# Patient Record
Sex: Female | Born: 1935 | Race: Black or African American | Hispanic: No | Marital: Single | Smoking: Former smoker
Health system: Southern US, Community
[De-identification: ages and names within clinical notes are randomized; demographics above are authoritative.]

## PROBLEM LIST (undated history)

## (undated) DIAGNOSIS — N183 Chronic kidney disease, stage 3 unspecified: Secondary | ICD-10-CM

## (undated) DIAGNOSIS — D649 Anemia, unspecified: Secondary | ICD-10-CM

## (undated) DIAGNOSIS — I10 Essential (primary) hypertension: Secondary | ICD-10-CM

## (undated) DIAGNOSIS — I639 Cerebral infarction, unspecified: Secondary | ICD-10-CM

## (undated) DIAGNOSIS — I358 Other nonrheumatic aortic valve disorders: Secondary | ICD-10-CM

## (undated) DIAGNOSIS — E7800 Pure hypercholesterolemia, unspecified: Secondary | ICD-10-CM

## (undated) DIAGNOSIS — R0600 Dyspnea, unspecified: Secondary | ICD-10-CM

## (undated) DIAGNOSIS — I5189 Other ill-defined heart diseases: Secondary | ICD-10-CM

## (undated) DIAGNOSIS — K219 Gastro-esophageal reflux disease without esophagitis: Secondary | ICD-10-CM

## (undated) DIAGNOSIS — R0609 Other forms of dyspnea: Secondary | ICD-10-CM

## (undated) DIAGNOSIS — I209 Angina pectoris, unspecified: Secondary | ICD-10-CM

## (undated) DIAGNOSIS — E119 Type 2 diabetes mellitus without complications: Secondary | ICD-10-CM

## (undated) DIAGNOSIS — H5440 Blindness, one eye, unspecified eye: Secondary | ICD-10-CM

## (undated) DIAGNOSIS — F329 Major depressive disorder, single episode, unspecified: Secondary | ICD-10-CM

## (undated) DIAGNOSIS — C029 Malignant neoplasm of tongue, unspecified: Secondary | ICD-10-CM

## (undated) DIAGNOSIS — M1990 Unspecified osteoarthritis, unspecified site: Secondary | ICD-10-CM

## (undated) DIAGNOSIS — S72009A Fracture of unspecified part of neck of unspecified femur, initial encounter for closed fracture: Secondary | ICD-10-CM

## (undated) DIAGNOSIS — F32A Depression, unspecified: Secondary | ICD-10-CM

## (undated) DIAGNOSIS — Z970 Presence of artificial eye: Secondary | ICD-10-CM

## (undated) DIAGNOSIS — F419 Anxiety disorder, unspecified: Secondary | ICD-10-CM

## (undated) HISTORY — PX: FRACTURE SURGERY: SHX138

## (undated) HISTORY — PX: TUBAL LIGATION: SHX77

## (undated) HISTORY — PX: ENUCLEATION: SHX628

## (undated) HISTORY — DX: Anemia, unspecified: D64.9

---

## 1996-01-14 HISTORY — PX: TONGUE BIOPSY: SHX1075

## 1997-01-13 DIAGNOSIS — Z970 Presence of artificial eye: Secondary | ICD-10-CM

## 1997-01-13 DIAGNOSIS — H5440 Blindness, one eye, unspecified eye: Secondary | ICD-10-CM

## 1997-01-13 HISTORY — DX: Blindness, one eye, unspecified eye: H54.40

## 1997-01-13 HISTORY — PX: INTRAOCULAR PROSTHESES INSERTION: SHX360

## 1997-01-13 HISTORY — DX: Presence of artificial eye: Z97.0

## 2005-12-05 ENCOUNTER — Ambulatory Visit: Payer: Self-pay | Admitting: Oncology

## 2005-12-19 ENCOUNTER — Ambulatory Visit: Payer: Self-pay | Admitting: Oncology

## 2006-01-19 LAB — CBC WITH DIFFERENTIAL (CANCER CENTER ONLY)
Eosinophils Absolute: 0.2 10*3/uL (ref 0.0–0.5)
HCT: 31.4 % — ABNORMAL LOW (ref 34.8–46.6)
LYMPH%: 27.6 % (ref 14.0–48.0)
MCH: 28.4 pg (ref 26.0–34.0)
MCV: 84 fL (ref 81–101)
MONO%: 5.8 % (ref 0.0–13.0)
NEUT%: 63.8 % (ref 39.6–80.0)
Platelets: 228 10*3/uL (ref 145–400)
RBC: 3.72 10*6/uL (ref 3.70–5.32)
RDW: 16 % — ABNORMAL HIGH (ref 10.5–14.6)

## 2006-01-21 LAB — LACTATE DEHYDROGENASE: LDH: 132 U/L (ref 94–250)

## 2006-01-21 LAB — COMPREHENSIVE METABOLIC PANEL
Albumin: 3.4 g/dL — ABNORMAL LOW (ref 3.5–5.2)
CO2: 25 mEq/L (ref 19–32)
Glucose, Bld: 87 mg/dL (ref 70–99)
Potassium: 3.7 mEq/L (ref 3.5–5.3)
Sodium: 137 mEq/L (ref 135–145)
Total Bilirubin: 0.5 mg/dL (ref 0.3–1.2)
Total Protein: 6.8 g/dL (ref 6.0–8.3)

## 2006-01-21 LAB — FERRITIN: Ferritin: 40 ng/mL (ref 10–291)

## 2006-01-21 LAB — PROTEIN ELECTROPHORESIS, SERUM
Albumin ELP: 49.3 % — ABNORMAL LOW (ref 55.8–66.1)
Alpha-1-Globulin: 5.2 % — ABNORMAL HIGH (ref 2.9–4.9)
Total Protein, Serum Electrophoresis: 7.7 g/dL (ref 6.0–8.3)

## 2006-01-21 LAB — RETICULOCYTES (CHCC): Retic Ct Pct: 1.4 % (ref 0.4–3.1)

## 2006-01-21 LAB — ERYTHROPOIETIN: Erythropoietin: 27.7 m[IU]/mL (ref 2.6–34.0)

## 2006-01-23 ENCOUNTER — Ambulatory Visit (HOSPITAL_COMMUNITY): Admission: RE | Admit: 2006-01-23 | Discharge: 2006-01-23 | Payer: Self-pay | Admitting: Oncology

## 2006-02-16 ENCOUNTER — Ambulatory Visit: Payer: Self-pay | Admitting: Oncology

## 2006-02-18 ENCOUNTER — Ambulatory Visit: Payer: Self-pay | Admitting: Oncology

## 2006-02-18 LAB — CBC WITH DIFFERENTIAL (CANCER CENTER ONLY)
BASO#: 0 10*3/uL (ref 0.0–0.2)
Eosinophils Absolute: 0.1 10*3/uL (ref 0.0–0.5)
HGB: 10.6 g/dL — ABNORMAL LOW (ref 11.6–15.9)
LYMPH%: 31.4 % (ref 14.0–48.0)
MCH: 28.6 pg (ref 26.0–34.0)
MCV: 84 fL (ref 81–101)
MONO#: 0.4 10*3/uL (ref 0.1–0.9)
MONO%: 6.2 % (ref 0.0–13.0)
RBC: 3.71 10*6/uL (ref 3.70–5.32)

## 2006-02-23 ENCOUNTER — Ambulatory Visit (HOSPITAL_COMMUNITY): Admission: RE | Admit: 2006-02-23 | Discharge: 2006-02-23 | Payer: Self-pay | Admitting: Oncology

## 2006-02-25 ENCOUNTER — Encounter (HOSPITAL_COMMUNITY): Admission: RE | Admit: 2006-02-25 | Discharge: 2006-04-30 | Payer: Self-pay | Admitting: Cardiology

## 2006-03-11 LAB — CBC WITH DIFFERENTIAL (CANCER CENTER ONLY)
BASO%: 0.4 % (ref 0.0–2.0)
EOS%: 1.9 % (ref 0.0–7.0)
HCT: 32.3 % — ABNORMAL LOW (ref 34.8–46.6)
LYMPH%: 26.5 % (ref 14.0–48.0)
MCHC: 33.2 g/dL (ref 32.0–36.0)
MCV: 82 fL (ref 81–101)
MONO#: 0.4 10*3/uL (ref 0.1–0.9)
MONO%: 6.5 % (ref 0.0–13.0)
NEUT%: 64.7 % (ref 39.6–80.0)
Platelets: 209 10*3/uL (ref 145–400)
RDW: 16.1 % — ABNORMAL HIGH (ref 10.5–14.6)
WBC: 6.2 10*3/uL (ref 3.9–10.0)

## 2006-04-01 ENCOUNTER — Ambulatory Visit: Payer: Self-pay | Admitting: Oncology

## 2006-04-01 LAB — CBC WITH DIFFERENTIAL (CANCER CENTER ONLY)
BASO%: 0.4 % (ref 0.0–2.0)
EOS%: 2.2 % (ref 0.0–7.0)
HCT: 29.5 % — ABNORMAL LOW (ref 34.8–46.6)
LYMPH#: 1.5 10*3/uL (ref 0.9–3.3)
MCHC: 33.3 g/dL (ref 32.0–36.0)
MONO#: 0.4 10*3/uL (ref 0.1–0.9)
NEUT#: 5 10*3/uL (ref 1.5–6.5)
NEUT%: 70.2 % (ref 39.6–80.0)
Platelets: 231 10*3/uL (ref 145–400)
RDW: 15.7 % — ABNORMAL HIGH (ref 10.5–14.6)
WBC: 7.1 10*3/uL (ref 3.9–10.0)

## 2006-04-21 ENCOUNTER — Ambulatory Visit: Payer: Self-pay | Admitting: Oncology

## 2006-04-22 ENCOUNTER — Ambulatory Visit: Payer: Self-pay | Admitting: Oncology

## 2006-04-22 LAB — CBC WITH DIFFERENTIAL (CANCER CENTER ONLY)
BASO#: 0 10*3/uL (ref 0.0–0.2)
BASO%: 0.5 % (ref 0.0–2.0)
EOS%: 2.9 % (ref 0.0–7.0)
HCT: 33.1 % — ABNORMAL LOW (ref 34.8–46.6)
HGB: 10.8 g/dL — ABNORMAL LOW (ref 11.6–15.9)
LYMPH#: 1.4 10*3/uL (ref 0.9–3.3)
MCHC: 32.5 g/dL (ref 32.0–36.0)
MONO#: 0.3 10*3/uL (ref 0.1–0.9)
NEUT#: 3.3 10*3/uL (ref 1.5–6.5)
NEUT%: 64.3 % (ref 39.6–80.0)
RBC: 4.11 10*6/uL (ref 3.70–5.32)
WBC: 5.2 10*3/uL (ref 3.9–10.0)

## 2006-05-13 LAB — CBC WITH DIFFERENTIAL (CANCER CENTER ONLY)
BASO#: 0 10*3/uL (ref 0.0–0.2)
Eosinophils Absolute: 0.2 10*3/uL (ref 0.0–0.5)
HCT: 36.9 % (ref 34.8–46.6)
HGB: 12.2 g/dL (ref 11.6–15.9)
LYMPH#: 1.5 10*3/uL (ref 0.9–3.3)
MCH: 26.9 pg (ref 26.0–34.0)
NEUT#: 4.8 10*3/uL (ref 1.5–6.5)
NEUT%: 69.3 % (ref 39.6–80.0)
RBC: 4.54 10*6/uL (ref 3.70–5.32)

## 2006-06-03 LAB — CMP (CANCER CENTER ONLY)
ALT(SGPT): 51 U/L — ABNORMAL HIGH (ref 10–47)
CO2: 28 mEq/L (ref 18–33)
Calcium: 9.1 mg/dL (ref 8.0–10.3)
Chloride: 106 mEq/L (ref 98–108)
Sodium: 143 mEq/L (ref 128–145)
Total Protein: 7.7 g/dL (ref 6.4–8.1)

## 2006-06-03 LAB — CBC WITH DIFFERENTIAL (CANCER CENTER ONLY)
BASO#: 0 10*3/uL (ref 0.0–0.2)
HCT: 38 % (ref 34.8–46.6)
HGB: 12.8 g/dL (ref 11.6–15.9)
LYMPH#: 1.5 10*3/uL (ref 0.9–3.3)
LYMPH%: 22.5 % (ref 14.0–48.0)
MCHC: 33.7 g/dL (ref 32.0–36.0)
MCV: 79 fL — ABNORMAL LOW (ref 81–101)
MONO#: 0.3 10*3/uL (ref 0.1–0.9)
NEUT%: 69.7 % (ref 39.6–80.0)
WBC: 6.5 10*3/uL (ref 3.9–10.0)

## 2006-06-03 LAB — LACTATE DEHYDROGENASE: LDH: 164 U/L (ref 94–250)

## 2006-07-01 ENCOUNTER — Ambulatory Visit: Payer: Self-pay | Admitting: Oncology

## 2006-07-08 ENCOUNTER — Emergency Department (HOSPITAL_COMMUNITY): Admission: EM | Admit: 2006-07-08 | Discharge: 2006-07-08 | Payer: Self-pay | Admitting: Emergency Medicine

## 2006-07-10 ENCOUNTER — Ambulatory Visit: Payer: Self-pay | Admitting: Internal Medicine

## 2006-07-10 ENCOUNTER — Inpatient Hospital Stay (HOSPITAL_COMMUNITY): Admission: EM | Admit: 2006-07-10 | Discharge: 2006-07-13 | Payer: Self-pay | Admitting: Emergency Medicine

## 2006-07-31 ENCOUNTER — Ambulatory Visit: Payer: Self-pay | Admitting: Oncology

## 2006-07-31 LAB — CBC WITH DIFFERENTIAL (CANCER CENTER ONLY)
BASO#: 0 10*3/uL (ref 0.0–0.2)
BASO%: 0.4 % (ref 0.0–2.0)
EOS%: 3.7 % (ref 0.0–7.0)
HCT: 34.9 % (ref 34.8–46.6)
HGB: 11.9 g/dL (ref 11.6–15.9)
LYMPH%: 28.6 % (ref 14.0–48.0)
MCH: 27.6 pg (ref 26.0–34.0)
MCHC: 34 g/dL (ref 32.0–36.0)
MONO%: 5.2 % (ref 0.0–13.0)
NEUT%: 62.1 % (ref 39.6–80.0)
RDW: 17.4 % — ABNORMAL HIGH (ref 10.5–14.6)

## 2006-07-31 LAB — IRON AND TIBC: %SAT: 18 % — ABNORMAL LOW (ref 20–55)

## 2006-08-26 ENCOUNTER — Ambulatory Visit: Payer: Self-pay | Admitting: Oncology

## 2006-08-27 LAB — CBC WITH DIFFERENTIAL (CANCER CENTER ONLY)
HGB: 11.3 g/dL — ABNORMAL LOW (ref 11.6–15.9)
MCH: 28.1 pg (ref 26.0–34.0)
Platelets: 210 10*3/uL (ref 145–400)
RBC: 4.01 10*6/uL (ref 3.70–5.32)
WBC: 7.5 10*3/uL (ref 3.9–10.0)

## 2006-08-27 LAB — MANUAL DIFFERENTIAL (CHCC SATELLITE)
ALC: 1.7 10*3/uL (ref 0.6–2.2)
ANC (CHCC HP manual diff): 5.1 10*3/uL (ref 1.5–6.7)
MONO: 7 % (ref 0–13)
Platelet Morphology: NORMAL
SEG: 67 % (ref 40–75)

## 2006-09-29 LAB — CBC WITH DIFFERENTIAL (CANCER CENTER ONLY)
BASO%: 0.6 % (ref 0.0–2.0)
Eosinophils Absolute: 0.1 10*3/uL (ref 0.0–0.5)
HCT: 38.8 % (ref 34.8–46.6)
LYMPH#: 1.8 10*3/uL (ref 0.9–3.3)
LYMPH%: 26.9 % (ref 14.0–48.0)
MCV: 85 fL (ref 81–101)
MONO#: 0.4 10*3/uL (ref 0.1–0.9)
NEUT%: 64.1 % (ref 39.6–80.0)
RBC: 4.55 10*6/uL (ref 3.70–5.32)
RDW: 15.3 % — ABNORMAL HIGH (ref 10.5–14.6)
WBC: 6.6 10*3/uL (ref 3.9–10.0)

## 2006-10-21 ENCOUNTER — Ambulatory Visit: Payer: Self-pay | Admitting: Oncology

## 2006-11-19 LAB — COMPREHENSIVE METABOLIC PANEL
ALT: 45 U/L — ABNORMAL HIGH (ref 0–35)
Albumin: 3.9 g/dL (ref 3.5–5.2)
CO2: 23 mEq/L (ref 19–32)
Calcium: 9.5 mg/dL (ref 8.4–10.5)
Chloride: 108 mEq/L (ref 96–112)
Creatinine, Ser: 1.28 mg/dL — ABNORMAL HIGH (ref 0.40–1.20)
Potassium: 3.8 mEq/L (ref 3.5–5.3)
Total Protein: 7.6 g/dL (ref 6.0–8.3)

## 2006-11-19 LAB — CBC WITH DIFFERENTIAL (CANCER CENTER ONLY)
EOS%: 2.2 % (ref 0.0–7.0)
Eosinophils Absolute: 0.1 10*3/uL (ref 0.0–0.5)
LYMPH#: 1.8 10*3/uL (ref 0.9–3.3)
MCH: 28.4 pg (ref 26.0–34.0)
MONO%: 6.1 % (ref 0.0–13.0)
NEUT#: 4.1 10*3/uL (ref 1.5–6.5)
Platelets: 205 10*3/uL (ref 145–400)
RBC: 4.37 10*6/uL (ref 3.70–5.32)

## 2006-11-19 LAB — LACTATE DEHYDROGENASE: LDH: 152 U/L (ref 94–250)

## 2006-11-19 LAB — IRON AND TIBC: UIBC: 272 ug/dL

## 2006-12-16 ENCOUNTER — Ambulatory Visit: Payer: Self-pay | Admitting: Oncology

## 2006-12-17 LAB — CBC WITH DIFFERENTIAL (CANCER CENTER ONLY)
Eosinophils Absolute: 0.2 10*3/uL (ref 0.0–0.5)
MCV: 83 fL (ref 81–101)
MONO#: 0.3 10*3/uL (ref 0.1–0.9)
NEUT#: 4.3 10*3/uL (ref 1.5–6.5)
Platelets: 201 10*3/uL (ref 145–400)
RBC: 4.36 10*6/uL (ref 3.70–5.32)
WBC: 6.8 10*3/uL (ref 3.9–10.0)

## 2007-02-09 ENCOUNTER — Ambulatory Visit: Payer: Self-pay | Admitting: Oncology

## 2007-02-11 LAB — CBC WITH DIFFERENTIAL (CANCER CENTER ONLY)
BASO#: 0.1 10*3/uL (ref 0.0–0.2)
BASO%: 0.6 % (ref 0.0–2.0)
EOS%: 2.5 % (ref 0.0–7.0)
HGB: 11.4 g/dL — ABNORMAL LOW (ref 11.6–15.9)
MCH: 29.6 pg (ref 26.0–34.0)
MCHC: 33.8 g/dL (ref 32.0–36.0)
MONO%: 6.3 % (ref 0.0–13.0)
NEUT#: 5.3 10*3/uL (ref 1.5–6.5)
Platelets: 231 10*3/uL (ref 145–400)
RDW: 15.4 % — ABNORMAL HIGH (ref 10.5–14.6)

## 2007-03-12 LAB — CBC WITH DIFFERENTIAL (CANCER CENTER ONLY)
BASO#: 0 10*3/uL (ref 0.0–0.2)
Eosinophils Absolute: 0.2 10*3/uL (ref 0.0–0.5)
HGB: 11.6 g/dL (ref 11.6–15.9)
LYMPH#: 2 10*3/uL (ref 0.9–3.3)
MCH: 28.4 pg (ref 26.0–34.0)
MONO#: 0.4 10*3/uL (ref 0.1–0.9)
NEUT#: 4 10*3/uL (ref 1.5–6.5)
RBC: 4.11 10*6/uL (ref 3.70–5.32)
WBC: 6.6 10*3/uL (ref 3.9–10.0)

## 2007-03-24 ENCOUNTER — Encounter: Admission: RE | Admit: 2007-03-24 | Discharge: 2007-03-24 | Payer: Self-pay | Admitting: Family Medicine

## 2007-04-06 ENCOUNTER — Ambulatory Visit: Payer: Self-pay | Admitting: Oncology

## 2007-05-07 LAB — CBC WITH DIFFERENTIAL (CANCER CENTER ONLY)
BASO#: 0 10e3/uL (ref 0.0–0.2)
BASO%: 0.5 % (ref 0.0–2.0)
EOS%: 1.6 % (ref 0.0–7.0)
Eosinophils Absolute: 0.1 10e3/uL (ref 0.0–0.5)
HCT: 39.6 % (ref 34.8–46.6)
HGB: 13.1 g/dL (ref 11.6–15.9)
LYMPH#: 1.3 10e3/uL (ref 0.9–3.3)
LYMPH%: 32 % (ref 14.0–48.0)
MCH: 27.6 pg (ref 26.0–34.0)
MCHC: 33.1 g/dL (ref 32.0–36.0)
MCV: 83 fL (ref 81–101)
MONO#: 0.7 10e3/uL (ref 0.1–0.9)
MONO%: 16.6 % — ABNORMAL HIGH (ref 0.0–13.0)
NEUT#: 2 10e3/uL (ref 1.5–6.5)
NEUT%: 49.3 % (ref 39.6–80.0)
Platelets: 218 10e3/uL (ref 145–400)
RBC: 4.76 10e6/uL (ref 3.70–5.32)
RDW: 16 % — ABNORMAL HIGH (ref 10.5–14.6)
WBC: 4.1 10e3/uL (ref 3.9–10.0)

## 2007-05-10 ENCOUNTER — Encounter: Admission: RE | Admit: 2007-05-10 | Discharge: 2007-05-10 | Payer: Self-pay | Admitting: Family Medicine

## 2007-06-02 ENCOUNTER — Ambulatory Visit: Payer: Self-pay | Admitting: Oncology

## 2007-06-11 ENCOUNTER — Encounter: Admission: RE | Admit: 2007-06-11 | Discharge: 2007-06-11 | Payer: Self-pay | Admitting: Family Medicine

## 2007-07-01 LAB — COMPREHENSIVE METABOLIC PANEL
Albumin: 4.4 g/dL (ref 3.5–5.2)
Alkaline Phosphatase: 78 U/L (ref 39–117)
Glucose, Bld: 138 mg/dL — ABNORMAL HIGH (ref 70–99)
Potassium: 3.8 mEq/L (ref 3.5–5.3)
Sodium: 139 mEq/L (ref 135–145)
Total Protein: 8.1 g/dL (ref 6.0–8.3)

## 2007-07-01 LAB — CBC WITH DIFFERENTIAL (CANCER CENTER ONLY)
Eosinophils Absolute: 0.1 10*3/uL (ref 0.0–0.5)
HCT: 35.8 % (ref 34.8–46.6)
LYMPH%: 28.6 % (ref 14.0–48.0)
MCV: 81 fL (ref 81–101)
MONO#: 0.4 10*3/uL (ref 0.1–0.9)
NEUT%: 62.6 % (ref 39.6–80.0)
RBC: 4.4 10*6/uL (ref 3.70–5.32)
WBC: 6.6 10*3/uL (ref 3.9–10.0)

## 2007-07-01 LAB — IRON AND TIBC: UIBC: 254 ug/dL

## 2007-07-29 ENCOUNTER — Ambulatory Visit: Payer: Self-pay | Admitting: Oncology

## 2007-08-03 LAB — CBC WITH DIFFERENTIAL (CANCER CENTER ONLY)
BASO%: 0.7 % (ref 0.0–2.0)
EOS%: 2.6 % (ref 0.0–7.0)
HCT: 34 % — ABNORMAL LOW (ref 34.8–46.6)
LYMPH%: 31.9 % (ref 14.0–48.0)
MCH: 29.2 pg (ref 26.0–34.0)
MCHC: 34.9 g/dL (ref 32.0–36.0)
MCV: 84 fL (ref 81–101)
NEUT%: 59.2 % (ref 39.6–80.0)
RDW: 17.4 % — ABNORMAL HIGH (ref 10.5–14.6)

## 2007-08-27 LAB — CBC WITH DIFFERENTIAL (CANCER CENTER ONLY)
BASO%: 0.6 % (ref 0.0–2.0)
EOS%: 2 % (ref 0.0–7.0)
HCT: 39.5 % (ref 34.8–46.6)
LYMPH#: 1.8 10*3/uL (ref 0.9–3.3)
MCHC: 33.6 g/dL (ref 32.0–36.0)
NEUT#: 4.6 10*3/uL (ref 1.5–6.5)
NEUT%: 65.7 % (ref 39.6–80.0)
RDW: 15.5 % — ABNORMAL HIGH (ref 10.5–14.6)

## 2007-09-22 ENCOUNTER — Ambulatory Visit: Payer: Self-pay | Admitting: Oncology

## 2007-10-01 LAB — CBC WITH DIFFERENTIAL (CANCER CENTER ONLY)
BASO%: 0.7 % (ref 0.0–2.0)
Eosinophils Absolute: 0.1 10*3/uL (ref 0.0–0.5)
LYMPH%: 31.2 % (ref 14.0–48.0)
MCV: 87 fL (ref 81–101)
MONO#: 0.5 10*3/uL (ref 0.1–0.9)
MONO%: 7.1 % (ref 0.0–13.0)
NEUT#: 3.8 10*3/uL (ref 1.5–6.5)
Platelets: 209 10*3/uL (ref 145–400)
RBC: 4.2 10*6/uL (ref 3.70–5.32)
RDW: 14.6 % (ref 10.5–14.6)
WBC: 6.4 10*3/uL (ref 3.9–10.0)

## 2007-10-22 LAB — CBC WITH DIFFERENTIAL (CANCER CENTER ONLY)
BASO%: 0.9 % (ref 0.0–2.0)
EOS%: 2.5 % (ref 0.0–7.0)
Eosinophils Absolute: 0.2 10*3/uL (ref 0.0–0.5)
LYMPH%: 29.5 % (ref 14.0–48.0)
MCH: 29.8 pg (ref 26.0–34.0)
MCHC: 34.1 g/dL (ref 32.0–36.0)
MCV: 87 fL (ref 81–101)
MONO%: 6.4 % (ref 0.0–13.0)
NEUT#: 4.1 10*3/uL (ref 1.5–6.5)
Platelets: 212 10*3/uL (ref 145–400)
RBC: 4.2 10*6/uL (ref 3.70–5.32)
RDW: 14.7 % — ABNORMAL HIGH (ref 10.5–14.6)

## 2007-11-17 ENCOUNTER — Ambulatory Visit: Payer: Self-pay | Admitting: Oncology

## 2007-11-23 LAB — CBC WITH DIFFERENTIAL (CANCER CENTER ONLY)
EOS%: 2.5 % (ref 0.0–7.0)
HCT: 36.6 % (ref 34.8–46.6)
LYMPH#: 2.2 10*3/uL (ref 0.9–3.3)
MONO#: 0.6 10*3/uL (ref 0.1–0.9)
MONO%: 5.8 % (ref 0.0–13.0)
Platelets: 226 10*3/uL (ref 145–400)
RDW: 14.9 % — ABNORMAL HIGH (ref 10.5–14.6)
WBC: 9.6 10*3/uL (ref 3.9–10.0)

## 2007-12-17 LAB — CBC WITH DIFFERENTIAL (CANCER CENTER ONLY)
EOS%: 2.7 % (ref 0.0–7.0)
Eosinophils Absolute: 0.2 10*3/uL (ref 0.0–0.5)
LYMPH%: 26.4 % (ref 14.0–48.0)
MCH: 29.2 pg (ref 26.0–34.0)
MCHC: 34 g/dL (ref 32.0–36.0)
MCV: 86 fL (ref 81–101)
MONO%: 5.7 % (ref 0.0–13.0)
Platelets: 213 10*3/uL (ref 145–400)
RBC: 3.97 10*6/uL (ref 3.70–5.32)
RDW: 15 % — ABNORMAL HIGH (ref 10.5–14.6)

## 2008-01-13 ENCOUNTER — Ambulatory Visit: Payer: Self-pay | Admitting: Oncology

## 2008-01-17 LAB — CBC WITH DIFFERENTIAL (CANCER CENTER ONLY)
BASO#: 0.1 10*3/uL (ref 0.0–0.2)
EOS%: 2.3 % (ref 0.0–7.0)
Eosinophils Absolute: 0.2 10*3/uL (ref 0.0–0.5)
HGB: 12.8 g/dL (ref 11.6–15.9)
LYMPH%: 35.4 % (ref 14.0–48.0)
MCH: 29.8 pg (ref 26.0–34.0)
MCHC: 33.5 g/dL (ref 32.0–36.0)
MCV: 89 fL (ref 81–101)
MONO%: 6.1 % (ref 0.0–13.0)
NEUT%: 55.4 % (ref 39.6–80.0)
RBC: 4.3 10*6/uL (ref 3.70–5.32)

## 2008-02-11 LAB — CBC WITH DIFFERENTIAL (CANCER CENTER ONLY)
BASO%: 0.9 % (ref 0.0–2.0)
LYMPH%: 24.4 % (ref 14.0–48.0)
MCV: 87 fL (ref 81–101)
MONO#: 0.6 10*3/uL (ref 0.1–0.9)
MONO%: 6 % (ref 0.0–13.0)
NEUT#: 6.4 10*3/uL (ref 1.5–6.5)
Platelets: 189 10*3/uL (ref 145–400)
RDW: 14.5 % (ref 10.5–14.6)
WBC: 9.6 10*3/uL (ref 3.9–10.0)

## 2008-03-03 ENCOUNTER — Ambulatory Visit (HOSPITAL_BASED_OUTPATIENT_CLINIC_OR_DEPARTMENT_OTHER): Admission: RE | Admit: 2008-03-03 | Discharge: 2008-03-03 | Payer: Self-pay | Admitting: Otolaryngology

## 2008-03-03 ENCOUNTER — Encounter (INDEPENDENT_AMBULATORY_CARE_PROVIDER_SITE_OTHER): Payer: Self-pay | Admitting: Otolaryngology

## 2008-03-10 ENCOUNTER — Ambulatory Visit: Payer: Self-pay | Admitting: Oncology

## 2008-03-21 LAB — CBC WITH DIFFERENTIAL (CANCER CENTER ONLY)
BASO#: 0 10*3/uL (ref 0.0–0.2)
Eosinophils Absolute: 0.2 10*3/uL (ref 0.0–0.5)
HCT: 36.2 % (ref 34.8–46.6)
HGB: 12.3 g/dL (ref 11.6–15.9)
LYMPH#: 1.9 10*3/uL (ref 0.9–3.3)
MCH: 29.2 pg (ref 26.0–34.0)
MCHC: 34.1 g/dL (ref 32.0–36.0)
NEUT#: 4 10*3/uL (ref 1.5–6.5)
NEUT%: 61.7 % (ref 39.6–80.0)
RBC: 4.22 10*6/uL (ref 3.70–5.32)

## 2008-03-21 LAB — BASIC METABOLIC PANEL - CANCER CENTER ONLY
BUN, Bld: 19 mg/dL (ref 7–22)
CO2: 26 mEq/L (ref 18–33)
Chloride: 101 mEq/L (ref 98–108)
Creat: 1.4 mg/dl — ABNORMAL HIGH (ref 0.6–1.2)
Glucose, Bld: 141 mg/dL — ABNORMAL HIGH (ref 73–118)

## 2008-03-21 LAB — IRON AND TIBC: TIBC: 308 ug/dL (ref 250–470)

## 2008-05-11 ENCOUNTER — Ambulatory Visit: Payer: Self-pay | Admitting: Oncology

## 2008-05-23 LAB — CBC WITH DIFFERENTIAL (CANCER CENTER ONLY)
BASO%: 1 % (ref 0.0–2.0)
EOS%: 1.2 % (ref 0.0–7.0)
LYMPH#: 1.6 10*3/uL (ref 0.9–3.3)
MCHC: 34.5 g/dL (ref 32.0–36.0)
MONO#: 0.6 10*3/uL (ref 0.1–0.9)
NEUT#: 14.2 10*3/uL — ABNORMAL HIGH (ref 1.5–6.5)
Platelets: 284 10*3/uL (ref 145–400)
RDW: 15 % — ABNORMAL HIGH (ref 10.5–14.6)
WBC: 16.7 10*3/uL — ABNORMAL HIGH (ref 3.9–10.0)

## 2008-05-28 ENCOUNTER — Emergency Department (HOSPITAL_COMMUNITY): Admission: EM | Admit: 2008-05-28 | Discharge: 2008-05-28 | Payer: Self-pay | Admitting: Emergency Medicine

## 2008-05-29 ENCOUNTER — Inpatient Hospital Stay (HOSPITAL_COMMUNITY): Admission: EM | Admit: 2008-05-29 | Discharge: 2008-06-01 | Payer: Self-pay | Admitting: Emergency Medicine

## 2008-06-14 LAB — CBC WITH DIFFERENTIAL (CANCER CENTER ONLY)
BASO%: 0.5 % (ref 0.0–2.0)
EOS%: 2.8 % (ref 0.0–7.0)
LYMPH%: 28.1 % (ref 14.0–48.0)
MCH: 30.4 pg (ref 26.0–34.0)
MCHC: 35.2 g/dL (ref 32.0–36.0)
MCV: 86 fL (ref 81–101)
MONO#: 0.3 10*3/uL (ref 0.1–0.9)
MONO%: 6.1 % (ref 0.0–13.0)
NEUT%: 62.5 % (ref 39.6–80.0)
Platelets: 190 10*3/uL (ref 145–400)
RDW: 14.8 % — ABNORMAL HIGH (ref 10.5–14.6)
WBC: 5.1 10*3/uL (ref 3.9–10.0)

## 2008-07-07 ENCOUNTER — Ambulatory Visit: Payer: Self-pay | Admitting: Oncology

## 2008-07-12 LAB — CBC WITH DIFFERENTIAL (CANCER CENTER ONLY)
BASO#: 0.1 10*3/uL (ref 0.0–0.2)
BASO%: 1 % (ref 0.0–2.0)
EOS%: 3 % (ref 0.0–7.0)
HCT: 39.9 % (ref 34.8–46.6)
HGB: 13.6 g/dL (ref 11.6–15.9)
LYMPH#: 2.1 10*3/uL (ref 0.9–3.3)
MCHC: 34.1 g/dL (ref 32.0–36.0)
MONO#: 0.4 10*3/uL (ref 0.1–0.9)
NEUT#: 3.4 10*3/uL (ref 1.5–6.5)
NEUT%: 55.1 % (ref 39.6–80.0)
WBC: 6.2 10*3/uL (ref 3.9–10.0)

## 2008-08-07 ENCOUNTER — Ambulatory Visit: Payer: Self-pay | Admitting: Oncology

## 2008-08-08 LAB — CBC WITH DIFFERENTIAL (CANCER CENTER ONLY)
Eosinophils Absolute: 0.2 10*3/uL (ref 0.0–0.5)
HCT: 37.8 % (ref 34.8–46.6)
LYMPH%: 27.3 % (ref 14.0–48.0)
MCH: 30.1 pg (ref 26.0–34.0)
MCV: 85 fL (ref 81–101)
MONO%: 7 % (ref 0.0–13.0)
NEUT%: 61.8 % (ref 39.6–80.0)
Platelets: 190 10*3/uL (ref 145–400)
RBC: 4.44 10*6/uL (ref 3.70–5.32)
RDW: 14.9 % — ABNORMAL HIGH (ref 10.5–14.6)

## 2008-09-01 ENCOUNTER — Ambulatory Visit: Payer: Self-pay | Admitting: Oncology

## 2008-09-06 LAB — CBC WITH DIFFERENTIAL (CANCER CENTER ONLY)
BASO#: 0 10*3/uL (ref 0.0–0.2)
Eosinophils Absolute: 0.2 10*3/uL (ref 0.0–0.5)
HCT: 38.1 % (ref 34.8–46.6)
HGB: 13.3 g/dL (ref 11.6–15.9)
LYMPH%: 26 % (ref 14.0–48.0)
MCH: 29.6 pg (ref 26.0–34.0)
MCV: 85 fL (ref 81–101)
MONO#: 0.4 10*3/uL (ref 0.1–0.9)
MONO%: 5.8 % (ref 0.0–13.0)
NEUT%: 64.8 % (ref 39.6–80.0)
RBC: 4.49 10*6/uL (ref 3.70–5.32)
WBC: 6.2 10*3/uL (ref 3.9–10.0)

## 2008-09-06 LAB — BASIC METABOLIC PANEL - CANCER CENTER ONLY
BUN, Bld: 15 mg/dL (ref 7–22)
Calcium: 9.5 mg/dL (ref 8.0–10.3)
Creat: 0.9 mg/dl (ref 0.6–1.2)
Glucose, Bld: 82 mg/dL (ref 73–118)

## 2008-09-06 LAB — IRON AND TIBC: Iron: 84 ug/dL (ref 42–145)

## 2008-09-06 LAB — FERRITIN: Ferritin: 362 ng/mL — ABNORMAL HIGH (ref 10–291)

## 2008-11-13 ENCOUNTER — Encounter: Admission: RE | Admit: 2008-11-13 | Discharge: 2008-11-13 | Payer: Self-pay | Admitting: Family Medicine

## 2008-12-12 ENCOUNTER — Ambulatory Visit: Payer: Self-pay | Admitting: Oncology

## 2008-12-22 ENCOUNTER — Ambulatory Visit (HOSPITAL_COMMUNITY): Admission: RE | Admit: 2008-12-22 | Discharge: 2008-12-22 | Payer: Self-pay | Admitting: Family Medicine

## 2009-01-11 ENCOUNTER — Ambulatory Visit: Payer: Self-pay | Admitting: Oncology

## 2009-01-16 LAB — CBC WITH DIFFERENTIAL (CANCER CENTER ONLY)
BASO%: 0.6 % (ref 0.0–2.0)
EOS%: 3.1 % (ref 0.0–7.0)
LYMPH#: 2.6 10*3/uL (ref 0.9–3.3)
MCH: 30.4 pg (ref 26.0–34.0)
MCV: 91 fL (ref 81–101)
NEUT#: 4.4 10*3/uL (ref 1.5–6.5)
Platelets: 233 10*3/uL (ref 145–400)
RBC: 4.22 10*6/uL (ref 3.70–5.32)
RDW: 13.2 % (ref 10.5–14.6)
WBC: 7.8 10*3/uL (ref 3.9–10.0)

## 2009-02-19 ENCOUNTER — Encounter: Admission: RE | Admit: 2009-02-19 | Discharge: 2009-02-19 | Payer: Self-pay | Admitting: Otolaryngology

## 2009-02-20 ENCOUNTER — Ambulatory Visit (HOSPITAL_BASED_OUTPATIENT_CLINIC_OR_DEPARTMENT_OTHER): Admission: RE | Admit: 2009-02-20 | Discharge: 2009-02-20 | Payer: Self-pay | Admitting: Otolaryngology

## 2009-02-20 ENCOUNTER — Ambulatory Visit: Payer: Self-pay | Admitting: Oncology

## 2009-02-28 LAB — CBC WITH DIFFERENTIAL (CANCER CENTER ONLY)
EOS%: 3 % (ref 0.0–7.0)
Eosinophils Absolute: 0.3 10*3/uL (ref 0.0–0.5)
HCT: 37.5 % (ref 34.8–46.6)
HGB: 12.5 g/dL (ref 11.6–15.9)
LYMPH#: 3 10*3/uL (ref 0.9–3.3)
LYMPH%: 35.5 % (ref 14.0–48.0)
MCH: 29.9 pg (ref 26.0–34.0)
MCHC: 33.4 g/dL (ref 32.0–36.0)
MCV: 90 fL (ref 81–101)
MONO%: 5 % (ref 0.0–13.0)
NEUT%: 55.9 % (ref 39.6–80.0)
Platelets: 229 10*3/uL (ref 145–400)
WBC: 8.5 10*3/uL (ref 3.9–10.0)

## 2009-06-05 ENCOUNTER — Inpatient Hospital Stay (HOSPITAL_COMMUNITY): Admission: EM | Admit: 2009-06-05 | Discharge: 2009-06-07 | Payer: Self-pay | Admitting: Emergency Medicine

## 2009-06-07 ENCOUNTER — Ambulatory Visit: Payer: Self-pay | Admitting: Vascular Surgery

## 2009-06-07 ENCOUNTER — Encounter (INDEPENDENT_AMBULATORY_CARE_PROVIDER_SITE_OTHER): Payer: Self-pay | Admitting: Internal Medicine

## 2009-06-26 ENCOUNTER — Ambulatory Visit: Payer: Self-pay | Admitting: Oncology

## 2009-06-28 LAB — CBC WITH DIFFERENTIAL (CANCER CENTER ONLY)
BASO#: 0 10*3/uL (ref 0.0–0.2)
HCT: 35.4 % (ref 34.8–46.6)
HGB: 12 g/dL (ref 11.6–15.9)
LYMPH%: 32.9 % (ref 14.0–48.0)
MCH: 30.8 pg (ref 26.0–34.0)
MCV: 91 fL (ref 81–101)
NEUT#: 3.7 10*3/uL (ref 1.5–6.5)
NEUT%: 55.6 % (ref 39.6–80.0)
Platelets: 233 10*3/uL (ref 145–400)
RBC: 3.89 10*6/uL (ref 3.70–5.32)
WBC: 6.7 10*3/uL (ref 3.9–10.0)

## 2009-10-12 ENCOUNTER — Observation Stay (HOSPITAL_COMMUNITY)
Admission: EM | Admit: 2009-10-12 | Discharge: 2009-10-15 | Payer: Self-pay | Source: Home / Self Care | Admitting: Emergency Medicine

## 2009-10-12 ENCOUNTER — Ambulatory Visit: Payer: Self-pay | Admitting: Internal Medicine

## 2009-12-25 ENCOUNTER — Ambulatory Visit: Payer: Self-pay | Admitting: Oncology

## 2010-03-11 ENCOUNTER — Encounter: Payer: PRIVATE HEALTH INSURANCE | Admitting: Oncology

## 2010-03-25 ENCOUNTER — Encounter (HOSPITAL_BASED_OUTPATIENT_CLINIC_OR_DEPARTMENT_OTHER): Payer: PRIVATE HEALTH INSURANCE | Admitting: Oncology

## 2010-03-25 ENCOUNTER — Other Ambulatory Visit: Payer: Self-pay | Admitting: Oncology

## 2010-03-25 DIAGNOSIS — Z85810 Personal history of malignant neoplasm of tongue: Secondary | ICD-10-CM

## 2010-03-25 DIAGNOSIS — N289 Disorder of kidney and ureter, unspecified: Secondary | ICD-10-CM

## 2010-03-25 DIAGNOSIS — D638 Anemia in other chronic diseases classified elsewhere: Secondary | ICD-10-CM

## 2010-03-25 LAB — CBC WITH DIFFERENTIAL/PLATELET
BASO%: 0.5 % (ref 0.0–2.0)
Basophils Absolute: 0 10*3/uL (ref 0.0–0.1)
EOS%: 2.6 % (ref 0.0–7.0)
Eosinophils Absolute: 0.2 10*3/uL (ref 0.0–0.5)
HCT: 37.9 % (ref 34.8–46.6)
HGB: 12.8 g/dL (ref 11.6–15.9)
LYMPH%: 28.8 % (ref 14.0–49.7)
MONO%: 6.5 % (ref 0.0–14.0)
NEUT#: 4.1 10*3/uL (ref 1.5–6.5)
NEUT%: 61.6 % (ref 38.4–76.8)
Platelets: 205 10*3/uL (ref 145–400)
nRBC: 0 % (ref 0–0)

## 2010-03-28 LAB — CBC
HCT: 37.6 % (ref 36.0–46.0)
HCT: 38.2 % (ref 36.0–46.0)
Hemoglobin: 12.7 g/dL (ref 12.0–15.0)
MCH: 29.6 pg (ref 26.0–34.0)
MCHC: 32.7 g/dL (ref 30.0–36.0)
MCHC: 33.4 g/dL (ref 30.0–36.0)
MCHC: 33.8 g/dL (ref 30.0–36.0)
MCV: 86.8 fL (ref 78.0–100.0)
MCV: 89.3 fL (ref 78.0–100.0)
Platelets: 203 10*3/uL (ref 150–400)
Platelets: 209 10*3/uL (ref 150–400)
RDW: 14.8 % (ref 11.5–15.5)
RDW: 15 % (ref 11.5–15.5)

## 2010-03-28 LAB — PROTIME-INR: Prothrombin Time: 13.1 seconds (ref 11.6–15.2)

## 2010-03-28 LAB — BASIC METABOLIC PANEL
BUN: 6 mg/dL (ref 6–23)
BUN: 8 mg/dL (ref 6–23)
Chloride: 104 mEq/L (ref 96–112)
Chloride: 104 mEq/L (ref 96–112)
GFR calc non Af Amer: >60 mL/min (ref >60)
GFR calc non Af Amer: >60 mL/min (ref >60)
GFR calc non Af Amer: >60 mL/min (ref >60)
Glucose, Bld: 118 mg/dL — ABNORMAL HIGH (ref 70–99)
Glucose, Bld: 121 mg/dL — ABNORMAL HIGH (ref 70–99)
Glucose, Bld: 98 mg/dL (ref 70–99)
Potassium: 3.4 mEq/L — ABNORMAL LOW (ref 3.5–5.1)
Potassium: 3.8 mEq/L (ref 3.5–5.1)
Potassium: 3.9 mEq/L (ref 3.5–5.1)
Sodium: 137 mEq/L (ref 135–145)
Sodium: 138 mEq/L (ref 135–145)
Sodium: 141 mEq/L (ref 135–145)

## 2010-03-28 LAB — CK TOTAL AND CKMB (NOT AT ARMC)
CK, MB: 0.9 ng/mL (ref 0.3–4.0)
Total CK: 48 U/L (ref 7–177)

## 2010-03-28 LAB — TSH: TSH: 0.87 u[IU]/mL (ref 0.350–4.500)

## 2010-03-28 LAB — URINALYSIS, ROUTINE W REFLEX MICROSCOPIC
Bilirubin Urine: NEGATIVE
Hgb urine dipstick: NEGATIVE
Specific Gravity, Urine: 1.01 (ref 1.005–1.030)
Urobilinogen, UA: 0.2 mg/dL (ref 0.0–1.0)
pH: 5 (ref 5.0–8.0)

## 2010-03-28 LAB — COMPREHENSIVE METABOLIC PANEL
AST: 42 U/L — ABNORMAL HIGH (ref 0–37)
Albumin: 3.4 g/dL — ABNORMAL LOW (ref 3.5–5.2)
CO2: 27 mEq/L (ref 19–32)
Calcium: 9.4 mg/dL (ref 8.4–10.5)
Creatinine, Ser: 0.95 mg/dL (ref 0.4–1.2)
GFR calc Af Amer: >60 mL/min (ref >60)
GFR calc non Af Amer: 57 mL/min — ABNORMAL LOW (ref >60)
Total Protein: 6.9 g/dL (ref 6.0–8.3)

## 2010-03-28 LAB — GLUCOSE, CAPILLARY
Glucose-Capillary: 102 mg/dL — ABNORMAL HIGH (ref 70–99)
Glucose-Capillary: 118 mg/dL — ABNORMAL HIGH (ref 70–99)
Glucose-Capillary: 132 mg/dL — ABNORMAL HIGH (ref 70–99)
Glucose-Capillary: 132 mg/dL — ABNORMAL HIGH (ref 70–99)
Glucose-Capillary: 139 mg/dL — ABNORMAL HIGH (ref 70–99)
Glucose-Capillary: 154 mg/dL — ABNORMAL HIGH (ref 70–99)
Glucose-Capillary: 181 mg/dL — ABNORMAL HIGH (ref 70–99)
Glucose-Capillary: 92 mg/dL (ref 70–99)

## 2010-03-28 LAB — HEMOGLOBIN A1C: Mean Plasma Glucose: 128 mg/dL — ABNORMAL HIGH (ref <117)

## 2010-03-28 LAB — CARDIAC PANEL(CRET KIN+CKTOT+MB+TROPI): Troponin I: 0.01 ng/mL (ref 0.00–0.06)

## 2010-03-28 LAB — POCT I-STAT, CHEM 8
Creatinine, Ser: 1.1 mg/dL (ref 0.4–1.2)
Glucose, Bld: 73 mg/dL (ref 70–99)
Hemoglobin: 14.3 g/dL (ref 12.0–15.0)
TCO2: 26 mmol/L (ref 0–100)

## 2010-03-28 LAB — DIFFERENTIAL
Eosinophils Relative: 1 % (ref 0–5)
Lymphocytes Relative: 20 % (ref 12–46)
Lymphs Abs: 1.7 10*3/uL (ref 0.7–4.0)

## 2010-04-01 LAB — DIFFERENTIAL
Basophils Relative: 0 % (ref 0–1)
Eosinophils Absolute: 0.1 10*3/uL (ref 0.0–0.7)
Monocytes Absolute: 0.4 10*3/uL (ref 0.1–1.0)
Neutrophils Relative %: 72 % (ref 43–77)

## 2010-04-01 LAB — LIPID PANEL
Cholesterol: 181 mg/dL (ref 0–200)
HDL: 55 mg/dL (ref >39)
LDL Cholesterol: 76 mg/dL (ref 0–99)
Total CHOL/HDL Ratio: 3.3 RATIO

## 2010-04-01 LAB — COMPREHENSIVE METABOLIC PANEL
ALT: 36 U/L — ABNORMAL HIGH (ref 0–35)
Alkaline Phosphatase: 75 U/L (ref 39–117)
BUN: 13 mg/dL (ref 6–23)
CO2: 22 mEq/L (ref 19–32)
CO2: 25 mEq/L (ref 19–32)
Calcium: 9 mg/dL (ref 8.4–10.5)
GFR calc non Af Amer: 52 mL/min — ABNORMAL LOW (ref >60)
GFR calc non Af Amer: 53 mL/min — ABNORMAL LOW (ref >60)
Glucose, Bld: 54 mg/dL — ABNORMAL LOW (ref 70–99)
Glucose, Bld: 94 mg/dL (ref 70–99)
Potassium: 3.9 mEq/L (ref 3.5–5.1)
Sodium: 141 mEq/L (ref 135–145)
Total Protein: 6.4 g/dL (ref 6.0–8.3)

## 2010-04-01 LAB — D-DIMER, QUANTITATIVE
D-Dimer, Quant: 0.68 ug/mL-FEU — ABNORMAL HIGH (ref 0.00–0.48)
D-Dimer, Quant: 0.75 ug/mL-FEU — ABNORMAL HIGH (ref 0.00–0.48)

## 2010-04-01 LAB — GLUCOSE, CAPILLARY
Glucose-Capillary: 112 mg/dL — ABNORMAL HIGH (ref 70–99)
Glucose-Capillary: 174 mg/dL — ABNORMAL HIGH (ref 70–99)
Glucose-Capillary: 53 mg/dL — ABNORMAL LOW (ref 70–99)
Glucose-Capillary: 96 mg/dL (ref 70–99)

## 2010-04-01 LAB — URINALYSIS, ROUTINE W REFLEX MICROSCOPIC
Hgb urine dipstick: NEGATIVE
Nitrite: NEGATIVE
Specific Gravity, Urine: 1.011 (ref 1.005–1.030)
Urobilinogen, UA: 0.2 mg/dL (ref 0.0–1.0)

## 2010-04-01 LAB — CBC
HCT: 34.1 % — ABNORMAL LOW (ref 36.0–46.0)
HCT: 36.4 % (ref 36.0–46.0)
Hemoglobin: 11.4 g/dL — ABNORMAL LOW (ref 12.0–15.0)
Hemoglobin: 13.9 g/dL (ref 12.0–15.0)
MCHC: 33.5 g/dL (ref 30.0–36.0)
MCHC: 34.4 g/dL (ref 30.0–36.0)
MCV: 92 fL (ref 78.0–100.0)
MCV: 92.2 fL (ref 78.0–100.0)
MCV: 92.7 fL (ref 78.0–100.0)
Platelets: 207 10*3/uL (ref 150–400)
RBC: 3.68 MIL/uL — ABNORMAL LOW (ref 3.87–5.11)
RBC: 4.38 MIL/uL (ref 3.87–5.11)
RDW: 14.8 % (ref 11.5–15.5)
RDW: 14.9 % (ref 11.5–15.5)
WBC: 6.8 10*3/uL (ref 4.0–10.5)

## 2010-04-01 LAB — BASIC METABOLIC PANEL
BUN: 12 mg/dL (ref 6–23)
Chloride: 105 mEq/L (ref 96–112)
Creatinine, Ser: 1.04 mg/dL (ref 0.4–1.2)
Glucose, Bld: 99 mg/dL (ref 70–99)
Potassium: 4.7 mEq/L (ref 3.5–5.1)

## 2010-04-01 LAB — CARDIAC PANEL(CRET KIN+CKTOT+MB+TROPI)
Relative Index: INVALID (ref 0.0–2.5)
Relative Index: INVALID (ref 0.0–2.5)
Total CK: 80 U/L (ref 7–177)

## 2010-04-01 LAB — PROTIME-INR: INR: 0.94 (ref 0.00–1.49)

## 2010-04-01 LAB — URINE CULTURE

## 2010-04-01 LAB — HEMOGLOBIN A1C: Mean Plasma Glucose: 117 mg/dL — ABNORMAL HIGH (ref <117)

## 2010-04-01 LAB — SEDIMENTATION RATE: Sed Rate: 30 mm/hr — ABNORMAL HIGH (ref 0–22)

## 2010-04-01 LAB — APTT: aPTT: 26 seconds (ref 24–37)

## 2010-04-03 LAB — BASIC METABOLIC PANEL
BUN: 18 mg/dL (ref 6–23)
CO2: 27 mEq/L (ref 19–32)
Calcium: 9.1 mg/dL (ref 8.4–10.5)
Chloride: 103 mEq/L (ref 96–112)
Creatinine, Ser: 1.09 mg/dL (ref 0.4–1.2)
GFR calc Af Amer: 59 mL/min — ABNORMAL LOW (ref >60)
Glucose, Bld: 99 mg/dL (ref 70–99)

## 2010-04-03 LAB — GLUCOSE, CAPILLARY
Glucose-Capillary: 69 mg/dL — ABNORMAL LOW (ref 70–99)
Glucose-Capillary: 97 mg/dL (ref 70–99)

## 2010-04-16 LAB — LIPID PANEL
Cholesterol: 155 mg/dL (ref 0–200)
LDL Cholesterol: 69 mg/dL (ref 0–99)
Triglycerides: 92 mg/dL (ref <150)
VLDL: 18 mg/dL (ref 0–40)

## 2010-04-16 LAB — COMPREHENSIVE METABOLIC PANEL
Albumin: 3.7 g/dL (ref 3.5–5.2)
Alkaline Phosphatase: 69 U/L (ref 39–117)
BUN: 12 mg/dL (ref 6–23)
CO2: 26 mEq/L (ref 19–32)
Chloride: 101 mEq/L (ref 96–112)
Creatinine, Ser: 1.06 mg/dL (ref 0.4–1.2)
GFR calc non Af Amer: 51 mL/min — ABNORMAL LOW (ref >60)
Potassium: 3.7 mEq/L (ref 3.5–5.1)
Total Bilirubin: 0.4 mg/dL (ref 0.3–1.2)

## 2010-04-16 LAB — HEMOGLOBIN A1C: Mean Plasma Glucose: 111 mg/dL

## 2010-04-16 LAB — MICROALBUMIN, URINE: Microalb, Ur: 0.89 mg/dL (ref 0.00–1.89)

## 2010-04-23 LAB — DIFFERENTIAL
Basophils Absolute: 0 10*3/uL (ref 0.0–0.1)
Basophils Relative: 0 % (ref 0–1)
Eosinophils Relative: 1 % (ref 0–5)
Lymphocytes Relative: 13 % (ref 12–46)
Lymphs Abs: 1.7 10*3/uL (ref 0.7–4.0)
Monocytes Absolute: 0.2 10*3/uL (ref 0.1–1.0)
Monocytes Relative: 5 % (ref 3–12)
Neutro Abs: 7.6 10*3/uL (ref 1.7–7.7)
Neutro Abs: 8.6 10*3/uL — ABNORMAL HIGH (ref 1.7–7.7)
Neutrophils Relative %: 76 % (ref 43–77)

## 2010-04-23 LAB — COMPREHENSIVE METABOLIC PANEL
Albumin: 3.4 g/dL — ABNORMAL LOW (ref 3.5–5.2)
Albumin: 3 g/dL — ABNORMAL LOW (ref 3.5–5.2)
BUN: 19 mg/dL (ref 6–23)
BUN: 11 mg/dL (ref 6–23)
Calcium: 9.2 mg/dL (ref 8.4–10.5)
Calcium: 8.8 mg/dL (ref 8.4–10.5)
Chloride: 104 mEq/L (ref 96–112)
Creatinine, Ser: 1.17 mg/dL (ref 0.4–1.2)
GFR calc Af Amer: 55 mL/min — ABNORMAL LOW (ref >60)
Glucose, Bld: 55 mg/dL — ABNORMAL LOW (ref 70–99)
Sodium: 137 mEq/L (ref 135–145)
Total Bilirubin: 0.6 mg/dL (ref 0.3–1.2)
Total Protein: 6.9 g/dL (ref 6.0–8.3)
Total Protein: 5.8 g/dL — ABNORMAL LOW (ref 6.0–8.3)

## 2010-04-23 LAB — CBC
HCT: 36.9 % (ref 36.0–46.0)
HCT: 33.5 % — ABNORMAL LOW (ref 36.0–46.0)
HCT: 34.9 % — ABNORMAL LOW (ref 36.0–46.0)
HCT: 37 % (ref 36.0–46.0)
Hemoglobin: 12.6 g/dL (ref 12.0–15.0)
Hemoglobin: 11.9 g/dL — ABNORMAL LOW (ref 12.0–15.0)
Hemoglobin: 12.7 g/dL (ref 12.0–15.0)
MCHC: 34.3 g/dL (ref 30.0–36.0)
MCHC: 33.5 g/dL (ref 30.0–36.0)
MCHC: 34 g/dL (ref 30.0–36.0)
MCHC: 34.4 g/dL (ref 30.0–36.0)
MCV: 91.5 fL (ref 78.0–100.0)
Platelets: 237 10*3/uL (ref 150–400)
Platelets: 216 10*3/uL (ref 150–400)
Platelets: 222 10*3/uL (ref 150–400)
Platelets: 247 10*3/uL (ref 150–400)
RDW: 15 % (ref 11.5–15.5)
RDW: 15.1 % (ref 11.5–15.5)
RDW: 15.1 % (ref 11.5–15.5)
RDW: 15.4 % (ref 11.5–15.5)

## 2010-04-23 LAB — BASIC METABOLIC PANEL
BUN: 18 mg/dL (ref 6–23)
BUN: 7 mg/dL (ref 6–23)
CO2: 24 mEq/L (ref 19–32)
CO2: 26 mEq/L (ref 19–32)
Calcium: 9.2 mg/dL (ref 8.4–10.5)
Calcium: 9.5 mg/dL (ref 8.4–10.5)
GFR calc non Af Amer: 52 mL/min — ABNORMAL LOW (ref >60)
Glucose, Bld: 109 mg/dL — ABNORMAL HIGH (ref 70–99)
Glucose, Bld: 123 mg/dL — ABNORMAL HIGH (ref 70–99)
Sodium: 138 mEq/L (ref 135–145)
Sodium: 138 mEq/L (ref 135–145)

## 2010-04-23 LAB — GLUCOSE, CAPILLARY
Glucose-Capillary: 107 mg/dL — ABNORMAL HIGH (ref 70–99)
Glucose-Capillary: 120 mg/dL — ABNORMAL HIGH (ref 70–99)
Glucose-Capillary: 122 mg/dL — ABNORMAL HIGH (ref 70–99)
Glucose-Capillary: 126 mg/dL — ABNORMAL HIGH (ref 70–99)
Glucose-Capillary: 93 mg/dL (ref 70–99)

## 2010-04-23 LAB — URINE MICROSCOPIC-ADD ON

## 2010-04-23 LAB — URINALYSIS, ROUTINE W REFLEX MICROSCOPIC
Bilirubin Urine: NEGATIVE
Hgb urine dipstick: NEGATIVE
Ketones, ur: NEGATIVE mg/dL
Nitrite: NEGATIVE
Nitrite: NEGATIVE
Protein, ur: NEGATIVE mg/dL
Specific Gravity, Urine: 1.008 (ref 1.005–1.030)
Specific Gravity, Urine: 1.011 (ref 1.005–1.030)
Urobilinogen, UA: 0.2 mg/dL (ref 0.0–1.0)
Urobilinogen, UA: 1 mg/dL (ref 0.0–1.0)
pH: 6 (ref 5.0–8.0)
pH: 6.5 (ref 5.0–8.0)

## 2010-04-23 LAB — POCT CARDIAC MARKERS
CKMB, poc: <1 ng/mL — ABNORMAL LOW (ref 1.0–8.0)
Myoglobin, poc: 79.9 ng/mL (ref 12–200)
Troponin i, poc: <0.05 ng/mL (ref 0.00–0.09)

## 2010-04-23 LAB — URINE CULTURE
Colony Count: 15000
Special Requests: NEGATIVE

## 2010-04-23 LAB — PHOSPHORUS: Phosphorus: 3.1 mg/dL (ref 2.3–4.6)

## 2010-04-23 LAB — MAGNESIUM: Magnesium: 2 mg/dL (ref 1.5–2.5)

## 2010-04-23 LAB — HEMOGLOBIN A1C: Mean Plasma Glucose: 111 mg/dL

## 2010-04-30 LAB — COMPREHENSIVE METABOLIC PANEL
ALT: 31 U/L (ref 0–35)
Alkaline Phosphatase: 77 U/L (ref 39–117)
BUN: 10 mg/dL (ref 6–23)
CO2: 26 mEq/L (ref 19–32)
GFR calc non Af Amer: 43 mL/min — ABNORMAL LOW (ref >60)
Glucose, Bld: 73 mg/dL (ref 70–99)
Potassium: 3.7 mEq/L (ref 3.5–5.1)
Sodium: 134 mEq/L — ABNORMAL LOW (ref 135–145)

## 2010-04-30 LAB — GLUCOSE, CAPILLARY: Glucose-Capillary: 109 mg/dL — ABNORMAL HIGH (ref 70–99)

## 2010-05-28 NOTE — H&P (Signed)
NAMEFAYOLA, [name]            ACCOUNT NO.:  [id number]   MEDICAL RECORD NO.:  [id number]          PATIENT TYPE:  INP   LOCATION:  0115                         FACILITY:  [facility]   PHYSICIAN:  Estelle June, MD       DATE OF BIRTH:  Feb 26, 1932   DATE OF ADMISSION:  05/29/2008  DATE OF DISCHARGE:                              HISTORY & PHYSICAL   REASON FOR ADMISSION:  Recurrent hypoglycemia.   HISTORY OF PRESENT ILLNESS:  This is a 77 year old African American  female who has been diabetic for 15 years coming in with recurrent  hypoglycemia per the patient and the family.  She was here yesterday at  [facility] ER since her sugars were found to be in the 38s.  The patient  was discharged without any change in the medications.  The patient tells  me that her sugars have been running over the last few days.  Today in  the morning, her sugar was 39.  She did not take her medications.  According to the family member, the patient was found on the floor by a  sales person who was visiting her, and that is how EMS was called.  Per  EMS report, fingerstick was in the 20s.  The patient tells me that there  have been no recent changes in her medication dose, that she does live  by herself and takes her pill on her own, admits to doing it correctly,  has been eating well.  Denies any nausea, vomiting, fevers, chills,  shortness of breath, dizziness, diarrhea or dysuria.  The patient did  see her PCP recently.  However, she did not mention the problem to the  PCP since sugars were not running that low.  According to the computer,  the patient had low sugars on a chemistry in February 2010 as well.  However, there is no note accompanying the chemistry besides surgical  note which was for checking hoarseness of her voice and doing  microlaryngoscopy.   PAST MEDICAL HISTORY:  Diabetes, hypertension and bradycardia, mets.  The list needs to be reconciled in the morning.   FAMILY HISTORY:  Diabetes  and hypertension in the family.   SOCIAL HISTORY:  She does live by herself.  No alcohol or drugs. No  smoking for the last 8 years.  The patient did smoke prior to that.   ALLERGIES:  No known allergies.   PHYSICAL EXAMINATION:  VITAL SIGNS:  Blood pressure ranging from 102/70  to 132/69, pulse 76-64, respiration 14-20.  The patient is saturating  100% on room air.  GENERAL:  She is in no acute distress.  HEENT:  She does have bilateral cataracts with corneal opacities, no  icterus or pallor appreciated.  NECK:  No JVD appreciated.  LUNGS:  Fair to auscultation.  CARDIOVASCULAR:  Regular rate and rhythm.  ABDOMEN:  Soft.  LOWER EXTREMITIES:  No edema appreciated.   ASSESSMENT AND PLAN:  Admit the patient to the regular floor.  Monitor  sugars closely.  Allow her to eat.  Continue the D10.  We will check  sugars frequently until they  stabilize.  We will also check a hemoglobin  A1c.  When the patient is stable and ready for discharge, her p.o.  diabetic medications need to be decreased, and she needs to have primary  care physician followup.  Will hold glipizide, alprazolam, Cardizem and  Evoxac at this time.      Estelle June, MD  Electronically Signed     RP/MEDQ  D:  05/29/2008  T:  05/29/2008  Job:  [id number]

## 2010-05-28 NOTE — Discharge Summary (Signed)
[name], [name]            ACCOUNT NO.:  [id number]   MEDICAL RECORD NO.:  [id number]          PATIENT TYPE:  INP   LOCATION:  3729                         FACILITY:  [facility]   PHYSICIAN:  C. Milta Deiters, M.D.DATE OF BIRTH:  1932-07-04   DATE OF ADMISSION:  07/10/2006  DATE OF DISCHARGE:  07/13/2006                               DISCHARGE SUMMARY   CONTINUITY DOCTOR:  Dr. Blunt   CONSULTS:  None   DISCHARGE DIAGNOSES:  Would be:  1. Pyelonephritis, partially treated.  2. Anemia of chronic disease with a baseline hemoglobin of 9.9 to      10.8.  3. Diarrhea.  4. Hyperlipidemia.  5. Diabetes type 2.  6. Hypertension.  7. Bradycardia.   DISCHARGE MEDICATIONS:  1. Zocor 40 mg daily.  2. Citrulline 40 mg b.i.d.  3. Xanax 0.5 mg b.i.d.  4. Coreg 6.25 b.i.d.  5. Maxidex ophthalmic solution b.i.d.  6. Cipro 500 mg b.i.d. for seven more days.  7. Hydrochlorothiazide/lisinopril 10/12.5 mg daily.  8. Glipizide 5 mg daily.  9. Tandem as previously taken.  10.Multivitamins daily.  11.Aranesp once every three weeks.   PROCEDURES PERFORMED:  None.   BRIEF HISTORY OF PRESENT ILLNESS:  Ms. Shubin is a 77 year old  African-American female with a past medical history of tongue cancer,  hypertension, diabetes type 2, recently treated on Wednesday with Cipro,  comes to the ED because of fever, abdominal pain, nausea, vomiting and  diarrhea for the past four days.  The pain is mostly in the epigastric  area and radiates to the back and to the lower neck.  Patient went to an  urgent care two days prior to admission and was given Cipro, but the  pain continued.  It is made better by sitting up and is made worse by  lying down.  She describes it as diffuse and constant.  She has had four  episodes in the last 24 hours prior to admission.  She also refers she  has nausea and vomiting, and some palpitations.  She denies any  shortness of breath, chest pain, dizziness, or change in  vision.   LABS ON THE DAYS OF DISCHARGE:  Her temperature was 96.5, blood pressure  138/73, a pulse of 81, respiration 18, she was sating 99% on room air.   LABS ON ADMISSION:  Sodium was 130, potassium 3.6, chloride 97, bicarb  22, BUN of 30, creatinine 1.3 and glucose of 65.  Her white blood cells  were 9.2, hemoglobin of 12.8 and platelets 205.  Her UA had 11-20 white  blood cells and moderate bacteria, positive leukocyte esterase and  cloudy urine.   When she came to the ED on Wednesday, a urine culture was taken and grew  E. Coli.   HOSPITAL COURSE:  1. Pyelo.  Patient was admitted to the hospital due to recurrent      nausea and vomiting and her diarrhea.  She was given Phenergan.      She was hydrated then switched to IV Cipro twice a day.  By the      next day, her white count decreased  a little bit.  She was not      having any more nausea, but was still having diarrhea.  She was      continued on Phenergan and IV antibiotics.  By the second day of      admission, her diarrhea had resolved.  She was not having any      nausea and was tolerating diet well.  So, she was started on oral      medication and on the day of admission, she was afebrile and white      blood cells were within normal limits.  2. Anemia of chronic disease.  This was monitored daily making sure it      did not drop.  On the day of discharge, her hemoglobin was 10.8.  3. Diarrhea.  Patient was having continuous diarrhea.  This was      thought to be secondary to the pyelo.  She was treated with IV      fluids and was not put NPO; the diarrhea resolved the second day      and patient was tolerating orals.  We offered the patient some      Imodium for the diarrhea and she refused it, and day of discharge      she did not have any bowel movement on that day.  She related her      diarrhea had stopped.  4. Hyperlipidemia.  We continued her p.r.n. home meds.  5. Diabetes type 2.  Her glucose was well  controlled with glipizide 5      mg b.i.d., this is half her home dose, this was probably due      because the patient was not taking her medication at home.  We will      continue this current medication and she will follow up with her      primary care physician which arrangements will be made.  6. Acute renal failure.  This was thought to be secondarily to #1.      She was treated with IV fluids and her acute renal failure      resolved.  7. Bradycardia.  This was thought to be because of Coreg.  Patient was      not taking most likely Coreg and her Diltiazem at home so her      Diltiazem was stopped and she was kept on half the dose of Coreg.      By the day of discharge, her pulse was 78.  I would not consider      restarting her medication, especially her Coreg because she had      episode of bradycardia.  Her Diltiazem will not be continued      because she most likely was not taking it at home.  After      restarting all of her medications here she went into bradycardia,      so these arrangements were made and she was controlled with a pulse      of 78 just on half of her dose of Coreg.  8. Hypertension.  Diltiazem and clonidine were held because patient's      blood pressure was a little resolved.  She was only controlled on      hydrochlorothiazide and lisinopril and Coreg.  This was probably      due because patient was not taking her medication at home.   On the day of discharge, her white count was 7.4, hemoglobin was  10.8,  platelets were 256, sodium 141, potassium 3.6, chloride 107, bicarb 26,  BUN of 4, creatinine 0.6, glucose 125, calcium 8.0.   VITALS ON THE DAY OF DISCHARGE:  Temperature 97.4, blood pressure  189/95, pulse of 78, respiration 20, she was sating 98% on room air.      Renato Shin, M.D.  Electronically Signed    CSR/MEDQ  D:  07/13/2006  T:  07/13/2006  Job:  [id number]

## 2010-05-28 NOTE — Op Note (Signed)
[name], [name]            ACCOUNT NO.:  [id number]   MEDICAL RECORD NO.:  [id number]          PATIENT TYPE:  AMB   LOCATION:  [place]                          FACILITY:  [facility]   PHYSICIAN:  Christopher E. Lucia Gaskins, M.D.DATE OF BIRTH:  [birth year]   DATE OF PROCEDURE:  03/03/2008  DATE OF DISCHARGE:                               OPERATIVE REPORT   PREOPERATIVE DIAGNOSIS:  Chronic hoarseness with vocal cord changes.   POSTOPERATIVE DIAGNOSIS:  Chronic hoarseness with vocal cord changes.   OPERATION PERFORMED:  Microlaryngoscopy with biopsy of left and right  vocal cord irregularities.   SURGEON:  Leonides Sake. Lucia Gaskins, MD   ANESTHESIA:  General endotracheal.   COMPLICATIONS:  None.   CLINICAL NOTE:  [name] is a 77 year old female who has had  previous history of tongue cancer, status post radiation therapy in [place] in 1999.  She has had chronic hoarseness over the last 3-4 months  and on evaluation in the office she has some chronic inflammatory  changes of the vocal cords, left side worse than right, with diffuse  supraglottic edema from her previous radiation therapy.  She is taken to  the operating room at this time for microlaryngoscopy and biopsy of  vocal cord irregularity inflammatory changes.   DESCRIPTION OF PROCEDURE:  After adequate endotracheal anesthesia with a  5.5 endotracheal tube, direct laryngoscopy was performed.  The base of  tongue, vallecular area were clear.  On palpation on the base of tongue,  she has slight fullness but everything was soft with no discrete ulcers  or palpable nodules.  The AV folds were clear.  Piriform sinuses were  clear bilaterally.  The laryngoscope was then used to visualize the  false and true cords and was suspended.  Dynastee had some irregularity  predominately at the left true vocal cord anteriorly and midportion.  The right true vocal cord had some slight irregularity, but was not as  pronounced as the left.   The left cord was also a little bit more firm  to palpation with the cup forceps.  A biopsy of the irregularity was  performed on the left side x3.  A single biopsy of the right true vocal  cord in midportion was performed.  This completed procedure.  Chetana was  awoke from anesthesia and transferred to the recovery room, postop doing  well.   DISPOSITION:  [name] is discharged home later this morning on her  regular medications along with Tylenol and Vicodin p.r.n. pain.  We will  have her follow up in the office in 1 week to review pathology of  biopsies.           ______________________________  Leonides Sake Lucia Gaskins, M.D.    CEN/MEDQ  D:  03/03/2008  T:  03/03/2008  Job:  [id number]   cc:   Alan Mulder, MD

## 2010-05-28 NOTE — Discharge Summary (Signed)
[name], [name]            ACCOUNT NO.:  [id number]   MEDICAL RECORD NO.:  [id number]          PATIENT TYPE:  INP   LOCATION:  1510                         FACILITY:  [facility]   PHYSICIAN:  Vernell Leep, MD     DATE OF BIRTH:  [birth year]   DATE OF ADMISSION:  05/29/2008  DATE OF DISCHARGE:  06/01/2008                               DISCHARGE SUMMARY   PRIMARY MEDICAL DOCTOR:  Dr. Lindwood Qua, [place] # [phone].   DISCHARGE DIAGNOSES:  1. Recurrent hypoglycemia, secondary to oral hypoglycemic medications.      Resolved.  Medications currently on hold.  2. Type 2 diabetes mellitus.  3. Hypertension.  4. Sinus bradycardia secondary to medications.  5. Anxiety and depression.  6. Gastroesophageal reflux disease.  7. Hyperlipidemia.  8. History of anemia of chronic disease.  9. History of hepatitis B.  10.History of blindness in the left eye.   DISCHARGE MEDICATIONS:  1. Simvastatin 40 mg p.o. daily.  2. Clonidine 0.1 mg p.o. b.i.d.  3. Lisinopril/hydrochlorothiazide 10/12.5 mg, one tablet p.o. daily.  4. Carvedilol 25 mg p.o. b.i.d.  5. Amlodipine 10 mg p.o. daily.  6. Omeprazole 20 mg p.o. b.i.d.  7. Sertraline 50 mg p.o. b.i.d.  8. Exovac 30 mg p.o. t.i.d.  9. Alprazolam 0.5 mg p.o. b.i.d.  10.Aspirin  81 mg p.o. daily.   MEDICATIONS THAT ARE DISCONTINUED:  1. Fluconazole  2. Prednisone  3. Diazide.   MEDICATIONS CURRENTLY ON HOLD:  Glipizide.   PROCEDURES:  None.   PERTINENT LABS:  Basic metabolic panel today within normal limits.  BUN  7, creatinine 0.77.  CBC within normal limits, with hemoglobin 12.7,  hematocrit 37.  Urine culture 15,000 colonies per mL.  Sample seems  contaminated.  Hemoglobin A1c of 5.5.  Hepatic panel unremarkable,  except for albumin of 3.  Urinalysis with few bacteria and 11-20 white  blood cells per high-power field.  Point-of-care cardiac markers on  admission were negative.   CONSULTATIONS:  None.   HOSPITAL COURSE AND  PATIENT DISPOSITION:  [name] is a 77 year old  African American female patient with history of type 2 diabetes,  hypertension, bradycardia, hyperlipidemia, gastroesophageal reflux  disease; who presented with recurrent hypoglycemia, according to the  patient and the family.  She had been seen at [facility] emergency room  with blood sugars in the 50s.  She was then discharged without any  change in her medications.  However, she again had hypoglycemic episodes  and actually was found on the floor by a salesperson; and EMS brought  her in with CBG of 20 mg/dL.  She denied any changes to her medications  and indicated that her diet had been unchanged also.  She was thereby  admitted for further evaluation and management.   1. Recurrent hypoglycemia:  Secondary to oral hypoglycemic      medications, that is glipizide.  The patient was admitted to the      hospital.  Her CBGs were frequently checked.  Her oral hypoglycemic      agents were obviously held.Marland Kitchen  She was placed on D10 water.  With  these measures, the glucoses stabilized and actually started      increasing, following which her dextrose water was discontinued.      The patient currently is on only sliding scale insulin. requiring a      unit of insulin infrequently.  Her CBGs range between 95 mg/dL to      126 mg/dL.  She will be discharged without any hypoglycemic agents.      She has been educated to check her CBGs t.i.d., a.c. and h.s..  She      has been advised to follow up with her primary medical doctor in      the next 3-5 days.  However, she has also been advised to call her      primary MD for CBGs that go beyond 180 mg/dL.  Will have a home      health RN follow her regarding compliance with medications and CBG      checks.  The patient verbalizes understanding to all of this.  2. Type 2 diabetes mellitus:  Management as per problem #1.      Eventually if her blood sugars start to rise, she may require       either very low doses of hypoglycemic agent or none; and she may be      able to diet control.  This has to be evaluated as an outpatient.      Obviously her glycemic control as an outpatient has been very tight      with A1c of 5.5.  3. Hypertension:  The patient's blood pressures are well controlled.      Her Cardizem was held and her heart rate range is in the 70s      predominantly.  Will continue her amlodipine, clonidine and      carvedilol.   The patient at this time is stable for discharge.  She is ambulating in  the room, tolerating her feeds and has no complaints, and has stable  vital signs.   TIME TAKEN COORDINATING DISCHARGE:  45 minutes.      Vernell Leep, MD  Electronically Signed     AH/MEDQ  D:  06/01/2008  T:  06/01/2008  Job:  [id number]   cc:   FAX # [phone] Dr. Lindwood Qua

## 2010-10-30 LAB — URINE CULTURE: Colony Count: 2000

## 2010-10-30 LAB — I-STAT 8, (EC8 V) (CONVERTED LAB)
BUN: 19
Chloride: 103
Glucose, Bld: 63 — ABNORMAL LOW
Hemoglobin: 13.9
Potassium: 3.6
Sodium: 135
pH, Ven: 7.456 — ABNORMAL HIGH

## 2010-10-30 LAB — BASIC METABOLIC PANEL
CO2: 24
CO2: 26
Calcium: 8.3 — ABNORMAL LOW
Calcium: 8.5
Calcium: 8.8
Chloride: 107
Creatinine, Ser: 0.8
Creatinine, Ser: 0.97
GFR calc Af Amer: >60
GFR calc Af Amer: >60
GFR calc Af Amer: >60
Glucose, Bld: 88
Sodium: 141

## 2010-10-30 LAB — POCT URINALYSIS DIP (DEVICE)
Glucose, UA: NEGATIVE
Nitrite: POSITIVE — AB
Operator id: 239701
Protein, ur: 100 — AB
Specific Gravity, Urine: 1.015
Urobilinogen, UA: >=8

## 2010-10-30 LAB — URINALYSIS, ROUTINE W REFLEX MICROSCOPIC
Glucose, UA: NEGATIVE
Ketones, ur: NEGATIVE
Nitrite: NEGATIVE
Protein, ur: NEGATIVE
Urobilinogen, UA: 1

## 2010-10-30 LAB — CBC
HCT: 36.7
HCT: 38.9
Hemoglobin: 10.8 — ABNORMAL LOW
Hemoglobin: 12.8
MCHC: 33
MCHC: 33
MCHC: 33
MCV: 82.4
MCV: 83.1
RBC: 3.6 — ABNORMAL LOW
RBC: 3.63 — ABNORMAL LOW
RBC: 3.95
RBC: 4.45
RBC: 4.67
RDW: 21.5 — ABNORMAL HIGH
RDW: 22.1 — ABNORMAL HIGH
WBC: 12.2 — ABNORMAL HIGH
WBC: 6.1

## 2010-10-30 LAB — CULTURE, BLOOD (ROUTINE X 2): Culture: NO GROWTH

## 2010-10-30 LAB — COMPREHENSIVE METABOLIC PANEL
ALT: 27
Alkaline Phosphatase: 56
BUN: 30 — ABNORMAL HIGH
CO2: 22
GFR calc non Af Amer: 38 — ABNORMAL LOW
Glucose, Bld: 65 — ABNORMAL LOW
Potassium: 3.6
Sodium: 130 — ABNORMAL LOW
Total Bilirubin: 0.9

## 2010-10-30 LAB — DIFFERENTIAL
Basophils Absolute: 0
Eosinophils Absolute: 0
Eosinophils Absolute: 0
Eosinophils Relative: 0
Lymphocytes Relative: 8 — ABNORMAL LOW
Lymphs Abs: 1.2
Monocytes Absolute: 0.7
Monocytes Relative: 8
Neutrophils Relative %: 84 — ABNORMAL HIGH

## 2010-10-30 LAB — MAGNESIUM: Magnesium: 1.9

## 2010-10-30 LAB — CK TOTAL AND CKMB (NOT AT ARMC): Total CK: 52

## 2010-10-30 LAB — POCT I-STAT CREATININE: Operator id: 247071

## 2010-10-30 LAB — URINE MICROSCOPIC-ADD ON

## 2011-02-28 ENCOUNTER — Ambulatory Visit (HOSPITAL_COMMUNITY)
Admission: RE | Admit: 2011-02-28 | Discharge: 2011-02-28 | Disposition: A | Discharge status: Home / Self Care | Payer: PRIVATE HEALTH INSURANCE | Source: Ambulatory Visit | Attending: Family Medicine | Admitting: Family Medicine

## 2011-02-28 ENCOUNTER — Other Ambulatory Visit (HOSPITAL_COMMUNITY): Payer: Self-pay | Admitting: Family Medicine

## 2011-02-28 DIAGNOSIS — R079 Chest pain, unspecified: Secondary | ICD-10-CM | POA: Insufficient documentation

## 2011-02-28 DIAGNOSIS — R05 Cough: Secondary | ICD-10-CM | POA: Insufficient documentation

## 2011-02-28 DIAGNOSIS — R059 Cough, unspecified: Secondary | ICD-10-CM | POA: Insufficient documentation

## 2011-02-28 DIAGNOSIS — R6883 Chills (without fever): Secondary | ICD-10-CM | POA: Insufficient documentation

## 2011-02-28 LAB — COMPREHENSIVE METABOLIC PANEL
AST: 31 U/L (ref 0–37)
CO2: 26 mEq/L (ref 19–32)
Calcium: 9.8 mg/dL (ref 8.4–10.5)
Creatinine, Ser: 0.92 mg/dL (ref 0.50–1.10)
GFR calc Af Amer: 68 mL/min — ABNORMAL LOW (ref >90)
GFR calc non Af Amer: 59 mL/min — ABNORMAL LOW (ref >90)

## 2011-02-28 LAB — TSH: TSH: 1.215 u[IU]/mL (ref 0.350–4.500)

## 2011-02-28 LAB — CBC
MCH: 29.9 pg (ref 26.0–34.0)
MCV: 89.8 fL (ref 78.0–100.0)
Platelets: 212 10*3/uL (ref 150–400)
RBC: 4.31 MIL/uL (ref 3.87–5.11)

## 2011-02-28 LAB — LIPID PANEL
Cholesterol: 198 mg/dL (ref 0–200)
HDL: 71 mg/dL (ref >39)
LDL Cholesterol: 94 mg/dL (ref 0–99)
Triglycerides: 167 mg/dL — ABNORMAL HIGH (ref <150)

## 2011-02-28 LAB — PRO B NATRIURETIC PEPTIDE: Pro B Natriuretic peptide (BNP): 286.5 pg/mL (ref 0–450)

## 2011-03-17 ENCOUNTER — Telehealth: Payer: Self-pay | Admitting: Oncology

## 2011-03-17 NOTE — Telephone Encounter (Signed)
S/w pt re appts for 3/14 and 3/21.

## 2011-03-27 ENCOUNTER — Other Ambulatory Visit (HOSPITAL_BASED_OUTPATIENT_CLINIC_OR_DEPARTMENT_OTHER): Payer: PRIVATE HEALTH INSURANCE | Admitting: Lab

## 2011-03-27 DIAGNOSIS — D638 Anemia in other chronic diseases classified elsewhere: Secondary | ICD-10-CM

## 2011-03-27 DIAGNOSIS — D649 Anemia, unspecified: Secondary | ICD-10-CM

## 2011-03-27 DIAGNOSIS — N289 Disorder of kidney and ureter, unspecified: Secondary | ICD-10-CM

## 2011-03-27 LAB — CBC WITH DIFFERENTIAL/PLATELET
Basophils Absolute: 0 10*3/uL (ref 0.0–0.1)
Eosinophils Absolute: 0.2 10*3/uL (ref 0.0–0.5)
HCT: 37.5 % (ref 34.8–46.6)
HGB: 12.5 g/dL (ref 11.6–15.9)
MCH: 30.7 pg (ref 25.1–34.0)
MONO#: 0.5 10*3/uL (ref 0.1–0.9)
NEUT#: 4.2 10*3/uL (ref 1.5–6.5)
NEUT%: 55.6 % (ref 38.4–76.8)
RDW: 14.7 % — ABNORMAL HIGH (ref 11.2–14.5)
WBC: 7.6 10*3/uL (ref 3.9–10.3)
lymph#: 2.6 10*3/uL (ref 0.9–3.3)

## 2011-03-27 LAB — IRON AND TIBC
%SAT: 18 % — ABNORMAL LOW (ref 20–55)
TIBC: 342 ug/dL (ref 250–470)
UIBC: 280 ug/dL (ref 125–400)

## 2011-03-27 LAB — FERRITIN: Ferritin: 323 ng/mL — ABNORMAL HIGH (ref 10–291)

## 2011-04-03 ENCOUNTER — Ambulatory Visit (HOSPITAL_BASED_OUTPATIENT_CLINIC_OR_DEPARTMENT_OTHER): Payer: PRIVATE HEALTH INSURANCE | Admitting: Oncology

## 2011-04-03 ENCOUNTER — Telehealth: Payer: Self-pay | Admitting: *Deleted

## 2011-04-03 ENCOUNTER — Encounter: Payer: Self-pay | Admitting: Oncology

## 2011-04-03 VITALS — BP 173/76 | HR 69 | Temp 97.8°F | Ht 58.5 in | Wt 141.9 lb

## 2011-04-03 DIAGNOSIS — N189 Chronic kidney disease, unspecified: Secondary | ICD-10-CM

## 2011-04-03 DIAGNOSIS — D649 Anemia, unspecified: Secondary | ICD-10-CM | POA: Insufficient documentation

## 2011-04-03 DIAGNOSIS — N039 Chronic nephritic syndrome with unspecified morphologic changes: Secondary | ICD-10-CM

## 2011-04-03 DIAGNOSIS — Z85810 Personal history of malignant neoplasm of tongue: Secondary | ICD-10-CM

## 2011-04-03 DIAGNOSIS — D631 Anemia in chronic kidney disease: Secondary | ICD-10-CM

## 2011-04-03 HISTORY — DX: Anemia, unspecified: D64.9

## 2011-04-03 NOTE — Telephone Encounter (Signed)
gave patient appointment for 03-2012 printed out calendar and gave to the patient 

## 2011-04-03 NOTE — Progress Notes (Signed)
OFFICE PROGRESS NOTE  CC  Kayla Palau, MD, MD Individual [name] [id number]  DIAGNOSIS: 78 year old female with anemia secondary to chronic disease and renal insufficiency. Patient also has a history of CVA of the tongue diagnosed in 1999 no evidence of recurrent disease.  PRIOR THERAPY:  #1 patient underwent treatment for CO2 time in 1999 in [place].  #2 patient has been receiving Aranesp injections to keep her hematocrit above 36%.  CURRENT THERAPY:Erin asked to keep her hemoglobin at or above 11  INTERVAL HISTORY: [name] 78 y.o. female returns for followup visit today her last visit with me was back in March 2012. Clinically she seems to be doing well she is without any significant complaints. She denies any fevers chills night sweats headaches shortness of breath chest pains palpitations no difficulty in swallowing. She has no bleeding problems. Remainder of the 10 point review of systems is negative.  MEDICAL HISTORY: Past Medical History  Diagnosis Date  . Anemia 04/03/2011    ALLERGIES:   has no known allergies.  MEDICATIONS:  Current Outpatient Prescriptions  Medication Sig Dispense Refill  . aspirin 81 MG tablet Take 81 mg by mouth daily.      . carvedilol (COREG) 3.125 MG tablet Take 3.125 mg by mouth 2 (two) times daily with a meal.      . felodipine (PLENDIL) 2.5 MG 24 hr tablet Take 2.5 mg by mouth 2 (two) times daily.      . furosemide (LASIX) 20 MG tablet Take 20 mg by mouth 2 (two) times daily.      Marland Kitchen lisinopril (PRINIVIL,ZESTRIL) 20 MG tablet Take 20 mg by mouth daily.      . Multiple Vitamin (MULTIVITAMIN) tablet Take 1 tablet by mouth daily.      Marland Kitchen omeprazole (PRILOSEC) 20 MG capsule Take 20 mg by mouth daily.      . simvastatin (ZOCOR) 40 MG tablet Take 40 mg by mouth every evening.        SURGICAL HISTORY: History reviewed. No pertinent past surgical history.  REVIEW OF SYSTEMS:  Pertinent items are noted in  HPI.   PHYSICAL EXAMINATION: General appearance: alert, cooperative and appears stated age Neck: no adenopathy, no carotid bruit, no JVD, supple, symmetrical, trachea midline and thyroid not enlarged, symmetric, no tenderness/mass/nodules Lymph nodes: Cervical, supraclavicular, and axillary nodes normal. Resp: clear to auscultation bilaterally Back: symmetric, no curvature. ROM normal. No CVA tenderness. Cardio: regular rate and rhythm, S1, S2 normal, no murmur, click, rub or gallop GI: soft, non-tender; bowel sounds normal; no masses,  no organomegaly Extremities: extremities normal, atraumatic, no cyanosis or edema Neurologic: Grossly normal  ECOG PERFORMANCE STATUS: 1 - Symptomatic but completely ambulatory  Blood pressure 173/76, pulse 69, temperature 97.8 F (36.6 C), temperature source Oral, height 4' 10.5" (1.486 m), weight 141 lb 14.4 oz (64.365 kg).  LABORATORY DATA: Lab Results  Component Value Date   WBC 7.6 03/27/2011   HGB 12.5 03/27/2011   HCT 37.5 03/27/2011   MCV 92.1 03/27/2011   PLT 203 03/27/2011      Chemistry      Component Value Date/Time   NA 142 02/28/2011 1030   NA 146* 09/06/2008 0942   K 3.7 02/28/2011 1030   K 3.8 09/06/2008 0942   CL 105 02/28/2011 1030   CL 108 09/06/2008 0942   CO2 26 02/28/2011 1030   CO2 28 09/06/2008 0942   BUN 12 02/28/2011 1030   BUN 15 09/06/2008 0942  CREATININE 0.92 02/28/2011 1030   CREATININE 0.9 09/06/2008 0942      Component Value Date/Time   CALCIUM 9.8 02/28/2011 1030   CALCIUM 9.5 09/06/2008 0942   ALKPHOS 68 02/28/2011 1030   ALKPHOS 77 06/03/2006 1041   AST 31 02/28/2011 1030   AST 69* 06/03/2006 1041   ALT 24 02/28/2011 1030   BILITOT 0.4 02/28/2011 1030   BILITOT 0.60 06/03/2006 1041       RADIOGRAPHIC STUDIES:  No results found.  ASSESSMENT: 78 year old female with  #1 anemia of chronic disease and renal insufficiency.  #2 patient with history of CA of tongue without evidence of recurrent disease   PLAN:    #1 patient will be continued to be followed for her anemia. She will receive Aranesp injections every 2-3 months time.  #2 she'll return in 6- 12 months in followup   All questions were answered. The patient knows to call the clinic with any problems, questions or concerns. We can certainly see the patient much sooner if necessary.  I spent 20 minutes counseling the patient face to face. The total time spent in the appointment was 30 minutes.    Marcy Panning, MD Medical/Oncology [facility] [phone] (beeper) [phone] (Office)  04/03/2011, 6:09 PM

## 2011-04-03 NOTE — Patient Instructions (Signed)
Your labs look good, we will see you back in 1 year

## 2011-06-24 ENCOUNTER — Other Ambulatory Visit: Payer: Self-pay | Admitting: Family Medicine

## 2011-06-24 ENCOUNTER — Ambulatory Visit
Admission: RE | Admit: 2011-06-24 | Discharge: 2011-06-24 | Disposition: A | Discharge status: Home / Self Care | Payer: PRIVATE HEALTH INSURANCE | Source: Ambulatory Visit | Attending: Family Medicine | Admitting: Family Medicine

## 2011-06-24 DIAGNOSIS — IMO0002 Reserved for concepts with insufficient information to code with codable children: Secondary | ICD-10-CM

## 2011-06-24 DIAGNOSIS — M24849 Other specific joint derangements of unspecified hand, not elsewhere classified: Secondary | ICD-10-CM

## 2011-07-17 ENCOUNTER — Emergency Department (HOSPITAL_COMMUNITY): Payer: PRIVATE HEALTH INSURANCE

## 2011-07-17 ENCOUNTER — Encounter (HOSPITAL_COMMUNITY): Payer: Self-pay | Admitting: *Deleted

## 2011-07-17 ENCOUNTER — Inpatient Hospital Stay (HOSPITAL_COMMUNITY)
Admission: EM | Admit: 2011-07-17 | Discharge: 2011-07-22 | DRG: 481 | Disposition: A | Discharge status: Home / Self Care | Payer: PRIVATE HEALTH INSURANCE | Attending: Internal Medicine | Admitting: Internal Medicine

## 2011-07-17 DIAGNOSIS — R7881 Bacteremia: Secondary | ICD-10-CM | POA: Diagnosis present

## 2011-07-17 DIAGNOSIS — N390 Urinary tract infection, site not specified: Secondary | ICD-10-CM | POA: Diagnosis present

## 2011-07-17 DIAGNOSIS — Z833 Family history of diabetes mellitus: Secondary | ICD-10-CM

## 2011-07-17 DIAGNOSIS — E119 Type 2 diabetes mellitus without complications: Secondary | ICD-10-CM | POA: Diagnosis present

## 2011-07-17 DIAGNOSIS — Y998 Other external cause status: Secondary | ICD-10-CM

## 2011-07-17 DIAGNOSIS — Z87891 Personal history of nicotine dependence: Secondary | ICD-10-CM

## 2011-07-17 DIAGNOSIS — D500 Iron deficiency anemia secondary to blood loss (chronic): Secondary | ICD-10-CM | POA: Diagnosis present

## 2011-07-17 DIAGNOSIS — S72109A Unspecified trochanteric fracture of unspecified femur, initial encounter for closed fracture: Principal | ICD-10-CM | POA: Diagnosis present

## 2011-07-17 DIAGNOSIS — D62 Acute posthemorrhagic anemia: Secondary | ICD-10-CM | POA: Diagnosis not present

## 2011-07-17 DIAGNOSIS — W010XXA Fall on same level from slipping, tripping and stumbling without subsequent striking against object, initial encounter: Secondary | ICD-10-CM | POA: Diagnosis present

## 2011-07-17 DIAGNOSIS — I10 Essential (primary) hypertension: Secondary | ICD-10-CM | POA: Diagnosis present

## 2011-07-17 DIAGNOSIS — Z85810 Personal history of malignant neoplasm of tongue: Secondary | ICD-10-CM

## 2011-07-17 DIAGNOSIS — Z23 Encounter for immunization: Secondary | ICD-10-CM

## 2011-07-17 DIAGNOSIS — S72009A Fracture of unspecified part of neck of unspecified femur, initial encounter for closed fracture: Secondary | ICD-10-CM

## 2011-07-17 DIAGNOSIS — E785 Hyperlipidemia, unspecified: Secondary | ICD-10-CM | POA: Diagnosis present

## 2011-07-17 DIAGNOSIS — S72002A Fracture of unspecified part of neck of left femur, initial encounter for closed fracture: Secondary | ICD-10-CM

## 2011-07-17 HISTORY — DX: Dyspnea, unspecified: R06.00

## 2011-07-17 HISTORY — DX: Gastro-esophageal reflux disease without esophagitis: K21.9

## 2011-07-17 HISTORY — DX: Other forms of dyspnea: R06.09

## 2011-07-17 HISTORY — DX: Fracture of unspecified part of neck of unspecified femur, initial encounter for closed fracture: S72.009A

## 2011-07-17 HISTORY — DX: Presence of artificial eye: Z97.0

## 2011-07-17 HISTORY — DX: Pure hypercholesterolemia, unspecified: E78.00

## 2011-07-17 HISTORY — DX: Major depressive disorder, single episode, unspecified: F32.9

## 2011-07-17 HISTORY — DX: Type 2 diabetes mellitus without complications: E11.9

## 2011-07-17 HISTORY — DX: Depression, unspecified: F32.A

## 2011-07-17 HISTORY — DX: Blindness, one eye, unspecified eye: H54.40

## 2011-07-17 HISTORY — DX: Malignant neoplasm of tongue, unspecified: C02.9

## 2011-07-17 HISTORY — DX: Essential (primary) hypertension: I10

## 2011-07-17 HISTORY — DX: Angina pectoris, unspecified: I20.9

## 2011-07-17 LAB — CBC WITH DIFFERENTIAL/PLATELET
Basophils Absolute: 0 10*3/uL (ref 0.0–0.1)
Eosinophils Relative: 2 % (ref 0–5)
Lymphocytes Relative: 34 % (ref 12–46)
Lymphs Abs: 2.3 10*3/uL (ref 0.7–4.0)
MCV: 89.9 fL (ref 78.0–100.0)
Neutrophils Relative %: 58 % (ref 43–77)
Platelets: 193 10*3/uL (ref 150–400)
RBC: 3.97 MIL/uL (ref 3.87–5.11)
RDW: 14.3 % (ref 11.5–15.5)
WBC: 6.9 10*3/uL (ref 4.0–10.5)

## 2011-07-17 LAB — BASIC METABOLIC PANEL
CO2: 22 mEq/L (ref 19–32)
Calcium: 9.2 mg/dL (ref 8.4–10.5)
GFR calc non Af Amer: 52 mL/min — ABNORMAL LOW (ref >90)
Glucose, Bld: 107 mg/dL — ABNORMAL HIGH (ref 70–99)
Potassium: 3.7 mEq/L (ref 3.5–5.1)
Sodium: 136 mEq/L (ref 135–145)

## 2011-07-17 MED ORDER — HYDROMORPHONE HCL PF 1 MG/ML IJ SOLN
1.0000 mg | Freq: Once | INTRAMUSCULAR | Status: AC
  Administered 2011-07-17: 1 mg via INTRAVENOUS
  Filled 2011-07-17: qty 1

## 2011-07-17 MED ORDER — ONDANSETRON HCL 4 MG/2ML IJ SOLN
4.0000 mg | Freq: Once | INTRAMUSCULAR | Status: AC
  Administered 2011-07-17: 4 mg via INTRAVENOUS
  Filled 2011-07-17: qty 2

## 2011-07-17 MED ORDER — ONDANSETRON HCL 4 MG/2ML IJ SOLN: 4.0000 mg | Freq: Once | INTRAMUSCULAR | Status: DC

## 2011-07-17 NOTE — ED Provider Notes (Cosign Needed)
History     CSN: [id number]  Arrival date & time 07/17/11  2012   First MD Initiated Contact with Patient 07/17/11 2018      Chief Complaint  Patient presents with  . Fall    (Consider location/radiation/quality/duration/timing/severity/associated sxs/prior treatment) Patient is a 78 y.o. female presenting with fall. The history is provided by the patient (the pt slipped outside and hurt her left hip). A language interpreter was used.  Fall The accident occurred less than 1 hour ago. The fall occurred while walking. She fell from a height of 1 to 2 ft. She landed on grass. Point of impact: left hip. The pain is present in the left hip. The pain is at a severity of 8/10. The pain is moderate. She was not ambulatory at the scene. There was entrapment after the fall. There was no drug use involved in the accident. Associated symptoms include visual change. Pertinent negatives include no abdominal pain, no hematuria and no headaches. The symptoms are aggravated by activity.    Past Medical History  Diagnosis Date  . Anemia 04/03/2011  . Tongue cancer   . Hypertension   . Diabetes mellitus     History reviewed. No pertinent past surgical history.  History reviewed. No pertinent family history.  History  Substance Use Topics  . Smoking status: Former Research scientist (life sciences)  . Smokeless tobacco: Not on file  . Alcohol Use: Yes     occasional    OB History    Grav Para Term Preterm Abortions TAB SAB Ect Mult Living                  Review of Systems  Constitutional: Negative for fatigue.  HENT: Negative for congestion, sinus pressure and ear discharge.   Eyes: Negative for discharge.  Respiratory: Negative for cough.   Cardiovascular: Negative for chest pain.  Gastrointestinal: Negative for abdominal pain and diarrhea.  Genitourinary: Negative for frequency and hematuria.  Musculoskeletal: Negative for back pain.       Hip pain and deformity  Skin: Negative for rash.  Neurological:  Negative for seizures and headaches.  Hematological: Negative.   Psychiatric/Behavioral: Negative for hallucinations.    Allergies  Review of patient's allergies indicates no known allergies.  Home Medications   Current Outpatient Rx  Name Route Sig Dispense Refill  . ASPIRIN 81 MG PO TABS Oral Take 81 mg by mouth daily.    Marland Kitchen CARVEDILOL 3.125 MG PO TABS Oral Take 3.125 mg by mouth 2 (two) times daily with a meal.    . FELODIPINE ER 2.5 MG PO TB24 Oral Take 2.5 mg by mouth 2 (two) times daily.    . FUROSEMIDE 20 MG PO TABS Oral Take 20 mg by mouth 2 (two) times daily.    Marland Kitchen LISINOPRIL 20 MG PO TABS Oral Take 20 mg by mouth daily.    Marland Kitchen ONE-DAILY MULTI VITAMINS PO TABS Oral Take 1 tablet by mouth daily.    Marland Kitchen OMEPRAZOLE 20 MG PO CPDR Oral Take 20 mg by mouth 2 (two) times daily.     Marland Kitchen SIMVASTATIN 40 MG PO TABS Oral Take 40 mg by mouth every evening.      BP 152/77  Pulse 89  Temp 99.1 F (37.3 C) (Oral)  Resp 11  SpO2 90%  Physical Exam  Constitutional: She is oriented to person, place, and time. She appears well-developed.  HENT:  Head: Normocephalic and atraumatic.  Eyes: Conjunctivae and EOM are normal. No scleral icterus.  Neck:  Neck supple. No thyromegaly present.  Cardiovascular: Normal rate and regular rhythm.  Exam reveals no gallop and no friction rub.   No murmur heard. Pulmonary/Chest: No stridor. She has no wheezes. She has no rales. She exhibits no tenderness.  Abdominal: She exhibits no distension. There is no tenderness. There is no rebound.  Musculoskeletal: She exhibits no edema.       Tender left hip.  Neuro vasc normal in left leg  Lymphadenopathy:    She has no cervical adenopathy.  Neurological: She is oriented to person, place, and time. Coordination normal.  Skin: No rash noted. No erythema.  Psychiatric: She has a normal mood and affect. Her behavior is normal.    ED Course  Procedures (including critical care time)  Labs Reviewed  CBC WITH  DIFFERENTIAL - Abnormal; Notable for the following:    HCT 35.7 (*)     All other components within normal limits  BASIC METABOLIC PANEL - Abnormal; Notable for the following:    Glucose, Bld 107 (*)     GFR calc non Af Amer 52 (*)     GFR calc Af Amer 60 (*)     All other components within normal limits   Dg Chest 1 View  07/17/2011  *RADIOLOGY REPORT*  Clinical Data: Fall  CHEST - 1 VIEW  Comparison: 02/28/2011  Findings: Mildly prominent cardiomediastinal contours are without interval change allowing for differences in technique/respiratory effort.  Mild right hemidiaphragm elevation.  Lungs are clear.  No pneumothorax.  No acute osseous finding.  IMPRESSION: Prominent cardiomediastinal contours are similar to prior.  No focal consolidation.  Original Report Authenticated By: Suanne Marker, M.D.   Dg Hip Complete Left  07/17/2011  *RADIOLOGY REPORT*  Clinical Data: Left hip deformity status post fall.  LEFT HIP - COMPLETE 2+ VIEW  Comparison: None.  Findings: There is a comminuted femur fracture including a displaced fracture of the left femoral neck.  The femoral shaft is displaced proximally. Transversely oriented fracture line extends through the greater trochanter.  The femoral head remains seated within the acetabulum however is forced into an abducted position. Diffuse osteopenia.  No additional fracture identified.  IMPRESSION: Displaced left femoral neck fracture.  Transverse fracture through the left greater trochanter.  Original Report Authenticated By: Suanne Marker, M.D.     1. Hip fracture       MDM          Maudry Diego, MD 07/17/11 [id number]  Maudry Diego, MD 07/24/11 [place], MD 07/31/11 [phone]

## 2011-07-17 NOTE — H&P (Signed)
[name] is an 78 y.o. female.   PCP - Donivan Scull Blount. Chief Complaint: Fall with left hip pain. HPI: 78 year-old female with history of hypertension and diabetes mellitus type 2 and previous history of tongue cancer has had radiation and chemotherapy was brought to the ER after patient had a fall at the backyard of her house. Patient states she was walking to his neighbors house when she fell after slipping. She denies having hit her head or losing consciousness. She started having pain in the left hip and was brought to the ER and x-rays reveal fracture of the left hip. She is being admitted for further management. Patient denies any chest pain shortness of breath abdominal pain nausea vomiting or diarrhea. She does admit to having drank alcohol yesterday being a holiday. Patient states that she only drinks alcohol on holidays.  Past Medical History  Diagnosis Date  . Anemia 04/03/2011  . Tongue cancer   . Hypertension   . Diabetes mellitus     Past Surgical History  Procedure Date  . Tubal ligation     Family History  Problem Relation Age of Onset  . Diabetes type II Mother   . Diabetes type II Father   . Diabetes type II Sister    Social History:  reports that she has quit smoking. She does not have any smokeless tobacco history on file. She reports that she drinks alcohol. She reports that she does not use illicit drugs.  Allergies: No Known Allergies   (Not in a hospital admission)  Results for orders placed during the hospital encounter of 07/17/11 (from the past 48 hour(s))  CBC WITH DIFFERENTIAL     Status: Abnormal   Collection Time   07/17/11  8:37 PM      Component Value Range Comment   WBC 6.9  4.0 - 10.5 K/uL    RBC 3.97  3.87 - 5.11 MIL/uL    Hemoglobin 12.2  12.0 - 15.0 g/dL    HCT 35.7 (*) 36.0 - 46.0 %    MCV 89.9  78.0 - 100.0 fL    MCH 30.7  26.0 - 34.0 pg    MCHC 34.2  30.0 - 36.0 g/dL    RDW 14.3  11.5 - 15.5 %    Platelets 193  150 - 400 K/uL      Neutrophils Relative 58  43 - 77 %    Neutro Abs 4.0  1.7 - 7.7 K/uL    Lymphocytes Relative 34  12 - 46 %    Lymphs Abs 2.3  0.7 - 4.0 K/uL    Monocytes Relative 7  3 - 12 %    Monocytes Absolute 0.5  0.1 - 1.0 K/uL    Eosinophils Relative 2  0 - 5 %    Eosinophils Absolute 0.1  0.0 - 0.7 K/uL    Basophils Relative 0  0 - 1 %    Basophils Absolute 0.0  0.0 - 0.1 K/uL   BASIC METABOLIC PANEL     Status: Abnormal   Collection Time   07/17/11  8:37 PM      Component Value Range Comment   Sodium 136  135 - 145 mEq/L    Potassium 3.7  3.5 - 5.1 mEq/L    Chloride 98  96 - 112 mEq/L    CO2 22  19 - 32 mEq/L    Glucose, Bld 107 (*) 70 - 99 mg/dL    BUN 14  6 - 23  mg/dL    Creatinine, Ser 1.02  0.50 - 1.10 mg/dL    Calcium 9.2  8.4 - 10.5 mg/dL    GFR calc non Af Amer 52 (*) >90 mL/min    GFR calc Af Amer 60 (*) >90 mL/min    Dg Chest 1 View  07/17/2011  *RADIOLOGY REPORT*  Clinical Data: Fall  CHEST - 1 VIEW  Comparison: 02/28/2011  Findings: Mildly prominent cardiomediastinal contours are without interval change allowing for differences in technique/respiratory effort.  Mild right hemidiaphragm elevation.  Lungs are clear.  No pneumothorax.  No acute osseous finding.  IMPRESSION: Prominent cardiomediastinal contours are similar to prior.  No focal consolidation.  Original Report Authenticated By: Suanne Marker, M.D.   Dg Hip Complete Left  07/17/2011  *RADIOLOGY REPORT*  Clinical Data: Left hip deformity status post fall.  LEFT HIP - COMPLETE 2+ VIEW  Comparison: None.  Findings: There is a comminuted femur fracture including a displaced fracture of the left femoral neck.  The femoral shaft is displaced proximally. Transversely oriented fracture line extends through the greater trochanter.  The femoral head remains seated within the acetabulum however is forced into an abducted position. Diffuse osteopenia.  No additional fracture identified.  IMPRESSION: Displaced left femoral neck  fracture.  Transverse fracture through the left greater trochanter.  Original Report Authenticated By: Suanne Marker, M.D.    Review of Systems  Constitutional: Negative.   HENT: Negative.   Eyes: Negative.   Respiratory: Negative.   Cardiovascular: Negative.   Gastrointestinal: Negative.   Genitourinary: Negative.   Musculoskeletal: Positive for joint pain (Left hip pain.) and falls.  Skin: Negative.   Neurological: Negative.   Psychiatric/Behavioral: Negative.     Blood pressure 152/77, pulse 89, temperature 99.1 F (37.3 C), temperature source Oral, resp. rate 11, SpO2 90.00%. Physical Exam  Constitutional: She is oriented to person, place, and time. She appears well-developed and well-nourished.  HENT:  Head: Normocephalic and atraumatic.  Right Ear: External ear normal.  Left Ear: External ear normal.  Nose: Nose normal.  Mouth/Throat: Oropharynx is clear and moist. No oropharyngeal exudate.  Eyes: Left eye exhibits no discharge.       Artificial right eye.  Neck: Normal range of motion. Neck supple.  Cardiovascular: Normal rate and regular rhythm.   Respiratory: Effort normal and breath sounds normal. No respiratory distress. She has no wheezes. She has no rales.  GI: Soft. Bowel sounds are normal. She exhibits no distension. There is no tenderness. There is no rebound.  Musculoskeletal:       Externally rotated left hip.  Neurological: She is alert and oriented to person, place, and time.       Moves all extremities.  Skin: Skin is warm and dry.  Psychiatric: Her behavior is normal.     Assessment/Plan #1. Left-sided hip fracture - at this time patient denies any chest pain or shortness of breath and had a mechanical fall causing the hip fracture. Patient is usually ambulatory and independent for her ADLs. From medical standpoint patient will benefit from surgery if orthopedic surgeon is planning surgery for her left hip. Dr. Tamera Punt from orthopedic is to see  the patient in consult. We will keep patient n.p.o. in anticipation of possible surgery. #2. Diabetes mellitus type 2 - patient's antidiabetic medications are not listed in the medication reconciliation list. I have placed patient on sliding-scale coverage and at this time we'll keep check CBG every 4 hours until patient starts eating. #3. Hypertension -  continue present medications except for Lasix. I have also place patient on when necessary IV hydralazine for systolic blood pressure more 160. #4. History of hyperlipidemia. #5. History of tongue cancer being treated with chemotherapy and radiation. #6. History of tobacco abuse quit many years ago.  CODE STATUS - full code.  Rise Patience 07/17/2011, 11:33 PM

## 2011-07-17 NOTE — ED Notes (Signed)
Pt reports she was walking through the yard when she slipped in mud, heard a popping sound and is now experiencing left leg pain - pt w/ left leg shortening and external rotation.

## 2011-07-18 ENCOUNTER — Encounter (HOSPITAL_COMMUNITY): Payer: Self-pay | Admitting: Anesthesiology

## 2011-07-18 ENCOUNTER — Inpatient Hospital Stay (HOSPITAL_COMMUNITY): Payer: PRIVATE HEALTH INSURANCE

## 2011-07-18 ENCOUNTER — Inpatient Hospital Stay (HOSPITAL_COMMUNITY): Payer: PRIVATE HEALTH INSURANCE | Admitting: Anesthesiology

## 2011-07-18 ENCOUNTER — Encounter (HOSPITAL_COMMUNITY)
Admission: EM | Disposition: A | Discharge status: Home / Self Care | Payer: Self-pay | Source: Home / Self Care | Attending: Internal Medicine

## 2011-07-18 ENCOUNTER — Encounter (HOSPITAL_COMMUNITY): Payer: Self-pay | Admitting: General Practice

## 2011-07-18 HISTORY — PX: HIP ARTHROPLASTY: SHX981

## 2011-07-18 LAB — CBC
HCT: 31.7 % — ABNORMAL LOW (ref 36.0–46.0)
MCH: 30.9 pg (ref 26.0–34.0)
MCHC: 34.7 g/dL (ref 30.0–36.0)
MCV: 89 fL (ref 78.0–100.0)
Platelets: 202 10*3/uL (ref 150–400)
RDW: 14.1 % (ref 11.5–15.5)

## 2011-07-18 LAB — COMPREHENSIVE METABOLIC PANEL
ALT: 18 U/L (ref 0–35)
AST: 32 U/L (ref 0–37)
Albumin: 3.6 g/dL (ref 3.5–5.2)
Alkaline Phosphatase: 72 U/L (ref 39–117)
CO2: 22 mEq/L (ref 19–32)
Calcium: 9.1 mg/dL (ref 8.4–10.5)
Creatinine, Ser: 0.8 mg/dL (ref 0.50–1.10)
Glucose, Bld: 137 mg/dL — ABNORMAL HIGH (ref 70–99)
Total Bilirubin: 0.4 mg/dL (ref 0.3–1.2)

## 2011-07-18 LAB — GLUCOSE, CAPILLARY
Glucose-Capillary: 132 mg/dL — ABNORMAL HIGH (ref 70–99)
Glucose-Capillary: 118 mg/dL — ABNORMAL HIGH (ref 70–99)

## 2011-07-18 LAB — ABO/RH: ABO/RH(D): B POS

## 2011-07-18 SURGERY — HEMIARTHROPLASTY, HIP, DIRECT ANTERIOR APPROACH, FOR FRACTURE
Anesthesia: General | Site: Hip | Laterality: Left | Wound class: Clean

## 2011-07-18 MED ORDER — ONDANSETRON HCL 4 MG PO TABS: 4.0000 mg | ORAL_TABLET | Freq: Four times a day (QID) | ORAL | Status: DC | PRN

## 2011-07-18 MED ORDER — LACTATED RINGERS IV SOLN
INTRAVENOUS | Status: DC | PRN
  Administered 2011-07-18 (×3): via INTRAVENOUS

## 2011-07-18 MED ORDER — HYDROMORPHONE HCL PF 1 MG/ML IJ SOLN: 0.2500 mg | INTRAMUSCULAR | Status: DC | PRN

## 2011-07-18 MED ORDER — PHENOL 1.4 % MT LIQD: 1.0000 | OROMUCOSAL | Status: DC | PRN

## 2011-07-18 MED ORDER — FENTANYL CITRATE 0.05 MG/ML IJ SOLN
INTRAMUSCULAR | Status: DC | PRN
  Administered 2011-07-18: 50 ug via INTRAVENOUS
  Administered 2011-07-18: 25 ug via INTRAVENOUS
  Administered 2011-07-18: 75 ug via INTRAVENOUS
  Administered 2011-07-18: 50 ug via INTRAVENOUS
  Administered 2011-07-18: 25 ug via INTRAVENOUS

## 2011-07-18 MED ORDER — SIMVASTATIN 40 MG PO TABS
40.0000 mg | ORAL_TABLET | Freq: Every evening | ORAL | Status: DC
  Administered 2011-07-19 – 2011-07-21 (×3): 40 mg via ORAL
  Filled 2011-07-18 (×5): qty 1

## 2011-07-18 MED ORDER — SODIUM CHLORIDE 0.9 % IV SOLN: INTRAVENOUS | Status: AC

## 2011-07-18 MED ORDER — ONDANSETRON HCL 4 MG/2ML IJ SOLN: 4.0000 mg | Freq: Once | INTRAMUSCULAR | Status: DC | PRN

## 2011-07-18 MED ORDER — PHENYLEPHRINE HCL 10 MG/ML IJ SOLN
INTRAMUSCULAR | Status: DC | PRN
  Administered 2011-07-18: 80 ug via INTRAVENOUS
  Administered 2011-07-18: 40 ug via INTRAVENOUS

## 2011-07-18 MED ORDER — FERROUS SULFATE 325 (65 FE) MG PO TABS
325.0000 mg | ORAL_TABLET | Freq: Three times a day (TID) | ORAL | Status: DC
  Administered 2011-07-19 – 2011-07-22 (×11): 325 mg via ORAL
  Filled 2011-07-18 (×13): qty 1

## 2011-07-18 MED ORDER — SODIUM CHLORIDE 0.9 % IR SOLN
Status: DC | PRN
  Administered 2011-07-18: 3000 mL
  Administered 2011-07-18: 1000 mL

## 2011-07-18 MED ORDER — ONDANSETRON HCL 4 MG/2ML IJ SOLN
INTRAMUSCULAR | Status: DC | PRN
  Administered 2011-07-18: 4 mg via INTRAVENOUS

## 2011-07-18 MED ORDER — CEFAZOLIN SODIUM-DEXTROSE 2-3 GM-% IV SOLR
2.0000 g | Freq: Four times a day (QID) | INTRAVENOUS | Status: AC
  Administered 2011-07-19 (×2): 2 g via INTRAVENOUS
  Filled 2011-07-18 (×2): qty 50

## 2011-07-18 MED ORDER — HYDRALAZINE HCL 20 MG/ML IJ SOLN
10.0000 mg | INTRAMUSCULAR | Status: DC | PRN
  Filled 2011-07-18: qty 0.5

## 2011-07-18 MED ORDER — FELODIPINE ER 2.5 MG PO TB24
2.5000 mg | ORAL_TABLET | Freq: Two times a day (BID) | ORAL | Status: DC
  Administered 2011-07-18 – 2011-07-22 (×8): 2.5 mg via ORAL
  Filled 2011-07-18 (×10): qty 1

## 2011-07-18 MED ORDER — CARVEDILOL 3.125 MG PO TABS
3.1250 mg | ORAL_TABLET | Freq: Two times a day (BID) | ORAL | Status: DC
  Administered 2011-07-18 – 2011-07-22 (×8): 3.125 mg via ORAL
  Filled 2011-07-18 (×12): qty 1

## 2011-07-18 MED ORDER — HYDROMORPHONE HCL PF 1 MG/ML IJ SOLN
0.5000 mg | INTRAMUSCULAR | Status: DC | PRN
  Administered 2011-07-18 – 2011-07-19 (×5): 0.5 mg via INTRAVENOUS
  Filled 2011-07-18 (×3): qty 1

## 2011-07-18 MED ORDER — PNEUMOCOCCAL VAC POLYVALENT 25 MCG/0.5ML IJ INJ
0.5000 mL | INJECTION | INTRAMUSCULAR | Status: AC
  Administered 2011-07-19: 0.5 mL via INTRAMUSCULAR
  Filled 2011-07-18: qty 0.5

## 2011-07-18 MED ORDER — EPHEDRINE SULFATE 50 MG/ML IJ SOLN
INTRAMUSCULAR | Status: DC | PRN
  Administered 2011-07-18: 10 mg via INTRAVENOUS
  Administered 2011-07-18 (×5): 5 mg via INTRAVENOUS

## 2011-07-18 MED ORDER — ONDANSETRON HCL 4 MG/2ML IJ SOLN: 4.0000 mg | Freq: Four times a day (QID) | INTRAMUSCULAR | Status: DC | PRN

## 2011-07-18 MED ORDER — NEOSTIGMINE METHYLSULFATE 1 MG/ML IJ SOLN
INTRAMUSCULAR | Status: DC | PRN
  Administered 2011-07-18: 3 mg via INTRAVENOUS

## 2011-07-18 MED ORDER — DEXTROSE 5 % IV SOLN
2.0000 g | INTRAVENOUS | Status: DC | PRN
  Administered 2011-07-18: 2 g via INTRAVENOUS

## 2011-07-18 MED ORDER — ONDANSETRON HCL 4 MG/2ML IJ SOLN
4.0000 mg | Freq: Four times a day (QID) | INTRAMUSCULAR | Status: DC | PRN
  Administered 2011-07-19: 4 mg via INTRAVENOUS
  Filled 2011-07-18: qty 2

## 2011-07-18 MED ORDER — MENTHOL 3 MG MT LOZG: 1.0000 | LOZENGE | OROMUCOSAL | Status: DC | PRN

## 2011-07-18 MED ORDER — ACETAMINOPHEN 10 MG/ML IV SOLN
1000.0000 mg | Freq: Once | INTRAVENOUS | Status: AC | PRN
  Filled 2011-07-18: qty 100

## 2011-07-18 MED ORDER — PANTOPRAZOLE SODIUM 40 MG PO TBEC
40.0000 mg | DELAYED_RELEASE_TABLET | Freq: Every day | ORAL | Status: DC
  Administered 2011-07-18 – 2011-07-22 (×5): 40 mg via ORAL
  Filled 2011-07-18 (×6): qty 1

## 2011-07-18 MED ORDER — HYDROCODONE-ACETAMINOPHEN 5-325 MG PO TABS
1.0000 | ORAL_TABLET | Freq: Four times a day (QID) | ORAL | Status: DC | PRN
  Administered 2011-07-19 (×3): 2 via ORAL
  Administered 2011-07-20 (×2): 1 via ORAL
  Administered 2011-07-21: 2 via ORAL
  Administered 2011-07-21: 1 via ORAL
  Administered 2011-07-22: 2 via ORAL
  Filled 2011-07-18: qty 2
  Filled 2011-07-18: qty 1
  Filled 2011-07-18 (×4): qty 2
  Filled 2011-07-18 (×2): qty 1

## 2011-07-18 MED ORDER — PROPOFOL 10 MG/ML IV EMUL
INTRAVENOUS | Status: DC | PRN
  Administered 2011-07-18: 90 mg via INTRAVENOUS

## 2011-07-18 MED ORDER — METOCLOPRAMIDE HCL 5 MG/ML IJ SOLN
5.0000 mg | Freq: Three times a day (TID) | INTRAMUSCULAR | Status: DC | PRN
Start: 2011-07-18 — End: 2011-07-22

## 2011-07-18 MED ORDER — CEFAZOLIN SODIUM-DEXTROSE 2-3 GM-% IV SOLR
2.0000 g | INTRAVENOUS | Status: DC
  Filled 2011-07-18: qty 50

## 2011-07-18 MED ORDER — ROCURONIUM BROMIDE 100 MG/10ML IV SOLN
INTRAVENOUS | Status: DC | PRN
  Administered 2011-07-18: 30 mg via INTRAVENOUS

## 2011-07-18 MED ORDER — SODIUM CHLORIDE 0.9 % IV SOLN: INTRAVENOUS | Status: DC

## 2011-07-18 MED ORDER — MORPHINE SULFATE 2 MG/ML IJ SOLN: 0.5000 mg | INTRAMUSCULAR | Status: DC | PRN

## 2011-07-18 MED ORDER — ONDANSETRON HCL 4 MG/2ML IJ SOLN: 4.0000 mg | Freq: Three times a day (TID) | INTRAMUSCULAR | Status: DC | PRN

## 2011-07-18 MED ORDER — INSULIN ASPART 100 UNIT/ML ~~LOC~~ SOLN
0.0000 [IU] | Freq: Three times a day (TID) | SUBCUTANEOUS | Status: DC
  Administered 2011-07-18 – 2011-07-20 (×4): 1 [IU] via SUBCUTANEOUS

## 2011-07-18 MED ORDER — ACETAMINOPHEN 325 MG PO TABS: 650.0000 mg | ORAL_TABLET | Freq: Four times a day (QID) | ORAL | Status: DC | PRN

## 2011-07-18 MED ORDER — HYDROMORPHONE HCL PF 1 MG/ML IJ SOLN: 1.0000 mg | INTRAMUSCULAR | Status: DC | PRN

## 2011-07-18 MED ORDER — LACTATED RINGERS IV SOLN
INTRAVENOUS | Status: DC
  Administered 2011-07-18: 17:00:00 via INTRAVENOUS

## 2011-07-18 MED ORDER — CHLORHEXIDINE GLUCONATE 4 % EX LIQD
60.0000 mL | Freq: Once | CUTANEOUS | Status: DC
  Filled 2011-07-18: qty 60

## 2011-07-18 MED ORDER — LISINOPRIL 20 MG PO TABS
20.0000 mg | ORAL_TABLET | Freq: Every day | ORAL | Status: DC
  Administered 2011-07-18 – 2011-07-22 (×5): 20 mg via ORAL
  Filled 2011-07-18 (×5): qty 1

## 2011-07-18 MED ORDER — LIDOCAINE HCL (CARDIAC) 20 MG/ML IV SOLN
INTRAVENOUS | Status: DC | PRN
  Administered 2011-07-18: 40 mg via INTRAVENOUS

## 2011-07-18 MED ORDER — ACETAMINOPHEN 650 MG RE SUPP: 650.0000 mg | Freq: Four times a day (QID) | RECTAL | Status: DC | PRN

## 2011-07-18 MED ORDER — GLYCOPYRROLATE 0.2 MG/ML IJ SOLN
INTRAMUSCULAR | Status: DC | PRN
  Administered 2011-07-18: .4 mg via INTRAVENOUS

## 2011-07-18 MED ORDER — METOCLOPRAMIDE HCL 10 MG PO TABS
5.0000 mg | ORAL_TABLET | Freq: Three times a day (TID) | ORAL | Status: DC | PRN
Start: 2011-07-18 — End: 2011-07-22

## 2011-07-18 MED ORDER — ENOXAPARIN SODIUM 30 MG/0.3ML ~~LOC~~ SOLN
30.0000 mg | Freq: Two times a day (BID) | SUBCUTANEOUS | Status: DC
  Administered 2011-07-19 – 2011-07-22 (×7): 30 mg via SUBCUTANEOUS
  Filled 2011-07-18 (×10): qty 0.3

## 2011-07-18 SURGICAL SUPPLY — 69 items
BLADE SAW SAG 73X25 THK (BLADE) ×1
BLADE SAW SGTL 73X25 THK (BLADE) ×1 IMPLANT
BLADE SURG ROTATE 9660 (MISCELLANEOUS) IMPLANT
BRUSH FEMORAL CANAL (MISCELLANEOUS) ×2 IMPLANT
CEMENT BONE DEPUY (Cement) ×2 IMPLANT
CEMENT RESTRICTOR DEPUY SZ 3 (Cement) ×1 IMPLANT
CHLORAPREP W/TINT 26ML (MISCELLANEOUS) ×2 IMPLANT
CLOTH BEACON ORANGE TIMEOUT ST (SAFETY) ×2 IMPLANT
COVER PROBE W GEL 5X96 (DRAPES) ×1 IMPLANT
COVER SURGICAL LIGHT HANDLE (MISCELLANEOUS) ×2 IMPLANT
DRAPE INCISE IOBAN 66X45 STRL (DRAPES) ×1 IMPLANT
DRAPE ORTHO SPLIT 77X108 STRL (DRAPES) ×4
DRAPE PROXIMA HALF (DRAPES) ×2 IMPLANT
DRAPE SURG ORHT 6 SPLT 77X108 (DRAPES) ×2 IMPLANT
DRAPE U-SHAPE 47X51 STRL (DRAPES) ×2 IMPLANT
DRILL BIT 7/64X5 (BIT) ×2 IMPLANT
DRSG ADAPTIC 3X8 NADH LF (GAUZE/BANDAGES/DRESSINGS) ×1 IMPLANT
DRSG MEPILEX BORDER 4X12 (GAUZE/BANDAGES/DRESSINGS) ×1 IMPLANT
DRSG PAD ABDOMINAL 8X10 ST (GAUZE/BANDAGES/DRESSINGS) ×2 IMPLANT
ELECT BLADE 6.5 EXT (BLADE) IMPLANT
ELECT REM PT RETURN 9FT ADLT (ELECTROSURGICAL) ×2
ELECTRODE REM PT RTRN 9FT ADLT (ELECTROSURGICAL) ×1 IMPLANT
EVACUATOR 1/8 PVC DRAIN (DRAIN) IMPLANT
Femoral Pressurizer ×1 IMPLANT
GLOVE BIO SURGEON STRL SZ7 (GLOVE) ×3 IMPLANT
GLOVE BIO SURGEON STRL SZ7.5 (GLOVE) ×3 IMPLANT
GLOVE BIOGEL PI IND STRL 6.5 (GLOVE) IMPLANT
GLOVE BIOGEL PI IND STRL 8 (GLOVE) ×1 IMPLANT
GLOVE BIOGEL PI INDICATOR 6.5 (GLOVE) ×2
GLOVE BIOGEL PI INDICATOR 8 (GLOVE) ×1
GLOVE SS BIOGEL STRL SZ 8 (GLOVE) ×1 IMPLANT
GLOVE SUPERSENSE BIOGEL SZ 8 (GLOVE) ×1
GOWN PREVENTION PLUS LG XLONG (DISPOSABLE) ×2 IMPLANT
GOWN STRL NON-REIN LRG LVL3 (GOWN DISPOSABLE) ×5 IMPLANT
HANDPIECE INTERPULSE COAX TIP (DISPOSABLE) ×2
HOOD PEEL AWAY FACE SHEILD DIS (HOOD) ×4 IMPLANT
IMMOBILIZER KNEE 20 (SOFTGOODS)
IMMOBILIZER KNEE 20 THIGH 36 (SOFTGOODS) IMPLANT
IMMOBILIZER KNEE 22 UNIV (SOFTGOODS) ×2 IMPLANT
IMMOBILIZER KNEE 24 THIGH 36 (MISCELLANEOUS) IMPLANT
IMMOBILIZER KNEE 24 UNIV (MISCELLANEOUS)
KIT BASIN OR (CUSTOM PROCEDURE TRAY) ×2 IMPLANT
KIT ROOM TURNOVER OR (KITS) ×2 IMPLANT
MANIFOLD NEPTUNE II (INSTRUMENTS) ×2 IMPLANT
NDL MAYO TROCAR (NEEDLE) ×1 IMPLANT
NEEDLE MAYO TROCAR (NEEDLE) ×2 IMPLANT
NS IRRIG 1000ML POUR BTL (IV SOLUTION) ×2 IMPLANT
PACK TOTAL JOINT (CUSTOM PROCEDURE TRAY) ×2 IMPLANT
PAD ARMBOARD 7.5X6 YLW CONV (MISCELLANEOUS) ×4 IMPLANT
PASSER SUT SWANSON 36MM LOOP (INSTRUMENTS) IMPLANT
PRESSURIZER FEMORAL UNIV (MISCELLANEOUS) ×1 IMPLANT
SET HNDPC FAN SPRY TIP SCT (DISPOSABLE) IMPLANT
SLEEVE CABLE 2MM VT (Orthopedic Implant) ×2 IMPLANT
SPONGE GAUZE 4X4 12PLY (GAUZE/BANDAGES/DRESSINGS) ×2 IMPLANT
STAPLER VISISTAT 35W (STAPLE) ×2 IMPLANT
SUCTION FRAZIER TIP 10 FR DISP (SUCTIONS) ×2 IMPLANT
SUT ETHIBOND NAB CT1 #1 30IN (SUTURE) ×8 IMPLANT
SUT FIBERWIRE #5 38 CONV NDL (SUTURE) ×6
SUT VIC AB 0 CT1 27 (SUTURE) ×2
SUT VIC AB 0 CT1 27XBRD ANBCTR (SUTURE) ×1 IMPLANT
SUT VIC AB 1 CTB1 27 (SUTURE) ×6 IMPLANT
SUT VIC AB 2-0 CT1 27 (SUTURE) ×2
SUT VIC AB 2-0 CT1 TAPERPNT 27 (SUTURE) ×1 IMPLANT
SUTURE FIBERWR #5 38 CONV NDL (SUTURE) IMPLANT
TOWEL OR 17X24 6PK STRL BLUE (TOWEL DISPOSABLE) ×2 IMPLANT
TOWEL OR 17X26 10 PK STRL BLUE (TOWEL DISPOSABLE) ×2 IMPLANT
TOWER CARTRIDGE SMART MIX (DISPOSABLE) ×2 IMPLANT
TRAY FOLEY CATH 14FR (SET/KITS/TRAYS/PACK) ×1 IMPLANT
WATER STERILE IRR 1000ML POUR (IV SOLUTION) ×5 IMPLANT

## 2011-07-18 NOTE — Transfer of Care (Signed)
Immediate Anesthesia Transfer of Care Note  Patient: [name]  Procedure(s) Performed: Procedure(s) (LRB): ARTHROPLASTY BIPOLAR HIP (Left)  Patient Location: PACU  Anesthesia Type: General  Level of Consciousness: awake, alert  and oriented  Airway & Oxygen Therapy: Patient connected to nasal cannula oxygen  Post-op Assessment: Report given to PACU RN, Post -op Vital signs reviewed and stable and Patient moving all extremities X 4  Post vital signs: Reviewed and stable  Complications: No apparent anesthesia complications

## 2011-07-18 NOTE — Anesthesia Procedure Notes (Signed)
Procedure Name: Intubation Date/Time: 07/18/2011 5:49 PM Performed by: Valetta Fuller Pre-anesthesia Checklist: Patient identified, Emergency Drugs available, Suction available, Patient being monitored and Timeout performed Patient Re-evaluated:Patient Re-evaluated prior to inductionOxygen Delivery Method: Circle system utilized Preoxygenation: Pre-oxygenation with 100% oxygen Intubation Type: IV induction Ventilation: Mask ventilation without difficulty and Oral airway inserted - appropriate to patient size Laryngoscope Size: Sabra Heck and 2 Grade View: Grade I Tube type: Oral Number of attempts: 2 Airway Equipment and Method: Stylet Placement Confirmation: ETT inserted through vocal cords under direct vision,  positive ETCO2,  CO2 detector and breath sounds checked- equal and bilateral Secured at: 21 cm Tube secured with: Tape Dental Injury: Teeth and Oropharynx as per pre-operative assessment  Comments: Easy mask vent with OPA, Grade 1 view with Miller 2, ETT placed under direct vision, Air exchange heard over epigastrum.  ETT out, Mask vent, Grade 1 view with Mac 3, ETT placed under direct vision.  EtCO2 Positive, BBS=.

## 2011-07-18 NOTE — Progress Notes (Signed)
Patient consented to surgery and blood products.  Patient vital signs are stable.  Blood pressure medications were given as schedule this AM.  CHG bath was completed at 1530.  Patient family notified about surgery time.  Consents and Ancef are on the front of the charge.  Pre-op to come get patient for surgery.  Isha Seefeld N

## 2011-07-18 NOTE — Anesthesia Preprocedure Evaluation (Addendum)
Anesthesia Evaluation  Patient identified by MRN, date of birth, ID band Patient awake    Reviewed: Allergy & Precautions, H&P , NPO status , Patient's Chart, lab work & pertinent test results  Airway Mallampati: II TM Distance: >3 FB Neck ROM: Full    Dental  (+) Edentulous Lower and Edentulous Upper Thrush noted on tongue. Pt states she is being treated for it by Dr. Gwenlyn Saran.:   Pulmonary  breath sounds clear to auscultation        Cardiovascular hypertension, Pt. on medications + angina Rhythm:Regular Rate:Normal  Pt states chest pain only when "upset", not during exertion.   Neuro/Psych Depression    GI/Hepatic GERD-  Medicated and Controlled,  Endo/Other  Diabetes mellitus-, Type 2, Oral Hypoglycemic Agents  Renal/GU      Musculoskeletal   Abdominal   Peds  Hematology   Anesthesia Other Findings   Reproductive/Obstetrics                          Anesthesia Physical Anesthesia Plan  ASA: III  Anesthesia Plan: General   Post-op Pain Management:    Induction: Intravenous  Airway Management Planned: Oral ETT  Additional Equipment:   Intra-op Plan:   Post-operative Plan: Extubation in OR  Informed Consent: I have reviewed the patients History and Physical, chart, labs and discussed the procedure including the risks, benefits and alternatives for the proposed anesthesia with the patient or authorized representative who has indicated his/her understanding and acceptance.     Plan Discussed with:   Anesthesia Plan Comments: (Fx. L. Hip Type 2 DM glucose 118 Htn H/O Ca of tongue S/O chemo and XRT 15 years ago  Placedo, MD)        Anesthesia Quick Evaluation

## 2011-07-18 NOTE — Consult Note (Signed)
Reason for Consult: Left hip fracture Referring Physician: Dr. Tally Joe Mccalman is an 78 y.o. female.  HPI: 78 year old female who's a patient of Dr. Lajean Silvius who fell last night walking to her neighbors house. She had immediate pain in her left hip and was unable to ambulate. She was seen in the emergency department where she was diagnosed with a left hip fracture. I was consult to for evaluation and management. Pain was severe on the left side, worse with any movement, better with rest. She did not have any preceding pain in his hip. 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  Past Medical History  Diagnosis Date  . Anemia 04/03/2011  . Hypertension   . High cholesterol   . Type II diabetes mellitus   . Anginal pain   . Exertional dyspnea   . GERD (gastroesophageal reflux disease)   . 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 Depression   . 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 Blind left eye 1999  . Hip fracture 07/17/11    fall from 1-2 feet; left    Past Surgical History  Procedure Date  . Tubal ligation 1970's  . Tongue biopsy 1998    "cancer"  . Intraocular prostheses insertion 1999    left    Family History  Problem Relation Age of Onset  . Diabetes type II Mother   . Diabetes type II Father   . Diabetes type II Sister     Social History:  reports that she quit smoking about 13 years ago. Her smoking use included Cigarettes. She has a 70.5 pack-year smoking history. She has never used smokeless tobacco. She reports that she drinks about 10.2 ounces of alcohol per week. She reports that she uses illicit drugs (Marijuana and Heroin).  Allergies: No Known Allergies  Medications: I have reviewed the patient's current medications.  Results for orders placed during the hospital encounter of 07/17/11 (from the past 48 hour(s))  CBC WITH DIFFERENTIAL     Status: Abnormal   Collection Time   07/17/11  8:37 PM   Component Value Range Comment   WBC 6.9  4.0 - 10.5 K/uL    RBC 3.97  3.87 - 5.11 MIL/uL    Hemoglobin 12.2  12.0 - 15.0 g/dL    HCT 35.7 (*) 36.0 - 46.0 %    MCV 89.9  78.0 - 100.0 fL    MCH 30.7  26.0 - 34.0 pg    MCHC 34.2  30.0 - 36.0 g/dL    RDW 14.3  11.5 - 15.5 %    Platelets 193  150 - 400 K/uL    Neutrophils Relative 58  43 - 77 %    Neutro Abs 4.0  1.7 - 7.7 K/uL    Lymphocytes Relative 34  12 - 46 %    Lymphs Abs 2.3  0.7 - 4.0 K/uL    Monocytes Relative 7  3 - 12 %    Monocytes Absolute 0.5  0.1 - 1.0 K/uL    Eosinophils Relative 2  0 - 5 %    Eosinophils Absolute 0.1  0.0 - 0.7 K/uL    Basophils Relative 0  0 - 1 %    Basophils Absolute 0.0  0.0 - 0.1 K/uL   BASIC METABOLIC PANEL     Status: Abnormal   Collection Time   07/17/11  8:37 PM  Component Value Range Comment   Sodium 136  135 - 145 mEq/L    Potassium 3.7  3.5 - 5.1 mEq/L    Chloride 98  96 - 112 mEq/L    CO2 22  19 - 32 mEq/L    Glucose, Bld 107 (*) 70 - 99 mg/dL    BUN 14  6 - 23 mg/dL    Creatinine, Ser 1.02  0.50 - 1.10 mg/dL    Calcium 9.2  8.4 - 10.5 mg/dL    GFR calc non Af Amer 52 (*) >90 mL/min    GFR calc Af Amer 60 (*) >90 mL/min   GLUCOSE, CAPILLARY     Status: Normal   Collection Time   07/18/11 12:26 AM      Component Value Range Comment   Glucose-Capillary 99  70 - 99 mg/dL    Comment 1 Notify RN     COMPREHENSIVE METABOLIC PANEL     Status: Abnormal   Collection Time   07/18/11  5:25 AM      Component Value Range Comment   Sodium 137  135 - 145 mEq/L    Potassium 3.6  3.5 - 5.1 mEq/L    Chloride 99  96 - 112 mEq/L    CO2 22  19 - 32 mEq/L    Glucose, Bld 137 (*) 70 - 99 mg/dL    BUN 11  6 - 23 mg/dL    Creatinine, Ser 0.80  0.50 - 1.10 mg/dL    Calcium 9.1  8.4 - 10.5 mg/dL    Total Protein 7.0  6.0 - 8.3 g/dL    Albumin 3.6  3.5 - 5.2 g/dL    AST 32  0 - 37 U/L    ALT 18  0 - 35 U/L    Alkaline Phosphatase 72  39 - 117 U/L    Total Bilirubin 0.4  0.3 - 1.2 mg/dL    GFR  calc non Af Amer 69 (*) >90 mL/min    GFR calc Af Amer 80 (*) >90 mL/min   CBC     Status: Abnormal   Collection Time   07/18/11  5:25 AM      Component Value Range Comment   WBC 11.3 (*) 4.0 - 10.5 K/uL    RBC 3.56 (*) 3.87 - 5.11 MIL/uL    Hemoglobin 11.0 (*) 12.0 - 15.0 g/dL    HCT 31.7 (*) 36.0 - 46.0 %    MCV 89.0  78.0 - 100.0 fL    MCH 30.9  26.0 - 34.0 pg    MCHC 34.7  30.0 - 36.0 g/dL    RDW 14.1  11.5 - 15.5 %    Platelets 202  150 - 400 K/uL   GLUCOSE, CAPILLARY     Status: Abnormal   Collection Time   07/18/11  7:15 AM      Component Value Range Comment   Glucose-Capillary 132 (*) 70 - 99 mg/dL    Comment 1 Notify RN      Comment 2 Documented in Chart       Dg Chest 1 View  07/17/2011  *RADIOLOGY REPORT*  Clinical Data: Fall  CHEST - 1 VIEW  Comparison: 02/28/2011  Findings: Mildly prominent cardiomediastinal contours are without interval change allowing for differences in technique/respiratory effort.  Mild right hemidiaphragm elevation.  Lungs are clear.  No pneumothorax.  No acute osseous finding.  IMPRESSION: Prominent cardiomediastinal contours are similar to prior.  No focal consolidation.  Original Report  Authenticated By: Suanne Marker, M.D.   Dg Hip Complete Left  07/17/2011  *RADIOLOGY REPORT*  Clinical Data: Left hip deformity status post fall.  LEFT HIP - COMPLETE 2+ VIEW  Comparison: None.  Findings: There is a comminuted femur fracture including a displaced fracture of the left femoral neck.  The femoral shaft is displaced proximally. Transversely oriented fracture line extends through the greater trochanter.  The femoral head remains seated within the acetabulum however is forced into an abducted position. Diffuse osteopenia.  No additional fracture identified.  IMPRESSION: Displaced left femoral neck fracture.  Transverse fracture through the left greater trochanter.  Original Report Authenticated By: Suanne Marker, M.D.    Review of Systems  All other  systems reviewed and are negative.   Blood pressure 152/78, pulse 100, temperature 98.5 F (36.9 C), temperature source Oral, resp. rate 18, SpO2 91.00%. Physical Exam  Constitutional: She is oriented to person, place, and time. She appears well-developed and well-nourished.  HENT:  Head: Atraumatic.  Eyes: EOM are normal.  Cardiovascular: Intact distal pulses.   Respiratory: Effort normal.  Musculoskeletal:       Left lower extremity is shortened and externally rotated. She has tenderness about the left hip and pain with any attempted movement of the left lower extremity. Distally she is neurovascularly intact.  Neurological: She is alert and oriented to person, place, and time.  Skin: Skin is warm and dry.  Psychiatric: She has a normal mood and affect.    Assessment/Plan: Left hip comminuted fracture involving the femoral neck and greater trochanter. I would like to get a CT scan to further evaluate the fracture in terms of planning for possible hemiarthroplasty versus ORIF. She will remain n.p.o. for surgery this afternoon. Appreciate medical management. Hold anticoagulation for now. Postoperatively she will have Lovenox as an inpatient and then transitioned to aspirin as an outpatient.  Nita Sells 07/18/2011, 7:45 AM

## 2011-07-18 NOTE — Op Note (Signed)
Procedure(s): ARTHROPLASTY BIPOLAR HIP Procedure Note  [name] female 78 y.o. 07/18/2011  Procedure(s) and Anesthesia Type:    * HEMIARTHROPLASTY L HIP - General Open reduction internal fixation left greater trochanter fracture  Surgeon(s) and Role:    * Nita Sells, MD - Primary    * Johnny Bridge, MD - Assisting   Indications:  78 y.o. female s/p fall with comminuted left hip fracture. Indicated for surgery to promote early ambulation, pain control and prevent complications of bed rest.     Surgeon: Nita Sells   Assistants: Marchia Bond M.D.  Anesthesia: General endotracheal anesthesia   Procedure Detail  ARTHROPLASTY BIPOLAR HIP  Findings: A size 4 cemented [place] hemiarthroplasty was used with a +0 neck length with 2x 2 mm cables to fix the greater tuberosity fracture. Excellent stability was achieved.  Estimated Blood Loss:  250 cc         Drains: none  Blood Given: none          Specimens: none        Complications:  * No complications entered in OR log *         Disposition: PACU - hemodynamically stable.         Condition: stable    Procedure:  The patient was identified in the preoperative  holding area where I personally marked the operative site after  verifying site side and procedure with the patient. She was taken back  to the operating room where general anesthesia was induced without  Complication. The patient was placed in lateral decubitus position with the  left side up. The left lower extremity was then prepped and draped in standard sterile fashion. The patient did receive IV antibiotics prior to the  incision.   After the appropriate time-out, an approximately 12-cm  incision was made over the posterior third of the greater trochanter.  Dissection was carried down to the fascia which was split longitudinally  in line with the incision. The piriformis was tagged and the external  rotators were then  taken down off the posterior greater trochanter in 1  sheath with the posterior capsule. The fracture was exposed. Posterior  capsule and external rotators were split just below the piriformis down  to the level of the acetabulum. Great care was taken to protect the  sciatic nerve. She was noted to have greater tuberosity fracture with a coronal split. The medial calcar was noted to be intact for approximately one thumb length above the lesser trochanter. The lateral femoral neck was comminuted. Femoral neck cut did not need to be made. The proximal femur was then prepared by first using an intramedullary  canal finder,  and then sequentially broaching from 1 to 4.   The head with 0 offset neck was then placed and a trial reduction was performed. It was felt  to be excellent in terms of the soft tissue tension. I was able to  bring up to 90 degrees of flexion and 70 degrees internal rotation  without any instability. Leg lengths were felt to be appropriate. It was noted that when the hip was reduced the greater trochanter fractures were held in anatomic alignment. The trochanter fragments were in continuity with the vastus lateralis and the abductors tell them against their anatomic location. The  hip was dislocated. The broach was then removed. The cement restrictor  was then placed and the canal was prepared with pulse lavage with a  brush. The size 4 stem was  then cemented into place in approximately 15-20  degrees anteversion. It was held until the cement was hardened and  cool. Using the cable system 2 2 mm cables were passed  Around the greater trochanter fragments with one passing above the lesser trochanter and one below crossing. The head was then placed and impacted. It  was then reduced after ensuring that there was nothing in the  acetabulum. The hip reduced nicely and again it was taken through the  trial. I was able to flex to 90 degrees, internal rotation to 70  without any  instability. The 2 cables were then sequentially tightened and crimped. The greater trochanter fragments reduced anatomically. Fixation was excellent. Soft tissue tension was excellent and lengths  were felt to be equal. The joint was then copiously irrigated with  normal saline with pulse lavage and then the external rotators were  repaired using Ethibond sutures through the greater trochanter and tied  over a bone bridge. The deep fascia was then closed using #1 Vicryl in  running fashion proximally and distally. The skin was then closed using  2-0 Vicryl in deep dermal layer and staples for skin closure. Sterile  dressings were then applied including Mepilex dressing. The patient was  then placed in an abduction pillow, rolled into supine position and  extubated. He was then transferred to the PACU in stable condition.    POSTOPERATIVE PLAN: The patient will be touchdown weightbearing on the operative Extremity with posterior hip precautions and will have DVT prophylaxis of Lovenox while in the hospital and transition to enteric-coated aspirin for 2 weeks postoperatively once discharged.Marland Kitchen

## 2011-07-18 NOTE — Progress Notes (Signed)
Pt arrived to unit 5NT around 0035 via stretcher. Pt transferred to bed with moderate pain score of 6 out of 10. LLE outward rotated, +CMS, limited movement due to pain. Currently NPO per orders. Dr. Tamera Punt, on-call, notified of patients arrival in which he was aware of her admit. Pt currently resting comfortably. Will continue to monitor.

## 2011-07-18 NOTE — Preoperative (Signed)
Beta Blockers   Reason not to administer Beta Blockers:Not Applicable 

## 2011-07-18 NOTE — Anesthesia Postprocedure Evaluation (Signed)
  Anesthesia Post-op Note  Patient: [name]  Procedure(s) Performed: Procedure(s) (LRB): ARTHROPLASTY BIPOLAR HIP (Left)  Patient Location: PACU  Anesthesia Type: General  Level of Consciousness: awake, alert  and oriented  Airway and Oxygen Therapy: Patient Spontanous Breathing and Patient connected to nasal cannula oxygen  Post-op Pain: mild  Post-op Assessment: Post-op Vital signs reviewed  Post-op Vital Signs: Reviewed  Complications: No apparent anesthesia complications

## 2011-07-19 DIAGNOSIS — D62 Acute posthemorrhagic anemia: Secondary | ICD-10-CM | POA: Diagnosis not present

## 2011-07-19 LAB — BASIC METABOLIC PANEL
GFR calc Af Amer: 66 mL/min — ABNORMAL LOW (ref >90)
GFR calc non Af Amer: 57 mL/min — ABNORMAL LOW (ref >90)
Potassium: 4.3 mEq/L (ref 3.5–5.1)
Sodium: 138 mEq/L (ref 135–145)

## 2011-07-19 LAB — CBC
HCT: 23.2 % — ABNORMAL LOW (ref 36.0–46.0)
Hemoglobin: 7.6 g/dL — ABNORMAL LOW (ref 12.0–15.0)
MCHC: 34.7 g/dL (ref 30.0–36.0)
MCHC: 34.9 g/dL (ref 30.0–36.0)
MCV: 91.3 fL (ref 78.0–100.0)
Platelets: 158 10*3/uL (ref 150–400)
RDW: 14.1 % (ref 11.5–15.5)
RDW: 14.4 % (ref 11.5–15.5)

## 2011-07-19 LAB — GLUCOSE, CAPILLARY: Glucose-Capillary: 146 mg/dL — ABNORMAL HIGH (ref 70–99)

## 2011-07-19 MED ORDER — FUROSEMIDE 10 MG/ML IJ SOLN
20.0000 mg | Freq: Once | INTRAMUSCULAR | Status: AC
  Administered 2011-07-19: 20 mg via INTRAVENOUS
  Filled 2011-07-19: qty 2

## 2011-07-19 NOTE — Progress Notes (Signed)
PATIENT ID: [name]   1 Day Post-Op Procedure(s) (LRB): ARTHROPLASTY BIPOLAR HIP (Left)  Subjective: Pain well controlled. No new concerns.  Objective:  Filed Vitals:   07/19/11 0526  BP: 98/57  Pulse: 105  Temp: 99.6 F (37.6 C)  Resp: 16     Dressing clean dry and intact. Distally neurovascularly intact. No calf tenderness or swelling.  Labs:   [facility] 07/18/11 0525 07/17/11 2037  HGB 11.0* 12.2   Basename 07/18/11 0525 07/17/11 2037  WBC 11.3* 6.9  RBC 3.56* 3.97  HCT 31.7* 35.7*  PLT 202 193   Basename 07/18/11 0525 07/17/11 2037  NA 137 136  K 3.6 3.7  CL 99 98  CO2 22 22  BUN 11 14  CREATININE 0.80 1.02  GLUCOSE 137* 107*  CALCIUM 9.1 9.2    Assessment and Plan: Doing well postoperative day #1 status post left hip hemiarthroplasty with repair of the greater trochanter fracture Touchdown weightbearing left lower extremity with physical therapy and posterior hip precautions. Labs pending.  VTE proph: Lovenox while in the hospital, transition to enteric-coated aspirin twice daily once discharged for 2 weeks.

## 2011-07-19 NOTE — Progress Notes (Signed)
Orthopedic Tech Progress Note Patient Details:  [name] 06-13-1932 [id number]  Patient ID: [name], female   DOB: 07-Jun-1932, 78 y.o.   MRN: [id number] OHF applied to pt bed.  Tigerlily Christine T 07/19/2011, 1:38 PM

## 2011-07-19 NOTE — Progress Notes (Signed)
OT Cancellation Note  Treatment cancelled today due to medical issues with patient which prohibited therapy.  RN reports planning to tranfuse 3 units prbs due to hemoglobin of 8.1.  Will hold evaluation until tomorrow. Thanks!  07/19/2011 Darrol Jump OTR/L Pager [phone] Office [phone]

## 2011-07-19 NOTE — Progress Notes (Signed)
[name] is a 78 y.o. female admitted after a fall resulting from tripping on wet ground. She had ORIF of left greater trochanter by DR Tamera Punt yesterday. I have reviewed her chart, seen and examined her at bed side. Appreciate ortho. She denies any complaints. Her hemoglobin has declined to 7.6, from 12.2 on admission, if true.  1. Hip fracture   2. Closed left hip fracture   3. DM2 (diabetes mellitus, type 2)   4. HTN (hypertension)   5. Hyperlipidemia     Past Medical History  Diagnosis Date  . Anemia 04/03/2011  . Hypertension   . High cholesterol   . Type II diabetes mellitus   . Anginal pain   . Exertional dyspnea   . GERD (gastroesophageal reflux disease)   . 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 Depression   . 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 Blind left eye 1999  . Hip fracture 07/17/11    fall from 1-2 feet; left   Current Facility-Administered Medications  Medication Dose Route Frequency Provider Last Rate Last Dose  . 0.9 %  sodium chloride infusion   Intravenous STAT Maudry Diego, MD      . acetaminophen (OFIRMEV) IV 1,000 mg  1,000 mg Intravenous Once PRN Roberts Gaudy, MD      . acetaminophen (TYLENOL) tablet 650 mg  650 mg Oral Q6H PRN Rise Patience, MD       Or  . acetaminophen (TYLENOL) suppository 650 mg  650 mg Rectal Q6H PRN Rise Patience, MD      . carvedilol (COREG) tablet 3.125 mg  3.125 mg Oral BID WC Rise Patience, MD   3.125 mg at 07/19/11 0956  . ceFAZolin (ANCEF) IVPB 2 g/50 mL premix  2 g Intravenous Q6H Nita Sells, MD   2 g at 07/19/11 0514  . enoxaparin (LOVENOX) injection 30 mg  30 mg Subcutaneous Q12H Nita Sells, MD   30 mg at 07/19/11 0752  . felodipine (PLENDIL) 24 hr tablet 2.5 mg  2.5 mg Oral BID Rise Patience, MD   2.5 mg at 07/19/11 0956  . ferrous sulfate tablet 325 mg  325 mg Oral TID PC Nita Sells, MD   325 mg at 07/19/11 0752  . furosemide  (LASIX) injection 20 mg  20 mg Intravenous Once Eugena Rhue, MD      . hydrALAZINE (APRESOLINE) injection 10 mg  10 mg Intravenous Q4H PRN Rise Patience, MD      . HYDROcodone-acetaminophen [facility]) 5-325 MG per tablet 1-2 tablet  1-2 tablet Oral Q6H PRN Nita Sells, MD   2 tablet at 07/19/11 [phone]  . HYDROmorphone (DILAUDID) injection 0.5 mg  0.5 mg Intravenous Q4H PRN Rise Patience, MD   0.5 mg at 07/19/11 0511  . insulin aspart (novoLOG) injection 0-9 Units  0-9 Units Subcutaneous TID WC Rise Patience, MD   1 Units at 07/19/11 [phone]  . lisinopril (PRINIVIL,ZESTRIL) tablet 20 mg  20 mg Oral Daily Rise Patience, MD   20 mg at 07/19/11 0956  . menthol-cetylpyridinium (CEPACOL) lozenge 3 mg  1 lozenge Oral PRN Nita Sells, MD       Or  . phenol [facility]) mouth spray 1 spray  1 spray Mouth/Throat PRN Nita Sells, MD      . metoCLOPramide [facility]) tablet 5-10 mg  5-10 mg Oral Q8H PRN Nita Sells,  MD       Or  . metoCLOPramide (REGLAN) injection 5-10 mg  5-10 mg Intravenous Q8H PRN Nita Sells, MD      . morphine 2 MG/ML injection 0.5 mg  0.5 mg Intravenous Q2H PRN Nita Sells, MD      . ondansetron [facility]) tablet 4 mg  4 mg Oral Q6H PRN Rise Patience, MD       Or  . ondansetron [facility]) injection 4 mg  4 mg Intravenous Q6H PRN Rise Patience, MD   4 mg at 07/19/11 0848  . pantoprazole (PROTONIX) EC tablet 40 mg  40 mg Oral Q1200 Rise Patience, MD   40 mg at 07/18/11 1247  . pneumococcal 23 valent vaccine (PNU-IMMUNE) injection 0.5 mL  0.5 mL Intramuscular Tomorrow-1000 Maudry Diego, MD   0.5 mL at 07/19/11 0957  . simvastatin (ZOCOR) tablet 40 mg  40 mg Oral QPM Rise Patience, MD      . DISCONTD: 0.9 %  sodium chloride infusion   Intravenous Continuous Rise Patience, MD      . DISCONTD: acetaminophen (TYLENOL) suppository 650 mg  650 mg Rectal Q6H PRN Nita Sells, MD      . DISCONTD: acetaminophen (TYLENOL) tablet 650 mg  650 mg Oral Q6H PRN Nita Sells, MD      . DISCONTD: ceFAZolin (ANCEF) IVPB 2 g/50 mL premix  2 g Intravenous 60 min Pre-Op Nita Sells, MD      . DISCONTD: chlorhexidine (HIBICLENS) 4 % liquid 4 application  60 mL Topical Once Nita Sells, MD      . DISCONTD: HYDROmorphone (DILAUDID) injection 0.25-0.5 mg  0.25-0.5 mg Intravenous Q5 min PRN Roberts Gaudy, MD      . DISCONTD: lactated ringers infusion   Intravenous Continuous Roberts Gaudy, MD 20 mL/hr at 07/18/11 1706    . DISCONTD: ondansetron (ZOFRAN) injection 4 mg  4 mg Intravenous Once PRN Roberts Gaudy, MD      . DISCONTD: ondansetron [facility]) injection 4 mg  4 mg Intravenous Q6H PRN Nita Sells, MD      . DISCONTD: ondansetron [facility]) tablet 4 mg  4 mg Oral Q6H PRN Nita Sells, MD      . DISCONTD: sodium chloride irrigation 0.9 %    PRN Nita Sells, MD   3,000 mL at 07/18/11 1954   Facility-Administered Medications Ordered in Other Encounters  Medication Dose Route Frequency Provider Last Rate Last Dose  . DISCONTD: cefoTEtan (CEFOTAN) 2 g in dextrose 5 % 50 mL IVPB  2 g  Continuous PRN Valetta Fuller, CRNA   2 g at 07/18/11 1725  . DISCONTD: ePHEDrine injection    PRN Valetta Fuller, CRNA   5 mg at 07/18/11 2007  . DISCONTD: fentaNYL (SUBLIMAZE) injection    PRN Valetta Fuller, CRNA   25 mcg at 07/18/11 1940  . DISCONTD: glycopyrrolate (ROBINUL) injection    PRN Valetta Fuller, CRNA   0.4 mg at 07/18/11 2014  . DISCONTD: lactated ringers infusion    Continuous PRN Valetta Fuller, CRNA      . DISCONTD: lidocaine (cardiac) 100 mg/80ml (XYLOCAINE) 20 MG/ML injection 2%    PRN Valetta Fuller, CRNA   40 mg at 07/18/11 1745  . DISCONTD: neostigmine (PROSTIGMINE) injection   Intravenous PRN Valetta Fuller, CRNA   3 mg at 07/18/11 2014  . DISCONTD: ondansetron (ZOFRAN) injection  PRN Valetta Fuller, CRNA   4 mg at 07/18/11 2008  . DISCONTD: phenylephrine (NEO-SYNEPHRINE) injection    PRN Valetta Fuller, CRNA   40 mcg at 07/18/11 1907  . DISCONTD: propofol (DIPRIVAN) 10 mg/ml infusion    PRN Valetta Fuller, CRNA   90 mg at 07/18/11 1745  . DISCONTD: rocuronium [facility]) injection    PRN Valetta Fuller, CRNA   30 mg at 07/18/11 1745   No Known Allergies Principal Problem:  *Closed left hip fracture Active Problems:  HTN (hypertension)  DM2 (diabetes mellitus, type 2)  Hyperlipidemia  Anemia associated with acute blood loss   Vital signs in last 24 hours: Temp:  [97.3 F (36.3 C)-99.6 F (37.6 C)] 99.6 F (37.6 C) (07/06 0526) Pulse Rate:  [77-105] 105  (07/06 0526) Resp:  [14-20] 16  (07/06 0526) BP: (98-149)/(57-83) 128/61 mmHg (07/06 0945) SpO2:  [92 %-100 %] 95 % (07/06 0526) Weight change:  Last BM Date: 07/17/11  Intake/Output from previous day: 07/05 0701 - 07/06 0700 In: 3020 [P.O.:120; I.V.:2900] Out: 1075 [Urine:825; Blood:250] Intake/Output this shift:    Lab Results:  Basename 07/19/11 0728 07/18/11 0525  WBC 10.5 11.3*  HGB 7.6* 11.0*  HCT 21.9* 31.7*  PLT 158 202   BMET  Basename 07/19/11 0728 07/18/11 0525  NA 138 137  K 4.3 3.6  CL 103 99  CO2 23 22  GLUCOSE 155* 137*  BUN 13 11  CREATININE 0.94 0.80  CALCIUM 8.2* 9.1    Studies/Results: Dg Chest 1 View  07/17/2011  *RADIOLOGY REPORT*  Clinical Data: Fall  CHEST - 1 VIEW  Comparison: 02/28/2011  Findings: Mildly prominent cardiomediastinal contours are without interval change allowing for differences in technique/respiratory effort.  Mild right hemidiaphragm elevation.  Lungs are clear.  No pneumothorax.  No acute osseous finding.  IMPRESSION: Prominent cardiomediastinal contours are similar to prior.  No focal consolidation.  Original Report Authenticated By: Suanne Marker, M.D.   Dg Hip Complete Left  07/17/2011  *RADIOLOGY REPORT*  Clinical Data: Left hip deformity  status post fall.  LEFT HIP - COMPLETE 2+ VIEW  Comparison: None.  Findings: There is a comminuted femur fracture including a displaced fracture of the left femoral neck.  The femoral shaft is displaced proximally. Transversely oriented fracture line extends through the greater trochanter.  The femoral head remains seated within the acetabulum however is forced into an abducted position. Diffuse osteopenia.  No additional fracture identified.  IMPRESSION: Displaced left femoral neck fracture.  Transverse fracture through the left greater trochanter.  Original Report Authenticated By: Suanne Marker, M.D.   Dg Pelvis Portable  07/18/2011  *RADIOLOGY REPORT*  Clinical Data: Postop left hip fracture  PORTABLE PELVIS  Comparison: Plain films of 04/2011  Findings: Interval unipolar left hip arthroplasty for femoral neck fracture.  No fracture dislocation of the prosthetic.  Skin staples.  IMPRESSION:  Left hemiarthroplasty for left hip fracture.  No complication.  Original Report Authenticated By: Suzy Bouchard, M.D.   Ct Hip Left Wo Contrast  07/18/2011  *RADIOLOGY REPORT*  Clinical Data: Left hip fracture after fall.  CT OF THE LEFT HIP WITHOUT CONTRAST  Technique:  Multidetector CT imaging was performed according to the standard protocol. Multiplanar CT image reconstructions were also generated.  Comparison: Plain films 07/17/2011. PET CT scan 02/23/2006.  Findings: As seen on the patient's plain films, the patient has a comminuted left femoral neck fracture.  The fracture extends obliquely from the proximal  most portion of the superior neck superiorly in an inferior direction through the base of the femoral neck. The head neck junction of the femur is oriented at approximately 90 degrees.  The fracture also extends through the greater trochanter of the femur without displacement.  Lesser trochanter and intertrochanteric ridge are intact.  No other fracture is identified.  No bony destructive lesion is seen.  Imaged intrapelvic contents show partial visualization of an unchanged  fat containing lesion in the right adnexa consistent with an ovarian dermoid.  IMPRESSION: Mildly comminuted right hip fracture involves the greater trochanter as described.  Original Report Authenticated By: Arvid Right. Luther Parody, M.D.   Dg Chest Port 1 View  07/18/2011  *RADIOLOGY REPORT*  Clinical Data: Postop.  Central venous catheter placement.  PORTABLE CHEST - 1 VIEW 07/18/2011 2059 hours:  Comparison: Portable chest x-ray yesterday.  Two-view chest x-ray 02/28/2011.  Findings: Left subclavian central venous catheter tip at the junction of the innominate vein and SVC.  No evidence of pneumothorax or mediastinal hematoma.  Cardiac silhouette mildly enlarged but stable.  Lungs clear.  Pulmonary vascularity normal. Mild gaseous distention of the visualized stomach.  IMPRESSION:  1.  Left subclavian central venous catheter tip at the junction of the innominate vein and SVC.  No acute complicating features. 2.  No acute cardiopulmonary disease.  Original Report Authenticated By: Deniece Portela, M.D.    Medications: I have reviewed the patient's current medications.   Physical exam GENERAL- alert HEAD- normal atraumatic, no neck masses, normal thyroid, no jvd RESPIRATORY- appears well, vitals normal, no respiratory distress, acyanotic, normal RR, ear and throat exam is normal, neck free of mass or lymphadenopathy, chest clear, no wheezing, crepitations, rhonchi, normal symmetric air entry CVS- regular rate and rhythm, S1, S2 normal, no murmur, click, rub or gallop ABDOMEN- abdomen is soft without significant tenderness, masses, organomegaly or guarding NEURO- Grossly normal EXTREMITIES- No bleeding from left thigh.  Plan   * Closed left hip fracture- s/p orif. Doing ok. Appreciate ortho. For snf.  * Acute on chronic blood loss anemia- plan to transfuse 3 units prbc, if true.  * HTN (hypertension)- controlled on home  meds. * DM2 (diabetes mellitus, type 2)- controlled. *  Hyperlipidemia * dvt prophylaxis.   Nzinga Ferran 07/19/2011 11:05 AM Pager: [phone].

## 2011-07-19 NOTE — Evaluation (Signed)
Physical Therapy Evaluation Patient Details Name: [name] MRN: [id number] DOB: 1932-01-15 Today's Date: 07/19/2011 Time: [id number] PT Time Calculation (min): 22 min  PT Assessment / Plan / Recommendation Clinical Impression  Pt is a 78 y/o female admitted s/p fall with left hip hemiarthroplasty along with the below PT problem list.  Pt would benefit from acute PT to maximize independence and facilitate d/c to SNF.    PT Assessment  Patient needs continued PT services    Follow Up Recommendations  Skilled nursing facility;Supervision/Assistance - 24 hour    Barriers to Discharge Decreased caregiver support      Equipment Recommendations  Defer to next venue    Recommendations for Other Services     Frequency Min 6X/week    Precautions / Restrictions Precautions Precautions: Posterior Hip Precaution Booklet Issued: Yes (comment) Precaution Comments: Educated pt on 3/3 posterior hip precautions. Required Braces or Orthoses: Knee Immobilizer - Left Knee Immobilizer - Left: Other (comment) (In bed.) Restrictions Weight Bearing Restrictions: Yes LLE Weight Bearing: Touchdown weight bearing   Pertinent Vitals/Pain 0/10 with treatment.      Mobility  Bed Mobility Bed Mobility: Supine to Sit Supine to Sit: 1: +2 Total assist;HOB elevated (HOB 30 degrees.) Supine to Sit: Patient Percentage: 50% Details for Bed Mobility Assistance: Assist for left LE due to weakness as well as trunk to translate anterior.  Cues for sequence. Transfers Transfers: Sit to Stand;Stand to Sit Sit to Stand: 1: +2 Total assist;With upper extremity assist;From bed Sit to Stand: Patient Percentage: 50% Stand to Sit: 1: +2 Total assist;With upper extremity assist;To chair/3-in-1 Stand to Sit: Patient Percentage: 50% Details for Transfer Assistance: Assist for balance with cues for sequence including hand/left LE placement. Ambulation/Gait Ambulation/Gait Assistance: 1: +2 Total  assist Ambulation/Gait: Patient Percentage: 60% Ambulation Distance (Feet): 2 Feet Assistive device: Rolling walker Ambulation/Gait Assistance Details: Assist for balance and to off weight left LE due to [place] status.  Distance limited by dizziness.  Cues for tall posture and sequence to maintain TDWBing left LE. Gait Pattern: Step-to pattern;Decreased step length - left;Decreased stance time - left;Trunk flexed Stairs: No Wheelchair Mobility Wheelchair Mobility: No    Exercises Total Joint Exercises Ankle Circles/Pumps: AROM;Left;10 reps;Supine Quad Sets: AROM;Left;10 reps;Supine Heel Slides: AAROM;Left;10 reps;Supine   PT Diagnosis: Difficulty walking  PT Problem List: Decreased strength;Decreased activity tolerance;Decreased balance;Decreased mobility;Decreased knowledge of use of DME;Decreased knowledge of precautions PT Treatment Interventions: DME instruction;Gait training;Functional mobility training;Therapeutic activities;Therapeutic exercise;Balance training;Patient/family education   PT Goals Acute Rehab PT Goals PT Goal Formulation: With patient Time For Goal Achievement: 08/02/11 Potential to Achieve Goals: Good Pt will go Supine/Side to Sit: with supervision PT Goal: Supine/Side to Sit - Progress: Goal set today Pt will go Sit to Supine/Side: with supervision PT Goal: Sit to Supine/Side - Progress: Goal set today Pt will go Sit to Stand: with supervision PT Goal: Sit to Stand - Progress: Goal set today Pt will go Stand to Sit: with supervision PT Goal: Stand to Sit - Progress: Goal set today Pt will Ambulate: 51 - 150 feet;with supervision;with least restrictive assistive device PT Goal: Ambulate - Progress: Goal set today Pt will Perform Home Exercise Program: with supervision, verbal cues required/provided PT Goal: Perform Home Exercise Program - Progress: Goal set today  Visit Information  Last PT Received On: 07/19/11 Assistance Needed: +2 (Safety)     Subjective Data  Subjective: "Not hurting right now." Patient Stated Goal: Get well.   Prior Functioning  Home Living Lives With:  Alone Available Help at Discharge: Available PRN/intermittently Type of Home: House Home Access: Stairs to enter [employer] of Steps: 2 Entrance Stairs-Rails: None Home Layout: One level Bathroom Shower/Tub: Product/process development scientist: Standard Home Adaptive Equipment: None Prior Function Level of Independence: Independent Able to Take Stairs?: Yes Driving: No Vocation: Retired Corporate investment banker: No difficulties    Cognition  Overall Cognitive Status: Appears within functional limits for tasks assessed/performed Arousal/Alertness: Awake/alert Orientation Level: Appears intact for tasks assessed Behavior During Session: [facility] for tasks performed    Extremity/Trunk Assessment Right Upper Extremity Assessment RUE ROM/Strength/Tone: Within functional levels RUE Sensation: WFL - Light Touch RUE Coordination: WFL - gross/fine motor Left Upper Extremity Assessment LUE ROM/Strength/Tone: Within functional levels LUE Sensation: WFL - Light Touch LUE Coordination: WFL - gross/fine motor Right Lower Extremity Assessment RLE ROM/Strength/Tone: Within functional levels RLE Sensation: WFL - Light Touch RLE Coordination: WFL - gross/fine motor Left Lower Extremity Assessment LLE ROM/Strength/Tone: Deficits LLE ROM/Strength/Tone Deficits: 2/5 throughout. LLE Sensation: WFL - Light Touch LLE Coordination: WFL - gross/fine motor Trunk Assessment Trunk Assessment: Normal   Balance Balance Balance Assessed: No  End of Session PT - End of Session Equipment Utilized During Treatment: Gait belt Activity Tolerance: Patient tolerated treatment well;Other (comment) (Limited by dizziness.) Patient left: in chair;with call bell/phone within reach;with family/visitor present Nurse Communication: Mobility status  GP      Cyndia Bent 07/19/2011, 8:48 AM  07/19/2011 Cyndia Bent, PT, DPT [phone]

## 2011-07-20 LAB — CBC
HCT: 30.2 % — ABNORMAL LOW (ref 36.0–46.0)
MCV: 85.1 fL (ref 78.0–100.0)
RBC: 3.55 MIL/uL — ABNORMAL LOW (ref 3.87–5.11)
WBC: 11 10*3/uL — ABNORMAL HIGH (ref 4.0–10.5)

## 2011-07-20 LAB — TYPE AND SCREEN
ABO/RH(D): B POS
Unit division: 0

## 2011-07-20 LAB — GLUCOSE, CAPILLARY
Glucose-Capillary: 76 mg/dL (ref 70–99)
Glucose-Capillary: 100 mg/dL — ABNORMAL HIGH (ref 70–99)
Glucose-Capillary: 72 mg/dL (ref 70–99)

## 2011-07-20 LAB — URINALYSIS, ROUTINE W REFLEX MICROSCOPIC
Bilirubin Urine: NEGATIVE
Nitrite: NEGATIVE
Specific Gravity, Urine: 1.015 (ref 1.005–1.030)
Urobilinogen, UA: 1 mg/dL (ref 0.0–1.0)

## 2011-07-20 LAB — BASIC METABOLIC PANEL
BUN: 12 mg/dL (ref 6–23)
CO2: 24 mEq/L (ref 19–32)
Chloride: 104 mEq/L (ref 96–112)
Creatinine, Ser: 0.87 mg/dL (ref 0.50–1.10)

## 2011-07-20 LAB — PROTIME-INR: Prothrombin Time: 14.8 seconds (ref 11.6–15.2)

## 2011-07-20 LAB — URINE MICROSCOPIC-ADD ON

## 2011-07-20 MED ORDER — HYDROCODONE-ACETAMINOPHEN 5-325 MG PO TABS: 1.0000 | ORAL_TABLET | ORAL | Status: AC | PRN

## 2011-07-20 MED ORDER — METHOCARBAMOL 500 MG PO TABS
500.0000 mg | ORAL_TABLET | Freq: Three times a day (TID) | ORAL | Status: DC | PRN
  Administered 2011-07-20: 500 mg via ORAL
  Filled 2011-07-20: qty 1

## 2011-07-20 MED ORDER — METHOCARBAMOL 500 MG PO TABS: 500.0000 mg | ORAL_TABLET | Freq: Three times a day (TID) | ORAL | Status: AC | PRN

## 2011-07-20 NOTE — Progress Notes (Signed)
Clinical Social Work Department BRIEF PSYCHOSOCIAL ASSESSMENT 07/20/2011  Patient:  Kayla, Kirk     Account Number:  [id number]     Admit date:  07/17/2011  Clinical Social Worker:  Robbi Garter  Date/Time:  07/20/2011 08:58 AM  Referred by:  Physician  Date Referred:  07/19/2011 Referred for  SNF Placement   Other Referral:   Interview type:  Patient Other interview type:    PSYCHOSOCIAL DATA Living Status:  ALONE Admitted from facility:   Level of care:   Primary support name:  Tynisa/grandaughter Primary support relationship to patient:  FAMILY Degree of support available:   Adequate    CURRENT CONCERNS Current Concerns  Post-Acute Placement   Other Concerns:    SOCIAL WORK ASSESSMENT / PLAN CSW met with pt re: PT recommendation for SNF. Pt reports she lives alone with a granddaughter in [place] who assists as needed. CSW explained placement process and answered questions.  Pt requesting SNF search in [facility].  CSW to complete FL2 and  initiate SNF search.  Weekday CSW to f/u with offers  FL2 on pt's shadow chart for MD signature.   Assessment/plan status:  Information/Referral to [employer] Other assessment/ plan:   Information/referral to community resources:   SNF list    PATIENT'S/FAMILY'S RESPONSE TO PLAN OF CARE: Pt reports agreeable to ST SNF in order to increase strength and independence with mobility prior to return home. Pt verbalized understanding of SNF placement purpose/process and appreciation for CSW assist.        Wandra Feinstein, MSW, LCSWA [phone] (weekend)

## 2011-07-20 NOTE — Progress Notes (Signed)
Occupational Therapy Evaluation Patient Details Name: [name] MRN: [id number] DOB: [birth year] Today's Date: 07/20/2011 Time: [id number] OT Time Calculation (min): 21 min  OT Assessment / Plan / Recommendation Clinical Impression  Pt admitted s/p fall with left hip hemiarthroplasty thus affecting PLOF. Will benefit from acute OT services to address below problem list in prep for d/c to ST SNF.    OT Assessment  Patient needs continued OT Services    Follow Up Recommendations  Skilled nursing facility    Barriers to Discharge Decreased caregiver support    Equipment Recommendations  Defer to next venue    Recommendations for Other Services    Frequency  Min 2X/week    Precautions / Restrictions Precautions Precautions: Posterior Hip Precaution Booklet Issued: Yes (comment) Precaution Comments: Pt able to recall 1/3 hip precautions. Edcuated pt on 3/3 hip precautions.  At end of session,  pt only able to recall 1/3. Required Braces or Orthoses: Knee Immobilizer - Left Knee Immobilizer - Left: Other (comment) (in bed) Restrictions Weight Bearing Restrictions: Yes LLE Weight Bearing: Touchdown weight bearing   Pertinent Vitals/Pain See vitals    ADL  Lower Body Bathing: Simulated;+1 Total assistance Where Assessed - Lower Body Bathing: Supported sit to stand Upper Body Dressing: Performed;Set up Where Assessed - Upper Body Dressing: Unsupported sitting Lower Body Dressing: Performed;+1 Total assistance Where Assessed - Lower Body Dressing: Supported sit to stand Toilet Transfer: Simulated;Minimal assistance Toilet Transfer Method: Sit to Loss adjuster, chartered: Other (comment) (EOB to chair) Equipment Used: Gait belt;Rolling walker Transfers/Ambulation Related to ADLs: Min assist with RW.  Max verbal cueing for sequencing and safety with RW due to impusliveness. ADL Comments: Decreased independnece due to [place] status and post. hip precautions. Will benefit  from AE.    OT Diagnosis: Generalized weakness;Acute pain;Cognitive deficits  OT Problem List: Decreased activity tolerance;Impaired balance (sitting and/or standing);Decreased safety awareness;Decreased knowledge of use of DME or AE;Decreased knowledge of precautions;Pain OT Treatment Interventions: Self-care/ADL training;DME and/or AE instruction;Therapeutic activities;Patient/family education;Balance training;Cognitive remediation/compensation   OT Goals Acute Rehab OT Goals OT Goal Formulation: With patient Time For Goal Achievement: 08/03/11 Potential to Achieve Goals: Good ADL Goals Pt Will Perform Grooming: with supervision;Standing at sink ADL Goal: Grooming - Progress: Goal set today Pt Will Perform Lower Body Bathing: with supervision;Sit to stand from chair;Sit to stand from bed;with adaptive equipment ADL Goal: Lower Body Bathing - Progress: Goal set today Pt Will Perform Lower Body Dressing: with supervision;with adaptive equipment;Sit to stand from chair;Sit to stand from bed ADL Goal: Lower Body Dressing - Progress: Goal set today Pt Will Transfer to Toilet: with supervision;with DME;Ambulation;Comfort height toilet;Maintaining hip precautions;Maintaining weight bearing status ADL Goal: Toilet Transfer - Progress: Goal set today Pt Will Perform Toileting - Clothing Manipulation: with supervision;Standing ADL Goal: Toileting - Clothing Manipulation - Progress: Goal set today Pt Will Perform Toileting - Hygiene: with supervision;Sit to stand from 3-in-1/toilet ADL Goal: Toileting - Hygiene - Progress: Goal set today Miscellaneous OT Goals Miscellaneous OT Goal #1: Pt will perform bed mobility with supervision while maintaining posterior hip precautions as precursor for EOB ADLs. OT Goal: Miscellaneous Goal #1 - Progress: Goal set today Miscellaneous OT Goal #2: Pt will independently verbalize and generalize 3/3 posterior hip precautions during all ADL activity. OT Goal:  Miscellaneous Goal #2 - Progress: Goal set today  Visit Information  Last OT Received On: 07/20/11    Subjective Data      Prior Functioning  Home Living Lives With: Alone Available  Help at Discharge: Available PRN/intermittently Type of Home: House Home Access: Stairs to enter [employer] of Steps: 2 Entrance Stairs-Rails: None Home Layout: One level Bathroom Shower/Tub: Product/process development scientist: Standard Home Adaptive Equipment: None Prior Function Level of Independence: Independent Able to Take Stairs?: Yes Driving: No Vocation: Retired Corporate investment banker: No difficulties    Cognition  Overall Cognitive Status: Impaired Area of Impairment: Safety/judgement Arousal/Alertness: Awake/alert Orientation Level: Appears intact for tasks assessed Behavior During Session: [facility] for tasks performed Safety/Judgement: Impulsive Safety/Judgement - Other Comments: Requires constant max verbal cueing for sequencing and to slow down.    Extremity/Trunk Assessment Right Upper Extremity Assessment RUE ROM/Strength/Tone: Within functional levels Left Upper Extremity Assessment LUE ROM/Strength/Tone: Within functional levels   Mobility Bed Mobility Bed Mobility: Supine to Sit;Sitting - Scoot to Edge of Bed Supine to Sit: 4: Min assist;HOB elevated;With rails Sitting - Scoot to Edge of Bed: 4: Min guard Details for Bed Mobility Assistance: Assist to L LE due pt attempting to rotate LLE inward. Transfers Transfers: Sit to Stand;Stand to Sit Sit to Stand: 4: Min assist;From bed;With upper extremity assist Stand to Sit: 4: Min assist;To chair/3-in-1;With armrests;With upper extremity assist Details for Transfer Assistance: Assist for balance and VC for sequencing and safe hand placement.   Exercise    Balance    End of Session OT - End of Session Equipment Utilized During Treatment: Gait belt Activity Tolerance: Patient limited by fatigue Patient  left: in chair;with call bell/phone within reach Nurse Communication: Mobility status  GO    07/20/2011 Darrol Jump OTR/L Pager [phone] Office [phone]  Darrol Jump 07/20/2011, 12:37 PM

## 2011-07-20 NOTE — Progress Notes (Signed)
SUBJECTIVE Feels ok. No complaints. She says her oxygen was dropping yesterday and wonders why that is.   1. Hip fracture   2. Closed left hip fracture   3. DM2 (diabetes mellitus, type 2)   4. HTN (hypertension)   5. Hyperlipidemia     Past Medical History  Diagnosis Date  . Anemia 04/03/2011  . Hypertension   . High cholesterol   . Type II diabetes mellitus   . Anginal pain   . Exertional dyspnea   . GERD (gastroesophageal reflux disease)   . 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 Depression   . 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 Blind left eye 1999  . Hip fracture 07/17/11    fall from 1-2 feet; left   Current Facility-Administered Medications  Medication Dose Route Frequency Provider Last Rate Last Dose  . acetaminophen (TYLENOL) tablet 650 mg  650 mg Oral Q6H PRN Rise Patience, MD       Or  . acetaminophen (TYLENOL) suppository 650 mg  650 mg Rectal Q6H PRN Rise Patience, MD      . carvedilol (COREG) tablet 3.125 mg  3.125 mg Oral BID WC Rise Patience, MD   3.125 mg at 07/20/11 0929  . enoxaparin (LOVENOX) injection 30 mg  30 mg Subcutaneous Q12H Nita Sells, MD   30 mg at 07/20/11 [id number]  . felodipine (PLENDIL) 24 hr tablet 2.5 mg  2.5 mg Oral BID Rise Patience, MD   2.5 mg at 07/20/11 0930  . ferrous sulfate tablet 325 mg  325 mg Oral TID PC Nita Sells, MD   325 mg at 07/20/11 0930  . hydrALAZINE (APRESOLINE) injection 10 mg  10 mg Intravenous Q4H PRN Rise Patience, MD      . HYDROcodone-acetaminophen [facility]) 5-325 MG per tablet 1-2 tablet  1-2 tablet Oral Q6H PRN Nita Sells, MD   2 tablet at 07/20/11 [phone]  . HYDROmorphone (DILAUDID) injection 0.5 mg  0.5 mg Intravenous Q4H PRN Rise Patience, MD   0.5 mg at 07/19/11 0511  . insulin aspart (novoLOG) injection 0-9 Units  0-9 Units Subcutaneous TID WC Rise Patience, MD   1 Units at 07/19/11 1251  . lisinopril  (PRINIVIL,ZESTRIL) tablet 20 mg  20 mg Oral Daily Rise Patience, MD   20 mg at 07/20/11 0929  . menthol-cetylpyridinium (CEPACOL) lozenge 3 mg  1 lozenge Oral PRN Nita Sells, MD       Or  . phenol [facility]) mouth spray 1 spray  1 spray Mouth/Throat PRN Nita Sells, MD      . methocarbamol (ROBAXIN) tablet 500 mg  500 mg Oral Q8H PRN Nita Sells, MD   500 mg at 07/20/11 1056  . metoCLOPramide (REGLAN) tablet 5-10 mg  5-10 mg Oral Q8H PRN Nita Sells, MD       Or  . metoCLOPramide [facility]) injection 5-10 mg  5-10 mg Intravenous Q8H PRN Nita Sells, MD      . morphine 2 MG/ML injection 0.5 mg  0.5 mg Intravenous Q2H PRN Nita Sells, MD      . ondansetron [facility]) tablet 4 mg  4 mg Oral Q6H PRN Rise Patience, MD       Or  . ondansetron [facility]) injection 4 mg  4 mg Intravenous Q6H PRN Rise Patience, MD   4 mg at 07/19/11 0848  .  pantoprazole (PROTONIX) EC tablet 40 mg  40 mg Oral Q1200 Rise Patience, MD   40 mg at 07/20/11 1056  . simvastatin (ZOCOR) tablet 40 mg  40 mg Oral QPM Rise Patience, MD   40 mg at 07/19/11 1717   No Known Allergies Principal Problem:  *Closed left hip fracture Active Problems:  HTN (hypertension)  DM2 (diabetes mellitus, type 2)  Hyperlipidemia  Anemia associated with acute blood loss   Vital signs in last 24 hours: Temp:  [98.5 F (36.9 C)-99.7 F (37.6 C)] 98.5 F (36.9 C) (07/07 0620) Pulse Rate:  [65-79] 76  (07/07 0620) Resp:  [16-20] 20  (07/07 1140) BP: (89-166)/(43-74) 110/46 mmHg (07/07 0620) SpO2:  [97 %-100 %] 97 % (07/07 1140) Weight change:  Last BM Date: 07/17/11  Intake/Output from previous day: 07/06 0701 - 07/07 0700 In: 1985 [P.O.:360; I.V.:250; Blood:1375] Out: 1850 [Urine:1850] Intake/Output this shift:    Lab Results:  Basename 07/20/11 0452 07/19/11 1150  WBC 11.0* 9.6  HGB 10.8* 8.1*  HCT 30.2* 23.2*  PLT 111* 162     BMET  Basename 07/20/11 0452 07/19/11 0728  NA 137 138  K 3.7 4.3  CL 104 103  CO2 24 23  GLUCOSE 83 155*  BUN 12 13  CREATININE 0.87 0.94  CALCIUM 8.4 8.2*    Studies/Results: Dg Pelvis Portable  07/18/2011  *RADIOLOGY REPORT*  Clinical Data: Postop left hip fracture  PORTABLE PELVIS  Comparison: Plain films of 04/2011  Findings: Interval unipolar left hip arthroplasty for femoral neck fracture.  No fracture dislocation of the prosthetic.  Skin staples.  IMPRESSION:  Left hemiarthroplasty for left hip fracture.  No complication.  Original Report Authenticated By: Suzy Bouchard, M.D.   Dg Chest Port 1 View  07/18/2011  *RADIOLOGY REPORT*  Clinical Data: Postop.  Central venous catheter placement.  PORTABLE CHEST - 1 VIEW 07/18/2011 2059 hours:  Comparison: Portable chest x-ray yesterday.  Two-view chest x-ray 02/28/2011.  Findings: Left subclavian central venous catheter tip at the junction of the innominate vein and SVC.  No evidence of pneumothorax or mediastinal hematoma.  Cardiac silhouette mildly enlarged but stable.  Lungs clear.  Pulmonary vascularity normal. Mild gaseous distention of the visualized stomach.  IMPRESSION:  1.  Left subclavian central venous catheter tip at the junction of the innominate vein and SVC.  No acute complicating features. 2.  No acute cardiopulmonary disease.  Original Report Authenticated By: Deniece Portela, M.D.    Medications: I have reviewed the patient's current medications.   Physical exam GENERAL- alert HEAD- normal atraumatic, no neck masses, normal thyroid, no jvd RESPIRATORY- appears well, vitals normal, no respiratory distress, acyanotic, normal RR, ear and throat exam is normal, neck free of mass or lymphadenopathy, chest clear, no wheezing, crepitations, rhonchi, normal symmetric air entry CVS- regular rate and rhythm, S1, S2 normal, no murmur, click, rub or gallop ABDOMEN- abdomen is soft without significant tenderness, masses,  organomegaly or guarding NEURO- Grossly normal EXTREMITIES- extremities normal, atraumatic, no cyanosis or edema  Plan  * Closed left hip fracture- s/p orif. Doing ok. Appreciate ortho. For snf.  * Acute on chronic blood loss anemia- responded to prbc transfusion appropriately. * HTN (hypertension)- controlled on home meds.  * DM2 (diabetes mellitus, type 2)- controlled.  * Hyperlipidemia. * leukocytosis- seems isolated. Check ua, monitor. * dvt prophylaxis.     [name] 07/20/2011 1:32 PM Pager: [phone].

## 2011-07-20 NOTE — Progress Notes (Signed)
PATIENT ID: [name]   2 Days Post-Op Procedure(s) (LRB): ARTHROPLASTY BIPOLAR HIP (Left)  Subjective: The patient is doing well. She had 3 units transfused yesterday and feels better. She is having some muscle spasm around her hip. She walked a few feet with a walker this morning. She is now sitting up in a chair.  Objective:  Filed Vitals:   07/20/11 0800  BP:   Pulse:   Temp:   Resp: 18     Her dressing is clean dry and intact. There is mild swelling around the hip. Distally she is neurovascularly intact. There is no calf tenderness or swelling.  Labs:   [facility] 07/20/11 0452 07/19/11 1150 07/19/11 0728 07/18/11 0525 07/17/11 2037  HGB 10.8* 8.1* 7.6* 11.0* 12.2   Basename 07/20/11 0452 07/19/11 1150  WBC 11.0* 9.6  RBC 3.55* 2.54*  HCT 30.2* 23.2*  PLT 111* 162   Basename 07/20/11 0452 07/19/11 0728  NA 137 138  K 3.7 4.3  CL 104 103  CO2 24 23  BUN 12 13  CREATININE 0.87 0.94  GLUCOSE 83 155*  CALCIUM 8.4 8.2*    Assessment and Plan: Doing well postoperative day #2 status post left hip hemiarthroplasty with repair of greater trochanter fracture Continue touchdown weightbearing on the left side with a walker. Will add Robaxin for muscle spasm  VTE proph: Lovenox while in the hospital with SCDs, transitioned to enteric-coated aspirin twice daily upon discharge

## 2011-07-20 NOTE — Progress Notes (Signed)
Physical Therapy Treatment Patient Details Name: [name] MRN: [id number] DOB: [birth year] Today's Date: 07/20/2011 Time: 0820-0839 PT Time Calculation (min): 19 min  PT Assessment / Plan / Recommendation Comments on Treatment Session  overall pt physically moved fairly well but did require max directional cues for safe technique & reinforcement of hip precautions    Follow Up Recommendations  Skilled nursing facility;Supervision/Assistance - 24 hour    Barriers to Discharge        Equipment Recommendations  Defer to next venue    Recommendations for Other Services    Frequency Min 6X/week   Plan Discharge plan remains appropriate    Precautions / Restrictions Precautions Precautions: Posterior Hip Precaution Booklet Issued: Yes (comment) Precaution Comments: Pt able to recall 1/3 hip precautions. Edcuated pt on 3/3 hip precautions.  At end of session,  pt only able to recall 1/3. Required Braces or Orthoses: Knee Immobilizer - Left Knee Immobilizer - Left: Other (comment) (in bed) Restrictions Weight Bearing Restrictions: Yes LLE Weight Bearing: Touchdown weight bearing    Pertinent Vitals/Pain No pain reported    Mobility  Bed Mobility Bed Mobility: Supine to Sit;Sitting - Scoot to Edge of Bed Supine to Sit: 4: Min assist;HOB elevated;With rails Sitting - Scoot to Edge of Bed: 4: Min guard Details for Bed Mobility Assistance: Max cueing to reinforce "no internal rotation".  Assist to L LE due pt attempting to rotate LLE inward. Transfers Transfers: Sit to Stand;Stand to Sit Sit to Stand: 4: Min assist;From bed;With upper extremity assist Stand to Sit: 4: Min assist;To chair/3-in-1;With armrests;With upper extremity assist Details for Transfer Assistance: Assist for balance and VC for sequencing and safe hand placement. Ambulation/Gait Ambulation/Gait Assistance: 4: Min assist Ambulation Distance (Feet): 10 Feet Assistive device: Rolling walker Ambulation/Gait  Assistance Details: Cues for sequencing, TWBing, use of UE's to off weight LLE.  Distance limited due to fatigue.   Gait Pattern: Step-to pattern;Decreased stance time - left;Decreased step length - left;Decreased step length - right Stairs: No Wheelchair Mobility Wheelchair Mobility: No    Exercises Total Joint Exercises Ankle Circles/Pumps: AROM;Left;10 reps;Supine Quad Sets: AROM;Left;10 reps;Supine Heel Slides: AAROM;Left;10 reps;Supine     PT Goals Acute Rehab PT Goals Time For Goal Achievement: 08/02/11 Potential to Achieve Goals: Good PT Goal: Supine/Side to Sit - Progress: Progressing toward goal PT Goal: Sit to Stand - Progress: Progressing toward goal PT Goal: Stand to Sit - Progress: Progressing toward goal PT Goal: Ambulate - Progress: Progressing toward goal  Visit Information  Last PT Received On: 07/20/11 Assistance Needed: +2    Subjective Data      Cognition  Overall Cognitive Status: Impaired Area of Impairment: Safety/judgement Arousal/Alertness: Awake/alert Orientation Level: Appears intact for tasks assessed Behavior During Session: [facility] for tasks performed Safety/Judgement: Impulsive Safety/Judgement - Other Comments: Requires constant max verbal cueing for sequencing and to slow down.    Balance  Balance Balance Assessed: No  End of Session PT - End of Session Equipment Utilized During Treatment: Gait belt Activity Tolerance: Patient tolerated treatment well;Patient limited by fatigue Patient left: in chair;with call bell/phone within reach Nurse Communication: Mobility status    Sarajane Marek, [place] [phone] 07/20/2011

## 2011-07-20 NOTE — Progress Notes (Addendum)
Clinical Social Work Department CLINICAL SOCIAL WORK PLACEMENT NOTE 07/20/2011  Patient:  [name], [name]  Account Number:  [id number] Admit date:  07/17/2011  Clinical Social Worker:  Robbi Garter  Date/time:  07/20/2011 08:58 AM  Clinical Social Work is seeking post-discharge placement for this patient at the following level of care:   SKILLED NURSING   (*CSW will update this form in Epic as items are completed)   07/20/2011  Patient/family provided with [place] Department of Clinical Social Work's list of facilities offering this level of care within the geographic area requested by the patient (or if unable, by the patient's family).  07/20/2011  Patient/family informed of their freedom to choose among providers that offer the needed level of care, that participate in Medicare, Medicaid or managed care program needed by the patient, have an available bed and are willing to accept the patient.  07/20/2011  Patient/family informed of [facility]' ownership interest in [facility], as well as of the fact that they are under no obligation to receive care at this facility.  PASARR submitted to EDS on 07/20/2011 PASARR number received from EDS on 07/21/2011   (DTC)  FL2 transmitted to all facilities in geographic area requested by pt/family on  07/20/2011 FL2 transmitted to all facilities within larger geographic area on   Patient informed that his/her managed care company has contracts with or will negotiate with  certain facilities, including the following:  [facility]  (DTC)   Patient/family informed of bed offers received: 07/21/2011  (DTC) Patient chooses bed at [place]  ([place]) Physician recommends and patient chooses bed at    Patient to be transferred to [facility]  on  07/22/2011  (DTC) Patient to be transferred to facility by ambulance  ([place])  (DTC)  The following physician request were entered in Epic:   Additional  Comments: Wandra Feinstein, MSW, Latanya Presser [phone] (weekend)  Patient is pleased with d/c plan.  She will notify her granddaughter.  Contacted SNF and notified patient's nurse of d/c plan.  Medicaid prior auth  Will need to be requested; will forward to [facility] and to SNF.   Lorie Phenix. [place], [place]

## 2011-07-21 ENCOUNTER — Inpatient Hospital Stay (HOSPITAL_COMMUNITY): Payer: PRIVATE HEALTH INSURANCE

## 2011-07-21 DIAGNOSIS — N390 Urinary tract infection, site not specified: Secondary | ICD-10-CM | POA: Diagnosis present

## 2011-07-21 LAB — COMPREHENSIVE METABOLIC PANEL
AST: 32 U/L (ref 0–37)
BUN: 8 mg/dL (ref 6–23)
CO2: 26 mEq/L (ref 19–32)
Calcium: 8.5 mg/dL (ref 8.4–10.5)
Creatinine, Ser: 0.7 mg/dL (ref 0.50–1.10)
GFR calc Af Amer: >90 mL/min (ref >90)
GFR calc non Af Amer: 81 mL/min — ABNORMAL LOW (ref >90)
Glucose, Bld: 98 mg/dL (ref 70–99)

## 2011-07-21 LAB — CBC
HCT: 30.3 % — ABNORMAL LOW (ref 36.0–46.0)
MCH: 29.8 pg (ref 26.0–34.0)
MCV: 85.1 fL (ref 78.0–100.0)
Platelets: 122 10*3/uL — ABNORMAL LOW (ref 150–400)
RBC: 3.56 MIL/uL — ABNORMAL LOW (ref 3.87–5.11)

## 2011-07-21 LAB — GLUCOSE, CAPILLARY
Glucose-Capillary: 92 mg/dL (ref 70–99)
Glucose-Capillary: 122 mg/dL — ABNORMAL HIGH (ref 70–99)

## 2011-07-21 MED ORDER — POTASSIUM CHLORIDE 20 MEQ/15ML (10%) PO LIQD
40.0000 meq | Freq: Once | ORAL | Status: AC
  Administered 2011-07-21: 40 meq via ORAL
  Filled 2011-07-21: qty 30

## 2011-07-21 MED ORDER — LEVOFLOXACIN IN D5W 750 MG/150ML IV SOLN
750.0000 mg | INTRAVENOUS | Status: DC
  Administered 2011-07-21 – 2011-07-22 (×2): 750 mg via INTRAVENOUS
  Filled 2011-07-21 (×3): qty 150

## 2011-07-21 NOTE — Progress Notes (Signed)
Patient had fever- 100.78F this morning. UA suggests UTI. CXR ok.   SUBJECTIVE Says she feels ok.   1. Hip fracture   2. Closed left hip fracture   3. DM2 (diabetes mellitus, type 2)   4. HTN (hypertension)   5. Hyperlipidemia     Past Medical History  Diagnosis Date  . Anemia 04/03/2011  . Hypertension   . High cholesterol   . Type II diabetes mellitus   . Anginal pain   . Exertional dyspnea   . GERD (gastroesophageal reflux disease)   . 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 Depression   . 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 Blind left eye 1999  . Hip fracture 07/17/11    fall from 1-2 feet; left   Current Facility-Administered Medications  Medication Dose Route Frequency Provider Last Rate Last Dose  . acetaminophen (TYLENOL) tablet 650 mg  650 mg Oral Q6H PRN Rise Patience, MD       Or  . acetaminophen (TYLENOL) suppository 650 mg  650 mg Rectal Q6H PRN Rise Patience, MD      . carvedilol (COREG) tablet 3.125 mg  3.125 mg Oral BID WC Rise Patience, MD   3.125 mg at 07/21/11 0900  . enoxaparin (LOVENOX) injection 30 mg  30 mg Subcutaneous Q12H Nita Sells, MD   30 mg at 07/21/11 0901  . felodipine (PLENDIL) 24 hr tablet 2.5 mg  2.5 mg Oral BID Rise Patience, MD   2.5 mg at 07/21/11 1042  . ferrous sulfate tablet 325 mg  325 mg Oral TID PC Nita Sells, MD   325 mg at 07/21/11 1043  . hydrALAZINE (APRESOLINE) injection 10 mg  10 mg Intravenous Q4H PRN Rise Patience, MD      . HYDROcodone-acetaminophen [facility]) 5-325 MG per tablet 1-2 tablet  1-2 tablet Oral Q6H PRN Nita Sells, MD   1 tablet at 07/21/11 0901  . HYDROmorphone (DILAUDID) injection 0.5 mg  0.5 mg Intravenous Q4H PRN Rise Patience, MD   0.5 mg at 07/19/11 0511  . insulin aspart (novoLOG) injection 0-9 Units  0-9 Units Subcutaneous TID WC Rise Patience, MD   1 Units at 07/20/11 1734  . levofloxacin (LEVAQUIN) IVPB  750 mg  750 mg Intravenous Q24H Julious Langlois, MD   750 mg at 07/21/11 1406  . lisinopril (PRINIVIL,ZESTRIL) tablet 20 mg  20 mg Oral Daily Rise Patience, MD   20 mg at 07/21/11 1043  . menthol-cetylpyridinium (CEPACOL) lozenge 3 mg  1 lozenge Oral PRN Nita Sells, MD       Or  . phenol [facility]) mouth spray 1 spray  1 spray Mouth/Throat PRN Nita Sells, MD      . methocarbamol (ROBAXIN) tablet 500 mg  500 mg Oral Q8H PRN Nita Sells, MD   500 mg at 07/20/11 1056  . metoCLOPramide (REGLAN) tablet 5-10 mg  5-10 mg Oral Q8H PRN Nita Sells, MD       Or  . metoCLOPramide (REGLAN) injection 5-10 mg  5-10 mg Intravenous Q8H PRN Nita Sells, MD      . morphine 2 MG/ML injection 0.5 mg  0.5 mg Intravenous Q2H PRN Nita Sells, MD      . ondansetron [facility]) tablet 4 mg  4 mg Oral Q6H PRN Rise Patience, MD       Or  .  ondansetron (ZOFRAN) injection 4 mg  4 mg Intravenous Q6H PRN Rise Patience, MD   4 mg at 07/19/11 0848  . pantoprazole (PROTONIX) EC tablet 40 mg  40 mg Oral Q1200 Rise Patience, MD   40 mg at 07/21/11 1158  . potassium chloride 20 MEQ/15ML (10%) liquid 40 mEq  40 mEq Oral Once Sheika Coutts, MD   40 mEq at 07/21/11 1407  . simvastatin (ZOCOR) tablet 40 mg  40 mg Oral QPM Rise Patience, MD   40 mg at 07/20/11 1732   No Known Allergies Principal Problem:  *Closed left hip fracture Active Problems:  HTN (hypertension)  DM2 (diabetes mellitus, type 2)  Hyperlipidemia  Anemia associated with acute blood loss   Vital signs in last 24 hours: Temp:  [98 F (36.7 C)-100.7 F (38.2 C)] 98 F (36.7 C) (07/08 1300) Pulse Rate:  [78-102] 94  (07/08 1300) Resp:  [18-20] 18  (07/08 1300) BP: (125-170)/(65-82) 125/65 mmHg (07/08 1300) SpO2:  [93 %-98 %] 96 % (07/08 1300) Weight change:  Last BM Date: 07/17/11  Intake/Output from previous day: 07/07 0701 - 07/08 0700 In: 900  [P.O.:900] Out: 450 [Urine:450] Intake/Output this shift: Total I/O In: 180 [P.O.:180] Out: -   Lab Results:  Basename 07/21/11 0400 07/20/11 0452  WBC 11.3* 11.0*  HGB 10.6* 10.8*  HCT 30.3* 30.2*  PLT 122* 111*   BMET  Basename 07/21/11 0400 07/20/11 0452  NA 137 137  K 3.4* 3.7  CL 103 104  CO2 26 24  GLUCOSE 98 83  BUN 8 12  CREATININE 0.70 0.87  CALCIUM 8.5 8.4    Studies/Results: Dg Chest Port 1 View  07/21/2011  *RADIOLOGY REPORT*  Clinical Data: 78 year old female with fever and cough. History of head and neck cancer.  PORTABLE CHEST - 1 VIEW  Comparison: 07/18/2011 and earlier.  Findings: AP portable semi upright view at 1006 hours.  Stable left chest Port-A-Cath.  At stable contour  no pneumothorax, pulmonary edema, pleural effusion or confluent pulmonary opacity.  IMPRESSION: No acute cardiopulmonary abnormality.  Original Report Authenticated By: Randall An, M.D.    Medications: I have reviewed the patient's current medications.   Physical exam GENERAL- alert, warm to touch, sitting up in chair. HEAD- normal atraumatic, no neck masses, normal thyroid, no jvd RESPIRATORY- appears well, vitals normal, no respiratory distress, acyanotic, normal RR, ear and throat exam is normal, neck free of mass or lymphadenopathy, chest clear, no wheezing, crepitations, rhonchi, normal symmetric air entry CVS- regular rate and rhythm, S1, S2 normal, no murmur, click, rub or gallop ABDOMEN- abdomen is soft without significant tenderness, masses, organomegaly or guarding NEURO- Grossly normal EXTREMITIES- extremities normal, atraumatic, no cyanosis or edema  Plan   * Closed left hip fracture- s/p orif. Doing ok. Appreciate ortho. For snf, hopefully tomorrow, once defervesced.  * Acute on chronic blood loss anemia- responded to prbc transfusion appropriately.  * HTN (hypertension)- controlled on home meds.  * DM2 (diabetes mellitus, type 2)- controlled.  * Hyperlipidemia.   *uti/ leukocytosis- start levaquin. Follow urine culture.  * dvt prophylaxis.     Deboraha Goar 07/21/2011 2:31 PM Pager: [phone].

## 2011-07-21 NOTE — Progress Notes (Signed)
Physical Therapy Treatment Patient Details Name: [name] MRN: [id number] DOB: 07-Mar-1932 Today's Date: 07/21/2011 Time: [id number] PT Time Calculation (min): 18 min  PT Assessment / Plan / Recommendation Comments on Treatment Session  Pt doing better remembering hip precautions and moving safely but still fatigues very quickly and needs more assistance when she becomes fatigued.  PT will continue to follow.    Follow Up Recommendations  Skilled nursing facility;Supervision/Assistance - 24 hour    Barriers to Discharge        Equipment Recommendations  Defer to next venue    Recommendations for Other Services    Frequency Min 6X/week   Plan Discharge plan remains appropriate;Frequency remains appropriate    Precautions / Restrictions Precautions Precautions: Posterior Hip Precaution Booklet Issued: No Precaution Comments: pt able to recall 3/3 post hip precautions today and WB status Required Braces or Orthoses: Knee Immobilizer - Left Knee Immobilizer - Left: Other (comment) (in bed) Restrictions Weight Bearing Restrictions: Yes LLE Weight Bearing: Touchdown weight bearing   Pertinent Vitals/Pain 8/10 pain left hip, premedicated    Mobility  Bed Mobility Bed Mobility: Not assessed Transfers Transfers: Sit to Stand;Stand to Sit Sit to Stand: 4: Min guard;4: Min assist;From toilet;From chair/3-in-1;With upper extremity assist Stand to Sit: 4: Min guard;4: Min assist;To chair/3-in-1;To toilet;With upper extremity assist Details for Transfer Assistance: min-guard A given to and from chair, min A given to and from toilet with cues to attend to hip flexion precaution with toileting. Ambulation/Gait Ambulation/Gait Assistance: 4: Min guard;4: Min Wellsite geologist (Feet): 25 Feet Assistive device: Rolling walker Ambulation/Gait Assistance Details: min-guard A given first half of walk, pt moving well with good sequencing.  Min A second half of ambulation, after  toileting, as pt began to fatigue considerably and reported that she felt tired and slightly dizzy. Gait Pattern: Step-to pattern;Decreased stance time - left;Decreased step length - left;Decreased step length - right Gait velocity: decreased Stairs: No Wheelchair Mobility Wheelchair Mobility: No    Exercises Total Joint Exercises Ankle Circles/Pumps: AROM;Both;10 reps;Seated Quad Sets: AROM;Left;10 reps;Seated Hip ABduction/ADduction: AAROM;Left;10 reps;Seated Long Arc Quad: AROM;Strengthening;Left;10 reps;Seated   PT Diagnosis:    PT Problem List:   PT Treatment Interventions:     PT Goals Acute Rehab PT Goals PT Goal Formulation: With patient Time For Goal Achievement: 08/02/11 Potential to Achieve Goals: Good Pt will go Supine/Side to Sit: with supervision PT Goal: Supine/Side to Sit - Progress: Progressing toward goal Pt will go Sit to Supine/Side: with supervision PT Goal: Sit to Supine/Side - Progress: Progressing toward goal Pt will go Sit to Stand: with supervision PT Goal: Sit to Stand - Progress: Progressing toward goal Pt will go Stand to Sit: with supervision PT Goal: Stand to Sit - Progress: Progressing toward goal Pt will Ambulate: 51 - 150 feet;with supervision;with least restrictive assistive device PT Goal: Ambulate - Progress: Progressing toward goal Pt will Perform Home Exercise Program: with supervision, verbal cues required/provided PT Goal: Perform Home Exercise Program - Progress: Progressing toward goal  Visit Information  Last PT Received On: 07/21/11 Assistance Needed: +1    Subjective Data  Subjective: I need to go to the bathroom Patient Stated Goal: Get well.   Cognition  Overall Cognitive Status: Appears within functional limits for tasks assessed/performed Arousal/Alertness: Awake/alert Orientation Level: Appears intact for tasks assessed Behavior During Session: [facility] for tasks performed Safety/Judgement - Other Comments: Pt did much  better today with safe mvmt and judgement    Balance  Balance Balance Assessed:  Yes Dynamic Standing Balance Dynamic Standing - Balance Support: During functional activity;No upper extremity supported Dynamic Standing - Level of Assistance: 4: Min assist  End of Session PT - End of Session Equipment Utilized During Treatment: Gait belt Activity Tolerance: Patient limited by fatigue Patient left: in chair;with call bell/phone within reach Nurse Communication: Mobility status   GP   Leighton Roach, PT  Acute Rehab Services  [place], [place] 07/21/2011, 3:04 PM

## 2011-07-21 NOTE — Progress Notes (Signed)
Utilization review completed. Barrie Sigmund, RN, BSN. 

## 2011-07-21 NOTE — Progress Notes (Signed)
Awaiting stability per MD.  Bed offers in place and patient accepted bed at [facility]. Will assist with DC once MD has scheduled d/c.   Lorie Phenix. [place], [place]

## 2011-07-21 NOTE — Progress Notes (Signed)
OT Cancellation Note  Pt declining OT session at this time due to arrival of meal tray as well as visitor.  Will re-attempt as time allows.  07/21/2011 Darrol Jump OTR/L Pager [phone] Office [phone]

## 2011-07-21 NOTE — Progress Notes (Signed)
PATIENT ID: [name]   3 Days Post-Op Procedure(s) (LRB): ARTHROPLASTY BIPOLAR HIP (Left)  Subjective: No new c/o.  Up with PT.  Objective:  Filed Vitals:   07/21/11 0657  BP:   Pulse:   Temp: 99.8 F (37.7 C)  Resp:      L hip dressing changed.  Inc c/d/i No calf TTP or swelling NVID  Labs:   [facility] 07/21/11 0400 07/20/11 0452 07/19/11 1150 07/19/11 0728  HGB 10.6* 10.8* 8.1* 7.6*   Basename 07/21/11 0400 07/20/11 0452  WBC 11.3* 11.0*  RBC 3.56* 3.55*  HCT 30.3* 30.2*  PLT 122* 111*   Basename 07/21/11 0400 07/20/11 0452  NA 137 137  K 3.4* 3.7  CL 103 104  CO2 26 24  BUN 8 12  CREATININE 0.70 0.87  GLUCOSE 98 83  CALCIUM 8.5 8.4    Assessment and Plan:Doing well SNF when medically ready TDWB LLE  VTE proph: Lovenox then ECASA BID after d/c for 2 wks F/u with me in 2 wks

## 2011-07-22 ENCOUNTER — Encounter (HOSPITAL_COMMUNITY): Payer: Self-pay | Admitting: Orthopedic Surgery

## 2011-07-22 DIAGNOSIS — R7881 Bacteremia: Secondary | ICD-10-CM | POA: Diagnosis present

## 2011-07-22 LAB — COMPREHENSIVE METABOLIC PANEL
Albumin: 2.1 g/dL — ABNORMAL LOW (ref 3.5–5.2)
Alkaline Phosphatase: 40 U/L (ref 39–117)
BUN: 8 mg/dL (ref 6–23)
Calcium: 8.5 mg/dL (ref 8.4–10.5)
Potassium: 3.5 mEq/L (ref 3.5–5.1)
Sodium: 139 mEq/L (ref 135–145)
Total Protein: 5.4 g/dL — ABNORMAL LOW (ref 6.0–8.3)

## 2011-07-22 LAB — CBC
HCT: 27.7 % — ABNORMAL LOW (ref 36.0–46.0)
MCHC: 34.3 g/dL (ref 30.0–36.0)
RDW: 15.8 % — ABNORMAL HIGH (ref 11.5–15.5)

## 2011-07-22 LAB — GLUCOSE, CAPILLARY
Glucose-Capillary: 96 mg/dL (ref 70–99)
Glucose-Capillary: 73 mg/dL (ref 70–99)
Glucose-Capillary: 109 mg/dL — ABNORMAL HIGH (ref 70–99)

## 2011-07-22 MED ORDER — LEVOFLOXACIN 500 MG PO TABS: 500.0000 mg | ORAL_TABLET | Freq: Every day | ORAL | Status: AC

## 2011-07-22 MED ORDER — SODIUM CHLORIDE 0.9 % IJ SOLN
10.0000 mL | INTRAMUSCULAR | Status: DC | PRN
  Administered 2011-07-22: 10 mL

## 2011-07-22 MED ORDER — FERROUS SULFATE 325 (65 FE) MG PO TABS: 325.0000 mg | ORAL_TABLET | Freq: Every day | ORAL | Status: DC

## 2011-07-22 NOTE — Progress Notes (Signed)
OT Cancellation Note  Treatment cancelled today due to medical issues with patient which prohibited therapy. Pt just had portacath removed. Will return later if time allows.  [place], OTR/L  [phone] 07/22/2011 07/22/2011, 3:25 PM

## 2011-07-22 NOTE — Progress Notes (Signed)
CARE MANAGEMENT NOTE 07/22/2011  Patient:  [name], [name]   Account Number:  [id number]  Date Initiated:  07/22/2011  Documentation initiated by:  Ricki Miller  Subjective/Objective Assessment:   78 yr old female s/p ORIF of left hip     Action/Plan:   Patient eill be gong to [facility] for rehab. Social Worker is aware.   Anticipated DC Date:  07/22/2011   Anticipated DC Plan:  SKILLED NURSING FACILITY  In-house referral  Clinical Social Worker      DC Planning Services  CM consult      Choice offered to / List presented to:             Status of service:  Completed, signed off Medicare Important Message given?   (If response is "NO", the following Medicare IM given date fields will be blank) Date Medicare IM given:   Date Additional Medicare IM given:    Discharge Disposition:  [place]

## 2011-07-22 NOTE — Discharge Summary (Signed)
DISCHARGE SUMMARY  [name]  MR#: [id number]  DOB:1932/09/01  Date of Admission: 07/17/2011 Date of Discharge: 07/22/2011  Attending Physician:Larina Lieurance  Patient's [id number] VINCENT, MD  Consults:Treatment Team:  Nita Sells, MD  Discharge Diagnoses: Present on Admission:  .Closed left hip fracture .HTN (hypertension) .DM2 (diabetes mellitus, type 2) .Hyperlipidemia .UTI (lower urinary tract infection) .Bacteremia  Hospital Course: [name] was admitted after a fall which resulted in a left trochanteric fracture. She was also found to have uti. One blood culture bottle growing gram positive cocci, which i suspect is a skin contaminant, likely coagulase negative staph. She is doing well on levaquin, and should complete 5 more days. She will need follow up of the blood cultures for Id and sensitivity, to adjust abx at snf. [name] had hemiarthroplasty by Dr Tamera Punt, and is doing well fracture wise. She will d/c to rehab in stable condition today.   Medication List  As of 07/22/2011  1:34 PM   TAKE these medications         aspirin 81 MG tablet   Take 81 mg by mouth daily.      carvedilol 3.125 MG tablet   Commonly known as: COREG   Take 3.125 mg by mouth 2 (two) times daily with a meal.      felodipine 2.5 MG 24 hr tablet   Commonly known as: PLENDIL   Take 2.5 mg by mouth 2 (two) times daily.      ferrous sulfate 325 (65 FE) MG tablet   Take 1 tablet (325 mg total) by mouth daily with breakfast.      furosemide 20 MG tablet   Commonly known as: LASIX   Take 20 mg by mouth 2 (two) times daily.      HYDROcodone-acetaminophen 5-325 MG per tablet   Commonly known as: NORCO   Take 1-2 tablets by mouth every 4 (four) hours as needed for pain.      levofloxacin 500 MG tablet   Commonly known as: LEVAQUIN   Take 1 tablet (500 mg total) by mouth daily.      lisinopril 20 MG tablet   Commonly known as: PRINIVIL,ZESTRIL   Take 20 mg by  mouth daily.      methocarbamol 500 MG tablet   Commonly known as: ROBAXIN   Take 1 tablet (500 mg total) by mouth every 8 (eight) hours as needed.      multivitamin tablet   Take 1 tablet by mouth daily.      omeprazole 20 MG capsule   Commonly known as: PRILOSEC   Take 20 mg by mouth 2 (two) times daily.      simvastatin 40 MG tablet   Commonly known as: ZOCOR   Take 40 mg by mouth every evening.             Day of Discharge BP 151/70  Pulse 77  Temp 98.7 F (37.1 C) (Oral)  Resp 20  SpO2 96%  Physical Exam: Comfortable in recliner chair.  Results for orders placed during the hospital encounter of 07/17/11 (from the past 24 hour(s))  GLUCOSE, CAPILLARY     Status: Abnormal   Collection Time   07/21/11  4:44 PM      Component Value Range   Glucose-Capillary 122 (*) 70 - 99 mg/dL  GLUCOSE, CAPILLARY     Status: Normal   Collection Time   07/21/11  9:43 PM      Component Value Range   Glucose-Capillary 96  70 - 99 mg/dL  CBC     Status: Abnormal   Collection Time   07/22/11  5:35 AM      Component Value Range   WBC 8.9  4.0 - 10.5 K/uL   RBC 3.21 (*) 3.87 - 5.11 MIL/uL   Hemoglobin 9.5 (*) 12.0 - 15.0 g/dL   HCT 27.7 (*) 36.0 - 46.0 %   MCV 86.3  78.0 - 100.0 fL   MCH 29.6  26.0 - 34.0 pg   MCHC 34.3  30.0 - 36.0 g/dL   RDW 15.8 (*) 11.5 - 15.5 %   Platelets 136 (*) 150 - 400 K/uL  COMPREHENSIVE METABOLIC PANEL     Status: Abnormal   Collection Time   07/22/11  5:35 AM      Component Value Range   Sodium 139  135 - 145 mEq/L   Potassium 3.5  3.5 - 5.1 mEq/L   Chloride 105  96 - 112 mEq/L   CO2 27  19 - 32 mEq/L   Glucose, Bld 99  70 - 99 mg/dL   BUN 8  6 - 23 mg/dL   Creatinine, Ser 0.72  0.50 - 1.10 mg/dL   Calcium 8.5  8.4 - 10.5 mg/dL   Total Protein 5.4 (*) 6.0 - 8.3 g/dL   Albumin 2.1 (*) 3.5 - 5.2 g/dL   AST 33  0 - 37 U/L   ALT 11  0 - 35 U/L   Alkaline Phosphatase 40  39 - 117 U/L   Total Bilirubin 0.7  0.3 - 1.2 mg/dL   GFR calc non Af Amer  81 (*) >90 mL/min   GFR calc Af Amer >90  >90 mL/min  GLUCOSE, CAPILLARY     Status: Normal   Collection Time   07/22/11  7:00 AM      Component Value Range   Glucose-Capillary 73  70 - 99 mg/dL  GLUCOSE, CAPILLARY     Status: Abnormal   Collection Time   07/22/11 11:20 AM      Component Value Range   Glucose-Capillary 109 (*) 70 - 99 mg/dL   Comment 1 Notify RN     Comment 2 Documented in Chart      Disposition: to snf today.   Follow-up Appts: Discharge Orders    Future Appointments: Provider: Department: Dept Phone: Center:   04/02/2012 10:00 AM [facility] [phone] None   04/02/2012 10:30 AM Deatra Robinson, MD [facility] [phone] None     Future Orders Please Complete By Expires   Diet - low sodium heart healthy      Increase activity slowly         Follow-up Information    Follow up with Nita Sells, MD in 2 weeks.   Contact information:   [place], [address] [place] [phone]          Tests Needing Follow-up: Blood culture/cbc  Time spent in discharge (includes decision making & examination of pt): 20 minutes  Signed: Kiylah Loyer 07/22/2011, 1:34 PM

## 2011-07-22 NOTE — Progress Notes (Signed)
Called report to [facility].

## 2011-07-22 NOTE — Progress Notes (Signed)
Physical Therapy Treatment Patient Details Name: [name] MRN: [id number] DOB: Apr 08, 1932 Today's Date: 07/22/2011 Time: [id number] PT Time Calculation (min): 26 min  PT Assessment / Plan / Recommendation Comments on Treatment Session  Cont's to progress with mobility & activity tolerance.      Follow Up Recommendations  Skilled nursing facility;Supervision/Assistance - 24 hour    Barriers to Discharge        Equipment Recommendations  Defer to next venue    Recommendations for Other Services    Frequency Min 6X/week   Plan Discharge plan remains appropriate;Frequency remains appropriate    Precautions / Restrictions Precautions Precautions: Posterior Hip Precaution Comments: Required min cueing/prompting to recall 3/3 hip precautions.   Required Braces or Orthoses: Knee Immobilizer - Left Knee Immobilizer - Left: Other (comment) (in bed) Restrictions Weight Bearing Restrictions: Yes LLE Weight Bearing: Touchdown weight bearing     Pertinent Vitals/Pain 7/10  Hip.  Repositioned.  RN notified.      Mobility  Bed Mobility Bed Mobility: Supine to Sit Supine to Sit: 4: Min assist;With rails;HOB flat Details for Bed Mobility Assistance: Cues to reinforce hip precautions & technique.  (A) to prevent LLE from rotating inwards.     Transfers Transfers: Sit to Stand;Stand to Sit Sit to Stand: 4: Min guard;With upper extremity assist;With armrests;From bed;From chair/3-in-1 Stand to Sit: 4: Min guard;With upper extremity assist;With armrests;To chair/3-in-1 Details for Transfer Assistance: Cues for hand placement & LLE positioning to ensure adherence to hip precautions & weight bearing restriction.   Ambulation/Gait Ambulation/Gait Assistance: 4: Min guard Ambulation Distance (Feet): 40 Feet Assistive device: Rolling walker Ambulation/Gait Assistance Details: Cues for use of UE's to off-load from LLE to maintain TWBing.   Gait Pattern: Step-to pattern;Decreased stance  time - left;Decreased hip/knee flexion - left Stairs: No Wheelchair Mobility Wheelchair Mobility: No    Exercises Total Joint Exercises Ankle Circles/Pumps: AROM;Both;15 reps Gluteal Sets: AROM;Both;15 reps Heel Slides: AAROM;Left;Other reps (comment) (12 reps) Hip ABduction/ADduction: AAROM;Left;Other reps (comment) (12 reps) Long Arc Quad: AROM;Left;Other reps (comment) (12 reps)    PT Goals Acute Rehab PT Goals Time For Goal Achievement: 08/02/11 PT Goal: Supine/Side to Sit - Progress: Progressing toward goal PT Goal: Sit to Stand - Progress: Progressing toward goal PT Goal: Stand to Sit - Progress: Progressing toward goal PT Goal: Ambulate - Progress: Progressing toward goal PT Goal: Perform Home Exercise Program - Progress: Progressing toward goal  Visit Information  Last PT Received On: 07/22/11 Assistance Needed: +1    Subjective Data  Subjective: "Can I use the bed pan before I get up?"   Cognition  Overall Cognitive Status: Appears within functional limits for tasks assessed/performed Arousal/Alertness: Awake/alert Orientation Level: Appears intact for tasks assessed Behavior During Session: [facility] for tasks performed    Balance  Balance Balance Assessed: No  End of Session PT - End of Session Equipment Utilized During Treatment: Gait belt Activity Tolerance: Patient tolerated treatment well Patient left: in chair;with call bell/phone within reach Nurse Communication: Mobility status;Other (comment) (request for pain medication)    Sarajane Marek, [place] [phone] 07/22/2011

## 2011-07-22 NOTE — Progress Notes (Signed)
PATIENT ID: [name]   4 Days Post-Op Procedure(s) (LRB): ARTHROPLASTY BIPOLAR HIP (Left)  Subjective: Doing well. No new complaints. Progressing with physical therapy.  Objective:  Filed Vitals:   07/22/11 0607  BP: 122/62  Pulse: 80  Temp: 98.7 F (37.1 C)  Resp: 20     Dressing clean dry and intact left hip. Mild swelling. Distally neurovascularly intact.  Labs:   [facility] 07/22/11 0535 07/21/11 0400 07/20/11 0452 07/19/11 1150 07/19/11 0728  HGB 9.5* 10.6* 10.8* 8.1* 7.6*   Basename 07/22/11 0535 07/21/11 0400  WBC 8.9 11.3*  RBC 3.21* 3.56*  HCT 27.7* 30.3*  PLT 136* 122*   Basename 07/22/11 0535 07/21/11 0400  NA 139 137  K 3.5 3.4*  CL 105 103  CO2 27 26  BUN 8 8  CREATININE 0.72 0.70  GLUCOSE 99 98  CALCIUM 8.5 8.5    Assessment and Plan: Doing well status post left hip hemiarthroplasty and repair of greater trochanter fracture Continued PT, transitioned to SNF when ready  VTE proph: Lovenox, transitioned to enteric-coated aspirin twice daily after discharge.

## 2011-07-22 NOTE — Progress Notes (Signed)
CRITICAL VALUE ALERT  Critical value received: + Blood culture, gram + cocci in pairs  Date of notification:  07/22/11   Time of notification:  0501  Critical value read back:yes  Nurse who received alert:  Sharyn Creamer, RN  MD notified (1st page):  Tylene Fantasia, NP  Time of first page:  [phone]  MD notified (2nd page): Tylene Fantasia, NP  Time of second page: [phone]  Responding MD: Paged A. Lissa Merlin, NP at [phone]  Time MD responded: A. Lissa Merlin, NP responded at [phone]

## 2011-07-24 LAB — CULTURE, BLOOD (ROUTINE X 2)

## 2011-07-27 LAB — CULTURE, BLOOD (ROUTINE X 2): Culture: NO GROWTH

## 2011-09-10 ENCOUNTER — Other Ambulatory Visit (HOSPITAL_COMMUNITY): Payer: Self-pay | Admitting: Family Medicine

## 2011-09-10 ENCOUNTER — Ambulatory Visit (HOSPITAL_COMMUNITY)
Admission: RE | Admit: 2011-09-10 | Discharge: 2011-09-10 | Disposition: A | Discharge status: Home / Self Care | Payer: PRIVATE HEALTH INSURANCE | Source: Ambulatory Visit | Attending: Family Medicine | Admitting: Family Medicine

## 2011-09-10 DIAGNOSIS — M8448XA Pathological fracture, other site, initial encounter for fracture: Secondary | ICD-10-CM | POA: Insufficient documentation

## 2011-09-10 DIAGNOSIS — R52 Pain, unspecified: Secondary | ICD-10-CM

## 2011-10-20 ENCOUNTER — Other Ambulatory Visit: Payer: Self-pay | Admitting: Orthopedic Surgery

## 2012-04-02 ENCOUNTER — Other Ambulatory Visit: Payer: PRIVATE HEALTH INSURANCE | Admitting: Lab

## 2012-04-02 ENCOUNTER — Telehealth: Payer: Self-pay | Admitting: *Deleted

## 2012-04-02 ENCOUNTER — Ambulatory Visit: Payer: PRIVATE HEALTH INSURANCE | Admitting: Oncology

## 2012-04-02 NOTE — Telephone Encounter (Signed)
Called pt to cancel appt d/t Dr. Humphrey Rolls with illness.  Informed pt that we will call back with new appt date and time.

## 2012-04-19 ENCOUNTER — Telehealth: Payer: Self-pay | Admitting: Oncology

## 2012-04-19 NOTE — Telephone Encounter (Signed)
Pt called today to r/s 3/21 appt that was cx'd when KK was sick. Message to [place] for new d/t. Pt aware.

## 2012-04-20 ENCOUNTER — Telehealth: Payer: Self-pay | Admitting: Oncology

## 2012-04-20 NOTE — Telephone Encounter (Signed)
Per staff message from [place] schedule pt w/one of the APP's this month. S/w pt re appt for 4/21.

## 2012-05-03 ENCOUNTER — Other Ambulatory Visit: Payer: Medicaid Other | Admitting: Lab

## 2012-05-03 ENCOUNTER — Ambulatory Visit: Payer: Medicaid Other | Admitting: Family

## 2012-11-29 ENCOUNTER — Encounter: Payer: Self-pay | Admitting: Gastroenterology

## 2012-12-15 ENCOUNTER — Ambulatory Visit: Payer: Medicaid Other | Admitting: Interventional Cardiology

## 2013-01-07 ENCOUNTER — Encounter: Payer: Self-pay | Admitting: Interventional Cardiology

## 2013-01-25 ENCOUNTER — Ambulatory Visit (AMBULATORY_SURGERY_CENTER): Payer: Self-pay | Admitting: *Deleted

## 2013-01-25 ENCOUNTER — Telehealth: Payer: Self-pay | Admitting: *Deleted

## 2013-01-25 VITALS — Ht 60.0 in | Wt 139.2 lb

## 2013-01-25 DIAGNOSIS — Z86010 Personal history of colonic polyps: Secondary | ICD-10-CM

## 2013-01-25 MED ORDER — NA SULFATE-K SULFATE-MG SULF 17.5-3.13-1.6 GM/177ML PO SOLN: 1.0000 | Freq: Once | ORAL | Status: DC

## 2013-01-25 NOTE — Progress Notes (Signed)
No allergies to eggs or soy. No problems with anesthesia.  

## 2013-01-25 NOTE — Telephone Encounter (Signed)
Pt scheduled for direct colonoscopy with Dr. Deatra Ina on Tuesday 1/27.  Last colonoscopy 9 years ago in [place], [place].  Pt said she had polyps.  Release of information form signed and given to Genella Mech.

## 2013-01-25 NOTE — Telephone Encounter (Signed)
Faxed release today 

## 2013-02-02 NOTE — Telephone Encounter (Signed)
I have faxed twice for records  Dr Deatra Ina, Patients procedure was 9 years ago in [place] and we have not received any info from them   Can will still continue without her notes

## 2013-02-08 ENCOUNTER — Ambulatory Visit (AMBULATORY_SURGERY_CENTER): Payer: PRIVATE HEALTH INSURANCE | Admitting: Gastroenterology

## 2013-02-08 ENCOUNTER — Encounter: Payer: Self-pay | Admitting: Gastroenterology

## 2013-02-08 VITALS — BP 187/87 | HR 71 | Temp 96.4°F | Resp 12 | Ht 60.0 in | Wt 139.0 lb

## 2013-02-08 DIAGNOSIS — Z1211 Encounter for screening for malignant neoplasm of colon: Secondary | ICD-10-CM

## 2013-02-08 DIAGNOSIS — K5730 Diverticulosis of large intestine without perforation or abscess without bleeding: Secondary | ICD-10-CM

## 2013-02-08 DIAGNOSIS — Z86010 Personal history of colonic polyps: Secondary | ICD-10-CM

## 2013-02-08 DIAGNOSIS — D126 Benign neoplasm of colon, unspecified: Secondary | ICD-10-CM

## 2013-02-08 LAB — GLUCOSE, CAPILLARY
GLUCOSE-CAPILLARY: 106 mg/dL — AB (ref 70–99)
GLUCOSE-CAPILLARY: 64 mg/dL — AB (ref 70–99)
GLUCOSE-CAPILLARY: 71 mg/dL (ref 70–99)
Glucose-Capillary: 87 mg/dL (ref 70–99)
Glucose-Capillary: 109 mg/dL — ABNORMAL HIGH (ref 70–99)

## 2013-02-08 MED ORDER — SODIUM CHLORIDE 0.9 % IV SOLN: 500.0000 mL | INTRAVENOUS | Status: DC

## 2013-02-08 NOTE — Progress Notes (Signed)
Patient's b/p elevated,left 234/120, right 214/121. Blood sugar 87, asymptomatic. Mont Dutton CRNA informed by Randye Lobo CRNA. Patient stating she gets very anxious when she comes to the MD.

## 2013-02-08 NOTE — Progress Notes (Signed)
Blood sugar 106, Dr. Deatra Ina in , will Cancel procedure for today, unable to obtain IV access.

## 2013-02-08 NOTE — Progress Notes (Signed)
BP checked manually after patient undressed, and reclined, 220/100 right arm. Patient seen by Riley Nearing CRNA and Dr. Deatra Ina. EKG rhythm sinus with PVC and occasionally trigemy.Denies symptoms of hypoglycemia or any chest pain or dyspnea. Denies dizziness.

## 2013-02-08 NOTE — Progress Notes (Signed)
Per Elberta Spaniel (director of [facility]), she would like one additional RN to attempt the IV. Malissa Hippo, RN to [facility] and placed IV in right wrist with 1 attempt. Dr. Deatra Ina made aware and will proceed with case. BP 177/88.

## 2013-02-08 NOTE — Op Note (Signed)
[facility]  [address] [place], [address]   COLONOSCOPY PROCEDURE REPORT  PATIENT: [name], [name]  MR#: [id number] BIRTHDATE: [birth year] , 80  yrs. old GENDER: Female ENDOSCOPIST: Inda Castle, MD REFERRED [id number] Blount, M.D. PROCEDURE DATE:  02/08/2013 PROCEDURE:   Colonoscopy with snare polypectomy and Colonoscopy with cold biopsy polypectomy First Screening Colonoscopy - Avg.  risk and is 50 yrs.  old or older - No.  Prior Negative Screening - Now for repeat screening. N/A  History of Adenoma - Now for follow-up colonoscopy & has been > or = to 3 yrs.  Yes hx of adenoma.  Has been 3 or more years since last colonoscopy.  Polyps Removed Today? Yes. ASA CLASS:   Class II INDICATIONS:Patient's personal history of colon polyps greater than 10 years ago MEDICATIONS: MAC sedation, administered by CRNA and Propofol (Diprivan) 120 mg IV  DESCRIPTION OF PROCEDURE:   After the risks benefits and alternatives of the procedure were thoroughly explained, informed consent was obtained.  A digital rectal exam revealed no abnormalities of the rectum.   The LB [id number] and LB [id number] Loaner [id number]  endoscope was introduced through the anus and advanced to the cecum, which was identified by both the appendix and ileocecal valve. No adverse events experienced.   The quality of the prep was Suprep good  The instrument was then slowly withdrawn as the colon was fully examined.      COLON FINDINGS: A sessile polyp measuring 2 mm in size was found in the ascending colon.  A polypectomy was performed with cold forceps.   A sessile polyp measuring 3 mm in size was found in the descending colon.  A polypectomy was performed.  The resection was complete and the polyp tissue was completely retrieved.   Mild diverticulosis was noted in the ascending colon and at the cecum. The colon was otherwise normal.  There was no diverticulosis, inflammation, polyps or  cancers unless previously stated. Retroflexed views revealed no abnormalities. The time to cecum=3 minutes 06 seconds.  Withdrawal time=8 minutes 57 seconds.  The scope was withdrawn and the procedure completed. COMPLICATIONS: There were no complications.  ENDOSCOPIC IMPRESSION: 1.   Sessile polyp measuring 2 mm in size was found in the ascending colon; polypectomy was performed with cold forceps 2.   Sessile polyp measuring 3 mm in size was found in the descending colon; polypectomy was performed 3.   Mild diverticulosis was noted in the ascending colon and at the cecum 4.   The colon was otherwise normal  RECOMMENDATIONS: Given your age, you will not need another colonoscopy for colon cancer screening or polyp surveillance.  These types of tests usually stop around the age 14.   eSigned:  Inda Castle, MD 02/08/2013 11:57 AM   cc:   PATIENT NAME:  [name], [name] MR#: [id number]

## 2013-02-08 NOTE — Progress Notes (Signed)
1243patient's blood sugar 71, patient asymptomatic. Juice and peanut butter given to patient.(patient without her dentures unable to chew  graham crackers)

## 2013-02-08 NOTE — Progress Notes (Signed)
Alerted Dr Deatra Ina at 1310 that patients BP was 221/101. Verbal order per Dr. Deatra Ina to give [facility] and administered by CRNA Mont Dutton. At 1330 BP was 187/87, Dr. Deatra Ina said okay to discharge patient to home.

## 2013-02-08 NOTE — Progress Notes (Signed)
PACU RN concerned with BP, Spoke with Dr. Deatra Ina that ordered, 10mg  labetalol iv. He will monitor and follow up with her discharge. [place] CRNA

## 2013-02-08 NOTE — Patient Instructions (Signed)
YOU HAD AN ENDOSCOPIC PROCEDURE TODAY AT THE [facility]: Refer to the procedure report that was given to you for any specific questions about what was found during the examination.  If the procedure report does not answer your questions, please call your gastroenterologist to clarify.  If you requested that your care partner not be given the details of your procedure findings, then the procedure report has been included in a sealed envelope for you to review at your convenience later.  YOU SHOULD EXPECT: Some feelings of bloating in the abdomen. Passage of more gas than usual.  Walking can help get rid of the air that was put into your GI tract during the procedure and reduce the bloating. If you had a lower endoscopy (such as a colonoscopy or flexible sigmoidoscopy) you may notice spotting of blood in your stool or on the toilet paper. If you underwent a bowel prep for your procedure, then you may not have a normal bowel movement for a few days.  DIET: Your first meal following the procedure should be a light meal and then it is ok to progress to your normal diet.  A half-sandwich or bowl of soup is an example of a good first meal.  Heavy or fried foods are harder to digest and may make you feel nauseous or bloated.  Likewise meals heavy in dairy and vegetables can cause extra gas to form and this can also increase the bloating.  Drink plenty of fluids but you should avoid alcoholic beverages for 24 hours.  ACTIVITY: Your care partner should take you home directly after the procedure.  You should plan to take it easy, moving slowly for the rest of the day.  You can resume normal activity the day after the procedure however you should NOT DRIVE or use heavy machinery for 24 hours (because of the sedation medicines used during the test).    SYMPTOMS TO REPORT IMMEDIATELY: A gastroenterologist can be reached at any hour.  During normal business hours, 8:30 AM to 5:00 PM Monday through Friday,  call [phone].  After hours and on weekends, please call the GI answering service at [phone] who will take a message and have the physician on call contact you.   Following lower endoscopy (colonoscopy or flexible sigmoidoscopy):  Excessive amounts of blood in the stool  Significant tenderness or worsening of abdominal pains  Swelling of the abdomen that is new, acute  Fever of 100F or higher    FOLLOW UP: If any biopsies were taken you will be contacted by phone or by letter within the next 1-3 weeks.  Call your gastroenterologist if you have not heard about the biopsies in 3 weeks.  Our staff will call the home number listed on your records the next business day following your procedure to check on you and address any questions or concerns that you may have at that time regarding the information given to you following your procedure. This is a courtesy call and so if there is no answer at the home number and we have not heard from you through the emergency physician on call, we will assume that you have returned to your regular daily activities without incident.  SIGNATURES/CONFIDENTIALITY: You and/or your care partner have signed paperwork which will be entered into your electronic medical record.  These signatures attest to the fact that that the information above on your After Visit Summary has been reviewed and is understood.  Full responsibility of the confidentiality   of this discharge information lies with you and/or your care-partner.     

## 2013-02-08 NOTE — Progress Notes (Signed)
Called to room to assist during endoscopic procedure.  Patient ID and intended procedure confirmed with present staff. Received instructions for my participation in the procedure from the performing physician.  

## 2013-02-08 NOTE — Progress Notes (Signed)
Report to pacu rn, vss, bbs=clear 

## 2013-02-09 ENCOUNTER — Telehealth: Payer: Self-pay | Admitting: *Deleted

## 2013-02-09 NOTE — Telephone Encounter (Signed)
  Follow up Call-  Call back number 02/08/2013  Post procedure Call Back phone  # [phone]  Permission to leave phone message Yes     Patient questions:  Do you have a fever, pain , or abdominal swelling? no Pain Score  0 *  Have you tolerated food without any problems? yes  Have you been able to return to your normal activities? yes  Do you have any questions about your discharge instructions: Diet   no Medications  no Follow up visit  no  Do you have questions or concerns about your Care? no  Actions: * If pain score is 4 or above: No action needed, pain <4.

## 2013-02-15 ENCOUNTER — Encounter: Payer: Self-pay | Admitting: Gastroenterology

## 2013-03-15 ENCOUNTER — Encounter: Payer: Self-pay | Admitting: Interventional Cardiology

## 2013-03-15 ENCOUNTER — Ambulatory Visit (INDEPENDENT_AMBULATORY_CARE_PROVIDER_SITE_OTHER): Payer: PRIVATE HEALTH INSURANCE | Admitting: Interventional Cardiology

## 2013-03-15 VITALS — BP 152/82 | HR 92 | Ht 60.0 in | Wt 135.8 lb

## 2013-03-15 DIAGNOSIS — I10 Essential (primary) hypertension: Secondary | ICD-10-CM

## 2013-03-15 DIAGNOSIS — E785 Hyperlipidemia, unspecified: Secondary | ICD-10-CM

## 2013-03-15 DIAGNOSIS — I5032 Chronic diastolic (congestive) heart failure: Secondary | ICD-10-CM

## 2013-03-15 MED ORDER — CARVEDILOL 3.125 MG PO TABS: 3.1250 mg | ORAL_TABLET | Freq: Two times a day (BID) | ORAL | Status: DC

## 2013-03-15 NOTE — Patient Instructions (Signed)
Your physician has recommended you make the following change in your medication:  1) Take Carvedilol 3.125mg  twice daily. An Rx has been sent to your pharmacy  Take all other medication as prescribed  Your physician recommends that you schedule a follow-up appointment in: 6 weeks with our PA, or NP  Your physician wants you to follow-up in: 1 year with Dr.Smith You will receive a reminder letter in the mail two months in advance. If you don't receive a letter, please call our office to schedule the follow-up appointment.

## 2013-03-15 NOTE — Progress Notes (Signed)
Patient ID: [name], female   DOB: [birth year], 80 y.o.   MRN: [id number]    [address], [place], [place]  [address] Phone: [phone] Fax:  [phone]  Date:  03/15/2013   ID:  [name], DOB 1932-11-13, MRN [id number]  PCP:  Elizabeth Palau, MD   ASSESSMENT:  1. Hypertension, borderline control. This may stem from the patient taking her medications inappropriately. She is only taking carvedilol once a day 2. Asymptomatic diastolic heart failure, chronic  PLAN:  1. Increase carvedilol to 3.125 mg twice daily 2. Clinical followup with me in one year to 3. If increasing shortness of breath or other clinical problems she should call 4. Followup with practitioner in 6 weeks to ensure that blood pressures controlled. If it is not the carvedilol should be increased to 6.25 mg twice a day.   SUBJECTIVE: [name] is a 80 y.o. female who has history of diastolic heart failure. She denies orthopnea, PND, lower extremity swelling, palpitations, and syncope. She states that her drugs were decreased all of her medications to once a day.   Wt Readings from Last 3 Encounters:  03/15/13 135 lb 12.8 oz (61.598 kg)  02/08/13 139 lb (63.05 kg)  01/25/13 139 lb 3.2 oz (63.141 kg)     Past Medical History  Diagnosis Date  . Anemia 04/03/2011  . Hypertension   . High cholesterol   . Type II diabetes mellitus     not taking any medication  . Anginal pain   . Exertional dyspnea   . GERD (gastroesophageal reflux disease)   . 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 Depression   . 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 Blind left eye 1999  . Hip fracture 07/17/11    fall from 1-2 feet; left    Current Outpatient Prescriptions  Medication Sig Dispense Refill  . ALPRAZolam (XANAX) 0.5 MG tablet Take 0.5 mg by mouth daily.      Marland Kitchen aspirin 81 MG tablet Take 81 mg by mouth daily.      . carvedilol (COREG) 3.125 MG tablet Take 3.125  mg by mouth daily.       . felodipine (PLENDIL) 2.5 MG 24 hr tablet Take 2.5 mg by mouth 2 (two) times daily.      . furosemide (LASIX) 20 MG tablet Take 20 mg by mouth daily.       Marland Kitchen lisinopril (PRINIVIL,ZESTRIL) 20 MG tablet Take 20 mg by mouth daily.      . Multiple Vitamin (MULTIVITAMIN) tablet Take 1 tablet by mouth daily.      Marland Kitchen omeprazole (PRILOSEC) 20 MG capsule Take 20 mg by mouth daily.       . simvastatin (ZOCOR) 40 MG tablet Take 40 mg by mouth every evening.      . TRAVATAN Z 0.004 % SOLN ophthalmic solution Place 1 drop into the right eye at bedtime.       . ferrous sulfate 325 (65 FE) MG tablet Take 1 tablet (325 mg total) by mouth daily with breakfast.  30 tablet  0   No current facility-administered medications for this visit.    Allergies:   No Known Allergies  Social History:  The patient  reports that she quit smoking about 15 years ago. Her smoking use included Cigarettes. She has a 70.5 pack-year smoking history. She has never used smokeless tobacco. She reports that she drinks about 10.2 ounces  of alcohol per week. She reports that she uses illicit drugs (Marijuana and Heroin).   ROS:  Please see the history of present illness.      All other systems reviewed and negative.   OBJECTIVE: VS:  BP 152/82  Pulse 92  Ht 5' (1.524 m)  Wt 135 lb 12.8 oz (61.598 kg)  BMI 26.52 kg/m2 Well nourished, well developed, in no acute distress, appears her stated age 48: normal Neck: JVD flat. Carotid bruit absent  Cardiac:  normal S1, S2; RRR; no murmur. An S4 gallop is audible. Lungs:  clear to auscultation bilaterally, no wheezing, rhonchi or rales Abd: soft, nontender, no hepatomegaly Ext: Edema absent. Pulses 2+ and symmetric Skin: warm and dry Neuro:  CNs 2-12 intact, no focal abnormalities noted  EKG:  Normal sinus rhythm, prominent voltage, nonspecific ST-T wave abnormality   including inferior T-wave inversion    Signed, Illene Labrador III, MD 03/15/2013  2:15 PM  Medical History: Glottic cancer, Hypertension, Diabetes, Recurrent syncope and orthostasis, 05/2008--Dr Gamble echocardiogram with EF [id number], mild diastolic LV dysfunction, [id number] echocardiogram EF 55-60%, 2008 Dr Karsten Fells stress test without ischemia, diaphragmatic attenuation.

## 2013-04-21 ENCOUNTER — Other Ambulatory Visit: Payer: Self-pay | Admitting: Interventional Cardiology

## 2013-04-26 ENCOUNTER — Encounter: Payer: Self-pay | Admitting: Nurse Practitioner

## 2013-04-26 ENCOUNTER — Ambulatory Visit (INDEPENDENT_AMBULATORY_CARE_PROVIDER_SITE_OTHER): Payer: PRIVATE HEALTH INSURANCE | Admitting: Nurse Practitioner

## 2013-04-26 ENCOUNTER — Encounter (INDEPENDENT_AMBULATORY_CARE_PROVIDER_SITE_OTHER): Payer: Self-pay

## 2013-04-26 VITALS — BP 160/80 | HR 64 | Ht 60.0 in | Wt 135.8 lb

## 2013-04-26 DIAGNOSIS — I10 Essential (primary) hypertension: Secondary | ICD-10-CM

## 2013-04-26 LAB — BASIC METABOLIC PANEL
BUN: 16 mg/dL (ref 6–23)
CO2: 21 mEq/L (ref 19–32)
Calcium: 9.2 mg/dL (ref 8.4–10.5)
Chloride: 104 mEq/L (ref 96–112)
Creatinine, Ser: 1 mg/dL (ref 0.4–1.2)
GFR: 71.24 mL/min (ref >60.00)
Glucose, Bld: 86 mg/dL (ref 70–99)
Potassium: 4.2 mEq/L (ref 3.5–5.1)
Sodium: 139 mEq/L (ref 135–145)

## 2013-04-26 MED ORDER — FUROSEMIDE 20 MG PO TABS: ORAL_TABLET | ORAL | Status: DC

## 2013-04-26 NOTE — Progress Notes (Signed)
[name] Date of Birth: 1932-04-19 Medical Record [id number]  History of Present Illness: [name] is seen back today for a 6 week check. Seen for Dr. Tamala Julian. Originally from the [place]. She is a 80 year old female with HTN and chronic asymptomatic diastolic HF. Other issues include CKD - stage 2 and diabetes.   Seen here early March. Taking medicines wrong. BP was up. Has been seen by Dr. Florene Glen since her visit with Dr. Tamala Julian - he increased her to Lisinopril 20 mg BID.   Comes back today. Here alone. Doing ok. Some fatigue. Short of breath only if she over exerts but still tries to stay active. No chest pain. Not swelling. Tries to minimize her salt. Does not check her BP at home.   Current Outpatient Prescriptions  Medication Sig Dispense Refill  . ALPRAZolam (XANAX) 0.5 MG tablet Take 0.5 mg by mouth daily.      Marland Kitchen aspirin 81 MG tablet Take 81 mg by mouth daily.      . calcium-vitamin D 250-100 MG-UNIT per tablet Take 1 tablet by mouth daily.      . carvedilol (COREG) 3.125 MG tablet Take 1 tablet (3.125 mg total) by mouth 2 (two) times daily with a meal.  60 tablet  11  . felodipine (PLENDIL) 2.5 MG 24 hr tablet Take 2.5 mg by mouth 2 (two) times daily.      . furosemide (LASIX) 20 MG tablet take 1 tablet by mouth once daily  30 tablet  10  . lisinopril (PRINIVIL,ZESTRIL) 20 MG tablet Take 20 mg by mouth 2 (two) times daily.       Marland Kitchen omeprazole (PRILOSEC) 20 MG capsule Take 20 mg by mouth daily.       Marland Kitchen pyridOXINE (VITAMIN B-6) 100 MG tablet Take 100 mg by mouth daily.      . simvastatin (ZOCOR) 40 MG tablet Take 40 mg by mouth every evening.      . TRAVATAN Z 0.004 % SOLN ophthalmic solution Place 1 drop into the right eye at bedtime.       . ferrous sulfate 325 (65 FE) MG tablet Take 1 tablet (325 mg total) by mouth daily with breakfast.  30 tablet  0   No current facility-administered medications for this visit.    No Known Allergies  Past Medical History    Diagnosis Date  . Anemia 04/03/2011  . Hypertension   . High cholesterol   . Type II diabetes mellitus     not taking any medication  . Anginal pain   . Exertional dyspnea   . GERD (gastroesophageal reflux disease)   . 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 Depression   . 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 Blind left eye 1999  . Hip fracture 07/17/11    fall from 1-2 feet; left    Past Surgical History  Procedure Laterality Date  . Tubal ligation  1970's  . Tongue biopsy  1998    "cancer"  . Intraocular prostheses insertion  1999    left  . Hip arthroplasty  07/18/2011    Procedure: ARTHROPLASTY BIPOLAR HIP;  Surgeon: Nita Sells, MD;  Location: [facility];  Service: Orthopedics;  Laterality: Left;    History  Smoking status  . Former Smoker -- 1.50 packs/day for 47 years  . Types: Cigarettes  . Quit date: 09/13/1997  Smokeless tobacco  . Never Used  History  Alcohol Use  . 10.2 oz/week  . 81 Shots of liquor per week    Comment: 07/17/11 "1/5 rum q weekend"    Family History  Problem Relation Age of Onset  . Diabetes type II Mother   . Diabetes type II Father   . Diabetes type II Sister   . Colon cancer Neg Hx     Review of Systems: The review of systems is per the HPI.  All other systems were reviewed and are negative.  Physical Exam: BP 160/80  Pulse 64  Ht 5' (1.524 m)  Wt 135 lb 12.8 oz (61.598 kg)  BMI 26.52 kg/m2 BP by me is 132/70.  Patient is very pleasant and in no acute distress. Skin is warm and dry. Color is normal.  HEENT is unremarkable. Normocephalic/atraumatic. PERRL. Sclera are nonicteric. Neck is supple. No masses. No JVD. Lungs are clear. Cardiac exam shows a regular rate and rhythm. Abdomen is soft. Extremities are without edema. Gait and ROM are intact. No gross neurologic deficits noted.  LABORATORY DATA: BMET is pending  Lab Results  Component Value Date   WBC 8.9 07/22/2011   HGB 9.5* 07/22/2011    HCT 27.7* 07/22/2011   PLT 136* 07/22/2011   GLUCOSE 99 07/22/2011   CHOL 198 02/28/2011   TRIG 167* 02/28/2011   HDL 71 02/28/2011   LDLCALC 94 02/28/2011   ALT 11 07/22/2011   AST 33 07/22/2011   NA 139 07/22/2011   K 3.5 07/22/2011   CL 105 07/22/2011   CREATININE 0.72 07/22/2011   BUN 8 07/22/2011   CO2 27 07/22/2011   TSH 1.215 02/28/2011   INR 1.14 07/20/2011   HGBA1C  Value: 6.1 (NOTE)                                                                       According to the ADA Clinical Practice Recommendations for 2011, when HbA1c is used as a screening test:   >=6.5%   Diagnostic of Diabetes Mellitus           (if abnormal result  is confirmed)  5.7-6.4%   Increased risk of developing Diabetes Mellitus  References:Diagnosis and Classification of Diabetes Mellitus,Diabetes [id number] 1):S62-S69 and Standards of Medical Care in         Diabetes - 2011,Diabetes Care,2011,34  (Suppl 1):S11-S61.* 10/12/2009   MICROALBUR 2.63* 02/28/2011     Assessment / Plan: 1. HTN - BP has improved. Recheck BMET today. See back as planned  2. CKD - followed by Dr. Florene Glen - now on higher dose of her ACE - recheck BMET today.  3. Chronic diastolic HF - looks compensated.  See back as planned.   Patient is agreeable to this plan and will call if any problems develop in the interim.   Burtis Junes, RN, [facility] [address] [place], [place]  [id number] [phone]

## 2013-04-26 NOTE — Patient Instructions (Signed)
We will recheck lab today  See Dr. Tamala Julian back as planned  I will send a note to Dr. Florene Glen  Your blood pressure looks much better!!  Stay on your current medicines  Call the [place] office at [phone] if you have any questions, problems or concerns.

## 2014-01-25 ENCOUNTER — Other Ambulatory Visit: Payer: Self-pay | Admitting: Family Medicine

## 2014-01-25 ENCOUNTER — Ambulatory Visit
Admission: RE | Admit: 2014-01-25 | Discharge: 2014-01-25 | Disposition: A | Discharge status: Home / Self Care | Payer: PRIVATE HEALTH INSURANCE | Source: Ambulatory Visit | Attending: Family Medicine | Admitting: Family Medicine

## 2014-01-25 DIAGNOSIS — R0600 Dyspnea, unspecified: Secondary | ICD-10-CM

## 2014-02-07 ENCOUNTER — Encounter (HOSPITAL_COMMUNITY): Payer: Self-pay | Admitting: *Deleted

## 2014-02-07 ENCOUNTER — Emergency Department (HOSPITAL_COMMUNITY)
Admission: EM | Admit: 2014-02-07 | Discharge: 2014-02-08 | Disposition: A | Discharge status: Home / Self Care | Payer: Medicare Other | Attending: Emergency Medicine | Admitting: Emergency Medicine

## 2014-02-07 ENCOUNTER — Emergency Department (HOSPITAL_COMMUNITY): Payer: Medicare Other

## 2014-02-07 DIAGNOSIS — R079 Chest pain, unspecified: Secondary | ICD-10-CM | POA: Insufficient documentation

## 2014-02-07 DIAGNOSIS — K219 Gastro-esophageal reflux disease without esophagitis: Secondary | ICD-10-CM | POA: Diagnosis not present

## 2014-02-07 DIAGNOSIS — E119 Type 2 diabetes mellitus without complications: Secondary | ICD-10-CM | POA: Insufficient documentation

## 2014-02-07 DIAGNOSIS — Z85810 Personal history of malignant neoplasm of tongue: Secondary | ICD-10-CM | POA: Insufficient documentation

## 2014-02-07 DIAGNOSIS — Z87891 Personal history of nicotine dependence: Secondary | ICD-10-CM | POA: Diagnosis not present

## 2014-02-07 DIAGNOSIS — I10 Essential (primary) hypertension: Secondary | ICD-10-CM | POA: Diagnosis present

## 2014-02-07 DIAGNOSIS — D649 Anemia, unspecified: Secondary | ICD-10-CM | POA: Insufficient documentation

## 2014-02-07 DIAGNOSIS — Z8781 Personal history of (healed) traumatic fracture: Secondary | ICD-10-CM | POA: Diagnosis not present

## 2014-02-07 DIAGNOSIS — Z7982 Long term (current) use of aspirin: Secondary | ICD-10-CM | POA: Diagnosis not present

## 2014-02-07 DIAGNOSIS — Z79899 Other long term (current) drug therapy: Secondary | ICD-10-CM | POA: Diagnosis not present

## 2014-02-07 DIAGNOSIS — F329 Major depressive disorder, single episode, unspecified: Secondary | ICD-10-CM | POA: Insufficient documentation

## 2014-02-07 DIAGNOSIS — Z8669 Personal history of other diseases of the nervous system and sense organs: Secondary | ICD-10-CM | POA: Insufficient documentation

## 2014-02-07 LAB — URINALYSIS, ROUTINE W REFLEX MICROSCOPIC
BILIRUBIN URINE: NEGATIVE
Glucose, UA: NEGATIVE mg/dL
Hgb urine dipstick: NEGATIVE
KETONES UR: NEGATIVE mg/dL
Leukocytes, UA: NEGATIVE
Nitrite: NEGATIVE
Protein, ur: NEGATIVE mg/dL
Specific Gravity, Urine: 1.011 (ref 1.005–1.030)
Urobilinogen, UA: 0.2 mg/dL (ref 0.0–1.0)
pH: 6.5 (ref 5.0–8.0)

## 2014-02-07 LAB — CBC
HCT: 37.4 % (ref 36.0–46.0)
Hemoglobin: 12.7 g/dL (ref 12.0–15.0)
MCH: 30.9 pg (ref 26.0–34.0)
MCHC: 34 g/dL (ref 30.0–36.0)
MCV: 91 fL (ref 78.0–100.0)
Platelets: 166 10*3/uL (ref 150–400)
RBC: 4.11 MIL/uL (ref 3.87–5.11)
RDW: 14.8 % (ref 11.5–15.5)
WBC: 6 10*3/uL (ref 4.0–10.5)

## 2014-02-07 LAB — BASIC METABOLIC PANEL
ANION GAP: 9 (ref 5–15)
BUN: 12 mg/dL (ref 6–23)
CO2: 25 mmol/L (ref 19–32)
CREATININE: 1.02 mg/dL (ref 0.50–1.10)
Calcium: 9.6 mg/dL (ref 8.4–10.5)
Chloride: 106 mmol/L (ref 96–112)
GFR calc Af Amer: 59 mL/min — ABNORMAL LOW (ref >90)
GFR calc non Af Amer: 51 mL/min — ABNORMAL LOW (ref >90)
GLUCOSE: 155 mg/dL — AB (ref 70–99)
Potassium: 4.3 mmol/L (ref 3.5–5.1)
Sodium: 140 mmol/L (ref 135–145)

## 2014-02-07 LAB — I-STAT TROPONIN, ED
Troponin i, poc: 0 ng/mL (ref 0.00–0.08)
Troponin i, poc: 0 ng/mL (ref 0.00–0.08)

## 2014-02-07 MED ORDER — HYDROCODONE-ACETAMINOPHEN 5-325 MG PO TABS
1.0000 | ORAL_TABLET | Freq: Once | ORAL | Status: AC
  Administered 2014-02-07: 1 via ORAL
  Filled 2014-02-07: qty 1

## 2014-02-07 NOTE — ED Notes (Signed)
Pt c/o chest pain x 3 months. Pain is intermittent, lasting 10-15 minutes and usually relieved with rest. Pt denies chest pain at this time. Pt also reports headache and dizziness when blood pressure is elevated.

## 2014-02-07 NOTE — ED Notes (Signed)
Pt to ED from home c/o hypertension and intermittent chest pain x 3 months. Reports dizziness when bending over and headaches when pressure is elevated. Pt reports being compliant with medications. Pt took 324 ASA prior to EMS arrival. EMS VS 250/90

## 2014-02-07 NOTE — ED Provider Notes (Signed)
CSN: [id number]     Arrival date & time 02/07/14  1915 History   First MD Initiated Contact with Patient 02/07/14 1926     Chief Complaint  Patient presents with  . Hypertension   (Consider location/radiation/quality/duration/timing/severity/associated sxs/prior Treatment) HPI  [name] is a 81 yo female presenting with report of recurrence of chest pain tonight.  This is an ongoing problem for that has been evaluated by Dr. Kennon Holter.  She was prescribed a medicine that seemed to make it better but then the prescription ran out and she has not requested a refill.  Tonight the pain began while she was watching TV.  She describes the pain a sharp pain that moves up both sides of her chest and goes back down.  She denies any shortness of breath with the pain.  This pain is now resolved.  She endorses generalized weakness but denies fevers, chills, cough, dizziness, headache, blurred vision or focal weakness.  Past Medical History  Diagnosis Date  . Anemia 04/03/2011  . Hypertension   . High cholesterol   . Type II diabetes mellitus     not taking any medication  . Anginal pain   . Exertional dyspnea   . GERD (gastroesophageal reflux disease)   . 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 Depression   . 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 Blind left eye 1999  . Hip fracture 07/17/11    fall from 1-2 feet; left   Past Surgical History  Procedure Laterality Date  . Tubal ligation  1970's  . Tongue biopsy  1998    "cancer"  . Intraocular prostheses insertion  1999    left  . Hip arthroplasty  07/18/2011    Procedure: ARTHROPLASTY BIPOLAR HIP;  Surgeon: Nita Sells, MD;  Location: [facility];  Service: Orthopedics;  Laterality: Left;   Family History  Problem Relation Age of Onset  . Diabetes type II Mother   . Diabetes type II Father   . Diabetes type II Sister   . Colon cancer Neg Hx    History  Substance Use Topics  . Smoking status: Former Smoker  -- 1.50 packs/day for 47 years    Types: Cigarettes    Quit date: 09/13/1997  . Smokeless tobacco: Never Used  . Alcohol Use: 10.2 oz/week    17 Shots of liquor per week     Comment: 07/17/11 "1/5 rum q weekend"   OB History    No data available     Review of Systems  Constitutional: Negative for fever and chills.  HENT: Negative for sore throat.   Eyes: Negative for visual disturbance.  Respiratory: Negative for cough and shortness of breath.   Cardiovascular: Positive for chest pain. Negative for leg swelling.  Gastrointestinal: Negative for nausea, vomiting and diarrhea.  Genitourinary: Negative for dysuria.  Musculoskeletal: Negative for myalgias.  Skin: Negative for rash.  Neurological: Negative for weakness, numbness and headaches.    Allergies  Review of patient's allergies indicates no known allergies.  Home Medications   Prior to Admission medications   Medication Sig Start Date End Date Taking? Authorizing Provider  ALPRAZolam Duanne Moron) 0.5 MG tablet Take 0.5 mg by mouth daily.    Historical Provider, MD  aspirin 81 MG tablet Take 81 mg by mouth daily.    Historical Provider, MD  calcium-vitamin D 250-100 MG-UNIT per tablet Take 1 tablet by mouth daily.    Historical Provider, MD  carvedilol (COREG) 3.125 MG tablet Take 1 tablet (3.125 mg total) by mouth 2 (two) times daily with a meal. 03/15/13   Belva Crome III, MD  felodipine (PLENDIL) 2.5 MG 24 hr tablet Take 2.5 mg by mouth 2 (two) times daily.    Historical Provider, MD  ferrous sulfate 325 (65 FE) MG tablet Take 1 tablet (325 mg total) by mouth daily with breakfast. 07/22/11 07/21/12  Simbiso Ranga, MD  furosemide (LASIX) 20 MG tablet take 1 tablet by mouth once daily 04/26/13   Burtis Junes, NP  lisinopril (PRINIVIL,ZESTRIL) 20 MG tablet Take 20 mg by mouth 2 (two) times daily.     Historical Provider, MD  omeprazole (PRILOSEC) 20 MG capsule Take 20 mg by mouth daily.     Historical Provider, MD  pyridOXINE  (VITAMIN B-6) 100 MG tablet Take 100 mg by mouth daily.    Historical Provider, MD  simvastatin (ZOCOR) 40 MG tablet Take 40 mg by mouth every evening.    Historical Provider, MD  TRAVATAN Z 0.004 % SOLN ophthalmic solution Place 1 drop into the right eye at bedtime.  11/24/12   Historical Provider, MD   BP 179/63 mmHg  Pulse 73  Temp(Src) 98.3 F (36.8 C)  Resp 18  SpO2 99% Physical Exam  Constitutional: She is oriented to person, place, and time. She appears well-developed and well-nourished. No distress.  HENT:  Head: Normocephalic and atraumatic.  Eyes: Conjunctivae are normal. Pupils are equal, round, and reactive to light.  Neck: Neck supple. No thyromegaly present.  Cardiovascular: Normal rate, regular rhythm, S1 normal, S2 normal and intact distal pulses.  Exam reveals no gallop and no friction rub.   No murmur heard. Currently pain free  Pulmonary/Chest: Effort normal and breath sounds normal. No respiratory distress. She has no decreased breath sounds. She has no wheezes. She has no rhonchi. She has no rales.   She exhibits no tenderness.  Abdominal: Soft. There is no tenderness.  Musculoskeletal: She exhibits no tenderness.  Lymphadenopathy:    She has no cervical adenopathy.  Neurological: She is alert and oriented to person, place, and time. No cranial nerve deficit. Coordination normal.  Skin: Skin is warm and dry. No rash noted. She is not diaphoretic.  Psychiatric: She has a normal mood and affect.  Nursing note and vitals reviewed.   ED Course  Procedures (including critical care time) Labs Review Labs Reviewed  BASIC METABOLIC PANEL - Abnormal; Notable for the following:    Glucose, Bld 155 (*)    GFR calc non Af Amer 51 (*)    GFR calc Af Amer 59 (*)    All other components within normal limits  CBC  URINALYSIS, ROUTINE W REFLEX MICROSCOPIC  I-STAT TROPOININ, ED  I-STAT TROPOININ, ED    Imaging Review Dg Chest 2 View  02/07/2014   CLINICAL DATA:   Chest tightness with difficulty breathing  EXAM: CHEST  2 VIEW  COMPARISON:  January 25, 2014  FINDINGS: There is scarring in the left upper lobe near the apex. There is no edema or consolidation. Areas of peribronchial thickening are felt to be indicative of a degree of chronic bronchitis, stable. Heart size and pulmonary vascularity are normal. No adenopathy. Aorta is mildly tortuous but stable. There is degenerative change in the thoracic spine.  IMPRESSION: No edema or consolidation. Evidence suggesting chronic bronchitis, stable.   Electronically Signed   By: Lowella Grip M.D.   On: 02/07/2014 21:11  EKG Interpretation   Date/Time:  Tuesday February 07 2014 19:23:05 EST Ventricular Rate:  95 PR Interval:  161 QRS Duration: 84 QT Interval:  353 QTC Calculation: 444 R Axis:   19 Text Interpretation:  Sinus rhythm Supraventricular bigeminy Probable left  atrial enlargement LVH with secondary repolarization abnormality Anterior  Q waves, possibly due to LVH Otherwise no significant change Confirmed by  HARRISON  MD, FORREST ([id number]) on 02/07/2014 7:30:51 PM      MDM   Final diagnoses:  Chest pain, unspecified chest pain type   80 yo with report of calling EMS due to recurrence of chest pain that has been ongoing for several months tonight.  This pain has been evaluated by her PCP and was unchanged tonight.  Throughout her ED visit and during my exam she has been pain free and without any complaints. She is hypertensive during her visit but she remains chest pain free and without any shortness of breath. She reports her blood pressure medicine that has been prescribed bid, she has been taken one time a day to make it last longer.  Discussed the case with Dr. Aline Brochure. CBC, BMP, EKG, CXR, troponin and delta troponin without significant abnormalities.  She is well-appearing and in no distress. Patient is to be discharged with recommendation to follow up with PCP in regards to today's  hospital visit. Near end of visit, pt reports she feels like she is getting a headache and requests a tylenol and something to help her sleep. Pt given one vicodin tablet.  Pt has been advised to return to the ED if CP becomes exertional, associated with diaphoresis or nausea, radiates to left jaw/arm, worsens or becomes concerning in any way. Pt appears reliable for follow up and is agreeable to discharge.     Filed Vitals:   02/07/14 2230 02/07/14 2245 02/07/14 2300 02/07/14 2315  BP: 176/75 173/69 180/64 191/76  Pulse: 70 70 71 67  Temp:      Resp:  19 18 20   SpO2: 95% 97% 99% 97%    Meds given in ED:  Medications  HYDROcodone-acetaminophen (NORCO/VICODIN) 5-325 MG per tablet 1 tablet (1 tablet Oral Given 02/07/14 2340)    Discharge Medication List as of 02/08/2014 12:06 AM         Britt Bottom, NP 02/09/14 [id number]  Pamella Pert, MD 02/09/14 1147

## 2014-02-08 NOTE — Discharge Instructions (Signed)
Please follow the directions provided.  Be sure to follow-up with Dr. Kennon Holter regarding your continuing pain and your visit to the emergency department.  Continue to take your medicines as prescribed.  Don't hesitate to return for any new, worsening or concerning symptoms.     SEEK IMMEDIATE MEDICAL CARE IF:  You have increased chest pain or pain that spreads to your arm, neck, jaw, back, or abdomen.  You have shortness of breath.  You have an increasing cough, or you cough up blood.  You have severe back or abdominal pain.  You feel nauseous or vomit.  You have severe weakness.  You faint.  You have chills. This is an emergency. Do not wait to see if the pain will go away. Get medical help at once. Call your local emergency services (911 in [place] Do not drive yourself to the hospital.

## 2014-02-17 ENCOUNTER — Emergency Department (HOSPITAL_COMMUNITY): Payer: Medicare Other

## 2014-02-17 ENCOUNTER — Encounter (HOSPITAL_COMMUNITY): Payer: Self-pay

## 2014-02-17 ENCOUNTER — Observation Stay (HOSPITAL_COMMUNITY)
Admission: EM | Admit: 2014-02-17 | Discharge: 2014-02-18 | Disposition: A | Discharge status: Home / Self Care | Payer: Medicare Other | Attending: Internal Medicine | Admitting: Internal Medicine

## 2014-02-17 DIAGNOSIS — Z7982 Long term (current) use of aspirin: Secondary | ICD-10-CM | POA: Insufficient documentation

## 2014-02-17 DIAGNOSIS — Z8781 Personal history of (healed) traumatic fracture: Secondary | ICD-10-CM | POA: Diagnosis not present

## 2014-02-17 DIAGNOSIS — E119 Type 2 diabetes mellitus without complications: Secondary | ICD-10-CM

## 2014-02-17 DIAGNOSIS — E780 Pure hypercholesterolemia: Secondary | ICD-10-CM | POA: Insufficient documentation

## 2014-02-17 DIAGNOSIS — H5442 Blindness, left eye, normal vision right eye: Secondary | ICD-10-CM | POA: Insufficient documentation

## 2014-02-17 DIAGNOSIS — Z79899 Other long term (current) drug therapy: Secondary | ICD-10-CM | POA: Diagnosis not present

## 2014-02-17 DIAGNOSIS — F329 Major depressive disorder, single episode, unspecified: Secondary | ICD-10-CM | POA: Diagnosis not present

## 2014-02-17 DIAGNOSIS — D649 Anemia, unspecified: Secondary | ICD-10-CM | POA: Diagnosis not present

## 2014-02-17 DIAGNOSIS — R0789 Other chest pain: Principal | ICD-10-CM | POA: Insufficient documentation

## 2014-02-17 DIAGNOSIS — R079 Chest pain, unspecified: Secondary | ICD-10-CM | POA: Diagnosis present

## 2014-02-17 DIAGNOSIS — R51 Headache: Secondary | ICD-10-CM | POA: Insufficient documentation

## 2014-02-17 DIAGNOSIS — I209 Angina pectoris, unspecified: Secondary | ICD-10-CM | POA: Insufficient documentation

## 2014-02-17 DIAGNOSIS — I160 Hypertensive urgency: Secondary | ICD-10-CM

## 2014-02-17 DIAGNOSIS — Z87891 Personal history of nicotine dependence: Secondary | ICD-10-CM | POA: Diagnosis not present

## 2014-02-17 DIAGNOSIS — K219 Gastro-esophageal reflux disease without esophagitis: Secondary | ICD-10-CM | POA: Diagnosis not present

## 2014-02-17 DIAGNOSIS — I10 Essential (primary) hypertension: Secondary | ICD-10-CM | POA: Diagnosis present

## 2014-02-17 DIAGNOSIS — Z85810 Personal history of malignant neoplasm of tongue: Secondary | ICD-10-CM | POA: Diagnosis not present

## 2014-02-17 LAB — BASIC METABOLIC PANEL
Anion gap: 5 (ref 5–15)
BUN: 12 mg/dL (ref 6–23)
CALCIUM: 9.9 mg/dL (ref 8.4–10.5)
CO2: 33 mmol/L — AB (ref 19–32)
Chloride: 104 mmol/L (ref 96–112)
Creatinine, Ser: 1.08 mg/dL (ref 0.50–1.10)
GFR calc non Af Amer: 48 mL/min — ABNORMAL LOW (ref >90)
GFR, EST AFRICAN AMERICAN: 55 mL/min — AB (ref >90)
GLUCOSE: 120 mg/dL — AB (ref 70–99)
POTASSIUM: 4.8 mmol/L (ref 3.5–5.1)
Sodium: 142 mmol/L (ref 135–145)

## 2014-02-17 LAB — TROPONIN I

## 2014-02-17 LAB — GLUCOSE, CAPILLARY: Glucose-Capillary: 119 mg/dL — ABNORMAL HIGH (ref 70–99)

## 2014-02-17 LAB — CBC
HCT: 41.7 % (ref 36.0–46.0)
Hemoglobin: 14.2 g/dL (ref 12.0–15.0)
MCH: 30.9 pg (ref 26.0–34.0)
MCHC: 34.1 g/dL (ref 30.0–36.0)
MCV: 90.7 fL (ref 78.0–100.0)
Platelets: 236 10*3/uL (ref 150–400)
RBC: 4.6 MIL/uL (ref 3.87–5.11)
RDW: 14.7 % (ref 11.5–15.5)
WBC: 6.4 10*3/uL (ref 4.0–10.5)

## 2014-02-17 LAB — I-STAT TROPONIN, ED
Troponin i, poc: 0.01 ng/mL (ref 0.00–0.08)
Troponin i, poc: 0.01 ng/mL (ref 0.00–0.08)

## 2014-02-17 LAB — ETHANOL: Alcohol, Ethyl (B): <5 mg/dL (ref 0–9)

## 2014-02-17 LAB — BRAIN NATRIURETIC PEPTIDE: B NATRIURETIC PEPTIDE 5: 322.7 pg/mL — AB (ref 0.0–100.0)

## 2014-02-17 MED ORDER — SIMVASTATIN 40 MG PO TABS
40.0000 mg | ORAL_TABLET | Freq: Every morning | ORAL | Status: DC
  Administered 2014-02-18: 40 mg via ORAL
  Filled 2014-02-17: qty 1

## 2014-02-17 MED ORDER — LABETALOL HCL 5 MG/ML IV SOLN
10.0000 mg | Freq: Once | INTRAVENOUS | Status: AC
  Administered 2014-02-17: 10 mg via INTRAVENOUS
  Filled 2014-02-17: qty 4

## 2014-02-17 MED ORDER — CARVEDILOL 3.125 MG PO TABS
3.1250 mg | ORAL_TABLET | Freq: Two times a day (BID) | ORAL | Status: DC
  Administered 2014-02-18: 3.125 mg via ORAL
  Filled 2014-02-17: qty 1

## 2014-02-17 MED ORDER — FUROSEMIDE 20 MG PO TABS
20.0000 mg | ORAL_TABLET | Freq: Every day | ORAL | Status: DC
  Administered 2014-02-18: 20 mg via ORAL
  Filled 2014-02-17: qty 1

## 2014-02-17 MED ORDER — ASPIRIN EC 81 MG PO TBEC
81.0000 mg | DELAYED_RELEASE_TABLET | Freq: Every day | ORAL | Status: DC
  Administered 2014-02-18: 81 mg via ORAL
  Filled 2014-02-17: qty 1

## 2014-02-17 MED ORDER — INFLUENZA VAC SPLIT QUAD 0.5 ML IM SUSY
0.5000 mL | PREFILLED_SYRINGE | INTRAMUSCULAR | Status: AC
  Administered 2014-02-18: 0.5 mL via INTRAMUSCULAR
  Filled 2014-02-17: qty 0.5

## 2014-02-17 MED ORDER — LABETALOL HCL 5 MG/ML IV SOLN: 5.0000 mg | INTRAVENOUS | Status: DC | PRN

## 2014-02-17 MED ORDER — ASPIRIN 81 MG PO CHEW
324.0000 mg | CHEWABLE_TABLET | Freq: Once | ORAL | Status: AC
  Administered 2014-02-17: 324 mg via ORAL
  Filled 2014-02-17: qty 4

## 2014-02-17 MED ORDER — GI COCKTAIL ~~LOC~~: 30.0000 mL | Freq: Once | ORAL | Status: DC

## 2014-02-17 MED ORDER — ASPIRIN 81 MG PO TABS: 81.0000 mg | ORAL_TABLET | Freq: Every day | ORAL | Status: DC

## 2014-02-17 MED ORDER — LISINOPRIL 40 MG PO TABS
40.0000 mg | ORAL_TABLET | Freq: Every day | ORAL | Status: DC
  Administered 2014-02-18: 40 mg via ORAL
  Filled 2014-02-17: qty 1

## 2014-02-17 MED ORDER — PANTOPRAZOLE SODIUM 40 MG PO TBEC
40.0000 mg | DELAYED_RELEASE_TABLET | Freq: Every day | ORAL | Status: DC
  Administered 2014-02-18: 40 mg via ORAL
  Filled 2014-02-17: qty 1

## 2014-02-17 MED ORDER — LATANOPROST 0.005 % OP SOLN
1.0000 [drp] | Freq: Every day | OPHTHALMIC | Status: DC
  Administered 2014-02-17: 1 [drp] via OPHTHALMIC
  Filled 2014-02-17: qty 2.5

## 2014-02-17 MED ORDER — HEPARIN SODIUM (PORCINE) 5000 UNIT/ML IJ SOLN
5000.0000 [IU] | Freq: Three times a day (TID) | INTRAMUSCULAR | Status: DC
  Administered 2014-02-17 – 2014-02-18 (×2): 5000 [IU] via SUBCUTANEOUS
  Filled 2014-02-17 (×2): qty 1

## 2014-02-17 MED ORDER — ALPRAZOLAM 0.5 MG PO TABS
0.5000 mg | ORAL_TABLET | Freq: Every day | ORAL | Status: DC
  Administered 2014-02-18: 0.5 mg via ORAL
  Filled 2014-02-17: qty 1

## 2014-02-17 MED ORDER — SODIUM CHLORIDE 0.9 % IJ SOLN
3.0000 mL | Freq: Two times a day (BID) | INTRAMUSCULAR | Status: DC
  Administered 2014-02-17: 3 mL via INTRAVENOUS

## 2014-02-17 MED ORDER — ACETAMINOPHEN 325 MG PO TABS: 650.0000 mg | ORAL_TABLET | Freq: Four times a day (QID) | ORAL | Status: DC | PRN

## 2014-02-17 MED ORDER — ACETAMINOPHEN 650 MG RE SUPP: 650.0000 mg | Freq: Four times a day (QID) | RECTAL | Status: DC | PRN

## 2014-02-17 NOTE — ED Notes (Signed)
Attempted report x 2 

## 2014-02-17 NOTE — ED Notes (Signed)
Pt presents with chest tightness "for months".  Pt reports her home health nurse came today, took her blood pressure (systolic [id number]).  Pt denies any headache.  Pt reports shortness of breath, especially with exertion.  Pt also reports L ear pain.

## 2014-02-17 NOTE — ED Provider Notes (Signed)
CSN: [id number]     Arrival date & time 02/17/14  1232 History   First MD Initiated Contact with Patient 02/17/14 1459     No chief complaint on file.    (Consider location/radiation/quality/duration/timing/severity/associated sxs/prior Treatment) HPI Comments: 81 year old female with history of anemia, high blood pressure, diabetes, lipids, diastolic heart failure presents with high blood pressure and chest tightness. Patient has had recurrent chest tightness lasting different time periods typically a few hours. This is been more frequent with no specific association. She does not feel it's worse with exertion or eating. Patient's had this in the past few months however more frequent recently. Patient last episode was prior to arrival no symptoms currently. Patient has Dr. Tamala Julian cardiologist follow-up with. No changes in medications, no worsening orthopnea waking her leg swelling. No active cancer or shortness of breath. No unilateral leg symptoms. No radiation of chest tightness and no diaphoresis with it. This is similar to previous patient takes lisinopril, Lasix and carvedilol as directed without missed doses. Patient unsure what her normal blood pressure is. Patient had her blood pressure meds this morning.  The history is provided by the patient and a relative.    Past Medical History  Diagnosis Date  . Anemia 04/03/2011  . Hypertension   . High cholesterol   . Type II diabetes mellitus     not taking any medication  . Anginal pain   . Exertional dyspnea   . GERD (gastroesophageal reflux disease)   . 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 Depression   . 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 Blind left eye 1999  . Hip fracture 07/17/11    fall from 1-2 feet; left   Past Surgical History  Procedure Laterality Date  . Tubal ligation  1970's  . Tongue biopsy  1998    "cancer"  . Intraocular prostheses insertion  1999    left  . Hip arthroplasty  07/18/2011   Procedure: ARTHROPLASTY BIPOLAR HIP;  Surgeon: Nita Sells, MD;  Location: [place];  Service: Orthopedics;  Laterality: Left;   Family History  Problem Relation Age of Onset  . Diabetes type II Mother   . Diabetes type II Father   . Diabetes type II Sister   . Colon cancer Neg Hx    History  Substance Use Topics  . Smoking status: Former Smoker -- 1.50 packs/day for 47 years    Types: Cigarettes    Quit date: 09/13/1997  . Smokeless tobacco: Never Used  . Alcohol Use: 10.2 oz/week    17 Shots of liquor per week     Comment: 07/17/11 "1/5 rum q weekend"   OB History    No data available     Review of Systems  Constitutional: Positive for fatigue. Negative for fever and chills.  HENT: Negative for congestion.   Eyes: Negative for visual disturbance.  Respiratory: Negative for shortness of breath.   Cardiovascular: Positive for chest pain.  Gastrointestinal: Negative for vomiting and abdominal pain.  Genitourinary: Negative for dysuria and flank pain.  Musculoskeletal: Negative for back pain, neck pain and neck stiffness.  Skin: Negative for rash.  Neurological: Positive for headaches (resolved). Negative for light-headedness.      Allergies  Review of patient's allergies indicates no known allergies.  Home Medications   Prior to Admission medications   Medication Sig Start Date End Date Taking? Authorizing Provider  ALPRAZolam Duanne Moron) 0.5 MG tablet Take 0.5  mg by mouth daily.   Yes Historical Provider, MD  aspirin 81 MG tablet Take 81 mg by mouth daily.   Yes Historical Provider, MD  calcium-vitamin D 250-100 MG-UNIT per tablet Take 1 tablet by mouth daily.   Yes Historical Provider, MD  carvedilol (COREG) 3.125 MG tablet Take 1 tablet (3.125 mg total) by mouth 2 (two) times daily with a meal. 03/15/13  Yes Belva Crome III, MD  furosemide (LASIX) 20 MG tablet take 1 tablet by mouth once daily 04/26/13  Yes Burtis Junes, NP  lisinopril (PRINIVIL,ZESTRIL) 20 MG  tablet Take 40 mg by mouth daily.    Yes Historical Provider, MD  Multiple Vitamin (MULTIVITAMIN WITH MINERALS) TABS tablet Take 1 tablet by mouth daily.   Yes Historical Provider, MD  omeprazole (PRILOSEC) 20 MG capsule Take 20 mg by mouth daily.    Yes Historical Provider, MD  pyridOXINE (VITAMIN B-6) 100 MG tablet Take 100 mg by mouth daily.   Yes Historical Provider, MD  simvastatin (ZOCOR) 40 MG tablet Take 40 mg by mouth every morning.    Yes Historical Provider, MD  TRAVATAN Z 0.004 % SOLN ophthalmic solution Place 1 drop into the right eye at bedtime.  11/24/12  Yes Historical Provider, MD  ferrous sulfate 325 (65 FE) MG tablet Take 1 tablet (325 mg total) by mouth daily with breakfast. 07/22/11 07/21/12  Simbiso Ranga, MD   BP 100/57 mmHg  Pulse 84  Temp(Src) 97.9 F (36.6 C) (Oral)  Resp 20  Ht 5' (1.524 m)  Wt 131 lb 1.6 oz (59.467 kg)  BMI 25.60 kg/m2  SpO2 99% Physical Exam  Constitutional: She is oriented to person, place, and time. She appears well-developed and well-nourished.  HENT:  Head: Normocephalic and atraumatic.  Eyes: Right eye exhibits no discharge. Left eye exhibits no discharge.  Neck: Normal range of motion. Neck supple. No tracheal deviation present.  Cardiovascular: Normal rate, regular rhythm and intact distal pulses.   Pulmonary/Chest: Effort normal and breath sounds normal.  Abdominal: Soft. She exhibits no distension. There is no tenderness. There is no guarding.  Musculoskeletal: She exhibits no edema.  Neurological: She is alert and oriented to person, place, and time.  Skin: Skin is warm. No rash noted.  Psychiatric: She has a normal mood and affect.  Nursing note and vitals reviewed.   ED Course  Procedures (including critical care time) Emergency Ultrasound Study:   Angiocath insertion Performed by: Mariea Clonts  Consent: Verbal consent obtained. Risks and benefits: risks, benefits and alternatives were discussed Immediately prior to  procedure the correct patient, procedure, equipment, support staff and site/side marked as needed.  Indication: difficult IV access Preparation: Patient was prepped and draped in the usual sterile fashion. Vein Location: right ac vein was visualized during assessment for potential access sites and was found to be patent/ easily compressed with linear ultrasound.  The needle was visualized with real-time ultrasound and guided into the vein. Gauge 20 g  Image saved and stored.  Normal blood return.  Patient tolerance: Patient tolerated the procedure well with no immediate complications.     Labs Review Labs Reviewed  BASIC METABOLIC PANEL - Abnormal; Notable for the following:    CO2 33 (*)    Glucose, Bld 120 (*)    GFR calc non Af Amer 48 (*)    GFR calc Af Amer 55 (*)    All other components within normal limits  BRAIN NATRIURETIC PEPTIDE - Abnormal; Notable for  the following:    B Natriuretic Peptide 322.7 (*)    All other components within normal limits  GLUCOSE, CAPILLARY - Abnormal; Notable for the following:    Glucose-Capillary 119 (*)    All other components within normal limits  CBC  TROPONIN I  ETHANOL  TROPONIN I  URINE RAPID DRUG SCREEN (HOSP PERFORMED)  HEMOGLOBIN 123XX123  BASIC METABOLIC PANEL  CBC  I-STAT TROPOININ, ED  I-STAT TROPOININ, ED    Imaging Review Dg Chest 2 View  02/17/2014   CLINICAL DATA:  Shortness of breath, tachycardia, bilateral chest tightness  EXAM: CHEST  2 VIEW  COMPARISON:  02/07/2014  FINDINGS: There is no focal parenchymal opacity, pleural effusion, or pneumothorax. The heart and mediastinal contours are unremarkable. There is thoracic aortic atherosclerosis.  The osseous structures are unremarkable.  IMPRESSION: No active cardiopulmonary disease.   Electronically Signed   By: Kathreen Devoid   On: 02/17/2014 14:07     EKG Interpretation   Date/Time:  Friday February 17 2014 12:53:14 EST Ventricular Rate:  88 PR Interval:  164 QRS  Duration: 88 QT Interval:  378 QTC Calculation: 457 R Axis:   -16 Text Interpretation:  Sinus rhythm with marked sinus arrhythmia Possible  Left atrial enlargement Left ventricular hypertrophy T wave inversions  inferior, lateral Abnormal ECG Confirmed by Staceyann Knouff  MD, Denim Kalmbach ([id number]) on  02/17/2014 3:03:02 PM      MDM   Final diagnoses:  Hypertensive urgency  Chest tightness   Patient presents with intermittent chest tightness and uncontrolled high blood pressure despite being compliant with medications. Patient was recently seen for similar in ER and discharged with outpatient follow-up. With recurrent chest tightness, worsening blood pressure and multiple risk factors for cardiac and I recommended admission to the hospital for further virus and treatment. Unassigned paged. Family in the room during discussion.  Paged unassigned, re-paged unassigned.  Patient remains a symptomatically blood pressure remained elevated, labetalol IV.  Plan for admission from full high blood pressure and chest pain and moderate risk patient.  The patients results and plan were reviewed and discussed.   Any x-rays performed were personally reviewed by myself.   Differential diagnosis were considered with the presenting HPI.  Medications  simvastatin (ZOCOR) tablet 40 mg (not administered)  lisinopril (PRINIVIL,ZESTRIL) tablet 40 mg (not administered)  carvedilol (COREG) tablet 3.125 mg (not administered)  furosemide (LASIX) tablet 20 mg (not administered)  ALPRAZolam (XANAX) tablet 0.5 mg (not administered)  latanoprost (XALATAN) 0.005 % ophthalmic solution 1 drop (1 drop Right Eye Given 02/17/14 2125)  pantoprazole (PROTONIX) EC tablet 40 mg (not administered)  heparin injection 5,000 Units (5,000 Units Subcutaneous Given 02/17/14 2124)  sodium chloride 0.9 % injection 3 mL (3 mLs Intravenous Given 02/17/14 2125)  acetaminophen (TYLENOL) tablet 650 mg (not administered)    Or  acetaminophen (TYLENOL)  suppository 650 mg (not administered)  gi cocktail (Maalox,Lidocaine,Donnatal) (not administered)  labetalol (NORMODYNE,TRANDATE) injection 5 mg (not administered)  aspirin EC tablet 81 mg (not administered)  Influenza vac split quadrivalent PF (FLUARIX) injection 0.5 mL (not administered)  labetalol (NORMODYNE,TRANDATE) injection 10 mg (10 mg Intravenous Given 02/17/14 1640)  aspirin chewable tablet 324 mg (324 mg Oral Given 02/17/14 1729)    Filed Vitals:   02/17/14 2100 02/17/14 2156 02/17/14 2256 02/17/14 2356  BP:  153/91 150/75 100/57  Pulse:      Temp:      TempSrc:      Resp:      Height: 5' (1.524  m)     Weight: 131 lb 1.6 oz (59.467 kg)     SpO2:        Final diagnoses:  Hypertensive urgency  Chest tightness    Admission/ observation were discussed with the admitting physician, patient and/or family and they are comfortable with the plan.  ]   Mariea Clonts, MD 02/18/14 [phone]

## 2014-02-17 NOTE — ED Notes (Signed)
Attempted report x1. 

## 2014-02-17 NOTE — Progress Notes (Signed)
Attempted to get report for incoming pt. RN not available at this time.

## 2014-02-17 NOTE — H&P (Signed)
Date: 02/17/2014               Patient Name:  [name] MRN: [id number]  DOB: [birth year] Age / Sex: 81 y.o., female   PCP: Elizabeth Palau, MD              Medical Service: Internal Medicine Teaching Service      Attending Physician: Dr. Thayer Headings, MD    First Contact: Lawana Chambers, MS3 Pager: [phone]  Second Contact: Dr. Ethelene Hal Pager: [phone]  Third Contact Dr. Redmond Pulling Pager: [phone]       After Hours (After 5p/  First Contact Pager: [phone]  weekends / holidays): Second Contact Pager: [phone]   Chief Complaint: Chest tightness  History of Present Illness: [name] is a 81 y.o. female with PMH of HTN, T2DM, HLD, anxiety, and GERD who presented with chest tightness. She states that her sx started about 6 months but she kept ignoring it until it has now worsened over the past two weeks. She had a systolic BP in [id number] today and was told by her home health nurse to go to ED. She feels the chest discomfort suddenly comes and goes but there is no specific trigger. Walking long distances makes it worse and rest makes it better. She points to the location as epigastric and substernal but does not radiate anywhere. Describes the pain as "feeling of tightness." She has a hx of reflux but states this pain is different. She rates it as a 8 out of 10 on the scale. She endorses diaphoresis only at night while sleeping. Denies SOB, HA, n/v/d, fevers, chills. No recent illness, sick contact or travel hx. She states that she does not take her meds on Fridays because that is her day to take a break and to have few drinks however she took her meds today because she was feeling worse. She did not endorse any pain or discomfort at the time of interview.  Patient was recently seen in ED on 02/07/14 for chest pain and hypertension. Cardiac workup was negative at the time. She was not admitted to inpatient service and discharged from ED with instructions to f/u with her PCP.  In the  emergency department, labs showed BNP 322.7; negative troponin x 2, normal CXR, EKG showed sinus rhythm with arrhythmia and old T wave inversions. She received ASA 324 mg and labetalol 10 mg. She was admitted to medicine service for further workup.  CV risk factors: Age, HTN, HLD, DM, and former smoker.  Meds: Current Facility-Administered Medications  Medication Dose Route Frequency Provider Last Rate Last Dose  . acetaminophen (TYLENOL) tablet 650 mg  650 mg Oral Q6H PRN Francesca Oman, DO       Or  . acetaminophen (TYLENOL) suppository 650 mg  650 mg Rectal Q6H PRN Francesca Oman, DO      . Derrill Memo ON 02/18/2014] ALPRAZolam Duanne Moron) tablet 0.5 mg  0.5 mg Oral Daily Francesca Oman, DO      . [START ON 02/18/2014] aspirin EC tablet 81 mg  81 mg Oral Daily Francesca Oman, DO      . [START ON 02/18/2014] carvedilol (COREG) tablet 3.125 mg  3.125 mg Oral BID WC Francesca Oman, DO      . [START ON 02/18/2014] furosemide (LASIX) tablet 20 mg  20 mg Oral Daily Francesca Oman, DO      . gi cocktail (Maalox,Lidocaine,Donnatal)  30 mL Oral Once Francesca Oman, DO      .  heparin injection 5,000 Units  5,000 Units Subcutaneous 3 times per day Francesca Oman, DO   5,000 Units at 02/17/14 2124  . [START ON 02/18/2014] Influenza vac split quadrivalent PF (FLUARIX) injection 0.5 mL  0.5 mL Intramuscular Tomorrow-1000 Thayer Headings, MD      . labetalol (NORMODYNE,TRANDATE) injection 5 mg  5 mg Intravenous Q10 min PRN Kelby Aline, MD      . latanoprost (XALATAN) 0.005 % ophthalmic solution 1 drop  1 drop Right Eye QHS Francesca Oman, DO   1 drop at 02/17/14 2125  . [START ON 02/18/2014] lisinopril (PRINIVIL,ZESTRIL) tablet 40 mg  40 mg Oral Daily Francesca Oman, DO      . [START ON 02/18/2014] pantoprazole (PROTONIX) EC tablet 40 mg  40 mg Oral Daily Francesca Oman, DO      . Derrill Memo ON 02/18/2014] simvastatin (ZOCOR) tablet 40 mg  40 mg Oral q morning - 10a Francesca Oman, DO      . sodium chloride 0.9 % injection 3 mL  3 mL  Intravenous Q12H Francesca Oman, DO   3 mL at 02/17/14 2125    Allergies: Allergies as of 02/17/2014  . (No Known Allergies)   Past Medical History  Diagnosis Date  . Anemia 04/03/2011  . Hypertension   . High cholesterol   . Type II diabetes mellitus     not taking any medication  . Anginal pain   . Exertional dyspnea   . GERD (gastroesophageal reflux disease)   . 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 Depression   . 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 Blind left eye 1999  . Hip fracture 07/17/11    fall from 1-2 feet; left   Past Surgical History  Procedure Laterality Date  . Tubal ligation  1970's  . Tongue biopsy  1998    "cancer"  . Intraocular prostheses insertion  1999    left  . Hip arthroplasty  07/18/2011    Procedure: ARTHROPLASTY BIPOLAR HIP;  Surgeon: Nita Sells, MD;  Location: [place];  Service: Orthopedics;  Laterality: Left;    Family History: Family History  Problem Relation Age of Onset  . Diabetes type II Mother   . Diabetes type II Father   . Diabetes type II Sister   . Colon cancer Neg Hx     Social History; History   Social History  . Marital Status: Widowed    Spouse Name: N/A    Number of Children: N/A  . Years of Education: N/A   Occupational History  . Not on file.   Social History Main Topics  . Smoking status: Former Smoker -- 1.50 packs/day for 47 years    Types: Cigarettes    Quit date: 09/13/1997  . Smokeless tobacco: Never Used  . Alcohol Use: 10.2 oz/week    17 Shots of liquor per week     Comment: 07/17/11 "1/5 rum q weekend"  . Drug Use: No     Comment: 07/17/11 "last drug use ~ 20 yr ago"  . Sexual Activity: Yes   Other Topics Concern  . Not on file   Social History Narrative    Review of Systems: Pertinent items are noted in HPI. All other pertinent ROS as stated in HPI.   Physical Exam: Blood pressure 156/84, pulse 84, temperature 97.9 F (36.6 C), temperature source Oral,  resp. rate 21, height 5' (1.524  m), weight 59.467 kg (131 lb 1.6 oz), SpO2 99 %.   Constitutional: Vital signs reviewed.  Patient lying in bed comfortabley in no acute distress and cooperative with exam.   Head: Normocephalic and atraumatic  Eyes: PERRL, prosthetic L eye Cardiovascular: regular rate, irregular rhythm, S1 normal, S2 normal, no MRG, pulses symmetric and intact bilaterally Pulmonary/Chest: normal respiratory effort, CTAB, no wheezes, rales, or rhonchi Abdominal: Soft. Non-tender, non-distended, bowel sounds are normal, no masses, organomegaly, or guarding Neurological: A&O x3,  Skin: Warm, dry and intact. No rash, cyanosis, or clubbing.    Lab results: Basic Metabolic Panel:  Recent Labs Lab 02/17/14 1255  NA 142  K 4.8  CL 104  CO2 33*  GLUCOSE 120*  BUN 12  CREATININE 1.08  CALCIUM 9.9   CBC:  Recent Labs Lab 02/17/14 1255  WBC 6.4  HGB 14.2  HCT 41.7  MCV 90.7  PLT 236    Imaging results:  Dg Chest 2 View  02/17/2014   CLINICAL DATA:  Shortness of breath, tachycardia, bilateral chest tightness  EXAM: CHEST  2 VIEW  COMPARISON:  02/07/2014  FINDINGS: There is no focal parenchymal opacity, pleural effusion, or pneumothorax. The heart and mediastinal contours are unremarkable. There is thoracic aortic atherosclerosis.  The osseous structures are unremarkable.  IMPRESSION: No active cardiopulmonary disease.   Electronically Signed   By: Kathreen Devoid   On: 02/17/2014 14:07    Other results: EKG: 88 bpm; Sinus rhythm with arrhythmia; (T wave inversions inferior and lateral - Old)  Assessment & Plan by Problem: Active Problems:   HTN (hypertension)   DM2 (diabetes mellitus, type 2)   Chest tightness   Hypertensive urgency   [name] is a 81 y.o. female with PMH significant for HTN, T2DM, HLD, and GERD who presented to ED for chest pain.  **Chest pain - Pt has had sx of CP that are concerning for atypical chest pain given negative troponins and  no ischemic changes on the EKG. Pt also states the pain is sometimes epigastric or substernal. Given the high risk factors (age, T2DM, HTN, HLD, and smoking hx) of pt, a cardiovascular workup is necessary to rule out CAD/ACS. This could also be GERD or anxiety given that pt has a hx of reflux and panic attacks - Trend troponin q6h - Telemtry and repeat EKG in AM - Cardiology consult - Continue home ASA 81mg  - UDS  **HTN - Pt has elevated pressures into 190s-210s/110s-120s during this admission. Pt admits to skipping her meds on Fridays to give herself a break. She also admits to skipping doses on meds to make the pills last longer. Current home regimen is: lisinopril 40mg  qd, lasix 20mg  qd, carvedilol 3.125mg  BID - Continue home meds: lisinopril 40mg  qd, lasix 20mg  qd, carvedilol 3.125mg  BID - Monitor BP closely and will add or titrate meds during this admission  **T2DM - Pt has not taken any diabetes meds for 5 years now. Last Hgb A1c was 6.1 in 09/2009. She has never been on insulin before. Currently s/p L eye removal due to diabetic retinopathy vs glaucoma? - Check Hgb A1c - Start diabetes meds based on A1c results  **HLD - Last lipid panel on 02/18/11 showed: Cholesterol 198, Triglycerides 167, HDL 71, LDL 94. Currently on home regimen on simvastatin 40 mg qd - Lipid panel labs - Continue home simvastatin 40mg  qd  Diet: Heart healthy diet Prophylaxis: Heparin Code: Full  Dispo: Disposition is deferred at this time, awaiting improvement of  current medical problems. Anticipated discharge in approximately 2-3 days.   The patient does have a current PCP Ollen Gross Colette Ribas, MD) and does need an [facility] hospital follow-up appointment after discharge.  The patient does not have transportation limitations that hinder transportation to clinic appointments.  This is a Careers information officer Note.  The care of the patient was discussed with Dr. Ethelene Hal and the assessment and plan was formulated with their  assistance.  Please see their note for official documentation of the patient encounter.   Signed: Lawana Chambers, Med Student Internal Medicine Teaching Service Team B2  [phone] 02/17/2014, 4:54 PM

## 2014-02-17 NOTE — H&P (Signed)
Date: 02/17/2014               Patient Name:  [name] MRN: [id number]  DOB: 23-Jan-1932 Age / Sex: 81 y.o., female   PCP: Elizabeth Palau, MD         Medical Service: Internal Medicine Teaching Service         Attending Physician: Dr. Thayer Headings, MD    First Contact: Dr. Lottie Mussel Pager: [phone]  Second Contact: Dr. Duwaine Maxin Pager: [phone]       After Hours (After 5p/  First Contact Pager: [phone]  weekends / holidays): Second Contact Pager: [phone]   Chief Complaint: chest pain  History of Present Illness: Mr Weekes is a 81 year old woman with CHF, HTN, DM2, HL, GERD, anxiety who presents with chest pain. She has complained of chest "tightness" for 6 months. She said her home health nurse came today and her SBP was 200 and sent her to the ED. The chest pain she describes as a tightness that usually lasts a few hours. She points to her epigastric/bottom of sternum for the location and says it is non-radiating. She has reflux but says this is different. It has increased in frequency of occurrence and she thinks it is related to her anxiety. She also thinks that it might come on when she walks a lot and is relieved when she rests. She had never had this sensation until six months ago. She denies any associated SOB, nausea, diaphoresis. She says she usually takes her medications every day except Fridays because that is the day she gives herself as a break but took them today because of this chest pain. She notes a 2 ppd x ~30 yr smoking history but quit 17 years ago. Reports remote history of THC use.  Of note, she was recently seen in the ED on 02/07/14 for sharp bilateral chest pain as well as BP 191/76 in the setting of taking her bid medications once a day to make it last longer. She was discharged and told to follow-up with PCP after CBC, BMP, EKG, CXR, trop without abnormalities.  In the ED, she received ASA 324 mg po once, labetalol 10 mg iv once for BP 217/104  which then went down to 160/114.  Meds: Current Facility-Administered Medications  Medication Dose Route Frequency Provider Last Rate Last Dose  . gi cocktail (Maalox,Lidocaine,Donnatal)  30 mL Oral Once Francesca Oman, DO      . labetalol (NORMODYNE,TRANDATE) injection 5 mg  5 mg Intravenous Q10 min PRN Kelby Aline, MD        Allergies: Allergies as of 02/17/2014  . (No Known Allergies)   Past Medical History  Diagnosis Date  . Anemia 04/03/2011  . Hypertension   . High cholesterol   . Type II diabetes mellitus     not taking any medication  . Anginal pain   . Exertional dyspnea   . GERD (gastroesophageal reflux disease)   . 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 Depression   . 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 Blind left eye 1999  . Hip fracture 07/17/11    fall from 1-2 feet; left   Past Surgical History  Procedure Laterality Date  . Tubal ligation  1970's  . Tongue biopsy  1998    "cancer"  . Intraocular prostheses insertion  1999    left  . Hip arthroplasty  07/18/2011  Procedure: ARTHROPLASTY BIPOLAR HIP;  Surgeon: Nita Sells, MD;  Location: [facility];  Service: Orthopedics;  Laterality: Left;   Family History  Problem Relation Age of Onset  . Diabetes type II Mother   . Diabetes type II Father   . Diabetes type II Sister   . Colon cancer Neg Hx    History   Social History  . Marital Status: Widowed    Spouse Name: N/A    Number of Children: N/A  . Years of Education: N/A   Occupational History  . Not on file.   Social History Main Topics  . Smoking status: Former Smoker -- 1.50 packs/day for 47 years    Types: Cigarettes    Quit date: 09/13/1997  . Smokeless tobacco: Never Used  . Alcohol Use: 10.2 oz/week    17 Shots of liquor per week     Comment: 07/17/11 "1/5 rum q weekend"  . Drug Use: No     Comment: 07/17/11 "last drug use ~ 20 yr ago"  . Sexual Activity: Yes   Other Topics Concern  . Not on file    Social History Narrative    Review of Systems: A comprehensive review of systems was negative except for: as noted above per HPI  Physical Exam: Blood pressure 156/124, pulse 86, temperature 98.3 F (36.8 C), temperature source Oral, resp. rate 19, SpO2 94 %.  Gen: A&O x 4, no acute distress, well developed, well nourished HEENT: Atraumatic, prosthetic L eye, EOMI, sclerae anicteric, moist mucous membranes Heart: Irregular rate and rhythm, normal S1 S2, no murmurs, rubs, or gallops Lungs: Clear to auscultation bilaterally, respirations unlabored Abd: Soft, non-tender, non-distended, + bowel sounds, no hepatosplenomegaly Ext: No edema or cyanosis  Lab results: Basic Metabolic Panel:  Recent Labs  02/17/14 1255  NA 142  K 4.8  CL 104  CO2 33*  GLUCOSE 120*  BUN 12  CREATININE 1.08  CALCIUM 9.9   CBC:  Recent Labs  02/17/14 1255  WBC 6.4  HGB 14.2  HCT 41.7  MCV 90.7  PLT 236   Imaging results:  Dg Chest 2 View  02/17/2014   CLINICAL DATA:  Shortness of breath, tachycardia, bilateral chest tightness  EXAM: CHEST  2 VIEW  COMPARISON:  02/07/2014  FINDINGS: There is no focal parenchymal opacity, pleural effusion, or pneumothorax. The heart and mediastinal contours are unremarkable. There is thoracic aortic atherosclerosis.  The osseous structures are unremarkable.  IMPRESSION: No active cardiopulmonary disease.   Electronically Signed   By: Kathreen Devoid   On: 02/17/2014 14:07    Other results: EKG: NSR, t-wave inversions noted on leads III, avF, V5, V6 also on prior from 03/16/13.  Assessment & Plan by Problem: Active Problems:   HTN (hypertension)   DM2 (diabetes mellitus, type 2)   Chest tightness   Hypertensive urgency  #Atypical Chest Pain: Ms Hanigan has atypical chest pain for six months. There seems to be an exertional component and she has risk factors of DM2, HTN, HL, smoking but the location is epigastric/bottom of sternum. EKG unchanged and troponin  negative. TIMI score of 4 is 20 percent risk at 14 days of all cause mortality, new or recurrent MI, or severe recurrent ischemia requiring urgent revascularization. This could also be GI given history of reflux and epigastric location but she says this feels different than GERD. It can also be musculoskeletal. Patient also reports there is an anxiety component.  -consider cards consult in am -trend troponin q6hprn -repeat  EKG in am -UDS -cont home ASA 81 mg daily, xanax 0.5 mg daily -protonix 40 mg daily -gi cocktail  -tylenol 650 mg po or pr q6hprn -cardiac monitoring  #Hypertensive Urgency: Unclear why Ms Northworthy's blood pressure is elevated. She reports no issues taking her medications for the past week although she admits to giving herself weekly breaks from medicines and previous clinic notes mention that she is taking her medications incorrectly. At home she is on ASA 81 mg daily, carvedilol 3.125 bid, lisinopril 40 mg daily, lasix 20 mg daily, simvastatin 40 mg daily -cont ASA 81 mg daily, carvedilol 3.125 bid, lisinopril 40 mg daily, lasix 20 mg daily, simvastatin 40 mg daily -labetalol 5 mg iv q15min for SBP>190, DBP>100  #CHF: No echo on record. Cardiology note from Dr Daneen Schick notes 05/2008 Dr Melvern Banker echo w EF [id number], mild diastolic LV dysfunction, [id number] echo EF 55-60%. At home she is on ASA 81 mg daily, carvedilol 3.125 mg bid, lisinopril 40 mg daily, and lasix 20 mg daily -cont ASA 81 mg daily, carvedilol 3.125 mg bid, lisinopril 40 mg daily, and lasix 20 mg daily  #DM2: Last A1c 6.1 but from 09/2009. She is s/p L eye removal which in EMR is attributed to her diabetes but unclear as she said she woke up in pool of blood. She is not on any medications for DM2 and says she never had to use insulin -recheck hemoglobin A1c -cbg q4h and hold on SSI for now  #HL: Last lipid profile 02/2011 with total cholesterol 198, triglyceride 167, HDL 71, and LDL 94. She is on simvastatin 40 mg  daily at home. 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 Pt reports she has been in remission since. She says as a result of radiation she has some difficulty hearing with her L ear.  #Diet: HH  #DVT PPx: heparin 5000 u [place]al[place] tid  #Code: full  Dispo: Disposition is deferred at this time, awaiting improvement of current medical problems. Anticipated discharge in approximately 1-2 day(s).   The patient does have a current PCP Ollen Gross Colette Ribas, MD) and does need an [facility] hospital follow-up appointment after discharge.  The patient does not know have transportation limitations that hinder transportation to clinic appointments.  Signed: Kelby Aline, MD 02/17/2014, 6:43 PM

## 2014-02-18 ENCOUNTER — Other Ambulatory Visit: Payer: Self-pay | Admitting: Nurse Practitioner

## 2014-02-18 DIAGNOSIS — I509 Heart failure, unspecified: Secondary | ICD-10-CM

## 2014-02-18 DIAGNOSIS — R0789 Other chest pain: Secondary | ICD-10-CM | POA: Diagnosis not present

## 2014-02-18 DIAGNOSIS — Z85810 Personal history of malignant neoplasm of tongue: Secondary | ICD-10-CM

## 2014-02-18 DIAGNOSIS — R51 Headache: Secondary | ICD-10-CM | POA: Diagnosis not present

## 2014-02-18 DIAGNOSIS — D649 Anemia, unspecified: Secondary | ICD-10-CM | POA: Diagnosis not present

## 2014-02-18 DIAGNOSIS — E785 Hyperlipidemia, unspecified: Secondary | ICD-10-CM

## 2014-02-18 DIAGNOSIS — I10 Essential (primary) hypertension: Secondary | ICD-10-CM

## 2014-02-18 DIAGNOSIS — Z87891 Personal history of nicotine dependence: Secondary | ICD-10-CM

## 2014-02-18 DIAGNOSIS — E119 Type 2 diabetes mellitus without complications: Secondary | ICD-10-CM

## 2014-02-18 DIAGNOSIS — Z7982 Long term (current) use of aspirin: Secondary | ICD-10-CM

## 2014-02-18 LAB — RAPID URINE DRUG SCREEN, HOSP PERFORMED
Amphetamines: NOT DETECTED
BARBITURATES: NOT DETECTED
BENZODIAZEPINES: POSITIVE — AB
Cocaine: NOT DETECTED
OPIATES: NOT DETECTED
TETRAHYDROCANNABINOL: NOT DETECTED

## 2014-02-18 LAB — CBC
HCT: 37.2 % (ref 36.0–46.0)
Hemoglobin: 12.3 g/dL (ref 12.0–15.0)
MCH: 30 pg (ref 26.0–34.0)
MCHC: 33.1 g/dL (ref 30.0–36.0)
MCV: 90.7 fL (ref 78.0–100.0)
Platelets: 198 10*3/uL (ref 150–400)
RBC: 4.1 MIL/uL (ref 3.87–5.11)
RDW: 14.8 % (ref 11.5–15.5)
WBC: 6.8 10*3/uL (ref 4.0–10.5)

## 2014-02-18 LAB — BASIC METABOLIC PANEL
ANION GAP: 11 (ref 5–15)
BUN: 15 mg/dL (ref 6–23)
CO2: 25 mmol/L (ref 19–32)
Calcium: 9.1 mg/dL (ref 8.4–10.5)
Chloride: 102 mmol/L (ref 96–112)
Creatinine, Ser: 1.35 mg/dL — ABNORMAL HIGH (ref 0.50–1.10)
GFR calc Af Amer: 42 mL/min — ABNORMAL LOW (ref >90)
GFR, EST NON AFRICAN AMERICAN: 36 mL/min — AB (ref >90)
Glucose, Bld: 108 mg/dL — ABNORMAL HIGH (ref 70–99)
POTASSIUM: 3.2 mmol/L — AB (ref 3.5–5.1)
Sodium: 138 mmol/L (ref 135–145)

## 2014-02-18 LAB — GLUCOSE, CAPILLARY
GLUCOSE-CAPILLARY: 78 mg/dL (ref 70–99)
GLUCOSE-CAPILLARY: 105 mg/dL — AB (ref 70–99)

## 2014-02-18 LAB — TROPONIN I

## 2014-02-18 NOTE — Discharge Summary (Signed)
Name: [name] MRN: [id number] DOB: 1932/11/21 81 y.o. PCP: Elizabeth Palau, MD  Date of Admission: 02/17/2014  2:34 PM Date of Discharge: 02/18/2014 Attending Physician: Thayer Headings, MD  Discharge Diagnosis:  Active Problems:   HTN (hypertension)   DM2 (diabetes mellitus, type 2)   Chest tightness   Hypertensive urgency  Discharge Medications:   Medication List    TAKE these medications        ALPRAZolam 0.5 MG tablet  Commonly known as:  XANAX  Take 0.5 mg by mouth daily.     aspirin 81 MG tablet  Take 81 mg by mouth daily.     calcium-vitamin D 250-100 MG-UNIT per tablet  Take 1 tablet by mouth daily.     carvedilol 3.125 MG tablet  Commonly known as:  COREG  Take 1 tablet (3.125 mg total) by mouth 2 (two) times daily with a meal.     ferrous sulfate 325 (65 FE) MG tablet  Take 1 tablet (325 mg total) by mouth daily with breakfast.     furosemide 20 MG tablet  Commonly known as:  LASIX  take 1 tablet by mouth once daily     lisinopril 20 MG tablet  Commonly known as:  PRINIVIL,ZESTRIL  Take 40 mg by mouth daily.     multivitamin with minerals Tabs tablet  Take 1 tablet by mouth daily.     omeprazole 20 MG capsule  Commonly known as:  PRILOSEC  Take 20 mg by mouth daily.     pyridOXINE 100 MG tablet  Commonly known as:  VITAMIN B-6  Take 100 mg by mouth daily.     simvastatin 40 MG tablet  Commonly known as:  ZOCOR  Take 40 mg by mouth every morning.     TRAVATAN Z 0.004 % Soln ophthalmic solution  Generic drug:  Travoprost (BAK Free)  Place 1 drop into the right eye at bedtime.        Disposition and follow-up:   [name] was discharged from [facility] in [place] condition.  At the hospital follow up visit please address:  1.  Medication compliance, BP control, chest discomfort  2.  Labs / imaging needed at time of follow-up: out-patient stress test  3.  Pending labs/ test needing follow-up:  hemoglobin a1c  Follow-up Appointments: Follow-up Information    Follow up with [facility].   Why:  Physical Therapy   Contact information:   [address] [place] [address] [phone]       Follow up with Truitt Merle, NP. Schedule an appointment as soon as possible for a visit in 1 week.   Specialty:  Nurse Practitioner   Why:  Call to make appointment within one week for stress test   Contact information:   [address] [place] [address] [phone]       Follow up with Elizabeth Palau, MD. Schedule an appointment as soon as possible for a visit in 1 week.   Specialty:  Family Medicine   Why:  Call to make appointment within one week   Contact information:   [place] [address] [phone]       Discharge Instructions: Discharge Instructions    Diet - low sodium heart healthy    Complete by:  As directed      Increase activity slowly    Complete by:  As directed  Procedures Performed:  Dg Chest 2 View  02/17/2014   CLINICAL DATA:  Shortness of breath, tachycardia, bilateral chest tightness  EXAM: CHEST  2 VIEW  COMPARISON:  02/07/2014  FINDINGS: There is no focal parenchymal opacity, pleural effusion, or pneumothorax. The heart and mediastinal contours are unremarkable. There is thoracic aortic atherosclerosis.  The osseous structures are unremarkable.  IMPRESSION: No active cardiopulmonary disease.   Electronically Signed   By: Kathreen Devoid   On: 02/17/2014 14:07   Dg Chest 2 View  02/07/2014   CLINICAL DATA:  Chest tightness with difficulty breathing  EXAM: CHEST  2 VIEW  COMPARISON:  January 25, 2014  FINDINGS: There is scarring in the left upper lobe near the apex. There is no edema or consolidation. Areas of peribronchial thickening are felt to be indicative of a degree of chronic bronchitis, stable. Heart size and pulmonary vascularity are normal. No adenopathy.  Aorta is mildly tortuous but stable. There is degenerative change in the thoracic spine.  IMPRESSION: No edema or consolidation. Evidence suggesting chronic bronchitis, stable.   Electronically Signed   By: Lowella Grip M.D.   On: 02/07/2014 21:11   Dg Chest 2 View  01/25/2014   CLINICAL DATA:  Cough and shortness of breath for 1 week. History of smoking.  EXAM: CHEST  2 VIEW  COMPARISON:  09/10/2011  FINDINGS: The cardiac silhouette, mediastinal and hilar contours are within normal limits and stable. There is tortuosity and calcification of the thoracic aorta. There are chronic bronchitic type lung changes, likely related to smoking. No infiltrates, edema or effusions. The bony thorax is intact.  IMPRESSION: Chronic bronchitic changes likely related to smoking. No acute overlying pulmonary findings.   Electronically Signed   By: Kalman Jewels M.D.   On: 01/25/2014 12:11   Admission HPI: Mr Lettman is a 81 year old woman with CHF, HTN, DM2, HL, GERD, anxiety who presents with chest pain. She has complained of chest "tightness" for 6 months. She said her home health nurse came today and her SBP was 200 and sent her to the ED. The chest pain she describes as a tightness that usually lasts a few hours. She points to her epigastric/bottom of sternum for the location and says it is non-radiating. She has reflux but says this is different. It has increased in frequency of occurrence and she thinks it is related to her anxiety. She also thinks that it might come on when she walks a lot and is relieved when she rests. She had never had this sensation until six months ago. She denies any associated SOB, nausea, diaphoresis. She says she usually takes her medications every day except Fridays because that is the day she gives herself as a break but took them today because of this chest pain. She notes a 2 ppd x ~30 yr smoking history but quit 17 years ago. Reports remote history of THC use.  Of note, she was  recently seen in the ED on 02/07/14 for sharp bilateral chest pain as well as BP 191/76 in the setting of taking her bid medications once a day to make it last longer. She was discharged and told to follow-up with PCP after CBC, BMP, EKG, CXR, trop without abnormalities.  In the ED, she received ASA 324 mg po once, labetalol 10 mg iv once for BP 217/104 which then went down to 160/114.  Hospital Course by problem list: Active Problems:   HTN (hypertension)   DM2 (diabetes mellitus, type 2)  Chest tightness   Hypertensive urgency   #Atypical Chest Pain: Ms Perretti has atypical chest pain for six months. There seems to be an exertional component and she has risk factors of DM2, HTN, HL, smoking but the location is epigastric/bottom of sternum. EKG unchanged and trended troponin negative. TIMI score of 4 is 20 percent risk at 14 days of all cause mortality, new or recurrent MI, or severe recurrent ischemia requiring urgent revascularization. This could also be GI given history of reflux and epigastric location and she says there was improvement with GI cocktail. Patient also reports there is an anxiety component. Spoke to cardiology who recommends out-patient stress test. [facility] will contact patient to schedule but she has been instructed to call if she does not hear from them early next week. PT recommends home health PT.  #Hypertensive Urgency: Unclear why Ms Northworthy's blood pressure is elevated. There have been issues with medication compliance in the past as she admits to giving herself weekly breaks from medicines and previous clinic notes mention that she is taking her medications incorrectly. For example, taking bid medications once a day. She did not require any prn labetalol other than in ED. BP was 170s/70s this morning but elevated to 200/92 following ambulation and returned to 170/80 with some rest and shortly after receiving morning doses. At home she is on ASA 81 mg daily, carvedilol  3.125 bid, lisinopril 40 mg daily, lasix 20 mg daily, simvastatin 40 mg daily. PCP should reassess and could consider adding on amlodipine if needed.  #CHF: No echo on record. Cardiology note from Dr Daneen Schick notes 05/2008 Dr Melvern Banker echo w EF [id number], mild diastolic LV dysfunction, [id number] echo EF 55-60%. At home she is on ASA 81 mg daily, carvedilol 3.125 mg bid, lisinopril 40 mg daily, and lasix 20 mg daily which were continued  #DM2: Last A1c 6.1 but from 09/2009. She is s/p L eye removal which in EMR is attributed to her diabetes but unclear as she said she woke up in pool of blood. She is not on any medications for DM2 and says she never had to use insulin. Hemoglobin A1c pending and PCP should follow-up.  #HL: Last lipid profile 02/2011 with total cholesterol 198, triglyceride 167, HDL 71, and LDL 94. She is on simvastatin 40 mg daily at home which was continued.  #s/p XRT 2/2 tongue cancer: XRT in 1999. Pt reports she has been in remission since. She says as a result of radiation she has some difficulty hearing with her L ear.  Discharge Vitals:   BP 170/80 mmHg  Pulse 70  Temp(Src) 98 F (36.7 C) (Oral)  Resp 20  Ht 5' (1.524 m)  Wt 131 lb 1.6 oz (59.467 kg)  BMI 25.60 kg/m2  SpO2 99%   Gen: A&O x 4, no acute distress, well developed, well nourished HEENT: Atraumatic, prosthetic L eye, EOMI, sclerae anicteric, moist mucous membranes Heart: sinus arrhythmia, normal S1 S2, no murmurs, rubs, or gallops Lungs: Clear to auscultation bilaterally, respirations unlabored Abd: Soft, non-tender, non-distended, + bowel sounds, no hepatosplenomegaly Ext: No edema or cyanosis  Discharge Labs:  Basic Metabolic Panel:  Recent Labs  02/17/14 1255 02/18/14 0138  NA 142 138  K 4.8 3.2*  CL 104 102  CO2 33* 25  GLUCOSE 120* 108*  BUN 12 15  CREATININE 1.08 1.35*  CALCIUM 9.9 9.1   CBC:  Recent Labs  02/17/14 1255 02/18/14 0138  WBC 6.4 6.8  HGB 14.2 12.3  HCT  41.7 37.2  MCV 90.7  90.7  PLT 236 198   Cardiac Enzymes:  Recent Labs  02/17/14 1938 02/18/14 0138  TROPONINI <0.03 <0.03  i-stat trop 0.00  CBG:  Recent Labs  02/17/14 2101 02/18/14 0743 02/18/14 1151  GLUCAP 119* 78 105*   Urine Drug Screen: Drugs of Abuse     Component Value Date/Time   LABOPIA NONE DETECTED 02/18/2014 0506   COCAINSCRNUR NONE DETECTED 02/18/2014 0506   LABBENZ POSITIVE* 02/18/2014 0506   AMPHETMU NONE DETECTED 02/18/2014 0506   THCU NONE DETECTED 02/18/2014 0506   LABBARB NONE DETECTED 02/18/2014 0506    Alcohol Level:  Recent Labs  02/17/14 2000  ETH <5   BNP 323   Signed: Kelby Aline, MD 02/18/2014, 2:18 PM    Services Ordered on Discharge: [place] Ordered on Discharge: none

## 2014-02-18 NOTE — Progress Notes (Signed)
Subjective: NAEON. Kayla [name] says she felt well overnight. She walked the halls with the RN and said she had some chest discomfort. She says at rest she has no complaints. I spoke to cardiology who said they would see her as an out-patient for out-patient stress test.  Objective: Vital signs in last 24 hours: Filed Vitals:   02/17/14 2256 02/17/14 2356 02/18/14 0400 02/18/14 1027  BP: 150/75 100/57 176/76 200/92  Pulse:   69 74  Temp:   98 F (36.7 C)   TempSrc:   Oral   Resp:      Height:      Weight:      SpO2:   99%    Weight change:   Intake/Output Summary (Last 24 hours) at 02/18/14 1150 Last data filed at 02/18/14 0447  Gross per 24 hour  Intake    240 ml  Output    500 ml  Net   -260 ml   Gen: A&O x 4, no acute distress, well developed, well nourished HEENT: Atraumatic, prosthetic L eye, EOMI, sclerae anicteric, moist mucous membranes Heart: sinus arrhythmia, normal S1 S2, no murmurs, rubs, or gallops Lungs: Clear to auscultation bilaterally, respirations unlabored Abd: Soft, non-tender, non-distended, + bowel sounds, no hepatosplenomegaly Ext: No edema or cyanosis  Lab Results: Basic Metabolic Panel:  Recent Labs Lab 02/17/14 1255 02/18/14 0138  NA 142 138  K 4.8 3.2*  CL 104 102  CO2 33* 25  GLUCOSE 120* 108*  BUN 12 15  CREATININE 1.08 1.35*  CALCIUM 9.9 9.1   CBC:  Recent Labs Lab 02/17/14 1255 02/18/14 0138  WBC 6.4 6.8  HGB 14.2 12.3  HCT 41.7 37.2  MCV 90.7 90.7  PLT 236 198   Cardiac Enzymes:  Recent Labs Lab 02/17/14 1938 02/18/14 0138  TROPONINI <0.03 <0.03   CBG:  Recent Labs Lab 02/17/14 2101 02/18/14 0743  GLUCAP 119* 78   Urine Drug Screen: Drugs of Abuse     Component Value Date/Time   LABOPIA NONE DETECTED 02/18/2014 0506   COCAINSCRNUR NONE DETECTED 02/18/2014 0506   LABBENZ POSITIVE* 02/18/2014 0506   AMPHETMU NONE DETECTED 02/18/2014 0506   THCU NONE DETECTED 02/18/2014 0506   LABBARB NONE DETECTED  02/18/2014 0506    Alcohol Level:  Recent Labs Lab 02/17/14 2000  [place] <5   Micro Results: No results found for this or any previous visit (from the past 240 hour(s)). Studies/Results: Dg Chest 2 View  02/17/2014   CLINICAL DATA:  Shortness of breath, tachycardia, bilateral chest tightness  EXAM: CHEST  2 VIEW  COMPARISON:  02/07/2014  FINDINGS: There is no focal parenchymal opacity, pleural effusion, or pneumothorax. The heart and mediastinal contours are unremarkable. There is thoracic aortic atherosclerosis.  The osseous structures are unremarkable.  IMPRESSION: No active cardiopulmonary disease.   Electronically Signed   By: Kathreen Devoid   On: 02/17/2014 14:07   Medications: I have reviewed the patient's current medications. Scheduled Meds: . ALPRAZolam  0.5 mg Oral Daily  . aspirin EC  81 mg Oral Daily  . carvedilol  3.125 mg Oral BID WC  . furosemide  20 mg Oral Daily  . gi cocktail  30 mL Oral Once  . heparin  5,000 Units Subcutaneous 3 times per day  . Influenza vac split quadrivalent PF  0.5 mL Intramuscular Tomorrow-1000  . latanoprost  1 drop Right Eye QHS  . lisinopril  40 mg Oral Daily  . pantoprazole  40 mg Oral Daily  .  simvastatin  40 mg Oral q morning - 10a  . sodium chloride  3 mL Intravenous Q12H   Continuous Infusions:  PRN Meds:.acetaminophen **OR** acetaminophen, labetalol Assessment/Plan: Active Problems:   HTN (hypertension)   DM2 (diabetes mellitus, type 2)   Chest tightness   Hypertensive urgency  #Atypical Chest Pain: Kayla [name] has atypical chest pain for six months. There seems to be an exertional component and she has risk factors of DM2, HTN, HL, smoking but the location is epigastric/bottom of sternum. EKG unchanged and trended troponin negative. TIMI score of 4 is 20 percent risk at 14 days of all cause mortality, new or recurrent MI, or severe recurrent ischemia requiring urgent revascularization. This could also be GI given history of reflux  and epigastric location and she says there was improvement with GI cocktail. Patient also reports there is an anxiety component. Spoke to cardiology who recommends out-patient stress test. -appreciate cards -out-patient cards f/u w stress test -cont home ASA 81 mg daily, xanax 0.5 mg daily -protonix 40 mg daily -tylenol 650 mg po or pr q6hprn -cardiac monitoring  #Hypertensive Urgency: Unclear why [name]'s blood pressure is elevated. There have been issues with medication compliance in the past as she admits to giving herself weekly breaks from medicines and previous clinic notes mention that she is taking her medications incorrectly. For example, taking bid medications once a day. She did not require any prn labetalol overnight. BP was 170s/70s this morning but elevated to 200/92 following ambulation. Asked RN to recheck after am medication administration and resting. At home she is on ASA 81 mg daily, carvedilol 3.125 bid, lisinopril 40 mg daily, lasix 20 mg daily, simvastatin 40 mg daily -cont ASA 81 mg daily, carvedilol 3.125 bid, lisinopril 40 mg daily, lasix 20 mg daily, simvastatin 40 mg daily -labetalol 5 mg iv q20min for SBP>190, DBP>100  #CHF: No echo on record. Cardiology note from Dr Daneen Schick notes 05/2008 Dr Melvern Banker echo w EF [id number], mild diastolic LV dysfunction, [id number] echo EF 55-60%. At home she is on ASA 81 mg daily, carvedilol 3.125 mg bid, lisinopril 40 mg daily, and lasix 20 mg daily -cont ASA 81 mg daily, carvedilol 3.125 mg bid, lisinopril 40 mg daily, and lasix 20 mg daily  #DM2: Last A1c 6.1 but from 09/2009. She is s/p L eye removal which in EMR is attributed to her diabetes but unclear as she said she woke up in pool of blood. She is not on any medications for DM2 and says she never had to use insulin -recheck hemoglobin A1c -cbg q4h and hold on SSI for now  #HL: Last lipid profile 02/2011 with total cholesterol 198, triglyceride 167, HDL 71, and LDL 94. She is on  simvastatin 40 mg daily at home. 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 Pt reports she has been in remission since. She says as a result of radiation she has some difficulty hearing with her L ear.  #Diet: HH  #DVT PPx: heparin 5000 u Barrett tid  #Code: full Dispo: Disposition is deferred at this time, awaiting improvement of current medical problems.  Anticipated discharge in approximately 0 day(s).   The patient does have a current PCP Ollen Gross Colette Ribas, MD) and does need an [facility] follow-up appointment after discharge.  The patient does not have transportation limitations that hinder transportation to clinic appointments.  .Services Needed at time of discharge: Y = Yes, Blank = No PT:  HH  OT:   RN:   Equipment:   Other:     LOS: 1 day   Kelby Aline, MD 02/18/2014, 11:50 AM

## 2014-02-18 NOTE — Evaluation (Signed)
Occupational Therapy Evaluation Patient Details Name: [name] MRN: [id number] DOB: 12/31/1932 Today's Date: 02/18/2014    History of Present Illness Pt is a 81 y.o. Female admitted 02/17/14 with c/o chest tightness for the past 6 months with worsening over  the last 2 weeks. Pt had a systolic BP in the 123456 at home and was advised to go to the ED by her [facility]. Pt was recently seen in ED on 02/07/14 for chest pain and HTN and d/c from ED to home. PMH: HTN, DM, HLD, anxiety, GERD.   Clinical Impression   PTA pt lived at home alone and was independent with ADLs. She currently requires Supervision for ADLs and is likely at or near baseline. No further acute OT needs.     Follow Up Recommendations  No OT follow up;Supervision/Assistance - 24 hour    Equipment Recommendations  None recommended by OT    Recommendations for Other Services       Precautions / Restrictions Restrictions Weight Bearing Restrictions: No      Mobility Bed Mobility Overal bed mobility: Modified Independent                Transfers Overall transfer level: Needs assistance Equipment used: None Transfers: Sit to/from Stand Sit to Stand: Supervision         General transfer comment: Supervision for safety         ADL Overall ADL's : Needs assistance/impaired                                       General ADL Comments: Pt required Supervision for ADLs and functional mobility in room. Pt reports she is close to her baseline.      Vision  Pt reports no change from baseline.                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           Pertinent Vitals/Pain Pain Assessment: No/denies pain     Hand Dominance Right   Extremity/Trunk Assessment Upper Extremity Assessment Upper Extremity Assessment: Overall WFL for tasks assessed   Lower Extremity Assessment Lower Extremity Assessment: Defer to PT evaluation   Cervical / Trunk  Assessment Cervical / Trunk Assessment: Normal   Communication Communication Communication: No difficulties   Cognition Arousal/Alertness: Awake/alert Behavior During Therapy: WFL for tasks assessed/performed Overall Cognitive Status: Within Functional Limits for tasks assessed                                Home Living Family/patient expects to be discharged to:: Private residence Living Arrangements: Alone Available Help at Discharge: Family;Available PRN/intermittently (granddaughter) Type of Home: House Home Access: Stairs to enter [employer] of Steps: 2 Entrance Stairs-Rails: None Home Layout: One level     Bathroom Shower/Tub: Teacher, early years/pre: Standard     Home Equipment: Shower seat          Prior Functioning/Environment Level of Independence: Independent        Comments: pt reports she used AD after her hip surgery but does not currently use AD. She reports she has a HHRN but she is independent with ADLs. She does not drive and takes the bus for her daily groceries but her granddaughter assists.     OT Diagnosis: Generalized  weakness;Acute pain    End of Session Equipment Utilized During Treatment: Gait belt  Activity Tolerance: Patient tolerated treatment well Patient left: in bed;with call bell/phone within reach;with family/visitor present   Time: [id number] OT Time Calculation (min): 11 min Charges:  OT General Charges $OT Visit: 1 Procedure OT Evaluation $Initial OT Evaluation Tier I: 1 Procedure G-Codes: OT G-codes **NOT FOR INPATIENT CLASS** Functional Assessment Tool Used: clinical judgment Functional Limitation: Self care Self Care Current Status [id number]): At least 1 percent but less than 20 percent impaired, limited or restricted Self Care Goal Status [id number]): At least 1 percent but less than 20 percent impaired, limited or restricted Self Care Discharge Status [phone]): At least 1 percent but less  than 20 percent impaired, limited or restricted  Juluis Rainier 02/18/2014, 9:01 AM   Cyndie Chime, OTR/L Occupational Therapist [phone] (pager)

## 2014-02-18 NOTE — Progress Notes (Signed)
Present BP at 170/80. Will update MD accordingly.

## 2014-02-18 NOTE — Care Management Note (Signed)
    Page 1 of 1   02/18/2014     10:29:26 AM CARE MANAGEMENT NOTE 02/18/2014  Patient:  [name], [name]   Account Number:  [id number]  Date Initiated:  02/18/2014  Documentation initiated by:  GRAVES-BIGELOW,Jahlani Lorentz  Subjective/Objective Assessment:   Pt admitted for cp. Pt is from home alone and has family support of grandson and granddaughter.     Action/Plan:   Pt will need HHPT services-MD will need to write orders for [facility] services. CM to make referral with [facility] and SOC to begin within 24-48 hrs post d/c.   Anticipated DC Date:  02/18/2014   Anticipated DC Plan:  [place]  CM consult      [facility] [place] Choice  HOME HEALTH   Choice offered to / List presented to:  C-1 Patient        [place] arranged  [place] PT      [place].   Status of service:  Completed, signed off Medicare Important Message given?  NO (If response is "NO", the following Medicare IM given date fields will be blank) Date Medicare IM given:   Medicare IM given by:   Date Additional Medicare IM given:   Additional Medicare IM given by:    Discharge Disposition:  [place]  Per UR Regulation:  Reviewed for med. necessity/level of care/duration of stay  If discussed at [place] of Stay Meetings, dates discussed:    Comments:

## 2014-02-18 NOTE — Evaluation (Addendum)
Physical Therapy Evaluation Patient Details Name: [name] MRN: [id number] DOB: 1932/06/14 Today's Date: 02/18/2014   History of Present Illness  Pt is a 81 y.o. Female admitted 02/17/14 with c/o chest tightness for the past 6 months with worsening over  the last 2 weeks. Pt had a systolic BP in the 123456 at home and was advised to go to the ED by her [facility]. Pt was recently seen in ED on 02/07/14 for chest pain and HTN and d/c from ED to home. PMH: HTN, DM, HLD, anxiety, GERD.  Clinical Impression  Pt admitted with above diagnosis. Pt currently with functional limitations due to the deficits listed below (see PT Problem List). Nursing notified of incr BP to 203/103 after walk.  Pt will benefit from HHPT safety evaluation.  Feel she is close to baseline but does report some weakness.  Will follow acutely as well.   Pt will benefit from skilled PT to increase their independence and safety with mobility to allow discharge to the venue listed below.     Follow Up Recommendations Home health PT;Supervision - Intermittent (safety eval)    Equipment Recommendations  None recommended by PT    Recommendations for Other Services       Precautions / Restrictions Precautions Precautions: Fall Restrictions Weight Bearing Restrictions: No      Mobility  Bed Mobility Overal bed mobility: Modified Independent                Transfers Overall transfer level: Independent                  Ambulation/Gait Ambulation/Gait assistance: Supervision Ambulation Distance (Feet): 250 Feet Assistive device: None Gait Pattern/deviations: Decreased stride length;Antalgic   Gait velocity interpretation: at or above normal speed for age/gender General Gait Details: Pt without significant LOB with min challenges to balance.  Pt feels she is a little weak but close to baseline.  Baseline gait antalgic however functional.    Stairs Stairs: Yes Stairs assistance: Supervision Stair Management:  No rails;Step to pattern;Forwards Number of Stairs: 4 General stair comments: Pt able to ascend and descend steps with cues and no assist.   Wheelchair Mobility    Modified Rankin (Stroke Patients Only)       Balance Overall balance assessment: Needs assistance Sitting-balance support: No upper extremity supported;Feet supported Sitting balance-Leahy Scale: Good     Standing balance support: No upper extremity supported;During functional activity Standing balance-Leahy Scale: Fair Standing balance comment: can stand statically without UE support.               High level balance activites: Direction changes;Turns;Sudden stops High Level Balance Comments: Pt can do all of the above with min guard assist without LOB.               Pertinent Vitals/Pain Pain Assessment: No/denies pain  BP at rest initially 176/91; BP once back from walk was 203/103.  BP once in bed in supine was 200/92.  Notified nursing who was getting BP meds.      Home Living Family/patient expects to be discharged to:: Private residence Living Arrangements: Alone Available Help at Discharge: Family;Available PRN/intermittently (granddaughter) Type of Home: House Home Access: Stairs to enter Entrance Stairs-Rails: None Entrance Stairs-Number of Steps: 2 Home Layout: One level Home Equipment: Shower seat      Prior Function Level of Independence: Independent         Comments: pt reports she used AD after her hip surgery but does not  currently use AD. She reports she has a HHRN but she is independent with ADLs. She does not drive and takes the bus for her daily groceries but her granddaughter assists.      Hand Dominance   Dominant Hand: Right    Extremity/Trunk Assessment   Upper Extremity Assessment: Defer to OT evaluation           Lower Extremity Assessment: Overall WFL for tasks assessed      Cervical / Trunk Assessment: Normal  Communication   Communication: No  difficulties  Cognition Arousal/Alertness: Awake/alert Behavior During Therapy: WFL for tasks assessed/performed Overall Cognitive Status: Within Functional Limits for tasks assessed                      General Comments      Exercises        Assessment/Plan    PT Assessment Patient needs continued PT services  PT Diagnosis Generalized weakness   PT Problem List Decreased activity tolerance;Decreased balance;Decreased mobility;Decreased safety awareness;Decreased knowledge of use of DME;Decreased knowledge of precautions  PT Treatment Interventions Gait training;DME instruction;Stair training;Functional mobility training;Therapeutic activities;Therapeutic exercise;Balance training;Patient/family education   PT Goals (Current goals can be found in the Care Plan section) Acute Rehab PT Goals Patient Stated Goal: to go home PT Goal Formulation: With patient Time For Goal Achievement: 02/25/14 Potential to Achieve Goals: Good    Frequency Min 3X/week   Barriers to discharge        Co-evaluation               End of Session Equipment Utilized During Treatment: Gait belt Activity Tolerance: Patient limited by fatigue Patient left: with call bell/phone within reach;in bed;with family/visitor present Nurse Communication: Mobility status (incr BP after walk.)    Functional Assessment Tool Used: clinicall judgment Functional Limitation: Mobility: Walking and moving around Mobility: Walking and Moving Around Current Status [id number]): At least 1 percent but less than 20 percent impaired, limited or restricted Mobility: Walking and Moving Around Goal Status [phone]): 0 percent impaired, limited or restricted    Time: 0949-1006 PT Time Calculation (min) (ACUTE ONLY): 17 min   Charges:   PT Evaluation $Initial PT Evaluation Tier I: 1 Procedure     PT G Codes:   PT G-Codes **NOT FOR INPATIENT CLASS** Functional Assessment Tool Used: clinicall judgment Functional  Limitation: Mobility: Walking and moving around Mobility: Walking and Moving Around Current Status [id number]): At least 1 percent but less than 20 percent impaired, limited or restricted Mobility: Walking and Moving Around Goal Status [phone]): 0 percent impaired, limited or restricted    Denice Paradise 02/18/2014, 1:36 PM Xenia Daveena Elmore,PT Acute Rehabilitation [phone] (pager)

## 2014-02-18 NOTE — Discharge Instructions (Signed)
It was a pleasure to care for you at [facility]. We have not started any new medications but encourage you to keep using your medicines as directed. Please call Dr Kennon Holter to schedule a follow-up within one week as well as your cardiologist office (they should be contacting you). Please return to the Emergency Department or seek medical attention if you have any new or worsening chest pain, trouble breathing, high blood pressure, or other worrisome medical condition.   Lottie Mussel, MD

## 2014-02-20 LAB — HEMOGLOBIN A1C
HEMOGLOBIN A1C: 5.8 % — AB (ref 4.8–5.6)
Mean Plasma Glucose: 120 mg/dL

## 2014-02-28 ENCOUNTER — Ambulatory Visit (HOSPITAL_COMMUNITY): Payer: Medicare Other | Attending: Cardiology | Admitting: Radiology

## 2014-02-28 DIAGNOSIS — R0602 Shortness of breath: Secondary | ICD-10-CM

## 2014-02-28 DIAGNOSIS — R0789 Other chest pain: Secondary | ICD-10-CM

## 2014-02-28 DIAGNOSIS — R072 Precordial pain: Secondary | ICD-10-CM | POA: Diagnosis not present

## 2014-02-28 DIAGNOSIS — R079 Chest pain, unspecified: Secondary | ICD-10-CM

## 2014-02-28 MED ORDER — REGADENOSON 0.4 MG/5ML IV SOLN
0.4000 mg | Freq: Once | INTRAVENOUS | Status: AC
  Administered 2014-02-28: 0.4 mg via INTRAVENOUS

## 2014-02-28 MED ORDER — TECHNETIUM TC 99M SESTAMIBI GENERIC - CARDIOLITE
33.0000 | Freq: Once | INTRAVENOUS | Status: AC | PRN
  Administered 2014-02-28: 33 via INTRAVENOUS

## 2014-02-28 MED ORDER — TECHNETIUM TC 99M SESTAMIBI GENERIC - CARDIOLITE
11.0000 | Freq: Once | INTRAVENOUS | Status: AC | PRN
  Administered 2014-02-28: 11 via INTRAVENOUS

## 2014-02-28 NOTE — Progress Notes (Signed)
[facility] [address] [place], [place] [address] [phone]    Cardiology Nuclear Med Study  [name] is a 81 y.o. female     MRN : [id number]     DOB: [birth year]  Procedure Date: 02/28/2014  Nuclear Med Background Indication for Stress Test:  Evaluation for Ischemia, and Patient seen in hospital on 02-18-2014 for Chest Pain, Hypertension, Enzymes negative History:  '08 MPI: EF: 70% NL  2000 Seizure x1 Cardiac Risk Factors: Hypertension and NIDDM  Symptoms:  Chest Pain, DOE, Fatigue, Palpitations and SOB   Nuclear Pre-Procedure Caffeine/Decaff Intake:  None> 24 hrs NPO After: 8:00pm   Lungs:  clear O2 Sat: 97% on room air. IV 0.9% NS with Angio Cath:  24g  IV Site: R Hand x 1, tolerated well IV Started by:  Irven Baltimore, RN  Chest Size (in):  36 Cup Size: D  Height: 5' (1.524 m)  Weight:  135 lb (61.236 kg)  BMI:  Body mass index is 26.37 kg/(m^2). Tech Comments:  Patient took Coreg this am. Irven Baltimore, RN.    Nuclear Med Study 1 or 2 day study: 1 day  Stress Test Type:  Carlton Adam  Reading MD: N/A  Order Authorizing Provider:  Daneen Schick, III, MD, and Murray Hodgkins, NP  Resting Radionuclide: Technetium 57m Sestamibi  Resting Radionuclide Dose: 11.0 mCi   Stress Radionuclide:  Technetium 68m Sestamibi  Stress Radionuclide Dose: 33.0 mCi           Stress Protocol Rest HR: 75 Stress HR: 92  Rest BP: 155/84 Stress BP: 160/92  Exercise Time (min): n/a METS: n/a   Predicted Max HR: 141 bpm % Max HR: 65.25 bpm Rate Pressure Product: 14720   Dose of Adenosine (mg):  n/a Dose of Lexiscan: 0.4 mg  Dose of Atropine (mg): n/a Dose of Dobutamine: n/a mcg/kg/min (at max HR)  Stress Test Technologist: Perrin Maltese, EMT-P  Nuclear Technologist:  Earl Many, CNMT     Rest Procedure:  Myocardial perfusion imaging was performed at rest 45 minutes following the intravenous administration of Technetium 59m Sestamibi. Rest  ECG: NSR with non-specific ST-T wave changes  Stress Procedure:  The patient received IV Lexiscan 0.4 mg over 15-seconds.  Technetium 58m Sestamibi injected at 30-seconds. This patient had sob and fatigue with the Lexiscan injection. Quantitative spect images were obtained after a 45 minute delay. Stress ECG: No significant change from baseline ECG  QPS Raw Data Images:  Normal; no motion artifact; normal heart/lung ratio. Stress Images:  There is decreased uptake in the lateral wall. Rest Images:  There is decreased uptake in the lateral wall. Subtraction (SDS):  Findings are consistent with small defect of moderate severity involving the mid and basilar inferolateral  Segments.  There is partial reversibility.  Transient Ischemic Dilatation (Normal <1.22):  1.00 Lung/Heart Ratio (Normal <0.45):  0.28  Quantitative Gated Spect Images QGS EDV:  77 ml QGS ESV:  36 ml  Impression Exercise Capacity:  Lexiscan with no exercise. BP Response:  Normal blood pressure response. Clinical Symptoms:  No chest pain. ECG Impression:  No significant ECG changes with Lexiscan. Comparison with Prior Nuclear Study: Since the prior myoview done at [facility] in 2008 the ejection fraction has dropped from 70% to 53% and there is now evidence of small inferolateral scar with minimal peri-infarct ischemia. SDS=4  Overall Impression:  Low risk stress nuclear study. There is  evidence of small inferolateral scar with minimal peri-infarct ischemia.  SDS=4, new since 2008. Normal LV systolic function.  LV Ejection Fraction: 53%.  LV Wall Motion:  NL LV Function; NL Wall Motion  Darlin Coco MD

## 2014-03-07 ENCOUNTER — Telehealth: Payer: Self-pay | Admitting: Interventional Cardiology

## 2014-03-07 NOTE — Telephone Encounter (Signed)
pt aware of myoview results.Low risk study. F/u with Dr. Tamala Julian as planned.pt verbalized understanding.

## 2014-03-07 NOTE — Telephone Encounter (Signed)
New message     Patient calling back to speak with nurse.

## 2014-03-17 ENCOUNTER — Ambulatory Visit (INDEPENDENT_AMBULATORY_CARE_PROVIDER_SITE_OTHER): Payer: Medicare Other | Admitting: Interventional Cardiology

## 2014-03-17 ENCOUNTER — Encounter: Payer: Self-pay | Admitting: Interventional Cardiology

## 2014-03-17 VITALS — BP 152/98 | HR 66 | Ht 60.0 in | Wt 141.8 lb

## 2014-03-17 DIAGNOSIS — E785 Hyperlipidemia, unspecified: Secondary | ICD-10-CM

## 2014-03-17 DIAGNOSIS — I10 Essential (primary) hypertension: Secondary | ICD-10-CM

## 2014-03-17 DIAGNOSIS — I5032 Chronic diastolic (congestive) heart failure: Secondary | ICD-10-CM

## 2014-03-17 DIAGNOSIS — R0789 Other chest pain: Secondary | ICD-10-CM

## 2014-03-17 NOTE — Patient Instructions (Signed)
Your physician recommends that you continue on your current medications as directed. Please refer to the Current Medication list given to you today.  Your physician wants you to follow-up in: 1 year with Dr.Smith You will receive a reminder letter in the mail two months in advance. If you don't receive a letter, please call our office to schedule the follow-up appointment.  

## 2014-03-17 NOTE — Progress Notes (Signed)
Cardiology Office Note   Date:  03/17/2014   ID:  [name], DOB [birth year], MRN [id number]  PCP:  Elizabeth Palau, MD  Cardiologist:   Sinclair Grooms, MD   No chief complaint on file.     History of Present Illness: [name] is a 81 y.o. female who presents for follow-up of dyspnea and chest discomfort. February 2016 hospitalization for these complaints led to a negative rule out for myocardial infarction. An outpatient myocardial perfusion study was low risk. LV function was noted to be normal. Refill she has chronic diastolic heart failure and is on low-dose beta blocker and diuretic therapy. There is some renal impairment with a creatinine of 1.35. She has not had lightheadedness or dizziness. She denies edema. She has a discomfort that wraps around the lower margins of her rib cage with activity and goes away with rest.    Past Medical History  Diagnosis Date  . Anemia 04/03/2011  . Hypertension   . High cholesterol   . Type II diabetes mellitus     not taking any medication  . Anginal pain   . Exertional dyspnea   . GERD (gastroesophageal reflux disease)   . 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 Depression   . 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 Blind left eye 1999  . Hip fracture 07/17/11    fall from 1-2 feet; left    Past Surgical History  Procedure Laterality Date  . Tubal ligation  1970's  . Tongue biopsy  1998    "cancer"  . Intraocular prostheses insertion  1999    left  . Hip arthroplasty  07/18/2011    Procedure: ARTHROPLASTY BIPOLAR HIP;  Surgeon: Nita Sells, MD;  Location: [facility];  Service: Orthopedics;  Laterality: Left;     Current Outpatient Prescriptions  Medication Sig Dispense Refill  . ALPRAZolam (XANAX) 0.5 MG tablet Take 0.5 mg by mouth daily.    Marland Kitchen aspirin 81 MG tablet Take 81 mg by mouth daily.    . carvedilol (COREG) 3.125 MG tablet Take 1 tablet (3.125 mg total) by mouth 2 (two)  times daily with a meal. 60 tablet 11  . furosemide (LASIX) 20 MG tablet take 1 tablet by mouth once daily 30 tablet 11  . lisinopril (PRINIVIL,ZESTRIL) 20 MG tablet Take 40 mg by mouth daily.     . Multiple Vitamin (MULTIVITAMIN WITH MINERALS) TABS tablet Take 1 tablet by mouth daily.    Marland Kitchen omeprazole (PRILOSEC) 20 MG capsule Take 20 mg by mouth daily.     Marland Kitchen pyridOXINE (VITAMIN B-6) 100 MG tablet Take 100 mg by mouth daily.    . simvastatin (ZOCOR) 40 MG tablet Take 40 mg by mouth every morning.     . TRAVATAN Z 0.004 % SOLN ophthalmic solution Place 1 drop into the right eye at bedtime.      No current facility-administered medications for this visit.    Allergies:   Review of patient's allergies indicates no known allergies.    Social History:  The patient  reports that she quit smoking about 16 years ago. Her smoking use included Cigarettes. She has a 70.5 pack-year smoking history. She has never used smokeless tobacco. She reports that she drinks about 10.2 oz of alcohol per week. She reports that she does not use illicit drugs.   Family History:  The patient's family history includes Diabetes type II in her father,  mother, and sister. There is no history of Colon cancer.    ROS:  Please see the history of present illness.   Otherwise, review of systems are positive for here without family.   All other systems are reviewed and negative.    PHYSICAL EXAM: VS:  BP 152/98 mmHg  Pulse 66  Ht 5' (1.524 m)  Wt 141 lb 12.8 oz (64.32 kg)  BMI 27.69 kg/m2 , BMI Body mass index is 27.69 kg/(m^2). GEN: Well nourished, well developed, in no acute distress HEENT: normal Neck: no JVD, carotid bruits, or masses Cardiac: RRR; no murmurs, rubs, or gallops,no edema  Respiratory:  clear to auscultation bilaterally, normal work of breathing GI: soft, nontender, nondistended, + BS MS: no deformity or atrophy Skin: warm and dry, no rash Neuro:  Strength and sensation are intact Psych: euthymic  mood, full affect   EKG:  EKG is not ordered today.    Recent Labs: 02/17/2014: B Natriuretic Peptide 322.7* 02/18/2014: BUN 15; Creatinine 1.35*; Hemoglobin 12.3; Platelets 198; Potassium 3.2*; Sodium 138    Lipid Panel    Component Value Date/Time   CHOL 198 02/28/2011 1030   TRIG 167* 02/28/2011 1030   HDL 71 02/28/2011 1030   CHOLHDL 2.8 02/28/2011 1030   VLDL 33 02/28/2011 1030   LDLCALC 94 02/28/2011 1030      Wt Readings from Last 3 Encounters:  03/17/14 141 lb 12.8 oz (64.32 kg)  02/28/14 135 lb (61.236 kg)  02/17/14 131 lb 1.6 oz (59.467 kg)      Other studies Reviewed: Additional studies/ records that were reviewed today include: reviewed hospital records from the February 2016 admission.. Review of the above records demonstrates: No significant findings to help with today's assessment.   ASSESSMENT AND PLAN:  Chronic diastolic heart failure: No evidence of volume overload  Chest discomfort: Explanation is uncertain. Very recent myocardial perfusion study was low risk/essentially normal  Essential hypertension: Elevated blood pressure. Renal insufficiency. Upcoming evaluation by nephrology.  Hyperlipidemia    Current medicines are reviewed at length with the patient today.  The patient does not have concerns regarding medicines. Continue the current medical regimen as listed  The following changes have been made:  no change: Consider evaluation of pulmonary status.  Labs/ tests ordered today include: None  No orders of the defined types were placed in this encounter.     Disposition:   FU with Linard Millers in 1 year   Signed, Sinclair Grooms, MD  03/17/2014 12:03 PM    [address], [place], [place]  [address] Phone: [phone]; Fax: [phone]

## 2014-06-16 ENCOUNTER — Other Ambulatory Visit: Payer: Self-pay | Admitting: *Deleted

## 2014-06-16 MED ORDER — FUROSEMIDE 20 MG PO TABS: ORAL_TABLET | ORAL | Status: DC

## 2014-07-05 ENCOUNTER — Other Ambulatory Visit: Payer: Self-pay | Admitting: Family Medicine

## 2014-07-05 DIAGNOSIS — R1900 Intra-abdominal and pelvic swelling, mass and lump, unspecified site: Secondary | ICD-10-CM

## 2014-07-10 ENCOUNTER — Ambulatory Visit
Admission: RE | Admit: 2014-07-10 | Discharge: 2014-07-10 | Disposition: A | Discharge status: Home / Self Care | Payer: Medicare Other | Source: Ambulatory Visit | Attending: Family Medicine | Admitting: Family Medicine

## 2014-07-10 ENCOUNTER — Other Ambulatory Visit: Payer: Self-pay | Admitting: Family Medicine

## 2014-07-10 DIAGNOSIS — R1900 Intra-abdominal and pelvic swelling, mass and lump, unspecified site: Secondary | ICD-10-CM

## 2014-10-23 ENCOUNTER — Emergency Department (HOSPITAL_COMMUNITY): Payer: Medicare Other

## 2014-10-23 ENCOUNTER — Emergency Department (HOSPITAL_COMMUNITY)
Admission: EM | Admit: 2014-10-23 | Discharge: 2014-10-23 | Disposition: A | Discharge status: Home / Self Care | Payer: Medicare Other | Attending: Emergency Medicine | Admitting: Emergency Medicine

## 2014-10-23 ENCOUNTER — Encounter (HOSPITAL_COMMUNITY): Payer: Self-pay | Admitting: Emergency Medicine

## 2014-10-23 DIAGNOSIS — E119 Type 2 diabetes mellitus without complications: Secondary | ICD-10-CM | POA: Diagnosis not present

## 2014-10-23 DIAGNOSIS — E782 Mixed hyperlipidemia: Secondary | ICD-10-CM | POA: Insufficient documentation

## 2014-10-23 DIAGNOSIS — I471 Supraventricular tachycardia: Secondary | ICD-10-CM | POA: Insufficient documentation

## 2014-10-23 DIAGNOSIS — R011 Cardiac murmur, unspecified: Secondary | ICD-10-CM | POA: Diagnosis not present

## 2014-10-23 DIAGNOSIS — Z7982 Long term (current) use of aspirin: Secondary | ICD-10-CM | POA: Diagnosis not present

## 2014-10-23 DIAGNOSIS — I10 Essential (primary) hypertension: Secondary | ICD-10-CM | POA: Diagnosis not present

## 2014-10-23 DIAGNOSIS — Z87891 Personal history of nicotine dependence: Secondary | ICD-10-CM | POA: Diagnosis not present

## 2014-10-23 DIAGNOSIS — F329 Major depressive disorder, single episode, unspecified: Secondary | ICD-10-CM | POA: Insufficient documentation

## 2014-10-23 DIAGNOSIS — I5032 Chronic diastolic (congestive) heart failure: Secondary | ICD-10-CM | POA: Diagnosis not present

## 2014-10-23 DIAGNOSIS — H5442 Blindness, left eye, normal vision right eye: Secondary | ICD-10-CM | POA: Diagnosis not present

## 2014-10-23 DIAGNOSIS — I209 Angina pectoris, unspecified: Secondary | ICD-10-CM | POA: Insufficient documentation

## 2014-10-23 DIAGNOSIS — K219 Gastro-esophageal reflux disease without esophagitis: Secondary | ICD-10-CM | POA: Diagnosis not present

## 2014-10-23 DIAGNOSIS — R0789 Other chest pain: Secondary | ICD-10-CM

## 2014-10-23 DIAGNOSIS — Z79899 Other long term (current) drug therapy: Secondary | ICD-10-CM | POA: Insufficient documentation

## 2014-10-23 DIAGNOSIS — R079 Chest pain, unspecified: Secondary | ICD-10-CM | POA: Diagnosis present

## 2014-10-23 DIAGNOSIS — Z862 Personal history of diseases of the blood and blood-forming organs and certain disorders involving the immune mechanism: Secondary | ICD-10-CM | POA: Insufficient documentation

## 2014-10-23 LAB — BASIC METABOLIC PANEL
ANION GAP: 5 (ref 5–15)
BUN: 18 mg/dL (ref 6–20)
CO2: 23 mmol/L (ref 22–32)
Calcium: 7.3 mg/dL — ABNORMAL LOW (ref 8.9–10.3)
Chloride: 106 mmol/L (ref 101–111)
Creatinine, Ser: 0.97 mg/dL (ref 0.44–1.00)
GFR calc Af Amer: >60 mL/min (ref >60)
GFR calc non Af Amer: 54 mL/min — ABNORMAL LOW (ref >60)
GLUCOSE: 80 mg/dL (ref 65–99)
POTASSIUM: 3.2 mmol/L — AB (ref 3.5–5.1)
Sodium: 134 mmol/L — ABNORMAL LOW (ref 135–145)

## 2014-10-23 LAB — I-STAT TROPONIN, ED
TROPONIN I, POC: 0.02 ng/mL (ref 0.00–0.08)
Troponin i, poc: 0.02 ng/mL (ref 0.00–0.08)

## 2014-10-23 LAB — CBC
HEMATOCRIT: 29.8 % — AB (ref 36.0–46.0)
HEMOGLOBIN: 10.7 g/dL — AB (ref 12.0–15.0)
MCH: 31.8 pg (ref 26.0–34.0)
MCHC: 35.9 g/dL (ref 30.0–36.0)
MCV: 88.7 fL (ref 78.0–100.0)
Platelets: 104 10*3/uL — ABNORMAL LOW (ref 150–400)
RBC: 3.36 MIL/uL — ABNORMAL LOW (ref 3.87–5.11)
RDW: 13.5 % (ref 11.5–15.5)
WBC: 5 10*3/uL (ref 4.0–10.5)

## 2014-10-23 LAB — I-STAT CG4 LACTIC ACID, ED: Lactic Acid, Venous: 0.55 mmol/L (ref 0.5–2.0)

## 2014-10-23 LAB — I-STAT CHEM 8, ED
BUN: 28 mg/dL — AB (ref 6–20)
CREATININE: 1.4 mg/dL — AB (ref 0.44–1.00)
Calcium, Ion: 1.04 mmol/L — ABNORMAL LOW (ref 1.13–1.30)
Chloride: 91 mmol/L — ABNORMAL LOW (ref 101–111)
GLUCOSE: 133 mg/dL — AB (ref 65–99)
HEMATOCRIT: 45 % (ref 36.0–46.0)
Hemoglobin: 15.3 g/dL — ABNORMAL HIGH (ref 12.0–15.0)
Potassium: 3.4 mmol/L — ABNORMAL LOW (ref 3.5–5.1)
Sodium: 124 mmol/L — ABNORMAL LOW (ref 135–145)
TCO2: 22 mmol/L (ref 0–100)

## 2014-10-23 LAB — D-DIMER, QUANTITATIVE: D-Dimer, Quant: 0.65 ug/mL-FEU — ABNORMAL HIGH (ref 0.00–0.48)

## 2014-10-23 LAB — PROTIME-INR
INR: 1.19 (ref 0.00–1.49)
Prothrombin Time: 15.2 seconds (ref 11.6–15.2)

## 2014-10-23 MED ORDER — SODIUM CHLORIDE 0.9 % IV BOLUS (SEPSIS)
1000.0000 mL | Freq: Once | INTRAVENOUS | Status: AC
  Administered 2014-10-23: 1000 mL via INTRAVENOUS

## 2014-10-23 MED ORDER — ASPIRIN 81 MG PO CHEW: 324.0000 mg | CHEWABLE_TABLET | Freq: Once | ORAL | Status: DC

## 2014-10-23 MED ORDER — CARVEDILOL 6.25 MG PO TABS: 6.2500 mg | ORAL_TABLET | Freq: Two times a day (BID) | ORAL | Status: DC

## 2014-10-23 MED ORDER — POTASSIUM CHLORIDE CRYS ER 20 MEQ PO TBCR
40.0000 meq | EXTENDED_RELEASE_TABLET | Freq: Once | ORAL | Status: AC
  Administered 2014-10-23: 40 meq via ORAL
  Filled 2014-10-23: qty 2

## 2014-10-23 MED ORDER — METOPROLOL TARTRATE 1 MG/ML IV SOLN
5.0000 mg | Freq: Once | INTRAVENOUS | Status: AC
  Administered 2014-10-23: 5 mg via INTRAVENOUS
  Filled 2014-10-23: qty 5

## 2014-10-23 NOTE — Discharge Instructions (Signed)
Paroxysmal Supraventricular Tachycardia Paroxysmal supraventricular tachycardia (PSVT) is when your heart beats very quickly and then suddenly stops beating so quickly. You may or may not have any symptoms when this occurs. It is usually not dangerous. It can lead to problems if it happens often or it lasts a long time. HOME CARE   Take medicines only as told by your doctor.  Avoid caffeine or limit how much of it you consume as told by your doctor. Caffeine is found in coffee, tea, soda, and chocolate.  Avoid alcohol or limit how much of it you drink as told by your doctor.  Do not smoke.  Try to get at least 7 hours of sleep each night.  Find healthy ways to reduce stress.  Do self-treatments as told by your doctor to slow down your heart (vagus nerve stimulation). Your doctor may tell you to:  Hold your breath and push, as though you are going to the bathroom.  Rub an area on one side of your neck.  Bend forward with your head between your legs.  Bend forward with your head between your legs, then cough.  Rub your eyeballs with your eyes closed.  Maintain a healthy weight.  Get some exercise on most days. Ask your doctor about some good activities for you. GET HELP IF:  You are having episodes of a fast heartbeat more often.  Your episodes are lasting longer.  Your self-treatments to slow down your heart are no longer helping.  You have new symptoms during an episode. GET HELP RIGHT AWAY IF:  You have chest pain.  You have trouble breathing.  You have an episode of a fast heartbeat that lasts longer than 20 minutes.  You pass out (faint). These symptoms may be an emergency. Do not wait to see if the symptoms will go away. Get medical help right away. Call your local emergency services (911 in the [place] Do not drive yourself to the hospital.   This information is not intended to replace advice given to you by your health care provider. Make sure you discuss  any questions you have with your health care provider.   Document Released: 12/30/2004 Document Revised: 01/20/2014 Document Reviewed: 06/09/2013 Elsevier Interactive Patient Education 2016 Elsevier Inc[employer] Nonspecific Chest Pain It is often hard to find the cause of chest pain. There is always a chance that your pain could be related to something serious, such as a heart attack or a blood clot in your lungs. Chest pain can also be caused by conditions that are not life-threatening. If you have chest pain, it is very important to follow up with your doctor.  HOME CARE  If you were prescribed an antibiotic medicine, finish it all even if you start to feel better.  Avoid any activities that cause chest pain.  Do not use any tobacco products, including cigarettes, chewing tobacco, or electronic cigarettes. If you need help quitting, ask your doctor.  Do not drink alcohol.  Take medicines only as told by your doctor.  Keep all follow-up visits as told by your doctor. This is important. This includes any further testing if your chest pain does not go away.  Your doctor may tell you to keep your head raised (elevated) while you sleep.  Make lifestyle changes as told by your doctor. These may include:  Getting regular exercise. Ask your doctor to suggest some activities that are safe for you.  Eating a heart-healthy diet. Your doctor or a diet specialist (dietitian)  can help you to learn healthy eating options.  Maintaining a healthy weight.  Managing diabetes, if necessary.  Reducing stress. GET HELP IF:  Your chest pain does not go away, even after treatment.  You have a rash with blisters on your chest.  You have a fever. GET HELP RIGHT AWAY IF:  Your chest pain is worse.  You have an increasing cough, or you cough up blood.  You have severe belly (abdominal) pain.  You feel extremely weak.  You pass out (faint).  You have chills.  You have sudden, unexplained chest  discomfort.  You have sudden, unexplained discomfort in your arms, back, neck, or jaw.  You have shortness of breath at any time.  You suddenly start to sweat, or your skin gets clammy.  You feel nauseous.  You vomit.  You suddenly feel light-headed or dizzy.  Your heart begins to beat quickly, or it feels like it is skipping beats. These symptoms may be an emergency. Do not wait to see if the symptoms will go away. Get medical help right away. Call your local emergency services (911 in the [place] Do not drive yourself to the hospital.   This information is not intended to replace advice given to you by your health care provider. Make sure you discuss any questions you have with your health care provider.   Document Released: 06/18/2007 Document Revised: 01/20/2014 Document Reviewed: 08/05/2013 Elsevier Interactive Patient Education [employer].

## 2014-10-23 NOTE — ED Notes (Signed)
Pt to ED via [facility] from home with complaint of burning central chest pain without radiation x1 week, reports generalized weakness without any other symptoms including sob or nausea/vomiting. Pt is a/o x4. Hx of HTN and high cholesterol. Pt in [place] @ 145 on arrival to ER. VS otherwise stable.

## 2014-10-23 NOTE — ED Provider Notes (Signed)
CSN: [id number]     Arrival date & time 10/23/14  1032 History   First MD Initiated Contact with Patient 10/23/14 1036     Chief Complaint  Patient presents with  . Chest Pain    Patient is a 81 y.o. female presenting with chest pain.  Chest Pain Pain location:  Substernal area Pain quality: pressure   Pain radiates to:  Does not radiate Pain severity:  Moderate Onset quality:  Sudden Timing:  Intermittent Progression:  Unchanged Chronicity:  New Context: at rest   Relieved by:  Nothing Associated symptoms: anorexia, anxiety, cough and weakness   Associated symptoms: no abdominal pain, no back pain, no diaphoresis, no fever, no lower extremity edema, no nausea and not vomiting     Past Medical History  Diagnosis Date  . Anemia 04/03/2011  . Hypertension   . High cholesterol   . Type II diabetes mellitus ([facility])     not taking any medication  . Anginal pain ([facility])   . Exertional dyspnea   . GERD (gastroesophageal reflux disease)   . Tongue cancer ([facility]) 1998    S/P radiation  . Depression   . 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 Blind left eye 1999  . Hip fracture ([facility]) 07/17/11    fall from 1-2 feet; left   Past Surgical History  Procedure Laterality Date  . Tubal ligation  1970's  . Tongue biopsy  1998    "cancer"  . Intraocular prostheses insertion  1999    left  . Hip arthroplasty  07/18/2011    Procedure: ARTHROPLASTY BIPOLAR HIP;  Surgeon: Nita Sells, MD;  Location: [facility];  Service: Orthopedics;  Laterality: Left;   Family History  Problem Relation Age of Onset  . Diabetes type II Mother   . Diabetes type II Father   . Diabetes type II Sister   . Colon cancer Neg Hx    Social History  Substance Use Topics  . Smoking status: Former Smoker -- 1.50 packs/day for 47 years    Types: Cigarettes    Quit date: 09/13/1997  . Smokeless tobacco: Never Used  . Alcohol Use: 10.2 oz/week    17 Shots of liquor per week     Comment:  07/17/11 "1/5 rum q weekend"   OB History    No data available     Review of Systems  Constitutional: Negative for fever and diaphoresis.  Respiratory: Positive for cough.   Cardiovascular: Positive for chest pain.  Gastrointestinal: Positive for anorexia. Negative for nausea, vomiting, abdominal pain, blood in stool and anal bleeding.  Genitourinary: Negative for hematuria.  Musculoskeletal: Negative for back pain.  Neurological: Positive for weakness.  All other systems reviewed and are negative.     Allergies  Review of patient's allergies indicates no known allergies.  Home Medications   Prior to Admission medications   Medication Sig Start Date End Date Taking? Authorizing Provider  ALPRAZolam Duanne Moron) 0.5 MG tablet Take 0.5 mg by mouth daily.    Historical Provider, MD  aspirin 81 MG tablet Take 81 mg by mouth daily.    Historical Provider, MD  carvedilol (COREG) 3.125 MG tablet Take 1 tablet (3.125 mg total) by mouth 2 (two) times daily with a meal. 03/15/13   Belva Crome, MD  furosemide (LASIX) 20 MG tablet take 1 tablet by mouth once daily 06/16/14   Burtis Junes, NP  lisinopril (PRINIVIL,ZESTRIL) 20 MG tablet Take 40 mg  by mouth daily.     Historical Provider, MD  Multiple Vitamin (MULTIVITAMIN WITH MINERALS) TABS tablet Take 1 tablet by mouth daily.    Historical Provider, MD  omeprazole (PRILOSEC) 20 MG capsule Take 20 mg by mouth daily.     Historical Provider, MD  pyridOXINE (VITAMIN B-6) 100 MG tablet Take 100 mg by mouth daily.    Historical Provider, MD  simvastatin (ZOCOR) 40 MG tablet Take 40 mg by mouth every morning.     Historical Provider, MD  TRAVATAN Z 0.004 % SOLN ophthalmic solution Place 1 drop into the right eye at bedtime.  11/24/12   Historical Provider, MD   BP 153/100 mmHg  Pulse 138  Temp(Src) 98.2 F (36.8 C) (Oral)  Resp 17  SpO2 100% Physical Exam  Constitutional: She is oriented to person, place, and time. She appears well-developed and  well-nourished. No distress.  HENT:  Head: Normocephalic and atraumatic.  Dry oropharynx  Eyes: Pupils are equal, round, and reactive to light.  Neck: Normal range of motion.  Cardiovascular:  Murmur heard. Tachycardic with diastolic murmur  Pulmonary/Chest: Effort normal and breath sounds normal. No respiratory distress. She has no wheezes. She has no rales. She exhibits no tenderness.  Abdominal: Soft. She exhibits no distension. There is no tenderness. There is no rebound and no guarding.  Musculoskeletal: Normal range of motion. She exhibits no edema.  Neurological: She is alert and oriented to person, place, and time. No cranial nerve deficit. She exhibits normal muscle tone.  Skin: Skin is warm and dry. She is not diaphoretic. No erythema.  Psychiatric: She has a normal mood and affect. Her behavior is normal. Judgment and thought content normal.  Nursing note and vitals reviewed.   ED Course  Procedures (including critical care time)  Emergency Ultrasound:  US Guidance for needle guidance  Performed by Dr. Martyn Ehrich Indication: IV access  Linear probe used in real-time to visualize location of needle entry through skin. Interpretation: Left Forearm 18 gauge Image archived electronically.   Labs Review Labs Reviewed  BASIC METABOLIC PANEL - Abnormal; Notable for the following:    Sodium 134 (*)    Potassium 3.2 (*)    Calcium 7.3 (*)    GFR calc non Af Amer 54 (*)    All other components within normal limits  CBC - Abnormal; Notable for the following:    RBC 3.36 (*)    Hemoglobin 10.7 (*)    HCT 29.8 (*)    All other components within normal limits  D-DIMER, QUANTITATIVE (NOT AT [facility]) - Abnormal; Notable for the following:    D-Dimer, Quant 0.65 (*)    All other components within normal limits  I-STAT CHEM 8, ED - Abnormal; Notable for the following:    Sodium 124 (*)    Potassium 3.4 (*)    Chloride 91 (*)    BUN 28 (*)    Creatinine, Ser 1.40 (*)    Glucose,  Bld 133 (*)    Calcium, Ion 1.04 (*)    Hemoglobin 15.3 (*)    All other components within normal limits  PROTIME-INR  I-STAT TROPOININ, ED  I-STAT CG4 LACTIC ACID, ED  I-STAT TROPOININ, ED  I-STAT TROPOININ, ED  I-STAT CG4 LACTIC ACID, ED    Imaging Review No results found. I have personally reviewed and evaluated these images and lab results as part of my medical decision-making.   EKG Interpretation None      MDM   Pateint presents  to the ED with intermittent, non exertional left sided chest pressure, SOB and palpitations. Patient states that these come and go without warning. She has history of cancer, Well's criteria of 4. She looks clinically dehydrated on exam and endorses decreased PO intake. Patient found to have hyponatremia more than likely due to volume depletion. Initial troponin unremarkable. Patient had nuclear perfusion scan in February. Denies any blood loss.   D-Dimer is negative (age adjusted). Tachycardia resolved when nurse attempted IV and went back into SVT. Modified valsalva performed with resolution of tachycardia. Chest pain resolves with tachycardia.  Patient back in SVT. Will give metoprolol at this time. After metoprolol, patient continues to be in NSR.    Myocardial perfusion scan in February that was within normal limits and EF of 53%. Delta troponin's negative. Patient states that as she converted to NSR, her chest pain resolved. This appears to be symtpomatic SVT. Patient's walked through ED without problems. We will increase her Carvedilol to 6.25 and have her follow up with her PCP and Cardiologist regarding her new SVT.   Final diagnoses:  Paroxysmal SVT (supraventricular tachycardia) (HCC)  Chest discomfort  Chronic diastolic heart failure (HCC)      Roberto Scales, MD 10/23/14 Arab[place], MD 10/27/14 1452

## 2014-10-23 NOTE — ED Notes (Signed)
While attempting blood draw, pt HR from 145 to NSR 85 bpm. EKG captured and given to MD.

## 2014-10-23 NOTE — ED Notes (Signed)
Pt intermittently in ST @ 140's, responds to vagal maneuvers and rate drops to 80's NSR. Resident at bedside attempting IV by ultrasound.

## 2015-01-17 ENCOUNTER — Emergency Department (HOSPITAL_COMMUNITY): Payer: Medicare Other

## 2015-01-17 ENCOUNTER — Observation Stay (HOSPITAL_COMMUNITY): Payer: Medicare Other

## 2015-01-17 ENCOUNTER — Encounter (HOSPITAL_COMMUNITY): Payer: Self-pay | Admitting: Emergency Medicine

## 2015-01-17 ENCOUNTER — Observation Stay (HOSPITAL_COMMUNITY)
Admission: EM | Admit: 2015-01-17 | Discharge: 2015-01-19 | Disposition: A | Discharge status: Home / Self Care | Payer: Medicare Other | Attending: Internal Medicine | Admitting: Internal Medicine

## 2015-01-17 DIAGNOSIS — E1122 Type 2 diabetes mellitus with diabetic chronic kidney disease: Secondary | ICD-10-CM | POA: Diagnosis not present

## 2015-01-17 DIAGNOSIS — J154 Pneumonia due to other streptococci: Secondary | ICD-10-CM | POA: Insufficient documentation

## 2015-01-17 DIAGNOSIS — I129 Hypertensive chronic kidney disease with stage 1 through stage 4 chronic kidney disease, or unspecified chronic kidney disease: Secondary | ICD-10-CM | POA: Diagnosis not present

## 2015-01-17 DIAGNOSIS — J189 Pneumonia, unspecified organism: Secondary | ICD-10-CM | POA: Insufficient documentation

## 2015-01-17 DIAGNOSIS — Z7982 Long term (current) use of aspirin: Secondary | ICD-10-CM | POA: Diagnosis not present

## 2015-01-17 DIAGNOSIS — Z87891 Personal history of nicotine dependence: Secondary | ICD-10-CM | POA: Diagnosis not present

## 2015-01-17 DIAGNOSIS — N183 Chronic kidney disease, stage 3 (moderate): Secondary | ICD-10-CM | POA: Diagnosis not present

## 2015-01-17 DIAGNOSIS — Z23 Encounter for immunization: Secondary | ICD-10-CM | POA: Insufficient documentation

## 2015-01-17 DIAGNOSIS — M25452 Effusion, left hip: Secondary | ICD-10-CM | POA: Diagnosis present

## 2015-01-17 DIAGNOSIS — F419 Anxiety disorder, unspecified: Secondary | ICD-10-CM | POA: Diagnosis not present

## 2015-01-17 DIAGNOSIS — H5442 Blindness, left eye, normal vision right eye: Secondary | ICD-10-CM | POA: Diagnosis not present

## 2015-01-17 DIAGNOSIS — K219 Gastro-esophageal reflux disease without esophagitis: Secondary | ICD-10-CM | POA: Diagnosis not present

## 2015-01-17 DIAGNOSIS — E119 Type 2 diabetes mellitus without complications: Secondary | ICD-10-CM

## 2015-01-17 DIAGNOSIS — Z85810 Personal history of malignant neoplasm of tongue: Secondary | ICD-10-CM | POA: Diagnosis not present

## 2015-01-17 DIAGNOSIS — E785 Hyperlipidemia, unspecified: Secondary | ICD-10-CM | POA: Insufficient documentation

## 2015-01-17 DIAGNOSIS — R509 Fever, unspecified: Secondary | ICD-10-CM | POA: Insufficient documentation

## 2015-01-17 DIAGNOSIS — F329 Major depressive disorder, single episode, unspecified: Secondary | ICD-10-CM | POA: Insufficient documentation

## 2015-01-17 DIAGNOSIS — D649 Anemia, unspecified: Secondary | ICD-10-CM | POA: Insufficient documentation

## 2015-01-17 DIAGNOSIS — Z96642 Presence of left artificial hip joint: Secondary | ICD-10-CM | POA: Insufficient documentation

## 2015-01-17 DIAGNOSIS — E7800 Pure hypercholesterolemia, unspecified: Secondary | ICD-10-CM | POA: Insufficient documentation

## 2015-01-17 DIAGNOSIS — M25552 Pain in left hip: Principal | ICD-10-CM | POA: Insufficient documentation

## 2015-01-17 DIAGNOSIS — M25459 Effusion, unspecified hip: Secondary | ICD-10-CM | POA: Diagnosis present

## 2015-01-17 DIAGNOSIS — I10 Essential (primary) hypertension: Secondary | ICD-10-CM | POA: Diagnosis present

## 2015-01-17 LAB — CBC WITH DIFFERENTIAL/PLATELET
Basophils Absolute: 0 10*3/uL (ref 0.0–0.1)
Basophils Relative: 0 %
Eosinophils Absolute: 0.1 10*3/uL (ref 0.0–0.7)
Eosinophils Relative: 2 %
HEMATOCRIT: 34.3 % — AB (ref 36.0–46.0)
Hemoglobin: 11.6 g/dL — ABNORMAL LOW (ref 12.0–15.0)
LYMPHS ABS: 1.3 10*3/uL (ref 0.7–4.0)
LYMPHS PCT: 19 %
MCH: 31.3 pg (ref 26.0–34.0)
MCHC: 33.8 g/dL (ref 30.0–36.0)
MCV: 92.5 fL (ref 78.0–100.0)
MONO ABS: 0.5 10*3/uL (ref 0.1–1.0)
MONOS PCT: 7 %
NEUTROS ABS: 4.9 10*3/uL (ref 1.7–7.7)
Neutrophils Relative %: 72 %
Platelets: 170 10*3/uL (ref 150–400)
RBC: 3.71 MIL/uL — ABNORMAL LOW (ref 3.87–5.11)
RDW: 14.1 % (ref 11.5–15.5)
WBC: 6.8 10*3/uL (ref 4.0–10.5)

## 2015-01-17 LAB — BASIC METABOLIC PANEL
ANION GAP: 11 (ref 5–15)
BUN: 14 mg/dL (ref 6–20)
CO2: 24 mmol/L (ref 22–32)
Calcium: 9.1 mg/dL (ref 8.9–10.3)
Chloride: 101 mmol/L (ref 101–111)
Creatinine, Ser: 1.28 mg/dL — ABNORMAL HIGH (ref 0.44–1.00)
GFR calc Af Amer: 45 mL/min — ABNORMAL LOW (ref >60)
GFR calc non Af Amer: 38 mL/min — ABNORMAL LOW (ref >60)
GLUCOSE: 122 mg/dL — AB (ref 65–99)
POTASSIUM: 3.6 mmol/L (ref 3.5–5.1)
Sodium: 136 mmol/L (ref 135–145)

## 2015-01-17 LAB — URINALYSIS, ROUTINE W REFLEX MICROSCOPIC
BILIRUBIN URINE: NEGATIVE
GLUCOSE, UA: NEGATIVE mg/dL
Hgb urine dipstick: NEGATIVE
KETONES UR: NEGATIVE mg/dL
Nitrite: NEGATIVE
PH: 5.5 (ref 5.0–8.0)
Protein, ur: NEGATIVE mg/dL
Specific Gravity, Urine: 1.011 (ref 1.005–1.030)

## 2015-01-17 LAB — URINE MICROSCOPIC-ADD ON: RBC / HPF: NONE SEEN RBC/hpf (ref 0–5)

## 2015-01-17 MED ORDER — MORPHINE SULFATE (PF) 2 MG/ML IV SOLN
2.0000 mg | Freq: Once | INTRAVENOUS | Status: AC
Start: 2015-01-17 — End: 2015-01-17
  Administered 2015-01-17: 2 mg via INTRAVENOUS
  Filled 2015-01-17: qty 1

## 2015-01-17 NOTE — ED Notes (Signed)
Per ems- pt c.o pain to L hip since noon today. Pt denies injury. sts she just felt like hip was swollen. Hip is obviously deformed. sts this has happened before but unsure what happened the last time

## 2015-01-17 NOTE — ED Notes (Signed)
Pt transported to radiology.

## 2015-01-17 NOTE — ED Provider Notes (Signed)
Patient seen/examined in the Emergency Department in conjunction with Resident Physician Provider  Patient reports left hip pain for several days, she reports she fell over the holidays Exam : awake/alert, no distress, obvious left hip deformity, distal pulses intact Plan: suspect left hip dislocation, imaging pending at this time    Ripley Fraise, MD 01/17/15 2119

## 2015-01-17 NOTE — ED Notes (Signed)
Pt took 2 Aleve and an adult dose of tylenol. Pelvic binding in place.

## 2015-01-17 NOTE — ED Provider Notes (Signed)
CSN: [id number]     Arrival date & time 01/17/15  2009 History   First MD Initiated Contact with Patient 01/17/15 2012     Chief Complaint  Patient presents with  . Hip Injury     (Consider location/radiation/quality/duration/timing/severity/associated sxs/prior Treatment) HPI Comments: 82 y.o. AAF with history of L hip fracture s/p hemiarthroplasty of L hip in 2013 presenting for L hip pain.  Patient reports fall (stubbling and falling to knees without hitting hip) about 1.5 weeks ago.  Since that time, she has noticed worsening L hip pain.  She did not notice any swelling or deformity until last night.  She attempted to take 2 Aleve this AM for pain, without relief.  She called EMS to bring her to ED.  No other recent injury or trauma.  Pain is currently 8/10.  She has noticed a slight nonproductive cough for about the last 1.5 weeks as well.  She also has diarrhea.  Denies any fevers, dysuria, SOB, chest pain.  Of note, patient also drinks 1/2 of a fifth of Bicardi and a six pack of beer every weekend.  Patient is a 82 y.o. female presenting with hip pain. The history is provided by the patient.  Hip Pain This is a new problem. The current episode started 1 to 4 weeks ago. The problem occurs constantly. The problem has been gradually worsening. Associated symptoms include coughing and joint swelling. Pertinent negatives include no fever. The symptoms are aggravated by walking. She has tried NSAIDs for the symptoms. The treatment provided no relief.    Past Medical History  Diagnosis Date  . Anemia 04/03/2011  . Hypertension   . High cholesterol   . Type II diabetes mellitus ([place])     not taking any medication  . Anginal pain ([place])   . Exertional dyspnea   . GERD (gastroesophageal reflux disease)   . Tongue cancer ([place]) 1998    S/P radiation  . Depression   . 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 Blind left eye 1999  . Hip fracture ([place]) 07/17/11    fall from  1-2 feet; left   Past Surgical History  Procedure Laterality Date  . Tubal ligation  1970's  . Tongue biopsy  1998    "cancer"  . Intraocular prostheses insertion  1999    left  . Hip arthroplasty  07/18/2011    Procedure: ARTHROPLASTY BIPOLAR HIP;  Surgeon: Nita Sells, MD;  Location: [facility];  Service: Orthopedics;  Laterality: Left;   Family History  Problem Relation Age of Onset  . Diabetes type II Mother   . Diabetes type II Father   . Diabetes type II Sister   . Colon cancer Neg Hx    Social History  Substance Use Topics  . Smoking status: Former Smoker -- 1.50 packs/day for 47 years    Types: Cigarettes    Quit date: 09/13/1997  . Smokeless tobacco: Never Used  . Alcohol Use: 10.2 oz/week    17 Shots of liquor per week     Comment: 07/17/11 "1/5 rum q weekend"   OB History    No data available     Review of Systems  Constitutional: Negative for fever.  Respiratory: Positive for cough.   Musculoskeletal: Positive for joint swelling.  All other systems reviewed and are negative.     Allergies  Review of patient's allergies indicates no known allergies.  Home Medications   Prior to Admission medications  Medication Sig Start Date End Date Taking? Authorizing Provider  ALPRAZolam Duanne Moron) 0.5 MG tablet Take 0.5 mg by mouth daily.   Yes Historical Provider, MD  aspirin 81 MG tablet Take 81 mg by mouth daily.   Yes Historical Provider, MD  carvedilol (COREG) 6.25 MG tablet Take 1 tablet (6.25 mg total) by mouth 2 (two) times daily with a meal. 10/23/14  Yes Roberto Scales, MD  DM-APAP-CPM (CORICIDIN HBP FLU PO) Take 30 mLs by mouth 2 (two) times daily. Patient states she takes every day per patient   Yes Historical Provider, MD  furosemide (LASIX) 20 MG tablet take 1 tablet by mouth once daily 06/16/14  Yes Burtis Junes, NP  hydrALAZINE (APRESOLINE) 25 MG tablet Take 25 mg by mouth 2 (two) times daily. 08/29/14  Yes Historical Provider, MD  lisinopril  (PRINIVIL,ZESTRIL) 40 MG tablet Take 40 mg by mouth daily.   Yes Historical Provider, MD  Multiple Vitamin (MULTIVITAMIN WITH MINERALS) TABS tablet Take 1 tablet by mouth daily.   Yes Historical Provider, MD  omeprazole (PRILOSEC) 20 MG capsule Take 20 mg by mouth daily.    Yes Historical Provider, MD  pyridOXINE (VITAMIN B-6) 100 MG tablet Take 100 mg by mouth daily.   Yes Historical Provider, MD  simvastatin (ZOCOR) 40 MG tablet Take 40 mg by mouth every morning.    Yes Historical Provider, MD  TRAVATAN Z 0.004 % SOLN ophthalmic solution Place 1 drop into the right eye at bedtime.  11/24/12  Yes Historical Provider, MD   BP 197/83 mmHg  Pulse 92  Temp(Src) 101.2 F (38.4 C) (Oral)  Resp 18  SpO2 93% Physical Exam  Constitutional: She is oriented to person, place, and time. She appears well-developed and well-nourished.  Appears uncomfortable  HENT:  Head: Normocephalic and atraumatic.  Right Ear: External ear normal.  Left Ear: External ear normal.  Mouth/Throat: Oropharynx is clear and moist. No oropharyngeal exudate.  Eyes: Conjunctivae and EOM are normal. Pupils are equal, round, and reactive to light.  Neck: Neck supple.  Cardiovascular: Normal rate, regular rhythm and normal heart sounds.   No murmur heard. +DP and PT pulses  Pulmonary/Chest: Effort normal and breath sounds normal. No respiratory distress. She has no wheezes. She has no rales.  Abdominal: Soft. Bowel sounds are normal. She exhibits no distension. There is no tenderness. There is no rebound and no guarding.  Musculoskeletal:  Obvious deformity of L hip with internal rotation, TTP over deformity No redness, warmth, or fluctuance over deformity  Lymphadenopathy:    She has no cervical adenopathy.  Neurological: She is alert and oriented to person, place, and time.  Sensation intact, able to move L foot and toes  Skin: Skin is warm and dry. No rash noted.  Psychiatric: She has a normal mood and affect. Her  behavior is normal.    ED Course  Procedures (including critical care time) Labs Review Labs Reviewed  BASIC METABOLIC PANEL - Abnormal; Notable for the following:    Glucose, Bld 122 (*)    Creatinine, Ser 1.28 (*)    GFR calc non Af Amer 38 (*)    GFR calc Af Amer 45 (*)    All other components within normal limits  CBC WITH DIFFERENTIAL/PLATELET - Abnormal; Notable for the following:    RBC 3.71 (*)    Hemoglobin 11.6 (*)    HCT 34.3 (*)    All other components within normal limits  URINALYSIS, ROUTINE W REFLEX MICROSCOPIC (NOT AT  [facility])    Imaging Review Dg Chest 1 View  01/17/2015  CLINICAL DATA:  Cough for 1 week. EXAM: CHEST 1 VIEW COMPARISON:  10/23/2014, and 02/17/2014 FINDINGS: Stable cardiomegaly and tortuous thoracic aorta. Interstitial thickening in the upper lobes is unchanged from most recent exam, however may be progressed from more remote exam of February 2016. No confluent airspace disease. No pulmonary edema, large pleural effusion or pneumothorax. IMPRESSION: Interstitial thickening in the upper lobes is unchanged from most recent exam, however likely progressed from more remote exam. This may reflect chronic interstitial change, however atypical infection could have a similar appearance. Electronically Signed   By: Jeb Levering M.D.   On: 01/17/2015 21:53   Dg Hip Unilat With Pelvis 2-3 Views Left  01/17/2015  CLINICAL DATA:  Pain and swelling left hip region EXAM: DG HIP (WITH OR WITHOUT PELVIS) 2-3V LEFT COMPARISON:  July 17, 2011 FINDINGS: Frontal pelvis as well as frontal and lateral left hip images were obtained. There is marked soft tissue swelling/fullness lateral to the left proximal femur. There is a total hip prosthesis on the left with prosthetic components well-seated. There is no acute fracture or dislocation. There is slight narrowing of the right hip joint. No erosive changes. There is extensive vascular calcification. IMPRESSION: Extensive soft tissue  swelling/fullness lateral to the proximal left femur. Etiology uncertain. Mass or inflammatory/infectious lesion cannot be excluded radiographically. There is no obvious air or calcification within this region of swelling/soft tissue fullness. There is a total hip prosthesis on the left with prosthetic components well-seated. No acute fracture or dislocation. There is extensive arterial vascular calcification. Electronically Signed   By: Lowella Grip III M.D.   On: 01/17/2015 21:51   I have personally reviewed and evaluated these images and lab results as part of my medical decision-making.   EKG Interpretation None      MDM   Final diagnoses:  Hip swelling, left    Patient presenting with obvious deformity of L hip with h/o hemiarthroplasty in 2013.  L hip XRay shows no dislocation or fracture, but soft tissue swelling proximal to L femur.  No fluctuance, redness, or warmth, so abscess unlikely.  Unclear etiology of palpable mass that initially appeared to be dislocation that is rather tender.  Pain is improved s/p Morphine IV 2mg  x1.  Patient unable to ambulate on leg though.  Patient also febrile to 101.2.  CXR with stable interstitial thickening in upper lobe (unchanged from CXR in 10/2014), CBC without leukocytosis, BMP with Cr 1.28 (baseline ~1). UA with trace leukocytes and squams present.  Suspect etiology of fever is likely viral with unremarkable UA and CXR.  Cannot exclude possibility that it is related to L hip mass (though it does not appear to be abscess).  Plan [facility] for further work-up of L hip swelling and pain control.    Virginia Crews, MD 01/17/15 [id number]  Ripley Fraise, MD 01/18/15 [phone]

## 2015-01-18 ENCOUNTER — Observation Stay (HOSPITAL_COMMUNITY): Payer: Medicare Other

## 2015-01-18 ENCOUNTER — Encounter (HOSPITAL_COMMUNITY): Payer: Self-pay | Admitting: Family Medicine

## 2015-01-18 DIAGNOSIS — M25452 Effusion, left hip: Secondary | ICD-10-CM

## 2015-01-18 DIAGNOSIS — J189 Pneumonia, unspecified organism: Secondary | ICD-10-CM

## 2015-01-18 DIAGNOSIS — E118 Type 2 diabetes mellitus with unspecified complications: Secondary | ICD-10-CM

## 2015-01-18 DIAGNOSIS — I10 Essential (primary) hypertension: Secondary | ICD-10-CM

## 2015-01-18 DIAGNOSIS — D631 Anemia in chronic kidney disease: Secondary | ICD-10-CM

## 2015-01-18 DIAGNOSIS — N189 Chronic kidney disease, unspecified: Secondary | ICD-10-CM | POA: Diagnosis not present

## 2015-01-18 DIAGNOSIS — R509 Fever, unspecified: Secondary | ICD-10-CM | POA: Diagnosis not present

## 2015-01-18 LAB — CBC
HCT: 31.2 % — ABNORMAL LOW (ref 36.0–46.0)
HEMOGLOBIN: 10.3 g/dL — AB (ref 12.0–15.0)
MCH: 30.7 pg (ref 26.0–34.0)
MCHC: 33 g/dL (ref 30.0–36.0)
MCV: 92.9 fL (ref 78.0–100.0)
PLATELETS: 176 10*3/uL (ref 150–400)
RBC: 3.36 MIL/uL — AB (ref 3.87–5.11)
RDW: 14.2 % (ref 11.5–15.5)
WBC: 6.3 10*3/uL (ref 4.0–10.5)

## 2015-01-18 LAB — GLUCOSE, CAPILLARY
Glucose-Capillary: 67 mg/dL (ref 65–99)
Glucose-Capillary: 99 mg/dL (ref 65–99)
Glucose-Capillary: 90 mg/dL (ref 65–99)

## 2015-01-18 LAB — BASIC METABOLIC PANEL
Anion gap: 8 (ref 5–15)
BUN: 10 mg/dL (ref 6–20)
CALCIUM: 8.7 mg/dL — AB (ref 8.9–10.3)
CO2: 27 mmol/L (ref 22–32)
CREATININE: 1.19 mg/dL — AB (ref 0.44–1.00)
Chloride: 104 mmol/L (ref 101–111)
GFR calc Af Amer: 49 mL/min — ABNORMAL LOW (ref >60)
GFR calc non Af Amer: 42 mL/min — ABNORMAL LOW (ref >60)
GLUCOSE: 93 mg/dL (ref 65–99)
Potassium: 2.9 mmol/L — ABNORMAL LOW (ref 3.5–5.1)
SODIUM: 139 mmol/L (ref 135–145)

## 2015-01-18 LAB — PROCALCITONIN: Procalcitonin: 0.11 ng/mL

## 2015-01-18 LAB — GRAM STAIN

## 2015-01-18 LAB — PROTIME-INR
INR: 1.17 (ref 0.00–1.49)
Prothrombin Time: 15.1 seconds (ref 11.6–15.2)

## 2015-01-18 LAB — HIV ANTIBODY (ROUTINE TESTING W REFLEX): HIV SCREEN 4TH GENERATION: NONREACTIVE

## 2015-01-18 LAB — STREP PNEUMONIAE URINARY ANTIGEN: STREP PNEUMO URINARY ANTIGEN: POSITIVE — AB

## 2015-01-18 MED ORDER — IOHEXOL 300 MG/ML  SOLN
100.0000 mL | Freq: Once | INTRAMUSCULAR | Status: AC | PRN
  Administered 2015-01-18: 100 mL via INTRAVENOUS

## 2015-01-18 MED ORDER — ENOXAPARIN SODIUM 40 MG/0.4ML ~~LOC~~ SOLN
40.0000 mg | SUBCUTANEOUS | Status: DC
  Administered 2015-01-18 – 2015-01-19 (×2): 40 mg via SUBCUTANEOUS
  Filled 2015-01-18 (×2): qty 0.4

## 2015-01-18 MED ORDER — HYDRALAZINE HCL 25 MG PO TABS
25.0000 mg | ORAL_TABLET | Freq: Two times a day (BID) | ORAL | Status: DC
  Administered 2015-01-18 – 2015-01-19 (×3): 25 mg via ORAL
  Filled 2015-01-18 (×3): qty 1

## 2015-01-18 MED ORDER — DEXTROSE 5 % IV SOLN
1.0000 g | INTRAVENOUS | Status: DC
  Administered 2015-01-18 – 2015-01-19 (×2): 1 g via INTRAVENOUS
  Filled 2015-01-18 (×3): qty 10

## 2015-01-18 MED ORDER — INSULIN ASPART 100 UNIT/ML ~~LOC~~ SOLN: 0.0000 [IU] | Freq: Every day | SUBCUTANEOUS | Status: DC

## 2015-01-18 MED ORDER — ACETAMINOPHEN 325 MG PO TABS
650.0000 mg | ORAL_TABLET | Freq: Four times a day (QID) | ORAL | Status: DC | PRN
Start: 2015-01-18 — End: 2015-01-19
  Administered 2015-01-18 (×3): 650 mg via ORAL
  Filled 2015-01-18 (×3): qty 2

## 2015-01-18 MED ORDER — INFLUENZA VAC SPLIT QUAD 0.5 ML IM SUSY
0.5000 mL | PREFILLED_SYRINGE | INTRAMUSCULAR | Status: AC
  Administered 2015-01-19: 0.5 mL via INTRAMUSCULAR

## 2015-01-18 MED ORDER — POTASSIUM CHLORIDE CRYS ER 20 MEQ PO TBCR
40.0000 meq | EXTENDED_RELEASE_TABLET | Freq: Four times a day (QID) | ORAL | Status: AC
  Administered 2015-01-18 (×3): 40 meq via ORAL
  Filled 2015-01-18 (×3): qty 2

## 2015-01-18 MED ORDER — ALPRAZOLAM 0.5 MG PO TABS
0.5000 mg | ORAL_TABLET | Freq: Every day | ORAL | Status: DC
  Administered 2015-01-18 – 2015-01-19 (×2): 0.5 mg via ORAL
  Filled 2015-01-18 (×2): qty 1

## 2015-01-18 MED ORDER — OXYCODONE HCL 5 MG PO TABS
5.0000 mg | ORAL_TABLET | ORAL | Status: DC | PRN
  Administered 2015-01-18 – 2015-01-19 (×3): 5 mg via ORAL
  Filled 2015-01-18 (×3): qty 1

## 2015-01-18 MED ORDER — ACETAMINOPHEN 650 MG RE SUPP: 650.0000 mg | Freq: Four times a day (QID) | RECTAL | Status: DC | PRN

## 2015-01-18 MED ORDER — WHITE PETROLATUM GEL
Status: AC
  Administered 2015-01-18: 1
  Filled 2015-01-18: qty 1

## 2015-01-18 MED ORDER — ASPIRIN EC 81 MG PO TBEC
81.0000 mg | DELAYED_RELEASE_TABLET | Freq: Every day | ORAL | Status: DC
  Administered 2015-01-18 – 2015-01-19 (×2): 81 mg via ORAL
  Filled 2015-01-18 (×2): qty 1

## 2015-01-18 MED ORDER — AZITHROMYCIN 500 MG PO TABS
500.0000 mg | ORAL_TABLET | ORAL | Status: DC
Start: 2015-01-18 — End: 2015-01-18
  Administered 2015-01-18: 500 mg via ORAL
  Filled 2015-01-18 (×2): qty 1

## 2015-01-18 MED ORDER — LISINOPRIL 40 MG PO TABS
40.0000 mg | ORAL_TABLET | Freq: Every day | ORAL | Status: DC
  Administered 2015-01-18 – 2015-01-19 (×2): 40 mg via ORAL
  Filled 2015-01-18 (×2): qty 1

## 2015-01-18 MED ORDER — SIMVASTATIN 40 MG PO TABS
40.0000 mg | ORAL_TABLET | Freq: Every morning | ORAL | Status: DC
  Administered 2015-01-18 – 2015-01-19 (×2): 40 mg via ORAL
  Filled 2015-01-18 (×2): qty 1

## 2015-01-18 MED ORDER — ENSURE ENLIVE PO LIQD
237.0000 mL | Freq: Two times a day (BID) | ORAL | Status: DC
  Administered 2015-01-18 – 2015-01-19 (×3): 237 mL via ORAL

## 2015-01-18 MED ORDER — VITAMIN B-6 100 MG PO TABS
100.0000 mg | ORAL_TABLET | Freq: Every day | ORAL | Status: DC
  Administered 2015-01-18 – 2015-01-19 (×2): 100 mg via ORAL
  Filled 2015-01-18 (×2): qty 1

## 2015-01-18 MED ORDER — INSULIN ASPART 100 UNIT/ML ~~LOC~~ SOLN: 0.0000 [IU] | Freq: Three times a day (TID) | SUBCUTANEOUS | Status: DC

## 2015-01-18 MED ORDER — PANTOPRAZOLE SODIUM 40 MG PO TBEC
40.0000 mg | DELAYED_RELEASE_TABLET | Freq: Every day | ORAL | Status: DC
Start: 2015-01-18 — End: 2015-01-19
  Administered 2015-01-18 – 2015-01-19 (×2): 40 mg via ORAL
  Filled 2015-01-18 (×2): qty 1

## 2015-01-18 MED ORDER — PNEUMOCOCCAL VAC POLYVALENT 25 MCG/0.5ML IJ INJ
0.5000 mL | INJECTION | INTRAMUSCULAR | Status: AC
  Administered 2015-01-19: 0.5 mL via INTRAMUSCULAR

## 2015-01-18 MED ORDER — SODIUM CHLORIDE 0.9 % IV BOLUS (SEPSIS)
1000.0000 mL | Freq: Once | INTRAVENOUS | Status: AC
  Administered 2015-01-18: 1000 mL via INTRAVENOUS

## 2015-01-18 MED ORDER — CARVEDILOL 6.25 MG PO TABS
6.2500 mg | ORAL_TABLET | Freq: Two times a day (BID) | ORAL | Status: DC
Start: 2015-01-18 — End: 2015-01-19
  Administered 2015-01-18 – 2015-01-19 (×3): 6.25 mg via ORAL
  Filled 2015-01-18 (×3): qty 1

## 2015-01-18 NOTE — Progress Notes (Signed)
Initial Nutrition Assessment  DOCUMENTATION CODES:   Not applicable  INTERVENTION:  Continue Ensure Enlive po BID, each supplement provides 350 kcal and 20 grams of protein.  Encourage adequate PO intake.   Recommend obtaining new weight to fully assess weight trends.   NUTRITION DIAGNOSIS:   Inadequate oral intake related to poor appetite as evidenced by meal completion < 50%.  GOAL:   Patient will meet greater than or equal to 90% of their needs  MONITOR:   PO intake, Supplement acceptance, Weight trends, Labs, I & O's  REASON FOR ASSESSMENT:   Malnutrition Screening Tool    ASSESSMENT:   82 y.o. female with a past medical history significant for NIDDM, alcohol abuse, HTN, and CKD III who presents with hip swelling.  Meal completion has been 25-50%. Pt reports having a decreased appetite. She reports PTA usually consuming 2 meals a day. She does also report drinking Ensure Shakes 3 times daily. Pt currently has Ensure ordered and has been consuming them. RD to continue with orders. Noted no recent weight recorded. Pt attempted to be weighed on bed scale, however the bed scale was not working. Recommend obtaining new weight to fully assess weight trends. Pt reports she does not know her usual body weight. She does report weight loss, however says it was 3 years ago.   Nutrition-Focused physical exam completed. Findings are no fat depletion, moderate muscle depletion, and mild edema.   Labs and medications reviewed.   Diet Order:  Diet heart healthy/carb modified Room service appropriate?: Yes; Fluid consistency:: Thin  Skin:  Reviewed, no issues  Last BM:  1/4  Height:   Ht Readings from Last 1 Encounters:  03/17/14 5' (1.524 m)    Weight:   Wt Readings from Last 1 Encounters:  03/17/14 141 lb 12.8 oz (64.32 kg)    Ideal Body Weight:  45.45 kg  BMI:  There is no weight on file to calculate BMI.  Estimated Nutritional Needs:   Kcal:   1600-1800  Protein:  70-80 grams  Fluid:  1.6 - 1.8 L/day  EDUCATION NEEDS:   No education needs identified at this time  Corrin Parker, MS, RD, LDN Pager # [phone] After hours/ weekend pager # [phone]

## 2015-01-18 NOTE — Progress Notes (Signed)
Patient ID: [name], female   DOB: 07-31-1932, 82 y.o.   MRN: [id number] i have seen patient and aspirated l bursae and got 50 cc of frank blood.  I will send for studies but think low likelyhood of infection.  Will await study results and will compress and ice area. Full consult to follow later tonight.  Noe restrictions to activity at this point.

## 2015-01-18 NOTE — H&P (Signed)
History and Physical  Patient Name: [name]     [id number]    DOB: 1933/01/03    DOA: 01/17/2015 Referring physician: Lavon Paganini, MD PCP: Elizabeth Palau, MD      Chief Complaint: Leg pain  HPI: [name] is a 82 y.o. female with a past medical history significant for NIDDM, alcohol abuse, HTN, and CKD III who presents with hip swelling.  The patient was in her normal state of health until about two days ago when her chronic lateral LEFT hip pain got worse, and she noticed a swelling in the left hip.  This was severely painful, and so she came to the ER.  Separately, the patient has had upper respiratory sypmtoms and productive cough since Christmas that are now worsening.   In the ED, she was noted to be febrile to 102F, with hypertension and mild tachycardia without hypoxia or leukocytosis.  With regard to the leg, this was hard and bony appearing, but appeared to be soft tissue or even fluid on x-ray and CT (degraded by nearby prosthesis), wtihout overlying warmth, redness, or swelling.  The patient is very clear this swelling was not present as recently as two days ago.  With regard to the lungs, she had basilar crackles and a chest x-ray that showed bilateral upper lobe streaky infiltrates.       Review of Systems:  Pt complains of leg pain, worsening cough, fever. Pt denies any SOB.  All other systems negative except as just noted or noted in the history of present illness.  No Known Allergies  Prior to Admission medications   Medication Sig Start Date End Date Taking? Authorizing Provider  ALPRAZolam Duanne Moron) 0.5 MG tablet Take 0.5 mg by mouth daily.   Yes Historical Provider, MD  aspirin 81 MG tablet Take 81 mg by mouth daily.   Yes Historical Provider, MD  carvedilol (COREG) 6.25 MG tablet Take 1 tablet (6.25 mg total) by mouth 2 (two) times daily with a meal. 10/23/14  Yes Roberto Scales, MD  DM-APAP-CPM (CORICIDIN HBP FLU PO) Take 30 mLs by  mouth 2 (two) times daily. Patient states she takes every day per patient   Yes Historical Provider, MD  furosemide (LASIX) 20 MG tablet take 1 tablet by mouth once daily 06/16/14  Yes Burtis Junes, NP  hydrALAZINE (APRESOLINE) 25 MG tablet Take 25 mg by mouth 2 (two) times daily. 08/29/14  Yes Historical Provider, MD  lisinopril (PRINIVIL,ZESTRIL) 40 MG tablet Take 40 mg by mouth daily.   Yes Historical Provider, MD  Multiple Vitamin (MULTIVITAMIN WITH MINERALS) TABS tablet Take 1 tablet by mouth daily.   Yes Historical Provider, MD  omeprazole (PRILOSEC) 20 MG capsule Take 20 mg by mouth daily.    Yes Historical Provider, MD  pyridOXINE (VITAMIN B-6) 100 MG tablet Take 100 mg by mouth daily.   Yes Historical Provider, MD  simvastatin (ZOCOR) 40 MG tablet Take 40 mg by mouth every morning.    Yes Historical Provider, MD  TRAVATAN Z 0.004 % SOLN ophthalmic solution Place 1 drop into the right eye at bedtime.  11/24/12  Yes Historical Provider, MD    Past Medical History  Diagnosis Date  . Anemia 04/03/2011  . Hypertension   . High cholesterol   . Type II diabetes mellitus ([place])     not taking any medication  . Anginal pain ([place])   . Exertional dyspnea   . GERD (gastroesophageal reflux disease)   . Tongue cancer ([place]) 1998  S/P radiation  . Depression   . 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 Blind left eye 1999  . Hip fracture ([place]) 07/17/11    fall from 1-2 feet; left    Past Surgical History  Procedure Laterality Date  . Tubal ligation  1970's  . Tongue biopsy  1998    "cancer"  . Intraocular prostheses insertion  1999    left  . Hip arthroplasty  07/18/2011    Procedure: ARTHROPLASTY BIPOLAR HIP;  Surgeon: Nita Sells, MD;  Location: [facility];  Service: Orthopedics;  Laterality: Left;    Family history: family history includes Diabetes type II in her father, mother, and sister. There is no history of Colon cancer.  Social History: Patient lives  with family.  She reports regular use of liquor.  She denies history of withdrawals.  She does not smoke.  She denies illicit drugs.       Physical Exam: BP 211/92 mmHg  Pulse 91  Temp(Src) 100.6 F (38.1 C) (Oral)  Resp 18  SpO2 95% General appearance: Thin elderly adult female, alert and in no acute distress.   Eyes: Conjunctiva injected, lids and lashes normal.  Left eye lazy. ENT: No nasal deformity, discharge, or epistaxis.  OP moist without lesions.  Edentulous. Lymph: No cervical lymphadenopathy. Skin: Warm and dry.  Cardiac: RRR, nl S1-S2, no murmurs appreciated.  Capillary refill is brisk.  No LE edema.   Respiratory: Normal respiratory rate and rhythm.  No wheezes.  Crackles at bases both sides.  No abnormal lung sounds at apices. Abdomen: Abdomen soft without rigidity.  No TTP. No ascites, distension.   MSK: over the left lateral greater trochanter, there is a knob-like large, baseball sized lump.  This is firm, as if bony.  It is not fluctuant, and there is no nearby edema or redness that I can tell. Neuro: Sensorium intact and responding to questions, attention normal.  Speech is fluent.  Moves all extremities equally and with normal coordination.    Psych: Behavior appropriate.  Affect noraml.  No evidence of aural or visual hallucinations or delusions.       Labs on Admission:  The metabolic panel shows normal electrolytes and baseline creatinine 1.2 mg/dL, CKD II-III. UA is clear. The complete blood count shows no leukocytosis.  Chronic normocytic anemia, stable.   Radiological Exams on Admission: Personally reviewed: Dg Chest 1 View  01/17/2015  CLINICAL DATA:  Cough for 1 week. EXAM: CHEST 1 VIEW COMPARISON:  10/23/2014, and 02/17/2014 FINDINGS: Stable cardiomegaly and tortuous thoracic aorta. Interstitial thickening in the upper lobes is unchanged from most recent exam, however may be progressed from more remote exam of February 2016. No confluent airspace  disease. No pulmonary edema, large pleural effusion or pneumothorax. IMPRESSION: Interstitial thickening in the upper lobes is unchanged from most recent exam, however likely progressed from more remote exam. This may reflect chronic interstitial change, however atypical infection could have a similar appearance. Electronically Signed   By: Jeb Levering M.D.   On: 01/17/2015 21:53   Ct Hip Left W Contrast  01/18/2015  CLINICAL DATA:  Left hip pain and swelling for 12 hours. No known injury. EXAM: CT OF THE LEFT HIP WITH CONTRAST TECHNIQUE: Multidetector CT imaging was performed following the standard protocol during bolus administration of intravenous contrast. Patient returned for additional imaging utilizing metal reduction technique. CONTRAST:  131mL OMNIPAQUE IOHEXOL 300 MG/ML  SOLN COMPARISON:  Pelvis and hip radiographs  earlier this day. CT abdomen/pelvis 07/10/2014 FINDINGS: Post left hip arthroplasty with surrounding streak artifact partially obscuring soft tissues. Adjacent the lateral aspect of the trochanteric portion is a fluid collection just lateral to the greater trochanter. This measures approximately 5.4 x 4.9 cm. This may be within or subjacent to the gluteal musculature. This corresponds to the abnormality on radiograph. There is mild adjacent soft tissue stranding about the inferior aspect. No periprosthetic lucencies are seen. No fracture no periosteal reaction or bony destructive change. Large left adnexal dermoid is only partially included. 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 IMPRESSION: 1. Left hip arthroplasty in place. There is an adjacent fluid collection adjacent to the greater trochanter, either within or deep to the gluteus musculature, measuring approximately 5 cm. Detailed fluid collection characterization is limited secondary to adjacent streak artifact. Hematoma or abscess are considered. This appears to represent fluid density,  rather than soft tissue mass, however continued clinical follow-up and clinical correlation recommended. 2. 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 Electronically Signed   By: Jeb Levering M.D.   On: 01/18/2015 01:32   Dg Hip Unilat With Pelvis 2-3 Views Left  01/17/2015  CLINICAL DATA:  Pain and swelling left hip region EXAM: DG HIP (WITH OR WITHOUT PELVIS) 2-3V LEFT COMPARISON:  July 17, 2011 FINDINGS: Frontal pelvis as well as frontal and lateral left hip images were obtained. There is marked soft tissue swelling/fullness lateral to the left proximal femur. There is a total hip prosthesis on the left with prosthetic components well-seated. There is no acute fracture or dislocation. There is slight narrowing of the right hip joint. No erosive changes. There is extensive vascular calcification. IMPRESSION: Extensive soft tissue swelling/fullness lateral to the proximal left femur. Etiology uncertain. Mass or inflammatory/infectious lesion cannot be excluded radiographically. There is no obvious air or calcification within this region of swelling/soft tissue fullness. There is a total hip prosthesis on the left with prosthetic components well-seated. No acute fracture or dislocation. There is extensive arterial vascular calcification. Electronically Signed   By: Lowella Grip III M.D.   On: 01/17/2015 21:51       Assessment/Plan 1. Hip swelling:  This is new.  Etiology unclear to me.  Development was acute, over two days.  This seems hard to believe given how hard it is. It does not appear red or swollen or infected. -US soft tissues of the hip -Consult to orthopedics  2. Fever/CAP:  The patients fever only localizes to the pulmonary system. -Azithromycin 500 qdaily and ceftriaxone -Check procalcitonin and urinary antigen strep and de-escalate if possible  3. Anemia:  Stable.   4. CKD stage III:  5. HTN:    Hypertensive at admission. -Continue home carvedilol, hydralazine, aspirin, lisinopril, statin  6. NIDDM:  Diet controlled at home -Correction insulin  7. Anxiety:  -Continue home alprazolam     DVT PPx: Lovenox  Consultants: Orthopedics Code Status: Full Family Communication: None  Medical decision making: What exists of the patient's previous chart was reviewed in depth and the case was discussed with Dr. Brita Romp. Patient seen 3:47 AM on 01/18/2015.  Disposition Plan:  Admit for evaluation of hip swelling.  Anticipate 3-4 days admission.      Edwin Dada [facility] Pager [phone]

## 2015-01-18 NOTE — Progress Notes (Signed)
[facility] PROGRESS NOTE  [name] [id number] DOB: 04-09-1932 DOA: 01/17/2015 PCP: Elizabeth Palau, MD  Assessment/Plan: 1. Left hip pain. -Patient having a history of left hemiarthroplasty in 2013 -Reported having a fall without injury to hip about 10 days ago. -Pain becoming severe over the past 24 hours. On initial evaluation found to have swelling over lateral aspect of hip. She was further worked up with a CT scan that revealed a fluid collection lateral to greater trochanter -Case was discussed with orthopedic surgery who will evaluate patient today. -Patient is otherwise nontoxic appearing -Had temperature of 101.2 overnight -She was started on IV ceftriaxone.  2.  Hypertension. -She had elevated blood pressures overnight I suspect secondary to left hip pain. Blood pressures coming down this morning. -Plan to continue Coreg 6.25 mg by mouth twice a day, hydralazine 25 mg by mouth twice a day, lisinopril 40 mg by mouth daily  3.  Dyslipidemia. -Continue simvastatin 40 mg by mouth daily  4.  Possible community-acquired pneumonia versus bronchitis. -Patient developing cough, chest x-ray showed interstitial thickening which could be consistent with atypical infection -She was started on empiric IV and microbial therapy with ceftriaxone and azithromycin  Code Status: Full code Family Communication: Family not present Disposition Plan: Orthopedic consultation pending   Consultants:  Orthopedic surgery  Antibiotics:  Ceftriaxone  Azithromycin  HPI/Subjective: [name] is a pleasant 82 year old female with a history of left hip hemiarthroplasty performed in 2013 by Dr. Tamera Punt of orthopedic surgery, had reported having intermittent left hip pain since then however experienced severe left hip pain in the past 4 hours. In the emergency room found to have swelling over lateral aspect of hip. This was further worked up with a CT scan that showed  possible fluid collection. Case was discussed with orthopedic surgery who will evaluate patient today.  Objective: Filed Vitals:   01/18/15 0136 01/18/15 0555  BP: 211/92 153/71  Pulse: 91 85  Temp: 100.6 F (38.1 C) 99 F (37.2 C)  Resp: 18 16    Intake/Output Summary (Last 24 hours) at 01/18/15 0916 Last data filed at 01/18/15 0742  Gross per 24 hour  Intake     50 ml  Output      0 ml  Net     50 ml   There were no vitals filed for this visit.  Exam:   General:  Nontoxic appearing, awake and alert no acute distress  Cardiovascular: Regular rate and rhythm normal S1-S2 no murmurs rubs or gallops  Respiratory: Normal respiratory effort, lungs are clear to auscultation bilaterally  Abdomen: Soft nontender nondistended  Musculoskeletal: Patient having left hip swelling, masslike prominence over the lateral aspect of the left hip which is nonfluctuant. Not significantly tender. No erythema  Data Reviewed: Basic Metabolic Panel:  Recent Labs Lab 01/17/15 2109 01/18/15 0446  NA 136 139  K 3.6 2.9*  CL 101 104  CO2 24 27  GLUCOSE 122* 93  BUN 14 10  CREATININE 1.28* 1.19*  CALCIUM 9.1 8.7*   Liver Function Tests: No results for input(s): AST, ALT, ALKPHOS, BILITOT, PROT, ALBUMIN in the last 168 hours. No results for input(s): LIPASE, AMYLASE in the last 168 hours. No results for input(s): AMMONIA in the last 168 hours. CBC:  Recent Labs Lab 01/17/15 2109 01/18/15 0446  WBC 6.8 6.3  NEUTROABS 4.9  --   HGB 11.6* 10.3*  HCT 34.3* 31.2*  MCV 92.5 92.9  PLT 170 176   Cardiac Enzymes: No results for  input(s): CKTOTAL, CKMB, CKMBINDEX, TROPONINI in the last 168 hours. BNP (last 3 results)  Recent Labs  02/17/14 1255  BNP 322.7*    ProBNP (last 3 results) No results for input(s): PROBNP in the last 8760 hours.  CBG: No results for input(s): GLUCAP in the last 168 hours.  No results found for this or any previous visit (from the past 240  hour(s)).   Studies: Dg Chest 1 View  01/17/2015  CLINICAL DATA:  Cough for 1 week. EXAM: CHEST 1 VIEW COMPARISON:  10/23/2014, and 02/17/2014 FINDINGS: Stable cardiomegaly and tortuous thoracic aorta. Interstitial thickening in the upper lobes is unchanged from most recent exam, however may be progressed from more remote exam of February 2016. No confluent airspace disease. No pulmonary edema, large pleural effusion or pneumothorax. IMPRESSION: Interstitial thickening in the upper lobes is unchanged from most recent exam, however likely progressed from more remote exam. This may reflect chronic interstitial change, however atypical infection could have a similar appearance. Electronically Signed   By: Jeb Levering M.D.   On: 01/17/2015 21:53   Ct Hip Left W Contrast  01/18/2015  CLINICAL DATA:  Left hip pain and swelling for 12 hours. No known injury. EXAM: CT OF THE LEFT HIP WITH CONTRAST TECHNIQUE: Multidetector CT imaging was performed following the standard protocol during bolus administration of intravenous contrast. Patient returned for additional imaging utilizing metal reduction technique. CONTRAST:  172mL OMNIPAQUE IOHEXOL 300 MG/ML  SOLN COMPARISON:  Pelvis and hip radiographs earlier this day. CT abdomen/pelvis 07/10/2014 FINDINGS: Post left hip arthroplasty with surrounding streak artifact partially obscuring soft tissues. Adjacent the lateral aspect of the trochanteric portion is a fluid collection just lateral to the greater trochanter. This measures approximately 5.4 x 4.9 cm. This may be within or subjacent to the gluteal musculature. This corresponds to the abnormality on radiograph. There is mild adjacent soft tissue stranding about the inferior aspect. No periprosthetic lucencies are seen. No fracture no periosteal reaction or bony destructive change. Large left adnexal dermoid is only partially included. 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  IMPRESSION: 1. Left hip arthroplasty in place. There is an adjacent fluid collection adjacent to the greater trochanter, either within or deep to the gluteus musculature, measuring approximately 5 cm. Detailed fluid collection characterization is limited secondary to adjacent streak artifact. Hematoma or abscess are considered. This appears to represent fluid density, rather than soft tissue mass, however continued clinical follow-up and clinical correlation recommended. 2. 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 Electronically Signed   By: Jeb Levering M.D.   On: 01/18/2015 01:32   Dg Hip Unilat With Pelvis 2-3 Views Left  01/17/2015  CLINICAL DATA:  Pain and swelling left hip region EXAM: DG HIP (WITH OR WITHOUT PELVIS) 2-3V LEFT COMPARISON:  July 17, 2011 FINDINGS: Frontal pelvis as well as frontal and lateral left hip images were obtained. There is marked soft tissue swelling/fullness lateral to the left proximal femur. There is a total hip prosthesis on the left with prosthetic components well-seated. There is no acute fracture or dislocation. There is slight narrowing of the right hip joint. No erosive changes. There is extensive vascular calcification. IMPRESSION: Extensive soft tissue swelling/fullness lateral to the proximal left femur. Etiology uncertain. Mass or inflammatory/infectious lesion cannot be excluded radiographically. There is no obvious air or calcification within this region of swelling/soft tissue fullness. There is a total hip  prosthesis on the left with prosthetic components well-seated. No acute fracture or dislocation. There is extensive arterial vascular calcification. Electronically Signed   By: Lowella Grip III M.D.   On: 01/17/2015 21:51    Scheduled Meds: . ALPRAZolam  0.5 mg Oral Daily  . aspirin EC  81 mg Oral Daily  . azithromycin  500 mg Oral Q24H  . carvedilol  6.25 mg Oral BID WC   . cefTRIAXone (ROCEPHIN)  IV  1 g Intravenous Q24H  . enoxaparin (LOVENOX) injection  40 mg Subcutaneous Q24H  . feeding supplement (ENSURE ENLIVE)  237 mL Oral BID BM  . hydrALAZINE  25 mg Oral BID  . [START ON 01/19/2015] Influenza vac split quadrivalent PF  0.5 mL Intramuscular Tomorrow-1000  . insulin aspart  0-5 Units Subcutaneous QHS  . insulin aspart  0-9 Units Subcutaneous TID WC  . lisinopril  40 mg Oral Daily  . pantoprazole  40 mg Oral Daily  . [START ON 01/19/2015] pneumococcal 23 valent vaccine  0.5 mL Intramuscular Tomorrow-1000  . potassium chloride  40 mEq Oral Q6H  . pyridOXINE  100 mg Oral Daily  . simvastatin  40 mg Oral q morning - 10a   Continuous Infusions:   Principal Problem:   Hip swelling Active Problems:   Anemia   HTN (hypertension)   DM2 (diabetes mellitus, type 2) (HCC)   Fever, unspecified    Time spent: 35 minutes    Kelvin Cellar  [facility] Pager [phone]. If 7PM-7AM, please contact night-coverage at [email or web address], password [facility] 01/18/2015, 9:16 AM

## 2015-01-19 DIAGNOSIS — M25452 Effusion, left hip: Secondary | ICD-10-CM | POA: Diagnosis not present

## 2015-01-19 DIAGNOSIS — I10 Essential (primary) hypertension: Secondary | ICD-10-CM | POA: Diagnosis not present

## 2015-01-19 DIAGNOSIS — J189 Pneumonia, unspecified organism: Secondary | ICD-10-CM | POA: Diagnosis not present

## 2015-01-19 LAB — CBC
HEMATOCRIT: 31.1 % — AB (ref 36.0–46.0)
HEMOGLOBIN: 10 g/dL — AB (ref 12.0–15.0)
MCH: 30.7 pg (ref 26.0–34.0)
MCHC: 32.2 g/dL (ref 30.0–36.0)
MCV: 95.4 fL (ref 78.0–100.0)
Platelets: 171 10*3/uL (ref 150–400)
RBC: 3.26 MIL/uL — AB (ref 3.87–5.11)
RDW: 14.5 % (ref 11.5–15.5)
WBC: 6.5 10*3/uL (ref 4.0–10.5)

## 2015-01-19 LAB — BASIC METABOLIC PANEL
ANION GAP: 6 (ref 5–15)
BUN: 14 mg/dL (ref 6–20)
CALCIUM: 8.9 mg/dL (ref 8.9–10.3)
CHLORIDE: 111 mmol/L (ref 101–111)
CO2: 26 mmol/L (ref 22–32)
Creatinine, Ser: 1.2 mg/dL — ABNORMAL HIGH (ref 0.44–1.00)
GFR calc non Af Amer: 42 mL/min — ABNORMAL LOW (ref >60)
GFR, EST AFRICAN AMERICAN: 48 mL/min — AB (ref >60)
Glucose, Bld: 71 mg/dL (ref 65–99)
POTASSIUM: 5 mmol/L (ref 3.5–5.1)
Sodium: 143 mmol/L (ref 135–145)

## 2015-01-19 LAB — GLUCOSE, CAPILLARY
GLUCOSE-CAPILLARY: 102 mg/dL — AB (ref 65–99)
GLUCOSE-CAPILLARY: 90 mg/dL (ref 65–99)

## 2015-01-19 MED ORDER — DOXYCYCLINE HYCLATE 50 MG PO CAPS: 50.0000 mg | ORAL_CAPSULE | Freq: Two times a day (BID) | ORAL | Status: DC

## 2015-01-19 MED ORDER — OXYCODONE HCL 5 MG PO TABS: 5.0000 mg | ORAL_TABLET | Freq: Four times a day (QID) | ORAL | Status: DC | PRN

## 2015-01-19 NOTE — Discharge Summary (Signed)
Physician Discharge Summary  [name] [id number] DOB: 05-Sep-1932 DOA: 01/17/2015  PCP: Elizabeth Palau, MD  Admit date: 01/17/2015 Discharge date: 01/19/2015  Time spent: 35 minutes  Recommendations for Outpatient Follow-up:  1. Patient presenting with left hip pain status post aspiration, seen by orthopedic surgery did not feel this represented an infection. 2. She was discharged to her home in stable condition with home health services for home PT.   Discharge Diagnoses:  Principal Problem:   Hip swelling Active Problems:   Anemia   HTN (hypertension)   DM2 (diabetes mellitus, type 2) (HCC)   Fever, unspecified   CAP (community acquired pneumonia)   Discharge Condition: Stable  Diet recommendation: Heart healthy  There were no vitals filed for this visit.  History of present illness:  [name] is a 82 y.o. female with a past medical history significant for NIDDM, alcohol abuse, HTN, and CKD III who presents with hip swelling.  The patient was in her normal state of health until about two days ago when her chronic lateral LEFT hip pain got worse, and she noticed a swelling in the left hip. This was severely painful, and so she came to the ER. Separately, the patient has had upper respiratory sypmtoms and productive cough since Christmas that are now worsening.   In the ED, she was noted to be febrile to 102F, with hypertension and mild tachycardia without hypoxia or leukocytosis. With regard to the leg, this was hard and bony appearing, but appeared to be soft tissue or even fluid on x-ray and CT (degraded by nearby prosthesis), wtihout overlying warmth, redness, or swelling. The patient is very clear this swelling was not present as recently as two days ago.  With regard to the lungs, she had basilar crackles and a chest x-ray that showed bilateral upper lobe streaky infiltrates.  Hospital Course:  [name] is a pleasant 82 year old female with a  history of left hip hemiarthroplasty performed in 2013 by Dr. Tamera Punt of orthopedic surgery, had reported having intermittent left hip pain since then however experienced severe left hip pain in the past 4 hours. In the emergency room found to have swelling over lateral aspect of hip. This was further worked up with a CT scan that showed possible fluid collection. Case was discussed with orthopedic surgery who will evaluate patient today.  1. Left hip pain. -Patient having a history of left hemiarthroplasty in 2013 -Reported having a fall without injury to hip about 10 days ago. -Pain becoming severe over the past 24 hours. On initial evaluation found to have swelling over lateral aspect of hip. She was further worked up with a CT scan that revealed a fluid collection lateral to greater trochanter -Orthopedic surgery was consulted as patient was seen and evaluated by Dr. Berenice Primas. She underwent aspiration of mass over lateral aspect of left hip with removal of 50 mL's of frank blood. Orthopedic surgery did not feel this represented infection and recommended 2 week follow-up in the office to assure resolution.  2. Hypertension. -She had elevated blood pressures overnight I suspect secondary to left hip pain. Blood pressures coming down this morning. -Plan to discharge on Coreg 6.25 mg by mouth twice a day, hydralazine 25 mg by mouth twice a day, lisinopril 40 mg by mouth daily  3. Dyslipidemia. -Continue simvastatin 40 mg by mouth daily  4. Possible community-acquired pneumonia versus bronchitis. -Patient developing cough, chest x-ray showed interstitial thickening which could be consistent with atypical infection -She was started on  empiric IV and microbial therapy with ceftriaxone and azithromycin -Streptococcus pneumonia urinary antigen positive. Will discharge on 5 days of doxycycline 100 mg by mouth twice a day for a total of 7 days of antibiotic treatment.   Procedures:  Status post  aspiration of left hip, procedure performed by Dr. Berenice Primas  Consultations:  Orthopedic surgery  Discharge Exam: Filed Vitals:   01/18/15 2103 01/19/15 0613  BP: 145/64 156/64  Pulse: 83 83  Temp: 98.6 F (37 C) 99.2 F (37.3 C)  Resp: 16 16    General: Patient is nontoxic appearing, awake alert no acute distress Cardiovascular: Regular rate and rhythm normal S1-S2 no murmurs or gallops Respiratory: Patient having scattered bilateral rhonchi, breathing comfortably on room air Abdomen: Soft nontender nondistended Extremities: Patient having some swelling over her left hip region although there may be some improvement today.  Discharge Instructions   Discharge Instructions    Call MD for:  difficulty breathing, headache or visual disturbances    Complete by:  As directed      Call MD for:  extreme fatigue    Complete by:  As directed      Call MD for:  hives    Complete by:  As directed      Call MD for:  persistant dizziness or light-headedness    Complete by:  As directed      Call MD for:  persistant nausea and vomiting    Complete by:  As directed      Call MD for:  redness, tenderness, or signs of infection (pain, swelling, redness, odor or green/yellow discharge around incision site)    Complete by:  As directed      Call MD for:  severe uncontrolled pain    Complete by:  As directed      Call MD for:  temperature >100.4    Complete by:  As directed      Call MD for:    Complete by:  As directed      Diet - low sodium heart healthy    Complete by:  As directed      Increase activity slowly    Complete by:  As directed           Current Discharge Medication List    START taking these medications   Details  oxyCODONE (OXY IR/ROXICODONE) 5 MG immediate release tablet Take 1 tablet (5 mg total) by mouth every 6 (six) hours as needed for moderate pain. Qty: 10 tablet, Refills: 0      CONTINUE these medications which have NOT CHANGED   Details  ALPRAZolam  (XANAX) 0.5 MG tablet Take 0.5 mg by mouth daily.    aspirin 81 MG tablet Take 81 mg by mouth daily.    carvedilol (COREG) 6.25 MG tablet Take 1 tablet (6.25 mg total) by mouth 2 (two) times daily with a meal. Qty: 60 tablet, Refills: 0   Associated Diagnoses: Hypertension    DM-APAP-CPM (CORICIDIN HBP FLU PO) Take 30 mLs by mouth 2 (two) times daily. Patient states she takes every day per patient    furosemide (LASIX) 20 MG tablet take 1 tablet by mouth once daily Qty: 30 tablet, Refills: 11    hydrALAZINE (APRESOLINE) 25 MG tablet Take 25 mg by mouth 2 (two) times daily. Refills: 0    lisinopril (PRINIVIL,ZESTRIL) 40 MG tablet Take 40 mg by mouth daily.    Multiple Vitamin (MULTIVITAMIN WITH MINERALS) TABS tablet Take 1 tablet by mouth daily.  omeprazole (PRILOSEC) 20 MG capsule Take 20 mg by mouth daily.     pyridOXINE (VITAMIN B-6) 100 MG tablet Take 100 mg by mouth daily.    simvastatin (ZOCOR) 40 MG tablet Take 40 mg by mouth every morning.     TRAVATAN Z 0.004 % SOLN ophthalmic solution Place 1 drop into the right eye at bedtime.        No Known Allergies Follow-up Information    Follow up with GRAVES,JOHN L, MD In 1 week.   Specialty:  Orthopedic Surgery   Contact information:   [address] [place] [address] [phone]        The results of significant diagnostics from this hospitalization (including imaging, microbiology, ancillary and laboratory) are listed below for reference.    Significant Diagnostic Studies: Dg Chest 1 View  01/17/2015  CLINICAL DATA:  Cough for 1 week. EXAM: CHEST 1 VIEW COMPARISON:  10/23/2014, and 02/17/2014 FINDINGS: Stable cardiomegaly and tortuous thoracic aorta. Interstitial thickening in the upper lobes is unchanged from most recent exam, however may be progressed from more remote exam of February 2016. No confluent airspace disease. No pulmonary edema, large pleural effusion or pneumothorax. IMPRESSION: Interstitial  thickening in the upper lobes is unchanged from most recent exam, however likely progressed from more remote exam. This may reflect chronic interstitial change, however atypical infection could have a similar appearance. Electronically Signed   By: Jeb Levering M.D.   On: 01/17/2015 21:53   Ct Hip Left W Contrast  01/18/2015  CLINICAL DATA:  Left hip pain and swelling for 12 hours. No known injury. EXAM: CT OF THE LEFT HIP WITH CONTRAST TECHNIQUE: Multidetector CT imaging was performed following the standard protocol during bolus administration of intravenous contrast. Patient returned for additional imaging utilizing metal reduction technique. CONTRAST:  175mL OMNIPAQUE IOHEXOL 300 MG/ML  SOLN COMPARISON:  Pelvis and hip radiographs earlier this day. CT abdomen/pelvis 07/10/2014 FINDINGS: Post left hip arthroplasty with surrounding streak artifact partially obscuring soft tissues. Adjacent the lateral aspect of the trochanteric portion is a fluid collection just lateral to the greater trochanter. This measures approximately 5.4 x 4.9 cm. This may be within or subjacent to the gluteal musculature. This corresponds to the abnormality on radiograph. There is mild adjacent soft tissue stranding about the inferior aspect. No periprosthetic lucencies are seen. No fracture no periosteal reaction or bony destructive change. Large left adnexal dermoid is only partially included. 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 IMPRESSION: 1. Left hip arthroplasty in place. There is an adjacent fluid collection adjacent to the greater trochanter, either within or deep to the gluteus musculature, measuring approximately 5 cm. Detailed fluid collection characterization is limited secondary to adjacent streak artifact. Hematoma or abscess are considered. This appears to represent fluid density, rather than soft tissue mass, however continued clinical follow-up and clinical correlation  recommended. 2. 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 Electronically Signed   By: Jeb Levering M.D.   On: 01/18/2015 01:32   [place] Misc Soft Tissue  01/18/2015  CLINICAL DATA:  Soft tissue swelling around the left hip. EXAM: ULTRASOUND left LOWER EXTREMITY LIMITED TECHNIQUE: Ultrasound examination of the lower extremity soft tissues was performed in the area of clinical concern. COMPARISON:  None. FINDINGS: Two large areas of heterogeneous echogenicity appearing partially cystic and solid. The more posterior lesion measures 5.2 x 1.9 x 5.1 cm and the more anterior  lesion measures 5.7 x 2.1 x 5.1 cm. These are most likely hematomas. Recommend clinical followup and reimaging only if these did not resolve as they should. IMPRESSION: Two large complex fluid collections likely representing hematomas around the left hip. Electronically Signed   By: Marijo Sanes M.D.   On: 01/18/2015 19:47   Dg Hip Unilat With Pelvis 2-3 Views Left  01/17/2015  CLINICAL DATA:  Pain and swelling left hip region EXAM: DG HIP (WITH OR WITHOUT PELVIS) 2-3V LEFT COMPARISON:  July 17, 2011 FINDINGS: Frontal pelvis as well as frontal and lateral left hip images were obtained. There is marked soft tissue swelling/fullness lateral to the left proximal femur. There is a total hip prosthesis on the left with prosthetic components well-seated. There is no acute fracture or dislocation. There is slight narrowing of the right hip joint. No erosive changes. There is extensive vascular calcification. IMPRESSION: Extensive soft tissue swelling/fullness lateral to the proximal left femur. Etiology uncertain. Mass or inflammatory/infectious lesion cannot be excluded radiographically. There is no obvious air or calcification within this region of swelling/soft tissue fullness. There is a total hip prosthesis on the left with prosthetic components well-seated. No acute  fracture or dislocation. There is extensive arterial vascular calcification. Electronically Signed   By: Lowella Grip III M.D.   On: 01/17/2015 21:51    Microbiology: Recent Results (from the past 240 hour(s))  Culture, body fluid-bottle     Status: None (Preliminary result)   Collection Time: 01/18/15  6:17 PM  Result Value Ref Range Status   Specimen Description SYNOVIAL LEFT HIP  Final   Special Requests BOTTLES DRAWN AEROBIC AND ANAEROBIC 10CC  Final   Culture PENDING  Incomplete   Report Status PENDING  Incomplete  Gram stain     Status: None   Collection Time: 01/18/15  6:17 PM  Result Value Ref Range Status   Specimen Description SYNOVIAL LEFT HIP  Final   Special Requests NONE  Final   Gram Stain   Final    ABUNDANT WBC PRESENT,BOTH PMN AND MONONUCLEAR NO ORGANISMS SEEN    Report Status 01/18/2015 FINAL  Final     Labs: Basic Metabolic Panel:  Recent Labs Lab 01/17/15 2109 01/18/15 0446 01/19/15 0347  NA 136 139 143  K 3.6 2.9* 5.0  CL 101 104 111  CO2 24 27 26   GLUCOSE 122* 93 71  BUN 14 10 14   CREATININE 1.28* 1.19* 1.20*  CALCIUM 9.1 8.7* 8.9   Liver Function Tests: No results for input(s): AST, ALT, ALKPHOS, BILITOT, PROT, ALBUMIN in the last 168 hours. No results for input(s): LIPASE, AMYLASE in the last 168 hours. No results for input(s): AMMONIA in the last 168 hours. CBC:  Recent Labs Lab 01/17/15 2109 01/18/15 0446 01/19/15 0347  WBC 6.8 6.3 6.5  NEUTROABS 4.9  --   --   HGB 11.6* 10.3* 10.0*  HCT 34.3* 31.2* 31.1*  MCV 92.5 92.9 95.4  PLT 170 176 171   Cardiac Enzymes: No results for input(s): CKTOTAL, CKMB, CKMBINDEX, TROPONINI in the last 168 hours. BNP: BNP (last 3 results)  Recent Labs  02/17/14 1255  BNP 322.7*    ProBNP (last 3 results) No results for input(s): PROBNP in the last 8760 hours.  CBG:  Recent Labs Lab 01/18/15 1111 01/18/15 1600 01/18/15 2107 01/19/15 0617  GLUCAP 67 99 90 102*        Signed:  Kelvin Cellar MD  FACP  [facility] 01/19/2015, 10:32 AM

## 2015-01-19 NOTE — Progress Notes (Signed)
Patient discharged home with family member. Discharge instructions given. Prescriptions given.

## 2015-01-19 NOTE — Consult Note (Signed)
Reason for Consult:left hip pain with mass and history of fever Referring Physician: hospitalists  [name] is an 82 y.o. female.  HPI: the patient is an 82 year old female who underwent hemiarthroplasty left hip several years ago.  She was admitted with history of fever and mass over the left hip.  She had had a recent fall and has had this mass since that time.  She states that she has pain over the lateral hip.  She denies numbness tingling or radiating pain down the leg.  She's not had this mass previously.  She was admitted to the medical service for workup and we were consulted.  Past Medical History  Diagnosis Date  . Anemia 04/03/2011  . Hypertension   . High cholesterol   . Type II diabetes mellitus ([place])     not taking any medication  . Anginal pain ([place])   . Exertional dyspnea   . GERD (gastroesophageal reflux disease)   . Tongue cancer ([place]) 1998    S/P radiation  . Depression   . 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 Blind left eye 1999  . Hip fracture ([place]) 07/17/11    fall from 1-2 feet; left    Past Surgical History  Procedure Laterality Date  . Tubal ligation  1970's  . Tongue biopsy  1998    "cancer"  . Intraocular prostheses insertion  1999    left  . Hip arthroplasty  07/18/2011    Procedure: ARTHROPLASTY BIPOLAR HIP;  Surgeon: Nita Sells, MD;  Location: [facility];  Service: Orthopedics;  Laterality: Left;    Family History  Problem Relation Age of Onset  . Diabetes type II Mother   . Diabetes type II Father   . Diabetes type II Sister   . Colon cancer Neg Hx     Social History:  reports that she quit smoking about 17 years ago. Her smoking use included Cigarettes. She has a 70.5 pack-year smoking history. She has never used smokeless tobacco. She reports that she drinks about 10.2 oz of alcohol per week. She reports that she does not use illicit drugs.  Allergies: No Known Allergies  Medications: I have reviewed  the patient's current medications.  Results for orders placed or performed during the hospital encounter of 01/17/15 (from the past 48 hour(s))  Basic metabolic panel     Status: Abnormal   Collection Time: 01/17/15  9:09 PM  Result Value Ref Range   Sodium 136 135 - 145 mmol/L   Potassium 3.6 3.5 - 5.1 mmol/L   Chloride 101 101 - 111 mmol/L   CO2 24 22 - 32 mmol/L   Glucose, Bld 122 (H) 65 - 99 mg/dL   BUN 14 6 - 20 mg/dL   Creatinine, Ser 1.28 (H) 0.44 - 1.00 mg/dL   Calcium 9.1 8.9 - 10.3 mg/dL   GFR calc non Af Amer 38 (L) >60 mL/min   GFR calc Af Amer 45 (L) >60 mL/min    Comment: (NOTE) The eGFR has been calculated using the CKD EPI equation. This calculation has not been validated in all clinical situations. eGFR's persistently <60 mL/min signify possible Chronic Kidney Disease.    Anion gap 11 5 - 15  CBC with Differential     Status: Abnormal   Collection Time: 01/17/15  9:09 PM  Result Value Ref Range   WBC 6.8 4.0 - 10.5 K/uL   RBC 3.71 (L) 3.87 - 5.11 MIL/uL  Hemoglobin 11.6 (L) 12.0 - 15.0 g/dL   HCT 34.3 (L) 36.0 - 46.0 %   MCV 92.5 78.0 - 100.0 fL   MCH 31.3 26.0 - 34.0 pg   MCHC 33.8 30.0 - 36.0 g/dL   RDW 14.1 11.5 - 15.5 %   Platelets 170 150 - 400 K/uL   Neutrophils Relative % 72 %   Neutro Abs 4.9 1.7 - 7.7 K/uL   Lymphocytes Relative 19 %   Lymphs Abs 1.3 0.7 - 4.0 K/uL   Monocytes Relative 7 %   Monocytes Absolute 0.5 0.1 - 1.0 K/uL   Eosinophils Relative 2 %   Eosinophils Absolute 0.1 0.0 - 0.7 K/uL   Basophils Relative 0 %   Basophils Absolute 0.0 0.0 - 0.1 K/uL  Urinalysis, Routine w reflex microscopic     Status: Abnormal   Collection Time: 01/17/15 10:30 PM  Result Value Ref Range   Color, Urine YELLOW YELLOW   APPearance CLEAR CLEAR   Specific Gravity, Urine 1.011 1.005 - 1.030   pH 5.5 5.0 - 8.0   Glucose, UA NEGATIVE NEGATIVE mg/dL   Hgb urine dipstick NEGATIVE NEGATIVE   Bilirubin Urine NEGATIVE NEGATIVE   Ketones, ur NEGATIVE  NEGATIVE mg/dL   Protein, ur NEGATIVE NEGATIVE mg/dL   Nitrite NEGATIVE NEGATIVE   Leukocytes, UA TRACE (A) NEGATIVE  Urine microscopic-add on     Status: Abnormal   Collection Time: 01/17/15 10:30 PM  Result Value Ref Range   Squamous Epithelial / LPF 0-5 (A) NONE SEEN   WBC, UA 0-5 0 - 5 WBC/hpf   RBC / HPF NONE SEEN 0 - 5 RBC/hpf   Bacteria, UA RARE (A) NONE SEEN  HIV antibody     Status: None   Collection Time: 01/18/15  4:46 AM  Result Value Ref Range   HIV Screen 4th Generation wRfx Non Reactive Non Reactive    Comment: (NOTE) Performed At: [facility] [place], [place] [id number] Lindon Romp MD [id number]   Procalcitonin - Baseline     Status: None   Collection Time: 01/18/15  4:46 AM  Result Value Ref Range   Procalcitonin 0.11 ng/mL    Comment:        Interpretation: PCT (Procalcitonin) <= 0.5 ng/mL: Systemic infection (sepsis) is not likely. Local bacterial infection is possible. (NOTE)         ICU PCT Algorithm               Non ICU PCT Algorithm    ----------------------------     ------------------------------         PCT < 0.25 ng/mL                 PCT < 0.1 ng/mL     Stopping of antibiotics            Stopping of antibiotics       strongly encouraged.               strongly encouraged.    ----------------------------     ------------------------------       PCT level decrease by               PCT < 0.25 ng/mL       >= 80% from peak PCT       OR PCT 0.25 - 0.5 ng/mL          Stopping of antibiotics  encouraged.     Stopping of antibiotics           encouraged.    ----------------------------     ------------------------------       PCT level decrease by              PCT >= 0.25 ng/mL       < 80% from peak PCT        AND PCT >= 0.5 ng/mL            Continuin g antibiotics                                              encouraged.       Continuing antibiotics            encouraged.     ----------------------------     ------------------------------     PCT level increase compared          PCT > 0.5 ng/mL         with peak PCT AND          PCT >= 0.5 ng/mL             Escalation of antibiotics                                          strongly encouraged.      Escalation of antibiotics        strongly encouraged.   CBC     Status: Abnormal   Collection Time: 01/18/15  4:46 AM  Result Value Ref Range   WBC 6.3 4.0 - 10.5 K/uL   RBC 3.36 (L) 3.87 - 5.11 MIL/uL   Hemoglobin 10.3 (L) 12.0 - 15.0 g/dL   HCT 31.2 (L) 36.0 - 46.0 %   MCV 92.9 78.0 - 100.0 fL   MCH 30.7 26.0 - 34.0 pg   MCHC 33.0 30.0 - 36.0 g/dL   RDW 14.2 11.5 - 15.5 %   Platelets 176 150 - 400 K/uL  Basic metabolic panel     Status: Abnormal   Collection Time: 01/18/15  4:46 AM  Result Value Ref Range   Sodium 139 135 - 145 mmol/L   Potassium 2.9 (L) 3.5 - 5.1 mmol/L    Comment: DELTA CHECK NOTED   Chloride 104 101 - 111 mmol/L   CO2 27 22 - 32 mmol/L   Glucose, Bld 93 65 - 99 mg/dL   BUN 10 6 - 20 mg/dL   Creatinine, Ser 1.19 (H) 0.44 - 1.00 mg/dL   Calcium 8.7 (L) 8.9 - 10.3 mg/dL   GFR calc non Af Amer 42 (L) >60 mL/min   GFR calc Af Amer 49 (L) >60 mL/min    Comment: (NOTE) The eGFR has been calculated using the CKD EPI equation. This calculation has not been validated in all clinical situations. eGFR's persistently <60 mL/min signify possible Chronic Kidney Disease.    Anion gap 8 5 - 15  Protime-INR     Status: None   Collection Time: 01/18/15  4:46 AM  Result Value Ref Range   Prothrombin Time 15.1 11.6 - 15.2 seconds   INR 1.17 0.00 - 1.49  Strep pneumoniae urinary antigen     Status: Abnormal   Collection  Time: 01/18/15  9:59 AM  Result Value Ref Range   Strep Pneumo Urinary Antigen POSITIVE (A) NEGATIVE  Glucose, capillary     Status: None   Collection Time: 01/18/15 11:11 AM  Result Value Ref Range   Glucose-Capillary 67 65 - 99 mg/dL   Comment 1 Notify RN   Glucose,  capillary     Status: None   Collection Time: 01/18/15  4:00 PM  Result Value Ref Range   Glucose-Capillary 99 65 - 99 mg/dL   Comment 1 Notify RN   Culture, body fluid-bottle     Status: None (Preliminary result)   Collection Time: 01/18/15  6:17 PM  Result Value Ref Range   Specimen Description SYNOVIAL LEFT HIP    Special Requests BOTTLES DRAWN AEROBIC AND ANAEROBIC 10CC    Culture PENDING    Report Status PENDING   Gram stain     Status: None   Collection Time: 01/18/15  6:17 PM  Result Value Ref Range   Specimen Description SYNOVIAL LEFT HIP    Special Requests NONE    Gram Stain      ABUNDANT WBC PRESENT,BOTH PMN AND MONONUCLEAR NO ORGANISMS SEEN    Report Status 01/18/2015 FINAL   Glucose, capillary     Status: None   Collection Time: 01/18/15  9:07 PM  Result Value Ref Range   Glucose-Capillary 90 65 - 99 mg/dL  Basic metabolic panel     Status: Abnormal   Collection Time: 01/19/15  3:47 AM  Result Value Ref Range   Sodium 143 135 - 145 mmol/L   Potassium 5.0 3.5 - 5.1 mmol/L    Comment: DELTA CHECK NOTED NO VISIBLE HEMOLYSIS    Chloride 111 101 - 111 mmol/L   CO2 26 22 - 32 mmol/L   Glucose, Bld 71 65 - 99 mg/dL   BUN 14 6 - 20 mg/dL   Creatinine, Ser 1.20 (H) 0.44 - 1.00 mg/dL   Calcium 8.9 8.9 - 10.3 mg/dL   GFR calc non Af Amer 42 (L) >60 mL/min   GFR calc Af Amer 48 (L) >60 mL/min    Comment: (NOTE) The eGFR has been calculated using the CKD EPI equation. This calculation has not been validated in all clinical situations. eGFR's persistently <60 mL/min signify possible Chronic Kidney Disease.    Anion gap 6 5 - 15  CBC     Status: Abnormal   Collection Time: 01/19/15  3:47 AM  Result Value Ref Range   WBC 6.5 4.0 - 10.5 K/uL   RBC 3.26 (L) 3.87 - 5.11 MIL/uL   Hemoglobin 10.0 (L) 12.0 - 15.0 g/dL   HCT 31.1 (L) 36.0 - 46.0 %   MCV 95.4 78.0 - 100.0 fL   MCH 30.7 26.0 - 34.0 pg   MCHC 32.2 30.0 - 36.0 g/dL   RDW 14.5 11.5 - 15.5 %   Platelets  171 150 - 400 K/uL  Glucose, capillary     Status: Abnormal   Collection Time: 01/19/15  6:17 AM  Result Value Ref Range   Glucose-Capillary 102 (H) 65 - 99 mg/dL    Dg Chest 1 View  01/17/2015  CLINICAL DATA:  Cough for 1 week. EXAM: CHEST 1 VIEW COMPARISON:  10/23/2014, and 02/17/2014 FINDINGS: Stable cardiomegaly and tortuous thoracic aorta. Interstitial thickening in the upper lobes is unchanged from most recent exam, however may be progressed from more remote exam of February 2016. No confluent airspace disease. No pulmonary edema, large pleural effusion or  pneumothorax. IMPRESSION: Interstitial thickening in the upper lobes is unchanged from most recent exam, however likely progressed from more remote exam. This may reflect chronic interstitial change, however atypical infection could have a similar appearance. Electronically Signed   By: Jeb Levering M.D.   On: 01/17/2015 21:53   Ct Hip Left W Contrast  01/18/2015  CLINICAL DATA:  Left hip pain and swelling for 12 hours. No known injury. EXAM: CT OF THE LEFT HIP WITH CONTRAST TECHNIQUE: Multidetector CT imaging was performed following the standard protocol during bolus administration of intravenous contrast. Patient returned for additional imaging utilizing metal reduction technique. CONTRAST:  114m OMNIPAQUE IOHEXOL 300 MG/ML  SOLN COMPARISON:  Pelvis and hip radiographs earlier this day. CT abdomen/pelvis 07/10/2014 FINDINGS: Post left hip arthroplasty with surrounding streak artifact partially obscuring soft tissues. Adjacent the lateral aspect of the trochanteric portion is a fluid collection just lateral to the greater trochanter. This measures approximately 5.4 x 4.9 cm. This may be within or subjacent to the gluteal musculature. This corresponds to the abnormality on radiograph. There is mild adjacent soft tissue stranding about the inferior aspect. No periprosthetic lucencies are seen. No fracture no periosteal reaction or bony  destructive change. Large left adnexal dermoid is only partially included. 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 IMPRESSION: 1. Left hip arthroplasty in place. There is an adjacent fluid collection adjacent to the greater trochanter, either within or deep to the gluteus musculature, measuring approximately 5 cm. Detailed fluid collection characterization is limited secondary to adjacent streak artifact. Hematoma or abscess are considered. This appears to represent fluid density, rather than soft tissue mass, however continued clinical follow-up and clinical correlation recommended. 2. 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 Electronically Signed   By: MJeb LeveringM.D.   On: 01/18/2015 01:32   [place] Soft Tissue  01/18/2015  CLINICAL DATA:  Soft tissue swelling around the left hip. EXAM: ULTRASOUND left LOWER EXTREMITY LIMITED TECHNIQUE: Ultrasound examination of the lower extremity soft tissues was performed in the area of clinical concern. COMPARISON:  None. FINDINGS: Two large areas of heterogeneous echogenicity appearing partially cystic and solid. The more posterior lesion measures 5.2 x 1.9 x 5.1 cm and the more anterior lesion measures 5.7 x 2.1 x 5.1 cm. These are most likely hematomas. Recommend clinical followup and reimaging only if these did not resolve as they should. IMPRESSION: Two large complex fluid collections likely representing hematomas around the left hip. Electronically Signed   By: PMarijo SanesM.D.   On: 01/18/2015 19:47   Dg Hip Unilat With Pelvis 2-3 Views Left  01/17/2015  CLINICAL DATA:  Pain and swelling left hip region EXAM: DG HIP (WITH OR WITHOUT PELVIS) 2-3V LEFT COMPARISON:  July 17, 2011 FINDINGS: Frontal pelvis as well as frontal and lateral left hip images were obtained. There is marked soft tissue swelling/fullness lateral to the left proximal femur.  There is a total hip prosthesis on the left with prosthetic components well-seated. There is no acute fracture or dislocation. There is slight narrowing of the right hip joint. No erosive changes. There is extensive vascular calcification. IMPRESSION: Extensive soft tissue swelling/fullness lateral to the proximal left femur. Etiology uncertain. Mass or inflammatory/infectious lesion cannot be excluded radiographically. There is no obvious air or calcification within this region of swelling/soft tissue fullness. There is a total hip prosthesis on the left with prosthetic components well-seated.  No acute fracture or dislocation. There is extensive arterial vascular calcification. Electronically Signed   By: Lowella Grip III M.D.   On: 01/17/2015 21:51    ROS  ROS: I have reviewed the patient's review of systems thoroughly and there are no positive responses as relates to the HPI. Blood pressure 156/64, pulse 83, temperature 99.2 F (37.3 C), temperature source Oral, resp. rate 16, SpO2 97 %. Physical Exam Well-developed well-nourished patient in no acute distress. Alert and oriented x3 HEENT:within normal limits Cardiac: Regular rate and rhythm Pulmonary: Lungs clear to auscultation Abdomen: Soft and nontender.  Normal active bowel sounds  Musculoskeletal: (left hip: Subcutaneous mass which feels somewhat fluctuant.  Mild pain with range of motion.  Mild tenderness to palpation.  No warmthor erythema over the lateral hip. Assessment/Plan: 82 year old female with history of hemiarthroplasty left hip several years ago with new onset mass left hip and concern for temperature elevation.  The patient has been admitted and placed on IV antibiotic therapy.  She has had a CAT scan which shows mass over the lateral hip which appears to be a complex fluid mass.//  As outlined in previous progress note after thorough discussion and rhythm consent was elected to aspirate the mass over the lateral aspect  of the hip under sterile conditions.  50 cc of frank blood was aspirated and was sent to the lab for evaluation.  We will follow her in the hospital.  I do not think this is an infection.  I do not think she has limitations of motion.  She should followup in 2 weeks in our office to make sure that this has resolved.  Eleno Weimar L 01/19/2015, 6:49 AM

## 2015-01-19 NOTE — Care Management Note (Signed)
Case Management Note  Patient Details  Name: [name] MRN: [id number] Date of Birth: Aug 25, 1932  Subjective/Objective:  82 yr old female who was admitted with left hip pain, underwent aspiration of left hip.                  Action/Plan: Case manager spoke with patient concerning home health needs at discharge. Choice was offered. Referral was called to [facility] to home specialist . Patient states she is independent at home, lives alone and has no DME needs.    Expected Discharge Date:   01/19/15               Expected Discharge Plan:  [place]  In-House Referral:     Discharge planning Services  CM Consult  Post Acute Care Choice:  Home Health Choice offered to:     DME Arranged:  N/A DME Agency:  NA  HH Arranged:  PT [place] Agency:  [place]  Status of Service:  Completed, signed off  Medicare Important Message Given:    Date Medicare IM Given:    Medicare IM give by:    Date Additional Medicare IM Given:    Additional Medicare Important Message give by:     If discussed at [place] of Stay Meetings, dates discussed:    Additional Comments:  Ninfa Meeker, RN 01/19/2015, 12:51 PM

## 2015-01-23 LAB — CULTURE, BODY FLUID-BOTTLE: CULTURE: NO GROWTH

## 2015-01-23 LAB — CULTURE, BODY FLUID W GRAM STAIN -BOTTLE

## 2015-01-26 ENCOUNTER — Other Ambulatory Visit: Payer: Self-pay

## 2015-01-26 MED ORDER — OMEPRAZOLE 20 MG PO CPDR: 20.0000 mg | DELAYED_RELEASE_CAPSULE | Freq: Every day | ORAL | Status: DC

## 2015-05-03 ENCOUNTER — Other Ambulatory Visit: Payer: Self-pay | Admitting: *Deleted

## 2015-05-03 MED ORDER — SIMVASTATIN 40 MG PO TABS: 40.0000 mg | ORAL_TABLET | Freq: Every morning | ORAL | Status: DC

## 2015-05-16 DIAGNOSIS — H401121 Primary open-angle glaucoma, left eye, mild stage: Secondary | ICD-10-CM | POA: Diagnosis not present

## 2015-05-16 DIAGNOSIS — H2511 Age-related nuclear cataract, right eye: Secondary | ICD-10-CM | POA: Diagnosis not present

## 2015-05-16 DIAGNOSIS — E113293 Type 2 diabetes mellitus with mild nonproliferative diabetic retinopathy without macular edema, bilateral: Secondary | ICD-10-CM | POA: Diagnosis not present

## 2015-05-30 ENCOUNTER — Other Ambulatory Visit: Payer: Self-pay | Admitting: Interventional Cardiology

## 2015-05-30 DIAGNOSIS — I209 Angina pectoris, unspecified: Secondary | ICD-10-CM | POA: Diagnosis not present

## 2015-05-30 DIAGNOSIS — I10 Essential (primary) hypertension: Secondary | ICD-10-CM | POA: Diagnosis not present

## 2015-05-30 MED ORDER — OMEPRAZOLE 20 MG PO CPDR: 20.0000 mg | DELAYED_RELEASE_CAPSULE | Freq: Every day | ORAL | Status: DC

## 2015-06-26 DIAGNOSIS — I10 Essential (primary) hypertension: Secondary | ICD-10-CM | POA: Diagnosis not present

## 2015-06-26 DIAGNOSIS — N183 Chronic kidney disease, stage 3 (moderate): Secondary | ICD-10-CM | POA: Diagnosis not present

## 2015-07-09 DIAGNOSIS — H60332 Swimmer's ear, left ear: Secondary | ICD-10-CM | POA: Diagnosis not present

## 2015-07-09 DIAGNOSIS — H6122 Impacted cerumen, left ear: Secondary | ICD-10-CM | POA: Diagnosis not present

## 2015-07-09 DIAGNOSIS — R490 Dysphonia: Secondary | ICD-10-CM | POA: Diagnosis not present

## 2015-07-16 ENCOUNTER — Other Ambulatory Visit: Payer: Self-pay | Admitting: *Deleted

## 2015-07-16 MED ORDER — FUROSEMIDE 20 MG PO TABS: ORAL_TABLET | ORAL | Status: DC

## 2015-10-10 NOTE — Progress Notes (Signed)
Cardiology Office Note    Date:  10/12/2015   ID:  [name], DOB February 23, 1932, MRN [id number]  PCP:  Kayla Palau, MD (Inactive)  Cardiologist:  Dr. Tamala Julian   CC: chest pain   History of Present Illness:  [name] is a 82 y.o. female with a history of history of gum cancer s/p radiation in [place] (1990s) HTN, HLD, DMT2, CKD and chronic diastolic CHF who presents to clinic for evaluation of chest pain.  She was admitted to [facility] in 02/2014 for chest pain and ruled out for MI. An outpatient myocardial perfusion study was low risk. LV function was noted to be normal.   She was admitted in 01/2015 for fevers, PNA and hematoma of left hip after a fall.   She was last seen by Dr. Tamala Julian in 03/2014 for follow up and was doing well at that time.   Today she presents to clinic for follow up. Since March she has been having chest pain. She has been having chest tightness that is worse with exertion. She has also been cough. She smoked about 15-20 years a pack a day but quit 15 years ago. She also gets significant SOB. Chest tightness associated with diaphoresis but no nausea. More dyspnea on exertion. Chest pain does seem to improve with rest. She has decreased energy and fatigue. She lives alone and does all of her own house work. Currently have chest tightness right now. She has some dizziness as well but no syncope. No Le edema, sleeps on 2 pillows chronically and has some PND occasionally. No pain her legs with walking but sometimes gets cramps.   She thinks her mother may have died of CHF but not sure. Sister died of a heart attack in her 66s. Brother died of throat cancer.    Past Medical History:  Diagnosis Date  . Anemia 04/03/2011  . Anginal pain ([place])   . Blind left eye 1999  . Depression   . Exertional dyspnea   . GERD (gastroesophageal reflux disease)   . High cholesterol   . Hip fracture ([place]) 07/17/11   fall from 1-2 feet; left  . Hypertension   . Prosthetic eye  globe 1999   "from diabetic; left eye"  . Tongue cancer ([place]) 1998   S/P radiation  . Type II diabetes mellitus ([place])    not taking any medication    Past Surgical History:  Procedure Laterality Date  . HIP ARTHROPLASTY  07/18/2011   Procedure: ARTHROPLASTY BIPOLAR HIP;  Surgeon: Nita Sells, MD;  Location: [place];  Service: Orthopedics;  Laterality: Left;  . INTRAOCULAR PROSTHESES INSERTION  1999   left  . TONGUE BIOPSY  1998   "cancer"  . TUBAL LIGATION  1970's    Current Medications: Outpatient Medications Prior to Visit  Medication Sig Dispense Refill  . aspirin 81 MG tablet Take 81 mg by mouth daily.    . furosemide (LASIX) 20 MG tablet take 1 tablet by mouth once daily 30 tablet 5  . lisinopril (PRINIVIL,ZESTRIL) 40 MG tablet Take 40 mg by mouth daily.    Marland Kitchen omeprazole (PRILOSEC) 20 MG capsule Take 1 capsule (20 mg total) by mouth daily. 30 capsule 10  . simvastatin (ZOCOR) 40 MG tablet Take 1 tablet (40 mg total) by mouth every morning. 30 tablet 0  . TRAVATAN Z 0.004 % SOLN ophthalmic solution Place 1 drop into the right eye at bedtime.     . carvedilol (COREG) 6.25 MG tablet Take 1  tablet (6.25 mg total) by mouth 2 (two) times daily with a meal. 60 tablet 0  . ALPRAZolam (XANAX) 0.5 MG tablet Take 0.5 mg by mouth daily.    Marland Kitchen DM-APAP-CPM (CORICIDIN HBP FLU PO) Take 30 mLs by mouth 2 (two) times daily. Patient states she takes every day per patient    . doxycycline (VIBRAMYCIN) 50 MG capsule Take 1 capsule (50 mg total) by mouth 2 (two) times daily. (Patient not taking: Reported on 10/12/2015) 10 capsule 0  . hydrALAZINE (APRESOLINE) 25 MG tablet Take 25 mg by mouth 2 (two) times daily.  0  . Multiple Vitamin (MULTIVITAMIN WITH MINERALS) TABS tablet Take 1 tablet by mouth daily.    Marland Kitchen oxyCODONE (OXY IR/ROXICODONE) 5 MG immediate release tablet Take 1 tablet (5 mg total) by mouth every 6 (six) hours as needed for moderate pain. (Patient not taking: Reported on 10/12/2015)  10 tablet 0  . pyridOXINE (VITAMIN B-6) 100 MG tablet Take 100 mg by mouth daily.     No facility-administered medications prior to visit.      Allergies:   Review of patient's allergies indicates no known allergies.   Social History   Social History  . Marital status: Single    Spouse name: N/A  . Number of children: N/A  . Years of education: N/A   Social History Main Topics  . Smoking status: Former Smoker    Packs/day: 1.50    Years: 47.00    Types: Cigarettes    Quit date: 09/13/1997  . Smokeless tobacco: Never Used  . Alcohol use 10.2 oz/week    17 Shots of liquor per week     Comment: 07/17/11 "1/5 rum q weekend"  . Drug use: No     Comment: 07/17/11 "last drug use ~ 20 yr ago"  . Sexual activity: Yes   Other Topics Concern  . Not on file   Social History Narrative  . No narrative on file     Family History:  The patient's family history includes Diabetes type II in her father, mother, and sister.     ROS:   Please see the history of present illness.    ROS All other systems reviewed and are negative.   PHYSICAL EXAM:   VS:  BP (!) 150/92   Pulse 79   Ht 5' (1.524 m)   Wt 124 lb (56.2 kg)   SpO2 96%   BMI 24.22 kg/m    GEN: Well nourished, well developed, in no acute distress  HEENT: normal  Neck: no JVD, carotid bruits, or masses Cardiac: RRR; no murmurs, rubs, or gallops,no edema  Respiratory:  clear to auscultation bilaterally, normal work of breathing GI: soft, nontender, nondistended, + BS MS: no deformity or atrophy  Skin: warm and dry, no rash Neuro:  Alert and Oriented x 3, Strength and sensation are intact Psych: euthymic mood, full affect  Wt Readings from Last 3 Encounters:  10/12/15 124 lb (56.2 kg)  03/17/14 141 lb 12.8 oz (64.3 kg)  02/28/14 135 lb (61.2 kg)      Studies/Labs Reviewed:   EKG:  EKG is ordered today.  The ekg ordered today demonstrates severe LVH with repol abnormalities. TWI now present in III and AVF and V3-V6.    Recent Labs: 01/19/2015: BUN 14; Creatinine, Ser 1.20; Hemoglobin 10.0; Platelets 171; Potassium 5.0; Sodium 143   Lipid Panel    Component Value Date/Time   CHOL 198 02/28/2011 1030   TRIG 167 (H) 02/28/2011 1030  HDL 71 02/28/2011 1030   CHOLHDL 2.8 02/28/2011 1030   VLDL 33 02/28/2011 1030   LDLCALC 94 02/28/2011 1030    Additional studies/ records that were reviewed today include:  03/01/14 Overall Impression:  Low risk stress nuclear study. There is evidence of small inferolateral scar with minimal peri-infarct ischemia. SDS=4, new since 2008. Normal LV systolic function. LV Ejection Fraction: 53%. LV Wall Motion: NL LV Function; NL Wall Motion   ASSESSMENT & PLAN:   Chest pain:  ECG today with new TWI but this could be related to LVH and repol abnormalities.  Discussed with Dr. Tamala Julian, her primary cardiologist. We don't think there is much utility in a repeat nuclear study. She has exertional chest tightness and DOE and many RFs for CAD. We had a long discussion about heart catheterization but she is not sure if she wants to pursue this. Will get pre cath labs just in case she wants to set this up. Also will get a TSH with significant fatigue. Will start imdur 30mg  daily.  Will start with a 2D ECHO  I have reviewed the risks, indications, and alternatives to cardiac catheterization and possible angioplasty/stenting with the patient. Risks include but are not limited to bleeding, infection, vascular injury, stroke, myocardial infection, arrhythmia, kidney injury, radiation-related injury in the case of prolonged fluoroscopy use, emergency cardiac surgery, and death. The patient understands the risks of serious complication is low (<9%). She will call us back if she wants to set up.  Chronic diastolic CHF: appears euvolemic. Continue lasix 20mg  daily. With severe LVH on ECG will get an echocardiogram  HTN: BP uncontrolled (150-92) on Coreg 25mg  BID, hydralazine 40mg  daily, lasix  20mg  daily, and lisinopril 40mg  daily. Will add imdur 30mg  daily in the setting of chest pain.   HLD: continue statin.   CKD: creat baseline ~1.2. Will get labs today   DMT2: continue current regimen   Medication Adjustments/Labs and Tests Ordered: Current medicines are reviewed at length with the patient today.  Concerns regarding medicines are outlined above.  Medication changes, Labs and Tests ordered today are listed in the Patient Instructions below. Patient Instructions  Medication Instructions:  Your physician has recommended you make the following change in your medication:  1-START Imdur 30 mg by mouth daily  Labwork: Your physician recommends that you have lab work today: BMET, CBC, PT/INR, TSH  Testing/Procedures: Your physician has requested that you have an echocardiogram. Echocardiography is a painless test that uses sound waves to create images of your heart. It provides your doctor with information about the size and shape of your heart and how well your heart's chambers and valves are working. This procedure takes approximately one hour. There are no restrictions for this procedure.  Follow-Up: Your physician wants you to follow-up in: 1 month with [name] PA or Dr. Tamala Julian to recheck your blood pressure and to follow-up on echocardiogram results.   If you need a refill on your cardiac medications before your next appointment, please call your pharmacy.   Coronary Angiogram A coronary angiogram, also called coronary angiography, is an X-ray procedure used to look at the arteries in the heart. In this procedure, a dye (contrast dye) is injected through a long, hollow tube (catheter). The catheter is about the size of a piece of cooked spaghetti and is inserted through your groin, wrist, or arm. The dye is injected into each artery, and X-rays are then taken to show if there is a blockage in the arteries  of your heart. LET [facility] CARE PROVIDER KNOW ABOUT:  Any  allergies you have, including allergies to shellfish or contrast dye.   All medicines you are taking, including vitamins, herbs, eye drops, creams, and over-the-counter medicines.   Previous problems you or members of your family have had with the use of anesthetics.   Any blood disorders you have.   Previous surgeries you have had.  History of kidney problems or failure.   Other medical conditions you have. RISKS AND COMPLICATIONS  Generally, a coronary angiogram is a safe procedure. However, problems can occur and include:  Allergic reaction to the dye.  Bleeding from the access site or other locations.  Kidney injury, especially in people with impaired kidney function.  Stroke (rare).  Heart attack (rare). BEFORE THE PROCEDURE   Do not eat or drink anything after midnight the night before the procedure or as directed by your health care provider.   Ask your health care provider about changing or stopping your regular medicines. This is especially important if you are taking diabetes medicines or blood thinners. PROCEDURE  You may be given a medicine to help you relax (sedative) before the procedure. This medicine is given through an intravenous (IV) access tube that is inserted into one of your veins.   The area where the catheter will be inserted will be washed and shaved. This is usually done in the groin but may be done in the fold of your arm (near your elbow) or in the wrist.   A medicine will be given to numb the area where the catheter will be inserted (local anesthetic).   The health care provider will insert the catheter into an artery. The catheter will be guided by using a special type of X-ray (fluoroscopy) of the blood vessel being examined.   A special dye will then be injected into the catheter, and X-rays will be taken. The dye will help to show where any narrowing or blockages are located in the heart arteries.  AFTER THE PROCEDURE   If the  procedure is done through the leg, you will be kept in bed lying flat for several hours. You will be instructed to not bend or cross your legs.  The insertion site will be checked frequently.   The pulse in your feet or wrist will be checked frequently.   Additional blood tests, X-rays, and an electrocardiogram may be done.    This information is not intended to replace advice given to you by your health care provider. Make sure you discuss any questions you have with your health care provider.   Document Released: 07/06/2002 Document Revised: 01/20/2014 Document Reviewed: 05/24/2012 Elsevier Interactive Patient Education 2016 [place], Angelena Form, [place]  10/12/2015 2:15 PM    [facility] [place], [place], [place]  [address] Phone: [phone]; Fax: [phone]

## 2015-10-12 ENCOUNTER — Ambulatory Visit (INDEPENDENT_AMBULATORY_CARE_PROVIDER_SITE_OTHER): Payer: Medicare Other | Admitting: Physician Assistant

## 2015-10-12 ENCOUNTER — Encounter: Payer: Self-pay | Admitting: *Deleted

## 2015-10-12 VITALS — BP 150/92 | HR 79 | Ht 60.0 in | Wt 124.0 lb

## 2015-10-12 DIAGNOSIS — E785 Hyperlipidemia, unspecified: Secondary | ICD-10-CM

## 2015-10-12 DIAGNOSIS — I10 Essential (primary) hypertension: Secondary | ICD-10-CM | POA: Diagnosis not present

## 2015-10-12 DIAGNOSIS — N189 Chronic kidney disease, unspecified: Secondary | ICD-10-CM

## 2015-10-12 DIAGNOSIS — Z01812 Encounter for preprocedural laboratory examination: Secondary | ICD-10-CM

## 2015-10-12 DIAGNOSIS — R0789 Other chest pain: Secondary | ICD-10-CM

## 2015-10-12 DIAGNOSIS — R0602 Shortness of breath: Secondary | ICD-10-CM | POA: Diagnosis not present

## 2015-10-12 DIAGNOSIS — I5032 Chronic diastolic (congestive) heart failure: Secondary | ICD-10-CM

## 2015-10-12 DIAGNOSIS — E118 Type 2 diabetes mellitus with unspecified complications: Secondary | ICD-10-CM

## 2015-10-12 DIAGNOSIS — I517 Cardiomegaly: Secondary | ICD-10-CM | POA: Diagnosis not present

## 2015-10-12 LAB — CBC WITH DIFFERENTIAL/PLATELET
BASOS ABS: 51 {cells}/uL (ref 0–200)
Basophils Relative: 1 %
EOS PCT: 3 %
Eosinophils Absolute: 153 cells/uL (ref 15–500)
HCT: 36.7 % (ref 35.0–45.0)
HEMOGLOBIN: 12.2 g/dL (ref 11.7–15.5)
LYMPHS ABS: 1581 {cells}/uL (ref 850–3900)
Lymphocytes Relative: 31 %
MCH: 30.3 pg (ref 27.0–33.0)
MCHC: 33.2 g/dL (ref 32.0–36.0)
MCV: 91.3 fL (ref 80.0–100.0)
MPV: 10.6 fL (ref 7.5–12.5)
Monocytes Absolute: 561 cells/uL (ref 200–950)
Monocytes Relative: 11 %
NEUTROS ABS: 2754 {cells}/uL (ref 1500–7800)
Neutrophils Relative %: 54 %
Platelets: 208 10*3/uL (ref 140–400)
RBC: 4.02 MIL/uL (ref 3.80–5.10)
RDW: 15.1 % — ABNORMAL HIGH (ref 11.0–15.0)
WBC: 5.1 10*3/uL (ref 3.8–10.8)

## 2015-10-12 LAB — BASIC METABOLIC PANEL
BUN: 16 mg/dL (ref 7–25)
CALCIUM: 9 mg/dL (ref 8.6–10.4)
CO2: 21 mmol/L (ref 20–31)
Chloride: 104 mmol/L (ref 98–110)
Creat: 1.13 mg/dL — ABNORMAL HIGH (ref 0.60–0.88)
Glucose, Bld: 97 mg/dL (ref 65–99)
Potassium: 4.3 mmol/L (ref 3.5–5.3)
SODIUM: 141 mmol/L (ref 135–146)

## 2015-10-12 LAB — TSH: TSH: 1.58 m[IU]/L

## 2015-10-12 MED ORDER — ISOSORBIDE MONONITRATE ER 30 MG PO TB24: 30.0000 mg | ORAL_TABLET | Freq: Every day | ORAL | 3 refills | Status: DC

## 2015-10-12 NOTE — Patient Instructions (Addendum)
Medication Instructions:  Your physician has recommended you make the following change in your medication:  1-START Imdur 30 mg by mouth daily  Labwork: Your physician recommends that you have lab work today: BMET, CBC, PT/INR, TSH  Testing/Procedures: Your physician has requested that you have an echocardiogram. Echocardiography is a painless test that uses sound waves to create images of your heart. It provides your doctor with information about the size and shape of your heart and how well your heart's chambers and valves are working. This procedure takes approximately one hour. There are no restrictions for this procedure.  Follow-Up: Your physician wants you to follow-up in: 1 month with [name] PA or Dr. Tamala Julian to recheck your blood pressure and to follow-up on echocardiogram results.   If you need a refill on your cardiac medications before your next appointment, please call your pharmacy.   Coronary Angiogram A coronary angiogram, also called coronary angiography, is an X-ray procedure used to look at the arteries in the heart. In this procedure, a dye (contrast dye) is injected through a long, hollow tube (catheter). The catheter is about the size of a piece of cooked spaghetti and is inserted through your groin, wrist, or arm. The dye is injected into each artery, and X-rays are then taken to show if there is a blockage in the arteries of your heart. LET [facility] CARE PROVIDER KNOW ABOUT:  Any allergies you have, including allergies to shellfish or contrast dye.   All medicines you are taking, including vitamins, herbs, eye drops, creams, and over-the-counter medicines.   Previous problems you or members of your family have had with the use of anesthetics.   Any blood disorders you have.   Previous surgeries you have had.  History of kidney problems or failure.   Other medical conditions you have. RISKS AND COMPLICATIONS  Generally, a coronary angiogram is a  safe procedure. However, problems can occur and include:  Allergic reaction to the dye.  Bleeding from the access site or other locations.  Kidney injury, especially in people with impaired kidney function.  Stroke (rare).  Heart attack (rare). BEFORE THE PROCEDURE   Do not eat or drink anything after midnight the night before the procedure or as directed by your health care provider.   Ask your health care provider about changing or stopping your regular medicines. This is especially important if you are taking diabetes medicines or blood thinners. PROCEDURE  You may be given a medicine to help you relax (sedative) before the procedure. This medicine is given through an intravenous (IV) access tube that is inserted into one of your veins.   The area where the catheter will be inserted will be washed and shaved. This is usually done in the groin but may be done in the fold of your arm (near your elbow) or in the wrist.   A medicine will be given to numb the area where the catheter will be inserted (local anesthetic).   The health care provider will insert the catheter into an artery. The catheter will be guided by using a special type of X-ray (fluoroscopy) of the blood vessel being examined.   A special dye will then be injected into the catheter, and X-rays will be taken. The dye will help to show where any narrowing or blockages are located in the heart arteries.  AFTER THE PROCEDURE   If the procedure is done through the leg, you will be kept in bed lying flat for several  hours. You will be instructed to not bend or cross your legs.  The insertion site will be checked frequently.   The pulse in your feet or wrist will be checked frequently.   Additional blood tests, X-rays, and an electrocardiogram may be done.    This information is not intended to replace advice given to you by your health care provider. Make sure you discuss any questions you have with your  health care provider.   Document Released: 07/06/2002 Document Revised: 01/20/2014 Document Reviewed: 05/24/2012 Elsevier Interactive Patient Education [employer].

## 2015-10-13 LAB — PROTIME-INR
INR: 1.1
PROTHROMBIN TIME: 11.7 s — AB (ref 9.0–11.5)

## 2015-10-15 ENCOUNTER — Telehealth: Payer: Self-pay | Admitting: Physician Assistant

## 2015-10-15 ENCOUNTER — Encounter: Payer: Self-pay | Admitting: *Deleted

## 2015-10-15 NOTE — Telephone Encounter (Signed)
Follow Up:    Returning call,she does not know who called.

## 2015-10-15 NOTE — Telephone Encounter (Signed)
Notified the pt that per [name] PA-C, her thyroid function was normal, and pre-cath labs look good. Per the pt, she is still deciding if she wants to proceed with scheduling her cath or not.  Pt will notify Katie's CMA if she decides to schedule this.  Will route this message to [place], as a general FYI.

## 2015-10-16 ENCOUNTER — Encounter: Payer: Self-pay | Admitting: Physician Assistant

## 2015-10-16 NOTE — Addendum Note (Signed)
Addended by: Marlis Edelson C on: 10/16/2015 08:10 AM   Modules accepted: Orders

## 2015-10-25 ENCOUNTER — Other Ambulatory Visit: Payer: Self-pay

## 2015-10-25 ENCOUNTER — Ambulatory Visit (HOSPITAL_COMMUNITY): Payer: Medicare Other | Attending: Cardiology

## 2015-10-25 DIAGNOSIS — N189 Chronic kidney disease, unspecified: Secondary | ICD-10-CM | POA: Insufficient documentation

## 2015-10-25 DIAGNOSIS — I351 Nonrheumatic aortic (valve) insufficiency: Secondary | ICD-10-CM | POA: Insufficient documentation

## 2015-10-25 DIAGNOSIS — I071 Rheumatic tricuspid insufficiency: Secondary | ICD-10-CM | POA: Insufficient documentation

## 2015-10-25 DIAGNOSIS — I371 Nonrheumatic pulmonary valve insufficiency: Secondary | ICD-10-CM | POA: Insufficient documentation

## 2015-10-25 DIAGNOSIS — I340 Nonrheumatic mitral (valve) insufficiency: Secondary | ICD-10-CM | POA: Insufficient documentation

## 2015-10-25 DIAGNOSIS — R0789 Other chest pain: Secondary | ICD-10-CM | POA: Diagnosis not present

## 2015-10-25 DIAGNOSIS — E1122 Type 2 diabetes mellitus with diabetic chronic kidney disease: Secondary | ICD-10-CM | POA: Insufficient documentation

## 2015-10-25 DIAGNOSIS — E785 Hyperlipidemia, unspecified: Secondary | ICD-10-CM | POA: Insufficient documentation

## 2015-10-25 DIAGNOSIS — I1310 Hypertensive heart and chronic kidney disease without heart failure, with stage 1 through stage 4 chronic kidney disease, or unspecified chronic kidney disease: Secondary | ICD-10-CM | POA: Insufficient documentation

## 2015-10-25 DIAGNOSIS — R0602 Shortness of breath: Secondary | ICD-10-CM | POA: Insufficient documentation

## 2015-10-25 DIAGNOSIS — R079 Chest pain, unspecified: Secondary | ICD-10-CM | POA: Diagnosis present

## 2015-11-09 NOTE — Progress Notes (Signed)
Cardiology Office Note    Date:  11/14/2015   ID:  [name], DOB 1932/04/04, MRN [id number]  PCP:  Elizabeth Palau, MD (Inactive)  Cardiologist:  Dr. Tamala Julian   CC: follow up  History of Present Illness:  [name] is a 82 y.o. female with a history of history of gum cancer s/p radiation in [place] (1990s) HTN, HLD, DMT2, CKD and chronic diastolic CHF who presents to clinic for evaluation of chest pain.  She was admitted to [facility] in 02/2014 for chest pain and ruled out for MI. An outpatient myocardial perfusion study was low risk. LV function was noted to be normal.   She was admitted in 01/2015 for fevers, PNA and hematoma of left hip after a fall.   She was last seen by Dr. Tamala Julian in 03/2014 for follow up and was doing well at that time.   I saw her in clinic on 10/12/15 for exertional chest pain and fatigue. She had high voltage on her ECG with TWIs. I started her on imdur 30mg  and set up up for 2D ECHO and possible cath. She wasn't sure if she was interested in a heart cath or not. TSH was normal. Echocardiogram showed normal EF with no wall motion abnormalities, G1DD, moderate focal basal hypertrophy of the septum, mild AR, mod-severe LAE, mild TR, mod PR, PA pressure 40. She never ended up setting up heart cath  Today she presents to clinic for follow up. She still chest pain with anxiety that is mostly at night. Now not related to exertion. It is a tightness that does not radiate. It lis intermittent and lasts minute. No SOB. No LE edema, orthopnea or PND. She does have dizziness but no syncope. She stays tired and sometimes feels weak. No blood in stool or urine. Does not check BP at home but says that her BP always is high at MD offices.     Past Medical History:  Diagnosis Date  . Anemia 04/03/2011  . Anginal pain ([place])   . Blind left eye 1999  . Depression   . Exertional dyspnea   . GERD (gastroesophageal reflux disease)   . High cholesterol   . Hip fracture ([place])  07/17/11   fall from 1-2 feet; left  . Hypertension   . Prosthetic eye globe 1999   "from diabetic; left eye"  . Tongue cancer ([place]) 1998   S/P radiation  . Type II diabetes mellitus ([place])    not taking any medication    Past Surgical History:  Procedure Laterality Date  . HIP ARTHROPLASTY  07/18/2011   Procedure: ARTHROPLASTY BIPOLAR HIP;  Surgeon: Nita Sells, MD;  Location: [facility];  Service: Orthopedics;  Laterality: Left;  . INTRAOCULAR PROSTHESES INSERTION  1999   left  . TONGUE BIOPSY  1998   "cancer"  . TUBAL LIGATION  1970's    Current Medications: Outpatient Medications Prior to Visit  Medication Sig Dispense Refill  . ALPHAGAN P 0.1 % SOLN Place 1 drop into the right eye 2 (two) times daily.    Marland Kitchen aspirin 81 MG tablet Take 81 mg by mouth daily.    . carvedilol (COREG) 25 MG tablet Take 25 mg by mouth 2 (two) times daily.    . furosemide (LASIX) 20 MG tablet take 1 tablet by mouth once daily 30 tablet 5  . hydrALAZINE (APRESOLINE) 100 MG tablet Take 100 mg by mouth 2 (two) times daily.    . isosorbide mononitrate (IMDUR) 30 MG 24  hr tablet Take 1 tablet (30 mg total) by mouth daily. 90 tablet 3  . lisinopril (PRINIVIL,ZESTRIL) 40 MG tablet Take 40 mg by mouth daily.    Marland Kitchen omeprazole (PRILOSEC) 20 MG capsule Take 1 capsule (20 mg total) by mouth daily. 30 capsule 10  . simvastatin (ZOCOR) 40 MG tablet Take 1 tablet (40 mg total) by mouth every morning. 30 tablet 0  . TRAVATAN Z 0.004 % SOLN ophthalmic solution Place 1 drop into the right eye at bedtime.      No facility-administered medications prior to visit.      Allergies:   Review of patient's allergies indicates no known allergies.   Social History   Social History  . Marital status: Single    Spouse name: N/A  . Number of children: N/A  . Years of education: N/A   Social History Main Topics  . Smoking status: Former Smoker    Packs/day: 1.50    Years: 47.00    Types: Cigarettes    Quit date:  09/13/1997  . Smokeless tobacco: Never Used  . Alcohol use 10.2 oz/week    17 Shots of liquor per week     Comment: 07/17/11 "1/5 rum q weekend"  . Drug use: No     Comment: 07/17/11 "last drug use ~ 20 yr ago"  . Sexual activity: Yes   Other Topics Concern  . None   Social History Narrative  . None     Family History:  The patient's family history includes Diabetes type II in her father, mother, and sister.     ROS:   Please see the history of present illness.    ROS All other systems reviewed and are negative.   PHYSICAL EXAM:   VS:  BP (!) 180/86   Pulse 65   Ht 5' (1.524 m)   Wt 126 lb 12.8 oz (57.5 kg)   BMI 24.76 kg/m    GEN: Well nourished, well developed, in no acute distress  HEENT: normal  Neck: no JVD, carotid bruits, or masses Cardiac: RRR; no murmurs, rubs, or gallops,no edema  Respiratory:  clear to auscultation bilaterally, normal work of breathing GI: soft, nontender, nondistended, + BS MS: no deformity or atrophy  Skin: warm and dry, no rash Neuro:  Alert and Oriented x 3, Strength and sensation are intact Psych: euthymic mood, full affect  Wt Readings from Last 3 Encounters:  11/14/15 126 lb 12.8 oz (57.5 kg)  10/12/15 124 lb (56.2 kg)  03/17/14 141 lb 12.8 oz (64.3 kg)      Studies/Labs Reviewed:   EKG:  EKG is NOT ordered today.  Recent Labs: 10/12/2015: BUN 16; Creat 1.13; Hemoglobin 12.2; Platelets 208; Potassium 4.3; Sodium 141; TSH 1.58   Lipid Panel    Component Value Date/Time   CHOL 198 02/28/2011 1030   TRIG 167 (H) 02/28/2011 1030   HDL 71 02/28/2011 1030   CHOLHDL 2.8 02/28/2011 1030   VLDL 33 02/28/2011 1030   LDLCALC 94 02/28/2011 1030    Additional studies/ records that were reviewed today include:  03/01/14 Overall Impression:  Low risk stress nuclear study. There is evidence of small inferolateral scar with minimal peri-infarct ischemia. SDS=4, new since 2008. Normal LV systolic function. LV Ejection Fraction: 53%. LV  Wall Motion: NL LV Function; NL Wall Motion   ASSESSMENT & PLAN:   Chest pain:  Feels the same on the imdur. She thinks its related to anxiety and not related to exertion like she had  told me last visit. She does not want to do a heart catheterization "at her age."  Chronic diastolic CHF: appears euvolemic. Continue lasix 20mg  daily.   HTN: BP uncontrolled on on Coreg 25mg  BID, hydralazine 100 mg BID, lasix 20mg  daily, and lisinopril 40mg  daily and imdur 30mg  daily. I will have to add amlodipine 5mg  daily and have her seen in the HTN clinic next week.   HLD: continue statin.   CKD: creat baseline ~1.1  DMT2: continue current regimen   Medication Adjustments/Labs and Tests Ordered: Current medicines are reviewed at length with the patient today.  Concerns regarding medicines are outlined above.  Medication changes, Labs and Tests ordered today are listed in the Patient Instructions below. Patient Instructions  Medication Instructions:  Your physician has recommended you make the following change in your medication:  1.  START Amlodipine 5 mg taking 1 tablet daily    Labwork: None ordered  Testing/Procedures: None ordered  Follow-Up: Your physician recommends that you schedule a follow-up appointment in: [facility] PHARM-D FOR BP RECHECK  Your physician recommends that you schedule a follow-up appointment in: [place]    Any Other Special Instructions Will Be Listed Below (If Applicable).     If you need a refill on your cardiac medications before your next appointment, please call your pharmacy.      Signed, Angelena Form, PA-C  11/14/2015 11:44 AM    [facility] [place], [place], [place]  [address] Phone: [phone]; Fax: [phone]

## 2015-11-14 ENCOUNTER — Telehealth: Payer: Self-pay | Admitting: *Deleted

## 2015-11-14 ENCOUNTER — Ambulatory Visit (INDEPENDENT_AMBULATORY_CARE_PROVIDER_SITE_OTHER): Payer: Medicare Other | Admitting: Physician Assistant

## 2015-11-14 ENCOUNTER — Encounter: Payer: Self-pay | Admitting: Physician Assistant

## 2015-11-14 VITALS — BP 180/86 | HR 65 | Ht 60.0 in | Wt 126.8 lb

## 2015-11-14 DIAGNOSIS — I5032 Chronic diastolic (congestive) heart failure: Secondary | ICD-10-CM

## 2015-11-14 DIAGNOSIS — E118 Type 2 diabetes mellitus with unspecified complications: Secondary | ICD-10-CM

## 2015-11-14 DIAGNOSIS — E785 Hyperlipidemia, unspecified: Secondary | ICD-10-CM

## 2015-11-14 DIAGNOSIS — I10 Essential (primary) hypertension: Secondary | ICD-10-CM

## 2015-11-14 MED ORDER — AMLODIPINE BESYLATE 5 MG PO TABS: 5.0000 mg | ORAL_TABLET | Freq: Every day | ORAL | 1 refills | Status: DC

## 2015-11-14 NOTE — Telephone Encounter (Signed)
error 

## 2015-11-14 NOTE — Patient Instructions (Addendum)
Medication Instructions:  Your physician has recommended you make the following change in your medication:  1.  START Amlodipine 5 mg taking 1 tablet daily    Labwork: None ordered  Testing/Procedures: None ordered  Follow-Up: Your physician recommends that you schedule a follow-up appointment in: [place] PHARM-D FOR BP RECHECK  Your physician recommends that you schedule a follow-up appointment in: [place]    Any Other Special Instructions Will Be Listed Below (If Applicable).     If you need a refill on your cardiac medications before your next appointment, please call your pharmacy.

## 2015-11-20 ENCOUNTER — Telehealth: Payer: Self-pay | Admitting: *Deleted

## 2015-11-20 ENCOUNTER — Telehealth: Payer: Self-pay | Admitting: Physician Assistant

## 2015-11-20 NOTE — Telephone Encounter (Signed)
    I had placed patient on amlodipine for uncontrolled HTN. Patient is also on simvastatin 40mg . Due to the potential interaction, we will switch her to atorvastatin 20mg  daily.    Angelena Form PA-C  [facility]

## 2015-11-20 NOTE — Telephone Encounter (Signed)
Called pt, per [name], PA-C, due to possible interaction with Amlodipine and Simvastatin,  We will switch the pt to Atorvastatin 20 mg .  Left a message for her to call back.

## 2015-11-22 ENCOUNTER — Ambulatory Visit: Payer: Medicare Other | Admitting: Pharmacist

## 2015-11-22 NOTE — Progress Notes (Deleted)
Patient ID: [name]                 DOB: [birth year]                      MRN: [id number]     HPI: [name] is a 82 y.o. female is a patient of Dr. Tamala Julian referred to HTN clinic by Angelena Form, PA for HTN medication management. PMH of HTN, HLD, DMT2, CKD and chronic diastolic CHF. At last clinic visit on 11/1, the Pt's BP was 180/86, HR 65. She had been on carvedilol 25 mg BID, furosemide 20 mg daily, hydralazine 100 mg BID, isosorbide mononitrate 30 mg daily, lisinopril 40 mg. Amlodipine 5 mg daily was added at that visit. She does not have a BP cuff at home.  Called Adherence issue? Alcohol intake, drug use? Take BP at home Explain why taken off simva, make sure she picked up atorva  Current HTN meds: carvedilol 25 mg BID, furosemide 20 mg daily, hydralazine 100 mg BID, isosorbide mononitrate 30 mg daily, lisinopril 40 mg Previously tried:  BP goal: <150/90 mmHg  Family History: DM (father, mother, sister)  Social History: Former smoker of cigarettes, 1.5 PPD for 47 years. Quit on 09/13/1997.   Diet:   Exercise:   Home BP readings:   Wt Readings from Last 3 Encounters:  11/14/15 126 lb 12.8 oz (57.5 kg)  10/12/15 124 lb (56.2 kg)  03/17/14 141 lb 12.8 oz (64.3 kg)   BP Readings from Last 3 Encounters:  11/14/15 (!) 180/86  10/12/15 (!) 150/92  01/19/15 (!) 153/56   Pulse Readings from Last 3 Encounters:  11/14/15 65  10/12/15 79  01/19/15 73    Renal function: CrCl cannot be calculated (Patient's most recent lab result is older than the maximum 21 days allowed.).  Past Medical History:  Diagnosis Date  . Anemia 04/03/2011  . Anginal pain ([place])   . Blind left eye 1999  . Depression   . Exertional dyspnea   . GERD (gastroesophageal reflux disease)   . High cholesterol   . Hip fracture ([place]) 07/17/11   fall from 1-2 feet; left  . Hypertension   . Prosthetic eye globe 1999   "from diabetic; left eye"  . Tongue cancer ([place]) 1998   S/P radiation    . Type II diabetes mellitus ([place])    not taking any medication    Current Outpatient Prescriptions on File Prior to Visit  Medication Sig Dispense Refill  . ALPHAGAN P 0.1 % SOLN Place 1 drop into the right eye 2 (two) times daily.    Marland Kitchen amLODipine (NORVASC) 5 MG tablet Take 1 tablet (5 mg total) by mouth daily. 90 tablet 1  . aspirin 81 MG tablet Take 81 mg by mouth daily.    . carvedilol (COREG) 25 MG tablet Take 25 mg by mouth 2 (two) times daily.    . furosemide (LASIX) 20 MG tablet take 1 tablet by mouth once daily 30 tablet 5  . hydrALAZINE (APRESOLINE) 100 MG tablet Take 100 mg by mouth 2 (two) times daily.    . isosorbide mononitrate (IMDUR) 30 MG 24 hr tablet Take 1 tablet (30 mg total) by mouth daily. 90 tablet 3  . lisinopril (PRINIVIL,ZESTRIL) 40 MG tablet Take 40 mg by mouth daily.    Marland Kitchen omeprazole (PRILOSEC) 20 MG capsule Take 1 capsule (20 mg total) by mouth daily. 30 capsule 10  . simvastatin (ZOCOR) 40 MG tablet  Take 1 tablet (40 mg total) by mouth every morning. 30 tablet 0  . TRAVATAN Z 0.004 % SOLN ophthalmic solution Place 1 drop into the right eye at bedtime.      No current facility-administered medications on file prior to visit.     No Known Allergies   Assessment/Plan:  1. Hypertension -

## 2015-11-27 NOTE — Progress Notes (Signed)
Patient ID: [name]                 DOB: [birth year]                      MRN: [id number]     HPI: [name] is a 82 y.o. female of Dr. Tamala Julian referred to the HTN clinic by Angelena Form, PA. PMH of HTN, HLD, DMT2, CKD and chronic diastolic CHF. At last OV on 11/01, the pt's blood pressure was uncontrolled at 180/86 on lisinopril 40mg  daily, Imdur 30mg  daily, hydralazine 100mg  BID, furosemide 20mg  daily, and carvedilol 25mg  BID. She was started on amlodipine 5 mg daily. She has an extensive drinking history and had reported drinking 17 shots of liquor per week in addition to beer.   Today, the patient reports tolerating her medications well and denies swelling. She endorses some orthostatic hypotension, but denies CP, faintness, SOB and blurred vision. She does not measure her blood pressure at home, but would like a prescription for a blood pressure cuff.  Her clinic blood pressure reading today of 154/70 mmHg was without taking her medications yet and after two cups of coffee. She reports no other adverse effects with medication and states carvedilol has been helping with her anxiety.  Current HTN meds: lisinopril 40mg  daily, Imdur 30mg  daily, hydralazine 100mg  BID, furosemide 20mg  daily, carvedilol 25mg  BID, amlodipine 5mg  daily Previously tried: N/A BP goal: <140/90 mmHg  Family History: DM (mother, father, sister)  Social History: Formerly smoked cigarettes. Smoked 1.5 packs/day for 47 years. Quit on 09/13/1997. She reports drinking 3 budweiser beers on the weekend with about 8 shots of rum each weekend day. She denies illicit drug use.   Diet: Drinks 2 cups of caffeinated coffee every morning and nibbles food throughout the day. She uses mostly pepper to season her food and avoids using extra salt.   Exercise: None  Wt Readings from Last 3 Encounters:  11/14/15 126 lb 12.8 oz (57.5 kg)  10/12/15 124 lb (56.2 kg)  03/17/14 141 lb 12.8 oz (64.3 kg)   BP Readings from Last  3 Encounters:  11/14/15 (!) 180/86  10/12/15 (!) 150/92  01/19/15 (!) 153/56   Pulse Readings from Last 3 Encounters:  11/14/15 65  10/12/15 79  01/19/15 73    Renal function: CrCl cannot be calculated (Patient's most recent lab result is older than the maximum 21 days allowed.).  Past Medical History:  Diagnosis Date  . Anemia 04/03/2011  . Anginal pain ([place])   . Blind left eye 1999  . Depression   . Exertional dyspnea   . GERD (gastroesophageal reflux disease)   . High cholesterol   . Hip fracture ([place]) 07/17/11   fall from 1-2 feet; left  . Hypertension   . Prosthetic eye globe 1999   "from diabetic; left eye"  . Tongue cancer ([place]) 1998   S/P radiation  . Type II diabetes mellitus ([place])    not taking any medication    Current Outpatient Prescriptions on File Prior to Visit  Medication Sig Dispense Refill  . ALPHAGAN P 0.1 % SOLN Place 1 drop into the right eye 2 (two) times daily.    Marland Kitchen amLODipine (NORVASC) 5 MG tablet Take 1 tablet (5 mg total) by mouth daily. 90 tablet 1  . aspirin 81 MG tablet Take 81 mg by mouth daily.    . carvedilol (COREG) 25 MG tablet Take 25 mg by mouth 2 (two) times daily.    Marland Kitchen  furosemide (LASIX) 20 MG tablet take 1 tablet by mouth once daily 30 tablet 5  . hydrALAZINE (APRESOLINE) 100 MG tablet Take 100 mg by mouth 2 (two) times daily.    . isosorbide mononitrate (IMDUR) 30 MG 24 hr tablet Take 1 tablet (30 mg total) by mouth daily. 90 tablet 3  . lisinopril (PRINIVIL,ZESTRIL) 40 MG tablet Take 40 mg by mouth daily.    Marland Kitchen omeprazole (PRILOSEC) 20 MG capsule Take 1 capsule (20 mg total) by mouth daily. 30 capsule 10  . simvastatin (ZOCOR) 40 MG tablet Take 1 tablet (40 mg total) by mouth every morning. 30 tablet 0  . TRAVATAN Z 0.004 % SOLN ophthalmic solution Place 1 drop into the right eye at bedtime.      No current facility-administered medications on file prior to visit.     No Known Allergies   Assessment/Plan:  1. Hypertension -   Patient's blood pressure reading of 154/70 mmHg is above the recommended goal of <140/90 mmHg. Will increase her amlodipine dose to 10 mg daily. Strongly recommended that she start decreasing her alcohol intake not only to lower blood pressure, but also to mitigate other cardiovascular and organ complications. Also discussed limiting her caffeine intake by switching to decaffeinated coffee. Provided her a prescription for a blood pressure cuff, but not sure if her insurance will cover this. Follow-up in clinic in 1 month for a blood pressure check.   2. Hyperlipidemia - Discussed with patient recent change from simvastatin to atorvastatin due to interaction with amlodipine. Patient was unaware of this change. Sent prescription for atorvastatin 20 mg daily and told patient to discontinue taking simvastatin. Patient verbalized understanding.  Patient seen by Leroy Libman, P4 pharmacy student.   Toccara Alford E. Janeal Abadi, PharmD, [place] [address] [place], [place] [address] Phone: [phone]; Fax: [phone] 11/28/2015 11:43 AM

## 2015-11-28 ENCOUNTER — Ambulatory Visit (INDEPENDENT_AMBULATORY_CARE_PROVIDER_SITE_OTHER): Payer: Medicare Other | Admitting: Pharmacist

## 2015-11-28 VITALS — BP 154/70 | HR 89

## 2015-11-28 DIAGNOSIS — I10 Essential (primary) hypertension: Secondary | ICD-10-CM

## 2015-11-28 MED ORDER — AMLODIPINE BESYLATE 10 MG PO TABS: 10.0000 mg | ORAL_TABLET | Freq: Every day | ORAL | 3 refills | Status: DC

## 2015-11-28 MED ORDER — ATORVASTATIN CALCIUM 20 MG PO TABS: 20.0000 mg | ORAL_TABLET | Freq: Every day | ORAL | 3 refills | Status: DC

## 2015-11-28 NOTE — Patient Instructions (Addendum)
Start taking amlodipine 10 mg daily to help lower your blood pressure. If you have remaining tablets of amlodipine 5 mg, just take 2 tablets daily until they are gone.   Continue taking all your other medications for blood pressure as directed.   Limit your alcohol intake and caffeine intake to help lower your blood pressure.   Follow-up in clinic in one month.

## 2015-12-26 ENCOUNTER — Ambulatory Visit (INDEPENDENT_AMBULATORY_CARE_PROVIDER_SITE_OTHER): Payer: Medicare Other | Admitting: Pharmacist

## 2015-12-26 VITALS — BP 116/82 | HR 64

## 2015-12-26 DIAGNOSIS — I10 Essential (primary) hypertension: Secondary | ICD-10-CM | POA: Diagnosis not present

## 2015-12-26 NOTE — Patient Instructions (Signed)
Your blood pressure was excellent today.  Continue your current medications.  Start checking your blood pressure at home and call Megan in blood pressure clinic if the top number is consistently above 150. Phone # [phone].  Otherwise, plan to follow up with Dr Tamala Julian as scheduled.

## 2015-12-26 NOTE — Progress Notes (Signed)
Patient ID: [name]                 DOB: [birth year]                      MRN: [id number]     HPI: [name] is a 82 y.o. female of Dr. Tamala Julian referred to the HTN clinic by Angelena Form, PA who presents today for follow up. PMH of HTN, HLD, DMT2, CKD and chronic diastolic CHF. At last visit 1 month ago, amlodipine dose was increased to 10mg  daily. Patient presents today for follow up.  Patient did purchase a BP cuff through her insurance and it should be arriving some time this week. She has tolerated higher dose of amlodipine well. Denies swelling, headache, or dizziness. Has not taken any of her medications this AM. Had a cup of caffeinated coffee 1-2 hours ago. Carvedilol also helps with pt's anxiety.  Of note, pt has an extensive drinking history and reports drinking 17 shots of liquor per week in addition to beer.  Current HTN meds: lisinopril 40mg  daily, Imdur 30mg  daily, hydralazine 100mg  BID, furosemide 20mg  daily, carvedilol 25mg  BID, amlodipine 10mg  daily Previously tried: N/A BP goal: <140/90 mmHg  Family History: DM (mother, father, sister)  Social History: Formerly smoked cigarettes. Smoked 1.5 packs/day for 47 years. Quit on 09/13/1997. She reports drinking 3 budweiser beers on the weekend with about 8 shots of rum each weekend day. She denies illicit drug use.   Diet: Drinks 2 cups of caffeinated coffee every morning and nibbles food throughout the day. She uses mostly pepper to season her food and avoids using extra salt.   Exercise: None  Wt Readings from Last 3 Encounters:  11/14/15 126 lb 12.8 oz (57.5 kg)  10/12/15 124 lb (56.2 kg)  03/17/14 141 lb 12.8 oz (64.3 kg)   BP Readings from Last 3 Encounters:  11/28/15 (!) 154/70  11/14/15 (!) 180/86  10/12/15 (!) 150/92   Pulse Readings from Last 3 Encounters:  11/28/15 89  11/14/15 65  10/12/15 79    Renal function: CrCl cannot be calculated (Patient's most recent lab result is older than  the maximum 21 days allowed.).  Past Medical History:  Diagnosis Date  . Anemia 04/03/2011  . Anginal pain ([place])   . Blind left eye 1999  . Depression   . Exertional dyspnea   . GERD (gastroesophageal reflux disease)   . High cholesterol   . Hip fracture ([place]) 07/17/11   fall from 1-2 feet; left  . Hypertension   . Prosthetic eye globe 1999   "from diabetic; left eye"  . Tongue cancer ([place]) 1998   S/P radiation  . Type II diabetes mellitus ([place])    not taking any medication    Current Outpatient Prescriptions on File Prior to Visit  Medication Sig Dispense Refill  . ALPHAGAN P 0.1 % SOLN Place 1 drop into the right eye 2 (two) times daily.    Marland Kitchen amLODipine (NORVASC) 10 MG tablet Take 1 tablet (10 mg total) by mouth daily. 90 tablet 3  . aspirin 81 MG tablet Take 81 mg by mouth daily.    Marland Kitchen atorvastatin (LIPITOR) 20 MG tablet Take 1 tablet (20 mg total) by mouth daily. Changing from simva 90 tablet 3  . carvedilol (COREG) 25 MG tablet Take 25 mg by mouth 2 (two) times daily.    . furosemide (LASIX) 20 MG tablet take 1 tablet by mouth once daily 30  tablet 5  . hydrALAZINE (APRESOLINE) 100 MG tablet Take 100 mg by mouth 2 (two) times daily.    . isosorbide mononitrate (IMDUR) 30 MG 24 hr tablet Take 1 tablet (30 mg total) by mouth daily. 90 tablet 3  . lisinopril (PRINIVIL,ZESTRIL) 40 MG tablet Take 40 mg by mouth daily.    Marland Kitchen omeprazole (PRILOSEC) 20 MG capsule Take 1 capsule (20 mg total) by mouth daily. 30 capsule 10  . TRAVATAN Z 0.004 % SOLN ophthalmic solution Place 1 drop into the right eye at bedtime.      No current facility-administered medications on file prior to visit.     No Known Allergies   Assessment/Plan:  1. Hypertension - BP much improved to 118/82 at today's visit, well below goal <140/50mmHg. Will continue current HTN medications. Advised pt to start monitoring her BP at home and to call clinic if systolic readings are consistently elevated above 150.  Otherwise, she will keep follow up appt with Dr Tamala Julian in February.   Megan E. Supple, PharmD, CPP, [place] [address], [place], [place] [address] Phone: [phone]; Fax: [phone] 12/26/2015 9:33 AM

## 2016-02-05 ENCOUNTER — Ambulatory Visit (HOSPITAL_COMMUNITY)
Admission: EM | Admit: 2016-02-05 | Discharge: 2016-02-05 | Disposition: A | Discharge status: Home / Self Care | Payer: Medicare Other | Attending: Family Medicine | Admitting: Family Medicine

## 2016-02-05 ENCOUNTER — Encounter (HOSPITAL_COMMUNITY): Payer: Self-pay | Admitting: Emergency Medicine

## 2016-02-05 DIAGNOSIS — R42 Dizziness and giddiness: Secondary | ICD-10-CM | POA: Diagnosis not present

## 2016-02-05 DIAGNOSIS — R531 Weakness: Secondary | ICD-10-CM | POA: Diagnosis not present

## 2016-02-05 DIAGNOSIS — F419 Anxiety disorder, unspecified: Secondary | ICD-10-CM | POA: Diagnosis not present

## 2016-02-05 LAB — POCT I-STAT, CHEM 8
BUN: 9 mg/dL (ref 6–20)
CALCIUM ION: 1.09 mmol/L — AB (ref 1.15–1.40)
CREATININE: 1.2 mg/dL — AB (ref 0.44–1.00)
Chloride: 106 mmol/L (ref 101–111)
GLUCOSE: 99 mg/dL (ref 65–99)
HCT: 34 % — ABNORMAL LOW (ref 36.0–46.0)
Hemoglobin: 11.6 g/dL — ABNORMAL LOW (ref 12.0–15.0)
POTASSIUM: 3.7 mmol/L (ref 3.5–5.1)
Sodium: 139 mmol/L (ref 135–145)
TCO2: 28 mmol/L (ref 0–100)

## 2016-02-05 NOTE — Discharge Instructions (Signed)
It sounds as though some of the dizziness and weakness your having is due to your anxiety. Your vital signs have been normal. Your blood test is normal although it shows your chronic anemia. Continue taking your medication and if anything new occurs, new problems he may need to go to the emergency department. Otherwise, follow-up with your primary care doctor as scheduled next month.

## 2016-02-05 NOTE — ED Provider Notes (Signed)
CSN: [id number]     Arrival date & time 02/05/16  1213 History   First MD Initiated Contact with Patient 02/05/16 1351     Chief Complaint  Patient presents with  . Hypertension   (Consider location/radiation/quality/duration/timing/severity/associated sxs/prior Treatment) 83 year old female is complaining of episodic dizzy spells and weakness for 2 months. She states the symptoms are exacerbated by anxiety. 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 There are no new symptoms today. The left ear discomfort is chronic for 19 years. Nothing new today. She is completely awake, alert and oriented. Answers questions appropriately. Showing no signs of distress.      Past Medical History:  Diagnosis Date  . Anemia 04/03/2011  . Anginal pain ([place])   . Blind left eye 1999  . Depression   . Exertional dyspnea   . GERD (gastroesophageal reflux disease)   . High cholesterol   . Hip fracture ([place]) 07/17/11   fall from 1-2 feet; left  . Hypertension   . Prosthetic eye globe 1999   "from diabetic; left eye"  . Tongue cancer ([place]) 1998   S/P radiation  . Type II diabetes mellitus ([place])    not taking any medication   Past Surgical History:  Procedure Laterality Date  . HIP ARTHROPLASTY  07/18/2011   Procedure: ARTHROPLASTY BIPOLAR HIP;  Surgeon: Nita Sells, MD;  Location: [facility];  Service: Orthopedics;  Laterality: Left;  . INTRAOCULAR PROSTHESES INSERTION  1999   left  . TONGUE BIOPSY  1998   "cancer"  . TUBAL LIGATION  1970's   Family History  Problem Relation Age of Onset  . Diabetes type II Mother   . Diabetes type II Father   . Diabetes type II Sister   . Colon cancer Neg Hx    Social History  Substance Use Topics  . Smoking status: Former Smoker    Packs/day: 1.50    Years: 47.00    Types: Cigarettes    Quit date: 09/13/1997  . Smokeless tobacco:  Never Used  . Alcohol use 10.2 oz/week    17 Shots of liquor per week     Comment: 07/17/11 "1/5 rum q weekend"   OB History    No data available     Review of Systems  Constitutional: Positive for fatigue. Negative for fever.  HENT: Positive for ear pain. Negative for rhinorrhea and sore throat.   Eyes: Negative.   Respiratory: Negative for cough and shortness of breath.   Cardiovascular: Negative for chest pain, palpitations and leg swelling.  Gastrointestinal: Negative.   Genitourinary: Negative.   Musculoskeletal: Negative.   Skin: Negative.   Neurological: Positive for dizziness. Negative for syncope, speech difficulty and headaches.  Psychiatric/Behavioral: Positive for sleep disturbance. The patient is nervous/anxious.   All other systems reviewed and are negative.   Allergies  Patient has no known allergies.  Home Medications   Prior to Admission medications   Medication Sig Start Date End Date Taking? Authorizing Provider  ALPHAGAN P 0.1 % SOLN Place 1 drop into the right eye 2 (two) times daily. 10/10/15   Historical Provider, MD  amLODipine (NORVASC) 10 MG tablet Take 1 tablet (10 mg total) by mouth daily. 11/28/15 02/26/16  Eileen Stanford, PA-C  aspirin 81 MG tablet Take 81 mg by mouth daily.    Historical Provider, MD  atorvastatin (LIPITOR) 20 MG tablet Take 1 tablet (  20 mg total) by mouth daily. Changing from simva 11/28/15 02/26/16  Eileen Stanford, PA-C  carvedilol (COREG) 25 MG tablet Take 25 mg by mouth 2 (two) times daily. 08/07/15   Historical Provider, MD  furosemide (LASIX) 20 MG tablet take 1 tablet by mouth once daily 07/16/15   Belva Crome, MD  hydrALAZINE (APRESOLINE) 100 MG tablet Take 100 mg by mouth 2 (two) times daily. 10/10/15   Historical Provider, MD  isosorbide mononitrate (IMDUR) 30 MG 24 hr tablet Take 1 tablet (30 mg total) by mouth daily. 10/12/15 01/10/16  Eileen Stanford, PA-C  lisinopril (PRINIVIL,ZESTRIL) 40 MG tablet Take 40 mg by  mouth daily.    Historical Provider, MD  omeprazole (PRILOSEC) 20 MG capsule Take 1 capsule (20 mg total) by mouth daily. 05/30/15   Belva Crome, MD  TRAVATAN Z 0.004 % SOLN ophthalmic solution Place 1 drop into the right eye at bedtime.  11/24/12   Historical Provider, MD   Meds Ordered and Administered this Visit  Medications - No data to display  BP 149/78 (BP Location: Left Arm)   Pulse 64   Temp 98.2 F (36.8 C)   Resp 20   SpO2 97%  Orthostatic VS for the past 24 hrs:  BP- Lying Pulse- Lying BP- Sitting Pulse- Sitting BP- Standing at 0 minutes Pulse- Standing at 0 minutes  02/05/16 1447 166/76 60 153/75 63 140/75 62    Physical Exam  Constitutional: She is oriented to person, place, and time. She appears well-developed and well-nourished. No distress.  HENT:  Head: Normocephalic and atraumatic.  Mouth/Throat: No oropharyngeal exudate.  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 No signs of acute infection, erythema or drainage.  Eyes:  Left eye prosthetic. Right eye with EOM I and mild erythema to the conjunctiva.  Neck: Normal range of motion. Neck supple.  Negative for carotid bruits  Cardiovascular: Normal rate, normal heart sounds and intact distal pulses.   Few premature beats.  Pulmonary/Chest: Effort normal and breath sounds normal. No respiratory distress. She has no wheezes. She has no rales.  Musculoskeletal: Normal range of motion. She exhibits no edema or deformity.  Lymphadenopathy:    She has no cervical adenopathy.  Neurological: She is alert and oriented to person, place, and time.  Skin: Skin is warm and dry.  Psychiatric: She has a normal mood and affect.  Nursing note and vitals reviewed.   Urgent Care Course   Clinical Course as of Feb 05 1516  Tue Feb 05, 2016  1505 SpO2: 97 % [DM]    Clinical Course User Index [DM] Janne Napoleon, NP    Procedures (including critical care time)  Labs Review Labs  Reviewed  POCT I-STAT, CHEM 8 - Abnormal; Notable for the following:       Result Value   Creatinine, Ser 1.20 (*)    Calcium, Ion 1.09 (*)    Hemoglobin 11.6 (*)    HCT 34.0 (*)    All other components within normal limits    Imaging Review No results found.   Visual Acuity Review  Right Eye Distance:   Left Eye Distance:   Bilateral Distance:    Right Eye Near:   Left Eye Near:    Bilateral Near:         MDM   1. Dizziness   2. Anxiety   3. Weakness    It sounds as though some of the dizziness and weakness  your having is due to your anxiety. Your vital signs have been normal. Your blood test is normal although it shows your chronic anemia. Continue taking your medication and if anything new occurs, new problems he may need to go to the emergency department. Otherwise, follow-up with your primary care doctor as scheduled next month.     Janne Napoleon, NP 02/05/16 [phone]

## 2016-02-05 NOTE — ED Triage Notes (Signed)
Took blood pressure at home and indicated blood pressure is high.  Patient feeling weak.  Left ear pain is only complaint.  Weakness feeling for a month.  Patient sees dr Tamala Julian next month

## 2016-02-19 ENCOUNTER — Ambulatory Visit: Payer: Medicare Other | Admitting: Interventional Cardiology

## 2016-03-27 ENCOUNTER — Other Ambulatory Visit: Payer: Self-pay | Admitting: Interventional Cardiology

## 2016-04-22 NOTE — Progress Notes (Signed)
Cardiology Office Note    Date:  04/23/2016   ID:  [name], DOB [birth year], MRN [id number]  PCP:  Ricke Hey, MD  Cardiologist: Sinclair Grooms, MD   Chief Complaint  Patient presents with  . Follow-up    hypertension  2  History of Present Illness:  [name] is a 83 y.o. female with a history of history of gum cancer s/p radiation in [place] (1990s) HTN, HLD, DMT2, CKD and chronic diastolic CHF who presents to clinic for evaluation of chest pain.  She is doing well. She advocates medication compliance. No side effects. She denies shortness of breath.  Past Medical History:  Diagnosis Date  . Anemia 04/03/2011  . Anginal pain ([place])   . Blind left eye 1999  . Depression   . Exertional dyspnea   . GERD (gastroesophageal reflux disease)   . High cholesterol   . Hip fracture ([place]) 07/17/11   fall from 1-2 feet; left  . Hypertension   . Prosthetic eye globe 1999   "from diabetic; left eye"  . Tongue cancer ([place]) 1998   S/P radiation  . Type II diabetes mellitus ([place])    not taking any medication    Past Surgical History:  Procedure Laterality Date  . HIP ARTHROPLASTY  07/18/2011   Procedure: ARTHROPLASTY BIPOLAR HIP;  Surgeon: Nita Sells, MD;  Location: [facility];  Service: Orthopedics;  Laterality: Left;  . INTRAOCULAR PROSTHESES INSERTION  1999   left  . TONGUE BIOPSY  1998   "cancer"  . TUBAL LIGATION  1970's    Current Medications: Outpatient Medications Prior to Visit  Medication Sig Dispense Refill  . ALPHAGAN P 0.1 % SOLN Place 1 drop into the right eye 2 (two) times daily.    Marland Kitchen aspirin 81 MG tablet Take 81 mg by mouth daily.    . carvedilol (COREG) 25 MG tablet Take 25 mg by mouth 2 (two) times daily.    . furosemide (LASIX) 20 MG tablet Take 1 tablet (20 mg total) by mouth daily. Please keep 04/23/16 appt for further refill authorization 30 tablet 3  . hydrALAZINE (APRESOLINE) 100 MG tablet Take 100 mg by mouth 2 (two) times  daily.    Marland Kitchen lisinopril (PRINIVIL,ZESTRIL) 40 MG tablet Take 40 mg by mouth daily.    Marland Kitchen omeprazole (PRILOSEC) 20 MG capsule Take 1 capsule (20 mg total) by mouth daily. 30 capsule 10  . TRAVATAN Z 0.004 % SOLN ophthalmic solution Place 1 drop into the right eye at bedtime.     Marland Kitchen amLODipine (NORVASC) 10 MG tablet Take 1 tablet (10 mg total) by mouth daily. 90 tablet 3  . atorvastatin (LIPITOR) 20 MG tablet Take 1 tablet (20 mg total) by mouth daily. Changing from simva 90 tablet 3  . isosorbide mononitrate (IMDUR) 30 MG 24 hr tablet Take 1 tablet (30 mg total) by mouth daily. 90 tablet 3   No facility-administered medications prior to visit.      Allergies:   Patient has no known allergies.   Social History   Social History  . Marital status: Single    Spouse name: N/A  . Number of children: N/A  . Years of education: N/A   Social History Main Topics  . Smoking status: Former Smoker    Packs/day: 1.50    Years: 47.00    Types: Cigarettes    Quit date: 09/13/1997  . Smokeless tobacco: Never Used  . Alcohol use 10.2 oz/week  17 Shots of liquor per week     Comment: 07/17/11 "1/5 rum q weekend"  . Drug use: No     Comment: 07/17/11 "last drug use ~ 20 yr ago"  . Sexual activity: Yes   Other Topics Concern  . None   Social History Narrative  . None     Family History:  The patient's family history includes Diabetes type II in her father, mother, and sister.   ROS:   Please see the history of present illness.    Vision disturbance, difficulty with cough, and shortness of breath. Easy bruising  All other systems reviewed and are negative.   PHYSICAL EXAM:   VS:  BP (!) 150/74 (BP Location: Left Arm)   Pulse (!) 58   Ht 5' (1.524 m)   Wt 126 lb 1.9 oz (57.2 kg)   BMI 24.63 kg/m    GEN: Well nourished, well developed, in no acute distress  HEENT: normal  Neck: no JVD, carotid bruits, or masses Cardiac: RRR; no murmurs, rubs, or gallops,no edema  Respiratory:  clear to  auscultation bilaterally, normal work of breathing GI: soft, nontender, nondistended, + BS MS: no deformity or atrophy  Skin: warm and dry, no rash Neuro:  Alert and Oriented x 3, Strength and sensation are intact Psych: euthymic mood, full affect  Wt Readings from Last 3 Encounters:  04/23/16 126 lb 1.9 oz (57.2 kg)  11/14/15 126 lb 12.8 oz (57.5 kg)  10/12/15 124 lb (56.2 kg)      Studies/Labs Reviewed:   EKG:  EKG  Not repeated  Recent Labs: 10/12/2015: Platelets 208; TSH 1.58 02/05/2016: BUN 9; Creatinine, Ser 1.20; Hemoglobin 11.6; Potassium 3.7; Sodium 139   Lipid Panel    Component Value Date/Time   CHOL 198 02/28/2011 1030   TRIG 167 (H) 02/28/2011 1030   HDL 71 02/28/2011 1030   CHOLHDL 2.8 02/28/2011 1030   VLDL 33 02/28/2011 1030   LDLCALC 94 02/28/2011 1030    Additional studies/ records that were reviewed today include:  Nodes from [facility] provided by Dr. Steve Rattler were reviewed in detail. This information concerned the office and, of 03/05/16. BUN and creatinine at that time were 21 and 1.24.    ASSESSMENT:    1. Chronic diastolic heart failure ([place])   2. Essential hypertension   3. Type 2 diabetes mellitus with hyperosmolarity without coma, with long-term current use of insulin (H[facility])   4. Other hyperlipidemia      PLAN:  In order of problems listed above:  1. There is no evidence of volume overload. Blood pressure is reasonably controlled. 2. Fluid and volume restrictions were discussed.  No change in medical therapy. Call if problems. Clinical follow-up in one year.    Medication Adjustments/Labs and Tests Ordered: Current medicines are reviewed at length with the patient today.  Concerns regarding medicines are outlined above.  Medication changes, Labs and Tests ordered today are listed in the Patient Instructions below. There are no Patient Instructions on file for this visit.   Signed, Sinclair Grooms, MD    04/23/2016 11:28 AM    [address], [place], [place]  [address] Phone: [phone]; Fax: [phone]

## 2016-04-23 ENCOUNTER — Encounter: Payer: Self-pay | Admitting: Interventional Cardiology

## 2016-04-23 ENCOUNTER — Ambulatory Visit (INDEPENDENT_AMBULATORY_CARE_PROVIDER_SITE_OTHER): Payer: Medicare Other | Admitting: Interventional Cardiology

## 2016-04-23 VITALS — BP 150/74 | HR 58 | Ht 60.0 in | Wt 126.1 lb

## 2016-04-23 DIAGNOSIS — I5032 Chronic diastolic (congestive) heart failure: Secondary | ICD-10-CM | POA: Diagnosis not present

## 2016-04-23 DIAGNOSIS — E1100 Type 2 diabetes mellitus with hyperosmolarity without nonketotic hyperglycemic-hyperosmolar coma (NKHHC): Secondary | ICD-10-CM

## 2016-04-23 DIAGNOSIS — E784 Other hyperlipidemia: Secondary | ICD-10-CM

## 2016-04-23 DIAGNOSIS — E7849 Other hyperlipidemia: Secondary | ICD-10-CM

## 2016-04-23 DIAGNOSIS — I10 Essential (primary) hypertension: Secondary | ICD-10-CM | POA: Diagnosis not present

## 2016-04-23 DIAGNOSIS — Z794 Long term (current) use of insulin: Secondary | ICD-10-CM

## 2016-04-23 NOTE — Patient Instructions (Signed)

## 2016-07-26 ENCOUNTER — Emergency Department (HOSPITAL_COMMUNITY)
Admission: EM | Admit: 2016-07-26 | Discharge: 2016-07-26 | Disposition: A | Discharge status: Home / Self Care | Payer: Medicare Other | Attending: Emergency Medicine | Admitting: Emergency Medicine

## 2016-07-26 ENCOUNTER — Encounter (HOSPITAL_COMMUNITY): Payer: Self-pay | Admitting: Emergency Medicine

## 2016-07-26 ENCOUNTER — Emergency Department (HOSPITAL_COMMUNITY): Payer: Medicare Other

## 2016-07-26 DIAGNOSIS — S99191A Other physeal fracture of right metatarsal, initial encounter for closed fracture: Secondary | ICD-10-CM | POA: Insufficient documentation

## 2016-07-26 DIAGNOSIS — E785 Hyperlipidemia, unspecified: Secondary | ICD-10-CM | POA: Insufficient documentation

## 2016-07-26 DIAGNOSIS — Z87891 Personal history of nicotine dependence: Secondary | ICD-10-CM | POA: Insufficient documentation

## 2016-07-26 DIAGNOSIS — W01190A Fall on same level from slipping, tripping and stumbling with subsequent striking against furniture, initial encounter: Secondary | ICD-10-CM | POA: Insufficient documentation

## 2016-07-26 DIAGNOSIS — E7800 Pure hypercholesterolemia, unspecified: Secondary | ICD-10-CM | POA: Diagnosis not present

## 2016-07-26 DIAGNOSIS — E119 Type 2 diabetes mellitus without complications: Secondary | ICD-10-CM | POA: Insufficient documentation

## 2016-07-26 DIAGNOSIS — I110 Hypertensive heart disease with heart failure: Secondary | ICD-10-CM | POA: Insufficient documentation

## 2016-07-26 DIAGNOSIS — Y92000 Kitchen of unspecified non-institutional (private) residence as  the place of occurrence of the external cause: Secondary | ICD-10-CM | POA: Insufficient documentation

## 2016-07-26 DIAGNOSIS — Y939 Activity, unspecified: Secondary | ICD-10-CM | POA: Insufficient documentation

## 2016-07-26 DIAGNOSIS — Y998 Other external cause status: Secondary | ICD-10-CM | POA: Diagnosis not present

## 2016-07-26 DIAGNOSIS — I5032 Chronic diastolic (congestive) heart failure: Secondary | ICD-10-CM | POA: Insufficient documentation

## 2016-07-26 DIAGNOSIS — Z79899 Other long term (current) drug therapy: Secondary | ICD-10-CM | POA: Diagnosis not present

## 2016-07-26 DIAGNOSIS — S99921A Unspecified injury of right foot, initial encounter: Secondary | ICD-10-CM | POA: Diagnosis present

## 2016-07-26 MED ORDER — HYDROCODONE-ACETAMINOPHEN 5-325 MG PO TABS
0.5000 | ORAL_TABLET | Freq: Once | ORAL | Status: AC
  Administered 2016-07-26: 0.5 via ORAL
  Filled 2016-07-26: qty 1

## 2016-07-26 MED ORDER — HYDROCODONE-ACETAMINOPHEN 5-325 MG PO TABS: ORAL_TABLET | ORAL | 0 refills | Status: DC

## 2016-07-26 NOTE — ED Provider Notes (Signed)
[facility] DEPT Provider Note   CSN: [id number] Arrival date & time: 07/26/16  1214     History   Chief Complaint Chief Complaint  Patient presents with  . Foot Pain     HPI  Blood pressure 120/65, pulse 72, temperature 98.3 F (36.8 C), temperature source Oral, resp. rate 20, SpO2 99 %.  Ceriah Kirk is a 83 y.o. female complaining of pain to right foot status post mechanical fall last night when she was walking in her kitchen and tripped over a chair. She's not anticoagulated. She denies any head trauma, cervicalgia, chest pain, abdominal pain or hip pain. She states that initially the pain was mild when she woke up this morning she couldn't bear weight on the right foot. She didn't take any pain medication prior to arrival. She lives alone. She doesn't take any narcotics regularly.  Past Medical History:  Diagnosis Date  . Anemia 04/03/2011  . Anginal pain ([place])   . Blind left eye 1999  . Depression   . Exertional dyspnea   . GERD (gastroesophageal reflux disease)   . High cholesterol   . Hip fracture ([place]) 07/17/11   fall from 1-2 feet; left  . Hypertension   . Prosthetic eye globe 1999   "from diabetic; left eye"  . Tongue cancer ([place]) 1998   S/P radiation  . Type II diabetes mellitus ([place])    not taking any medication    Patient Active Problem List   Diagnosis Date Noted  . CAP (community acquired pneumonia)   . Hip swelling 01/17/2015  . Chest tightness 02/17/2014  . Hypertensive urgency 02/17/2014  . Chronic diastolic heart failure ([place]) 03/15/2013  . Bacteremia 07/22/2011  . UTI (lower urinary tract infection) 07/21/2011  . Closed left hip fracture ([place]) 07/17/2011  . HTN (hypertension) 07/17/2011  . DM2 (diabetes mellitus, type 2) ([place]) 07/17/2011  . Hyperlipidemia 07/17/2011  . Anemia 04/03/2011    Past Surgical History:  Procedure Laterality Date  . HIP ARTHROPLASTY  07/18/2011   Procedure: ARTHROPLASTY BIPOLAR HIP;  Surgeon: Nita Sells, MD;  Location: [facility];  Service: Orthopedics;  Laterality: Left;  . INTRAOCULAR PROSTHESES INSERTION  1999   left  . TONGUE BIOPSY  1998   "cancer"  . TUBAL LIGATION  1970's    OB History    No data available       Home Medications    Prior to Admission medications   Medication Sig Start Date End Date Taking? Authorizing Provider  ALPHAGAN P 0.1 % SOLN Place 1 drop into the right eye 2 (two) times daily. 10/10/15   [provider]  amLODipine (NORVASC) 10 MG tablet Take 10 mg by mouth daily.    [provider]  aspirin 81 MG tablet Take 81 mg by mouth daily.    [provider]  atorvastatin (LIPITOR) 20 MG tablet Take 20 mg by mouth daily.    [provider]  carvedilol (COREG) 25 MG tablet Take 25 mg by mouth 2 (two) times daily. 08/07/15   [provider]  furosemide (LASIX) 20 MG tablet Take 1 tablet (20 mg total) by mouth daily. Please keep 04/23/16 appt for further refill authorization 03/27/16   Belva Crome, MD  hydrALAZINE (APRESOLINE) 100 MG tablet Take 100 mg by mouth 2 (two) times daily. 10/10/15   [provider]  HYDROcodone-acetaminophen (NORCO/VICODIN) 5-325 MG tablet Take 0.5 to 1 tablets by mouth every 6 hours as needed for pain. 07/26/16  Williette Loewe, Elmyra Ricks, PA-C  isosorbide mononitrate (IMDUR) 30 MG 24 hr tablet Take 30 mg by mouth daily.    [provider]  lisinopril (PRINIVIL,ZESTRIL) 40 MG tablet Take 40 mg by mouth daily.    [provider]  omeprazole (PRILOSEC) 20 MG capsule Take 1 capsule (20 mg total) by mouth daily. 05/30/15   Belva Crome, MD  TRAVATAN Z 0.004 % SOLN ophthalmic solution Place 1 drop into the right eye at bedtime.  11/24/12   [provider]    Family History Family History  Problem Relation Age of Onset  . Diabetes type II Mother   . Diabetes type II Father   . Diabetes type II Sister   . Colon cancer Neg Hx     Social History Social History    Substance Use Topics  . Smoking status: Former Smoker    Packs/day: 1.50    Years: 47.00    Types: Cigarettes    Quit date: 09/13/1997  . Smokeless tobacco: Never Used  . Alcohol use 10.2 oz/week    17 Shots of liquor per week     Comment: 07/17/11 "1/5 rum q weekend"     Allergies   Patient has no known allergies.   Review of Systems Review of Systems  A complete review of systems was obtained and all systems are negative except as noted in the HPI and PMH.    Physical Exam Updated Vital Signs BP (!) 183/90 (BP Location: Left Arm)   Pulse 77   Temp 98 F (36.7 C) (Oral)   Resp 16   SpO2 98%   Physical Exam  Constitutional: She is oriented to person, place, and time. She appears well-developed and well-nourished. No distress.  HENT:  Head: Normocephalic and atraumatic.  Mouth/Throat: Oropharynx is clear and moist.  Eyes: Pupils are equal, round, and reactive to light. Conjunctivae and EOM are normal.  Neck: Normal range of motion.  Cardiovascular: Normal rate, regular rhythm and intact distal pulses.   Pulmonary/Chest: Effort normal and breath sounds normal.  Abdominal: Soft. There is no tenderness.  Musculoskeletal: Normal range of motion. She exhibits edema and tenderness.       Feet:  Tenderness and swelling is diagrammed. Skin is intact, distally neurovascular intact. No tenderness palpation along the bilateral malleoli.  Neurological: She is alert and oriented to person, place, and time.  Skin: She is not diaphoretic.  Psychiatric: She has a normal mood and affect.  Nursing note and vitals reviewed.    ED Treatments / Results  Labs (all labs ordered are listed, but only abnormal results are displayed) Labs Reviewed - No data to display  EKG  EKG Interpretation None       Radiology Dg Foot Complete Right  Result Date: 07/26/2016 CLINICAL DATA:  Injury.  Foot pain. EXAM: RIGHT FOOT COMPLETE - 3+ VIEW COMPARISON:  None. FINDINGS: There is a  minimally displaced fracture involving the fourth metatarsal. The fracture occurs at the distal metaphysis. There is buckling of the cortex and impaction. Osteopenia. Spurring at the posterior and inferior calcaneus. Vascular calcifications. Superimposed degenerative changes. IMPRESSION: Acute fourth metatarsal fracture. Electronically Signed   By: Marybelle Killings M.D.   On: 07/26/2016 13:27    Procedures Procedures (including critical care time)  Medications Ordered in ED Medications  HYDROcodone-acetaminophen (NORCO/VICODIN) 5-325 MG per tablet 0.5 tablet (0.5 tablets Oral Given 07/26/16 1613)     Initial Impression / Assessment and Plan / ED Course  I have reviewed the  triage vital signs and the nursing notes.  Pertinent labs & imaging results that were available during my care of the patient were reviewed by me and considered in my medical decision making (see chart for details).     Vitals:   07/26/16 1245 07/26/16 1424 07/26/16 1747  BP: (!) 153/96 120/65 (!) 183/90  Pulse: 80 72 77  Resp: 20 20 16   Temp: 98.5 F (36.9 C) 98.3 F (36.8 C) 98 F (36.7 C)  TempSrc: Oral Oral Oral  SpO2: 97% 99% 98%    Medications  HYDROcodone-acetaminophen (NORCO/VICODIN) 5-325 MG per tablet 0.5 tablet (0.5 tablets Oral Given 07/26/16 1613)    [name] is 83 y.o. female presenting with Pain to right but status post mechanical fall last night. There was no other trauma. Patient has a minimally displaced distal right fourth metatarsal fracture. Be given 0.5 Vicodin and placing CamWalker.  Patient is able to ambulate with a walker, I have faxed prescription and they will hopefully be delivered tonight. Her daughter will stay with her. Orthopedic referral given. Extensive discussion of fall precautions.  This is a shared visit with the attending physician who personally evaluated the patient and agrees with the care plan.   Evaluation does not show pathology that would require ongoing  emergent intervention or inpatient treatment. Pt is hemodynamically stable and mentating appropriately. Discussed findings and plan with patient/guardian, who agrees with care plan. All questions answered. Return precautions discussed and outpatient follow up given.    Final Clinical Impressions(s) / ED Diagnoses   Final diagnoses:  Other physeal fracture of right metatarsal, initial encounter for closed fracture    New Prescriptions Discharge Medication List as of 07/26/2016  5:21 PM    START taking these medications   Details  HYDROcodone-acetaminophen (NORCO/VICODIN) 5-325 MG tablet Take 0.5 to 1 tablets by mouth every 6 hours as needed for pain., Print         [name], Charna Elizabeth 07/26/16 1946    Lajean Saver, MD 07/26/16 2143

## 2016-07-26 NOTE — Progress Notes (Signed)
Orthopedic Tech Progress Note Patient Details:  [name] 02-25-1932 [id number]  Ortho Devices Type of Ortho Device: CAM walker Ortho Device/Splint Interventions: Application   [name] 07/26/2016, 4:26 PM

## 2016-07-26 NOTE — ED Triage Notes (Signed)
Pt ambulated with walker ,instructions given to Pt on how to use  Walker. Marland Kitchen

## 2016-07-26 NOTE — ED Triage Notes (Signed)
Case manager paged for walker.

## 2016-07-26 NOTE — Discharge Instructions (Signed)
Take vicodin for breakthrough pain, do not drink alcohol, drive, care for children or do other critical tasks while taking vicodin.   Please be very careful not to fall! The pain medication and walking boot puts you at risk for falls. Please rest as much as possible and try to not stay alone.   Please follow with your primary care doctor in the next 2 days for a check-up. They must obtain records for further management.   Do not hesitate to return to the Emergency Department for any new, worsening or concerning symptoms.

## 2016-07-26 NOTE — ED Triage Notes (Signed)
Pt stated yesterday night she tripped over a chair and now shes having R foot pain. Denies hitting head or LOC. Pt states her foot is hurting and painful to touch.

## 2016-07-31 ENCOUNTER — Encounter (INDEPENDENT_AMBULATORY_CARE_PROVIDER_SITE_OTHER): Payer: Self-pay | Admitting: Physician Assistant

## 2016-07-31 ENCOUNTER — Ambulatory Visit (INDEPENDENT_AMBULATORY_CARE_PROVIDER_SITE_OTHER): Payer: Medicare Other | Admitting: Physician Assistant

## 2016-07-31 DIAGNOSIS — S92301A Fracture of unspecified metatarsal bone(s), right foot, initial encounter for closed fracture: Secondary | ICD-10-CM

## 2016-07-31 NOTE — Progress Notes (Addendum)
Office Visit Note   Patient: [name]           Date of Birth: May 22, 1932           MRN: [id number] Visit Date: 07/31/2016              Requested by: Ricke Hey, MD [place], [place] [address] PCP: Ricke Hey, MD   Assessment & Plan: Visit Diagnoses:  1. Closed non-physeal fracture of metatarsal bone of right foot, unspecified metatarsal, initial encounter     Plan: Plan due to the fact she's having difficulty ambulating in the [employer]. We'll put her in a postop shoe. She'll wear this whenever she is up ambulating. She does not need to wear  The post-op shoe to to bed. See her back in 2 weeks obtain 3 views of the right foot at that time. This is to rule out an occult fracture. Questions were encouraged and answered length.  Follow-Up Instructions: Return in about 2 weeks (around 08/14/2016) for Radiographs.   Orders:  No orders of the defined types were placed in this encounter.  No orders of the defined types were placed in this encounter.     Procedures: No procedures performed   Clinical Data: No additional findings.   Subjective: Chief Complaint  Patient presents with  . Right Foot - Pain    HPI [name] is a 83 year old female who comes in today for follow-up of a right foot fracture. She went to the ER on 07/26/16 due to her foot pain. She states that she fell on Friday remembers hitting a chair. When asked what caused her fall she states that states that she was  just was so'"happy". She denies any loss of consciousness dizziness Shortness of breath or other injury at the time the fall. Radiographs which were obtained in the ER are reviewed today by myself. These show a fourth metacarpal tarsal fracture with a slight  varus deformity. No other fractures identified.  Review of Systems Denies loss of consciousness, dizziness shortness of breath. Positive for chronic chest pain. Otherwise please see history of  present illness  Objective: Vital Signs: There were no vitals taken for this visit.  Physical Exam  Constitutional: She is oriented to person, place, and time. She appears well-developed and well-nourished. No distress.  Pulmonary/Chest: Effort normal.  Neurological: She is alert and oriented to person, place, and time.  Skin: She is not diaphoretic.  Psychiatric: She has a normal mood and affect. Her behavior is normal.    Ortho Exam Right foot dorsal pedal pulses intact. Sensation grossly intact. She has tenderness over the third, fourth, and fifth metatarsals distally. There is no rashes skin lesions,, ulcerations, erythema.She has some edema and slight ecchymosis right foot. Specialty Comments:  No specialty comments available.  Imaging: No results found.   PMFS History: Patient Active Problem List   Diagnosis Date Noted  . CAP (community acquired pneumonia)   . Hip swelling 01/17/2015  . Chest tightness 02/17/2014  . Hypertensive urgency 02/17/2014  . Chronic diastolic heart failure ([place]) 03/15/2013  . Bacteremia 07/22/2011  . UTI (lower urinary tract infection) 07/21/2011  . Closed left hip fracture ([place]) 07/17/2011  . HTN (hypertension) 07/17/2011  . DM2 (diabetes mellitus, type 2) ([place]) 07/17/2011  . Hyperlipidemia 07/17/2011  . Anemia 04/03/2011   Past Medical History:  Diagnosis Date  . Anemia 04/03/2011  . Anginal pain ([place])   . Blind left eye 1999  .  Depression   . Exertional dyspnea   . GERD (gastroesophageal reflux disease)   . High cholesterol   . Hip fracture ([place]) 07/17/11   fall from 1-2 feet; left  . Hypertension   . Prosthetic eye globe 1999   "from diabetic; left eye"  . Tongue cancer ([place]) 1998   S/P radiation  . Type II diabetes mellitus (HCC)    not taking any medication    Family History  Problem Relation Age of Onset  . Diabetes type II Mother   . Diabetes type II Father   . Diabetes type II Sister   . Colon cancer Neg Hx     Past  Surgical History:  Procedure Laterality Date  . HIP ARTHROPLASTY  07/18/2011   Procedure: ARTHROPLASTY BIPOLAR HIP;  Surgeon: Nita Sells, MD;  Location: [facility];  Service: Orthopedics;  Laterality: Left;  . INTRAOCULAR PROSTHESES INSERTION  1999   left  . TONGUE BIOPSY  1998   "cancer"  . TUBAL LIGATION  1970's   Social History   Occupational History  . Not on file.   Social History Main Topics  . Smoking status: Former Smoker    Packs/day: 1.50    Years: 47.00    Types: Cigarettes    Quit date: 09/13/1997  . Smokeless tobacco: Never Used  . Alcohol use 10.2 oz/week    17 Shots of liquor per week     Comment: 07/17/11 "1/5 rum q weekend"  . Drug use: No     Comment: 07/17/11 "last drug use ~ 20 yr ago"  . Sexual activity: Yes

## 2016-08-06 ENCOUNTER — Other Ambulatory Visit: Payer: Self-pay | Admitting: Interventional Cardiology

## 2016-08-13 ENCOUNTER — Ambulatory Visit (INDEPENDENT_AMBULATORY_CARE_PROVIDER_SITE_OTHER): Payer: Medicare Other | Admitting: Orthopaedic Surgery

## 2016-09-01 ENCOUNTER — Ambulatory Visit (INDEPENDENT_AMBULATORY_CARE_PROVIDER_SITE_OTHER): Payer: Medicare Other | Admitting: Orthopaedic Surgery

## 2016-09-05 ENCOUNTER — Other Ambulatory Visit: Payer: Self-pay | Admitting: Interventional Cardiology

## 2016-09-22 ENCOUNTER — Ambulatory Visit (INDEPENDENT_AMBULATORY_CARE_PROVIDER_SITE_OTHER): Payer: Medicare Other

## 2016-09-22 ENCOUNTER — Ambulatory Visit (INDEPENDENT_AMBULATORY_CARE_PROVIDER_SITE_OTHER): Payer: Medicare Other | Admitting: Orthopaedic Surgery

## 2016-09-22 DIAGNOSIS — S92301D Fracture of unspecified metatarsal bone(s), right foot, subsequent encounter for fracture with routine healing: Secondary | ICD-10-CM

## 2016-09-22 NOTE — Progress Notes (Signed)
The patient is a very pleasant 83 year old female who is just under 2 months status post a right foot fourth metatarsal fracture. This is at the metatarsal neck. She is already back to regular shoes and says her foot is doing well except for she hits it against something.  On exam there is no swelling about her right foot. Her pain is minimal over the fifth and fourth rays. The skin is closed.  X-rays of her foot show the fracture is essentially healed with no abnormal findings otherwise.  At this point she'll continue weightbearing as tolerated in regular shoes will follow up as needed. If she has any issues she'll let us know. All questions were encouraged and answered.

## 2016-11-22 ENCOUNTER — Encounter (HOSPITAL_COMMUNITY): Payer: Self-pay | Admitting: Nurse Practitioner

## 2016-11-22 ENCOUNTER — Emergency Department (HOSPITAL_COMMUNITY): Payer: Medicare Other

## 2016-11-22 ENCOUNTER — Other Ambulatory Visit: Payer: Self-pay

## 2016-11-22 ENCOUNTER — Emergency Department (HOSPITAL_COMMUNITY)
Admission: EM | Admit: 2016-11-22 | Discharge: 2016-11-22 | Disposition: A | Discharge status: Home / Self Care | Payer: Medicare Other | Attending: Emergency Medicine | Admitting: Emergency Medicine

## 2016-11-22 DIAGNOSIS — Z79899 Other long term (current) drug therapy: Secondary | ICD-10-CM | POA: Insufficient documentation

## 2016-11-22 DIAGNOSIS — R42 Dizziness and giddiness: Secondary | ICD-10-CM | POA: Diagnosis not present

## 2016-11-22 DIAGNOSIS — Z87891 Personal history of nicotine dependence: Secondary | ICD-10-CM | POA: Insufficient documentation

## 2016-11-22 DIAGNOSIS — I110 Hypertensive heart disease with heart failure: Secondary | ICD-10-CM | POA: Diagnosis not present

## 2016-11-22 DIAGNOSIS — E119 Type 2 diabetes mellitus without complications: Secondary | ICD-10-CM | POA: Diagnosis not present

## 2016-11-22 DIAGNOSIS — Z85810 Personal history of malignant neoplasm of tongue: Secondary | ICD-10-CM | POA: Insufficient documentation

## 2016-11-22 DIAGNOSIS — Z7982 Long term (current) use of aspirin: Secondary | ICD-10-CM | POA: Insufficient documentation

## 2016-11-22 DIAGNOSIS — N390 Urinary tract infection, site not specified: Secondary | ICD-10-CM | POA: Insufficient documentation

## 2016-11-22 DIAGNOSIS — R55 Syncope and collapse: Secondary | ICD-10-CM | POA: Diagnosis present

## 2016-11-22 DIAGNOSIS — B9620 Unspecified Escherichia coli [E. coli] as the cause of diseases classified elsewhere: Secondary | ICD-10-CM | POA: Insufficient documentation

## 2016-11-22 DIAGNOSIS — I5032 Chronic diastolic (congestive) heart failure: Secondary | ICD-10-CM | POA: Insufficient documentation

## 2016-11-22 LAB — COMPREHENSIVE METABOLIC PANEL
ALBUMIN: 3.9 g/dL (ref 3.5–5.0)
ALK PHOS: 82 U/L (ref 38–126)
ALT: 48 U/L (ref 14–54)
AST: 68 U/L — ABNORMAL HIGH (ref 15–41)
Anion gap: 9 (ref 5–15)
BUN: 13 mg/dL (ref 6–20)
CHLORIDE: 107 mmol/L (ref 101–111)
CO2: 22 mmol/L (ref 22–32)
CREATININE: 1.15 mg/dL — AB (ref 0.44–1.00)
Calcium: 9.6 mg/dL (ref 8.9–10.3)
GFR calc Af Amer: 50 mL/min — ABNORMAL LOW (ref >60)
GFR calc non Af Amer: 43 mL/min — ABNORMAL LOW (ref >60)
GLUCOSE: 123 mg/dL — AB (ref 65–99)
Potassium: 4.3 mmol/L (ref 3.5–5.1)
Sodium: 138 mmol/L (ref 135–145)
Total Bilirubin: 0.8 mg/dL (ref 0.3–1.2)
Total Protein: 7.2 g/dL (ref 6.5–8.1)

## 2016-11-22 LAB — URINALYSIS, ROUTINE W REFLEX MICROSCOPIC
BILIRUBIN URINE: NEGATIVE
Glucose, UA: NEGATIVE mg/dL
KETONES UR: NEGATIVE mg/dL
NITRITE: POSITIVE — AB
PROTEIN: 30 mg/dL — AB
RBC / HPF: NONE SEEN RBC/hpf (ref 0–5)
SPECIFIC GRAVITY, URINE: 1.005 (ref 1.005–1.030)
pH: 6 (ref 5.0–8.0)

## 2016-11-22 LAB — CBC WITH DIFFERENTIAL/PLATELET
BASOS ABS: 0 10*3/uL (ref 0.0–0.1)
BASOS PCT: 1 %
EOS ABS: 0.2 10*3/uL (ref 0.0–0.7)
EOS PCT: 3 %
HCT: 40.1 % (ref 36.0–46.0)
HEMOGLOBIN: 13.5 g/dL (ref 12.0–15.0)
Lymphocytes Relative: 24 %
Lymphs Abs: 1.9 10*3/uL (ref 0.7–4.0)
MCH: 30.1 pg (ref 26.0–34.0)
MCHC: 33.7 g/dL (ref 30.0–36.0)
MCV: 89.3 fL (ref 78.0–100.0)
Monocytes Absolute: 0.5 10*3/uL (ref 0.1–1.0)
Monocytes Relative: 7 %
NEUTROS PCT: 67 %
Neutro Abs: 5.3 10*3/uL (ref 1.7–7.7)
PLATELETS: 179 10*3/uL (ref 150–400)
RBC: 4.49 MIL/uL (ref 3.87–5.11)
RDW: 15.7 % — ABNORMAL HIGH (ref 11.5–15.5)
WBC: 8 10*3/uL (ref 4.0–10.5)

## 2016-11-22 MED ORDER — CEPHALEXIN 500 MG PO CAPS: 500.0000 mg | ORAL_CAPSULE | Freq: Three times a day (TID) | ORAL | 0 refills | Status: AC

## 2016-11-22 MED ORDER — SODIUM CHLORIDE 0.9 % IV BOLUS (SEPSIS)
1000.0000 mL | Freq: Once | INTRAVENOUS | Status: AC
  Administered 2016-11-22: 1000 mL via INTRAVENOUS

## 2016-11-22 NOTE — ED Notes (Signed)
ED Provider at bedside. 

## 2016-11-22 NOTE — ED Notes (Signed)
Pt back from X-ray.  

## 2016-11-22 NOTE — ED Provider Notes (Signed)
[facility] EMERGENCY DEPARTMENT Provider Note   CSN: [id number] Arrival date & time: 11/22/16  1259     History   Chief Complaint Chief Complaint  Patient presents with  . Near Syncope    HPI [name] is a 83 y.o. female.  HPI   83 year old female with a history of diabetes, hyperlipidemia, prior tongue cancer status post radiation, hypertension, blindness in the left eye, presents with concern for lightheadedness, cough, sore throat and urinary frequency.  Reports that over the last several weeks she has had good days and bad days, with alternating days of fatigue.  Reports today, she woke up and felt very fatigued.  She has had cough, sore throat and hoarse voice for about 1 week.  Denies significant nasal congestion.  Denies fevers.  Reports that she has some nausea and urinary frequency.  Denies falls, headache, trauma, numbness or weakness on one side or the other, trouble talking or change in vision.  Reports lightheadedness when she went from sitting to standing today.  She did not lose consciousness.  Has not had much to eat or drink today, did have daily coffee.  Reports chronic pain in her hips and her right foot from prior fracture.   Past Medical History:  Diagnosis Date  . Anemia 04/03/2011  . Anginal pain ([place])   . Blind left eye 1999  . Depression   . Exertional dyspnea   . GERD (gastroesophageal reflux disease)   . High cholesterol   . Hip fracture ([place]) 07/17/11   fall from 1-2 feet; left  . Hypertension   . Prosthetic eye globe 1999   "from diabetic; left eye"  . Tongue cancer ([place]) 1998   S/P radiation  . Type II diabetes mellitus ([place])    not taking any medication    Patient Active Problem List   Diagnosis Date Noted  . Closed non-physeal fracture of metatarsal bone of right foot 07/31/2016  . CAP (community acquired pneumonia)   . Hip swelling 01/17/2015  . Chest tightness 02/17/2014  . Hypertensive urgency 02/17/2014  .  Chronic diastolic heart failure ([place]) 03/15/2013  . Bacteremia 07/22/2011  . UTI (lower urinary tract infection) 07/21/2011  . Closed left hip fracture ([place]) 07/17/2011  . HTN (hypertension) 07/17/2011  . DM2 (diabetes mellitus, type 2) ([place]) 07/17/2011  . Hyperlipidemia 07/17/2011  . Anemia 04/03/2011    Past Surgical History:  Procedure Laterality Date  . INTRAOCULAR PROSTHESES INSERTION  1999   left  . TONGUE BIOPSY  1998   "cancer"  . TUBAL LIGATION  1970's    OB History    No data available       Home Medications    Prior to Admission medications   Medication Sig Start Date End Date Taking? Authorizing Provider  ALPHAGAN P 0.1 % SOLN Place 1 drop into the right eye 2 (two) times daily. 10/10/15   [provider]  amLODipine (NORVASC) 10 MG tablet Take 10 mg by mouth daily.    [provider]  aspirin 81 MG tablet Take 81 mg by mouth daily.    [provider]  atorvastatin (LIPITOR) 20 MG tablet Take 20 mg by mouth daily.    [provider]  carvedilol (COREG) 25 MG tablet Take 25 mg by mouth 2 (two) times daily. 08/07/15   [provider]  cephALEXin (KEFLEX) 500 MG capsule Take 1 capsule (500 mg total) 3 (three) times daily for 7 days by mouth. 11/22/16 11/29/16  Gareth Morgan, MD  furosemide (LASIX) 20 MG tablet take 1 tablet by mouth once daily 09/05/16   Belva Crome, MD  hydrALAZINE (APRESOLINE) 100 MG tablet Take 100 mg by mouth 2 (two) times daily. 10/10/15   [provider]  HYDROcodone-acetaminophen (NORCO/VICODIN) 5-325 MG tablet Take 0.5 to 1 tablets by mouth every 6 hours as needed for pain. 07/26/16   Pisciotta, Elmyra Ricks, PA-C  isosorbide mononitrate (IMDUR) 30 MG 24 hr tablet Take 30 mg by mouth daily.    [provider]  lisinopril (PRINIVIL,ZESTRIL) 40 MG tablet Take 40 mg by mouth daily.    [provider]  omeprazole (PRILOSEC) 20 MG capsule take 1 capsule by mouth once daily 08/06/16    Belva Crome, MD  TRAVATAN Z 0.004 % SOLN ophthalmic solution Place 1 drop into the right eye at bedtime.  11/24/12   [provider]    Family History Family History  Problem Relation Age of Onset  . Diabetes type II Mother   . Diabetes type II Father   . Diabetes type II Sister   . Colon cancer Neg Hx     Social History Social History   Tobacco Use  . Smoking status: Former Smoker    Packs/day: 1.50    Years: 47.00    Pack years: 70.50    Types: Cigarettes    Last attempt to quit: 09/13/1997    Years since quitting: 19.2  . Smokeless tobacco: Never Used  Substance Use Topics  . Alcohol use: Yes    Alcohol/week: 10.2 oz    Types: 17 Shots of liquor per week    Comment: 07/17/11 "1/5 rum q weekend"  . Drug use: No    Comment: 07/17/11 "last drug use ~ 20 yr ago"     Allergies   Patient has no known allergies.   Review of Systems Review of Systems  Constitutional: Positive for fatigue. Negative for fever.  HENT: Positive for congestion. Negative for sore throat.   Eyes: Negative for visual disturbance.  Respiratory: Positive for cough. Negative for shortness of breath.   Cardiovascular: Negative for chest pain.  Gastrointestinal: Positive for nausea. Negative for abdominal pain, blood in stool, constipation, diarrhea and vomiting.  Genitourinary: Positive for frequency. Negative for difficulty urinating and dysuria.  Musculoskeletal: Positive for arthralgias. Negative for back pain and neck pain.  Skin: Negative for rash.  Neurological: Positive for light-headedness. Negative for syncope, speech difficulty, weakness, numbness and headaches.     Physical Exam Updated Vital Signs BP (!) 176/84 (BP Location: Left Arm)   Pulse 75   Temp 98.3 F (36.8 C) (Oral)   Resp 20   Ht 5' (1.524 m)   Wt 57.2 kg (126 lb)   SpO2 95%   BMI 24.61 kg/m   Physical Exam  Constitutional: She is oriented to person, place, and time. She appears well-developed and  well-nourished. No distress.  HENT:  Head: Normocephalic and atraumatic.  Eyes: Conjunctivae and EOM are normal.  Neck: Normal range of motion.  Cardiovascular: Normal rate, regular rhythm, normal heart sounds and intact distal pulses. Exam reveals no gallop and no friction rub.  No murmur heard. Pulmonary/Chest: Effort normal and breath sounds normal. No respiratory distress. She has no wheezes. She has no rales.  Abdominal: Soft. She exhibits no distension. There is no tenderness. There is no guarding.  Musculoskeletal: She exhibits no edema or tenderness.  Neurological: She is alert and oriented to person, place, and time.  Skin:  Skin is warm and dry. No rash noted. She is not diaphoretic. No erythema.  Nursing note and vitals reviewed.    ED Treatments / Results  Labs (all labs ordered are listed, but only abnormal results are displayed) Labs Reviewed  URINALYSIS, ROUTINE W REFLEX MICROSCOPIC - Abnormal; Notable for the following components:      Result Value   APPearance HAZY (*)    Hgb urine dipstick SMALL (*)    Protein, ur 30 (*)    Nitrite POSITIVE (*)    Leukocytes, UA TRACE (*)    Bacteria, UA FEW (*)    Squamous Epithelial / LPF 0-5 (*)    All other components within normal limits  CBC WITH DIFFERENTIAL/PLATELET - Abnormal; Notable for the following components:   RDW 15.7 (*)    All other components within normal limits  COMPREHENSIVE METABOLIC PANEL - Abnormal; Notable for the following components:   Glucose, Bld 123 (*)    Creatinine, Ser 1.15 (*)    AST 68 (*)    GFR calc non Af Amer 43 (*)    GFR calc Af Amer 50 (*)    All other components within normal limits  URINE CULTURE    EKG  EKG Interpretation  Date/Time:  Saturday November 22 2016 13:04:05 EST Ventricular Rate:  80 PR Interval:    QRS Duration: 93 QT Interval:  427 QTC Calculation: 493 R Axis:   -15 Text Interpretation:  Sinus arrhythmia Ventricular premature complex LVH with secondary  repolarization abnormality Borderline prolonged QT interval No significant change since last tracing Confirmed by Gareth Morgan [phone]) on 11/22/2016 1:37:07 PM       Radiology Dg Chest 2 View  Result Date: 11/22/2016 CLINICAL DATA:  Weakness. EXAM: CHEST  2 VIEW COMPARISON:  01/17/2015 FINDINGS: The cardiac silhouette is enlarged. Mediastinal contours appear intact. Calcific atherosclerotic disease and tortuosity of the aorta. There is no evidence of focal airspace consolidation, pleural effusion or pneumothorax. Osseous structures are without acute abnormality. Soft tissues are grossly normal. IMPRESSION: Enlarged cardiac silhouette. Calcific atherosclerotic disease and tortuosity of the aorta. Electronically Signed   By: Fidela Salisbury M.D.   On: 11/22/2016 14:31    Procedures Procedures (including critical care time)  Medications Ordered in ED Medications  sodium chloride 0.9 % bolus 1,000 mL (0 mLs Intravenous Stopped 11/22/16 1610)     Initial Impression / Assessment and Plan / ED Course  I have reviewed the triage vital signs and the nursing notes.  Pertinent labs & imaging results that were available during my care of the patient were reviewed by me and considered in my medical decision making (see chart for details).    83 year old female with a history of diabetes, hyperlipidemia, prior tongue cancer status post radiation, hypertension, blindness in the left eye, presents with concern for lightheadedness, cough, sore throat and urinary frequency.  Describes lightheadedness and fatigue, without other focal neurologic deficits, have low suspicion for CVA.  Labs show no sign of anemia, no significant electrolyte abnormalities.  Chest x-ray shows no sign of pneumonia.  EKG shows PVCs with no other acute abnormalities.  Reports urinary frequency, and urinalysis shows positive nitrites, concerning for UTI.  Feel patient's fatigue and lightheadedness are likely secondary to  viral URI and urinary tract infection.  She feels improved after IV hydration in the emergency department.  Recommend continued hydration, Keflex for 1 week, and close PCP follow-up.  Final Clinical Impressions(s) / ED Diagnoses   Final diagnoses:  Lightheadedness  Urinary tract infection without hematuria, site unspecified    ED Discharge Orders        Ordered    cephALEXin (KEFLEX) 500 MG capsule  3 times daily     11/22/16 1606       Gareth Morgan, MD 11/22/16 1708

## 2016-11-22 NOTE — ED Notes (Signed)
Patient transported to X-ray 

## 2016-11-22 NOTE — ED Triage Notes (Addendum)
Per EMS pt from home lives independently. Patient endorses lightheadedness started last night and this morning had near syncopal episode with no LOC, fall or injury. Pt has hx of HTN and did not take meds this morning. 12 Lead unremarkable. No neuro deficits noted. Pt endorses cough x 1 week- lung sounds clear throughout

## 2016-11-24 LAB — URINE CULTURE: Culture: >=100000 — AB

## 2016-11-25 ENCOUNTER — Telehealth: Payer: Self-pay | Admitting: Emergency Medicine

## 2016-11-25 NOTE — Telephone Encounter (Signed)
Post ED Visit - Positive Culture Follow-up  Culture report reviewed by antimicrobial stewardship pharmacist:  []  Elenor Quinones, Pharm.D. []  Heide Guile, Pharm.D., BCPS AQ-ID []  Parks Neptune, Pharm.D., BCPS []  Alycia Rossetti, Pharm.D., BCPS []  [place], Pharm.D., BCPS, AAHIVP []  Legrand Como, Pharm.D., BCPS, AAHIVP []  Salome Arnt, PharmD, BCPS []  Dimitri Ped, PharmD, BCPS []  Vincenza Hews, PharmD, BCPS  Positive urine  culture Treated with cephalexin, organism sensitive to the same and no further patient follow-up is required at this time.  Hazle Nordmann 11/25/2016, 12:20 PM

## 2017-02-12 ENCOUNTER — Other Ambulatory Visit: Payer: Self-pay | Admitting: Physician Assistant

## 2017-05-13 ENCOUNTER — Ambulatory Visit (INDEPENDENT_AMBULATORY_CARE_PROVIDER_SITE_OTHER): Payer: Medicare Other | Admitting: Interventional Cardiology

## 2017-05-13 ENCOUNTER — Encounter: Payer: Self-pay | Admitting: Interventional Cardiology

## 2017-05-13 VITALS — BP 156/70 | HR 73 | Ht 60.0 in | Wt 131.4 lb

## 2017-05-13 DIAGNOSIS — I10 Essential (primary) hypertension: Secondary | ICD-10-CM

## 2017-05-13 DIAGNOSIS — I639 Cerebral infarction, unspecified: Secondary | ICD-10-CM

## 2017-05-13 DIAGNOSIS — R002 Palpitations: Secondary | ICD-10-CM

## 2017-05-13 DIAGNOSIS — I5032 Chronic diastolic (congestive) heart failure: Secondary | ICD-10-CM | POA: Diagnosis not present

## 2017-05-13 DIAGNOSIS — E7849 Other hyperlipidemia: Secondary | ICD-10-CM

## 2017-05-13 DIAGNOSIS — I480 Paroxysmal atrial fibrillation: Secondary | ICD-10-CM | POA: Diagnosis not present

## 2017-05-13 HISTORY — DX: Cerebral infarction, unspecified: I63.9

## 2017-05-13 NOTE — Progress Notes (Signed)
Cardiology Office Note    Date:  05/13/2017   ID:  [name], DOB Feb 22, 1932, MRN [id number]  PCP:  Ricke Hey, MD  Cardiologist: Sinclair Grooms, MD   Chief Complaint  Patient presents with  . Congestive Heart Failure    History of Present Illness:  [name] is a 84 y.o. female with a history of history of gum cancer s/p radiation in [place] (1990s) HTN, HLD, DMT2, CKD and chronic diastolic CHF who presents to clinic for evaluation of chest pain.   Feels she has fallen apart since she turned 84 years of age.  She has exertional fatigue, dyspnea, aching all over, and "anxiety".  States anxiety is when her heart takes off and starts racing even though she is not doing anything.  This happens at least several times per week.  She feels that her heart is racing currently but only exam heart rate is in the 80s maximum.  Rhythm is regular.  She is currently not short of breath, does not feel that she is having anxiety.   Past Medical History:  Diagnosis Date  . Anemia 04/03/2011  . Anginal pain ([place])   . Blind left eye 1999  . Depression   . Exertional dyspnea   . GERD (gastroesophageal reflux disease)   . High cholesterol   . Hip fracture ([place]) 07/17/11   fall from 1-2 feet; left  . Hypertension   . Prosthetic eye globe 1999   "from diabetic; left eye"  . Tongue cancer ([place]) 1998   S/P radiation  . Type II diabetes mellitus ([place])    not taking any medication    Past Surgical History:  Procedure Laterality Date  . HIP ARTHROPLASTY  07/18/2011   Procedure: ARTHROPLASTY BIPOLAR HIP;  Surgeon: Nita Sells, MD;  Location: [facility];  Service: Orthopedics;  Laterality: Left;  . INTRAOCULAR PROSTHESES INSERTION  1999   left  . TONGUE BIOPSY  1998   "cancer"  . TUBAL LIGATION  1970's    Current Medications: Outpatient Medications Prior to Visit  Medication Sig Dispense Refill  . ALPHAGAN P 0.1 % SOLN Place 1 drop into the right eye 2 (two) times  daily.    Marland Kitchen amLODipine (NORVASC) 10 MG tablet Take 10 mg by mouth daily.    Marland Kitchen aspirin 81 MG tablet Take 81 mg by mouth daily.    Marland Kitchen atorvastatin (LIPITOR) 20 MG tablet take 1 tablet by mouth once daily (CHANGING FROM SIMVASTATIN) 90 tablet 0  . furosemide (LASIX) 20 MG tablet take 1 tablet by mouth once daily 30 tablet 7  . gabapentin (NEURONTIN) 100 MG capsule Take 100 mg by mouth 3 (three) times daily.  3  . HYDROcodone-acetaminophen (NORCO/VICODIN) 5-325 MG tablet Take 0.5 to 1 tablets by mouth every 6 hours as needed for pain. 17 tablet 0  . lisinopril (PRINIVIL,ZESTRIL) 40 MG tablet Take 40 mg by mouth daily.    Marland Kitchen omeprazole (PRILOSEC) 20 MG capsule take 1 capsule by mouth once daily 30 capsule 8  . TRAVATAN Z 0.004 % SOLN ophthalmic solution Place 1 drop into the right eye at bedtime.     Marland Kitchen amLODipine (NORVASC) 10 MG tablet take 1 tablet by mouth once daily 90 tablet 0  . atorvastatin (LIPITOR) 20 MG tablet Take 20 mg by mouth daily.    . carvedilol (COREG) 25 MG tablet Take 25 mg by mouth 2 (two) times daily.    . hydrALAZINE (APRESOLINE) 100 MG tablet Take 100  mg by mouth 2 (two) times daily.    . isosorbide mononitrate (IMDUR) 30 MG 24 hr tablet Take 30 mg by mouth daily.     No facility-administered medications prior to visit.      Allergies:   Patient has no known allergies.   Social History   Socioeconomic History  . Marital status: Single    Spouse name: Not on file  . Number of children: Not on file  . Years of education: Not on file  . Highest education level: Not on file  Occupational History  . Not on file  Social Needs  . Financial resource strain: Not on file  . Food insecurity:    Worry: Not on file    Inability: Not on file  . Transportation needs:    Medical: Not on file    Non-medical: Not on file  Tobacco Use  . Smoking status: Former Smoker    Packs/day: 1.50    Years: 47.00    Pack years: 70.50    Types: Cigarettes    Last attempt to quit:  09/13/1997    Years since quitting: 19.6  . Smokeless tobacco: Never Used  Substance and Sexual Activity  . Alcohol use: Yes    Alcohol/week: 10.2 oz    Types: 17 Shots of liquor per week    Comment: 07/17/11 "1/5 rum q weekend"  . Drug use: No    Types: Marijuana, Heroin    Comment: 07/17/11 "last drug use ~ 20 yr ago"  . Sexual activity: Yes  Lifestyle  . Physical activity:    Days per week: Not on file    Minutes per session: Not on file  . Stress: Not on file  Relationships  . Social connections:    Talks on phone: Not on file    Gets together: Not on file    Attends religious service: Not on file    Active member of club or organization: Not on file    Attends meetings of clubs or organizations: Not on file    Relationship status: Not on file  Other Topics Concern  . Not on file  Social History Narrative  . Not on file     Family History:  The patient's family history includes Diabetes type II in her father, mother, and sister.   ROS:   Please see the history of present illness.    Cramping in her legs, and feels as if she is having anxiety.  When pressed on the anxiety complaint she states that it feels like her heart is racing and she can feel it pounding and racing up into her neck.  Feels it is present at the current time but is very mild All other systems reviewed and are negative.   PHYSICAL EXAM:   VS:  BP (!) 156/70   Pulse 73   Ht 5' (1.524 m)   Wt 131 lb 6.4 oz (59.6 kg)   BMI 25.66 kg/m    GEN: Well nourished, well developed, in no acute distress.  Alopecia. HEENT: normal  Neck: no JVD, carotid bruits, or masses Cardiac: RRR; no murmurs, rubs, or gallops,no edema  Respiratory:  clear to auscultation bilaterally, normal work of breathing GI: soft, nontender, nondistended, + BS MS: no deformity or atrophy  Skin: warm and dry, no rash Neuro:  Alert and Oriented x 3, Strength and sensation are intact Psych: euthymic mood, full affect  Wt Readings from  Last 3 Encounters:  05/13/17 131 lb 6.4 oz (59.6  kg)  11/22/16 126 lb (57.2 kg)  04/23/16 126 lb 1.9 oz (57.2 kg)      Studies/Labs Reviewed:   EKG:  EKG  none  Recent Labs: 11/22/2016: ALT 48; BUN 13; Creatinine, Ser 1.15; Hemoglobin 13.5; Platelets 179; Potassium 4.3; Sodium 138   Lipid Panel    Component Value Date/Time   CHOL 198 02/28/2011 1030   TRIG 167 (H) 02/28/2011 1030   HDL 71 02/28/2011 1030   CHOLHDL 2.8 02/28/2011 1030   VLDL 33 02/28/2011 1030   LDLCALC 94 02/28/2011 1030    Additional studies/ records that were reviewed today include:  none    ASSESSMENT:    1. Essential hypertension   2. Paroxysmal atrial fibrillation (HCC)   3. Chronic diastolic heart failure (HCC)   4. Other hyperlipidemia      PLAN:  In order of problems listed above:  1. Blood pressure is elevated and she is not on many of the medications contained on the problem list: Not taking hydralazine, carvedilol, or isosorbide. 2. 48-hour Holter to rule out PAF. 3. No diastolic heart failure/volume overload on current exam.  Clinical follow-up in 1 month after 48-hour Holter.  If blood pressure remains elevated and no contraindication may need to resume low-dose beta-blocker therapy.  If atrial fib or PSVT identified therapy will be started accordingly.    Medication Adjustments/Labs and Tests Ordered: Current medicines are reviewed at length with the patient today.  Concerns regarding medicines are outlined above.  Medication changes, Labs and Tests ordered today are listed in the Patient Instructions below. There are no Patient Instructions on file for this visit.   Signed, Sinclair Grooms, MD  05/13/2017 11:10 AM    [facility] [address], [place], [place]  [address] Phone: [phone]; Fax: [phone]

## 2017-05-13 NOTE — Patient Instructions (Signed)
Medication Instructions:  Your physician recommends that you continue on your current medications as directed. Please refer to the Current Medication list given to you today.  Labwork: None  Testing/Procedures: Your physician has recommended that you wear a 48 hour holter monitor. Holter monitors are medical devices that record the heart's electrical activity. Doctors most often use these monitors to diagnose arrhythmias. Arrhythmias are problems with the speed or rhythm of the heartbeat. The monitor is a small, portable device. You can wear one while you do your normal daily activities. This is usually used to diagnose what is causing palpitations/syncope (passing out).   Follow-Up: Your physician recommends that you schedule a follow-up appointment in: 1 month with a PA or NP.  Your physician wants you to follow-up in: 1 year with Dr. Tamala Julian.  You will receive a reminder letter in the mail two months in advance. If you don't receive a letter, please call our office to schedule the follow-up appointment.    Any Other Special Instructions Will Be Listed Below (If Applicable).     If you need a refill on your cardiac medications before your next appointment, please call your pharmacy.

## 2017-05-25 ENCOUNTER — Ambulatory Visit (INDEPENDENT_AMBULATORY_CARE_PROVIDER_SITE_OTHER): Payer: Medicare Other

## 2017-05-25 DIAGNOSIS — R002 Palpitations: Secondary | ICD-10-CM

## 2017-05-29 ENCOUNTER — Encounter: Payer: Self-pay | Admitting: Cardiology

## 2017-06-02 ENCOUNTER — Other Ambulatory Visit: Payer: Self-pay

## 2017-06-02 ENCOUNTER — Emergency Department (HOSPITAL_COMMUNITY): Payer: Medicare Other

## 2017-06-02 ENCOUNTER — Observation Stay (HOSPITAL_COMMUNITY)
Admission: EM | Admit: 2017-06-02 | Discharge: 2017-06-05 | Disposition: A | Discharge status: Home / Self Care | Payer: Medicare Other | Attending: Internal Medicine | Admitting: Internal Medicine

## 2017-06-02 ENCOUNTER — Encounter (HOSPITAL_COMMUNITY): Payer: Self-pay | Admitting: Emergency Medicine

## 2017-06-02 DIAGNOSIS — I130 Hypertensive heart and chronic kidney disease with heart failure and stage 1 through stage 4 chronic kidney disease, or unspecified chronic kidney disease: Secondary | ICD-10-CM | POA: Insufficient documentation

## 2017-06-02 DIAGNOSIS — Z7982 Long term (current) use of aspirin: Secondary | ICD-10-CM | POA: Insufficient documentation

## 2017-06-02 DIAGNOSIS — Z79899 Other long term (current) drug therapy: Secondary | ICD-10-CM | POA: Diagnosis not present

## 2017-06-02 DIAGNOSIS — E1159 Type 2 diabetes mellitus with other circulatory complications: Secondary | ICD-10-CM | POA: Diagnosis present

## 2017-06-02 DIAGNOSIS — E1151 Type 2 diabetes mellitus with diabetic peripheral angiopathy without gangrene: Secondary | ICD-10-CM | POA: Insufficient documentation

## 2017-06-02 DIAGNOSIS — R079 Chest pain, unspecified: Secondary | ICD-10-CM | POA: Diagnosis not present

## 2017-06-02 DIAGNOSIS — Z87891 Personal history of nicotine dependence: Secondary | ICD-10-CM | POA: Insufficient documentation

## 2017-06-02 DIAGNOSIS — F1019 Alcohol abuse with unspecified alcohol-induced disorder: Secondary | ICD-10-CM | POA: Diagnosis not present

## 2017-06-02 DIAGNOSIS — I639 Cerebral infarction, unspecified: Principal | ICD-10-CM

## 2017-06-02 DIAGNOSIS — N182 Chronic kidney disease, stage 2 (mild): Secondary | ICD-10-CM | POA: Diagnosis present

## 2017-06-02 DIAGNOSIS — I160 Hypertensive urgency: Secondary | ICD-10-CM | POA: Diagnosis present

## 2017-06-02 DIAGNOSIS — F1010 Alcohol abuse, uncomplicated: Secondary | ICD-10-CM

## 2017-06-02 DIAGNOSIS — I5032 Chronic diastolic (congestive) heart failure: Secondary | ICD-10-CM | POA: Diagnosis not present

## 2017-06-02 HISTORY — DX: Unspecified osteoarthritis, unspecified site: M19.90

## 2017-06-02 HISTORY — DX: Anxiety disorder, unspecified: F41.9

## 2017-06-02 HISTORY — DX: Cerebral infarction, unspecified: I63.9

## 2017-06-02 LAB — HEPATIC FUNCTION PANEL
ALBUMIN: 3.8 g/dL (ref 3.5–5.0)
ALT: 61 U/L — AB (ref 14–54)
AST: 75 U/L — AB (ref 15–41)
Alkaline Phosphatase: 85 U/L (ref 38–126)
BILIRUBIN DIRECT: 0.3 mg/dL (ref 0.1–0.5)
BILIRUBIN TOTAL: 0.9 mg/dL (ref 0.3–1.2)
Indirect Bilirubin: 0.6 mg/dL (ref 0.3–0.9)
Total Protein: 8.1 g/dL (ref 6.5–8.1)

## 2017-06-02 LAB — BASIC METABOLIC PANEL
Anion gap: 13 (ref 5–15)
BUN: 15 mg/dL (ref 6–20)
CHLORIDE: 109 mmol/L (ref 101–111)
CO2: 17 mmol/L — AB (ref 22–32)
Calcium: 9.1 mg/dL (ref 8.9–10.3)
Creatinine, Ser: 1.15 mg/dL — ABNORMAL HIGH (ref 0.44–1.00)
GFR calc Af Amer: 50 mL/min — ABNORMAL LOW (ref >60)
GFR calc non Af Amer: 43 mL/min — ABNORMAL LOW (ref >60)
GLUCOSE: 175 mg/dL — AB (ref 65–99)
POTASSIUM: 4.1 mmol/L (ref 3.5–5.1)
Sodium: 139 mmol/L (ref 135–145)

## 2017-06-02 LAB — CBC
HEMATOCRIT: 44.4 % (ref 36.0–46.0)
HEMOGLOBIN: 14.7 g/dL (ref 12.0–15.0)
MCH: 29.1 pg (ref 26.0–34.0)
MCHC: 33.1 g/dL (ref 30.0–36.0)
MCV: 87.9 fL (ref 78.0–100.0)
Platelets: 254 10*3/uL (ref 150–400)
RBC: 5.05 MIL/uL (ref 3.87–5.11)
RDW: 14.4 % (ref 11.5–15.5)
WBC: 5.9 10*3/uL (ref 4.0–10.5)

## 2017-06-02 LAB — RAPID URINE DRUG SCREEN, HOSP PERFORMED
Amphetamines: NOT DETECTED
BARBITURATES: NOT DETECTED
Benzodiazepines: NOT DETECTED
Cocaine: NOT DETECTED
Opiates: NOT DETECTED
TETRAHYDROCANNABINOL: NOT DETECTED

## 2017-06-02 LAB — ETHANOL: Alcohol, Ethyl (B): <10 mg/dL (ref <10)

## 2017-06-02 LAB — URINALYSIS, ROUTINE W REFLEX MICROSCOPIC
BILIRUBIN URINE: NEGATIVE
GLUCOSE, UA: NEGATIVE mg/dL
KETONES UR: NEGATIVE mg/dL
Nitrite: POSITIVE — AB
PH: 7 (ref 5.0–8.0)
Protein, ur: 100 mg/dL — AB
SPECIFIC GRAVITY, URINE: 1.005 (ref 1.005–1.030)

## 2017-06-02 LAB — I-STAT TROPONIN, ED: Troponin i, poc: 0.01 ng/mL (ref 0.00–0.08)

## 2017-06-02 LAB — PROTIME-INR
INR: 0.91
Prothrombin Time: 12.2 seconds (ref 11.4–15.2)

## 2017-06-02 LAB — D-DIMER, QUANTITATIVE: D-Dimer, Quant: 2.11 ug/mL-FEU — ABNORMAL HIGH (ref 0.00–0.50)

## 2017-06-02 MED ORDER — INSULIN ASPART 100 UNIT/ML ~~LOC~~ SOLN
0.0000 [IU] | Freq: Three times a day (TID) | SUBCUTANEOUS | Status: DC
  Administered 2017-06-03: 1 [IU] via SUBCUTANEOUS

## 2017-06-02 MED ORDER — AMLODIPINE BESYLATE 10 MG PO TABS
10.0000 mg | ORAL_TABLET | Freq: Every day | ORAL | Status: DC
  Administered 2017-06-03: 10 mg via ORAL
  Filled 2017-06-02 (×2): qty 1

## 2017-06-02 MED ORDER — ENOXAPARIN SODIUM 40 MG/0.4ML ~~LOC~~ SOLN
40.0000 mg | Freq: Every day | SUBCUTANEOUS | Status: DC
  Administered 2017-06-03 – 2017-06-04 (×2): 40 mg via SUBCUTANEOUS
  Filled 2017-06-02 (×2): qty 0.4

## 2017-06-02 MED ORDER — ATORVASTATIN CALCIUM 10 MG PO TABS
20.0000 mg | ORAL_TABLET | Freq: Every day | ORAL | Status: DC
  Administered 2017-06-03 – 2017-06-04 (×2): 20 mg via ORAL
  Filled 2017-06-02 (×2): qty 2

## 2017-06-02 MED ORDER — NITROGLYCERIN 0.4 MG SL SUBL: 0.4000 mg | SUBLINGUAL_TABLET | SUBLINGUAL | Status: DC | PRN

## 2017-06-02 MED ORDER — GABAPENTIN 100 MG PO CAPS
100.0000 mg | ORAL_CAPSULE | Freq: Three times a day (TID) | ORAL | Status: DC
  Administered 2017-06-03 – 2017-06-05 (×7): 100 mg via ORAL
  Filled 2017-06-02 (×7): qty 1

## 2017-06-02 MED ORDER — HYDRALAZINE HCL 20 MG/ML IJ SOLN: 10.0000 mg | INTRAMUSCULAR | Status: DC | PRN

## 2017-06-02 MED ORDER — METOPROLOL TARTRATE 5 MG/5ML IV SOLN
5.0000 mg | Freq: Three times a day (TID) | INTRAVENOUS | Status: DC
  Administered 2017-06-02 – 2017-06-03 (×3): 5 mg via INTRAVENOUS
  Filled 2017-06-02 (×3): qty 5

## 2017-06-02 MED ORDER — BRIMONIDINE TARTRATE 0.15 % OP SOLN
1.0000 [drp] | Freq: Two times a day (BID) | OPHTHALMIC | Status: DC
  Administered 2017-06-02 – 2017-06-05 (×6): 1 [drp] via OPHTHALMIC
  Filled 2017-06-02: qty 5

## 2017-06-02 MED ORDER — LISINOPRIL 20 MG PO TABS
40.0000 mg | ORAL_TABLET | Freq: Every day | ORAL | Status: DC
  Administered 2017-06-03: 40 mg via ORAL
  Filled 2017-06-02 (×2): qty 2

## 2017-06-02 MED ORDER — ISOSORBIDE MONONITRATE ER 30 MG PO TB24
30.0000 mg | ORAL_TABLET | Freq: Every day | ORAL | Status: DC
  Administered 2017-06-03 – 2017-06-05 (×3): 30 mg via ORAL
  Filled 2017-06-02 (×3): qty 1

## 2017-06-02 MED ORDER — LATANOPROST 0.005 % OP SOLN
1.0000 [drp] | Freq: Every day | OPHTHALMIC | Status: DC
  Administered 2017-06-02 – 2017-06-05 (×4): 1 [drp] via OPHTHALMIC
  Filled 2017-06-02: qty 2.5

## 2017-06-02 MED ORDER — ASPIRIN 300 MG RE SUPP: 300.0000 mg | Freq: Every day | RECTAL | Status: DC

## 2017-06-02 MED ORDER — HYDRALAZINE HCL 50 MG PO TABS
100.0000 mg | ORAL_TABLET | Freq: Two times a day (BID) | ORAL | Status: DC
  Administered 2017-06-03: 100 mg via ORAL
  Filled 2017-06-02: qty 2

## 2017-06-02 MED ORDER — ACETAMINOPHEN 650 MG RE SUPP: 650.0000 mg | RECTAL | Status: DC | PRN

## 2017-06-02 MED ORDER — ACETAMINOPHEN 160 MG/5ML PO SOLN: 650.0000 mg | ORAL | Status: DC | PRN

## 2017-06-02 MED ORDER — ASPIRIN 325 MG PO TABS
325.0000 mg | ORAL_TABLET | Freq: Every day | ORAL | Status: DC
  Administered 2017-06-03 – 2017-06-05 (×3): 325 mg via ORAL
  Filled 2017-06-02 (×3): qty 1

## 2017-06-02 MED ORDER — ACETAMINOPHEN 325 MG PO TABS: 650.0000 mg | ORAL_TABLET | ORAL | Status: DC | PRN

## 2017-06-02 NOTE — H&P (Addendum)
History and Physical    [name] GYI:[id number] DOB: 10/16/1932 DOA: 06/02/2017  PCP: Dierdre Harness, FNP  Patient coming from: Home.  Chief Complaint: Chest pain.  HPI: [name] is a 84 y.o. female with history of diastolic CHF, diabetes mellitus, hypertension, chronic kidney disease stage III, history of lung cancer status post radiation, history of alcohol abuse presents to the ER with complaint of chest pain.  Patient states she has been having chest pain over the last 1 month with shortness of breath symptoms are mostly on exertion at times also happens on at rest.  Patient also over the last 2 to 3 months has been having difficulty walking with balance issues.  Denies any weakness of the upper or lower extremity or loss of function.  Denies any visual symptoms difficulty speaking.  Patient did complain to the nurse that time she gets shocklike feeling when she eats.  Presently chest pain-free.  Patient had followed up with cardiologist Dr. Tamala Julian 3 weeks ago and at that time patient had complained of chest pain and palpitations.  Patient had a Holter monitor done results of which are not known.  Holter monitor was done to rule out paroxysmal atrial fibrillation.  Patient at that time was noticed to be not taking her blood pressure medications.  ED Course: In the ER patient blood pressure was mildly elevated.  Chest x-ray did not show anything acute EKG showing normal sinus rhythm troponin was negative.  Since patient also complained of ataxia MRI of the brain was done which did not show any acute findings but did show new cerebellar infarcts and left hemi-pons infarct which are new compared to the MRI done in 2011.  Patient presently is not in distress.  Patient did fail swallow evaluation.  Review of Systems: As per HPI, rest all negative.   Past Medical History:  Diagnosis Date  . Anemia 04/03/2011  . Anginal pain ([place])   . Blind left eye 1999  . Depression   .  Exertional dyspnea   . GERD (gastroesophageal reflux disease)   . High cholesterol   . Hip fracture ([place]) 07/17/11   fall from 1-2 feet; left  . Hypertension   . Prosthetic eye globe 1999   "from diabetic; left eye"  . Tongue cancer ([place]) 1998   S/P radiation  . Type II diabetes mellitus ([place])    not taking any medication    Past Surgical History:  Procedure Laterality Date  . HIP ARTHROPLASTY  07/18/2011   Procedure: ARTHROPLASTY BIPOLAR HIP;  Surgeon: Nita Sells, MD;  Location: [facility];  Service: Orthopedics;  Laterality: Left;  . INTRAOCULAR PROSTHESES INSERTION  1999   left  . TONGUE BIOPSY  1998   "cancer"  . TUBAL LIGATION  1970's     reports that she quit smoking about 19 years ago. Her smoking use included cigarettes. She has a 70.50 pack-year smoking history. She has never used smokeless tobacco. She reports that she drinks about 10.2 oz of alcohol per week. She reports that she does not use drugs.  No Known Allergies  Family History  Problem Relation Age of Onset  . Diabetes type II Mother   . Diabetes type II Father   . Diabetes type II Sister   . Colon cancer Neg Hx     Prior to Admission medications   Medication Sig Start Date End Date Taking? Authorizing Provider  ALPHAGAN P 0.1 % SOLN Place 1 drop into the right eye  2 (two) times daily. 10/10/15  Yes [provider]  amLODipine (NORVASC) 10 MG tablet Take 10 mg by mouth daily.   Yes [provider]  aspirin 81 MG tablet Take 81 mg by mouth daily.   Yes [provider]  atorvastatin (LIPITOR) 20 MG tablet take 1 tablet by mouth once daily (CHANGING FROM SIMVASTATIN) 02/12/17  Yes Eileen Stanford, PA-C  carvedilol (COREG) 25 MG tablet Take 25 mg by mouth 2 (two) times daily. 08/07/15  Yes [provider]  furosemide (LASIX) 20 MG tablet take 1 tablet by mouth once daily 09/05/16  Yes Belva Crome, MD  gabapentin (NEURONTIN) 100 MG capsule Take 100 mg by mouth 3  (three) times daily. 05/08/17  Yes [provider]  hydrALAZINE (APRESOLINE) 100 MG tablet Take 100 mg by mouth 2 (two) times daily. 10/10/15  Yes [provider]  HYDROcodone-acetaminophen (NORCO/VICODIN) 5-325 MG tablet Take 0.5 to 1 tablets by mouth every 6 hours as needed for pain. 07/26/16  Yes Pisciotta, Elmyra Ricks, PA-C  isosorbide mononitrate (IMDUR) 30 MG 24 hr tablet Take 30 mg by mouth daily.   Yes [provider]  lisinopril (PRINIVIL,ZESTRIL) 40 MG tablet Take 40 mg by mouth daily.   Yes [provider]  omeprazole (PRILOSEC) 20 MG capsule take 1 capsule by mouth once daily 08/06/16  Yes Belva Crome, MD  TRAVATAN Z 0.004 % SOLN ophthalmic solution Place 1 drop into the right eye at bedtime.  11/24/12  Yes [provider]    Physical Exam: Vitals:   06/02/17 1126 06/02/17 1721 06/02/17 2110 06/02/17 2140  BP: (!) 160/83 (!) 155/93 (!) 173/94 (!) 177/89  Pulse: 97 86 89 83  Resp: 19 16 15 16   Temp: 99.6 F (37.6 C) 98.1 F (36.7 C)  98.1 F (36.7 C)  TempSrc:  Oral  Oral  SpO2: 97% 94% 94% 93%  Weight: 59 kg (130 lb)   61.1 kg (134 lb 11.2 oz)  Height: 5\' 3"  (1.6 m)   5' (1.524 m)      Constitutional: Moderately built and nourished. Vitals:   06/02/17 1126 06/02/17 1721 06/02/17 2110 06/02/17 2140  BP: (!) 160/83 (!) 155/93 (!) 173/94 (!) 177/89  Pulse: 97 86 89 83  Resp: 19 16 15 16   Temp: 99.6 F (37.6 C) 98.1 F (36.7 C)  98.1 F (36.7 C)  TempSrc:  Oral  Oral  SpO2: 97% 94% 94% 93%  Weight: 59 kg (130 lb)   61.1 kg (134 lb 11.2 oz)  Height: 5\' 3"  (1.6 m)   5' (1.524 m)   Eyes: Anicteric no pallor.  Patient's left eye is blind.   ENMT: No discharge from the ears eyes nose or mouth. Neck: No mass palpated no neck rigidity no JVD appreciated. Respiratory: No rhonchi or crepitations. Cardiovascular: S1-S2 heard no murmurs appreciated. Abdomen: Soft nontender bowel sounds present. Musculoskeletal: No edema.  No joint  effusion. Skin: No rash.  Skin appears warm. Neurologic: Alert awake oriented to time place and person.  Moves all extremity's 5 x 5.  No facial asymmetry tongue is midline.  Blind left eye. Psychiatric: Appears normal.  Normal affect.   Labs on Admission: I have personally reviewed following labs and imaging studies  CBC: Recent Labs  Lab 06/02/17 1807  WBC 5.9  HGB 14.7  HCT 44.4  MCV 87.9  PLT 876   Basic Metabolic Panel: Recent Labs  Lab 06/02/17 1128  NA 139  K 4.1  CL 109  CO2 17*  GLUCOSE 175*  BUN 15  CREATININE 1.15*  CALCIUM 9.1   GFR: Estimated Creatinine Clearance: 30.3 mL/min (A) (by C-G formula based on SCr of 1.15 mg/dL (H)). Liver Function Tests: Recent Labs  Lab 06/02/17 1957  AST 75*  ALT 61*  ALKPHOS 85  BILITOT 0.9  PROT 8.1  ALBUMIN 3.8   No results for input(s): LIPASE, AMYLASE in the last 168 hours. No results for input(s): AMMONIA in the last 168 hours. Coagulation Profile: Recent Labs  Lab 06/02/17 1957  INR 0.91   Cardiac Enzymes: No results for input(s): CKTOTAL, CKMB, CKMBINDEX, TROPONINI in the last 168 hours. BNP (last 3 results) No results for input(s): PROBNP in the last 8760 hours. HbA1C: No results for input(s): HGBA1C in the last 72 hours. CBG: No results for input(s): GLUCAP in the last 168 hours. Lipid Profile: No results for input(s): CHOL, HDL, LDLCALC, TRIG, CHOLHDL, LDLDIRECT in the last 72 hours. Thyroid Function Tests: No results for input(s): TSH, T4TOTAL, FREET4, T3FREE, THYROIDAB in the last 72 hours. Anemia Panel: No results for input(s): VITAMINB12, FOLATE, FERRITIN, TIBC, IRON, RETICCTPCT in the last 72 hours. Urine analysis:    Component Value Date/Time   COLORURINE YELLOW 06/02/2017 2110   APPEARANCEUR HAZY (A) 06/02/2017 2110   LABSPEC 1.005 06/02/2017 2110   PHURINE 7.0 06/02/2017 2110   GLUCOSEU NEGATIVE 06/02/2017 2110   HGBUR SMALL (A) 06/02/2017 2110   BILIRUBINUR NEGATIVE 06/02/2017  2110   KETONESUR NEGATIVE 06/02/2017 2110   PROTEINUR 100 (A) 06/02/2017 2110   UROBILINOGEN 0.2 02/07/2014 2241   NITRITE POSITIVE (A) 06/02/2017 2110   LEUKOCYTESUR MODERATE (A) 06/02/2017 2110   Sepsis Labs: @LABRCNTIP (procalcitonin:4,lacticidven:4) )No results found for this or any previous visit (from the past 240 hour(s)).   Radiological Exams on Admission: Dg Chest 2 View  Result Date: 06/02/2017 CLINICAL DATA:  Chest pain. EXAM: CHEST - 2 VIEW COMPARISON:  Radiographs of November 22, 2016. FINDINGS: The heart size and mediastinal contours are within normal limits. Both lungs are clear. Atherosclerosis of thoracic aorta is noted. No pneumothorax or pleural effusion is noted. The visualized skeletal structures are unremarkable. IMPRESSION: No active cardiopulmonary disease. Aortic Atherosclerosis (ICD10-I70.0). Electronically Signed   By: Marijo Conception, M.D.   On: 06/02/2017 11:57   Mr Brain Wo Contrast  Result Date: 06/02/2017 CLINICAL DATA:  84 y/o F; ataxia, stroke suspected. History of gum cancer status post radiation. EXAM: MRI HEAD WITHOUT CONTRAST TECHNIQUE: Multiplanar, multiecho pulse sequences of the brain and surrounding structures were obtained without intravenous contrast. COMPARISON:  10/12/2009 CT head.  06/06/2009 MRI head. FINDINGS: Brain: No acute infarction, hemorrhage, hydrocephalus, extra-axial collection or mass lesion. Small chronic infarctions are present within the bilateral cerebellar hemispheres and the left hemi pons, new from 2011. Mild progression of moderate chronic microvascular ischemic changes and parenchymal volume loss of the brain for age. Vascular: Normal flow voids. Skull and upper cervical spine: Hyperostosis frontalis interna. Sinuses/Orbits: Negative.  Left globe replacement. Other: None. IMPRESSION: 1. No acute intracranial abnormality identified. 2. Mild progression of chronic microvascular ischemic changes and parenchymal volume loss of the brain  from 2011. 3. Multiple new small chronic infarctions within the bilateral cerebellar hemispheres and left hemi pons. Electronically Signed   By: Kristine Garbe M.D.   On: 06/02/2017 19:05    EKG: Independently reviewed.  Normal sinus rhythm with LVH.  Assessment/Plan Principal Problem:   Chest pain Active Problems:   Chronic diastolic heart failure ([place])  Hypertensive urgency   Ischemic stroke ([facility])   Type 2 diabetes mellitus with vascular disease ([facility])   CKD (chronic kidney disease) stage 2, GFR 60-89 ml/min    1. Chest pain with exertion shortness of breath and at times having palpitations -patient had recent Holter monitor results are not known.  We will cycle cardiac markers check d-dimer.  If d-dimer is positive will get VQ scan.  Check 2D echo.  Aspirin.  Presently chest pain-free.  Cardiology notified for consult. 2. Ataxia -MRI of the brain shows new strokes in the cerebellar and pons compared to the one in 2011.  Discussed with neurologist Dr. Malen Gauze who at this time advised no stroke work-up.  Advised to get B12 folate.  And also to follow-up with the Holter monitor results to check patient is not having A. fib.  Patient failed swallow for which we have requested speech therapist evaluation.  We will also get a carotid Doppler. 3. Hypertension uncontrolled -since patient has failed swallow I have kept patient on PRN IV hydralazine.  Also placed patient on scheduled metoprolol since patient is on Coreg.  Change to p.o. medications once patient is able to swallow. 4. Mildly elevated LFTs.  Likely from alcoholism.  Denies any abdominal pain or nausea vomiting.  Abdomen appears benign.  Follow LFTs and lipase.  If there is any further worsening may consider further radiological imaging. 5. Possible UTI on ceftriaxone follow urine culture. 6. Chronic kidney disease stage III -creatinine appears to be at baseline. 7. Chronic diastolic CHF last EF measured in 2017 was 55 to 60%.   Presently appears compensated.  Patient takes Lasix. 8. Diabetes mellitus type 2 -do not see any medications for diabetes and a medication list.  Check hemoglobin A1c.  Presently place patient on sliding scale coverage. 9. History of alcohol abuse per the chart.  Patient states she only drinks mostly on the weekends.  Patient is placed on CIWA. 10. History of gum cancer status post radiation therapy. 11. Hyperlipidemia on statins.  Note that patient's LFTs are mildly elevated.   DVT prophylaxis: Lovenox. Code Status: Full code. Family Communication: Discussed with patient. Disposition Plan: Home. Consults called: Cardiology notified.  Speech therapist.  Physical therapy. Admission status: Observation.   Rise Patience MD [facility] Pager [phone].  If 7PM-7AM, please contact night-coverage [email or web address] Password [facility]  06/02/2017, 10:27 PM

## 2017-06-02 NOTE — ED Provider Notes (Signed)
[facility] EMERGENCY DEPARTMENT Provider Note   CSN: [id number] Arrival date & time: 06/02/17  1124     History   Chief Complaint Chief Complaint  Patient presents with  . Chest Pain    HPI [name] is a 84 y.o. female.  HPI  The patient is a 84 year old female, she has a known history of high cholesterol, high blood pressure, diabetes and has a history of tongue cancer.  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  She has been having chest pain for approximately 1 month, she states it is actually been longer than a month but she cannot pinpoint the exact start.  Review of the medical record shows that the patient saw her cardiologist on the first week of May, she has a known history of paroxysmal atrial fibrillation and had been placed on a Holter monitor, the results of that are not known at this time.  She reports that she continues to have this chest pain which seems to be diffuse across the chest and associated with neck pain, the neck pain has become worse over the last month and seems to be worse especially with touching or moving the head.  She denies numbness or weakness of the arms or the legs but states that she has had a progressive ataxia when she tries to stand up and feels like she is constantly off balance.  She does report that she has a problem with drinking alcohol.  She denies shortness of breath or fevers but does have a chronic cough productive of a mild amount of white phlegm.  Nothing seems to make this chest pain better or worse, it is been constant though may get worse when she is in the upright position and better when she is laying down.  Past Medical History:  Diagnosis Date  . Anemia 04/03/2011  . Anginal pain ([place])   . Blind left eye 1999  . Depression   . Exertional dyspnea   . GERD (gastroesophageal reflux disease)   . High cholesterol   . Hip fracture ([place]) 07/17/11   fall from 1-2 feet; left  .  Hypertension   . Prosthetic eye globe 1999   "from diabetic; left eye"  . Tongue cancer ([place]) 1998   S/P radiation  . Type II diabetes mellitus ([place])    not taking any medication    Patient Active Problem List   Diagnosis Date Noted  . Closed non-physeal fracture of metatarsal bone of right foot 07/31/2016  . CAP (community acquired pneumonia)   . Hip swelling 01/17/2015  . Chest tightness 02/17/2014  . Hypertensive urgency 02/17/2014  . Chronic diastolic heart failure ([place]) 03/15/2013  . Bacteremia 07/22/2011  . UTI (lower urinary tract infection) 07/21/2011  . Closed left hip fracture ([place]) 07/17/2011  . HTN (hypertension) 07/17/2011  . DM2 (diabetes mellitus, type 2) ([place]) 07/17/2011  . Hyperlipidemia 07/17/2011  . Anemia 04/03/2011    Past Surgical History:  Procedure Laterality Date  . HIP ARTHROPLASTY  07/18/2011   Procedure: ARTHROPLASTY BIPOLAR HIP;  Surgeon: Nita Sells, MD;  Location: [facility];  Service: Orthopedics;  Laterality: Left;  . INTRAOCULAR PROSTHESES INSERTION  1999   left  . TONGUE BIOPSY  1998   "cancer"  . TUBAL LIGATION  1970's     OB History   None      Home Medications    Prior to Admission medications   Medication Sig Start Date  End Date Taking? Authorizing Provider  ALPHAGAN P 0.1 % SOLN Place 1 drop into the right eye 2 (two) times daily. 10/10/15  Yes [provider]  amLODipine (NORVASC) 10 MG tablet Take 10 mg by mouth daily.   Yes [provider]  aspirin 81 MG tablet Take 81 mg by mouth daily.   Yes [provider]  atorvastatin (LIPITOR) 20 MG tablet take 1 tablet by mouth once daily (CHANGING FROM SIMVASTATIN) 02/12/17  Yes Eileen Stanford, PA-C  carvedilol (COREG) 25 MG tablet Take 25 mg by mouth 2 (two) times daily. 08/07/15  Yes [provider]  furosemide (LASIX) 20 MG tablet take 1 tablet by mouth once daily 09/05/16  Yes Belva Crome, MD  gabapentin (NEURONTIN) 100 MG capsule  Take 100 mg by mouth 3 (three) times daily. 05/08/17  Yes [provider]  hydrALAZINE (APRESOLINE) 100 MG tablet Take 100 mg by mouth 2 (two) times daily. 10/10/15  Yes [provider]  HYDROcodone-acetaminophen (NORCO/VICODIN) 5-325 MG tablet Take 0.5 to 1 tablets by mouth every 6 hours as needed for pain. 07/26/16  Yes Pisciotta, Elmyra Ricks, PA-C  isosorbide mononitrate (IMDUR) 30 MG 24 hr tablet Take 30 mg by mouth daily.   Yes [provider]  lisinopril (PRINIVIL,ZESTRIL) 40 MG tablet Take 40 mg by mouth daily.   Yes [provider]  omeprazole (PRILOSEC) 20 MG capsule take 1 capsule by mouth once daily 08/06/16  Yes Belva Crome, MD  TRAVATAN Z 0.004 % SOLN ophthalmic solution Place 1 drop into the right eye at bedtime.  11/24/12  Yes [provider]    Family History Family History  Problem Relation Age of Onset  . Diabetes type II Mother   . Diabetes type II Father   . Diabetes type II Sister   . Colon cancer Neg Hx     Social History Social History   Tobacco Use  . Smoking status: Former Smoker    Packs/day: 1.50    Years: 47.00    Pack years: 70.50    Types: Cigarettes    Last attempt to quit: 09/13/1997    Years since quitting: 19.7  . Smokeless tobacco: Never Used  Substance Use Topics  . Alcohol use: Yes    Alcohol/week: 10.2 oz    Types: 17 Shots of liquor per week    Comment: 07/17/11 "1/5 rum q weekend"  . Drug use: No    Types: Marijuana, Heroin    Comment: 07/17/11 "last drug use ~ 20 yr ago"     Allergies   Patient has no known allergies.   Review of Systems Review of Systems  All other systems reviewed and are negative.    Physical Exam Updated Vital Signs BP (!) 155/93 (BP Location: Left Arm)   Pulse 86   Temp 98.1 F (36.7 C) (Oral)   Resp 16   Ht 5\' 3"  (1.6 m)   Wt 59 kg (130 lb)   SpO2 94%   BMI 23.03 kg/m   Physical Exam  Constitutional: She appears well-developed and well-nourished. No  distress.  HENT:  Head: Normocephalic and atraumatic.  Mouth/Throat: Oropharynx is clear and moist. No oropharyngeal exudate.  Left eye is prosthetic, right eye seems to appear normal, normal pupillary exam  Eyes: Conjunctivae and EOM are normal. Right eye exhibits no discharge. Left eye exhibits no discharge. No scleral icterus.  Neck: Normal range of motion. Neck supple. No JVD present. No thyromegaly present.  Cardiovascular: Normal  rate, regular rhythm, normal heart sounds and intact distal pulses. Exam reveals no gallop and no friction rub.  No murmur heard. Pulmonary/Chest: Effort normal and breath sounds normal. No respiratory distress. She has no wheezes. She has no rales.  Abdominal: Soft. Bowel sounds are normal. She exhibits no distension and no mass. There is no tenderness.  Musculoskeletal: Normal range of motion. She exhibits no edema or tenderness.  Lymphadenopathy:    She has no cervical adenopathy.  Neurological: She is alert. Coordination normal.  The patient is alert, she is awake, she has baseline vision in her right eye grossly.  She has no facial droop.  She has a raspy voice which is also her baseline status post throat cancer.  She is able to use all 4 extremities with normal strength however when she gets up to walk she is slightly ataxic and reaches for the wall to grab onto.  Skin: Skin is warm and dry. No rash noted. No erythema.  Psychiatric: She has a normal mood and affect. Her behavior is normal.  Nursing note and vitals reviewed.    ED Treatments / Results  Labs (all labs ordered are listed, but only abnormal results are displayed) Labs Reviewed  BASIC METABOLIC PANEL - Abnormal; Notable for the following components:      Result Value   CO2 17 (*)    Glucose, Bld 175 (*)    Creatinine, Ser 1.15 (*)    GFR calc non Af Amer 43 (*)    GFR calc Af Amer 50 (*)    All other components within normal limits  CBC  ETHANOL  HEPATIC FUNCTION PANEL  RAPID  URINE DRUG SCREEN, HOSP PERFORMED  URINALYSIS, ROUTINE W REFLEX MICROSCOPIC  PROTIME-INR  I-STAT TROPONIN, ED    EKG EKG Interpretation  Date/Time:  Tuesday Jun 02 2017 11:22:10 EDT Ventricular Rate:  87 PR Interval:  158 QRS Duration: 86 QT Interval:  370 QTC Calculation: 445 R Axis:   -7 Text Interpretation:  Sinus rhythm with occasional Premature ventricular complexes Moderate voltage criteria for LVH, may be normal variant Nonspecific T wave abnormality Abnormal ECG since last tracing no significant change Confirmed by Noemi Chapel [phone]) on 06/02/2017 5:26:25 PM   Radiology Dg Chest 2 View  Result Date: 06/02/2017 CLINICAL DATA:  Chest pain. EXAM: CHEST - 2 VIEW COMPARISON:  Radiographs of November 22, 2016. FINDINGS: The heart size and mediastinal contours are within normal limits. Both lungs are clear. Atherosclerosis of thoracic aorta is noted. No pneumothorax or pleural effusion is noted. The visualized skeletal structures are unremarkable. IMPRESSION: No active cardiopulmonary disease. Aortic Atherosclerosis (ICD10-I70.0). Electronically Signed   By: Marijo Conception, M.D.   On: 06/02/2017 11:57   Mr Brain Wo Contrast  Result Date: 06/02/2017 CLINICAL DATA:  84 y/o F; ataxia, stroke suspected. History of gum cancer status post radiation. EXAM: MRI HEAD WITHOUT CONTRAST TECHNIQUE: Multiplanar, multiecho pulse sequences of the brain and surrounding structures were obtained without intravenous contrast. COMPARISON:  10/12/2009 CT head.  06/06/2009 MRI head. FINDINGS: Brain: No acute infarction, hemorrhage, hydrocephalus, extra-axial collection or mass lesion. Small chronic infarctions are present within the bilateral cerebellar hemispheres and the left hemi pons, new from 2011. Mild progression of moderate chronic microvascular ischemic changes and parenchymal volume loss of the brain for age. Vascular: Normal flow voids. Skull and upper cervical spine: Hyperostosis frontalis  interna. Sinuses/Orbits: Negative.  Left globe replacement. Other: None. IMPRESSION: 1. No acute intracranial abnormality identified. 2. Mild progression of  chronic microvascular ischemic changes and parenchymal volume loss of the brain from 2011. 3. Multiple new small chronic infarctions within the bilateral cerebellar hemispheres and left hemi pons. Electronically Signed   By: Kristine Garbe M.D.   On: 06/02/2017 19:05    Procedures Procedures (including critical care time)  Medications Ordered in ED Medications - No data to display   Initial Impression / Assessment and Plan / ED Course  I have reviewed the triage vital signs and the nursing notes.  Pertinent labs & imaging results that were available during my care of the patient were reviewed by me and considered in my medical decision making (see chart for details).     I have reviewed the x-ray, there is no acute findings to suggest pneumothorax or pneumonia.  The EKG is completely unchanged from prior and while it does so left ventricular hypertrophy, there is no signs of acute ischemia  I have reviewed the lab work and there is no signs of elevated troponin, her metabolic panel was unremarkable with a creatinine of 1.15.  The patient does have this ataxic complaint which probably needs a work-up at this time.  Will obtain an MR to make sure there is no signs of ischemia as she has significant risk for cerebrovascular disease.  She can follow-up with her cardiologist at her appointment next week as I do not think that her constant pain with a negative troponin is related to her heart at this time.   The patient's chest pain work-up has been unremarkable with a normal blood count, normal troponin, metabolic panel which is unremarkable, however the MRI of her brain shows that she has had multiple cerebellar infarcts.  I had a discussion with the patient regarding when she started having ataxia and she says it was sometime  in the last month or 2 but cannot recall when.  She has no history of atrial fibrillation, her medication list was reviewed and shows that she is taking amlodipine, baby aspirin, Lasix, multiple blood pressure medications including hydralazine, lisinopril and Imdur.  We will discuss with the admitting team regarding the need for the stroke work-up as the patient does not really have an outpatient doctor, she states she has been going to the [facility] but not seeing a regular physician.  I am concerned about adequate follow-up.  Discussed with hospitalist, they will admit  Final Clinical Impressions(s) / ED Diagnoses   Final diagnoses:  Ischemic stroke [facility])    ED Discharge Orders    None       Noemi Chapel, MD 06/02/17 [phone]

## 2017-06-02 NOTE — ED Notes (Signed)
Pt voice is hoarse.  Pt reports this is "new" onset 1-2 months ago and pt has been having problems swallowing food.  Has to take water after every bite before she can swallow the food.  Pt reports she is coughing more.

## 2017-06-02 NOTE — ED Triage Notes (Signed)
Per EMS- pt states chest pain that is constant for 1 month across entire chest, it is worse with deep breaths. Radiates to neck. Pain is 8/10. Pt also endorses a cough with white mucous. Denies N/V/D, or urinary symptoms.

## 2017-06-02 NOTE — Progress Notes (Signed)
Received from ED via stretcher, gait unsteady, difficulty swallowing.  Education begun on safety precautions, meds, plan of care, NPO.  Tele applied, confirmed to CCMD.

## 2017-06-03 ENCOUNTER — Encounter (HOSPITAL_COMMUNITY): Payer: Medicare Other

## 2017-06-03 ENCOUNTER — Observation Stay (HOSPITAL_COMMUNITY): Payer: Medicare Other

## 2017-06-03 ENCOUNTER — Observation Stay (HOSPITAL_BASED_OUTPATIENT_CLINIC_OR_DEPARTMENT_OTHER): Payer: Medicare Other

## 2017-06-03 DIAGNOSIS — E1159 Type 2 diabetes mellitus with other circulatory complications: Secondary | ICD-10-CM

## 2017-06-03 DIAGNOSIS — I639 Cerebral infarction, unspecified: Secondary | ICD-10-CM | POA: Diagnosis not present

## 2017-06-03 DIAGNOSIS — N182 Chronic kidney disease, stage 2 (mild): Secondary | ICD-10-CM

## 2017-06-03 DIAGNOSIS — F1010 Alcohol abuse, uncomplicated: Secondary | ICD-10-CM | POA: Diagnosis not present

## 2017-06-03 DIAGNOSIS — I361 Nonrheumatic tricuspid (valve) insufficiency: Secondary | ICD-10-CM | POA: Diagnosis not present

## 2017-06-03 DIAGNOSIS — R079 Chest pain, unspecified: Secondary | ICD-10-CM | POA: Diagnosis not present

## 2017-06-03 DIAGNOSIS — N179 Acute kidney failure, unspecified: Secondary | ICD-10-CM | POA: Diagnosis not present

## 2017-06-03 DIAGNOSIS — I200 Unstable angina: Secondary | ICD-10-CM

## 2017-06-03 DIAGNOSIS — E876 Hypokalemia: Secondary | ICD-10-CM | POA: Diagnosis not present

## 2017-06-03 DIAGNOSIS — I208 Other forms of angina pectoris: Secondary | ICD-10-CM | POA: Diagnosis not present

## 2017-06-03 LAB — LIPID PANEL
CHOLESTEROL: 154 mg/dL (ref 0–200)
HDL: 56 mg/dL (ref >40)
LDL Cholesterol: 76 mg/dL (ref 0–99)
TRIGLYCERIDES: 111 mg/dL (ref <150)
Total CHOL/HDL Ratio: 2.8 RATIO
VLDL: 22 mg/dL (ref 0–40)

## 2017-06-03 LAB — COMPREHENSIVE METABOLIC PANEL
ALBUMIN: 3.2 g/dL — AB (ref 3.5–5.0)
ALK PHOS: 70 U/L (ref 38–126)
ALT: 52 U/L (ref 14–54)
ANION GAP: 11 (ref 5–15)
AST: 63 U/L — ABNORMAL HIGH (ref 15–41)
BUN: 9 mg/dL (ref 6–20)
CALCIUM: 9.2 mg/dL (ref 8.9–10.3)
CO2: 25 mmol/L (ref 22–32)
Chloride: 108 mmol/L (ref 101–111)
Creatinine, Ser: 0.91 mg/dL (ref 0.44–1.00)
GFR calc non Af Amer: 57 mL/min — ABNORMAL LOW (ref >60)
GLUCOSE: 84 mg/dL (ref 65–99)
POTASSIUM: 3.4 mmol/L — AB (ref 3.5–5.1)
SODIUM: 144 mmol/L (ref 135–145)
Total Bilirubin: 0.7 mg/dL (ref 0.3–1.2)
Total Protein: 7.2 g/dL (ref 6.5–8.1)

## 2017-06-03 LAB — ECHOCARDIOGRAM COMPLETE
HEIGHTINCHES: 60 in
WEIGHTICAEL: 2155.22 [oz_av]

## 2017-06-03 LAB — CBC
HEMATOCRIT: 41.3 % (ref 36.0–46.0)
HEMOGLOBIN: 13.7 g/dL (ref 12.0–15.0)
MCH: 29.1 pg (ref 26.0–34.0)
MCHC: 33.2 g/dL (ref 30.0–36.0)
MCV: 87.7 fL (ref 78.0–100.0)
Platelets: 233 10*3/uL (ref 150–400)
RBC: 4.71 MIL/uL (ref 3.87–5.11)
RDW: 14.3 % (ref 11.5–15.5)
WBC: 5.1 10*3/uL (ref 4.0–10.5)

## 2017-06-03 LAB — LIPASE, BLOOD: Lipase: 36 U/L (ref 11–51)

## 2017-06-03 LAB — GLUCOSE, CAPILLARY
GLUCOSE-CAPILLARY: 101 mg/dL — AB (ref 65–99)
Glucose-Capillary: 99 mg/dL (ref 65–99)
Glucose-Capillary: 148 mg/dL — ABNORMAL HIGH (ref 65–99)
Glucose-Capillary: 99 mg/dL (ref 65–99)

## 2017-06-03 LAB — VITAMIN B12: Vitamin B-12: 612 pg/mL (ref 180–914)

## 2017-06-03 LAB — HEMOGLOBIN A1C
Hgb A1c MFr Bld: 5.7 % — ABNORMAL HIGH (ref 4.8–5.6)
MEAN PLASMA GLUCOSE: 116.89 mg/dL

## 2017-06-03 LAB — TROPONIN I: Troponin I: <0.03 ng/mL (ref <0.03)

## 2017-06-03 MED ORDER — THIAMINE HCL 100 MG/ML IJ SOLN
100.0000 mg | Freq: Every day | INTRAMUSCULAR | Status: DC
  Filled 2017-06-03: qty 2

## 2017-06-03 MED ORDER — HYDRALAZINE HCL 50 MG PO TABS
50.0000 mg | ORAL_TABLET | Freq: Two times a day (BID) | ORAL | Status: DC
  Administered 2017-06-03: 50 mg via ORAL
  Filled 2017-06-03 (×2): qty 1

## 2017-06-03 MED ORDER — FOLIC ACID 1 MG PO TABS
1.0000 mg | ORAL_TABLET | Freq: Every day | ORAL | Status: DC
  Administered 2017-06-03 – 2017-06-05 (×3): 1 mg via ORAL
  Filled 2017-06-03 (×3): qty 1

## 2017-06-03 MED ORDER — LORAZEPAM 2 MG/ML IJ SOLN: 1.0000 mg | Freq: Four times a day (QID) | INTRAMUSCULAR | Status: DC | PRN

## 2017-06-03 MED ORDER — TECHNETIUM TO 99M ALBUMIN AGGREGATED
4.3000 | Freq: Once | INTRAVENOUS | Status: AC | PRN
  Administered 2017-06-03: 4.3 via INTRAVENOUS

## 2017-06-03 MED ORDER — POTASSIUM CHLORIDE CRYS ER 20 MEQ PO TBCR
40.0000 meq | EXTENDED_RELEASE_TABLET | Freq: Once | ORAL | Status: AC
  Administered 2017-06-03: 40 meq via ORAL
  Filled 2017-06-03: qty 2

## 2017-06-03 MED ORDER — TECHNETIUM TC 99M DIETHYLENETRIAME-PENTAACETIC ACID
32.6000 | Freq: Once | INTRAVENOUS | Status: AC | PRN
  Administered 2017-06-03: 32.6 via RESPIRATORY_TRACT

## 2017-06-03 MED ORDER — VITAMIN B-1 100 MG PO TABS
100.0000 mg | ORAL_TABLET | Freq: Every day | ORAL | Status: DC
  Administered 2017-06-03 – 2017-06-05 (×3): 100 mg via ORAL
  Filled 2017-06-03 (×3): qty 1

## 2017-06-03 MED ORDER — LORAZEPAM 1 MG PO TABS: 1.0000 mg | ORAL_TABLET | Freq: Four times a day (QID) | ORAL | Status: DC | PRN

## 2017-06-03 MED ORDER — PANTOPRAZOLE SODIUM 40 MG PO TBEC
80.0000 mg | DELAYED_RELEASE_TABLET | Freq: Every day | ORAL | Status: DC
  Administered 2017-06-03 – 2017-06-05 (×3): 80 mg via ORAL
  Filled 2017-06-03 (×3): qty 2

## 2017-06-03 MED ORDER — ADULT MULTIVITAMIN W/MINERALS CH
1.0000 | ORAL_TABLET | Freq: Every day | ORAL | Status: DC
  Administered 2017-06-03 – 2017-06-05 (×3): 1 via ORAL
  Filled 2017-06-03 (×3): qty 1

## 2017-06-03 MED ORDER — SODIUM CHLORIDE 0.9 % IV SOLN
1.0000 g | Freq: Every day | INTRAVENOUS | Status: DC
  Administered 2017-06-03 – 2017-06-05 (×3): 1 g via INTRAVENOUS
  Filled 2017-06-03 (×4): qty 10

## 2017-06-03 NOTE — Progress Notes (Signed)
Progress Note    [name]  BDZ:[id number] DOB: Jul 23, 1932  DOA: 06/02/2017 PCP: Dierdre Harness, FNP    Brief Narrative:    Medical records reviewed and are as summarized below:  [name] is an 84 y.o. female  with history of diastolic CHF, diabetes mellitus, hypertension, chronic kidney disease stage III, history of lung cancer status post radiation, history of alcohol abuse presents to the ER with complaint of chest pain.  Patient states she has been having chest pain over the last 1 month with shortness of breath symptoms are mostly on exertion at times also happens on at rest.  Patient also over the last 2 to 3 months has been having difficulty walking with balance issues.  Denies any weakness of the upper or lower extremity or loss of function.  Denies any visual symptoms difficulty speaking.  Patient did complain to the nurse that time she gets shocklike feeling when she eats.  Presently chest pain-free.  Patient had followed up with cardiologist Dr. Tamala Julian 3 weeks ago and at that time patient had complained of chest pain and palpitations.  Patient had a Holter monitor done results of which are not known.  Holter monitor was done to rule out paroxysmal atrial fibrillation.  Patient at that time was noticed to be not taking her blood pressure medications.    Assessment/Plan:   Principal Problem:   Chest pain Active Problems:   Chronic diastolic heart failure (HCC)   Hypertensive urgency   Ischemic stroke (HCC)   Type 2 diabetes mellitus with vascular disease (HCC)   CKD (chronic kidney disease) stage 2, GFR 60-89 ml/min   Chest pain with exertion shortness of breath -recent Holter monitor results are not known -V/Q scan low risk -CE negative thus far -cards apparently consulted: stress test in AM  Ataxia  -MRI of the brain shows new strokes in the cerebellar and pons compared to the one in 2011.  admiting MD Discussed with neurologist Dr. Malen Gauze who at  this time advised no stroke work-up.   -B12 normal  Hypertension uncontrolled -resume home meds -?if patient actually takes them  Mildly elevated LFTs.   -Likely from alcoholism.   -monitor  Possible UTI on ceftriaxone follow urine culture.  Chronic kidney disease stage III  -creatinine appears to be at baseline.  Chronic diastolic CHF last EF measured in 2017 was 55 to 60%.  Presently appears compensated.   Diabetes mellitus type 2  - Check hemoglobin A1c -SSI  Alcohol use -CIWA.  GERD -on PPI  History of mouth cancer status post radiation therapy.  Hyperlipidemia  -on statin -watch LFTs    Body mass index is 26.31 kg/m.   Family Communication/Anticipated D/C date and plan/Code Status   DVT prophylaxis: Lovenox ordered. Code Status: Full Code.  Family Communication: at bedside Disposition Plan: home in the AM   Medical Consultants:   cards  Subjective:   No current chest pain  Objective:    Vitals:   06/03/17 0100 06/03/17 0300 06/03/17 1125 06/03/17 1548  BP: (!) 184/85 (!) 177/84 (!) 181/81 100/64  Pulse: 62 62 64 60  Resp: 17 16 18 18   Temp: 98.4 F (36.9 C) 98.6 F (37 C) 97.6 F (36.4 C) 98.1 F (36.7 C)  TempSrc: Oral Oral Oral Oral  SpO2: 94%  97% 96%  Weight:      Height:        Intake/Output Summary (Last 24 hours) at 06/03/2017 1553 Last data filed at  06/03/2017 1300 Gross per 24 hour  Intake 360 ml  Output 660 ml  Net -300 ml   Filed Weights   06/02/17 1126 06/02/17 2140  Weight: 59 kg (130 lb) 61.1 kg (134 lb 11.2 oz)    Exam: Speech muffled NAD No wheezing, no increased work of breathing No LE edema No rashes  Data Reviewed:   I have personally reviewed following labs and imaging studies:  Labs: Labs show the following:   Basic Metabolic Panel: Recent Labs  Lab 06/02/17 1128 06/03/17 0505  NA 139 144  K 4.1 3.4*  CL 109 108  CO2 17* 25  GLUCOSE 175* 84  BUN 15 9  CREATININE 1.15* 0.91  CALCIUM  9.1 9.2   GFR Estimated Creatinine Clearance: 38.2 mL/min (by C-G formula based on SCr of 0.91 mg/dL). Liver Function Tests: Recent Labs  Lab 06/02/17 1957 06/03/17 0505  AST 75* 63*  ALT 61* 52  ALKPHOS 85 70  BILITOT 0.9 0.7  PROT 8.1 7.2  ALBUMIN 3.8 3.2*   Recent Labs  Lab 06/03/17 0822  LIPASE 36   No results for input(s): AMMONIA in the last 168 hours. Coagulation profile Recent Labs  Lab 06/02/17 1957  INR 0.91    CBC: Recent Labs  Lab 06/02/17 1807 06/03/17 0505  WBC 5.9 5.1  HGB 14.7 13.7  HCT 44.4 41.3  MCV 87.9 87.7  PLT 254 233   Cardiac Enzymes: Recent Labs  Lab 06/02/17 2326 06/03/17 0505 06/03/17 1140  TROPONINI <0.03 <0.03 <0.03   BNP (last 3 results) No results for input(s): PROBNP in the last 8760 hours. CBG: Recent Labs  Lab 06/03/17 0910 06/03/17 1114  GLUCAP 101* 99   D-Dimer: Recent Labs    06/02/17 2326  DDIMER 2.11*   Hgb A1c: Recent Labs    06/03/17 0505  HGBA1C 5.7*   Lipid Profile: Recent Labs    06/03/17 0505  CHOL 154  HDL 56  LDLCALC 76  TRIG 111  CHOLHDL 2.8   Thyroid function studies: No results for input(s): TSH, T4TOTAL, T3FREE, THYROIDAB in the last 72 hours.  Invalid input(s): FREET3 Anemia work up: Recent Labs    06/03/17 0822  VITAMINB12 612   Sepsis Labs: Recent Labs  Lab 06/02/17 1807 06/03/17 0505  WBC 5.9 5.1    Microbiology No results found for this or any previous visit (from the past 240 hour(s)).  Procedures and diagnostic studies:  Dg Chest 2 View  Result Date: 06/02/2017 CLINICAL DATA:  Chest pain. EXAM: CHEST - 2 VIEW COMPARISON:  Radiographs of November 22, 2016. FINDINGS: The heart size and mediastinal contours are within normal limits. Both lungs are clear. Atherosclerosis of thoracic aorta is noted. No pneumothorax or pleural effusion is noted. The visualized skeletal structures are unremarkable. IMPRESSION: No active cardiopulmonary disease. Aortic  Atherosclerosis (ICD10-I70.0). Electronically Signed   By: Marijo Conception, M.D.   On: 06/02/2017 11:57   Mr Brain Wo Contrast  Result Date: 06/02/2017 CLINICAL DATA:  84 y/o F; ataxia, stroke suspected. History of gum cancer status post radiation. EXAM: MRI HEAD WITHOUT CONTRAST TECHNIQUE: Multiplanar, multiecho pulse sequences of the brain and surrounding structures were obtained without intravenous contrast. COMPARISON:  10/12/2009 CT head.  06/06/2009 MRI head. FINDINGS: Brain: No acute infarction, hemorrhage, hydrocephalus, extra-axial collection or mass lesion. Small chronic infarctions are present within the bilateral cerebellar hemispheres and the left hemi pons, new from 2011. Mild progression of moderate chronic microvascular ischemic changes and parenchymal volume loss  of the brain for age. Vascular: Normal flow voids. Skull and upper cervical spine: Hyperostosis frontalis interna. Sinuses/Orbits: Negative.  Left globe replacement. Other: None. IMPRESSION: 1. No acute intracranial abnormality identified. 2. Mild progression of chronic microvascular ischemic changes and parenchymal volume loss of the brain from 2011. 3. Multiple new small chronic infarctions within the bilateral cerebellar hemispheres and left hemi pons. Electronically Signed   By: Kristine Garbe M.D.   On: 06/02/2017 19:05   Nm Pulmonary Perf And Vent  Result Date: 06/03/2017 CLINICAL DATA:  Shortness of breath and chest pain EXAM: NUCLEAR MEDICINE VENTILATION - PERFUSION LUNG SCAN VIEWS: Anterior, posterior, left lateral, right lateral, RPO, LPO, RAO, LAO-ventilation and perfusion RADIOPHARMACEUTICALS:  32.6 mCi of Tc-66m DTPA aerosol inhalation and 4.3 mCi Tc58m-MAA IV COMPARISON:  Chest radiograph Jun 02, 2017 FINDINGS: Ventilation: Radiotracer uptake on the ventilation study is homogeneous and symmetric bilaterally. No ventilation defects are evident. Perfusion: Radiotracer uptake on the perfusion study is  homogeneous and symmetric bilaterally. No perfusion defects are evident. IMPRESSION: No appreciable ventilation or perfusion defects. This study constitutes a very low probability of pulmonary embolus. Electronically Signed   By: Lowella Grip III M.D.   On: 06/03/2017 12:11    Medications:   . amLODipine  10 mg Oral Daily  . aspirin  300 mg Rectal Daily   Or  . aspirin  325 mg Oral Daily  . atorvastatin  20 mg Oral q1800  . brimonidine  1 drop Right Eye BID  . enoxaparin (LOVENOX) injection  40 mg Subcutaneous Daily  . folic acid  1 mg Oral Daily  . gabapentin  100 mg Oral TID  . hydrALAZINE  100 mg Oral BID  . insulin aspart  0-9 Units Subcutaneous TID WC  . isosorbide mononitrate  30 mg Oral Daily  . latanoprost  1 drop Right Eye QHS  . lisinopril  40 mg Oral Daily  . metoprolol tartrate  5 mg Intravenous Q8H  . multivitamin with minerals  1 tablet Oral Daily  . pantoprazole  80 mg Oral Daily  . thiamine  100 mg Oral Daily   Or  . thiamine  100 mg Intravenous Daily   Continuous Infusions: . cefTRIAXone (ROCEPHIN)  IV Stopped (06/03/17 1219)     LOS: 0 days   Geradine Girt  [facility]   *Please refer to [email or web address], password [email or web address] to get updated schedule on who will round on this patient, as hospitalists switch teams weekly. If 7PM-7AM, please contact night-coverage at [email or web address], password [email or web address] for any overnight needs.  06/03/2017, 3:53 PM

## 2017-06-03 NOTE — Consult Note (Addendum)
Cardiology Consultation:   Patient ID: Avaley Coop; [id number]; March 29, 1932   Admit date: 06/02/2017 Date of Consult: 06/03/2017  Primary Care Provider: Dierdre Harness, FNP Primary Cardiologist: Sinclair Grooms, MD Primary Electrophysiologist:  None   Patient Profile:   [name] is a 84 y.o. female with a PMH of chronic diastolic CHF, HTN, DM type 2, CKD stage III, history of lung cancer s/p radiation, and alcohol abuse who is being seen today for the evaluation of chest pain at the request of Dr. Eliseo Squires.  History of Present Illness:   [name] has been feeling generally unwell for the past month. She reports intermittent chest pain which radiates down from her neck and wraps under he left breast, persists for hours at a time, is exacerbated by exertion, but without associated SOB, diaphoresis, nausea, vomiting, dizziness, lightheadedness, or syncope. She states her pain has become more persistent over the past few days prompting her to present to the ED for further evaluation.  She was last evaluated by Dr. Tamala Julian 05/13/17 at which time she was complaining of palpitations, exertional fatigue, and SOB. She was noted to have an elevated BP at that visit and was reportedly not taking hydralazine, carvedilol, or isosorbide. She was recommended to undergo a 48 hour holter monitor which she has completed but has not been read yet. Her last ischemic evaluation was a NST 2016 which was a low risk study with evidence of small inferolateral scar. Her last echocardiogram was 10/2015 with EF 55-60% and G1DD.  At the time of this evaluation, she continues to have chest discomfort which she rates as a 6/10, improved from its worst at 10/10. ROS notable for palpitations, intermittent HA, dry mouth, weight loss, and gait instability. She is worried about losing her independence but is not interested in living anywhere other than her home. She denies orthopnea, PND, LE edema, weight gain, DOE.    Hospital course: Hypertensive, otherwise VSS. Labs notable for K 3.4, Cr 0.91, Hgb 13.7, PLT 233, Trop negative x3, LDL 76, A1C 5.7, Ddimer 2.11. Sinus rhythm with PVC, nonspecific T wave abnormalities, no STE/D; no significant change from previous. CXR without acute findings; noted to aortic atherosclerosis. VQ scan with low probability of PE. MRI brain with multiple new chronic infarcts within the bilateral cerebellar and pons compared to previous study 2011. Case discussed with neurology who recommended no further work-up. Patient initially failed swallow test; passed on evaluation by speech pathology. Home medication restarted, as well as previously dosed hydralazine. Echo pending. Cardiology asked to evaluate for further recommendations for chest pain.    Past Medical History:  Diagnosis Date  . Anemia 04/03/2011  . Anginal pain ([place])   . Anxiety   . Arthritis    "feet" (06/02/2017)  . Blind left eye 1999  . Depression   . Exertional dyspnea   . GERD (gastroesophageal reflux disease)   . High cholesterol   . Hip fracture ([place]) 07/17/11   fall from 1-2 feet; left  . Hypertension   . Ischemic stroke ([place]) 06/02/2017   "weak moving around" (06/02/2017)  . Prosthetic eye globe 1999   "from diabetic; left eye"  . Tongue cancer ([place]) 1998   S/P radiation  . Type II diabetes mellitus ([place])    not taking any medication (06/02/2017)    Past Surgical History:  Procedure Laterality Date  . ENUCLEATION Left   . FRACTURE SURGERY    . HIP ARTHROPLASTY  07/18/2011   Procedure: ARTHROPLASTY BIPOLAR  HIP;  Surgeon: Nita Sells, MD;  Location: [facility];  Service: Orthopedics;  Laterality: Left;  . INTRAOCULAR PROSTHESES INSERTION  1999   left  . TONGUE BIOPSY  1998   "cancer"  . TUBAL LIGATION  1970's     Home Medications:  Prior to Admission medications   Medication Sig Start Date End Date Taking? Authorizing Provider  ALPHAGAN P 0.1 % SOLN Place 1 drop into the right eye 2 (two)  times daily. 10/10/15  Yes [provider]  amLODipine (NORVASC) 10 MG tablet Take 10 mg by mouth daily.   Yes [provider]  aspirin 81 MG tablet Take 81 mg by mouth daily.   Yes [provider]  atorvastatin (LIPITOR) 20 MG tablet take 1 tablet by mouth once daily (CHANGING FROM SIMVASTATIN) 02/12/17  Yes Eileen Stanford, PA-C  carvedilol (COREG) 25 MG tablet Take 25 mg by mouth 2 (two) times daily. 08/07/15  Yes [provider]  furosemide (LASIX) 20 MG tablet take 1 tablet by mouth once daily 09/05/16  Yes Belva Crome, MD  gabapentin (NEURONTIN) 100 MG capsule Take 100 mg by mouth 3 (three) times daily. 05/08/17  Yes [provider]  hydrALAZINE (APRESOLINE) 100 MG tablet Take 100 mg by mouth 2 (two) times daily. 10/10/15  Yes [provider]  HYDROcodone-acetaminophen (NORCO/VICODIN) 5-325 MG tablet Take 0.5 to 1 tablets by mouth every 6 hours as needed for pain. 07/26/16  Yes Pisciotta, Elmyra Ricks, PA-C  isosorbide mononitrate (IMDUR) 30 MG 24 hr tablet Take 30 mg by mouth daily.   Yes [provider]  lisinopril (PRINIVIL,ZESTRIL) 40 MG tablet Take 40 mg by mouth daily.   Yes [provider]  omeprazole (PRILOSEC) 20 MG capsule take 1 capsule by mouth once daily 08/06/16  Yes Belva Crome, MD  TRAVATAN Z 0.004 % SOLN ophthalmic solution Place 1 drop into the right eye at bedtime.  11/24/12  Yes [provider]    Inpatient Medications: Scheduled Meds: . amLODipine  10 mg Oral Daily  . aspirin  300 mg Rectal Daily   Or  . aspirin  325 mg Oral Daily  . atorvastatin  20 mg Oral q1800  . brimonidine  1 drop Right Eye BID  . enoxaparin (LOVENOX) injection  40 mg Subcutaneous Daily  . folic acid  1 mg Oral Daily  . gabapentin  100 mg Oral TID  . hydrALAZINE  100 mg Oral BID  . insulin aspart  0-9 Units Subcutaneous TID WC  . isosorbide mononitrate  30 mg Oral Daily  . latanoprost  1 drop Right Eye QHS  .  lisinopril  40 mg Oral Daily  . metoprolol tartrate  5 mg Intravenous Q8H  . multivitamin with minerals  1 tablet Oral Daily  . pantoprazole  80 mg Oral Daily  . thiamine  100 mg Oral Daily   Or  . thiamine  100 mg Intravenous Daily   Continuous Infusions: . cefTRIAXone (ROCEPHIN)  IV Stopped (06/03/17 1219)   PRN Meds: acetaminophen **OR** acetaminophen (TYLENOL) oral liquid 160 mg/5 mL **OR** acetaminophen, hydrALAZINE, LORazepam **OR** LORazepam, nitroGLYCERIN  Allergies:   No Known Allergies  Social History:   Social History   Socioeconomic History  . Marital status: Single    Spouse name: Not on file  . Number of children: Not on file  . Years of education: Not on file  . Highest education level: Not on file  Occupational History  . Not on  file  Social Needs  . Financial resource strain: Not on file  . Food insecurity:    Worry: Not on file    Inability: Not on file  . Transportation needs:    Medical: Not on file    Non-medical: Not on file  Tobacco Use  . Smoking status: Former Smoker    Packs/day: 1.50    Years: 47.00    Pack years: 70.50    Types: Cigarettes    Last attempt to quit: 09/13/1997    Years since quitting: 19.7  . Smokeless tobacco: Never Used  Substance and Sexual Activity  . Alcohol use: Yes    Alcohol/week: 10.2 oz    Types: 17 Shots of liquor per week    Comment: 06/02/2017 "1/5 rum q weekend"  . Drug use: No    Types: Marijuana, Heroin    Comment: 06/02/2017  "last drug use was in the 1980s"  . Sexual activity: Not Currently  Lifestyle  . Physical activity:    Days per week: Not on file    Minutes per session: Not on file  . Stress: Not on file  Relationships  . Social connections:    Talks on phone: Not on file    Gets together: Not on file    Attends religious service: Not on file    Active member of club or organization: Not on file    Attends meetings of clubs or organizations: Not on file    Relationship status: Not on file    . Intimate partner violence:    Fear of current or ex partner: Not on file    Emotionally abused: Not on file    Physically abused: Not on file    Forced sexual activity: Not on file  Other Topics Concern  . Not on file  Social History Narrative  . Not on file    Family History:    Family History  Problem Relation Age of Onset  . Diabetes type II Mother   . Diabetes type II Father   . Diabetes type II Sister   . Colon cancer Neg Hx      ROS:  Please see the history of present illness.   All other ROS reviewed and negative.     Physical Exam/Data:   Vitals:   06/02/17 2300 06/03/17 0100 06/03/17 0300 06/03/17 1125  BP: (!) 171/77 (!) 184/85 (!) 177/84 (!) 181/81  Pulse: 89 62 62 64  Resp: 16 17 16 18   Temp: 98.2 F (36.8 C) 98.4 F (36.9 C) 98.6 F (37 C) 97.6 F (36.4 C)  TempSrc: Oral Oral Oral Oral  SpO2: 97% 94%  97%  Weight:      Height:        Intake/Output Summary (Last 24 hours) at 06/03/2017 1524 Last data filed at 06/03/2017 1300 Gross per 24 hour  Intake 360 ml  Output 660 ml  Net -300 ml   Filed Weights   06/02/17 1126 06/02/17 2140  Weight: 130 lb (59 kg) 134 lb 11.2 oz (61.1 kg)   Body mass index is 26.31 kg/m.  General: Frail elderly lady laying in bed in no acute distress HEENT: sclera anicteric; s/p L eye enucleation with glass eye currently; dry MM  Neck: no JVD Vascular: No carotid bruits; distal pulses 2+ bilaterally Cardiac:  normal S1, S2; RRR; no murmurs, gallops, or rubs appreciated Lungs:  clear to auscultation bilaterally, no wheezing, rhonchi or rales  Abd: NABS, soft, nontender, no hepatomegaly Ext: no  edema Musculoskeletal:  No deformities, BUE and BLE strength normal and equal Skin: warm and dry  Neuro:  CNs 2-12 intact, no focal abnormalities noted Psych:  Normal affect   EKG:  The EKG was personally reviewed and demonstrates:  Sinus rhythm with PVC, nonspecific T wave abnormalities, no STE/D; no significant change  from previous Telemetry:  Telemetry was personally reviewed and demonstrates:  Sinus rhythm with intermittent bradycardia  Relevant CV Studies:  Echocardiogram 10/2015: Study Conclusions  - Left ventricle: The cavity size was normal. There was moderate   focal basal hypertrophy of the septum. Systolic function was   normal. The estimated ejection fraction was in the range of 55%   to 60%. Wall motion was normal; there were no regional wall   motion abnormalities. Doppler parameters are consistent with   abnormal left ventricular relaxation (grade 1 diastolic   dysfunction). - Aortic valve: There was mild regurgitation. - Mitral valve: Calcified annulus. Mildly thickened leaflets .   There was mild regurgitation. - Left atrium: The atrium was moderately to severely dilated.   Volume/bsa, S: 52.1 ml/m^2. - Tricuspid valve: There was mild regurgitation. - Pulmonic valve: There was moderate regurgitation. - Pulmonary arteries: Systolic pressure was mildly increased. PA   peak pressure: 40 mm Hg (S).  Laboratory Data:  Chemistry Recent Labs  Lab 06/02/17 1128 06/03/17 0505  NA 139 144  K 4.1 3.4*  CL 109 108  CO2 17* 25  GLUCOSE 175* 84  BUN 15 9  CREATININE 1.15* 0.91  CALCIUM 9.1 9.2  GFRNONAA 43* 57*  GFRAA 50* >60  ANIONGAP 13 11    Recent Labs  Lab 06/02/17 1957 06/03/17 0505  PROT 8.1 7.2  ALBUMIN 3.8 3.2*  AST 75* 63*  ALT 61* 52  ALKPHOS 85 70  BILITOT 0.9 0.7   Hematology Recent Labs  Lab 06/02/17 1807 06/03/17 0505  WBC 5.9 5.1  RBC 5.05 4.71  HGB 14.7 13.7  HCT 44.4 41.3  MCV 87.9 87.7  MCH 29.1 29.1  MCHC 33.1 33.2  RDW 14.4 14.3  PLT 254 233   Cardiac Enzymes Recent Labs  Lab 06/02/17 2326 06/03/17 0505 06/03/17 1140  TROPONINI <0.03 <0.03 <0.03    Recent Labs  Lab 06/02/17 1130  TROPIPOC 0.01    BNPNo results for input(s): BNP, PROBNP in the last 168 hours.  DDimer  Recent Labs  Lab 06/02/17 2326  DDIMER 2.11*     Radiology/Studies:  Dg Chest 2 View  Result Date: 06/02/2017 CLINICAL DATA:  Chest pain. EXAM: CHEST - 2 VIEW COMPARISON:  Radiographs of November 22, 2016. FINDINGS: The heart size and mediastinal contours are within normal limits. Both lungs are clear. Atherosclerosis of thoracic aorta is noted. No pneumothorax or pleural effusion is noted. The visualized skeletal structures are unremarkable. IMPRESSION: No active cardiopulmonary disease. Aortic Atherosclerosis (ICD10-I70.0). Electronically Signed   By: Marijo Conception, M.D.   On: 06/02/2017 11:57   Mr Brain Wo Contrast  Result Date: 06/02/2017 CLINICAL DATA:  84 y/o F; ataxia, stroke suspected. History of gum cancer status post radiation. EXAM: MRI HEAD WITHOUT CONTRAST TECHNIQUE: Multiplanar, multiecho pulse sequences of the brain and surrounding structures were obtained without intravenous contrast. COMPARISON:  10/12/2009 CT head.  06/06/2009 MRI head. FINDINGS: Brain: No acute infarction, hemorrhage, hydrocephalus, extra-axial collection or mass lesion. Small chronic infarctions are present within the bilateral cerebellar hemispheres and the left hemi pons, new from 2011. Mild progression of moderate chronic microvascular ischemic changes and parenchymal  volume loss of the brain for age. Vascular: Normal flow voids. Skull and upper cervical spine: Hyperostosis frontalis interna. Sinuses/Orbits: Negative.  Left globe replacement. Other: None. IMPRESSION: 1. No acute intracranial abnormality identified. 2. Mild progression of chronic microvascular ischemic changes and parenchymal volume loss of the brain from 2011. 3. Multiple new small chronic infarctions within the bilateral cerebellar hemispheres and left hemi pons. Electronically Signed   By: Kristine Garbe M.D.   On: 06/02/2017 19:05   Nm Pulmonary Perf And Vent  Result Date: 06/03/2017 CLINICAL DATA:  Shortness of breath and chest pain EXAM: NUCLEAR MEDICINE VENTILATION -  PERFUSION LUNG SCAN VIEWS: Anterior, posterior, left lateral, right lateral, RPO, LPO, RAO, LAO-ventilation and perfusion RADIOPHARMACEUTICALS:  32.6 mCi of Tc-72m DTPA aerosol inhalation and 4.3 mCi Tc38m-MAA IV COMPARISON:  Chest radiograph Jun 02, 2017 FINDINGS: Ventilation: Radiotracer uptake on the ventilation study is homogeneous and symmetric bilaterally. No ventilation defects are evident. Perfusion: Radiotracer uptake on the perfusion study is homogeneous and symmetric bilaterally. No perfusion defects are evident. IMPRESSION: No appreciable ventilation or perfusion defects. This study constitutes a very low probability of pulmonary embolus. Electronically Signed   By: Lowella Grip III M.D.   On: 06/03/2017 12:11    Assessment and Plan:   1. Chest pain: p/w typical/atypical chest pain worse with exertion, constant in nature, but not associated with additional symptoms. Troponin negative x3. Ddimer 2.11. EKG with ischemic changes. CXR without acute findings; noted to aortic atherosclerosis. VQ scan with low probability of PE. Of note, patient was seen by Dr. Tamala Julian 05/13/17 with complaints of palpations and underwent a 48hr holter monitor; results not available. Her last ischemic evaluation was a NSR 2016 which was a low risk study with evidence of small inferolateral scar.  - Echo pending to assess LV function and wall motion - Will check NST in AM. Keep NPO after MN - Continue aspirin, statin, and imdur  2. Chronic diastolic CHF: last echo with EF 55-60%. CXR without vascular congestion. Patient appears euvolemic on exam. With intermittent bradycardia on telemetry so will hold off on restarting carvedilol.  - Echo pending - Continue lisinopril and lasix  3. HTN: Persistently hypertensive this admission; improved to 100/64 as of 3:48pm. Noted to be non-compliant with antihypertensive meds at last outpt cards visit 05/13/17. Still suspect this is playing a role. Home medications initially held  this morning due to failed swallow test; now restarted with the exception of carvedilol.  - Continue amlodipine, hydralazine, lisinopril, and lasix  4. Ischemic stroke: noted gait/balance instability over the last 2-3 months. MRI Brain with chronic strokes in the cerebellar and pons regions, new when compared to previous in 2011. Results reportedly discussed with neurology who did not recommend a stoke work up at this time. - Continue management per primary team  5. DM type 2: Hgb A1C 5.7 this admission - Continue management per primary team  6. CKD stage III: stable today at 0.91; baseline 1.1 - Continue to monitor closely  7. HLD: LDL 76; goal <70 - Continue statin  8. Hypokalemia: K 3.4 today; repleted with 40 mEq today - Continue to monitor  For questions or updates, please contact [place] Please consult [email or web address] for contact info under Cardiology/STEMI.   Signed, Abigail Butts, PA-C  06/03/2017 3:24 PM [phone]  Agree with assessment and plan by Roby Lofts PA-C  We are asked to see Ms. Norsworthy, an 84 year old African-American female patient of Dr. Thompson Caul, for chest pain.  She does  have positive cardiac risk factors.  She is had a nonischemic Myoview in 2016.  Her EF is normal.  She started having exertional chest pain a month ago which was fairly typical however yesterday the pain became somewhat atypical with pain starting in her neck and shoulders rating down her arm and upper chest which was somewhat prolonged.  Her EKG shows no acute changes.  Enzymes are negative.  Exam is benign.  Her symptoms are somewhat mixed although she does not prefer to have an invasive approach.  We will pursue pharmacologic Myoview stress testing to risk stratify her.  Lorretta Harp, M.D., [place], [facility], [place] [address] [place]  [address]  [phone] 06/03/2017 4:43 PM

## 2017-06-03 NOTE — Care Management Obs Status (Signed)
[place] NOTIFICATION   Patient Details  Name: [name] MRN: [id number] Date of Birth: 1932-03-27   Medicare Observation Status Notification Given:  Yes    Carles Collet, RN 06/03/2017, 1:37 PM

## 2017-06-03 NOTE — Evaluation (Signed)
Physical Therapy Evaluation Patient Details Name: [name] MRN: [id number] DOB: [birth year] Today's Date: 06/03/2017   History of Present Illness  Pt is an 84 y.o. female admitted 06/02/17 with c/o chest pain and SOB; also c/o 2-3 months of difficulty walking with balance issues. MRI shows new strokes in cerebellar and pons compared to imaging in 2011; neurology advised no stroke work-up. PMH includes HTN, CHF, CKD III, DM, alcohol use.    Clinical Impression  Pt presents with an overall decrease in functional mobility secondary to above. PTA, pt indep with mobility and lives alone; endorses multiple falls in the past couple months. Family not able to provide consistent report. Today, reliant on UE support and intermittent minA to maintain balance while ambulating; poor postural reactions, losing balance even with minimal challenge. Pt at significant risk for falls. Would benefit from short-term SNF-level therapies to maximize functional mobility and independence prior to return home; pt in agreement with this and very motivated to maintain independence. Will follow acutely to address established goals.     Follow Up Recommendations SNF;Supervision for mobility/OOB    Equipment Recommendations  (TDB)    Recommendations for Other Services OT consult     Precautions / Restrictions Precautions Precautions: Fall Restrictions Weight Bearing Restrictions: No      Mobility  Bed Mobility Overal bed mobility: Needs Assistance Bed Mobility: Supine to Sit     Supine to sit: Supervision        Transfers Overall transfer level: Needs assistance Equipment used: 1 person hand held assist Transfers: Sit to/from Stand Sit to Stand: Min guard         General transfer comment: Relient on HHA to maintain balance upon standing  Ambulation/Gait Ambulation/Gait assistance: Min guard Ambulation Distance (Feet): (hallway) Assistive device: Rolling walker (2 wheeled);1 person hand  held assist Gait Pattern/deviations: Step-through pattern;Decreased stride length;Drifts right/left Gait velocity: Decreased Gait velocity interpretation: <1.31 ft/sec, indicative of household ambulator General Gait Details: Amb with RW and min guard for balance; amb additional distance with no DME, but pt requiring HHA and intermittent minA to maintain balance  Stairs            Wheelchair Mobility    Modified Rankin (Stroke Patients Only)       Balance Overall balance assessment: Needs assistance   Sitting balance-Leahy Scale: Good       Standing balance-Leahy Scale: Fair Standing balance comment: Can static stand with no UE support, cannot accept challenge; dynamic stability improved with BUE support                             Pertinent Vitals/Pain Pain Assessment: Faces Faces Pain Scale: Hurts a little bit Pain Location: Chest Pain Descriptors / Indicators: Sore Pain Intervention(s): Monitored during session    Home Living Family/patient expects to be discharged to:: Private residence Living Arrangements: Alone Available Help at Discharge: Family;Available PRN/intermittently(granddaughter) Type of Home: House Home Access: Stairs to enter Entrance Stairs-Rails: None Entrance Stairs-Number of Steps: 2 Home Layout: One level Home Equipment: Cane - single point;Tub bench;Bedside commode Additional Comments: Pt reports granddaughter not able to provide consistent assist    Prior Function Level of Independence: Independent         Comments: Pt reports indep with ambulation and ADLs; does not drive, uses bus for transportation. Endorses multiple falls in the past few months.     Hand Dominance        Extremity/Trunk Assessment  Upper Extremity Assessment Upper Extremity Assessment: Generalized weakness    Lower Extremity Assessment Lower Extremity Assessment: Generalized weakness    Cervical / Trunk Assessment Cervical / Trunk  Assessment: Kyphotic  Communication   Communication: No difficulties  Cognition Arousal/Alertness: Awake/alert Behavior During Therapy: WFL for tasks assessed/performed Overall Cognitive Status: No family/caregiver present to determine baseline cognitive functioning Area of Impairment: Attention;Problem solving                   Current Attention Level: Selective         Problem Solving: Slow processing General Comments: Likely baseline cognition      General Comments      Exercises     Assessment/Plan    PT Assessment Patient needs continued PT services  PT Problem List Decreased strength;Decreased activity tolerance;Decreased balance;Decreased mobility;Decreased cognition;Decreased knowledge of use of DME       PT Treatment Interventions DME instruction;Gait training;Stair training;Functional mobility training;Therapeutic activities;Therapeutic exercise;Balance training;Patient/family education    PT Goals (Current goals can be found in the Care Plan section)  Acute Rehab PT Goals Patient Stated Goal: Get stronger and stop falling PT Goal Formulation: With patient Time For Goal Achievement: 06/17/17 Potential to Achieve Goals: Good    Frequency Min 2X/week   Barriers to discharge Decreased caregiver support      Co-evaluation               AM-PAC PT "6 Clicks" Daily Activity  Outcome Measure Difficulty turning over in bed (including adjusting bedclothes, sheets and blankets)?: A Little Difficulty moving from lying on back to sitting on the side of the bed? : A Little Difficulty sitting down on and standing up from a chair with arms (e.g., wheelchair, bedside commode, etc,.)?: Unable Help needed moving to and from a bed to chair (including a wheelchair)?: A Little Help needed walking in hospital room?: A Little Help needed climbing 3-5 steps with a railing? : A Lot 6 Click Score: 15    End of Session Equipment Utilized During Treatment: Gait  belt Activity Tolerance: Patient tolerated treatment well Patient left: in chair;with call bell/phone within reach;with chair alarm set Nurse Communication: Mobility status PT Visit Diagnosis: Other abnormalities of gait and mobility (R26.89);Repeated falls (R29.6)    Time: 1645-1710 PT Time Calculation (min) (ACUTE ONLY): 25 min   Charges:   PT Evaluation $PT Eval Moderate Complexity: 1 Mod PT Treatments $Gait Training: 8-22 mins   PT G Codes:       Mabeline Caras, PT, DPT Acute Rehab Services  Pager: [place] 06/03/2017, 5:21 PM

## 2017-06-03 NOTE — Evaluation (Signed)
Clinical/Bedside Swallow Evaluation Patient Details  Name: [name] MRN: [id number] Date of Birth: [birth year]  Today's Date: 06/03/2017 Time: SLP Start Time (ACUTE ONLY): 3419 SLP Stop Time (ACUTE ONLY): 1005 SLP Time Calculation (min) (ACUTE ONLY): 12 min  Past Medical History:  Past Medical History:  Diagnosis Date  . Anemia 04/03/2011  . Anginal pain ([place])   . Anxiety   . Arthritis    "feet" (06/02/2017)  . Blind left eye 1999  . Depression   . Exertional dyspnea   . GERD (gastroesophageal reflux disease)   . High cholesterol   . Hip fracture ([place]) 07/17/11   fall from 1-2 feet; left  . Hypertension   . Ischemic stroke ([place]) 06/02/2017   "weak moving around" (06/02/2017)  . Prosthetic eye globe 1999   "from diabetic; left eye"  . Tongue cancer ([place]) 1998   S/P radiation  . Type II diabetes mellitus ([place])    not taking any medication (06/02/2017)   Past Surgical History:  Past Surgical History:  Procedure Laterality Date  . ENUCLEATION Left   . FRACTURE SURGERY    . HIP ARTHROPLASTY  07/18/2011   Procedure: ARTHROPLASTY BIPOLAR HIP;  Surgeon: Nita Sells, MD;  Location: [facility];  Service: Orthopedics;  Laterality: Left;  . INTRAOCULAR PROSTHESES INSERTION  1999   left  . TONGUE BIOPSY  1998   "cancer"  . TUBAL LIGATION  1970's   HPI:  [name] a 84 y.o.femalewithhistory of diastolic CHF, diabetes mellitus, hypertension, chronic kidney disease stage III, history of lung cancer status post radiation, history of alcohol abuse presents to the ER with complaint of chest pain. Patient states she has been having chest pain over the last 1 month with shortness of breath symptoms are mostly on exertion at times also happens on at rest. Patient also over the last 2 to 3 months has been having difficulty walking with balance issues. Denies any weakness of the upper or lower extremity or loss of function. Denies any visual symptoms difficulty speaking.  Patient did complain to the nurse that time she gets shocklike feeling when she eats. Presently chest pain-free. Patient had followed up with cardiologist Dr. Tamala Julian 3 weeks ago and at that time patient had complained of chest pain and palpitations. Patient had a Holter monitor done results of which are not known. Holter monitor was done to rule out paroxysmal atrial fibrillation. Patient at that time was noticed to be not taking her blood pressure medications. In the ER patient blood pressure was mildly elevated. Chest x-ray did not show anything acute EKG showing normal sinus rhythm troponin was negative. Since patient also complained of ataxia MRI of the brain was done which did not show any acute findings but did show new cerebellar infarcts and left hemi-ponsinfarct which are new compared to the MRI done in 2011. Patient presently is not in distress. Patient did fail swallow evaluation.   Assessment / Plan / Recommendation Clinical Impression  Pt is free of overt s/s of aspiration at bedisde with trials of thin liqudis via straw, puree and solids. Pt with functional oral phase c/b good lingual manipulation of bolus and appearance of timely swallow initiation and clear vocal quality. Pt has increased risk d/t radiation of neck and vocal quality. She does endorse difficulty "swallowing" pieces of meat such as friend chicken. This Probation officer provided strategies for types of meat and possible need for follow up with ENT. Pt able to demonstrate understanding and there is no report of  any respiratory compromise. Therefore recommend continuing current diet with thin liquids and ST to sign off. Education provided to MD and she was agreeable SLP Visit Diagnosis: Dysphagia, oral phase (R13.11)    Aspiration Risk  Mild aspiration risk    Diet Recommendation Regular;Thin liquid   Liquid Administration via: Straw Medication Administration: Whole meds with puree Supervision: Patient able to self  feed Compensations: Minimize environmental distractions;Slow rate;Small sips/bites;Follow solids with liquid Postural Changes: Seated upright at 90 degrees    Other  Recommendations Recommended Consults: Consider ENT evaluation Oral Care Recommendations: Oral care BID   Follow up Recommendations None      Frequency and Duration            Prognosis        Swallow Study   General Date of Onset: 06/02/17 HPI: Itxel Norworthyis a 84 y.o.femalewithhistory of diastolic CHF, diabetes mellitus, hypertension, chronic kidney disease stage III, history of lung cancer status post radiation, history of alcohol abuse presents to the ER with complaint of chest pain. Patient states she has been having chest pain over the last 1 month with shortness of breath symptoms are mostly on exertion at times also happens on at rest. Patient also over the last 2 to 3 months has been having difficulty walking with balance issues. Denies any weakness of the upper or lower extremity or loss of function. Denies any visual symptoms difficulty speaking. Patient did complain to the nurse that time she gets shocklike feeling when she eats. Presently chest pain-free. Patient had followed up with cardiologist Dr. Tamala Julian 3 weeks ago and at that time patient had complained of chest pain and palpitations. Patient had a Holter monitor done results of which are not known. Holter monitor was done to rule out paroxysmal atrial fibrillation. Patient at that time was noticed to be not taking her blood pressure medications. In the ER patient blood pressure was mildly elevated. Chest x-ray did not show anything acute EKG showing normal sinus rhythm troponin was negative. Since patient also complained of ataxia MRI of the brain was done which did not show any acute findings but did show new cerebellar infarcts and left hemi-ponsinfarct which are new compared to the MRI done in 2011. Patient presently is not in distress.  Patient did fail swallow evaluation. Type of Study: Bedside Swallow Evaluation Previous Swallow Assessment: none in chart Diet Prior to this Study: Regular;Thin liquids Temperature Spikes Noted: No Respiratory Status: Room air History of Recent Intubation: No Behavior/Cognition: Alert;Cooperative;Pleasant mood Oral Cavity Assessment: Within Functional Limits Oral Care Completed by SLP: No Oral Cavity - Dentition: Poor condition;Missing dentition Vision: Functional for self-feeding Self-Feeding Abilities: Able to feed self Patient Positioning: Upright in bed Baseline Vocal Quality: Hoarse;Breathy;Aphonic Volitional Cough: Strong Volitional Swallow: Able to elicit    Oral/Motor/Sensory Function Overall Oral Motor/Sensory Function: Within functional limits   Ice Chips Ice chips: Within functional limits Presentation: Spoon;Self Fed   Thin Liquid Thin Liquid: Within functional limits Presentation: Self Fed;Straw    Nectar Thick Nectar Thick Liquid: Not tested   Honey Thick Honey Thick Liquid: Not tested   Puree Puree: Within functional limits Presentation: Self Fed;Spoon   Solid   GO   Solid: Within functional limits Presentation: Self Fed;Spoon        Bennet Kujawa 06/03/2017,10:57 AM

## 2017-06-03 NOTE — Progress Notes (Signed)
Nuclear Medicine called RN to confirm that it was necessary for patient to have nuclear stress test on Jun 04, 2017, specifically after pt had VQ test performed today, 06/03/17. Nuc Med asked RN to contact MD for recommendation and to leave note in chart with MD's response, explaining situation. RN contacted MD Gwenlyn Found in Cardiology who deferred to radiologies recommendations stating if patient needs to have time between tests, then test can be performed on Friday 06/05/17. MD gave permission to remove NPO orders and orders for test but not sure how to remove test orders from system. MD and Nuc Med stated will check notes on patient in the morning.

## 2017-06-03 NOTE — Progress Notes (Signed)
PT Cancellation Note  Patient Details Name: [name] MRN: [id number] DOB: 04-Aug-1932   Cancelled Treatment:    Reason Eval/Treat Not Completed: Patient at procedure or test/unavailable. Will follow-up for PT evaluation.  Mabeline Caras, PT, DPT Acute Rehab Services  Pager: [place] 06/03/2017, 10:39 AM

## 2017-06-03 NOTE — Progress Notes (Signed)
  Echocardiogram 2D Echocardiogram has been performed.  Technically difficult study due to poor patient compliance.   [name] 06/03/2017, 3:12 PM

## 2017-06-03 NOTE — Progress Notes (Signed)
OT Cancellation Note  Patient Details Name: [name] MRN: [id number] DOB: 1932-02-03   Cancelled Treatment:    Reason Eval/Treat Not Completed: Patient at procedure or test/ unavailable. Pt off unit at nuclear med, will check back later as able/as appropriate  Britt Bottom 06/03/2017, 10:38 AM

## 2017-06-04 DIAGNOSIS — R079 Chest pain, unspecified: Secondary | ICD-10-CM | POA: Diagnosis not present

## 2017-06-04 DIAGNOSIS — E1159 Type 2 diabetes mellitus with other circulatory complications: Secondary | ICD-10-CM | POA: Diagnosis not present

## 2017-06-04 DIAGNOSIS — I200 Unstable angina: Secondary | ICD-10-CM | POA: Diagnosis not present

## 2017-06-04 DIAGNOSIS — F1010 Alcohol abuse, uncomplicated: Secondary | ICD-10-CM

## 2017-06-04 DIAGNOSIS — I5032 Chronic diastolic (congestive) heart failure: Secondary | ICD-10-CM | POA: Diagnosis not present

## 2017-06-04 DIAGNOSIS — E876 Hypokalemia: Secondary | ICD-10-CM | POA: Diagnosis not present

## 2017-06-04 DIAGNOSIS — I639 Cerebral infarction, unspecified: Secondary | ICD-10-CM | POA: Diagnosis not present

## 2017-06-04 DIAGNOSIS — N179 Acute kidney failure, unspecified: Secondary | ICD-10-CM

## 2017-06-04 LAB — GLUCOSE, CAPILLARY
GLUCOSE-CAPILLARY: 97 mg/dL (ref 65–99)
GLUCOSE-CAPILLARY: 100 mg/dL — AB (ref 65–99)
Glucose-Capillary: 111 mg/dL — ABNORMAL HIGH (ref 65–99)
Glucose-Capillary: 118 mg/dL — ABNORMAL HIGH (ref 65–99)

## 2017-06-04 LAB — MAGNESIUM: MAGNESIUM: 1.8 mg/dL (ref 1.7–2.4)

## 2017-06-04 LAB — BASIC METABOLIC PANEL
Anion gap: 11 (ref 5–15)
BUN: 21 mg/dL — ABNORMAL HIGH (ref 6–20)
CALCIUM: 9.2 mg/dL (ref 8.9–10.3)
CO2: 21 mmol/L — ABNORMAL LOW (ref 22–32)
CREATININE: 1.56 mg/dL — AB (ref 0.44–1.00)
Chloride: 106 mmol/L (ref 101–111)
GFR calc non Af Amer: 30 mL/min — ABNORMAL LOW (ref >60)
GFR, EST AFRICAN AMERICAN: 34 mL/min — AB (ref >60)
Glucose, Bld: 83 mg/dL (ref 65–99)
Potassium: 4.4 mmol/L (ref 3.5–5.1)
SODIUM: 138 mmol/L (ref 135–145)

## 2017-06-04 LAB — FOLATE RBC
FOLATE, RBC: 1058 ng/mL (ref >498)
Folate, Hemolysate: 431.7 ng/mL
HEMATOCRIT: 40.8 % (ref 34.0–46.6)

## 2017-06-04 MED ORDER — LISINOPRIL 10 MG PO TABS
10.0000 mg | ORAL_TABLET | Freq: Every day | ORAL | Status: DC
  Filled 2017-06-04: qty 1

## 2017-06-04 MED ORDER — SODIUM CHLORIDE 0.9% FLUSH: 10.0000 mL | INTRAVENOUS | Status: DC | PRN

## 2017-06-04 MED ORDER — SODIUM CHLORIDE 0.9 % IV SOLN
INTRAVENOUS | Status: DC
  Administered 2017-06-04 – 2017-06-05 (×2): via INTRAVENOUS

## 2017-06-04 MED ORDER — HYDRALAZINE HCL 25 MG PO TABS
25.0000 mg | ORAL_TABLET | Freq: Two times a day (BID) | ORAL | Status: DC
  Filled 2017-06-04: qty 1

## 2017-06-04 MED ORDER — MAGNESIUM SULFATE 2 GM/50ML IV SOLN
2.0000 g | Freq: Once | INTRAVENOUS | Status: AC
  Administered 2017-06-04: 2 g via INTRAVENOUS
  Filled 2017-06-04: qty 50

## 2017-06-04 MED ORDER — ENOXAPARIN SODIUM 30 MG/0.3ML ~~LOC~~ SOLN
30.0000 mg | Freq: Every day | SUBCUTANEOUS | Status: DC
  Administered 2017-06-05: 30 mg via SUBCUTANEOUS
  Filled 2017-06-04: qty 0.3

## 2017-06-04 NOTE — Progress Notes (Signed)
Occupational Therapy Evaluation Patient Details Name: [name] MRN: [id number] DOB: 12/01/1932 Today's Date: 06/04/2017    History of Present Illness Pt is an 84 y.o. female admitted 06/02/17 with c/o chest pain and SOB; also c/o 2-3 months of difficulty walking with balance issues. MRI shows new strokes in cerebellar and pons compared to imaging in 2011; neurology advised no stroke work-up. PMH includes HTN, CHF, CKD III, DM, alcohol use.   Clinical Impression   PTA, pt independent with mobility and ADL and lived alone. Pt currently requires min A with mobility and ADL @ RW level. Feel pt will benefit from rehab at SNF to facilitate return to PLOF and reduce risk of falls. Will follow acutely to address established goals.     Follow Up Recommendations  SNF    Equipment Recommendations  3 in 1 bedside commode    Recommendations for Other Services       Precautions / Restrictions Precautions Precautions: Fall Restrictions Weight Bearing Restrictions: No      Mobility Bed Mobility Overal bed mobility: Modified Independent                Transfers Overall transfer level: Needs assistance   Transfers: Sit to/from Stand Sit to Stand: Min assist         General transfer comment: posterior bias initially    Balance Overall balance assessment: Needs assistance   Sitting balance-Leahy Scale: Good       Standing balance-Leahy Scale: Fair                             ADL either performed or assessed with clinical judgement   ADL Overall ADL's : Needs assistance/impaired Eating/Feeding: Independent   Grooming: Min guard;Standing   Upper Body Bathing: Supervision/ safety;Standing   Lower Body Bathing: Minimal assistance;Sit to/from stand   Upper Body Dressing : Supervision/safety;Set up;Sitting   Lower Body Dressing: Minimal assistance;Sit to/from stand   Toilet Transfer: Minimal assistance;RW;Ambulation;Comfort height toilet    Toileting- Clothing Manipulation and Hygiene: Min guard;Sit to/from stand       Functional mobility during ADLs: Minimal assistance;Rolling walker;Cueing for safety       Vision Baseline Vision/History: Wears glasses Patient Visual Report: (Blind L eye) Additional Comments: appears at baseline     Perception     Praxis      Pertinent Vitals/Pain Pain Assessment: No/denies pain     Hand Dominance Right   Extremity/Trunk Assessment Upper Extremity Assessment Upper Extremity Assessment: Generalized weakness   Lower Extremity Assessment Lower Extremity Assessment: Generalized weakness   Cervical / Trunk Assessment Cervical / Trunk Assessment: Kyphotic   Communication Communication Communication: No difficulties   Cognition Arousal/Alertness: Awake/alert Behavior During Therapy: WFL for tasks assessed/performed   Area of Impairment: Attention;Safety/judgement                   Current Attention Level: Selective     Safety/Judgement: Decreased awareness of safety;Decreased awareness of deficits     General Comments: using RW at sink and and attempted to walk awawy form sink without RW   General Comments       Exercises     Shoulder Instructions      Home Living Family/patient expects to be discharged to:: Private residence Living Arrangements: Alone Available Help at Discharge: Family;Available PRN/intermittently(granddaughter) Type of Home: House Home Access: Stairs to enter [employer] of Steps: 2 Entrance Stairs-Rails: None Home Layout: One level  Bathroom Shower/Tub: Teacher, early years/pre: Handicapped height(BSC over toilet)     Home Equipment: Cane - single point;Tub bench;Bedside commode   Additional Comments: Pt reports granddaughter not able to provide consistent assist      Prior Functioning/Environment Level of Independence: Independent        Comments: Pt reports indep with ambulation and ADLs;  does not drive, uses bus for transportation. Endorses multiple falls in the past few months.        OT Problem List: Impaired balance (sitting and/or standing);Decreased cognition;Decreased safety awareness;Decreased knowledge of use of DME or AE      OT Treatment/Interventions: Self-care/ADL training;Therapeutic exercise;DME and/or AE instruction;Neuromuscular education;Therapeutic activities;Cognitive remediation/compensation;Patient/family education;Balance training    OT Goals(Current goals can be found in the care plan section) Acute Rehab OT Goals Patient Stated Goal: get stronger and go back home OT Goal Formulation: With patient Time For Goal Achievement: 06/18/17 Potential to Achieve Goals: Good  OT Frequency: Min 2X/week   Barriers to D/C:            Co-evaluation              AM-PAC PT "6 Clicks" Daily Activity     Outcome Measure Help from another person eating meals?: None Help from another person taking care of personal grooming?: None Help from another person toileting, which includes using toliet, bedpan, or urinal?: A Little Help from another person bathing (including washing, rinsing, drying)?: A Little Help from another person to put on and taking off regular upper body clothing?: A Little Help from another person to put on and taking off regular lower body clothing?: A Little 6 Click Score: 20   End of Session Equipment Utilized During Treatment: Gait belt;Rolling walker Nurse Communication: Mobility status  Activity Tolerance: Patient tolerated treatment well Patient left: in chair;with call bell/phone within reach;with chair alarm set;with family/visitor present  OT Visit Diagnosis: Unsteadiness on feet (R26.81);Other abnormalities of gait and mobility (R26.89);Muscle weakness (generalized) (M62.81);Other symptoms and signs involving cognitive function                Time: 1011-1040 OT Time Calculation (min): 29 min Charges:  OT General  Charges $OT Visit: 1 Visit OT Evaluation $OT Eval Moderate Complexity: 1 Mod OT Treatments $Self Care/Home Management : 8-22 mins G-Codes:     Maurie Boettcher, OT/L  OT Clinical Specialist [phone]   [facility] 06/04/2017, 2:00 PM

## 2017-06-04 NOTE — Progress Notes (Addendum)
Progress Note  Patient Name: [name] Date of Encounter: 06/04/2017  Primary Cardiologist: Sinclair Grooms, MD   Subjective   Pt continues to have intermittent chest pain. Waiting for [facility]  Inpatient Medications    Scheduled Meds: . aspirin  300 mg Rectal Daily   Or  . aspirin  325 mg Oral Daily  . atorvastatin  20 mg Oral q1800  . brimonidine  1 drop Right Eye BID  . enoxaparin (LOVENOX) injection  40 mg Subcutaneous Daily  . folic acid  1 mg Oral Daily  . gabapentin  100 mg Oral TID  . hydrALAZINE  25 mg Oral BID  . insulin aspart  0-9 Units Subcutaneous TID WC  . isosorbide mononitrate  30 mg Oral Daily  . latanoprost  1 drop Right Eye QHS  . lisinopril  10 mg Oral Daily  . multivitamin with minerals  1 tablet Oral Daily  . pantoprazole  80 mg Oral Daily  . thiamine  100 mg Oral Daily   Or  . thiamine  100 mg Intravenous Daily   Continuous Infusions: . cefTRIAXone (ROCEPHIN)  IV 1 g (06/04/17 0918)   PRN Meds: acetaminophen **OR** acetaminophen (TYLENOL) oral liquid 160 mg/5 mL **OR** acetaminophen, hydrALAZINE, LORazepam **OR** LORazepam, nitroGLYCERIN   Vital Signs    Vitals:   06/03/17 2030 06/03/17 2349 06/04/17 0358 06/04/17 0742  BP: (!) 126/95 (!) 107/55 106/61 107/62  Pulse: 72 (!) 56 (!) 106 62  Resp: 20 18 18 18   Temp: 97.8 F (36.6 C) (!) 97.4 F (36.3 C) 97.7 F (36.5 C) 98 F (36.7 C)  TempSrc: Oral Oral Oral Oral  SpO2: 92% 98% 95% 95%  Weight:      Height:        Intake/Output Summary (Last 24 hours) at 06/04/2017 0956 Last data filed at 06/04/2017 0855 Gross per 24 hour  Intake 630 ml  Output 360 ml  Net 270 ml   Filed Weights   06/02/17 1126 06/02/17 2140  Weight: 130 lb (59 kg) 134 lb 11.2 oz (61.1 kg)    Telemetry    Sinus with PVCs - Personally Reviewed  ECG    No new tracings - Personally Reviewed  Physical Exam   GEN: No acute distress.   Neck: No JVD Cardiac: irregular rhythm regular  rate Respiratory: coarse sounds in bases, respirations unlabored GI: Soft, nontender, non-distended  MS: No edema; No deformity. Neuro:  Nonfocal  Psych: Normal affect   Labs    Chemistry Recent Labs  Lab 06/02/17 1128 06/02/17 1957 06/03/17 0505  NA 139  --  144  K 4.1  --  3.4*  CL 109  --  108  CO2 17*  --  25  GLUCOSE 175*  --  84  BUN 15  --  9  CREATININE 1.15*  --  0.91  CALCIUM 9.1  --  9.2  PROT  --  8.1 7.2  ALBUMIN  --  3.8 3.2*  AST  --  75* 63*  ALT  --  61* 52  ALKPHOS  --  85 70  BILITOT  --  0.9 0.7  GFRNONAA 43*  --  57*  GFRAA 50*  --  >60  ANIONGAP 13  --  11     Hematology Recent Labs  Lab 06/02/17 1807 06/03/17 0505  WBC 5.9 5.1  RBC 5.05 4.71  HGB 14.7 13.7  HCT 44.4 41.3  MCV 87.9 87.7  MCH 29.1 29.1  MCHC  33.1 33.2  RDW 14.4 14.3  PLT 254 233    Cardiac Enzymes Recent Labs  Lab 06/02/17 2326 06/03/17 0505 06/03/17 1140  TROPONINI <0.03 <0.03 <0.03    Recent Labs  Lab 06/02/17 1130  TROPIPOC 0.01     BNPNo results for input(s): BNP, PROBNP in the last 168 hours.   DDimer  Recent Labs  Lab 06/02/17 2326  DDIMER 2.11*     Radiology    Dg Chest 2 View  Result Date: 06/02/2017 CLINICAL DATA:  Chest pain. EXAM: CHEST - 2 VIEW COMPARISON:  Radiographs of November 22, 2016. FINDINGS: The heart size and mediastinal contours are within normal limits. Both lungs are clear. Atherosclerosis of thoracic aorta is noted. No pneumothorax or pleural effusion is noted. The visualized skeletal structures are unremarkable. IMPRESSION: No active cardiopulmonary disease. Aortic Atherosclerosis (ICD10-I70.0). Electronically Signed   By: Marijo Conception, M.D.   On: 06/02/2017 11:57   Mr Brain Wo Contrast  Result Date: 06/02/2017 CLINICAL DATA:  84 y/o F; ataxia, stroke suspected. History of gum cancer status post radiation. EXAM: MRI HEAD WITHOUT CONTRAST TECHNIQUE: Multiplanar, multiecho pulse sequences of the brain and surrounding  structures were obtained without intravenous contrast. COMPARISON:  10/12/2009 CT head.  06/06/2009 MRI head. FINDINGS: Brain: No acute infarction, hemorrhage, hydrocephalus, extra-axial collection or mass lesion. Small chronic infarctions are present within the bilateral cerebellar hemispheres and the left hemi pons, new from 2011. Mild progression of moderate chronic microvascular ischemic changes and parenchymal volume loss of the brain for age. Vascular: Normal flow voids. Skull and upper cervical spine: Hyperostosis frontalis interna. Sinuses/Orbits: Negative.  Left globe replacement. Other: None. IMPRESSION: 1. No acute intracranial abnormality identified. 2. Mild progression of chronic microvascular ischemic changes and parenchymal volume loss of the brain from 2011. 3. Multiple new small chronic infarctions within the bilateral cerebellar hemispheres and left hemi pons. Electronically Signed   By: Kristine Garbe M.D.   On: 06/02/2017 19:05   Nm Pulmonary Perf And Vent  Result Date: 06/03/2017 CLINICAL DATA:  Shortness of breath and chest pain EXAM: NUCLEAR MEDICINE VENTILATION - PERFUSION LUNG SCAN VIEWS: Anterior, posterior, left lateral, right lateral, RPO, LPO, RAO, LAO-ventilation and perfusion RADIOPHARMACEUTICALS:  32.6 mCi of Tc-29m DTPA aerosol inhalation and 4.3 mCi Tc64m-MAA IV COMPARISON:  Chest radiograph Jun 02, 2017 FINDINGS: Ventilation: Radiotracer uptake on the ventilation study is homogeneous and symmetric bilaterally. No ventilation defects are evident. Perfusion: Radiotracer uptake on the perfusion study is homogeneous and symmetric bilaterally. No perfusion defects are evident. IMPRESSION: No appreciable ventilation or perfusion defects. This study constitutes a very low probability of pulmonary embolus. Electronically Signed   By: Lowella Grip III M.D.   On: 06/03/2017 12:11    Cardiac Studies   Myoview pending  Echo final read pending  Patient Profile       84 y.o. female with a PMH of chronic diastolic CHF, HTN, DM type 2, CKD stage III, history of lung cancer s/p radiation, and alcohol abuse. He presented to [facility] with chest pain.   Assessment & Plan    1. Chest pain - plan for NST today - echo pending - pt continues to have chest pain intermittently - she is on imdur - consider increasing this to 60 mg vs 30 mg BID - pressures are marginal today - continue ASA and statin  2. Chronic diastolic heart failure - echo pending - does not appear fluid overloaded  3. HTN - marginal pressures today  4. Ischemic  stroke - per primary team  5. DM - per primary team  6. Hypokalemia - labs pending   For questions or updates, please contact [place] Please consult [email or web address] for contact info under Cardiology/STEMI.      Signed, Tami Lin Duke, PA  06/04/2017, 9:56 AM    Agree with note by Fabian Sharp PA-C  On and off CP. Will get Lexiscan tomorrow because of interference with VQ scan yesterday.    Lorretta Harp, M.D., [place], [facility], [place] [address] [place], [place]  [address]  [phone] 06/04/2017 10:34 AM

## 2017-06-04 NOTE — Care Management Note (Signed)
Case Management Note  Patient Details  Name: [name] MRN: [id number] Date of Birth: 1933-01-08  Subjective/Objective:    Pt in with chest pain. She is from home alone.                Action/Plan: PT recommending SNF. Pt to have stress test in am. CM following for d/c disposition after testing complete.  Expected Discharge Date:                  Expected Discharge Plan:  Skilled Nursing Facility  In-House Referral:  Clinical Social Work  Discharge planning Services     Post Acute Care Choice:    Choice offered to:     DME Arranged:    DME Agency:     HH Arranged:    [place] Agency:     Status of Service:  In process, will continue to follow  If discussed at Long Length of Stay Meetings, dates discussed:    Additional Comments:  Pollie Friar, RN 06/04/2017, 3:50 PM

## 2017-06-04 NOTE — Clinical Social Work Note (Signed)
Clinical Social Work Assessment  Patient Details  Name: [name] MRN: [id number] Date of Birth: 08-18-1932  Date of referral:  06/04/17               Reason for consult:  Facility Placement                Permission sought to share information with:  Facility Art therapist granted to share information::  Yes, Verbal Permission Granted  Name::        Agency::  SNF  Relationship::     Contact Information:     Housing/Transportation Living arrangements for the past 2 months:  Single Family Home Source of Information:  Patient Patient Interpreter Needed:  None Criminal Activity/Legal Involvement Pertinent to Current Situation/Hospitalization:  No - Comment as needed Significant Relationships:  Adult Children, Other Family Members Lives with:  Self Do you feel safe going back to the place where you live?  Yes Need for family participation in patient care:  No (Coment)  Care giving concerns:  Patient from home alone and will need SNF at discharge prior to returning home.   Social Worker assessment / plan:  CSW met with patient and patient's daughter at bedside to discuss recommendation for SNF. CSW discussed patient's weakness due to illness and how she just needs a little strengthening before she returns home. CSW discussed facility options with the patient and received permission to look into SNF placement.  Employment status:  Retired Nurse, adult PT Recommendations:  [place] / Referral to community resources:  [place]  Patient/Family's Response to care:  Patient agreeable to SNF placement.  Patient/Family's Understanding of and Emotional Response to Diagnosis, Current Treatment, and Prognosis:  Patient is aware that she needs to be a little bit stronger before she can go home on her own. Patient discussed how she had been to SNF before but couldn't remember the name of it. Patient  thought for a little while during discussion, and was able to remember that it was [facility]. Patient would like to return there if possible.  Emotional Assessment Appearance:  Appears stated age Attitude/Demeanor/Rapport:  Engaged Affect (typically observed):  Pleasant, Appropriate Orientation:  Oriented to Self, Oriented to Place, Oriented to  Time, Oriented to Situation Alcohol / Substance use:  Not Applicable Psych involvement (Current and /or in the community):  No (Comment)  Discharge Needs  Concerns to be addressed:  Care Coordination Readmission within the last 30 days:  No Current discharge risk:  Dependent with Mobility Barriers to Discharge:  Continued Medical Work up, [place], [place] 06/04/2017, 4:22 PM

## 2017-06-04 NOTE — NC FL2 (Signed)
[place] LEVEL OF CARE SCREENING TOOL     IDENTIFICATION  Patient Name: [name] Birthdate: 12-Jun-1932 Sex: female Admission Date (Current Location): 06/02/2017  [facility] and [place] Number:  Herbalist and Address:  [facility] [place] [place], [place] [address], [place],  [address]      Provider Number: [id number]  Attending Physician Name and Address:  Geradine Girt, DO  Relative Name and Phone Number:       Current Level of Care: Hospital Recommended Level of Care: [place] Prior Approval Number:    Date Approved/Denied:   PASRR Number: [id number]  Discharge Plan: SNF    Current Diagnoses: Patient Active Problem List   Diagnosis Date Noted  . Ischemic stroke ([place]) 06/02/2017  . Type 2 diabetes mellitus with vascular disease ([place]) 06/02/2017  . CKD (chronic kidney disease) stage 2, GFR 60-89 ml/min 06/02/2017  . Chest pain 06/02/2017  . Closed non-physeal fracture of metatarsal bone of right foot 07/31/2016  . CAP (community acquired pneumonia)   . Hip swelling 01/17/2015  . Chest tightness 02/17/2014  . Hypertensive urgency 02/17/2014  . Chronic diastolic heart failure ([place]) 03/15/2013  . Bacteremia 07/22/2011  . UTI (lower urinary tract infection) 07/21/2011  . Closed left hip fracture ([place]) 07/17/2011  . HTN (hypertension) 07/17/2011  . DM2 (diabetes mellitus, type 2) ([place]) 07/17/2011  . Hyperlipidemia 07/17/2011  . Anemia 04/03/2011    Orientation RESPIRATION BLADDER Height & Weight     Self, Time, Situation, Place  Normal Continent Weight: 134 lb 11.2 oz (61.1 kg) Height:  5' (152.4 cm)  BEHAVIORAL SYMPTOMS/MOOD NEUROLOGICAL BOWEL NUTRITION STATUS      Continent Diet(heart healthy)  AMBULATORY STATUS COMMUNICATION OF NEEDS Skin   Limited Assist Verbally Normal                       Personal Care Assistance Level of Assistance  Bathing, Feeding, Dressing Bathing Assistance:  Limited assistance Feeding assistance: Independent Dressing Assistance: Limited assistance     Functional Limitations Info  Sight, Hearing, Speech Sight Info: Adequate Hearing Info: Adequate Speech Info: Adequate    SPECIAL CARE FACTORS FREQUENCY  PT (By licensed PT), OT (By licensed OT)     PT Frequency: 5x/wk OT Frequency: 5x/wk            Contractures Contractures Info: Not present    Additional Factors Info  Code Status, Allergies, Insulin Sliding Scale Code Status Info: Full Allergies Info: NKA   Insulin Sliding Scale Info: 0-9 units 3x/day with meals       Current Medications (06/04/2017):  This is the current hospital active medication list Current Facility-Administered Medications  Medication Dose Route Frequency Provider Last Rate Last Dose  . acetaminophen (TYLENOL) tablet 650 mg  650 mg Oral Q4H PRN Rise Patience, MD       Or  . acetaminophen (TYLENOL) solution 650 mg  650 mg Per Tube Q4H PRN Rise Patience, MD       Or  . acetaminophen (TYLENOL) suppository 650 mg  650 mg Rectal Q4H PRN Rise Patience, MD      . amLODipine (NORVASC) tablet 10 mg  10 mg Oral Daily Rise Patience, MD   10 mg at 06/03/17 1147  . aspirin suppository 300 mg  300 mg Rectal Daily Rise Patience, MD       Or  . aspirin tablet 325 mg  325 mg Oral Daily  Rise Patience, MD   325 mg at 06/03/17 1147  . atorvastatin (LIPITOR) tablet 20 mg  20 mg Oral q1800 Rise Patience, MD   20 mg at 06/03/17 1742  . brimonidine (ALPHAGAN) 0.15 % ophthalmic solution 1 drop  1 drop Right Eye BID Rise Patience, MD   1 drop at 06/03/17 2153  . cefTRIAXone (ROCEPHIN) 1 g in sodium chloride 0.9 % 100 mL IVPB  1 g Intravenous Daily Rise Patience, MD   Stopped at 06/03/17 1219  . enoxaparin (LOVENOX) injection 40 mg  40 mg Subcutaneous Daily Rise Patience, MD   40 mg at 06/03/17 1146  . folic acid (FOLVITE) tablet 1 mg  1 mg Oral Daily  Rise Patience, MD   1 mg at 06/03/17 1148  . gabapentin (NEURONTIN) capsule 100 mg  100 mg Oral TID Rise Patience, MD   100 mg at 06/03/17 2152  . hydrALAZINE (APRESOLINE) injection 10 mg  10 mg Intravenous Q4H PRN Rise Patience, MD      . hydrALAZINE (APRESOLINE) tablet 50 mg  50 mg Oral BID Eulogio Bear U, DO   50 mg at 06/03/17 2152  . insulin aspart (novoLOG) injection 0-9 Units  0-9 Units Subcutaneous TID WC Rise Patience, MD   1 Units at 06/03/17 1743  . isosorbide mononitrate (IMDUR) 24 hr tablet 30 mg  30 mg Oral Daily Rise Patience, MD   30 mg at 06/03/17 1148  . latanoprost (XALATAN) 0.005 % ophthalmic solution 1 drop  1 drop Right Eye QHS Rise Patience, MD   1 drop at 06/03/17 2153  . lisinopril (PRINIVIL,ZESTRIL) tablet 40 mg  40 mg Oral Daily Rise Patience, MD   40 mg at 06/03/17 1148  . LORazepam (ATIVAN) tablet 1 mg  1 mg Oral Q6H PRN Rise Patience, MD       Or  . LORazepam (ATIVAN) injection 1 mg  1 mg Intravenous Q6H PRN Rise Patience, MD      . multivitamin with minerals tablet 1 tablet  1 tablet Oral Daily Rise Patience, MD   1 tablet at 06/03/17 1147  . nitroGLYCERIN (NITROSTAT) SL tablet 0.4 mg  0.4 mg Sublingual Q5 min PRN Rise Patience, MD      . pantoprazole (PROTONIX) EC tablet 80 mg  80 mg Oral Daily Eulogio Bear U, DO   80 mg at 06/03/17 1219  . thiamine (VITAMIN B-1) tablet 100 mg  100 mg Oral Daily Rise Patience, MD   100 mg at 06/03/17 1147   Or  . thiamine (B-1) injection 100 mg  100 mg Intravenous Daily Rise Patience, MD         Discharge Medications: Please see discharge summary for a list of discharge medications.  Relevant Imaging Results:  Relevant Lab Results:   Additional Information SS#: [id number]  Geralynn Ochs, LCSW

## 2017-06-04 NOTE — Progress Notes (Signed)
Progress Note    [name]  KNL:[id number] DOB: 1932/03/04  DOA: 06/02/2017 PCP: Dierdre Harness, FNP    Brief Narrative:    Medical records reviewed and are as summarized below:  [name] is an 84 y.o. female  with history of diastolic CHF, diabetes mellitus, hypertension, chronic kidney disease stage III, history of lung cancer status post radiation, history of alcohol abuse presents to the ER with complaint of chest pain.  Patient states she has been having chest pain over the last 1 month with shortness of breath symptoms are mostly on exertion at times also happens on at rest.  Patient also over the last 2 to 3 months has been having difficulty walking with balance issues.  Patient had followed up with cardiologist Dr. Tamala Julian 3 weeks ago and at that time patient had complained of chest pain and palpitations.  Patient had a Holter monitor done results of which are not known.  Holter monitor was done to rule out paroxysmal atrial fibrillation.  Patient at that time was noticed to be not taking her blood pressure medications.    Assessment/Plan:   Principal Problem:   Chest pain Active Problems:   Chronic diastolic heart failure (HCC)   Hypertensive urgency   Ischemic stroke (HCC)   Type 2 diabetes mellitus with vascular disease (HCC)   CKD (chronic kidney disease) stage 2, GFR 60-89 ml/min   Chest pain with exertional shortness of breath -recent Holter monitor results are not in chart -V/Q scan low risk -CE negative thus far -plan for stress test in AM  Ataxia  -MRI of the brain shows new strokes in the cerebellar and pons compared to the one in 2011.  admiting MD Discussed with neurologist Dr. Malen Gauze who at this time advised no stroke work-up.   -B12 normal  Hypertension- BP low -patient does not take medications at home -hold all meds -gentle IVF  AKI on CKD stage III -IVf -recheck in AM  Mildly elevated LFTs.   -Likely from alcoholism.     -monitor  UTI -e coli -continue ceftriaxone   Chronic diastolic CHF last EF measured in 2017 was 55 to 60%. - no current volume overload  Diabetes mellitus type 2 - well controlled - hemoglobin A1c: 5.7 -SSI  Alcohol use -CIWA.  GERD -on PPI  History of mouth cancer status post radiation therapy.  Hyperlipidemia  -on statin -watch LFTs    Body mass index is 26.31 kg/m.   Family Communication/Anticipated D/C date and plan/Code Status   DVT prophylaxis: Lovenox ordered. Code Status: Full Code.  Family Communication: at bedside Disposition Plan: sNF when work up [place]:   cards  Subjective:   Says she has not been taking her BP meds at home  Objective:    Vitals:   06/03/17 2349 06/04/17 0358 06/04/17 0742 06/04/17 1138  BP: (!) 107/55 106/61 107/62 (!) 85/64  Pulse: (!) 56 (!) 106 62 60  Resp: 18 18 18 18   Temp: (!) 97.4 F (36.3 C) 97.7 F (36.5 C) 98 F (36.7 C) 97.8 F (36.6 C)  TempSrc: Oral Oral Oral Oral  SpO2: 98% 95% 95% 95%  Weight:      Height:        Intake/Output Summary (Last 24 hours) at 06/04/2017 1401 Last data filed at 06/04/2017 0855 Gross per 24 hour  Intake 270 ml  Output -  Net 270 ml   Filed Weights   06/02/17 1126 06/02/17 2140  Weight: 59 kg (130 lb) 61.1 kg (134 lb 11.2 oz)    Exam: In bed, pleasant and cooperative rrr No wheezing, no increased work of breathing  Data Reviewed:   I have personally reviewed following labs and imaging studies:  Labs: Labs show the following:   Basic Metabolic Panel: Recent Labs  Lab 06/02/17 1128 06/03/17 0505 06/04/17 0924 06/04/17 1025  NA 139 144  --  138  K 4.1 3.4*  --  4.4  CL 109 108  --  106  CO2 17* 25  --  21*  GLUCOSE 175* 84  --  83  BUN 15 9  --  21*  CREATININE 1.15* 0.91  --  1.56*  CALCIUM 9.1 9.2  --  9.2  MG  --   --  1.8  --    GFR Estimated Creatinine Clearance: 22.3 mL/min (A) (by C-G formula based on SCr of  1.56 mg/dL (H)). Liver Function Tests: Recent Labs  Lab 06/02/17 1957 06/03/17 0505  AST 75* 63*  ALT 61* 52  ALKPHOS 85 70  BILITOT 0.9 0.7  PROT 8.1 7.2  ALBUMIN 3.8 3.2*   Recent Labs  Lab 06/03/17 0822  LIPASE 36   No results for input(s): AMMONIA in the last 168 hours. Coagulation profile Recent Labs  Lab 06/02/17 1957  INR 0.91    CBC: Recent Labs  Lab 06/02/17 1807 06/03/17 0505  WBC 5.9 5.1  HGB 14.7 13.7  HCT 44.4 41.3  MCV 87.9 87.7  PLT 254 233   Cardiac Enzymes: Recent Labs  Lab 06/02/17 2326 06/03/17 0505 06/03/17 1140  TROPONINI <0.03 <0.03 <0.03   BNP (last 3 results) No results for input(s): PROBNP in the last 8760 hours. CBG: Recent Labs  Lab 06/03/17 1114 06/03/17 1647 06/03/17 2109 06/04/17 0559 06/04/17 1110  GLUCAP 99 148* 99 97 111*   D-Dimer: Recent Labs    06/02/17 2326  DDIMER 2.11*   Hgb A1c: Recent Labs    06/03/17 0505  HGBA1C 5.7*   Lipid Profile: Recent Labs    06/03/17 0505  CHOL 154  HDL 56  LDLCALC 76  TRIG 111  CHOLHDL 2.8   Thyroid function studies: No results for input(s): TSH, T4TOTAL, T3FREE, THYROIDAB in the last 72 hours.  Invalid input(s): FREET3 Anemia work up: Recent Labs    06/03/17 0822  VITAMINB12 612   Sepsis Labs: Recent Labs  Lab 06/02/17 1807 06/03/17 0505  WBC 5.9 5.1    Microbiology Recent Results (from the past 240 hour(s))  Culture, Urine     Status: Abnormal (Preliminary result)   Collection Time: 06/03/17  8:08 AM  Result Value Ref Range Status   Specimen Description URINE, CLEAN CATCH  Final   Special Requests   Final    NONE Performed at [facility], [place] [address] [place], [place] [address]    Culture >=100,000 COLONIES/mL ESCHERICHIA COLI (A)  Final   Report Status PENDING  Incomplete    Procedures and diagnostic studies:  Mr Brain Wo Contrast  Result Date: 06/02/2017 CLINICAL DATA:  84 y/o F; ataxia, stroke suspected. History of gum  cancer status post radiation. EXAM: MRI HEAD WITHOUT CONTRAST TECHNIQUE: Multiplanar, multiecho pulse sequences of the brain and surrounding structures were obtained without intravenous contrast. COMPARISON:  10/12/2009 CT head.  06/06/2009 MRI head. FINDINGS: Brain: No acute infarction, hemorrhage, hydrocephalus, extra-axial collection or mass lesion. Small chronic infarctions are present within the bilateral cerebellar hemispheres and the left hemi pons,  new from 2011. Mild progression of moderate chronic microvascular ischemic changes and parenchymal volume loss of the brain for age. Vascular: Normal flow voids. Skull and upper cervical spine: Hyperostosis frontalis interna. Sinuses/Orbits: Negative.  Left globe replacement. Other: None. IMPRESSION: 1. No acute intracranial abnormality identified. 2. Mild progression of chronic microvascular ischemic changes and parenchymal volume loss of the brain from 2011. 3. Multiple new small chronic infarctions within the bilateral cerebellar hemispheres and left hemi pons. Electronically Signed   By: Kristine Garbe M.D.   On: 06/02/2017 19:05   Nm Pulmonary Perf And Vent  Result Date: 06/03/2017 CLINICAL DATA:  Shortness of breath and chest pain EXAM: NUCLEAR MEDICINE VENTILATION - PERFUSION LUNG SCAN VIEWS: Anterior, posterior, left lateral, right lateral, RPO, LPO, RAO, LAO-ventilation and perfusion RADIOPHARMACEUTICALS:  32.6 mCi of Tc-17m DTPA aerosol inhalation and 4.3 mCi Tc8m-MAA IV COMPARISON:  Chest radiograph Jun 02, 2017 FINDINGS: Ventilation: Radiotracer uptake on the ventilation study is homogeneous and symmetric bilaterally. No ventilation defects are evident. Perfusion: Radiotracer uptake on the perfusion study is homogeneous and symmetric bilaterally. No perfusion defects are evident. IMPRESSION: No appreciable ventilation or perfusion defects. This study constitutes a very low probability of pulmonary embolus. Electronically Signed   By:  Lowella Grip III M.D.   On: 06/03/2017 12:11    Medications:   . aspirin  300 mg Rectal Daily   Or  . aspirin  325 mg Oral Daily  . atorvastatin  20 mg Oral q1800  . brimonidine  1 drop Right Eye BID  . [START ON 06/05/2017] enoxaparin (LOVENOX) injection  30 mg Subcutaneous Daily  . folic acid  1 mg Oral Daily  . gabapentin  100 mg Oral TID  . insulin aspart  0-9 Units Subcutaneous TID WC  . isosorbide mononitrate  30 mg Oral Daily  . latanoprost  1 drop Right Eye QHS  . multivitamin with minerals  1 tablet Oral Daily  . pantoprazole  80 mg Oral Daily  . thiamine  100 mg Oral Daily   Or  . thiamine  100 mg Intravenous Daily   Continuous Infusions: . sodium chloride 75 mL/hr at 06/04/17 1200  . cefTRIAXone (ROCEPHIN)  IV Stopped (06/04/17 0948)     LOS: 0 days   Geradine Girt  [facility]   *Please refer to [email or web address], password [email or web address] to get updated schedule on who will round on this patient, as hospitalists switch teams weekly. If 7PM-7AM, please contact night-coverage at [email or web address], password [email or web address] for any overnight needs.  06/04/2017, 2:01 PM

## 2017-06-04 NOTE — Progress Notes (Signed)
called by tele that patient had an 8 beat run of vtach; on call provider notified.

## 2017-06-05 ENCOUNTER — Other Ambulatory Visit: Payer: Self-pay

## 2017-06-05 ENCOUNTER — Observation Stay (HOSPITAL_BASED_OUTPATIENT_CLINIC_OR_DEPARTMENT_OTHER): Payer: Medicare Other

## 2017-06-05 DIAGNOSIS — R079 Chest pain, unspecified: Secondary | ICD-10-CM

## 2017-06-05 DIAGNOSIS — I208 Other forms of angina pectoris: Secondary | ICD-10-CM

## 2017-06-05 DIAGNOSIS — I639 Cerebral infarction, unspecified: Secondary | ICD-10-CM | POA: Diagnosis not present

## 2017-06-05 LAB — NM MYOCAR MULTI W/SPECT W/WALL MOTION / EF
CHL CUP MPHR: 137 {beats}/min
CHL CUP RESTING HR STRESS: 77 {beats}/min
CSEPHR: 6 %
CSEPPHR: 89 {beats}/min
Estimated workload: 1 METS
Exercise duration (min): 7 min
Exercise duration (sec): 44 s

## 2017-06-05 LAB — URINE CULTURE: Culture: >=100000 — AB

## 2017-06-05 LAB — GLUCOSE, CAPILLARY
GLUCOSE-CAPILLARY: 113 mg/dL — AB (ref 65–99)
Glucose-Capillary: 87 mg/dL (ref 65–99)

## 2017-06-05 MED ORDER — THIAMINE HCL 100 MG PO TABS: 100.0000 mg | ORAL_TABLET | Freq: Every day | ORAL | Status: DC

## 2017-06-05 MED ORDER — REGADENOSON 0.4 MG/5ML IV SOLN
0.4000 mg | Freq: Once | INTRAVENOUS | Status: AC
  Administered 2017-06-05: 0.4 mg via INTRAVENOUS
  Filled 2017-06-05: qty 5

## 2017-06-05 MED ORDER — REGADENOSON 0.4 MG/5ML IV SOLN
INTRAVENOUS | Status: AC
  Filled 2017-06-05: qty 5

## 2017-06-05 MED ORDER — INSULIN ASPART 100 UNIT/ML ~~LOC~~ SOLN: 0.0000 [IU] | Freq: Three times a day (TID) | SUBCUTANEOUS | 11 refills | Status: DC

## 2017-06-05 MED ORDER — HYDROCODONE-ACETAMINOPHEN 5-325 MG PO TABS: ORAL_TABLET | ORAL | 0 refills | Status: DC

## 2017-06-05 MED ORDER — HYDROCODONE-ACETAMINOPHEN 5-325 MG PO TABS: 1.0000 | ORAL_TABLET | Freq: Four times a day (QID) | ORAL | 0 refills | Status: DC | PRN

## 2017-06-05 MED ORDER — ACETAMINOPHEN 325 MG PO TABS: 650.0000 mg | ORAL_TABLET | Freq: Four times a day (QID) | ORAL | Status: DC | PRN

## 2017-06-05 MED ORDER — TECHNETIUM TC 99M TETROFOSMIN IV KIT
10.0000 | PACK | Freq: Once | INTRAVENOUS | Status: AC | PRN
  Administered 2017-06-05: 10 via INTRAVENOUS

## 2017-06-05 MED ORDER — ADULT MULTIVITAMIN W/MINERALS CH: 1.0000 | ORAL_TABLET | Freq: Every day | ORAL | Status: DC

## 2017-06-05 MED ORDER — ISOSORBIDE MONONITRATE ER 60 MG PO TB24: 60.0000 mg | ORAL_TABLET | Freq: Every day | ORAL | Status: DC

## 2017-06-05 MED ORDER — TECHNETIUM TC 99M TETROFOSMIN IV KIT
30.0000 | PACK | Freq: Once | INTRAVENOUS | Status: AC | PRN
  Administered 2017-06-05: 30 via INTRAVENOUS

## 2017-06-05 MED ORDER — CARVEDILOL 25 MG PO TABS: 12.5000 mg | ORAL_TABLET | Freq: Two times a day (BID) | ORAL | Status: DC

## 2017-06-05 MED ORDER — FOLIC ACID 1 MG PO TABS: 1.0000 mg | ORAL_TABLET | Freq: Every day | ORAL | Status: DC

## 2017-06-05 MED ORDER — GI COCKTAIL ~~LOC~~
30.0000 mL | Freq: Three times a day (TID) | ORAL | Status: DC | PRN
  Filled 2017-06-05: qty 30

## 2017-06-05 NOTE — Progress Notes (Signed)
PT Cancellation Note  Patient Details Name: [name] MRN: [id number] DOB: 03-Oct-1932   Cancelled Treatment:    Reason Eval/Treat Not Completed: Patient at procedure or test/unavailable (nuclear medicine). Will follow-up for PT treatment.  Mabeline Caras, PT, DPT Acute Rehab Services  Pager: [place] 06/05/2017, 8:34 AM

## 2017-06-05 NOTE — Clinical Social Work Placement (Signed)
Nurse to call report to [phone], Room 124b    CLINICAL SOCIAL WORK PLACEMENT  NOTE  Date:  06/05/2017  Patient Details  Name: [name] MRN: [id number] Date of Birth: [birth year]  Clinical Social Work is seeking post-discharge placement for this patient at the [place] level of care (*CSW will initial, date and re-position this form in  chart as items are completed):  Yes   Patient/family provided with [place] Work Department's list of facilities offering this level of care within the geographic area requested by the patient (or if unable, by the patient's family).  Yes   Patient/family informed of their freedom to choose among providers that offer the needed level of care, that participate in Medicare, Medicaid or managed care program needed by the patient, have an available bed and are willing to accept the patient.  Yes   Patient/family informed of [facility]'s ownership interest in [facility] and [facility], as well as of the fact that they are under no obligation to receive care at these facilities.  PASRR submitted to EDS on       PASRR number received on       Existing PASRR number confirmed on 06/04/17     FL2 transmitted to all facilities in geographic area requested by pt/family on 06/04/17     FL2 transmitted to all facilities within larger geographic area on       Patient informed that his/her managed care company has contracts with or will negotiate with certain facilities, including the following:        Yes   Patient/family informed of bed offers received.  Patient chooses bed at [facility])     Physician recommends and patient chooses bed at      Patient to be transferred to [facility]) on 06/05/17.  Patient to be transferred to facility by [facility]     Patient family notified on 06/05/17 of transfer.  Name of family member notified:        PHYSICIAN       Additional Comment:     _______________________________________________ Geralynn Ochs, LCSW 06/05/2017, 2:31 PM

## 2017-06-05 NOTE — Discharge Summary (Signed)
[name], is a 84 y.o. female  DOB May 12, 1932  MRN [id number].  Admission date:  06/02/2017  Admitting Physician  Rise Patience, MD  Discharge Date:  06/05/2017   Primary MD  Dierdre Harness, FNP  Recommendations for primary care physician for things to follow:  -Check BMP in 3 days   Admission Diagnosis  Ischemic stroke [facility]) [I63.9]   Discharge Diagnosis  Ischemic stroke [facility]) [I63.9]    Principal Problem:   Chest pain Active Problems:   Chronic diastolic heart failure ([place])   Hypertensive urgency   Ischemic stroke ([place])   Type 2 diabetes mellitus with vascular disease ([place])   CKD (chronic kidney disease) stage 2, GFR 60-89 ml/min   Alcohol abuse      Past Medical History:  Diagnosis Date  . Anemia 04/03/2011  . Anginal pain ([place])   . Anxiety   . Arthritis    "feet" (06/02/2017)  . Blind left eye 1999  . Depression   . Exertional dyspnea   . GERD (gastroesophageal reflux disease)   . High cholesterol   . Hip fracture ([place]) 07/17/11   fall from 1-2 feet; left  . Hypertension   . Ischemic stroke ([place]) 06/02/2017   "weak moving around" (06/02/2017)  . Prosthetic eye globe 1999   "from diabetic; left eye"  . 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 Type II diabetes mellitus ([place])    not taking any medication (06/02/2017)    Past Surgical History:  Procedure Laterality Date  . ENUCLEATION Left   . FRACTURE SURGERY    . HIP ARTHROPLASTY  07/18/2011   Procedure: ARTHROPLASTY BIPOLAR HIP;  Surgeon: Nita Sells, MD;  Location: [place];  Service: Orthopedics;  Laterality: Left;  . INTRAOCULAR PROSTHESES INSERTION  1999   left  . TONGUE BIOPSY  1998   "cancer"  . TUBAL LIGATION  1970's       History of present illness and  Hospital Course:     Kindly see H&P for history of present illness and admission details, please review complete Labs, Consult  reports and Test reports for all details in brief  HPI  from the history and physical done on the day of admission    [name] is an 84 y.o. female withhistory of diastolic CHF, diabetes mellitus, hypertension, chronic kidney disease stage III, history of lung cancer status post radiation, history of alcohol abuse presents to the ER with complaint of chest pain. Patient states she has been having chest pain over the last 1 month with shortness of breath symptoms are mostly on exertion at times also happens on at rest. Patient also over the last 2 to 3 months has been having difficulty walking with balance issues. Patient had followed up with cardiologist Dr. Tamala Julian 3 weeks ago and at that time patient had complained of chest pain and palpitations. Patient had a Holter monitor done results of which are not known. Holter monitor was done to rule out paroxysmal  atrial fibrillation. Patient at that time was noticed to be not taking her blood pressure medications.    Chest pain with exertional shortness of breath -Patient had recent Holter monitor arranged by cardiology, V/Q scan was obtained during hospital stay which was low risk for PE, MI ruled out with 3- cardiac enzymes, went for nuclear stress test this a.m., which was overall low risk stress test. -Further work-up per primary cardiologist as an outpatient, appointment has been arranged for 06/22/2017.   Ataxia -MRI of the brain no acute events, but it did show new strokes once compared to MRI done in 2011, has been discussed by neurology with admitting physician, at this point no further work-up , M09 and folic acid is normal, , will be discharged to SNF for further PT/OT ,   Hypertension -She is apparently noncompliant with her home regimen, pressure was labile during hospital stay, Lisinopril and hydralazine has been stopped, Coreg has been decreased by half, to continue with Norvasc and Imdur.  AKI on  CKD stage III -Was hydrated during hospital stay, recheck BMP as an outpatient.  Pill has been held on discharge, creatinine was 1.5  Mildly elevated LFTs.  -Likely from alcoholism.  -monitor  UTI -Urine culture growing E. coli, treated with total of 3 days of IV Rocephin   Chronic diastolic CHF last EF measured in 2017 was 55 to 60%. - no current volume overload  Diabetes mellitus type 2 - well controlled with diet - hemoglobin A1c: 5.7 -Continue with insulin sliding scale on discharge  Alcohol use -He has intermittent alcohol use, no evidence of withdrawal during hospital stay, she will be discharged on thiamine and folic acid  GERD -on PPI  History of mouth cancer status post radiation therapy.  Hyperlipidemia  -on statin -watch LFTs        Discharge condition: -Stable  Follow UP   Contact information for follow-up providers    Kayla Serge, NP Follow up on 06/22/2017.   Specialties:  Cardiology, Radiology Why:  11 am Contact information: [address] [place] [address] [phone]            Contact information for after-discharge care    [facility] .   Service:  Skilled Nursing Contact information: [address] [place] [phone]                    Discharge Instructions  and  Discharge Medications    Discharge Instructions    Diet - low sodium heart healthy   Complete by:  As directed    Increase activity slowly   Complete by:  As directed      Allergies as of 06/05/2017   No Known Allergies     Medication List    STOP taking these medications   hydrALAZINE 100 MG tablet Commonly known as:  APRESOLINE   lisinopril 40 MG tablet Commonly known as:  PRINIVIL,ZESTRIL     TAKE these medications   acetaminophen 325 MG tablet Commonly known as:  TYLENOL Take 2 tablets (650 mg total) by mouth every 6 (six) hours  as needed for mild pain or fever (or temp > 37.5 C (99.5 F)).   ALPHAGAN P 0.1 % Soln Generic drug:  brimonidine Place 1 drop into the right eye 2 (two) times daily.   amLODipine 10 MG tablet Commonly known as:  NORVASC Take 10 mg by mouth daily.  aspirin 81 MG tablet Take 81 mg by mouth daily.   atorvastatin 20 MG tablet Commonly known as:  LIPITOR take 1 tablet by mouth once daily (CHANGING FROM SIMVASTATIN)   carvedilol 25 MG tablet Commonly known as:  COREG Take 0.5 tablets (12.5 mg total) by mouth 2 (two) times daily. What changed:  how much to take   folic acid 1 MG tablet Commonly known as:  FOLVITE Take 1 tablet (1 mg total) by mouth daily. Start taking on:  06/06/2017   furosemide 20 MG tablet Commonly known as:  LASIX take 1 tablet by mouth once daily   gabapentin 100 MG capsule Commonly known as:  NEURONTIN Take 100 mg by mouth 3 (three) times daily.   HYDROcodone-acetaminophen 5-325 MG tablet Commonly known as:  NORCO/VICODIN Take 1 tablet by mouth every 6 (six) hours as needed for moderate pain. Take 0.5 to 1 tablets by mouth every 6 hours as needed for pain. What changed:    how much to take  how to take this  when to take this  reasons to take this   insulin aspart 100 UNIT/ML injection Commonly known as:  novoLOG Inject 0-9 Units into the skin 3 (three) times daily with meals.   isosorbide mononitrate 30 MG 24 hr tablet Commonly known as:  IMDUR Take 30 mg by mouth daily.   multivitamin with minerals Tabs tablet Take 1 tablet by mouth daily. Start taking on:  06/06/2017   omeprazole 20 MG capsule Commonly known as:  PRILOSEC take 1 capsule by mouth once daily   thiamine 100 MG tablet Take 1 tablet (100 mg total) by mouth daily. Start taking on:  06/06/2017   TRAVATAN Z 0.004 % Soln ophthalmic solution Generic drug:  Travoprost (BAK Free) Place 1 drop into the right eye at bedtime.         Diet and Activity recommendation: See  Discharge Instructions above   Consults obtained -  cardiology   Major procedures and Radiology Reports - PLEASE review detailed and final reports for all details, in brief -     Dg Chest 2 View  Result Date: 06/02/2017 CLINICAL DATA:  Chest pain. EXAM: CHEST - 2 VIEW COMPARISON:  Radiographs of November 22, 2016. FINDINGS: The heart size and mediastinal contours are within normal limits. Both lungs are clear. Atherosclerosis of thoracic aorta is noted. No pneumothorax or pleural effusion is noted. The visualized skeletal structures are unremarkable. IMPRESSION: No active cardiopulmonary disease. Aortic Atherosclerosis (ICD10-I70.0). Electronically Signed   By: Marijo Conception, M.D.   On: 06/02/2017 11:57   Mr Brain Wo Contrast  Result Date: 06/02/2017 CLINICAL DATA:  84 y/o F; ataxia, stroke suspected. History of gum cancer status post radiation. EXAM: MRI HEAD WITHOUT CONTRAST TECHNIQUE: Multiplanar, multiecho pulse sequences of the brain and surrounding structures were obtained without intravenous contrast. COMPARISON:  10/12/2009 CT head.  06/06/2009 MRI head. FINDINGS: Brain: No acute infarction, hemorrhage, hydrocephalus, extra-axial collection or mass lesion. Small chronic infarctions are present within the bilateral cerebellar hemispheres and the left hemi pons, new from 2011. Mild progression of moderate chronic microvascular ischemic changes and parenchymal volume loss of the brain for age. Vascular: Normal flow voids. Skull and upper cervical spine: Hyperostosis frontalis interna. Sinuses/Orbits: Negative.  Left globe replacement. Other: None. IMPRESSION: 1. No acute intracranial abnormality identified. 2. Mild progression of chronic microvascular ischemic changes and parenchymal volume loss of the brain from 2011. 3. Multiple new small chronic infarctions within the bilateral cerebellar hemispheres  and left hemi pons. Electronically Signed   By: Kristine Garbe M.D.   On:  06/02/2017 19:05   Nm Myocar Multi W/spect W/wall Motion / Ef  Result Date: 06/05/2017  There was no ST segment deviation noted during stress.  Findings consistent with ischemia.  This is a low risk study.  The left ventricular ejection fraction is mildly decreased (45-54%).  Small area of inferior basal ischemia SDS 7 EF normal 54%   Nm Pulmonary Perf And Vent  Result Date: 06/03/2017 CLINICAL DATA:  Shortness of breath and chest pain EXAM: NUCLEAR MEDICINE VENTILATION - PERFUSION LUNG SCAN VIEWS: Anterior, posterior, left lateral, right lateral, RPO, LPO, RAO, LAO-ventilation and perfusion RADIOPHARMACEUTICALS:  32.6 mCi of Tc-26m DTPA aerosol inhalation and 4.3 mCi Tc49m-MAA IV COMPARISON:  Chest radiograph Jun 02, 2017 FINDINGS: Ventilation: Radiotracer uptake on the ventilation study is homogeneous and symmetric bilaterally. No ventilation defects are evident. Perfusion: Radiotracer uptake on the perfusion study is homogeneous and symmetric bilaterally. No perfusion defects are evident. IMPRESSION: No appreciable ventilation or perfusion defects. This study constitutes a very low probability of pulmonary embolus. Electronically Signed   By: Lowella Grip III M.D.   On: 06/03/2017 12:11    Micro Results     Recent Results (from the past 240 hour(s))  Culture, Urine     Status: Abnormal   Collection Time: 06/03/17  8:08 AM  Result Value Ref Range Status   Specimen Description URINE, CLEAN CATCH  Final   Special Requests   Final    NONE Performed at [facility], [address] [place], [place] [address]    Culture >=100,000 COLONIES/mL ESCHERICHIA COLI (A)  Final   Report Status 06/05/2017 FINAL  Final   Organism ID, Bacteria ESCHERICHIA COLI (A)  Final      Susceptibility   Escherichia coli - MIC*    AMPICILLIN <=2 SENSITIVE Sensitive     CEFAZOLIN <=4 SENSITIVE Sensitive     CEFTRIAXONE <=1 SENSITIVE Sensitive     CIPROFLOXACIN <=0.25 SENSITIVE Sensitive      GENTAMICIN <=1 SENSITIVE Sensitive     IMIPENEM <=0.25 SENSITIVE Sensitive     NITROFURANTOIN <=16 SENSITIVE Sensitive     TRIMETH/SULFA <=20 SENSITIVE Sensitive     AMPICILLIN/SULBACTAM <=2 SENSITIVE Sensitive     PIP/TAZO <=4 SENSITIVE Sensitive     Extended ESBL NEGATIVE Sensitive     * >=100,000 COLONIES/mL ESCHERICHIA COLI       Today   Subjective:   [name] today has no headache,no chest abdominal pain,no new weakness tingling or numbness, feels better today.   Objective:   Blood pressure (!) 173/80, pulse 82, temperature 97.6 F (36.4 C), resp. rate 16, height 5' (1.524 m), weight 61.1 kg (134 lb 11.2 oz), SpO2 93 %.   Intake/Output Summary (Last 24 hours) at 06/05/2017 1501 Last data filed at 06/05/2017 0550 Gross per 24 hour  Intake 1763.75 ml  Output -  Net 1763.75 ml    Exam Awake Alert, Oriented x 3, No new F.N deficits, Normal affect [place].AT,PERRAL Supple Neck,No JVD, No cervical lymphadenopathy appriciated.  Symmetrical Chest wall movement, Good air movement bilaterally, CTAB RRR,No Gallops,Rubs or new Murmurs, No Parasternal Heave +ve B.Sounds, Abd Soft, Non tender,  No rebound -guarding or rigidity. No Cyanosis, Clubbing or edema, No new Rash or bruise  Data Review   CBC w Diff:  Lab Results  Component Value Date   WBC 5.1 06/03/2017   HGB 13.7 06/03/2017   HGB  12.5 03/27/2011   HCT 40.8 06/03/2017   HCT 37.5 03/27/2011   PLT 233 06/03/2017   PLT 203 03/27/2011   LYMPHOPCT 24 11/22/2016   LYMPHOPCT 34.9 03/27/2011   MONOPCT 7 11/22/2016   MONOPCT 6.2 03/27/2011   EOSPCT 3 11/22/2016   EOSPCT 2.8 03/27/2011   BASOPCT 1 11/22/2016   BASOPCT 0.5 03/27/2011    CMP:  Lab Results  Component Value Date   NA 138 06/04/2017   NA 146 (H) 09/06/2008   K 4.4 06/04/2017   K 3.8 09/06/2008   CL 106 06/04/2017   CL 108 09/06/2008   CO2 21 (L) 06/04/2017   CO2 28 09/06/2008   BUN 21 (H) 06/04/2017   BUN 15 09/06/2008   CREATININE 1.56  (H) 06/04/2017   CREATININE 1.13 (H) 10/12/2015   PROT 7.2 06/03/2017   PROT 7.7 06/03/2006   ALBUMIN 3.2 (L) 06/03/2017   ALBUMIN 3.5 06/03/2006   BILITOT 0.7 06/03/2017   BILITOT 0.60 06/03/2006   ALKPHOS 70 06/03/2017   ALKPHOS 77 06/03/2006   AST 63 (H) 06/03/2017   AST 69 (H) 06/03/2006   ALT 52 06/03/2017   ALT 51 (H) 06/03/2006  .   Total Time in preparing paper work, data evaluation and todays exam - 90 minutes  Phillips Climes M.D on 06/05/2017 at 3:01 PM  [facility]   Office  [phone]

## 2017-06-05 NOTE — Progress Notes (Addendum)
Progress Note  Patient Name: [name] Date of Encounter: 06/05/2017  Primary Cardiologist: Belva Crome III, MD   Subjective   No chest pain, feels anxious about the myoview.   Inpatient Medications    Scheduled Meds: . aspirin  300 mg Rectal Daily   Or  . aspirin  325 mg Oral Daily  . atorvastatin  20 mg Oral q1800  . brimonidine  1 drop Right Eye BID  . enoxaparin (LOVENOX) injection  30 mg Subcutaneous Daily  . folic acid  1 mg Oral Daily  . gabapentin  100 mg Oral TID  . insulin aspart  0-9 Units Subcutaneous TID WC  . isosorbide mononitrate  30 mg Oral Daily  . latanoprost  1 drop Right Eye QHS  . multivitamin with minerals  1 tablet Oral Daily  . pantoprazole  80 mg Oral Daily  . regadenoson      . thiamine  100 mg Oral Daily   Or  . thiamine  100 mg Intravenous Daily   Continuous Infusions: . sodium chloride 75 mL/hr at 06/04/17 2109  . cefTRIAXone (ROCEPHIN)  IV Stopped (06/04/17 0948)   PRN Meds: acetaminophen **OR** acetaminophen (TYLENOL) oral liquid 160 mg/5 mL **OR** acetaminophen, hydrALAZINE, LORazepam **OR** LORazepam, nitroGLYCERIN, sodium chloride flush   Vital Signs    Vitals:   06/05/17 0001 06/05/17 0458 06/05/17 0500 06/05/17 0739  BP: 135/77 (!) 161/72 (!) 148/60 (!) 156/72  Pulse: 79 66  68  Resp: 16 18  18   Temp: 98.2 F (36.8 C)   (!) 97.5 F (36.4 C)  TempSrc: Oral   Oral  SpO2: 91% 100%  98%  Weight:      Height:        Intake/Output Summary (Last 24 hours) at 06/05/2017 0956 Last data filed at 06/05/2017 0550 Gross per 24 hour  Intake 2003.75 ml  Output -  Net 2003.75 ml   Filed Weights   06/02/17 1126 06/02/17 2140  Weight: 130 lb (59 kg) 134 lb 11.2 oz (61.1 kg)    Telemetry    SR, seen in nuc med- Personally Reviewed  ECG    No new tracings - Personally Reviewed  Physical Exam   GEN: No acute distress.   Neck: JVD 9 cm Cardiac: slightly irregular rhythm, 2/6 SEM Respiratory: coarse sounds in  bases, respirations unlabored GI: Soft, nontender, non-distended  MS: No edema; No deformity. Neuro:  Nonfocal  Psych: Normal affect   Labs    Chemistry Recent Labs  Lab 06/02/17 1128 06/02/17 1957 06/03/17 0505 06/04/17 1025  NA 139  --  144 138  K 4.1  --  3.4* 4.4  CL 109  --  108 106  CO2 17*  --  25 21*  GLUCOSE 175*  --  84 83  BUN 15  --  9 21*  CREATININE 1.15*  --  0.91 1.56*  CALCIUM 9.1  --  9.2 9.2  PROT  --  8.1 7.2  --   ALBUMIN  --  3.8 3.2*  --   AST  --  75* 63*  --   ALT  --  61* 52  --   ALKPHOS  --  85 70  --   BILITOT  --  0.9 0.7  --   GFRNONAA 43*  --  57* 30*  GFRAA 50*  --  >60 34*  ANIONGAP 13  --  11 11     Hematology Recent Labs  Lab 06/02/17 1807 06/03/17  0505 04-Jun-2017 0822  WBC 5.9 5.1  --   RBC 5.05 4.71  --   HGB 14.7 13.7  --   HCT 44.4 41.3 40.8  MCV 87.9 87.7  --   MCH 29.1 29.1  --   MCHC 33.1 33.2  --   RDW 14.4 14.3  --   PLT 254 233  --     Cardiac Enzymes Recent Labs  Lab 06/02/17 2326 04-Jun-2017 0505 06/04/2017 1140  TROPONINI <0.03 <0.03 <0.03    Recent Labs  Lab 06/02/17 1130  TROPIPOC 0.01     DDimer  Recent Labs  Lab 06/02/17 2326  DDIMER 2.11*     Radiology    Nm Pulmonary Perf And Vent  Result Date: Jun 04, 2017 CLINICAL DATA:  Shortness of breath and chest pain EXAM: NUCLEAR MEDICINE VENTILATION - PERFUSION LUNG SCAN VIEWS: Anterior, posterior, left lateral, right lateral, RPO, LPO, RAO, LAO-ventilation and perfusion RADIOPHARMACEUTICALS:  32.6 mCi of Tc-6m DTPA aerosol inhalation and 4.3 mCi Tc24m-MAA IV COMPARISON:  Chest radiograph Jun 02, 2017 FINDINGS: Ventilation: Radiotracer uptake on the ventilation study is homogeneous and symmetric bilaterally. No ventilation defects are evident. Perfusion: Radiotracer uptake on the perfusion study is homogeneous and symmetric bilaterally. No perfusion defects are evident. IMPRESSION: No appreciable ventilation or perfusion defects. This study constitutes  a very low probability of pulmonary embolus. Electronically Signed   By: Lowella Grip III M.D.   On: 04-Jun-2017 12:11    Cardiac Studies   Myoview pending  Echo: 06-04-17 - Left ventricle: The cavity size was normal. There was moderate   concentric hypertrophy. Systolic function was vigorous. The   estimated ejection fraction was in the range of 65% to 70%. Wall   motion was normal; there were no regional wall motion   abnormalities. Doppler parameters are consistent with abnormal   left ventricular relaxation (grade 1 diastolic dysfunction).   Doppler parameters are consistent with elevated ventricular   end-diastolic filling pressure. - Aortic valve: There was mild stenosis. There was mild   regurgitation. Valve area (VTI): 1.69 cm^2. Valve area (Vmax):   1.56 cm^2. Valve area (Vmean): 1.63 cm^2.     Mean gradient (S): 4 mm Hg. Peak gradient (S): 8 mm Hg. - Mitral valve: Calcified annulus. Mildly thickened leaflets .   Valve area by pressure half-time: 1.11 cm^2. - Left atrium: The atrium was mildly dilated. - Right ventricle: The cavity size was normal. Wall thickness was   normal. Systolic function was normal. - Right atrium: The atrium was normal in size. - Tricuspid valve: There was mild regurgitation. - Pulmonary arteries: Systolic pressure was within the normal   range. - Inferior vena cava: The vessel was normal in size. - Pericardium, extracardiac: There was no pericardial effusion.   Patient Profile     84 y.o. female with a PMH of chronic diastolic CHF, HTN, DM type 2, CKD stage III, history of lung cancer s/p radiation, and alcohol abuse. She was admitted 05/21 with chest pain.  Assessment & Plan    1. Chest pain - plan for NST today - d-dimer elevated but VQ negative - EF nl on echo w/ no WMA  2. Chronic diastolic heart failure - echo w/ nl PAS - does not appear fluid overloaded on exam but has JVD - mild AS by echo.  3. HTN - BP elevated at  times - BP 213/85 after up to BR, but pt has not had am rx yet.   4. Ischemic stroke seen on  MRI compared to 2011 - per primary team, no stroke workup planned  5. DM - per primary team  6. Hypokalemia - improved w/ rx but Cr higher today - mgt per IM    For questions or updates, please contact [place] Please consult [email or web address] for contact info under Cardiology/STEMI.      Signed, Rosaria Ferries, PA-C  06/05/2017, 9:56 AM    Agree with note by Rosaria Ferries PA-C  Low risk MV. Enz neg. Sx atyp. OK for DC home. No further w/u indicated at this time.  Lorretta Harp, M.D., [place], [facility], [place] [facility] [address] [place], [place]  [address]  [phone] 06/05/2017 3:06 PM

## 2017-06-05 NOTE — Telephone Encounter (Signed)
Rx faxed to [facility] (P) [phone], (F) [phone] 

## 2017-06-05 NOTE — Discharge Instructions (Signed)
Follow with Primary MD Dierdre Harness, FNP or SNF physician in 4 to 5 days  Get CBC, CMP, by Primary MD next visit.    Activity: As tolerated with Full fall precautions use walker/cane & assistance as needed   Disposition SNF   Diet: Heart Healthy, carb modified, with feeding assistance and aspiration precautions.  For Heart failure patients - Check your Weight same time everyday, if you gain over 2 pounds, or you develop in leg swelling, experience more shortness of breath or chest pain, call your Primary MD immediately. Follow Cardiac Low Salt Diet and 1.5 lit/day fluid restriction.   On your next visit with your primary care physician please Get Medicines reviewed and adjusted.   Please request your Prim.MD to go over all Hospital Tests and Procedure/Radiological results at the follow up, please get all Hospital records sent to your Prim MD by signing hospital release before you go home.   If you experience worsening of your admission symptoms, develop shortness of breath, life threatening emergency, suicidal or homicidal thoughts you must seek medical attention immediately by calling 911 or calling your MD immediately  if symptoms less severe.  You Must read complete instructions/literature along with all the possible adverse reactions/side effects for all the Medicines you take and that have been prescribed to you. Take any new Medicines after you have completely understood and accpet all the possible adverse reactions/side effects.   Do not drive, operating heavy machinery, perform activities at heights, swimming or participation in water activities or provide baby sitting services if your were admitted for syncope or siezures until you have seen by Primary MD or a Neurologist and advised to do so again.  Do not drive when taking Pain medications.    Do not take more than prescribed Pain, Sleep and Anxiety Medications  Special Instructions: If you have smoked or chewed  Tobacco  in the last 2 yrs please stop smoking, stop any regular Alcohol  and or any Recreational drug use.  Wear Seat belts while driving.   Please note  You were cared for by a hospitalist during your hospital stay. If you have any questions about your discharge medications or the care you received while you were in the hospital after you are discharged, you can call the unit and asked to speak with the hospitalist on call if the hospitalist that took care of you is not available. Once you are discharged, your primary care physician will handle any further medical issues. Please note that NO REFILLS for any discharge medications will be authorized once you are discharged, as it is imperative that you return to your primary care physician (or establish a relationship with a primary care physician if you do not have one) for your aftercare needs so that they can reassess your need for medications and monitor your lab values.

## 2017-06-05 NOTE — Progress Notes (Addendum)
Myoview reviewed with Dr Gwenlyn Found- low risk. OK for discharge. Follow up arranged.  Kerin Ransom PA-C 06/05/2017 2:05 PM   Agree with note written by Kerin Ransom [facility]  MV low risk. OK for DC Quay Burow 06/05/2017 3:08 PM

## 2017-06-05 NOTE — Plan of Care (Signed)
Adequate for discharge.

## 2017-06-05 NOTE — Progress Notes (Signed)
Physical Therapy Treatment Patient Details Name: [name] MRN: [id number] DOB: March 09, 1932 Today's Date: 06/05/2017    History of Present Illness Pt is an 84 y.o. female admitted 06/02/17 with c/o chest pain and SOB; also c/o 2-3 months of difficulty walking with balance issues. MRI shows new strokes in cerebellar and pons compared to imaging in 2011; neurology advised no stroke work-up. PMH includes HTN, CHF, CKD III, DM, alcohol use.   PT Comments    Pt progressing with mobility. Amb in room with no DME, but reliant on intermittent UE support on furniture and close min guard for balance; amb with RW and intermittent min guard. Pt c/o bilat knee instability, although no buckling noted. Completed seated LE therex. Pt very motivated to participate. Continue to recommend SNF-level therapies.   Follow Up Recommendations  SNF;Supervision for mobility/OOB     Equipment Recommendations  (TBD)    Recommendations for Other Services       Precautions / Restrictions Precautions Precautions: Fall Restrictions Weight Bearing Restrictions: No    Mobility  Bed Mobility               General bed mobility comments: Received sitting in recliner  Transfers Overall transfer level: Needs assistance Equipment used: None Transfers: Sit to/from Stand Sit to Stand: Min guard         General transfer comment: Pt standing from recliner and toilet with no DME reaching for furniture/rail for UE support; min guard for balance  Ambulation/Gait Ambulation/Gait assistance: Min guard Ambulation Distance (Feet): (hallway) Assistive device: Rolling walker (2 wheeled) Gait Pattern/deviations: Step-through pattern;Decreased stride length;Drifts right/left Gait velocity: Decreased Gait velocity interpretation: <1.31 ft/sec, indicative of household ambulator General Gait Details: Close min guard for balance; pt c/o bilat knee instability, but no buckling noted. Increased time with  turns   Marine scientist [place] (Stroke Patients Only)       Balance Overall balance assessment: Needs assistance   Sitting balance-Leahy Scale: Good       Standing balance-Leahy Scale: Fair Standing balance comment: Can static stand with no UE support, cannot accept challenge; dynamic stability improved with BUE support                            Cognition Arousal/Alertness: Awake/alert Behavior During Therapy: WFL for tasks assessed/performed Overall Cognitive Status: No family/caregiver present to determine baseline cognitive functioning Area of Impairment: Attention;Safety/judgement                   Current Attention Level: Selective     Safety/Judgement: Decreased awareness of safety;Decreased awareness of deficits            Exercises General Exercises - Lower Extremity Long Arc Quad: AROM;Both;Other reps (comment);Seated(30) Hip Flexion/Marching: AROM;Both;Seated;20 reps    General Comments        Pertinent Vitals/Pain Pain Assessment: No/denies pain    Home Living                      Prior Function            PT Goals (current goals can now be found in the care plan section) Acute Rehab PT Goals Patient Stated Goal: get stronger and go back home PT Goal Formulation: With patient Time For Goal Achievement: 06/17/17 Potential to Achieve Goals: Good Progress towards PT goals: Progressing toward goals  Frequency    Min 2X/week      PT Plan Current plan remains appropriate    Co-evaluation              AM-PAC PT "6 Clicks" Daily Activity  Outcome Measure  Difficulty turning over in bed (including adjusting bedclothes, sheets and blankets)?: A Little Difficulty moving from lying on back to sitting on the side of the bed? : A Little Difficulty sitting down on and standing up from a chair with arms (e.g., wheelchair, bedside commode, etc,.)?: A Little Help  needed moving to and from a bed to chair (including a wheelchair)?: A Little Help needed walking in hospital room?: A Little Help needed climbing 3-5 steps with a railing? : A Lot 6 Click Score: 17    End of Session Equipment Utilized During Treatment: Gait belt Activity Tolerance: Patient tolerated treatment well Patient left: in chair;with call bell/phone within reach Nurse Communication: Mobility status PT Visit Diagnosis: Other abnormalities of gait and mobility (R26.89);Repeated falls (R29.6)     Time: 7414-2395 PT Time Calculation (min) (ACUTE ONLY): 18 min  Charges:  $Gait Training: 8-22 mins                    G Codes:      Mabeline Caras, PT, DPT Acute Rehab Services  Pager: [place] 06/05/2017, 2:53 PM

## 2017-06-05 NOTE — Care Management Note (Signed)
Case Management Note  Patient Details  Name: [name] MRN: [id number] Date of Birth: 1932-03-19  Subjective/Objective:                    Action/Plan: Pt discharging to [facility] today. CM signing off.    Expected Discharge Date:  06/05/17               Expected Discharge Plan:  Skilled Nursing Facility  In-House Referral:  Clinical Social Work  Discharge planning Services     Post Acute Care Choice:    Choice offered to:     DME Arranged:    DME Agency:     HH Arranged:    [place] Agency:     Status of Service:  Completed, signed off  If discussed at [employer], dates discussed:    Additional Comments:  Pollie Friar, RN 06/05/2017, 3:00 PM

## 2017-06-05 NOTE — Progress Notes (Signed)
   [name] presented for a nuclear stress test today.  No immediate complications.  Stress imaging is pending at this time.  Preliminary EKG findings may be listed in the chart, but the stress test result will not be finalized until perfusion imaging is complete.  1 day study, [facility] to read.  Rosaria Ferries, PA-C 06/05/2017, 10:14 AM

## 2017-06-09 ENCOUNTER — Non-Acute Institutional Stay (SKILLED_NURSING_FACILITY): Payer: Medicare Other | Admitting: Adult Health

## 2017-06-09 ENCOUNTER — Encounter: Payer: Self-pay | Admitting: Adult Health

## 2017-06-09 DIAGNOSIS — K219 Gastro-esophageal reflux disease without esophagitis: Secondary | ICD-10-CM

## 2017-06-09 DIAGNOSIS — I5032 Chronic diastolic (congestive) heart failure: Secondary | ICD-10-CM

## 2017-06-09 DIAGNOSIS — F1010 Alcohol abuse, uncomplicated: Secondary | ICD-10-CM | POA: Diagnosis not present

## 2017-06-09 DIAGNOSIS — H42 Glaucoma in diseases classified elsewhere: Secondary | ICD-10-CM

## 2017-06-09 DIAGNOSIS — I129 Hypertensive chronic kidney disease with stage 1 through stage 4 chronic kidney disease, or unspecified chronic kidney disease: Secondary | ICD-10-CM

## 2017-06-09 DIAGNOSIS — E1169 Type 2 diabetes mellitus with other specified complication: Secondary | ICD-10-CM | POA: Insufficient documentation

## 2017-06-09 DIAGNOSIS — E1139 Type 2 diabetes mellitus with other diabetic ophthalmic complication: Secondary | ICD-10-CM

## 2017-06-09 DIAGNOSIS — N183 Chronic kidney disease, stage 3 unspecified: Secondary | ICD-10-CM | POA: Insufficient documentation

## 2017-06-09 DIAGNOSIS — E785 Hyperlipidemia, unspecified: Secondary | ICD-10-CM

## 2017-06-09 DIAGNOSIS — E1142 Type 2 diabetes mellitus with diabetic polyneuropathy: Secondary | ICD-10-CM

## 2017-06-09 DIAGNOSIS — E1122 Type 2 diabetes mellitus with diabetic chronic kidney disease: Secondary | ICD-10-CM | POA: Insufficient documentation

## 2017-06-09 NOTE — Progress Notes (Signed)
Location:   [facility] Room Number: 124 Place of Service:  SNF (31)   CODE STATUS: DNR  No Known Allergies  Chief Complaint  Patient presents with  . Hospitalization Follow-up    Hospital Follow up    HPI:  She is an 84 year old woman who has been hospitalized from 06-02-17 through 06-05-17. She has history of diastolic heart failure; diabetes; hypertension; history of lung cancer status post radiation therapy and alcoholism. She had been having chest pain for one month prior to her hospitalization with increased shortness of breath with exertion. She had been having increased difficulty with ambulation and balance for the past 2 months. She did have a holter monitor on an outpatient basis without known results.  PE/MI ruled out. Her MRI did not show acute events but does show new strokes. She is here for short term rehab with her goal to return back home  She will continue to be followed for her chronic illnesses including: heart failure; hypertension; diabetes. She denies any chest pain; no shortness of breath no anxiety. There are no nursing concerns at this time.   Past Medical History:  Diagnosis Date  . Anemia 04/03/2011  . Anginal pain ([place])   . Anxiety   . Arthritis    "feet" (06/02/2017)  . Blind left eye 1999  . Depression   . Exertional dyspnea   . GERD (gastroesophageal reflux disease)   . High cholesterol   . Hip fracture ([place]) 07/17/11   fall from 1-2 feet; left  . Hypertension   . Ischemic stroke ([place]) 06/02/2017   "weak moving around" (06/02/2017)  . Prosthetic eye globe 1999   "from diabetic; left eye"  . Tongue cancer ([place]) 1998   S/P radiation  . Type II diabetes mellitus ([place])    not taking any medication (06/02/2017)    Past Surgical History:  Procedure Laterality Date  . ENUCLEATION Left   . FRACTURE SURGERY    . HIP ARTHROPLASTY  07/18/2011   Procedure: ARTHROPLASTY BIPOLAR HIP;  Surgeon: Nita Sells, MD;  Location: [facility];   Service: Orthopedics;  Laterality: Left;  . INTRAOCULAR PROSTHESES INSERTION  1999   left  . TONGUE BIOPSY  1998   "cancer"  . TUBAL LIGATION  1970's    Social History   Socioeconomic History  . Marital status: Single    Spouse name: Not on file  . Number of children: Not on file  . Years of education: Not on file  . Highest education level: Not on file  Occupational History  . Not on file  Social Needs  . Financial resource strain: Not on file  . Food insecurity:    Worry: Not on file    Inability: Not on file  . Transportation needs:    Medical: Not on file    Non-medical: Not on file  Tobacco Use  . Smoking status: Former Smoker    Packs/day: 1.50    Years: 47.00    Pack years: 70.50    Types: Cigarettes    Last attempt to quit: 09/13/1997    Years since quitting: 19.7  . Smokeless tobacco: Never Used  Substance and Sexual Activity  . Alcohol use: Yes    Alcohol/week: 10.2 oz    Types: 17 Shots of liquor per week    Comment: 06/02/2017 "1/5 rum q weekend"  . Drug use: No    Types: Marijuana, Heroin    Comment: 06/02/2017  "last drug use was  in the 1980s"  . Sexual activity: Not Currently  Lifestyle  . Physical activity:    Days per week: Not on file    Minutes per session: Not on file  . Stress: Not on file  Relationships  . Social connections:    Talks on phone: Not on file    Gets together: Not on file    Attends religious service: Not on file    Active member of club or organization: Not on file    Attends meetings of clubs or organizations: Not on file    Relationship status: Not on file  . Intimate partner violence:    Fear of current or ex partner: Not on file    Emotionally abused: Not on file    Physically abused: Not on file    Forced sexual activity: Not on file  Other Topics Concern  . Not on file  Social History Narrative  . Not on file   Family History  Problem Relation Age of Onset  . Diabetes type II Mother   . Diabetes type II  Father   . Diabetes type II Sister   . Colon cancer Neg Hx       VITAL SIGNS BP (!) 130/54   Pulse 66   Temp 97.6 F (36.4 C)   Resp 20   Ht 5' (1.524 m)   Wt 135 lb (61.2 kg)   SpO2 99%   BMI 26.37 kg/m   Outpatient Encounter Medications as of 06/09/2017  Medication Sig  . acetaminophen (TYLENOL) 325 MG tablet Take 2 tablets (650 mg total) by mouth every 6 (six) hours as needed for mild pain or fever (or temp > 37.5 C (99.5 F)).  . ALPHAGAN P 0.1 % SOLN Place 1 drop into the right eye 2 (two) times daily.  Marland Kitchen amLODipine (NORVASC) 10 MG tablet Take 10 mg by mouth daily.  Marland Kitchen aspirin 81 MG tablet Take 81 mg by mouth daily.  Marland Kitchen atorvastatin (LIPITOR) 20 MG tablet take 1 tablet by mouth once daily (CHANGING FROM SIMVASTATIN)  . carvedilol (COREG) 25 MG tablet Take 0.5 tablets (12.5 mg total) by mouth 2 (two) times daily.  . folic acid (FOLVITE) 1 MG tablet Take 1 tablet (1 mg total) by mouth daily.  . furosemide (LASIX) 20 MG tablet take 1 tablet by mouth once daily  . gabapentin (NEURONTIN) 100 MG capsule Take 100 mg by mouth 3 (three) times daily.  Marland Kitchen HYDROcodone-acetaminophen (NORCO/VICODIN) 5-325 MG tablet Take 1/2 to 1 tablet every 6 hours as needed  . insulin aspart (NOVOLOG FLEXPEN) 100 UNIT/ML FlexPen Inject 3 units subcutaneously after meals for DM.  Give 3 units for CBG 150 or greater  . isosorbide mononitrate (IMDUR) 30 MG 24 hr tablet Take 30 mg by mouth daily.  . Multiple Vitamin (MULTIVITAMIN WITH MINERALS) TABS tablet Take 1 tablet by mouth daily.  Marland Kitchen omeprazole (PRILOSEC) 20 MG capsule take 1 capsule by mouth once daily  . thiamine 100 MG tablet Take 1 tablet (100 mg total) by mouth daily.  . TRAVATAN Z 0.004 % SOLN ophthalmic solution Place 1 drop into the right eye at bedtime.   . [DISCONTINUED] insulin aspart (NOVOLOG) 100 UNIT/ML injection Inject 0-9 Units into the skin 3 (three) times daily with meals. (Patient not taking: Reported on 06/09/2017)   No  facility-administered encounter medications on file as of 06/09/2017.      SIGNIFICANT DIAGNOSTIC EXAMS  TODAY:   06-02-17: chest x-ray: No active cardiopulmonary disease. Aortic  Atherosclerosis   06-02-17: mri of brain: 1. No acute intracranial abnormality identified. 2. Mild progression of chronic microvascular ischemic changes and parenchymal volume loss of the brain from 2011. 3. Multiple new small chronic infarctions within the bilateral cerebellar hemispheres and left hemi pons.  06-03-17: NM pulmonary perfusion: No appreciable ventilation or perfusion defects. This study constitutes a very low probability of pulmonary embolus.   06-03-17: 2-d echo:   - Left ventricle: The cavity size was normal. There was moderate concentric hypertrophy. Systolic function was vigorous. The estimated ejection fraction was in the range of 65% to 70%. Wall motion was normal; there were no regional wall motion abnormalities. Doppler parameters are consistent with abnormal left ventricular relaxation (grade 1 diastolic dysfunction). Doppler parameters are consistent with elevated ventricular end-diastolic filling pressure. - Aortic valve: There was mild stenosis. There was mild regurgitation.  - Mitral valve: Calcified annulus. Mildly thickened leaflets . - Left atrium: The atrium was mildly dilated. - Right ventricle: The cavity size was normal. Wall thickness was normal. Systolic function was normal. - Right atrium: The atrium was normal in size. - Tricuspid valve: There was mild regurgitation. - Pulmonary arteries: Systolic pressure was within the normal range. - Inferior vena cava: The vessel was normal in size. - Pericardium, extracardiac: There was no pericardial effusion  06-05-17: myoview: There was no ST segment deviation noted during stress. Findings consistent with ischemia This is a low risk study The left ventricular ejection fraction is mildly decreased (45-54%). Small area of inferior basal  ischemia SDS 7 EF normal 54%  LABS REVIEWED TODAY:   06-02-17: wbc 5.9; hgb 14.7; hct 44.4; mcv 87.9; plt 254; glucose 175; bun 15; creat 1.15; k+ 4.1; na++ 139; ca 9.1; ast 75; alt 61; albumin 3.8; d-dimer: 2.11 06-03-17: wbc 5.1; hgb 13.7; hct 41.3; mcv 87.7; plt 233; glucose 84; bun 9; creat 0.91; k+ 3.4; na++ 144; ca 9.2; ast 63; alt 52; albumin 3.2; chol 154; ldl 76; trig 111; hdl 56; hgb a1c  5.6  urine culture: e-coli     Review of Systems  Constitutional: Negative for malaise/fatigue.  Respiratory: Negative for cough and shortness of breath.   Cardiovascular: Negative for chest pain, palpitations and leg swelling.  Gastrointestinal: Negative for abdominal pain, constipation and heartburn.  Musculoskeletal: Negative for back pain, joint pain and myalgias.  Skin: Negative.   Neurological: Negative for dizziness.  Psychiatric/Behavioral: The patient is not nervous/anxious.      Physical Exam  Constitutional: She is oriented to person, place, and time. She appears well-developed and well-nourished. No distress.  Eyes:  Blind left eye has prosthetic eye globe   Neck: Thyromegaly present.  Thyroid gland enlarged overall  Cardiovascular: Normal rate, regular rhythm and intact distal pulses.  Murmur heard. 2/6  Pulmonary/Chest: Effort normal and breath sounds normal. No respiratory distress.  Abdominal: Soft. Bowel sounds are normal. She exhibits no distension. There is no tenderness.  Musculoskeletal: Normal range of motion. She exhibits no edema.  Lymphadenopathy:    She has no cervical adenopathy.  Neurological: She is alert and oriented to person, place, and time.  Speech is slightly hestitant   Skin: Skin is warm and dry. She is not diaphoretic.  Psychiatric: She has a normal mood and affect.     ASSESSMENT/ PLAN:  TODAY:  1. Chronic diastolic heart failure: stable EF 65-70% (06-03-17): will continue lasix 20 mg daily and imdur 30 mg daily coreg 25 mg twice daily   2.  Hypertension associated with  stage 3 chronic kidney disease due to type 2 diabetes mellitus: stable b/p 130/54: will continue norvasc 10 mg daily coreg 25 mg twice daily   3. Ischemic stroke: neurologically stable: will continue asa 81 mg daily   4. Type 2 diabetes mellitus with vascular disease: stable hgb a1c 5.6; will stop novolog and will check cbg twice weekly   5. Dyslipidemia associated with type 2 diabetes mellitus: stable ldl 76 will continue lipitor 20 mg daily   6. Stage 3 chronic kidney disease due to type 2 diabetes mellitus: stable bun 9 ;creat 0.91  7. Peripheral sensory neuropathy due to type 2 diabetes mellitus: stable will continue neurontin 100 mg three times daily has vicodin 5/325 mg tabs 1/2 or 1 tab every 6 hours as needed    8. GERD without esophagitis: stable will continue prilosec 20 mg daily   9. Glaucoma due to type 2 diabetes mellitus: right eye: is blind in left eye: stable will continue alphagan to right eye twice daily and travatan Z to right eye nightly   10. Alcohol abuse: stable will continue folic acid and thiamine   Will check bmp; tsh       MD is aware of resident's narcotic use and is in agreement with current plan of care. We will attempt to wean resident as apropriate   Ok Edwards NP [facility]  Contact [phone] Monday through Friday 8am- 5pm  After hours call [phone]

## 2017-06-10 ENCOUNTER — Encounter: Payer: Self-pay | Admitting: Adult Health

## 2017-06-10 ENCOUNTER — Non-Acute Institutional Stay (SKILLED_NURSING_FACILITY): Payer: Medicare Other | Admitting: Adult Health

## 2017-06-10 DIAGNOSIS — E1142 Type 2 diabetes mellitus with diabetic polyneuropathy: Secondary | ICD-10-CM | POA: Diagnosis not present

## 2017-06-10 DIAGNOSIS — I639 Cerebral infarction, unspecified: Secondary | ICD-10-CM

## 2017-06-10 DIAGNOSIS — I5032 Chronic diastolic (congestive) heart failure: Secondary | ICD-10-CM | POA: Diagnosis not present

## 2017-06-10 LAB — TSH: TSH: 2.4 (ref 0.41–5.90)

## 2017-06-10 LAB — BASIC METABOLIC PANEL
BUN: 22 — AB (ref 4–21)
Creatinine: 1.3 — AB (ref 0.5–1.1)
Glucose: 181
Potassium: 4.2 (ref 3.4–5.3)
Sodium: 143 (ref 137–147)

## 2017-06-10 NOTE — Progress Notes (Signed)
Location:   [facility] Room Number: 124 Place of Service:  SNF ([id number])   CODE STATUS: DNR  No Known Allergies  Chief Complaint  Patient presents with  . Acute Visit    Care Plan Meeting    HPI:  We have come together for a care plan meeting; she has no family present she is able to participate in the meeting. She lives in an independent living apartment with no steps; has a tub bench and walker at home. She is interested in a rollator for increased independence. She is working on balance and walking; her short term memory and problem solving. She does cook at home. She will need a tub transfer bench. The goal remains for her to return back home. She denies any weakness; no headaches; no vertigo. There are no nursing concerns at this time.    Past Medical History:  Diagnosis Date  . Anemia 04/03/2011  . Anginal pain ([place])   . Anxiety   . Arthritis    "feet" (06/02/2017)  . Blind left eye 1999  . Depression   . Exertional dyspnea   . GERD (gastroesophageal reflux disease)   . High cholesterol   . Hip fracture ([place]) 07/17/11   fall from 1-2 feet; left  . Hypertension   . Ischemic stroke ([place]) 06/02/2017   "weak moving around" (06/02/2017)  . Prosthetic eye globe 1999   "from diabetic; left eye"  . Tongue cancer ([place]) 1998   S/P radiation  . Type II diabetes mellitus ([place])    not taking any medication (06/02/2017)    Past Surgical History:  Procedure Laterality Date  . ENUCLEATION Left   . FRACTURE SURGERY    . HIP ARTHROPLASTY  07/18/2011   Procedure: ARTHROPLASTY BIPOLAR HIP;  Surgeon: Nita Sells, MD;  Location: [facility];  Service: Orthopedics;  Laterality: Left;  . INTRAOCULAR PROSTHESES INSERTION  1999   left  . TONGUE BIOPSY  1998   "cancer"  . TUBAL LIGATION  1970's    Social History   Socioeconomic History  . Marital status: Single    Spouse name: Not on file  . Number of children: Not on file  . Years of education: Not on file  .  Highest education level: Not on file  Occupational History  . Not on file  Social Needs  . Financial resource strain: Not on file  . Food insecurity:    Worry: Not on file    Inability: Not on file  . Transportation needs:    Medical: Not on file    Non-medical: Not on file  Tobacco Use  . Smoking status: Former Smoker    Packs/day: 1.50    Years: 47.00    Pack years: 70.50    Types: Cigarettes    Last attempt to quit: 09/13/1997    Years since quitting: 19.7  . Smokeless tobacco: Never Used  Substance and Sexual Activity  . Alcohol use: Yes    Alcohol/week: 10.2 oz    Types: 17 Shots of liquor per week    Comment: 06/02/2017 "1/5 rum q weekend"  . Drug use: No    Types: Marijuana, Heroin    Comment: 06/02/2017  "last drug use was in the 1980s"  . Sexual activity: Not Currently  Lifestyle  . Physical activity:    Days per week: Not on file    Minutes per session: Not on file  . Stress: Not on file  Relationships  . Social connections:  Talks on phone: Not on file    Gets together: Not on file    Attends religious service: Not on file    Active member of club or organization: Not on file    Attends meetings of clubs or organizations: Not on file    Relationship status: Not on file  . Intimate partner violence:    Fear of current or ex partner: Not on file    Emotionally abused: Not on file    Physically abused: Not on file    Forced sexual activity: Not on file  Other Topics Concern  . Not on file  Social History Narrative  . Not on file   Family History  Problem Relation Age of Onset  . Diabetes type II Mother   . Diabetes type II Father   . Diabetes type II Sister   . Colon cancer Neg Hx       VITAL SIGNS BP (!) 130/54   Pulse 66   Temp 97.6 F (36.4 C)   Resp 20   Ht 5' (1.524 m)   Wt 134 lb (60.8 kg)   SpO2 99%   BMI 26.17 kg/m   Outpatient Encounter Medications as of 06/10/2017  Medication Sig  . acetaminophen (TYLENOL) 325 MG tablet Take 2  tablets (650 mg total) by mouth every 6 (six) hours as needed for mild pain or fever (or temp > 37.5 C (99.5 F)).  . ALPHAGAN P 0.1 % SOLN Place 1 drop into the right eye 2 (two) times daily.  Marland Kitchen amLODipine (NORVASC) 10 MG tablet Take 10 mg by mouth daily.  Marland Kitchen aspirin 81 MG tablet Take 81 mg by mouth daily.  Marland Kitchen atorvastatin (LIPITOR) 20 MG tablet take 1 tablet by mouth once daily (CHANGING FROM SIMVASTATIN)  . carvedilol (COREG) 25 MG tablet Take 0.5 tablets (12.5 mg total) by mouth 2 (two) times daily.  . folic acid (FOLVITE) 1 MG tablet Take 1 tablet (1 mg total) by mouth daily.  . furosemide (LASIX) 20 MG tablet take 1 tablet by mouth once daily  . gabapentin (NEURONTIN) 100 MG capsule Take 100 mg by mouth 3 (three) times daily.  Marland Kitchen HYDROcodone-acetaminophen (NORCO/VICODIN) 5-325 MG tablet Take 1/2 to 1 tablet every 6 hours as needed  . isosorbide mononitrate (IMDUR) 30 MG 24 hr tablet Take 30 mg by mouth daily.  . Multiple Vitamin (MULTIVITAMIN WITH MINERALS) TABS tablet Take 1 tablet by mouth daily.  Marland Kitchen omeprazole (PRILOSEC) 20 MG capsule take 1 capsule by mouth once daily  . thiamine 100 MG tablet Take 1 tablet (100 mg total) by mouth daily.  . TRAVATAN Z 0.004 % SOLN ophthalmic solution Place 1 drop into the right eye at bedtime.   . [DISCONTINUED] insulin aspart (NOVOLOG FLEXPEN) 100 UNIT/ML FlexPen Inject 3 units subcutaneously after meals for DM.  Give 3 units for CBG 150 or greater   No facility-administered encounter medications on file as of 06/10/2017.      SIGNIFICANT DIAGNOSTIC EXAMS  PREVIOUS:   06-02-17: chest x-ray: No active cardiopulmonary disease. Aortic Atherosclerosis   06-02-17: mri of brain: 1. No acute intracranial abnormality identified. 2. Mild progression of chronic microvascular ischemic changes and parenchymal volume loss of the brain from 2011. 3. Multiple new small chronic infarctions within the bilateral cerebellar hemispheres and left hemi pons.  06-03-17: NM  pulmonary perfusion: No appreciable ventilation or perfusion defects. This study constitutes a very low probability of pulmonary embolus.   06-03-17: 2-d echo:   -  Left ventricle: The cavity size was normal. There was moderate concentric hypertrophy. Systolic function was vigorous. The estimated ejection fraction was in the range of 65% to 70%. Wall motion was normal; there were no regional wall motion abnormalities. Doppler parameters are consistent with abnormal left ventricular relaxation (grade 1 diastolic dysfunction). Doppler parameters are consistent with elevated ventricular end-diastolic filling pressure. - Aortic valve: There was mild stenosis. There was mild regurgitation.  - Mitral valve: Calcified annulus. Mildly thickened leaflets . - Left atrium: The atrium was mildly dilated. - Right ventricle: The cavity size was normal. Wall thickness was normal. Systolic function was normal. - Right atrium: The atrium was normal in size. - Tricuspid valve: There was mild regurgitation. - Pulmonary arteries: Systolic pressure was within the normal range. - Inferior vena cava: The vessel was normal in size. - Pericardium, extracardiac: There was no pericardial effusion  06-05-17: myoview: There was no ST segment deviation noted during stress. Findings consistent with ischemia This is a low risk study The left ventricular ejection fraction is mildly decreased (45-54%). Small area of inferior basal ischemia SDS 7 EF normal 54%  NO NEW EXAMS.   LABS REVIEWED PREVIOUS:   06-02-17: wbc 5.9; hgb 14.7; hct 44.4; mcv 87.9; plt 254; glucose 175; bun 15; creat 1.15; k+ 4.1; na++ 139; ca 9.1; ast 75; alt 61; albumin 3.8; d-dimer: 2.11 06-03-17: wbc 5.1; hgb 13.7; hct 41.3; mcv 87.7; plt 233; glucose 84; bun 9; creat 0.91; k+ 3.4; na++ 144; ca 9.2; ast 63; alt 52; albumin 3.2; chol 154; ldl 76; trig 111; hdl 56; hgb a1c  5.6  urine culture: e-coli   NO NEW LABS.     Review of Systems  Constitutional:  Negative for malaise/fatigue.  Respiratory: Negative for cough and shortness of breath.   Cardiovascular: Negative for chest pain, palpitations and leg swelling.  Gastrointestinal: Negative for abdominal pain, constipation and heartburn.  Musculoskeletal: Negative for back pain, joint pain and myalgias.  Skin: Negative.   Neurological: Negative for dizziness.  Psychiatric/Behavioral: The patient is not nervous/anxious.     Physical Exam  Constitutional: She is oriented to person, place, and time. She appears well-developed and well-nourished. No distress.  Eyes:  Blind left eye has prosthetic eye globe    Neck: Thyromegaly present.  Thyroid slightly enlarged   Cardiovascular: Normal rate, regular rhythm and intact distal pulses.  Murmur heard. 2/6  Pulmonary/Chest: Effort normal and breath sounds normal. No respiratory distress.  Abdominal: Soft. Bowel sounds are normal. She exhibits no distension. There is no tenderness.  Musculoskeletal: Normal range of motion. She exhibits no edema.  Lymphadenopathy:    She has no cervical adenopathy.  Neurological: She is alert and oriented to person, place, and time.  Skin: Skin is warm and dry. She is not diaphoretic.  Psychiatric: She has a normal mood and affect.      ASSESSMENT/ PLAN:  TODAY:  1. Chronic diastolic heart failure: 2.  Ischemic stroke:  3.  Peripheral sensory neuropathy due to type 2 diabetes mellitus:    Will continue therapy as directed Will not make changes in her medications at this time   Time spent with patient: 30 minutes: discussed care needs; therapy and home health needs. Verbalized understanding.     MD is aware of resident's narcotic use and is in agreement with current plan of care. We will attempt to wean resident as apropriate   Ok Edwards NP [facility]  Contact [phone] Monday through Friday 8am- 5pm  After hours call [phone]

## 2017-06-11 ENCOUNTER — Non-Acute Institutional Stay (SKILLED_NURSING_FACILITY): Payer: Medicare Other | Admitting: Internal Medicine

## 2017-06-11 ENCOUNTER — Encounter: Payer: Self-pay | Admitting: Internal Medicine

## 2017-06-11 DIAGNOSIS — Z8673 Personal history of transient ischemic attack (TIA), and cerebral infarction without residual deficits: Secondary | ICD-10-CM | POA: Diagnosis not present

## 2017-06-11 DIAGNOSIS — N183 Chronic kidney disease, stage 3 (moderate): Secondary | ICD-10-CM

## 2017-06-11 DIAGNOSIS — I679 Cerebrovascular disease, unspecified: Secondary | ICD-10-CM

## 2017-06-11 DIAGNOSIS — I129 Hypertensive chronic kidney disease with stage 1 through stage 4 chronic kidney disease, or unspecified chronic kidney disease: Secondary | ICD-10-CM | POA: Diagnosis not present

## 2017-06-11 DIAGNOSIS — R5381 Other malaise: Secondary | ICD-10-CM | POA: Diagnosis not present

## 2017-06-11 DIAGNOSIS — I5032 Chronic diastolic (congestive) heart failure: Secondary | ICD-10-CM

## 2017-06-11 DIAGNOSIS — F1010 Alcohol abuse, uncomplicated: Secondary | ICD-10-CM

## 2017-06-11 DIAGNOSIS — E1159 Type 2 diabetes mellitus with other circulatory complications: Secondary | ICD-10-CM

## 2017-06-11 DIAGNOSIS — E1169 Type 2 diabetes mellitus with other specified complication: Secondary | ICD-10-CM

## 2017-06-11 DIAGNOSIS — E785 Hyperlipidemia, unspecified: Secondary | ICD-10-CM | POA: Diagnosis not present

## 2017-06-11 DIAGNOSIS — E1122 Type 2 diabetes mellitus with diabetic chronic kidney disease: Secondary | ICD-10-CM

## 2017-06-11 NOTE — Progress Notes (Signed)
Provider:  DR Arletha Grippe Location:  [facility] Room Number: [place] of Service:  SNF ([phone])  PCP: Dierdre Harness, FNP Patient Care Team: Dierdre Harness, FNP as PCP - General (Family Medicine) Belva Crome, MD as PCP - Cardiology (Cardiology)  Extended Emergency Contact Information Primary Emergency Contact: McDowell,Tynisa Address: [address]          [place], [place] Phone: [phone] Relation: Grandaughter Secondary Emergency Contact: Annamary Rummage [place] Mobile Phone: [phone] Relation: Grandson  Code Status: DNR Goals of Care: Advanced Directive information Advanced Directives 06/11/2017  Does Patient Have a Medical Advance Directive? Yes  Type of Advance Directive Out of facility DNR (pink MOST or yellow form)  Does patient want to make changes to medical advance directive? No - Patient declined  Would patient like information on creating a medical advance directive? No - Patient declined  Pre-existing out of facility DNR order (yellow form or pink MOST form) Yellow form placed in chart (order not valid for inpatient use)      Chief Complaint  Patient presents with  . New Admit To SNF    Admission    HPI: Patient is a 84 y.o. female seen today for admission to SNF following hospital stay for CP, chronic dHF, HTNsive urgency, ischemic stroke, DM2 with vascular disease, CKD stage 2, Etoh abuse. V/Q scan low prob of PE; CE neg x 3. Nuclear stress test showed low risk. MRI brain (+) new strokes. Her BP meds were adjusted. UTI tx with IV rocephin x 3 days A1c 5.7%; AST 63; albumin 3.2; Cr 1.3; B12 level 612; folate 1058; LDL 76; Hgb 13.7 at hospital d/c. She presents to SNF for short term rehab.  Today she reports no medical concerns. No further CP. She does not like the food and went to get takeout from [employer]. CBGs 90-120s. No low BS reactions. She has some short  term memory loss. She reports she drinks approx 10 Etoh beverages (beer + rum) on the weekends.   She is a native of the [place].    Past Medical History:  Diagnosis Date  . Anemia 04/03/2011  . Anginal pain ([place])   . Anxiety   . Arthritis    "feet" (06/02/2017)  . Blind left eye 1999  . Depression   . Exertional dyspnea   . GERD (gastroesophageal reflux disease)   . High cholesterol   . Hip fracture ([place]) 07/17/11   fall from 1-2 feet; left  . Hypertension   . Ischemic stroke ([place]) 06/02/2017   "weak moving around" (06/02/2017)  . Prosthetic eye globe 1999   "from diabetic; left eye"  . Tongue cancer ([place]) 1998   S/P radiation  . Type II diabetes mellitus ([place])    not taking any medication (06/02/2017)   Past Surgical History:  Procedure Laterality Date  . ENUCLEATION Left   . FRACTURE SURGERY    . HIP ARTHROPLASTY  07/18/2011   Procedure: ARTHROPLASTY BIPOLAR HIP;  Surgeon: Nita Sells, MD;  Location: [facility];  Service: Orthopedics;  Laterality: Left;  . INTRAOCULAR PROSTHESES INSERTION  1999   left  . TONGUE BIOPSY  1998   "cancer"  . TUBAL LIGATION  1970's    reports that she quit smoking about 19 years ago. Her smoking use included cigarettes. She has a 70.50 pack-year smoking history. She has never used smokeless tobacco. She reports that she drinks  about 10.2 oz of alcohol per week. She reports that she does not use drugs. Social History   Socioeconomic History  . Marital status: Single    Spouse name: Not on file  . Number of children: Not on file  . Years of education: Not on file  . Highest education level: Not on file  Occupational History  . Not on file  Social Needs  . Financial resource strain: Not on file  . Food insecurity:    Worry: Not on file    Inability: Not on file  . Transportation needs:    Medical: Not on file    Non-medical: Not on file  Tobacco Use  . Smoking status: Former Smoker    Packs/day: 1.50    Years: 47.00     Pack years: 70.50    Types: Cigarettes    Last attempt to quit: 09/13/1997    Years since quitting: 19.7  . Smokeless tobacco: Never Used  Substance and Sexual Activity  . Alcohol use: Yes    Alcohol/week: 10.2 oz    Types: 17 Shots of liquor per week    Comment: 06/02/2017 "1/5 rum q weekend"  . Drug use: No    Types: Marijuana, Heroin    Comment: 06/02/2017  "last drug use was in the 1980s"  . Sexual activity: Not Currently  Lifestyle  . Physical activity:    Days per week: Not on file    Minutes per session: Not on file  . Stress: Not on file  Relationships  . Social connections:    Talks on phone: Not on file    Gets together: Not on file    Attends religious service: Not on file    Active member of club or organization: Not on file    Attends meetings of clubs or organizations: Not on file    Relationship status: Not on file  . Intimate partner violence:    Fear of current or ex partner: Not on file    Emotionally abused: Not on file    Physically abused: Not on file    Forced sexual activity: Not on file  Other Topics Concern  . Not on file  Social History Narrative  . Not on file    Functional Status Survey:    Family History  Problem Relation Age of Onset  . Diabetes type II Mother   . Diabetes type II Father   . Diabetes type II Sister   . Colon cancer Neg Hx     Health Maintenance  Topic Date Due  . FOOT EXAM  07/12/2018 (Originally 04/11/1942)  . OPHTHALMOLOGY EXAM  07/12/2018 (Originally 04/11/1942)  . URINE MICROALBUMIN  07/12/2018 (Originally 02/28/2012)  . DEXA SCAN  07/12/2018 (Originally 04/12/1999)  . INFLUENZA VACCINE  08/13/2017  . HEMOGLOBIN A1C  12/04/2017  . TETANUS/TDAP  Discontinued  . PNA vac Low Risk Adult  Discontinued    No Known Allergies  Outpatient Encounter Medications as of 06/11/2017  Medication Sig  . acetaminophen (TYLENOL) 325 MG tablet Take 2 tablets (650 mg total) by mouth every 6 (six) hours as needed for mild pain or  fever (or temp > 37.5 C (99.5 F)).  . ALPHAGAN P 0.1 % SOLN Place 1 drop into the right eye 2 (two) times daily.  Marland Kitchen amLODipine (NORVASC) 10 MG tablet Take 10 mg by mouth daily.  Marland Kitchen aspirin 81 MG tablet Take 81 mg by mouth daily.  Marland Kitchen atorvastatin (LIPITOR) 20 MG tablet take 1 tablet by  mouth once daily (CHANGING FROM SIMVASTATIN)  . carvedilol (COREG) 25 MG tablet Take 0.5 tablets (12.5 mg total) by mouth 2 (two) times daily.  . folic acid (FOLVITE) 1 MG tablet Take 1 tablet (1 mg total) by mouth daily.  . furosemide (LASIX) 20 MG tablet take 1 tablet by mouth once daily  . gabapentin (NEURONTIN) 100 MG capsule Take 100 mg by mouth 3 (three) times daily.  Marland Kitchen HYDROcodone-acetaminophen (NORCO/VICODIN) 5-325 MG tablet Take 1/2 to 1 tablet every 6 hours as needed  . isosorbide mononitrate (IMDUR) 30 MG 24 hr tablet Take 30 mg by mouth daily.  . Multiple Vitamin (MULTIVITAMIN WITH MINERALS) TABS tablet Take 1 tablet by mouth daily.  . Nutritional Supplements (NUTRITIONAL SUPPLEMENT PO) NAS (No Salt Added) Regular texture, Regular / thin consistency  . omeprazole (PRILOSEC) 20 MG capsule take 1 capsule by mouth once daily  . thiamine 100 MG tablet Take 1 tablet (100 mg total) by mouth daily.  . TRAVATAN Z 0.004 % SOLN ophthalmic solution Place 1 drop into the right eye at bedtime.    No facility-administered encounter medications on file as of 06/11/2017.     Review of Systems  Psychiatric/Behavioral:       Memory loss  All other systems reviewed and are negative.   Vitals:   06/11/17 1025  BP: (!) 166/68  Pulse: 68  Resp: 20  Temp: 98.3 F (36.8 C)  SpO2: 95%  Weight: 134 lb (60.8 kg)  Height: 5' (1.524 m)   Body mass index is 26.17 kg/m. Physical Exam  Constitutional: She is oriented to person, place, and time. She appears well-developed.  Frail appearing in NAD, sitting in w/c.  HENT:  Mouth/Throat: Oropharynx is clear and moist. No oropharyngeal exudate.  MMM; no oral thrush    Eyes: Pupils are equal, round, and reactive to light. Right eye exhibits no discharge. No scleral icterus.  Prosthetic OS; OD PERRL  Neck: Neck supple. Carotid bruit is not present. No tracheal deviation present. No thyromegaly present.  Cardiovascular: Normal rate, regular rhythm and intact distal pulses. Exam reveals no gallop and no friction rub.  Murmur (1/6 SEM) heard. Trace LE edema b/l. No calf TTP  Pulmonary/Chest: Effort normal and breath sounds normal. No stridor. No respiratory distress. She has no wheezes. She has no rales. She exhibits no tenderness.  Abdominal: Soft. Normal appearance and bowel sounds are normal. She exhibits no distension and no mass. There is no hepatomegaly. There is no tenderness. There is no rigidity, no rebound and no guarding. No hernia.  Musculoskeletal: She exhibits edema (small joints).  Lymphadenopathy:    She has no cervical adenopathy.  Neurological: She is alert and oriented to person, place, and time. She has normal reflexes.  Skin: Skin is warm and dry. No rash noted.  Psychiatric: She has a normal mood and affect. Her behavior is normal. Thought content normal.    Labs reviewed: Basic Metabolic Panel: Recent Labs    06/02/17 1128 06/03/17 0505 06/04/17 0924 06/04/17 1025 06/10/17  NA 139 144  --  138 143  K 4.1 3.4*  --  4.4 4.2  CL 109 108  --  106  --   CO2 17* 25  --  21*  --   GLUCOSE 175* 84  --  83  --   BUN 15 9  --  21* 22*  CREATININE 1.15* 0.91  --  1.56* 1.3*  CALCIUM 9.1 9.2  --  9.2  --  MG  --   --  1.8  --   --    Liver Function Tests: Recent Labs    11/22/16 1331 06/02/17 1957 06/03/17 0505  AST 68* 75* 63*  ALT 48 61* 52  ALKPHOS 82 85 70  BILITOT 0.8 0.9 0.7  PROT 7.2 8.1 7.2  ALBUMIN 3.9 3.8 3.2*   Recent Labs    06/03/17 0822  LIPASE 36   No results for input(s): AMMONIA in the last 8760 hours. CBC: Recent Labs    11/22/16 1331 06/02/17 1807 06/03/17 0505 06/03/17 0822  WBC 8.0 5.9 5.1   --   NEUTROABS 5.3  --   --   --   HGB 13.5 14.7 13.7  --   HCT 40.1 44.4 41.3 40.8  MCV 89.3 87.9 87.7  --   PLT 179 254 233  --    Cardiac Enzymes: Recent Labs    06/02/17 2326 06/03/17 0505 06/03/17 1140  TROPONINI <0.03 <0.03 <0.03   BNP: Invalid input(s): POCBNP Lab Results  Component Value Date   HGBA1C 5.7 (H) 06/03/2017   Lab Results  Component Value Date   TSH 2.40 06/10/2017   Lab Results  Component Value Date   VITAMINB12 612 06/03/2017   No results found for: FOLATE Lab Results  Component Value Date   IRON 62 03/27/2011   TIBC 342 03/27/2011   FERRITIN 323 (H) 03/27/2011    Imaging and Procedures obtained prior to SNF admission: Dg Chest 2 View  Result Date: 06/02/2017 CLINICAL DATA:  Chest pain. EXAM: CHEST - 2 VIEW COMPARISON:  Radiographs of November 22, 2016. FINDINGS: The heart size and mediastinal contours are within normal limits. Both lungs are clear. Atherosclerosis of thoracic aorta is noted. No pneumothorax or pleural effusion is noted. The visualized skeletal structures are unremarkable. IMPRESSION: No active cardiopulmonary disease. Aortic Atherosclerosis (ICD10-I70.0). Electronically Signed   By: Marijo Conception, M.D.   On: 06/02/2017 11:57   Mr Brain Wo Contrast  Result Date: 06/02/2017 CLINICAL DATA:  84 y/o F; ataxia, stroke suspected. History of gum cancer status post radiation. EXAM: MRI HEAD WITHOUT CONTRAST TECHNIQUE: Multiplanar, multiecho pulse sequences of the brain and surrounding structures were obtained without intravenous contrast. COMPARISON:  10/12/2009 CT head.  06/06/2009 MRI head. FINDINGS: Brain: No acute infarction, hemorrhage, hydrocephalus, extra-axial collection or mass lesion. Small chronic infarctions are present within the bilateral cerebellar hemispheres and the left hemi pons, new from 2011. Mild progression of moderate chronic microvascular ischemic changes and parenchymal volume loss of the brain for age. Vascular:  Normal flow voids. Skull and upper cervical spine: Hyperostosis frontalis interna. Sinuses/Orbits: Negative.  Left globe replacement. Other: None. IMPRESSION: 1. No acute intracranial abnormality identified. 2. Mild progression of chronic microvascular ischemic changes and parenchymal volume loss of the brain from 2011. 3. Multiple new small chronic infarctions within the bilateral cerebellar hemispheres and left hemi pons. Electronically Signed   By: Kristine Garbe M.D.   On: 06/02/2017 19:05   Nm Pulmonary Perf And Vent  Result Date: 06/03/2017 CLINICAL DATA:  Shortness of breath and chest pain EXAM: NUCLEAR MEDICINE VENTILATION - PERFUSION LUNG SCAN VIEWS: Anterior, posterior, left lateral, right lateral, RPO, LPO, RAO, LAO-ventilation and perfusion RADIOPHARMACEUTICALS:  32.6 mCi of Tc-80m DTPA aerosol inhalation and 4.3 mCi Tc52m-MAA IV COMPARISON:  Chest radiograph Jun 02, 2017 FINDINGS: Ventilation: Radiotracer uptake on the ventilation study is homogeneous and symmetric bilaterally. No ventilation defects are evident. Perfusion: Radiotracer uptake on the perfusion study is  homogeneous and symmetric bilaterally. No perfusion defects are evident. IMPRESSION: No appreciable ventilation or perfusion defects. This study constitutes a very low probability of pulmonary embolus. Electronically Signed   By: Lowella Grip III M.D.   On: 06/03/2017 12:11    Assessment/Plan   ICD-10-CM   1. Physical deconditioning R53.81   2. Cerebrovascular disease I67.9   3. Hypertension associated with stage 3 chronic kidney disease due to type 2 diabetes mellitus (HCC) E11.22    I12.9    N18.3   4. Dyslipidemia associated with type 2 diabetes mellitus ([place]) E11.69    E78.5   5. Chronic diastolic heart failure (HCC) I50.32   6. Alcohol abuse F10.10   7. History of stroke Z86.73   8. Type 2 diabetes mellitus with vascular disease ([place]) E11.59      She is noncompliant with diet - Discussed low  sodium diet to improve blood pressure  Cont current meds as ordered  Cont PT/OT/ST as ordered  F/u with cardio Dr Tamala Julian as scheduled 06/22/17  GOAL: short term rehab and d/c to ALF vs home when medically appropriate. Communicated with pt and nursing.  Will follow  Labs/tests ordered: none    Lew Prout S. Perlie Gold  [facility] [address] [place], [place] [address] [phone] Cell (Monday-Friday 8 AM - 5 PM) [phone] After 5 PM and follow prompts

## 2017-06-15 ENCOUNTER — Other Ambulatory Visit: Payer: Self-pay

## 2017-06-15 ENCOUNTER — Non-Acute Institutional Stay (SKILLED_NURSING_FACILITY): Payer: Medicare Other | Admitting: Adult Health

## 2017-06-15 ENCOUNTER — Encounter: Payer: Self-pay | Admitting: Adult Health

## 2017-06-15 DIAGNOSIS — E1142 Type 2 diabetes mellitus with diabetic polyneuropathy: Secondary | ICD-10-CM

## 2017-06-15 DIAGNOSIS — E1159 Type 2 diabetes mellitus with other circulatory complications: Secondary | ICD-10-CM | POA: Diagnosis not present

## 2017-06-15 DIAGNOSIS — I639 Cerebral infarction, unspecified: Secondary | ICD-10-CM | POA: Diagnosis not present

## 2017-06-15 MED ORDER — HYDROCODONE-ACETAMINOPHEN 5-325 MG PO TABS: ORAL_TABLET | ORAL | 0 refills | Status: DC

## 2017-06-15 NOTE — Progress Notes (Signed)
Location:   [facility] Room Number: [place] of Service:  SNF (31)    CODE STATUS: DNR  No Known Allergies  Chief Complaint  Patient presents with  . Discharge Note    Discharging on 06/19/17    HPI:  She is being discharged to home with home health for pt/ot/st/rn. She will need a rollator and transfer tub bench. She will need her prescriptions written and will need to follow up with her medical provider. She had been hospitalized for cva and weakness. She was admitted to this facility for short term rehab. She is now ready for discharged back home.    Past Medical History:  Diagnosis Date  . Anemia 04/03/2011  . Anginal pain ([place])   . Anxiety   . Arthritis    "feet" (06/02/2017)  . Blind left eye 1999  . Depression   . Exertional dyspnea   . GERD (gastroesophageal reflux disease)   . High cholesterol   . Hip fracture ([place]) 07/17/11   fall from 1-2 feet; left  . Hypertension   . Ischemic stroke ([place]) 06/02/2017   "weak moving around" (06/02/2017)  . Prosthetic eye globe 1999   "from diabetic; left eye"  . Tongue cancer ([place]) 1998   S/P radiation  . Type II diabetes mellitus ([place])    not taking any medication (06/02/2017)    Past Surgical History:  Procedure Laterality Date  . ENUCLEATION Left   . FRACTURE SURGERY    . HIP ARTHROPLASTY  07/18/2011   Procedure: ARTHROPLASTY BIPOLAR HIP;  Surgeon: Nita Sells, MD;  Location: [place];  Service: Orthopedics;  Laterality: Left;  . INTRAOCULAR PROSTHESES INSERTION  1999   left  . TONGUE BIOPSY  1998   "cancer"  . TUBAL LIGATION  1970's    Social History   Socioeconomic History  . Marital status: Single    Spouse name: Not on file  . Number of children: Not on file  . Years of education: Not on file  . Highest education level: Not on file  Occupational History  . Not on file  Social Needs  . Financial resource strain: Not on file  . Food insecurity:    Worry: Not on file   Inability: Not on file  . Transportation needs:    Medical: Not on file    Non-medical: Not on file  Tobacco Use  . Smoking status: Former Smoker    Packs/day: 1.50    Years: 47.00    Pack years: 70.50    Types: Cigarettes    Last attempt to quit: 09/13/1997    Years since quitting: 19.7  . Smokeless tobacco: Never Used  Substance and Sexual Activity  . Alcohol use: Yes    Alcohol/week: 10.2 oz    Types: 17 Shots of liquor per week    Comment: 06/02/2017 "1/5 rum q weekend"  . Drug use: No    Types: Marijuana, Heroin    Comment: 06/02/2017  "last drug use was in the 1980s"  . Sexual activity: Not Currently  Lifestyle  . Physical activity:    Days per week: Not on file    Minutes per session: Not on file  . Stress: Not on file  Relationships  . Social connections:    Talks on phone: Not on file    Gets together: Not on file    Attends religious service: Not on file    Active member of club or organization: Not on file  Attends meetings of clubs or organizations: Not on file    Relationship status: Not on file  . Intimate partner violence:    Fear of current or ex partner: Not on file    Emotionally abused: Not on file    Physically abused: Not on file    Forced sexual activity: Not on file  Other Topics Concern  . Not on file  Social History Narrative  . Not on file   Family History  Problem Relation Age of Onset  . Diabetes type II Mother   . Diabetes type II Father   . Diabetes type II Sister   . Colon cancer Neg Hx     VITAL SIGNS BP (!) 166/68   Pulse 68   Temp 98.3 F (36.8 C)   Resp 20   Ht 5' (1.524 m)   Wt 134 lb (60.8 kg)   SpO2 95%   BMI 26.17 kg/m   Patient's Medications  New Prescriptions   No medications on file  Previous Medications   ACETAMINOPHEN (TYLENOL) 325 MG TABLET    Take 2 tablets (650 mg total) by mouth every 6 (six) hours as needed for mild pain or fever (or temp > 37.5 C (99.5 F)).   ALPHAGAN P 0.1 % SOLN    Place 1 drop  into the right eye 2 (two) times daily.   AMLODIPINE (NORVASC) 10 MG TABLET    Take 10 mg by mouth daily.   ASPIRIN 81 MG TABLET    Take 81 mg by mouth daily.   ATORVASTATIN (LIPITOR) 20 MG TABLET    take 1 tablet by mouth once daily (CHANGING FROM SIMVASTATIN)   BRIMONIDINE (ALPHAGAN) 0.2 % OPHTHALMIC SOLUTION    Place 1 drop into the right eye 2 (two) times daily.   CARVEDILOL (COREG) 25 MG TABLET    Take 0.5 tablets (12.5 mg total) by mouth 2 (two) times daily.   FOLIC ACID (FOLVITE) 1 MG TABLET    Take 1 tablet (1 mg total) by mouth daily.   FUROSEMIDE (LASIX) 20 MG TABLET    take 1 tablet by mouth once daily   GABAPENTIN (NEURONTIN) 100 MG CAPSULE    Take 100 mg by mouth 3 (three) times daily.   ISOSORBIDE MONONITRATE (IMDUR) 30 MG 24 HR TABLET    Take 30 mg by mouth daily.   MULTIPLE VITAMIN (MULTIVITAMIN WITH MINERALS) TABS TABLET    Take 1 tablet by mouth daily.   NUTRITIONAL SUPPLEMENTS (NUTRITIONAL SUPPLEMENT PO)    NAS (No Salt Added) Regular texture, Regular / thin consistency   OMEPRAZOLE (PRILOSEC) 20 MG CAPSULE    take 1 capsule by mouth once daily   THIAMINE 100 MG TABLET    Take 1 tablet (100 mg total) by mouth daily.   TRAVATAN Z 0.004 % SOLN OPHTHALMIC SOLUTION    Place 1 drop into the right eye at bedtime.   Modified Medications   Modified Medication Previous Medication   HYDROCODONE-ACETAMINOPHEN (NORCO/VICODIN) 5-325 MG TABLET HYDROcodone-acetaminophen (NORCO/VICODIN) 5-325 MG tablet      Take 1/2 to 1 tablet every 6 hours as needed    Take 1/2 to 1 tablet every 6 hours as needed  Discontinued Medications   No medications on file     SIGNIFICANT DIAGNOSTIC EXAMS   PREVIOUS:   06-02-17: chest x-ray: No active cardiopulmonary disease. Aortic Atherosclerosis   06-02-17: mri of brain: 1. No acute intracranial abnormality identified. 2. Mild progression of chronic microvascular ischemic changes and parenchymal  volume loss of the brain from 2011. 3. Multiple new small  chronic infarctions within the bilateral cerebellar hemispheres and left hemi pons.  06-03-17: NM pulmonary perfusion: No appreciable ventilation or perfusion defects. This study constitutes a very low probability of pulmonary embolus.   06-03-17: 2-d echo:   - Left ventricle: The cavity size was normal. There was moderate concentric hypertrophy. Systolic function was vigorous. The estimated ejection fraction was in the range of 65% to 70%. Wall motion was normal; there were no regional wall motion abnormalities. Doppler parameters are consistent with abnormal left ventricular relaxation (grade 1 diastolic dysfunction). Doppler parameters are consistent with elevated ventricular end-diastolic filling pressure. - Aortic valve: There was mild stenosis. There was mild regurgitation.  - Mitral valve: Calcified annulus. Mildly thickened leaflets . - Left atrium: The atrium was mildly dilated. - Right ventricle: The cavity size was normal. Wall thickness was normal. Systolic function was normal. - Right atrium: The atrium was normal in size. - Tricuspid valve: There was mild regurgitation. - Pulmonary arteries: Systolic pressure was within the normal range. - Inferior vena cava: The vessel was normal in size. - Pericardium, extracardiac: There was no pericardial effusion  06-05-17: myoview: There was no ST segment deviation noted during stress. Findings consistent with ischemia This is a low risk study The left ventricular ejection fraction is mildly decreased (45-54%). Small area of inferior basal ischemia SDS 7 EF normal 54%  NO NEW EXAMS.   LABS REVIEWED PREVIOUS:   06-02-17: wbc 5.9; hgb 14.7; hct 44.4; mcv 87.9; plt 254; glucose 175; bun 15; creat 1.15; k+ 4.1; na++ 139; ca 9.1; ast 75; alt 61; albumin 3.8; d-dimer: 2.11 06-03-17: wbc 5.1; hgb 13.7; hct 41.3; mcv 87.7; plt 233; glucose 84; bun 9; creat 0.91; k+ 3.4; na++ 144; ca 9.2; ast 63; alt 52; albumin 3.2; chol 154; ldl 76; trig 111; hdl  56; hgb a1c  5.6  urine culture: e-coli   NO NEW LABS.     Review of Systems  Constitutional: Negative for malaise/fatigue.  Respiratory: Negative for cough and shortness of breath.   Cardiovascular: Negative for chest pain, palpitations and leg swelling.  Gastrointestinal: Negative for abdominal pain, constipation and heartburn.  Musculoskeletal: Negative for back pain, joint pain and myalgias.  Skin: Negative.   Neurological: Negative for dizziness.  Psychiatric/Behavioral: The patient is not nervous/anxious.      Physical Exam  Constitutional: She is oriented to person, place, and time. She appears well-developed and well-nourished. No distress.  Eyes:  Blind left eye has prosthetic eye globe     Neck: Thyromegaly present.  Thyroid is slightly enlarged   Cardiovascular: Normal rate, regular rhythm and intact distal pulses.  Murmur heard. 2/6  Pulmonary/Chest: Effort normal and breath sounds normal. No respiratory distress.  Abdominal: Soft. Bowel sounds are normal. She exhibits no distension. There is no tenderness.  Musculoskeletal: Normal range of motion. She exhibits no edema.  Lymphadenopathy:    She has no cervical adenopathy.  Neurological: She is alert and oriented to person, place, and time.  Skin: Skin is warm and dry. She is not diaphoretic.  Psychiatric: She has a normal mood and affect.    ASSESSMENT/ PLAN:   Patient is being discharged with the following home health services:  Pt/ot/rn/st: to evaluate and treat as indicated for gait balance strength adl training cognition and medication management.   Patient is being discharged with the following durable medical equipment:  rollator to allow her to maintain her current  level of independence with had adls and in the community. Tub transfer bench   Patient has been advised to f/u with their PCP in 1-2 weeks to bring them up to date on their rehab stay.  Social services at facility was responsible for arranging  this appointment.  Pt was provided with a 30 day supply of prescriptions for medications and refills must be obtained from their PCP.  For controlled substances, a more limited supply may be provided adequate until PCP appointment only.   A 30 day supply of her prescription of her medications per the list as above  Time spent with patient: 40 minutes: discussed home health needs and expectations and dme needs. Verbalized understanding.    Ok Edwards NP [facility]  Contact [phone] Monday through Friday 8am- 5pm  After hours call [phone]

## 2017-06-15 NOTE — Telephone Encounter (Signed)
Discharged with patient 

## 2017-06-22 ENCOUNTER — Telehealth: Payer: Self-pay | Admitting: Interventional Cardiology

## 2017-06-22 ENCOUNTER — Ambulatory Visit: Payer: Medicare Other | Admitting: Cardiology

## 2017-06-22 NOTE — Telephone Encounter (Signed)
Olin Hauser from [email or web address]  calling   Need orders for skilled nursing for pt. Please fax to [phone]

## 2017-06-22 NOTE — Progress Notes (Deleted)
Cardiology Office Note   Date:  06/22/2017   ID:  [name], DOB 17-Apr-1932, MRN [id number]  PCP:  Dierdre Harness, FNP  Cardiologist:  Dr. Tamala Julian    No chief complaint on file.     History of Present Illness: [name] is a 84 y.o. female who presents for post hospital and post holter.   history of history of gum cancer s/p radiation in [place] (1990s) HTN, HLD, DMT2, CKD and chronic diastolic CHF -last seen by Dr. Tamala Julian 05/13/17 for tacycardia 48 hr holter ordered.    Hospitalized 06/05/17  With chest pain, nuc study was done and neg for ischemia.  Echo EF 65-70%, no RWMA, G1DD,  Mild AS.  Her ddimer was elevated but VQ scan was neg.  No acute diastolic HF.   Holter with SR some PACs and PVCs and non sustained SVT at 150 bpm, no a fib. She did go to SNF for rehab and was d/c'd 06/15/17.  She did have an ischemic stroke with the hospitalization and hypertensive urgency.     Today ***  Past Medical History:  Diagnosis Date  . Anemia 04/03/2011  . Anginal pain ([place])   . Anxiety   . Arthritis    "feet" (06/02/2017)  . Blind left eye 1999  . Depression   . Exertional dyspnea   . GERD (gastroesophageal reflux disease)   . High cholesterol   . Hip fracture ([place]) 07/17/11   fall from 1-2 feet; left  . Hypertension   . Ischemic stroke ([place]) 06/02/2017   "weak moving around" (06/02/2017)  . Prosthetic eye globe 1999   "from diabetic; left eye"  . Tongue cancer ([place]) 1998   S/P radiation  . Type II diabetes mellitus ([place])    not taking any medication (06/02/2017)    Past Surgical History:  Procedure Laterality Date  . ENUCLEATION Left   . FRACTURE SURGERY    . HIP ARTHROPLASTY  07/18/2011   Procedure: ARTHROPLASTY BIPOLAR HIP;  Surgeon: Nita Sells, MD;  Location: [place];  Service: Orthopedics;  Laterality: Left;  . INTRAOCULAR PROSTHESES INSERTION  1999   left  . TONGUE BIOPSY  1998   "cancer"  . TUBAL LIGATION  1970's     Current Outpatient  Medications  Medication Sig Dispense Refill  . acetaminophen (TYLENOL) 325 MG tablet Take 2 tablets (650 mg total) by mouth every 6 (six) hours as needed for mild pain or fever (or temp > 37.5 C (99.5 F)).    . ALPHAGAN P 0.1 % SOLN Place 1 drop into the right eye 2 (two) times daily.    Marland Kitchen amLODipine (NORVASC) 10 MG tablet Take 10 mg by mouth daily.    Marland Kitchen aspirin 81 MG tablet Take 81 mg by mouth daily.    Marland Kitchen atorvastatin (LIPITOR) 20 MG tablet take 1 tablet by mouth once daily (CHANGING FROM SIMVASTATIN) 90 tablet 0  . brimonidine (ALPHAGAN) 0.2 % ophthalmic solution Place 1 drop into the right eye 2 (two) times daily.    . carvedilol (COREG) 25 MG tablet Take 0.5 tablets (12.5 mg total) by mouth 2 (two) times daily.    . folic acid (FOLVITE) 1 MG tablet Take 1 tablet (1 mg total) by mouth daily.    . furosemide (LASIX) 20 MG tablet take 1 tablet by mouth once daily 30 tablet 7  . gabapentin (NEURONTIN) 100 MG capsule Take 100 mg by mouth 3 (three) times daily.  3  . HYDROcodone-acetaminophen (NORCO/VICODIN) 5-325  MG tablet Take 1/2 to 1 tablet every 6 hours as needed 10 tablet 0  . isosorbide mononitrate (IMDUR) 30 MG 24 hr tablet Take 30 mg by mouth daily.    . Multiple Vitamin (MULTIVITAMIN WITH MINERALS) TABS tablet Take 1 tablet by mouth daily.    . Nutritional Supplements (NUTRITIONAL SUPPLEMENT PO) NAS (No Salt Added) Regular texture, Regular / thin consistency    . omeprazole (PRILOSEC) 20 MG capsule take 1 capsule by mouth once daily 30 capsule 8  . thiamine 100 MG tablet Take 1 tablet (100 mg total) by mouth daily.    . TRAVATAN Z 0.004 % SOLN ophthalmic solution Place 1 drop into the right eye at bedtime.      No current facility-administered medications for this visit.     Allergies:   Patient has no known allergies.    Social History:  The patient  reports that she quit smoking about 19 years ago. Her smoking use included cigarettes. She has a 70.50 pack-year smoking history. She  has never used smokeless tobacco. She reports that she drinks about 10.2 oz of alcohol per week. She reports that she does not use drugs.   Family History:  The patient's ***family history includes Diabetes type II in her father, mother, and sister.    ROS:  General:no colds or fevers, no weight changes Skin:no rashes or ulcers HEENT:no blurred vision, no congestion CV:see HPI PUL:see HPI GI:no diarrhea constipation or melena, no indigestion GU:no hematuria, no dysuria MS:no joint pain, no claudication Neuro:no syncope, no lightheadedness Endo:no diabetes, no thyroid disease Wt Readings from Last 3 Encounters:  06/15/17 134 lb (60.8 kg)  06/11/17 134 lb (60.8 kg)  06/10/17 134 lb (60.8 kg)     PHYSICAL EXAM: VS:  There were no vitals taken for this visit. , BMI There is no height or weight on file to calculate BMI. General:Pleasant affect, NAD Skin:Warm and dry, brisk capillary refill HEENT:normocephalic, sclera clear, mucus membranes moist Neck:supple, no JVD, no bruits  Heart:S1S2 RRR without murmur, gallup, rub or click Lungs:clear without rales, rhonchi, or wheezes ONG:EXBM, non tender, + BS, do not palpate liver spleen or masses Ext:no lower ext edema, 2+ pedal pulses, 2+ radial pulses Neuro:alert and oriented, MAE, follows commands, + facial symmetry    EKG:  EKG is ordered today. The ekg ordered today demonstrates ***   Recent Labs: 06/03/2017: ALT 52; Hemoglobin 13.7; Platelets 233 06/04/2017: Magnesium 1.8 06/10/2017: BUN 22; Creatinine 1.3; Potassium 4.2; Sodium 143; TSH 2.40    Lipid Panel    Component Value Date/Time   CHOL 154 06/03/2017 0505   TRIG 111 06/03/2017 0505   HDL 56 06/03/2017 0505   CHOLHDL 2.8 06/03/2017 0505   VLDL 22 06/03/2017 0505   LDLCALC 76 06/03/2017 0505       Other studies Reviewed: Additional studies/ records that were reviewed today include: ***.   ASSESSMENT AND PLAN:  1.  ***   Current medicines are reviewed with  the patient today.  The patient Has no concerns regarding medicines.  The following changes have been made:  See above Labs/ tests ordered today include:see above  Disposition:   FU:  see above  Signed, Cecilie Kicks, NP  06/22/2017 8:04 AM    [address], [place], [place], [place] Phone: [phone]; Fax: [phone]

## 2017-06-22 NOTE — Telephone Encounter (Signed)
Spoke with Hermenia Fiscal with [facility] and advised her that the pt was due to see [place] this morning and did not come in and it was [place].Ok Edwards NP that they need to talk with re: orders for skilled nursing visits.

## 2017-06-23 ENCOUNTER — Other Ambulatory Visit: Payer: Self-pay | Admitting: Interventional Cardiology

## 2017-06-28 NOTE — Progress Notes (Signed)
Cardiology Office Note    Date:  06/29/2017   ID:  [name], DOB [birth year], MRN [id number]  PCP:  Dierdre Harness, FNP  Cardiologist: Sinclair Grooms, MD   Chief Complaint  Patient presents with  . Hoarse    History of Present Illness:  [name] is a 84 y.o. female  with a history of history of gum cancer s/p radiation in [place] (1990s) HTN, HLD, DMT2, CKD and chronic diastolic CHF who presents to clinic for evaluation of chest pain.   Recent hospital stay.  Discharge summary reviewed.  No active/acute cardiac event noted.  There was concern for new neurological injury.  MRI demonstrated multiple areas of ischemic injury related to small vessel disease.  Not seen by neurology.  New increased hoarseness.  History of radiation therapy for tongue cancer.  Recurring chest pressure with exertion.  Recent nuclear perfusion study low risk.   Past Medical History:  Diagnosis Date  . Anemia 04/03/2011  . Anginal pain ([place])   . Anxiety   . Arthritis    "feet" (06/02/2017)  . Blind left eye 1999  . Depression   . Exertional dyspnea   . GERD (gastroesophageal reflux disease)   . High cholesterol   . Hip fracture ([place]) 07/17/11   fall from 1-2 feet; left  . Hypertension   . Ischemic stroke ([place]) 06/02/2017   "weak moving around" (06/02/2017)  . Prosthetic eye globe 1999   "from diabetic; left eye"  . Tongue cancer ([place]) 1998   S/P radiation  . Type II diabetes mellitus ([place])    not taking any medication (06/02/2017)    Past Surgical History:  Procedure Laterality Date  . ENUCLEATION Left   . FRACTURE SURGERY    . HIP ARTHROPLASTY  07/18/2011   Procedure: ARTHROPLASTY BIPOLAR HIP;  Surgeon: Nita Sells, MD;  Location: [facility];  Service: Orthopedics;  Laterality: Left;  . INTRAOCULAR PROSTHESES INSERTION  1999   left  . TONGUE BIOPSY  1998   "cancer"  . TUBAL LIGATION  1970's    Current Medications: Outpatient Medications Prior to Visit    Medication Sig Dispense Refill  . acetaminophen (TYLENOL) 325 MG tablet Take 2 tablets (650 mg total) by mouth every 6 (six) hours as needed for mild pain or fever (or temp > 37.5 C (99.5 F)).    . ALPHAGAN P 0.1 % SOLN Place 1 drop into the right eye 2 (two) times daily.    Marland Kitchen amLODipine (NORVASC) 10 MG tablet Take 10 mg by mouth daily.    Marland Kitchen aspirin 81 MG tablet Take 81 mg by mouth daily.    Marland Kitchen atorvastatin (LIPITOR) 20 MG tablet take 1 tablet by mouth once daily (CHANGING FROM SIMVASTATIN) 90 tablet 0  . carvedilol (COREG) 12.5 MG tablet Take 12.5 mg by mouth 2 (two) times daily with a meal.    . folic acid (FOLVITE) 1 MG tablet Take 1 tablet (1 mg total) by mouth daily.    . furosemide (LASIX) 20 MG tablet TAKE 1 TABLET BY MOUTH ONCE DAILY 30 tablet 11  . gabapentin (NEURONTIN) 100 MG capsule Take 100 mg by mouth 3 (three) times daily.  3  . HYDROcodone-acetaminophen (NORCO/VICODIN) 5-325 MG tablet Take 1/2 to 1 tablet every 6 hours as needed 10 tablet 0  . isosorbide mononitrate (IMDUR) 30 MG 24 hr tablet Take 30 mg by mouth daily.    . Multiple Vitamin (MULTIVITAMIN WITH MINERALS) TABS tablet Take 1 tablet  by mouth daily.    . Nutritional Supplements (NUTRITIONAL SUPPLEMENT PO) NAS (No Salt Added) Regular texture, Regular / thin consistency    . omeprazole (PRILOSEC) 20 MG capsule take 1 capsule by mouth once daily 30 capsule 8  . thiamine 100 MG tablet Take 1 tablet (100 mg total) by mouth daily.    . TRAVATAN Z 0.004 % SOLN ophthalmic solution Place 1 drop into the right eye at bedtime.     . brimonidine (ALPHAGAN) 0.2 % ophthalmic solution Place 1 drop into the right eye 2 (two) times daily.    . carvedilol (COREG) 25 MG tablet Take 0.5 tablets (12.5 mg total) by mouth 2 (two) times daily. (Patient not taking: Reported on 06/29/2017)     No facility-administered medications prior to visit.      Allergies:   Patient has no known allergies.   Social History   Socioeconomic History  .  Marital status: Single    Spouse name: Not on file  . Number of children: Not on file  . Years of education: Not on file  . Highest education level: Not on file  Occupational History  . Not on file  Social Needs  . Financial resource strain: Not on file  . Food insecurity:    Worry: Not on file    Inability: Not on file  . Transportation needs:    Medical: Not on file    Non-medical: Not on file  Tobacco Use  . Smoking status: Former Smoker    Packs/day: 1.50    Years: 47.00    Pack years: 70.50    Types: Cigarettes    Last attempt to quit: 09/13/1997    Years since quitting: 19.8  . Smokeless tobacco: Never Used  Substance and Sexual Activity  . Alcohol use: Yes    Alcohol/week: 10.2 oz    Types: 17 Shots of liquor per week    Comment: 06/02/2017 "1/5 rum q weekend"  . Drug use: No    Types: Marijuana, Heroin    Comment: 06/02/2017  "last drug use was in the 1980s"  . Sexual activity: Not Currently  Lifestyle  . Physical activity:    Days per week: Not on file    Minutes per session: Not on file  . Stress: Not on file  Relationships  . Social connections:    Talks on phone: Not on file    Gets together: Not on file    Attends religious service: Not on file    Active member of club or organization: Not on file    Attends meetings of clubs or organizations: Not on file    Relationship status: Not on file  Other Topics Concern  . Not on file  Social History Narrative  . Not on file     Family History:  The patient's none family history includes Diabetes type II in her father, mother, and sister.   ROS:   Please see the history of present illness.    Depression, anxiety, difficulty with balance, headaches, cough, shortness of breath, leg swelling, intermittent chest pain for which a nuclear perfusion study was performed during recent admission and was low risk.  Excessive sweating. All other systems reviewed and are negative.   PHYSICAL EXAM:   VS:  BP 140/80 (BP  Location: Right Arm, Patient Position: Sitting, Cuff Size: Normal)   Pulse 69   Ht 5' (1.524 m)   Wt 135 lb (61.2 kg)   SpO2 96%   BMI 26.37  kg/m    GEN: Well nourished, well developed, in no acute distress  HEENT: normal  Neck: no JVD, carotid bruits, or masses Cardiac: RRR; no murmurs, rubs, or gallops,no edema  Respiratory:  clear to auscultation bilaterally, normal work of breathing GI: soft, nontender, nondistended, + BS MS: no deformity or atrophy  Skin: warm and dry, no rash Neuro:  Alert and Oriented x 3, Strength and sensation are intact Psych: euthymic mood, full affect  Wt Readings from Last 3 Encounters:  06/29/17 135 lb (61.2 kg)  06/15/17 134 lb (60.8 kg)  06/11/17 134 lb (60.8 kg)      Studies/Labs Reviewed:   EKG:  EKG performed on 06/03/2017 reveals sinus rhythm, LVH, and nonspecific T wave flattening.  The tracing was personally reviewed.  Recent Labs: 06/03/2017: ALT 52; Hemoglobin 13.7; Platelets 233 06/04/2017: Magnesium 1.8 06/10/2017: BUN 22; Creatinine 1.3; Potassium 4.2; Sodium 143; TSH 2.40   Lipid Panel    Component Value Date/Time   CHOL 154 06/03/2017 0505   TRIG 111 06/03/2017 0505   HDL 56 06/03/2017 0505   CHOLHDL 2.8 06/03/2017 0505   VLDL 22 06/03/2017 0505   LDLCALC 76 06/03/2017 0505    Additional studies/ records that were reviewed today include:  48-hour Holter monitor: Performed May 25, 2017 Study Highlights      NSR  PAC's  PVC's with occasional bigeminy  Non sustained SVT at 150 bpm  No atrial fibrillation.   Minimum HR: 54 BPM at 6:51:25 AM(2) Maximum HR: 149 BPM at 5:56:39 PM(2) Average HR: 78 BPM   Myocardial perfusion imaging 06/05/2017:  There was no ST segment deviation noted during stress.  Findings consistent with ischemia.  This is a low risk study.  The left ventricular ejection fraction is mildly decreased (45-54%).   Small area of inferior basal ischemia SDS 7 EF normal 54%  2D Doppler  echocardiogram 06/03/2017: Study Conclusions  - Left ventricle: The cavity size was normal. There was moderate   concentric hypertrophy. Systolic function was vigorous. The   estimated ejection fraction was in the range of 65% to 70%. Wall   motion was normal; there were no regional wall motion   abnormalities. Doppler parameters are consistent with abnormal   left ventricular relaxation (grade 1 diastolic dysfunction).   Doppler parameters are consistent with elevated ventricular   end-diastolic filling pressure. - Aortic valve: There was mild stenosis. There was mild   regurgitation. Valve area (VTI): 1.69 cm^2. Valve area (Vmax):   1.56 cm^2. Valve area (Vmean): 1.63 cm^2. - Mitral valve: Calcified annulus. Mildly thickened leaflets .   Valve area by pressure half-time: 1.11 cm^2. - Left atrium: The atrium was mildly dilated. - Right ventricle: The cavity size was normal. Wall thickness was   normal. Systolic function was normal. - Right atrium: The atrium was normal in size. - Tricuspid valve: There was mild regurgitation. - Pulmonary arteries: Systolic pressure was within the normal   range. - Inferior vena cava: The vessel was normal in size. - Pericardium, extracardiac: There was no pericardial effusion.  ------------------------------------------------------------------- Study data:  Comparison was made to the study of 10/25/2015.  Study status:  Routine.  Procedure:  The patient reported no pain pre or post test. Transthoracic echocardiography. Image quality was adequate.  Study completion:  There were no complications. Transthoracic echocardiography.  M-mode, complete 2D, spectral Doppler, and color Doppler.  Birthdate:  Patient birthdate: Sep 24, 1932.  Age:  Patient is 84 yr old.  Sex:  Gender: female. BMI:  26.3 kg/m^2.  Blood pressure:     181/81  Patient status: Inpatient.  Study date:  Study date: 06/03/2017. Study time: 02:39 PM.  Location:  [facility]  laboratory.  -------------------------------------------------------------------  ------------------------------------------------------------------- Left ventricle:  The cavity size was normal. There was moderate concentric hypertrophy. Systolic function was vigorous. The estimated ejection fraction was in the range of 65% to 70%. Wall motion was normal; there were no regional wall motion abnormalities. Doppler parameters are consistent with abnormal left ventricular relaxation (grade 1 diastolic dysfunction). Doppler parameters are consistent with elevated ventricular end-diastolic filling pressure.   ASSESSMENT:    1. Chronic diastolic heart failure ([place])   2. Hoarseness   3. Palpitations   4. Dyslipidemia associated with type 2 diabetes mellitus ([place])   5. Stage 3 chronic kidney disease due to type 2 diabetes mellitus ([place])      PLAN:  In order of problems listed above:  1. No evidence of volume overload.  No change in current therapy 2. Consult Dr. Lucia Gaskins concerning hoarseness. 3. 30-day monitor to rule out atrial fibrillation and determine whether or not anticoagulation therapy as needed.  Brief episodes of PSVT were noted related either to ectopic atrial pacemaker.  No definite A. fib was identified. 4. LDL cholesterol is near target. 5. Evaluated.  Clinical follow-up 6 to 12 months.  Advised against alcohol use.  30-day monitor to exclude atrial fibrillation.  If AF identified, will need anticoagulation and discontinuation of aspirin.  Abnormal MRI likely related to microvascular disease rather than embolic CVA.  Extended office visit requiring additional time because review of recent hospitalization and findings was necessary.  Greater than 50% of the time during this extended visit was spent in counseling and establishing a treatment plan.  Medication Adjustments/Labs and Tests Ordered: Current medicines are reviewed at length with the patient today.  Concerns  regarding medicines are outlined above.  Medication changes, Labs and Tests ordered today are listed in the Patient Instructions below. There are no Patient Instructions on file for this visit.   Signed, Sinclair Grooms, MD  06/29/2017 10:53 AM    [facility] [address], [place], [place]  [address] Phone: [phone]; Fax: [phone]

## 2017-06-29 ENCOUNTER — Ambulatory Visit (INDEPENDENT_AMBULATORY_CARE_PROVIDER_SITE_OTHER): Payer: Medicare Other | Admitting: Interventional Cardiology

## 2017-06-29 ENCOUNTER — Encounter: Payer: Self-pay | Admitting: Interventional Cardiology

## 2017-06-29 VITALS — BP 140/80 | HR 69 | Ht 60.0 in | Wt 135.0 lb

## 2017-06-29 DIAGNOSIS — R002 Palpitations: Secondary | ICD-10-CM | POA: Diagnosis not present

## 2017-06-29 DIAGNOSIS — N183 Chronic kidney disease, stage 3 unspecified: Secondary | ICD-10-CM

## 2017-06-29 DIAGNOSIS — R490 Dysphonia: Secondary | ICD-10-CM

## 2017-06-29 DIAGNOSIS — I5032 Chronic diastolic (congestive) heart failure: Secondary | ICD-10-CM

## 2017-06-29 DIAGNOSIS — E1169 Type 2 diabetes mellitus with other specified complication: Secondary | ICD-10-CM | POA: Diagnosis not present

## 2017-06-29 DIAGNOSIS — E1122 Type 2 diabetes mellitus with diabetic chronic kidney disease: Secondary | ICD-10-CM

## 2017-06-29 DIAGNOSIS — E785 Hyperlipidemia, unspecified: Secondary | ICD-10-CM | POA: Diagnosis not present

## 2017-06-29 NOTE — Patient Instructions (Addendum)
Medication Instructions:  Your physician recommends that you continue on your current medications as directed. Please refer to the Current Medication list given to you today.  Labwork: None  Testing/Procedures: Your physician has recommended that you wear an event monitor. Event monitors are medical devices that record the heart's electrical activity. Doctors most often [place] these monitors to diagnose arrhythmias. Arrhythmias are problems with the speed or rhythm of the heartbeat. The monitor is a small, portable device. You can wear one while you do your normal daily activities. This is usually used to diagnose what is causing palpitations/syncope (passing out).   Follow-Up: Your physician wants you to follow-up in: 6-9 months with Dr. Tamala Julian or an APP on his team.  You will receive a reminder letter in the mail two months in advance. If you don't receive a letter, please call our office to schedule the follow-up appointment.   Any Other Special Instructions Will Be Listed Below (If Applicable).     If you need a refill on your cardiac medications before your next appointment, please call your pharmacy.

## 2017-07-08 ENCOUNTER — Ambulatory Visit (INDEPENDENT_AMBULATORY_CARE_PROVIDER_SITE_OTHER): Payer: Medicare Other

## 2017-07-08 DIAGNOSIS — R002 Palpitations: Secondary | ICD-10-CM | POA: Diagnosis not present

## 2017-08-21 ENCOUNTER — Other Ambulatory Visit: Payer: Self-pay

## 2017-08-21 MED ORDER — OMEPRAZOLE 20 MG PO CPDR: 20.0000 mg | DELAYED_RELEASE_CAPSULE | Freq: Every day | ORAL | 9 refills | Status: DC

## 2017-12-29 ENCOUNTER — Telehealth: Payer: Self-pay

## 2017-12-29 NOTE — Telephone Encounter (Signed)
   [facility] Pre-operative Risk Assessment    Request for surgical clearance:  1. What type of surgery is being performed? Direct Laryngoscopy   2. When is this surgery scheduled?  TBD   3. What type of clearance is required (medical clearance vs. Pharmacy clearance to hold med vs. Both)?  medical   4. Are there any medications that need to be held prior to surgery and how long?    5. Practice name and name of physician performing surgery? Dr Radene Journey   6. What is your office phone number [phone]    7.   What is your office fax number [phone]  8.   Anesthesia type (None, local, MAC, general) ? general   Frederik Schmidt 12/29/2017, 3:38 PM  _________________________________________________________________   (provider comments below)

## 2017-12-31 NOTE — Telephone Encounter (Signed)
ADDENDUM: procedure is Direct Laryngoscopy w/excision of vocal cord polyp.

## 2018-01-01 NOTE — Telephone Encounter (Signed)
   Primary Cardiologist:Henry Nicholes Stairs III, MD  Chart reviewed as part of pre-operative protocol coverage. Because of Caylei Drumwright's past medical history and time since last visit, he/she will require a follow-up visit in order to better assess preoperative cardiovascular risk.  She is scheduled for f/u with Dr. Tamala Julian next month. She will be evaluated for surgical clearance at that time.   Pre-op covering staff: - Please schedule appointment and call patient to inform them. - Please contact requesting surgeon's office via preferred method (i.e, phone, fax) to inform them of need for appointment prior to surgery.  If applicable, this message will also be routed to pharmacy pool and/or primary cardiologist for input on holding anticoagulant/antiplatelet agent as requested below so that this information is available at time of patient's appointment.   Lyda Jester, PA-C  01/01/2018, 10:43 AM

## 2018-01-01 NOTE — Telephone Encounter (Signed)
Reached out to pt re: surgical clearance received.  She has been made aware that it will be addressed at her f/u appt with Dr. Tamala Julian 01/25/18. Pt thanked me for the call.

## 2018-01-24 NOTE — Progress Notes (Deleted)
Cardiology Office Note:    Date:  01/24/2018   ID:  [name], DOB [birth year], MRN [id number]  PCP:  Kayla Harness, FNP  Cardiologist:  Kayla Grooms, MD   Referring MD: Kayla Harness, FNP   No chief complaint on file.   History of Present Illness:    [name] is a 85 y.o. female with a hx of  gum cancer s/p radiation in [place] (1990s) HTN, HLD, DMT2, CKD, CVA,  and chronic diastolic CHF who presents to clinic for evaluation of chest pain.  Past Medical History:  Diagnosis Date  . Anemia 04/03/2011  . Anginal pain ([place])   . Anxiety   . Arthritis    "feet" (06/02/2017)  . Blind left eye 1999  . Depression   . Exertional dyspnea   . GERD (gastroesophageal reflux disease)   . High cholesterol   . Hip fracture ([place]) 07/17/11   fall from 1-2 feet; left  . Hypertension   . Ischemic stroke ([place]) 06/02/2017   "weak moving around" (06/02/2017)  . Prosthetic eye globe 1999   "from diabetic; left eye"  . Tongue cancer ([place]) 1998   S/P radiation  . Type II diabetes mellitus ([place])    not taking any medication (06/02/2017)    Past Surgical History:  Procedure Laterality Date  . ENUCLEATION Left   . FRACTURE SURGERY    . HIP ARTHROPLASTY  07/18/2011   Procedure: ARTHROPLASTY BIPOLAR HIP;  Surgeon: Kayla Sells, MD;  Location: [facility];  Service: Orthopedics;  Laterality: Left;  . INTRAOCULAR PROSTHESES INSERTION  1999   left  . TONGUE BIOPSY  1998   "cancer"  . TUBAL LIGATION  1970's    Current Medications: No outpatient medications have been marked as taking for the 01/25/18 encounter (Appointment) with Kayla Crome, MD.     Allergies:   Patient has no known allergies.   Social History   Socioeconomic History  . Marital status: Single    Spouse name: Not on file  . Number of children: Not on file  . Years of education: Not on file  . Highest education level: Not on file  Occupational History  . Not on file  Social Needs  . Financial  resource strain: Not on file  . Food insecurity:    Worry: Not on file    Inability: Not on file  . Transportation needs:    Medical: Not on file    Non-medical: Not on file  Tobacco Use  . Smoking status: Former Smoker    Packs/day: 1.50    Years: 47.00    Pack years: 70.50    Types: Cigarettes    Last attempt to quit: 09/13/1997    Years since quitting: 20.3  . Smokeless tobacco: Never Used  Substance and Sexual Activity  . Alcohol use: Yes    Alcohol/week: 17.0 standard drinks    Types: 17 Shots of liquor per week    Comment: 06/02/2017 "1/5 rum q weekend"  . Drug use: No    Types: Marijuana, Heroin    Comment: 06/02/2017  "last drug use was in the 1980s"  . Sexual activity: Not Currently  Lifestyle  . Physical activity:    Days per week: Not on file    Minutes per session: Not on file  . Stress: Not on file  Relationships  . Social connections:    Talks on phone: Not on file    Gets together: Not on file  Attends religious service: Not on file    Active member of club or organization: Not on file    Attends meetings of clubs or organizations: Not on file    Relationship status: Not on file  Other Topics Concern  . Not on file  Social History Narrative  . Not on file     Family History: The patient's family history includes Diabetes type II in her father, mother, and sister. There is no history of Colon cancer.  ROS:   Please see the history of present illness.    *** All other systems reviewed and are negative.  EKGs/Labs/Other Studies Reviewed:    The following studies were reviewed today: ***  EKG:  EKG is ***  Recent Labs: 06/03/2017: ALT 52; Hemoglobin 13.7; Platelets 233 06/04/2017: Magnesium 1.8 06/10/2017: BUN 22; Creatinine 1.3; Potassium 4.2; Sodium 143; TSH 2.40  Recent Lipid Panel    Component Value Date/Time   CHOL 154 06/03/2017 0505   TRIG 111 06/03/2017 0505   HDL 56 06/03/2017 0505   CHOLHDL 2.8 06/03/2017 0505   VLDL 22 06/03/2017  0505   LDLCALC 76 06/03/2017 0505    Physical Exam:    VS:  There were no vitals taken for this visit.    Wt Readings from Last 3 Encounters:  06/29/17 135 lb (61.2 kg)  06/15/17 134 lb (60.8 kg)  06/11/17 134 lb (60.8 kg)     GEN: ***. No acute distress HEENT: Normal NECK: No JVD. LYMPHATICS: No lymphadenopathy CARDIAC: ***RRR.  *** murmur, gallop, edema VASCULAR: Pulses ***, Bruits *** RESPIRATORY:  Clear to auscultation without rales, wheezing or rhonchi  ABDOMEN: Soft, non-tender, non-distended, No pulsatile mass, MUSCULOSKELETAL: No deformity  SKIN: Warm and dry NEUROLOGIC:  Alert and oriented x 3 PSYCHIATRIC:  Normal affect   ASSESSMENT:    1. Chronic diastolic heart failure ([place])   2. Dyslipidemia associated with type 2 diabetes mellitus ([place])   3. Essential hypertension   4. Type 2 diabetes mellitus with hyperosmolarity without coma, with long-term current use of insulin ([facility])   5. Paroxysmal atrial fibrillation ([facility])   6. Stage 3 chronic kidney disease due to type 2 diabetes mellitus ([place])    PLAN:    In order of problems listed above:  1. ***   Medication Adjustments/Labs and Tests Ordered: Current medicines are reviewed at length with the patient today.  Concerns regarding medicines are outlined above.  No orders of the defined types were placed in this encounter.  No orders of the defined types were placed in this encounter.   There are no Patient Instructions on file for this visit.   Signed, Kayla Grooms, MD  01/24/2018 7:02 PM    [place]

## 2018-01-25 ENCOUNTER — Ambulatory Visit: Payer: Medicare Other | Admitting: Interventional Cardiology

## 2018-01-26 NOTE — Progress Notes (Addendum)
Cardiology Office Note:    Date:  02/03/2018   ID:  [name], DOB [birth year], MRN [id number]  PCP:  Dierdre Harness, FNP  Cardiologist:  Sinclair Grooms, MD   Referring MD: Dierdre Harness, FNP   Chief Complaint  Patient presents with  . Cough  . Shortness of Breath    When lying    History of Present Illness:    [name] is a 85 y.o. female with a hx of gum cancer s/p radiation in [place] (1990s) HTN, HLD, DMT2, CKD and chronic diastolic CHF who presents to clinic for evaluation of chest pain. Days complaint is dyspnea and cough for several months.  Has had cough, clear phlegm production, and 3 pillow orthopnea since her stroke.  No lower extremity swelling is been noted.  Mild dyspnea on exertion is been noted but without chest discomfort.  She has not had hemoptysis.  She was able to ambulate into the office today without difficulty.  She was able to lie down for exam without difficulty.  She advocates compliance with her current medical regimen.   Past Medical History:  Diagnosis Date  . Anemia 04/03/2011  . Anginal pain ([place])   . Anxiety   . Arthritis    "feet" (06/02/2017)  . Blind left eye 1999  . Depression   . Exertional dyspnea   . GERD (gastroesophageal reflux disease)   . High cholesterol   . Hip fracture ([place]) 07/17/11   fall from 1-2 feet; left  . Hypertension   . Ischemic stroke ([place]) 06/02/2017   "weak moving around" (06/02/2017)  . Prosthetic eye globe 1999   "from diabetic; left eye"  . Tongue cancer ([place]) 1998   S/P radiation  . Type II diabetes mellitus ([place])    not taking any medication (06/02/2017)    Past Surgical History:  Procedure Laterality Date  . ENUCLEATION Left   . FRACTURE SURGERY    . HIP ARTHROPLASTY  07/18/2011   Procedure: ARTHROPLASTY BIPOLAR HIP;  Surgeon: Nita Sells, MD;  Location: [facility];  Service: Orthopedics;  Laterality: Left;  . INTRAOCULAR PROSTHESES INSERTION  1999   left  . TONGUE  BIOPSY  1998   "cancer"  . TUBAL LIGATION  1970's    Current Medications: Current Meds  Medication Sig  . acetaminophen (TYLENOL) 325 MG tablet Take 2 tablets (650 mg total) by mouth every 6 (six) hours as needed for mild pain or fever (or temp > 37.5 C (99.5 F)).  . ALPHAGAN P 0.1 % SOLN Place 1 drop into the right eye 2 (two) times daily.  Marland Kitchen amLODipine (NORVASC) 10 MG tablet Take 10 mg by mouth daily.  Marland Kitchen aspirin 81 MG tablet Take 81 mg by mouth daily.  Marland Kitchen atorvastatin (LIPITOR) 20 MG tablet take 1 tablet by mouth once daily (CHANGING FROM SIMVASTATIN)  . carvedilol (COREG) 12.5 MG tablet Take 12.5 mg by mouth 2 (two) times daily with a meal.  . folic acid (FOLVITE) 1 MG tablet Take 1 tablet (1 mg total) by mouth daily.  . furosemide (LASIX) 20 MG tablet TAKE 1 TABLET BY MOUTH ONCE DAILY  . gabapentin (NEURONTIN) 100 MG capsule Take 100 mg by mouth 3 (three) times daily.  Marland Kitchen HYDROcodone-acetaminophen (NORCO/VICODIN) 5-325 MG tablet Take 1/2 to 1 tablet every 6 hours as needed  . isosorbide mononitrate (IMDUR) 30 MG 24 hr tablet Take 30 mg by mouth daily.  . Multiple Vitamin (MULTIVITAMIN WITH MINERALS) TABS tablet Take 1  tablet by mouth daily.  . Nutritional Supplements (NUTRITIONAL SUPPLEMENT PO) NAS (No Salt Added) Regular texture, Regular / thin consistency  . omeprazole (PRILOSEC) 20 MG capsule Take 1 capsule (20 mg total) by mouth daily.  Marland Kitchen thiamine 100 MG tablet Take 1 tablet (100 mg total) by mouth daily.  . TRAVATAN Z 0.004 % SOLN ophthalmic solution Place 1 drop into the right eye at bedtime.      Allergies:   Patient has no known allergies.   Social History   Socioeconomic History  . Marital status: Single    Spouse name: Not on file  . Number of children: Not on file  . Years of education: Not on file  . Highest education level: Not on file  Occupational History  . Not on file  Social Needs  . Financial resource strain: Not on file  . Food insecurity:    Worry: Not  on file    Inability: Not on file  . Transportation needs:    Medical: Not on file    Non-medical: Not on file  Tobacco Use  . Smoking status: Former Smoker    Packs/day: 1.50    Years: 47.00    Pack years: 70.50    Types: Cigarettes    Last attempt to quit: 09/13/1997    Years since quitting: 20.4  . Smokeless tobacco: Never Used  Substance and Sexual Activity  . Alcohol use: Yes    Alcohol/week: 17.0 standard drinks    Types: 17 Shots of liquor per week    Comment: 06/02/2017 "1/5 rum q weekend"  . Drug use: No    Types: Marijuana, Heroin    Comment: 06/02/2017  "last drug use was in the 1980s"  . Sexual activity: Not Currently  Lifestyle  . Physical activity:    Days per week: Not on file    Minutes per session: Not on file  . Stress: Not on file  Relationships  . Social connections:    Talks on phone: Not on file    Gets together: Not on file    Attends religious service: Not on file    Active member of club or organization: Not on file    Attends meetings of clubs or organizations: Not on file    Relationship status: Not on file  Other Topics Concern  . Not on file  Social History Narrative  . Not on file     Family History: The patient's family history includes Diabetes type II in her father, mother, and sister. There is no history of Colon cancer.  ROS:   Please see the history of present illness.    Her intrinsic complaints other than cardiovascular include leg swelling, vision disturbance, all other systems reviewed and are negative.  EKGs/Labs/Other Studies Reviewed:    The following studies were reviewed today: Event monitor 07/08/2017: Study Highlights    NSR  PAC's  No atrial fibrillation, tachycardia, or pauses     2D Doppler echocardiogram 2019: Study Conclusions  - Left ventricle: The cavity size was normal. There was moderate   concentric hypertrophy. Systolic function was vigorous. The   estimated ejection fraction was in the range of  65% to 70%. Wall   motion was normal; there were no regional wall motion   abnormalities. Doppler parameters are consistent with abnormal   left ventricular relaxation (grade 1 diastolic dysfunction).   Doppler parameters are consistent with elevated ventricular   end-diastolic filling pressure. - Aortic valve: There was mild stenosis. There  was mild   regurgitation. Valve area (VTI): 1.69 cm^2. Valve area (Vmax):   1.56 cm^2. Valve area (Vmean): 1.63 cm^2. - Mitral valve: Calcified annulus. Mildly thickened leaflets .   Valve area by pressure half-time: 1.11 cm^2. - Left atrium: The atrium was mildly dilated. - Right ventricle: The cavity size was normal. Wall thickness was   normal. Systolic function was normal. - Right atrium: The atrium was normal in size. - Tricuspid valve: There was mild regurgitation. - Pulmonary arteries: Systolic pressure was within the normal   range. - Inferior vena cava: The vessel was normal in size. - Pericardium, extracardiac: There was no pericardial effusion.   Myocardial perfusion imaging 2019:  There was no ST segment deviation noted during stress.  Findings consistent with ischemia.  This is a low risk study.  The left ventricular ejection fraction is mildly decreased (45-54%).   Small area of inferior basal ischemia SDS 7 EF normal 54%    EKG:  EKG is not repeated today.  Recent Labs: 06/03/2017: ALT 52; Hemoglobin 13.7; Platelets 233 06/04/2017: Magnesium 1.8 06/10/2017: TSH 2.40 01/27/2018: BUN 15; Creatinine, Ser 1.14; NT-Pro BNP 719; Potassium 4.2; Sodium 141  Recent Lipid Panel    Component Value Date/Time   CHOL 154 06/03/2017 0505   TRIG 111 06/03/2017 0505   HDL 56 06/03/2017 0505   CHOLHDL 2.8 06/03/2017 0505   VLDL 22 06/03/2017 0505   LDLCALC 76 06/03/2017 0505    Physical Exam:    VS:  BP (!) 164/82   Pulse 72   Ht 5' (1.524 m)   Wt 146 lb (66.2 kg)   SpO2 96%   BMI 28.51 kg/m     Wt Readings from Last 3  Encounters:  01/27/18 146 lb (66.2 kg)  06/29/17 135 lb (61.2 kg)  06/15/17 134 lb (60.8 kg)     GEN: Appears older than stated age.. No acute distress HEENT: Normal NECK: No JVD. LYMPHATICS: No lymphadenopathy CARDIAC: RRR.  No murmur, gallop, edema VASCULAR: Pulses 2+ and symmetric in radial and carotids bilaterally., Bruits are absent in the patient's neck. RESPIRATORY:  Clear to auscultation without rales, wheezing or rhonchi  ABDOMEN: Soft, non-tender, non-distended, No pulsatile mass, MUSCULOSKELETAL: No deformity  SKIN: Warm and dry NEUROLOGIC:  Alert and oriented x 3 PSYCHIATRIC:  Normal affect   ASSESSMENT:    1. Chronic diastolic heart failure ([place])   2. Orthopnea   3. Essential hypertension   4. Type 2 diabetes mellitus with hyperosmolarity without coma, with long-term current use of insulin (HCC)   5. Paroxysmal atrial fibrillation ([place])   6. Cough   7. Encounter for pre-operative cardiovascular clearance   8. SOB (shortness of breath)    PLAN:    In order of problems listed above:  1. Major complaint is cough and clear phlegm production with 3 pillow orthopnea.  Will check a BNP and basic metabolic panel today. 2. BNP will help discern if there is volume overload. 3. Blood pressure control is not adequate.  May add Aldactone depending upon basic metabolic panel and and BNP.  Will otherwise need to optimize non-diuretic therapy such as carvedilol. 4. Needs hemoglobin A1c less than 7. 5. No clinical evidence of atrial fibrillation currently. 6. Cough may be local ENT.  Not on an ACE inhibitor.  Doubt that the cough is related to heart failure although evaluation will help sort it out. 7. Cleared for upcoming ENT procedure if needed.  We will call the patient with further  instructions after laboratory data is reviewed.   Medication Adjustments/Labs and Tests Ordered: Current medicines are reviewed at length with the patient today.  Concerns regarding medicines  are outlined above.  Orders Placed This Encounter  Procedures  . Basic metabolic panel  . Pro b natriuretic peptide (BNP)   No orders of the defined types were placed in this encounter.   Patient Instructions  Medication Instructions:  Your physician recommends that you continue on your current medications as directed. Please refer to the Current Medication list given to you today.  If you need a refill on your cardiac medications before your next appointment, please call your pharmacy.   Lab work: TODAY: BMET, BNP If you have labs (blood work) drawn today and your tests are completely normal, you will receive your results only by: Marland Kitchen MyChart Message (if you have MyChart) OR . A paper copy in the mail If you have any lab test that is abnormal or we need to change your treatment, we will call you to review the results.  Testing/Procedures: None ordered  Follow-Up: At [facility], you and your health needs are our priority.  As part of our continuing mission to provide you with exceptional heart care, we have created designated Provider Care Teams.  These Care Teams include your primary Cardiologist (physician) and Advanced Practice Providers (APPs -  Physician Assistants and Nurse Practitioners) who all work together to provide you with the care you need, when you need it. . You will need a follow up appointment in 8-9 [place] APP.   Please call our office 2 months in advance to schedule this appointment.  You may see one of the following Advanced Practice Providers on your designated Care Team:   . Truitt Merle, NP . Cecilie Kicks, NP . Kathyrn Drown, NP   Any Other Special Instructions Will Be Listed Below (If Applicable).     Signed, Sinclair Grooms, MD  02/03/2018 9:30 AM    [place]

## 2018-01-27 ENCOUNTER — Encounter: Payer: Self-pay | Admitting: Interventional Cardiology

## 2018-01-27 ENCOUNTER — Ambulatory Visit (INDEPENDENT_AMBULATORY_CARE_PROVIDER_SITE_OTHER): Payer: Medicare Other | Admitting: Interventional Cardiology

## 2018-01-27 ENCOUNTER — Encounter

## 2018-01-27 VITALS — BP 164/82 | HR 72 | Ht 60.0 in | Wt 146.0 lb

## 2018-01-27 DIAGNOSIS — I5032 Chronic diastolic (congestive) heart failure: Secondary | ICD-10-CM | POA: Diagnosis not present

## 2018-01-27 DIAGNOSIS — E1100 Type 2 diabetes mellitus with hyperosmolarity without nonketotic hyperglycemic-hyperosmolar coma (NKHHC): Secondary | ICD-10-CM

## 2018-01-27 DIAGNOSIS — I10 Essential (primary) hypertension: Secondary | ICD-10-CM | POA: Diagnosis not present

## 2018-01-27 DIAGNOSIS — R0601 Orthopnea: Secondary | ICD-10-CM

## 2018-01-27 DIAGNOSIS — Z01810 Encounter for preprocedural cardiovascular examination: Secondary | ICD-10-CM

## 2018-01-27 DIAGNOSIS — R0602 Shortness of breath: Secondary | ICD-10-CM

## 2018-01-27 DIAGNOSIS — Z794 Long term (current) use of insulin: Secondary | ICD-10-CM

## 2018-01-27 DIAGNOSIS — R059 Cough, unspecified: Secondary | ICD-10-CM

## 2018-01-27 DIAGNOSIS — R05 Cough: Secondary | ICD-10-CM

## 2018-01-27 DIAGNOSIS — I480 Paroxysmal atrial fibrillation: Secondary | ICD-10-CM

## 2018-01-27 NOTE — Patient Instructions (Signed)
Medication Instructions:  Your physician recommends that you continue on your current medications as directed. Please refer to the Current Medication list given to you today.  If you need a refill on your cardiac medications before your next appointment, please call your pharmacy.   Lab work: TODAY: BMET, BNP If you have labs (blood work) drawn today and your tests are completely normal, you will receive your results only by: Marland Kitchen MyChart Message (if you have MyChart) OR . A paper copy in the mail If you have any lab test that is abnormal or we need to change your treatment, we will call you to review the results.  Testing/Procedures: None ordered  Follow-Up: At [facility], you and your health needs are our priority.  As part of our continuing mission to provide you with exceptional heart care, we have created designated Provider Care Teams.  These Care Teams include your primary Cardiologist (physician) and Advanced Practice Providers (APPs -  Physician Assistants and Nurse Practitioners) who all work together to provide you with the care you need, when you need it. . You will need a follow up appointment in 8-9 [place] APP.   Please call our office 2 months in advance to schedule this appointment.  You may see one of the following Advanced Practice Providers on your designated Care Team:   . Truitt Merle, NP . Cecilie Kicks, NP . Kathyrn Drown, NP   Any Other Special Instructions Will Be Listed Below (If Applicable).

## 2018-01-28 ENCOUNTER — Telehealth: Payer: Self-pay | Admitting: *Deleted

## 2018-01-28 DIAGNOSIS — I5032 Chronic diastolic (congestive) heart failure: Secondary | ICD-10-CM

## 2018-01-28 LAB — BASIC METABOLIC PANEL
BUN / CREAT RATIO: 13 (ref 12–28)
BUN: 15 mg/dL (ref 8–27)
CO2: 22 mmol/L (ref 20–29)
CREATININE: 1.14 mg/dL — AB (ref 0.57–1.00)
Calcium: 9.3 mg/dL (ref 8.7–10.3)
Chloride: 105 mmol/L (ref 96–106)
GFR calc non Af Amer: 45 mL/min/{1.73_m2} — ABNORMAL LOW (ref >59)
GFR, EST AFRICAN AMERICAN: 51 mL/min/{1.73_m2} — AB (ref >59)
Glucose: 97 mg/dL (ref 65–99)
Potassium: 4.2 mmol/L (ref 3.5–5.2)
Sodium: 141 mmol/L (ref 134–144)

## 2018-01-28 LAB — PRO B NATRIURETIC PEPTIDE: NT-Pro BNP: 719 pg/mL (ref 0–738)

## 2018-01-28 MED ORDER — SPIRONOLACTONE 25 MG PO TABS: 25.0000 mg | ORAL_TABLET | Freq: Every day | ORAL | 3 refills | Status: DC

## 2018-01-28 NOTE — Telephone Encounter (Signed)
-----   Message from Belva Crome, MD sent at 01/28/2018  8:51 AM EST ----- Let the patient know breathing could be related to fluid buildup in the lungs.  Add Aldactone 25 mg/day.  Basic metabolic panel in 7 to 10 days.  She should have a clinical follow-up in 1 month if we did not schedule I at the office visit.  That visit will be to determine if the breathing is any better.  If it is, the additional diuretic will be continued.  If not, it will be discontinued. A copy will be sent to Dierdre Harness, FNP

## 2018-01-28 NOTE — Telephone Encounter (Signed)
Spoke with pt and went over results and recommendations per Dr. Tamala Julian.  Pt verbalized understanding and was in agreement with plan.  She will come for labs on 1/23 and Dr. Tamala Julian appt on 2/13.

## 2018-01-29 NOTE — Telephone Encounter (Addendum)
DR [facility] OFFICE CONTACTED TO SEE STATUS OF CLEARANCE. OFFICE WAS TOLD THE DR WILL BE CONTACTED TO CHECK THE STATUS ON THIS MATTER.  DR Tamala Julian SAW PT ON 01-27-18. MESSAGE WAS SENT VIA EPIC  TO  DR Tamala Julian  TO ADDENDED OFFICE NOTE TO STATE PT IS CLEAR FOR PROCEDURE.

## 2018-01-29 NOTE — Progress Notes (Signed)
What procedure?

## 2018-02-01 NOTE — Telephone Encounter (Signed)
Follow up    Pam with Dr. Lucia Gaskins office is calling in reference to clearance for patient.

## 2018-02-02 NOTE — Telephone Encounter (Signed)
Dr Tamala Julian, looking at your note from 01/27/2018 it appears Ms Glasscock is not cleared for laryngoscopy. Should I tell Dr Lucia Gaskins to cancel surgery for now?  Please respond to CV DIV PRE OP  Naz Denunzio PA-C 02/02/2018 10:25 AM

## 2018-02-04 ENCOUNTER — Other Ambulatory Visit: Payer: Medicare Other | Admitting: *Deleted

## 2018-02-04 DIAGNOSIS — I5032 Chronic diastolic (congestive) heart failure: Secondary | ICD-10-CM

## 2018-02-04 LAB — BASIC METABOLIC PANEL
BUN / CREAT RATIO: 10 — AB (ref 12–28)
BUN: 13 mg/dL (ref 8–27)
CHLORIDE: 100 mmol/L (ref 96–106)
CO2: 25 mmol/L (ref 20–29)
Calcium: 9.8 mg/dL (ref 8.7–10.3)
Creatinine, Ser: 1.31 mg/dL — ABNORMAL HIGH (ref 0.57–1.00)
GFR calc non Af Amer: 38 mL/min/{1.73_m2} — ABNORMAL LOW (ref >59)
GFR, EST AFRICAN AMERICAN: 43 mL/min/{1.73_m2} — AB (ref >59)
Glucose: 111 mg/dL — ABNORMAL HIGH (ref 65–99)
Potassium: 4.5 mmol/L (ref 3.5–5.2)
Sodium: 140 mmol/L (ref 134–144)

## 2018-02-08 ENCOUNTER — Telehealth: Payer: Self-pay

## 2018-02-08 NOTE — Telephone Encounter (Signed)
   [facility] Pre-operative Risk Assessment    Request for surgical clearance:  1. What type of surgery is being performed?  Direct Laryngoscopy with excision of vocal cord polyp   2. When is this surgery scheduled?  TBD   3. What type of clearance is required (medical clearance vs. Pharmacy clearance to hold med vs. Both)?  medical  4. Are there any medications that need to be held prior to surgery and how long?    5. Practice name and name of physician performing surgery? Dr Radene Journey   6. What is your office phone number [phone]    7.   What is your office fax number [phone]  8.   Anesthesia type (None, local, MAC, general) ?  general   [name] 02/08/2018, 11:14 AM  _________________________________________________________________   (provider comments below)

## 2018-02-08 NOTE — Telephone Encounter (Signed)
She is stable to proceed with surgery.

## 2018-02-08 NOTE — Telephone Encounter (Signed)
   Primary Cardiologist: Sinclair Grooms, MD  Chart reviewed as part of pre-operative protocol coverage. Patient was last seen on 02/04/2018 by Dr. Tamala Julian with complaints of SOB and cough. Her BNP was elevated at that time and Dr. Tamala Julian recommended adding aldactone. Given recent change in cardiac status and medication adjustments, patient was scheduled for close follow-up with Dr. Tamala Julian 02/25/2018.   Dr. Tamala Julian, can you readdress preoperative status at her next follow-up visit?   Abigail Butts, PA-C 02/08/2018, 1:29 PM

## 2018-02-09 NOTE — Telephone Encounter (Signed)
   Primary Cardiologist: Sinclair Grooms, MD  Chart reviewed as part of pre-operative protocol coverage. Per Dr. Tamala Julian, given past medical history and time since last visit, based on ACC/AHA guidelines, Nansi Birmingham would be at acceptable risk for the planned procedure without further cardiovascular testing.   I will route this recommendation, as well as the clinic note from Dr. Tamala Julian 01/27/2018, to the requesting party via [place] fax function and remove from pre-op pool.  Please call with questions.  Abigail Butts, PA-C 02/09/2018, 8:47 AM

## 2018-02-09 NOTE — Telephone Encounter (Signed)
Duplicate preop request. Please refer to telephone note 02/08/2018 regarding this preoperative assessment.   Abigail Butts, PA-C 02/09/18

## 2018-02-21 ENCOUNTER — Encounter (HOSPITAL_COMMUNITY): Payer: Self-pay | Admitting: Internal Medicine

## 2018-02-21 ENCOUNTER — Other Ambulatory Visit: Payer: Self-pay

## 2018-02-21 ENCOUNTER — Emergency Department (HOSPITAL_COMMUNITY): Payer: Medicare Other

## 2018-02-21 ENCOUNTER — Observation Stay (HOSPITAL_COMMUNITY)
Admission: EM | Admit: 2018-02-21 | Discharge: 2018-02-23 | Disposition: A | Discharge status: Home / Self Care | Payer: Medicare Other | Attending: Internal Medicine | Admitting: Internal Medicine

## 2018-02-21 DIAGNOSIS — I5032 Chronic diastolic (congestive) heart failure: Secondary | ICD-10-CM | POA: Diagnosis present

## 2018-02-21 DIAGNOSIS — N179 Acute kidney failure, unspecified: Secondary | ICD-10-CM | POA: Insufficient documentation

## 2018-02-21 DIAGNOSIS — Z96642 Presence of left artificial hip joint: Secondary | ICD-10-CM | POA: Diagnosis not present

## 2018-02-21 DIAGNOSIS — I129 Hypertensive chronic kidney disease with stage 1 through stage 4 chronic kidney disease, or unspecified chronic kidney disease: Secondary | ICD-10-CM

## 2018-02-21 DIAGNOSIS — Z87891 Personal history of nicotine dependence: Secondary | ICD-10-CM | POA: Diagnosis not present

## 2018-02-21 DIAGNOSIS — D631 Anemia in chronic kidney disease: Secondary | ICD-10-CM | POA: Insufficient documentation

## 2018-02-21 DIAGNOSIS — E1159 Type 2 diabetes mellitus with other circulatory complications: Secondary | ICD-10-CM | POA: Diagnosis present

## 2018-02-21 DIAGNOSIS — D72829 Elevated white blood cell count, unspecified: Secondary | ICD-10-CM | POA: Diagnosis not present

## 2018-02-21 DIAGNOSIS — E785 Hyperlipidemia, unspecified: Secondary | ICD-10-CM | POA: Diagnosis not present

## 2018-02-21 DIAGNOSIS — K219 Gastro-esophageal reflux disease without esophagitis: Secondary | ICD-10-CM | POA: Diagnosis present

## 2018-02-21 DIAGNOSIS — J101 Influenza due to other identified influenza virus with other respiratory manifestations: Principal | ICD-10-CM | POA: Diagnosis present

## 2018-02-21 DIAGNOSIS — J40 Bronchitis, not specified as acute or chronic: Secondary | ICD-10-CM | POA: Diagnosis present

## 2018-02-21 DIAGNOSIS — I130 Hypertensive heart and chronic kidney disease with heart failure and stage 1 through stage 4 chronic kidney disease, or unspecified chronic kidney disease: Secondary | ICD-10-CM | POA: Diagnosis not present

## 2018-02-21 DIAGNOSIS — Z833 Family history of diabetes mellitus: Secondary | ICD-10-CM | POA: Diagnosis not present

## 2018-02-21 DIAGNOSIS — Z79899 Other long term (current) drug therapy: Secondary | ICD-10-CM | POA: Insufficient documentation

## 2018-02-21 DIAGNOSIS — J209 Acute bronchitis, unspecified: Secondary | ICD-10-CM | POA: Diagnosis present

## 2018-02-21 DIAGNOSIS — Z8673 Personal history of transient ischemic attack (TIA), and cerebral infarction without residual deficits: Secondary | ICD-10-CM | POA: Insufficient documentation

## 2018-02-21 DIAGNOSIS — E1122 Type 2 diabetes mellitus with diabetic chronic kidney disease: Secondary | ICD-10-CM | POA: Diagnosis not present

## 2018-02-21 DIAGNOSIS — E1151 Type 2 diabetes mellitus with diabetic peripheral angiopathy without gangrene: Secondary | ICD-10-CM | POA: Diagnosis not present

## 2018-02-21 DIAGNOSIS — Z7982 Long term (current) use of aspirin: Secondary | ICD-10-CM | POA: Insufficient documentation

## 2018-02-21 DIAGNOSIS — N183 Chronic kidney disease, stage 3 unspecified: Secondary | ICD-10-CM | POA: Diagnosis present

## 2018-02-21 DIAGNOSIS — H5462 Unqualified visual loss, left eye, normal vision right eye: Secondary | ICD-10-CM | POA: Diagnosis not present

## 2018-02-21 DIAGNOSIS — E1169 Type 2 diabetes mellitus with other specified complication: Secondary | ICD-10-CM | POA: Diagnosis present

## 2018-02-21 LAB — POCT I-STAT EG7
ACID-BASE DEFICIT: 4 mmol/L — AB (ref 0.0–2.0)
Bicarbonate: 20.6 mmol/L (ref 20.0–28.0)
Calcium, Ion: 1.12 mmol/L — ABNORMAL LOW (ref 1.15–1.40)
HCT: 38 % (ref 36.0–46.0)
Hemoglobin: 12.9 g/dL (ref 12.0–15.0)
O2 Saturation: 90 %
PH VEN: 7.358 (ref 7.250–7.430)
Potassium: 4.7 mmol/L (ref 3.5–5.1)
Sodium: 139 mmol/L (ref 135–145)
TCO2: 22 mmol/L (ref 22–32)
pCO2, Ven: 36.6 mmHg — ABNORMAL LOW (ref 44.0–60.0)
pO2, Ven: 60 mmHg — ABNORMAL HIGH (ref 32.0–45.0)

## 2018-02-21 LAB — GLUCOSE, CAPILLARY
GLUCOSE-CAPILLARY: 229 mg/dL — AB (ref 70–99)
Glucose-Capillary: 205 mg/dL — ABNORMAL HIGH (ref 70–99)
Glucose-Capillary: 166 mg/dL — ABNORMAL HIGH (ref 70–99)

## 2018-02-21 LAB — COMPREHENSIVE METABOLIC PANEL
ALK PHOS: 73 U/L (ref 38–126)
ALT: 26 U/L (ref 0–44)
AST: 44 U/L — ABNORMAL HIGH (ref 15–41)
Albumin: 3.2 g/dL — ABNORMAL LOW (ref 3.5–5.0)
Anion gap: 12 (ref 5–15)
BUN: 22 mg/dL (ref 8–23)
CALCIUM: 8.9 mg/dL (ref 8.9–10.3)
CO2: 18 mmol/L — ABNORMAL LOW (ref 22–32)
Chloride: 108 mmol/L (ref 98–111)
Creatinine, Ser: 1.55 mg/dL — ABNORMAL HIGH (ref 0.44–1.00)
GFR calc Af Amer: 36 mL/min — ABNORMAL LOW (ref >60)
GFR calc non Af Amer: 31 mL/min — ABNORMAL LOW (ref >60)
GLUCOSE: 171 mg/dL — AB (ref 70–99)
Potassium: 4.9 mmol/L (ref 3.5–5.1)
Sodium: 138 mmol/L (ref 135–145)
Total Bilirubin: 0.6 mg/dL (ref 0.3–1.2)
Total Protein: 7.8 g/dL (ref 6.5–8.1)

## 2018-02-21 LAB — CBC WITH DIFFERENTIAL/PLATELET
Abs Immature Granulocytes: 0.04 10*3/uL (ref 0.00–0.07)
Basophils Absolute: 0 10*3/uL (ref 0.0–0.1)
Basophils Relative: 0 %
Eosinophils Absolute: 0.4 10*3/uL (ref 0.0–0.5)
Eosinophils Relative: 3 %
HCT: 41.6 % (ref 36.0–46.0)
Hemoglobin: 12.8 g/dL (ref 12.0–15.0)
Immature Granulocytes: 0 %
Lymphocytes Relative: 16 %
Lymphs Abs: 1.7 10*3/uL (ref 0.7–4.0)
MCH: 27.9 pg (ref 26.0–34.0)
MCHC: 30.8 g/dL (ref 30.0–36.0)
MCV: 90.6 fL (ref 80.0–100.0)
MONOS PCT: 4 %
Monocytes Absolute: 0.4 10*3/uL (ref 0.1–1.0)
Neutro Abs: 8 10*3/uL — ABNORMAL HIGH (ref 1.7–7.7)
Neutrophils Relative %: 77 %
Platelets: 253 10*3/uL (ref 150–400)
RBC: 4.59 MIL/uL (ref 3.87–5.11)
RDW: 14.2 % (ref 11.5–15.5)
WBC: 10.5 10*3/uL (ref 4.0–10.5)
nRBC: 0 % (ref 0.0–0.2)

## 2018-02-21 LAB — INFLUENZA PANEL BY PCR (TYPE A & B)
Influenza A By PCR: POSITIVE — AB
Influenza B By PCR: NEGATIVE

## 2018-02-21 LAB — CBG MONITORING, ED: Glucose-Capillary: 171 mg/dL — ABNORMAL HIGH (ref 70–99)

## 2018-02-21 LAB — TROPONIN I: Troponin I: <0.03 ng/mL (ref <0.03)

## 2018-02-21 LAB — BRAIN NATRIURETIC PEPTIDE: B Natriuretic Peptide: 45.6 pg/mL (ref 0.0–100.0)

## 2018-02-21 MED ORDER — IPRATROPIUM-ALBUTEROL 0.5-2.5 (3) MG/3ML IN SOLN
3.0000 mL | Freq: Four times a day (QID) | RESPIRATORY_TRACT | Status: DC
  Administered 2018-02-21 – 2018-02-22 (×5): 3 mL via RESPIRATORY_TRACT
  Filled 2018-02-21 (×5): qty 3

## 2018-02-21 MED ORDER — ENOXAPARIN SODIUM 30 MG/0.3ML ~~LOC~~ SOLN
30.0000 mg | SUBCUTANEOUS | Status: DC
  Administered 2018-02-21 – 2018-02-22 (×2): 30 mg via SUBCUTANEOUS
  Filled 2018-02-21 (×2): qty 0.3

## 2018-02-21 MED ORDER — SPIRONOLACTONE 25 MG PO TABS
25.0000 mg | ORAL_TABLET | Freq: Every day | ORAL | Status: DC
  Administered 2018-02-21 – 2018-02-22 (×2): 25 mg via ORAL
  Filled 2018-02-21 (×2): qty 1

## 2018-02-21 MED ORDER — PANTOPRAZOLE SODIUM 40 MG PO TBEC
40.0000 mg | DELAYED_RELEASE_TABLET | Freq: Every day | ORAL | Status: DC
  Administered 2018-02-21 – 2018-02-23 (×3): 40 mg via ORAL
  Filled 2018-02-21 (×3): qty 1

## 2018-02-21 MED ORDER — AMLODIPINE BESYLATE 10 MG PO TABS
10.0000 mg | ORAL_TABLET | Freq: Every day | ORAL | Status: DC
  Administered 2018-02-21 – 2018-02-23 (×3): 10 mg via ORAL
  Filled 2018-02-21 (×3): qty 1

## 2018-02-21 MED ORDER — ATORVASTATIN CALCIUM 10 MG PO TABS
20.0000 mg | ORAL_TABLET | Freq: Every day | ORAL | Status: DC
  Administered 2018-02-21 – 2018-02-22 (×2): 20 mg via ORAL
  Filled 2018-02-21 (×2): qty 2

## 2018-02-21 MED ORDER — ALBUTEROL (5 MG/ML) CONTINUOUS INHALATION SOLN
15.0000 mg/h | INHALATION_SOLUTION | RESPIRATORY_TRACT | Status: AC
  Administered 2018-02-21: 15 mg/h via RESPIRATORY_TRACT
  Filled 2018-02-21: qty 20

## 2018-02-21 MED ORDER — LATANOPROST 0.005 % OP SOLN
1.0000 [drp] | Freq: Every day | OPHTHALMIC | Status: DC
  Administered 2018-02-21 – 2018-02-22 (×2): 1 [drp] via OPHTHALMIC
  Filled 2018-02-21: qty 2.5

## 2018-02-21 MED ORDER — ACETAMINOPHEN 650 MG RE SUPP: 650.0000 mg | Freq: Four times a day (QID) | RECTAL | Status: DC | PRN

## 2018-02-21 MED ORDER — ACETAMINOPHEN 325 MG PO TABS: 650.0000 mg | ORAL_TABLET | Freq: Four times a day (QID) | ORAL | Status: DC | PRN

## 2018-02-21 MED ORDER — ALBUTEROL SULFATE (2.5 MG/3ML) 0.083% IN NEBU: 2.5000 mg | INHALATION_SOLUTION | RESPIRATORY_TRACT | Status: DC | PRN

## 2018-02-21 MED ORDER — METHYLPREDNISOLONE SODIUM SUCC 125 MG IJ SOLR
125.0000 mg | Freq: Once | INTRAMUSCULAR | Status: AC
  Administered 2018-02-21: 125 mg via INTRAVENOUS
  Filled 2018-02-21: qty 2

## 2018-02-21 MED ORDER — OSELTAMIVIR PHOSPHATE 75 MG PO CAPS
75.0000 mg | ORAL_CAPSULE | Freq: Once | ORAL | Status: AC
  Administered 2018-02-21: 75 mg via ORAL
  Filled 2018-02-21: qty 1

## 2018-02-21 MED ORDER — ACETAMINOPHEN 325 MG PO TABS
650.0000 mg | ORAL_TABLET | Freq: Once | ORAL | Status: AC
  Administered 2018-02-21: 650 mg via ORAL
  Filled 2018-02-21: qty 2

## 2018-02-21 MED ORDER — CARVEDILOL 12.5 MG PO TABS
12.5000 mg | ORAL_TABLET | Freq: Every day | ORAL | Status: DC
  Administered 2018-02-21 – 2018-02-23 (×3): 12.5 mg via ORAL
  Filled 2018-02-21 (×3): qty 1

## 2018-02-21 MED ORDER — OSELTAMIVIR PHOSPHATE 30 MG PO CAPS
30.0000 mg | ORAL_CAPSULE | Freq: Every day | ORAL | Status: DC
  Administered 2018-02-22 – 2018-02-23 (×2): 30 mg via ORAL
  Filled 2018-02-21 (×3): qty 1

## 2018-02-21 MED ORDER — LORATADINE 10 MG PO TABS
10.0000 mg | ORAL_TABLET | Freq: Every day | ORAL | Status: DC
  Administered 2018-02-21 – 2018-02-23 (×3): 10 mg via ORAL
  Filled 2018-02-21 (×3): qty 1

## 2018-02-21 MED ORDER — LISINOPRIL 40 MG PO TABS
40.0000 mg | ORAL_TABLET | Freq: Every day | ORAL | Status: DC
  Administered 2018-02-21 – 2018-02-22 (×2): 40 mg via ORAL
  Filled 2018-02-21 (×2): qty 1

## 2018-02-21 MED ORDER — FOLIC ACID 1 MG PO TABS
1.0000 mg | ORAL_TABLET | Freq: Every day | ORAL | Status: DC
  Administered 2018-02-21 – 2018-02-23 (×3): 1 mg via ORAL
  Filled 2018-02-21 (×3): qty 1

## 2018-02-21 MED ORDER — GABAPENTIN 300 MG PO CAPS
300.0000 mg | ORAL_CAPSULE | Freq: Two times a day (BID) | ORAL | Status: DC
  Administered 2018-02-21 – 2018-02-23 (×5): 300 mg via ORAL
  Filled 2018-02-21 (×5): qty 1

## 2018-02-21 MED ORDER — BRIMONIDINE TARTRATE 0.15 % OP SOLN
1.0000 [drp] | Freq: Three times a day (TID) | OPHTHALMIC | Status: DC
  Administered 2018-02-21 – 2018-02-23 (×5): 1 [drp] via OPHTHALMIC
  Filled 2018-02-21: qty 5

## 2018-02-21 MED ORDER — ALBUTEROL SULFATE (2.5 MG/3ML) 0.083% IN NEBU
5.0000 mg | INHALATION_SOLUTION | Freq: Once | RESPIRATORY_TRACT | Status: AC
  Administered 2018-02-21: 5 mg via RESPIRATORY_TRACT
  Filled 2018-02-21: qty 6

## 2018-02-21 MED ORDER — ADULT MULTIVITAMIN W/MINERALS CH
1.0000 | ORAL_TABLET | Freq: Every day | ORAL | Status: DC
  Administered 2018-02-21 – 2018-02-23 (×3): 1 via ORAL
  Filled 2018-02-21 (×3): qty 1

## 2018-02-21 MED ORDER — ASPIRIN EC 81 MG PO TBEC
81.0000 mg | DELAYED_RELEASE_TABLET | Freq: Every day | ORAL | Status: DC
  Administered 2018-02-21 – 2018-02-23 (×3): 81 mg via ORAL
  Filled 2018-02-21 (×3): qty 1

## 2018-02-21 NOTE — Progress Notes (Signed)
SATURATION QUALIFICATIONS: (This note is used to comply with regulatory documentation for home oxygen)  Patient Saturations on Room Air at Rest = 97%  Patient Saturations on Room Air while Ambulating = 93%  Pt ambulated 75 feet, no assisted devices used, pt tolerated well

## 2018-02-21 NOTE — ED Provider Notes (Signed)
Transferred to me.  The patient appears improved and is feeling better after the continuous albuterol treatment.  Still has some rhonchi but no significant increased work of breathing or accessory muscle use.  She will need admission to the hospital for treatment of the bronchitis/influenza. Dr. Sloan Leiter to admit.   Sherwood Gambler, MD 02/21/18 [phone]

## 2018-02-21 NOTE — ED Notes (Signed)
CBG 171, RN Tray informed

## 2018-02-21 NOTE — ED Triage Notes (Signed)
Pt arrived via [facility]; pt from hm wih c/o Sob, couching, increased temp x 1 wk; EMS reports wheezing on R upper/lower lobes; Pt rec'd 10mg  Albuterol, 0.5mg  Atrovent; 257/99, CBG 145, 97.5, 100% on Neb

## 2018-02-21 NOTE — Progress Notes (Signed)
Pt oriented to room, call bell within reach  Vitals documented  Admission database complete Provided hot tea and ordered lunch  Will continue to monitor

## 2018-02-21 NOTE — H&P (Signed)
History and Physical    [name] GUY:[id number] DOB: 1932/07/15 DOA: 02/21/2018  PCP: Dierdre Harness, FNP  Patient coming from: Home.  I have personally briefly reviewed patient's old medical records available.   Chief Complaint: Shortness of breath cough and wheezing.  HPI: [name] is a 85 y.o. female with medical history significant of hypertension, diabetes not on treatment, hyperlipidemia, GERD, previous smoker who presents to the emergency room with about 1 week of shortness of breath.  Patient does live alone and independent.  No sick contacts.  No recent travel.  She states that she does not have any history of primary lung disease.  She was previously a smoker.  Since about a week she started having cough and fever at home, she had shortness of breath on ambulation.  She had flulike symptoms, however she tried to take over-the-counter medications with no relief.  Today morning, patient continued to have worsening shortness of breath coughing and wheezing and was febrile so she called EMS and patient was brought to the ER.  She denies any nausea vomiting.  Does have poor appetite.  Denies any change in bowel or urine habits.  Denies any sputum production. ED Course: Febrile with temperature 101.  WBC count is normal.  Blood pressures are normal.  She received multiple doses of bronchodilator treatment with some improvement.  Influenza A was positive.  Creatinine 1.55, baseline creatinine about 1.3.  Chest x-ray is essentially normal.  Review of Systems: As per HPI otherwise 10 point review of systems negative.    Past Medical History:  Diagnosis Date  . Anemia 04/03/2011  . Anginal pain ([place])   . Anxiety   . Arthritis    "feet" (06/02/2017)  . Blind left eye 1999  . Depression   . Exertional dyspnea   . GERD (gastroesophageal reflux disease)   . High cholesterol   . Hip fracture ([place]) 07/17/11   fall from 1-2 feet; left  . Hypertension   . Ischemic stroke ([place])  06/02/2017   "weak moving around" (06/02/2017)  . Prosthetic eye globe 1999   "from diabetic; left eye"  . Tongue cancer ([place]) 1998   S/P radiation  . Type II diabetes mellitus ([place])    not taking any medication (06/02/2017)    Past Surgical History:  Procedure Laterality Date  . ENUCLEATION Left   . FRACTURE SURGERY    . HIP ARTHROPLASTY  07/18/2011   Procedure: ARTHROPLASTY BIPOLAR HIP;  Surgeon: Nita Sells, MD;  Location: [facility];  Service: Orthopedics;  Laterality: Left;  . INTRAOCULAR PROSTHESES INSERTION  1999   left  . TONGUE BIOPSY  1998   "cancer"  . TUBAL LIGATION  1970's     reports that she quit smoking about 20 years ago. Her smoking use included cigarettes. She has a 70.50 pack-year smoking history. She has never used smokeless tobacco. She reports current alcohol use of about 17.0 standard drinks of alcohol per week. She reports that she does not use drugs.  No Known Allergies  Family History  Problem Relation Age of Onset  . Diabetes type II Mother   . Diabetes type II Father   . Diabetes type II Sister   . Colon cancer Neg Hx      Prior to Admission medications   Medication Sig Start Date End Date Taking? Authorizing Provider  acetaminophen (TYLENOL) 325 MG tablet Take 2 tablets (650 mg total) by mouth every 6 (six) hours as needed for mild pain or  fever (or temp > 37.5 C (99.5 F)). 06/05/17  Yes Elgergawy, Silver Huguenin, MD  ALPHAGAN P 0.1 % SOLN Place 1 drop into the right eye 2 (two) times daily. 10/10/15  Yes [provider]  amLODipine (NORVASC) 10 MG tablet Take 10 mg by mouth daily.   Yes [provider]  aspirin 81 MG tablet Take 81 mg by mouth daily.   Yes [provider]  atorvastatin (LIPITOR) 20 MG tablet take 1 tablet by mouth once daily (CHANGING FROM SIMVASTATIN) Patient taking differently: Take 20 mg by mouth daily at 6 PM.  02/12/17  Yes Eileen Stanford, PA-C  carvedilol (COREG) 12.5 MG tablet Take 12.5 mg by  mouth See admin instructions. Take one daily if she does not feel better she will take another at about 2pm   Yes [provider]  folic acid (FOLVITE) 1 MG tablet Take 1 tablet (1 mg total) by mouth daily. 06/06/17  Yes Elgergawy, Silver Huguenin, MD  gabapentin (NEURONTIN) 100 MG capsule Take 100 mg by mouth as needed (pain).   Yes [provider]  gabapentin (NEURONTIN) 300 MG capsule Take 300 mg by mouth 2 (two) times daily.  05/08/17  Yes [provider]  lisinopril (PRINIVIL,ZESTRIL) 40 MG tablet Take 40 mg by mouth daily. 11/20/17  Yes [provider]  loratadine (CLARITIN) 10 MG tablet Take 10 mg by mouth daily. 01/20/18  Yes [provider]  Multiple Vitamin (MULTIVITAMIN WITH MINERALS) TABS tablet Take 1 tablet by mouth daily. 06/06/17  Yes Elgergawy, Silver Huguenin, MD  omeprazole (PRILOSEC) 20 MG capsule Take 1 capsule (20 mg total) by mouth daily. 08/21/17  Yes Belva Crome, MD  spironolactone (ALDACTONE) 25 MG tablet Take 1 tablet (25 mg total) by mouth daily. 01/28/18 04/28/18 Yes Belva Crome, MD  TRAVATAN Z 0.004 % SOLN ophthalmic solution Place 1 drop into the right eye at bedtime.  11/24/12  Yes [provider]    Physical Exam: Vitals:   02/21/18 0730 02/21/18 0731 02/21/18 0745 02/21/18 0755  BP: (!) 150/134  121/60 121/60  Pulse: 87  85 85  Resp: (!) 22  (!) 24 (!) 22  Temp:    (!) 101 F (38.3 C)  TempSrc:    Oral  SpO2: 100% 95% 99% 100%    Constitutional: NAD, calm, comfortable Vitals:   02/21/18 0730 02/21/18 0731 02/21/18 0745 02/21/18 0755  BP: (!) 150/134  121/60 121/60  Pulse: 87  85 85  Resp: (!) 22  (!) 24 (!) 22  Temp:    (!) 101 F (38.3 C)  TempSrc:    Oral  SpO2: 100% 95% 99% 100%   Eyes: PERRL, lids and conjunctivae normal ENMT: Mucous membranes are moist. Posterior pharynx clear of any exudate or lesions.Normal dentition.  Neck: normal, supple, no masses, no thyromegaly Respiratory: Occasional expiratory  wheezes.  No other added sounds.  no wheezing, no crackles. Normal respiratory effort. No accessory muscle use.  Currently on room air. Cardiovascular: Regular rate and rhythm, no murmurs / rubs / gallops. No extremity edema. 2+ pedal pulses. No carotid bruits.  Abdomen: no tenderness, no masses palpated. No hepatosplenomegaly. Bowel sounds positive.  Musculoskeletal: no clubbing / cyanosis. No joint deformity upper and lower extremities. Good ROM, no contractures. Normal muscle tone.  Skin: no rashes, lesions, ulcers. No induration Neurologic: CN 2-12 grossly intact. Sensation intact, DTR normal. Strength 5/5 in all 4.  Psychiatric: Normal judgment and insight. Alert and oriented x 3.  Normal mood.     Labs on Admission: I have personally reviewed following labs and imaging studies  CBC: Recent Labs  Lab 02/21/18 0610 02/21/18 0616  WBC 10.5  --   NEUTROABS 8.0*  --   HGB 12.8 12.9  HCT 41.6 38.0  MCV 90.6  --   PLT 253  --    Basic Metabolic Panel: Recent Labs  Lab 02/21/18 0610 02/21/18 0616  NA 138 139  K 4.9 4.7  CL 108  --   CO2 18*  --   GLUCOSE 171*  --   BUN 22  --   CREATININE 1.55*  --   CALCIUM 8.9  --    GFR: CrCl cannot be calculated (Unknown ideal weight.). Liver Function Tests: Recent Labs  Lab 02/21/18 0610  AST 44*  ALT 26  ALKPHOS 73  BILITOT 0.6  PROT 7.8  ALBUMIN 3.2*   No results for input(s): LIPASE, AMYLASE in the last 168 hours. No results for input(s): AMMONIA in the last 168 hours. Coagulation Profile: No results for input(s): INR, PROTIME in the last 168 hours. Cardiac Enzymes: Recent Labs  Lab 02/21/18 0610  TROPONINI <0.03   BNP (last 3 results) Recent Labs    01/27/18 1516  PROBNP 719   HbA1C: No results for input(s): HGBA1C in the last 72 hours. CBG: Recent Labs  Lab 02/21/18 0607  GLUCAP 171*   Lipid Profile: No results for input(s): CHOL, HDL, LDLCALC, TRIG, CHOLHDL, LDLDIRECT in the last 72 hours. Thyroid  Function Tests: No results for input(s): TSH, T4TOTAL, FREET4, T3FREE, THYROIDAB in the last 72 hours. Anemia Panel: No results for input(s): VITAMINB12, FOLATE, FERRITIN, TIBC, IRON, RETICCTPCT in the last 72 hours. Urine analysis:    Component Value Date/Time   COLORURINE YELLOW 06/02/2017 2110   APPEARANCEUR HAZY (A) 06/02/2017 2110   LABSPEC 1.005 06/02/2017 2110   PHURINE 7.0 06/02/2017 2110   GLUCOSEU NEGATIVE 06/02/2017 2110   HGBUR SMALL (A) 06/02/2017 2110   BILIRUBINUR NEGATIVE 06/02/2017 2110   KETONESUR NEGATIVE 06/02/2017 2110   PROTEINUR 100 (A) 06/02/2017 2110   UROBILINOGEN 0.2 02/07/2014 2241   NITRITE POSITIVE (A) 06/02/2017 2110   LEUKOCYTESUR MODERATE (A) 06/02/2017 2110    Radiological Exams on Admission: Dg Chest Port 1 View  Result Date: 02/21/2018 CLINICAL DATA:  Shortness of breath. EXAM: PORTABLE CHEST 1 VIEW COMPARISON:  06/02/2017 FINDINGS: The cardiac silhouette, mediastinal and hilar contours are within normal limits and stable given the AP projection and portable technique. There is moderate tortuosity and calcification of the thoracic aorta. The lungs are clear of an acute process. No infiltrates, edema or effusions. The bony thorax is intact. IMPRESSION: No acute cardiopulmonary findings. Electronically Signed   By: Marijo Sanes M.D.   On: 02/21/2018 06:20    EKG: Independently reviewed.  Sinus rhythm.  No ST-T wave changes.  Assessment/Plan Principal Problem:   Influenza A Active Problems:   Chronic diastolic heart failure (HCC)   Type 2 diabetes mellitus with vascular disease ([place])   Hypertension associated with stage 3 chronic kidney disease due to type 2 diabetes mellitus ([place])   Dyslipidemia associated with type 2 diabetes mellitus (HCC)   GERD without esophagitis   Wheezy bronchitis   Acute wheezy bronchitis     1.  Acute influenza A infection with wheezy bronchitis: Patient is symptomatic for about 1 week, however she continues to  have inflammatory symptoms including wheezing and shortness of breath.  She has temperature of 101.  Will treat with 5 days of Tamiflu dosed according to renal functions. Admit to hospital because of persistent wheezing.  Bronchodilator therapy with scheduled and as needed nebulizers.  Incentive spirometry, deep breathing exercises.  Chest physiotherapy.  Oxygen as needed.  2.  Chronic diastolic heart failure: Euvolemic.  Continue diuretics.  3.  Hypertension: Stable.  Resume home medications.  4.  Hyperlipidemia: On a statin.  Continue.  5.  GERD: On PPI.  Continue.  6.  AKI on CKD stage III, baseline creatinine about 1.3.  Will monitor.   DVT prophylaxis: Lovenox. Code Status: Full code. Family Communication: Granddaughter at the bedside. Disposition Plan: Home. Consults called: None. Admission status: Observation.   Barb Merino MD [facility] Pager [phone]  If 7PM-7AM, please contact night-coverage [email or web address] Password [facility]  02/21/2018, 10:32 AM

## 2018-02-21 NOTE — ED Provider Notes (Addendum)
[facility] EMERGENCY DEPARTMENT Provider Note   CSN: [id number] Arrival date & time: 02/21/18  0543     History   Chief Complaint Chief Complaint  Patient presents with  . Shortness of Breath    HPI [name] is a 85 y.o. female.  85yo F w/ PMH including HTN, CVA, HLD, CKD, T2DM, tongue cancer who p/w cough and SOB. PT reports 1 week of cough with subjective fevers at home and shortness of breath. No associated runny nose, sore throat, vomiting, or sick contacts. She reports compliance with medications. EMS noted wheezing and gave albuterol and atrovent in transport.   LEVEL 5 CAVEAT DUE TO RESPIRATORY DISTRESS  The history is provided by the patient.  Shortness of Breath    Past Medical History:  Diagnosis Date  . Anemia 04/03/2011  . Anginal pain ([place])   . Anxiety   . Arthritis    "feet" (06/02/2017)  . Blind left eye 1999  . Depression   . Exertional dyspnea   . GERD (gastroesophageal reflux disease)   . High cholesterol   . Hip fracture ([place]) 07/17/11   fall from 1-2 feet; left  . Hypertension   . Ischemic stroke ([place]) 06/02/2017   "weak moving around" (06/02/2017)  . Prosthetic eye globe 1999   "from diabetic; left eye"  . Tongue cancer ([place]) 1998   S/P radiation  . Type II diabetes mellitus ([place])    not taking any medication (06/02/2017)    Patient Active Problem List   Diagnosis Date Noted  . Hypertension associated with stage 3 chronic kidney disease due to type 2 diabetes mellitus ([place]) 06/09/2017  . Dyslipidemia associated with type 2 diabetes mellitus ([place]) 06/09/2017  . Stage 3 chronic kidney disease due to type 2 diabetes mellitus ([place]) 06/09/2017  . Peripheral sensory neuropathy due to type 2 diabetes mellitus ([place]) 06/09/2017  . GERD without esophagitis 06/09/2017  . Glaucoma due to type 2 diabetes mellitus ([place]) 06/09/2017  . Alcohol abuse 06/04/2017  . Ischemic stroke ([place]) 06/02/2017  . Type 2 diabetes mellitus with  vascular disease ([place]) 06/02/2017  . Chest pain 06/02/2017  . Closed non-physeal fracture of metatarsal bone of right foot 07/31/2016  . Hypertensive urgency 02/17/2014  . Chronic diastolic heart failure ([place]) 03/15/2013  . Closed left hip fracture ([place]) 07/17/2011    Past Surgical History:  Procedure Laterality Date  . ENUCLEATION Left   . FRACTURE SURGERY    . HIP ARTHROPLASTY  07/18/2011   Procedure: ARTHROPLASTY BIPOLAR HIP;  Surgeon: Nita Sells, MD;  Location: [facility];  Service: Orthopedics;  Laterality: Left;  . INTRAOCULAR PROSTHESES INSERTION  1999   left  . TONGUE BIOPSY  1998   "cancer"  . TUBAL LIGATION  1970's     OB History   No obstetric history on file.      Home Medications    Prior to Admission medications   Medication Sig Start Date End Date Taking? Authorizing Provider  acetaminophen (TYLENOL) 325 MG tablet Take 2 tablets (650 mg total) by mouth every 6 (six) hours as needed for mild pain or fever (or temp > 37.5 C (99.5 F)). 06/05/17   Elgergawy, Silver Huguenin, MD  ALPHAGAN P 0.1 % SOLN Place 1 drop into the right eye 2 (two) times daily. 10/10/15   [provider]  amLODipine (NORVASC) 10 MG tablet Take 10 mg by mouth daily.    [provider]  aspirin 81 MG tablet Take 81 mg  by mouth daily.    [provider]  atorvastatin (LIPITOR) 20 MG tablet take 1 tablet by mouth once daily (CHANGING FROM SIMVASTATIN) 02/12/17   Eileen Stanford, PA-C  carvedilol (COREG) 12.5 MG tablet Take 12.5 mg by mouth 2 (two) times daily with a meal.    [provider]  folic acid (FOLVITE) 1 MG tablet Take 1 tablet (1 mg total) by mouth daily. 06/06/17   Elgergawy, Silver Huguenin, MD  furosemide (LASIX) 20 MG tablet TAKE 1 TABLET BY MOUTH ONCE DAILY 06/23/17   Belva Crome, MD  gabapentin (NEURONTIN) 100 MG capsule Take 100 mg by mouth 3 (three) times daily. 05/08/17   [provider]  HYDROcodone-acetaminophen (NORCO/VICODIN) 5-325 MG  tablet Take 1/2 to 1 tablet every 6 hours as needed 06/15/17   Gerlene Fee, NP  isosorbide mononitrate (IMDUR) 30 MG 24 hr tablet Take 30 mg by mouth daily.    [provider]  Multiple Vitamin (MULTIVITAMIN WITH MINERALS) TABS tablet Take 1 tablet by mouth daily. 06/06/17   Elgergawy, Silver Huguenin, MD  Nutritional Supplements (NUTRITIONAL SUPPLEMENT PO) NAS (No Salt Added) Regular texture, Regular / thin consistency    [provider]  omeprazole (PRILOSEC) 20 MG capsule Take 1 capsule (20 mg total) by mouth daily. 08/21/17   Belva Crome, MD  spironolactone (ALDACTONE) 25 MG tablet Take 1 tablet (25 mg total) by mouth daily. 01/28/18 04/28/18  Belva Crome, MD  thiamine 100 MG tablet Take 1 tablet (100 mg total) by mouth daily. 06/06/17   Elgergawy, Silver Huguenin, MD  TRAVATAN Z 0.004 % SOLN ophthalmic solution Place 1 drop into the right eye at bedtime.  11/24/12   [provider]    Family History Family History  Problem Relation Age of Onset  . Diabetes type II Mother   . Diabetes type II Father   . Diabetes type II Sister   . Colon cancer Neg Hx     Social History Social History   Tobacco Use  . Smoking status: Former Smoker    Packs/day: 1.50    Years: 47.00    Pack years: 70.50    Types: Cigarettes    Last attempt to quit: 09/13/1997    Years since quitting: 20.4  . Smokeless tobacco: Never Used  Substance Use Topics  . Alcohol use: Yes    Alcohol/week: 17.0 standard drinks    Types: 17 Shots of liquor per week    Comment: 06/02/2017 "1/5 rum q weekend"  . Drug use: No    Types: Marijuana, Heroin    Comment: 06/02/2017  "last drug use was in the 1980s"     Allergies   Patient has no known allergies.   Review of Systems Review of Systems  Unable to perform ROS: Severe respiratory distress  Respiratory: Positive for shortness of breath.      Physical Exam Updated Vital Signs BP (!) 149/68   Pulse 90   Resp (!) 23   SpO2 95%   Physical  Exam Vitals signs and nursing note reviewed.  Constitutional:      General: She is not in acute distress.    Appearance: She is well-developed.  HENT:     Head: Normocephalic and atraumatic.  Eyes:     Conjunctiva/sclera: Conjunctivae normal.     Pupils: Pupils are equal, round, and reactive to light.  Neck:     Musculoskeletal: Neck supple.     Vascular: JVD present.  Cardiovascular:  Rate and Rhythm: Normal rate and regular rhythm.     Heart sounds: Murmur present.  Pulmonary:     Effort: Tachypnea, accessory muscle usage and respiratory distress present.     Comments: Mild respiratory distress w/ diminished breath sounds and end-expiratory wheezes and rhonchi b/l Abdominal:     General: Bowel sounds are normal. There is no distension.     Palpations: Abdomen is soft.     Tenderness: There is no abdominal tenderness.  Musculoskeletal:     Right lower leg: No edema.     Left lower leg: No edema.  Skin:    General: Skin is warm and dry.  Neurological:     Mental Status: She is alert and oriented to person, place, and time.     Comments: Fluent speech  Psychiatric:        Judgment: Judgment normal.      ED Treatments / Results  Labs (all labs ordered are listed, but only abnormal results are displayed) Labs Reviewed  COMPREHENSIVE METABOLIC PANEL - Abnormal; Notable for the following components:      Result Value   CO2 18 (*)    Glucose, Bld 171 (*)    Creatinine, Ser 1.55 (*)    Albumin 3.2 (*)    AST 44 (*)    GFR calc non Af Amer 31 (*)    GFR calc Af Amer 36 (*)    All other components within normal limits  CBC WITH DIFFERENTIAL/PLATELET - Abnormal; Notable for the following components:   Neutro Abs 8.0 (*)    All other components within normal limits  INFLUENZA PANEL BY PCR (TYPE A & B) - Abnormal; Notable for the following components:   Influenza A By PCR POSITIVE (*)    All other components within normal limits  CBG MONITORING, ED - Abnormal; Notable  for the following components:   Glucose-Capillary 171 (*)    All other components within normal limits  POCT I-STAT EG7 - Abnormal; Notable for the following components:   pCO2, Ven 36.6 (*)    pO2, Ven 60.0 (*)    Acid-base deficit 4.0 (*)    Calcium, Ion 1.12 (*)    All other components within normal limits  TROPONIN I  BRAIN NATRIURETIC PEPTIDE    EKG EKG Interpretation  Date/Time:  Sunday February 21 2018 06:08:03 EST Ventricular Rate:  92 PR Interval:    QRS Duration: 82 QT Interval:  327 QTC Calculation: 405 R Axis:   -4 Text Interpretation:  Sinus rhythm Left ventricular hypertrophy No significant change since last tracing Confirmed by Theotis Burrow [phone]) on 02/21/2018 7:04:08 AM   Radiology Dg Chest Port 1 View  Result Date: 02/21/2018 CLINICAL DATA:  Shortness of breath. EXAM: PORTABLE CHEST 1 VIEW COMPARISON:  06/02/2017 FINDINGS: The cardiac silhouette, mediastinal and hilar contours are within normal limits and stable given the AP projection and portable technique. There is moderate tortuosity and calcification of the thoracic aorta. The lungs are clear of an acute process. No infiltrates, edema or effusions. The bony thorax is intact. IMPRESSION: No acute cardiopulmonary findings. Electronically Signed   By: Marijo Sanes M.D.   On: 02/21/2018 06:20    Procedures .Critical Care Performed by: Sharlett Iles, MD Authorized by: Sharlett Iles, MD   Critical care provider statement:    Critical care time (minutes):  30   Critical care time was exclusive of:  Separately billable procedures and treating other patients   Critical care was necessary  to treat or prevent imminent or life-threatening deterioration of the following conditions:  Respiratory failure   Critical care was time spent personally by me on the following activities:  Development of treatment plan with patient or surrogate, evaluation of patient's response to treatment, examination of  patient, obtaining history from patient or surrogate, ordering and performing treatments and interventions, ordering and review of laboratory studies, ordering and review of radiographic studies and re-evaluation of patient's condition   (including critical care time)  Medications Ordered in ED Medications  albuterol (PROVENTIL,VENTOLIN) solution continuous neb (15 mg/hr Nebulization New Bag/Given 02/21/18 0731)  oseltamivir (TAMIFLU) capsule 75 mg (has no administration in time range)  methylPREDNISolone sodium succinate (SOLU-MEDROL) 125 mg/2 mL injection 125 mg (125 mg Intravenous Given 02/21/18 0633)  albuterol (PROVENTIL) (2.5 MG/3ML) 0.083% nebulizer solution 5 mg (5 mg Nebulization Given 02/21/18 0641)     Initial Impression / Assessment and Plan / ED Course  I have reviewed the triage vital signs and the nursing notes.  Pertinent labs & imaging results that were available during my care of the patient were reviewed by me and considered in my medical decision making (see chart for details).   PT in mild respiratory distress on arrival. No hx of COPD. DDx includes pneumonia, PE, pulmonary edema. No h/o CHF.  Lab work shows reassuring VBG, CMP with creatinine 1.55, normal CBC, influenza A positive.  Test x-ray is clear.  On reassessment, she continues to have dyspnea with accessory muscle use.  I have ordered an hour of continuous albuterol for ongoing wheezing as well as Tamiflu.  I am signing out to oncoming provider, Dr. Regenia Skeeter, to f/u after continuous albuterol.   Final Clinical Impressions(s) / ED Diagnoses   Final diagnoses:  None    ED Discharge Orders    None       Little, Wenda Overland, MD 02/21/18 [place], Wenda Overland, MD 02/21/18 [phone]

## 2018-02-22 ENCOUNTER — Telehealth: Payer: Self-pay | Admitting: *Deleted

## 2018-02-22 DIAGNOSIS — K219 Gastro-esophageal reflux disease without esophagitis: Secondary | ICD-10-CM

## 2018-02-22 DIAGNOSIS — E1159 Type 2 diabetes mellitus with other circulatory complications: Secondary | ICD-10-CM

## 2018-02-22 DIAGNOSIS — I5032 Chronic diastolic (congestive) heart failure: Secondary | ICD-10-CM

## 2018-02-22 DIAGNOSIS — E1169 Type 2 diabetes mellitus with other specified complication: Secondary | ICD-10-CM

## 2018-02-22 DIAGNOSIS — N183 Chronic kidney disease, stage 3 (moderate): Secondary | ICD-10-CM

## 2018-02-22 DIAGNOSIS — D72829 Elevated white blood cell count, unspecified: Secondary | ICD-10-CM | POA: Diagnosis not present

## 2018-02-22 DIAGNOSIS — N179 Acute kidney failure, unspecified: Secondary | ICD-10-CM

## 2018-02-22 DIAGNOSIS — N189 Chronic kidney disease, unspecified: Secondary | ICD-10-CM

## 2018-02-22 DIAGNOSIS — J101 Influenza due to other identified influenza virus with other respiratory manifestations: Secondary | ICD-10-CM | POA: Diagnosis not present

## 2018-02-22 DIAGNOSIS — I129 Hypertensive chronic kidney disease with stage 1 through stage 4 chronic kidney disease, or unspecified chronic kidney disease: Secondary | ICD-10-CM

## 2018-02-22 DIAGNOSIS — E785 Hyperlipidemia, unspecified: Secondary | ICD-10-CM

## 2018-02-22 DIAGNOSIS — E1122 Type 2 diabetes mellitus with diabetic chronic kidney disease: Secondary | ICD-10-CM

## 2018-02-22 DIAGNOSIS — J209 Acute bronchitis, unspecified: Secondary | ICD-10-CM | POA: Diagnosis not present

## 2018-02-22 LAB — BASIC METABOLIC PANEL
Anion gap: 9 (ref 5–15)
BUN: 32 mg/dL — AB (ref 8–23)
CHLORIDE: 106 mmol/L (ref 98–111)
CO2: 18 mmol/L — ABNORMAL LOW (ref 22–32)
Calcium: 8.7 mg/dL — ABNORMAL LOW (ref 8.9–10.3)
Creatinine, Ser: 1.93 mg/dL — ABNORMAL HIGH (ref 0.44–1.00)
GFR calc Af Amer: 27 mL/min — ABNORMAL LOW (ref >60)
GFR calc non Af Amer: 24 mL/min — ABNORMAL LOW (ref >60)
Glucose, Bld: 134 mg/dL — ABNORMAL HIGH (ref 70–99)
Potassium: 4.7 mmol/L (ref 3.5–5.1)
Sodium: 133 mmol/L — ABNORMAL LOW (ref 135–145)

## 2018-02-22 LAB — CBC
HCT: 33.8 % — ABNORMAL LOW (ref 36.0–46.0)
Hemoglobin: 10.8 g/dL — ABNORMAL LOW (ref 12.0–15.0)
MCH: 27.8 pg (ref 26.0–34.0)
MCHC: 32 g/dL (ref 30.0–36.0)
MCV: 87.1 fL (ref 80.0–100.0)
Platelets: 229 10*3/uL (ref 150–400)
RBC: 3.88 MIL/uL (ref 3.87–5.11)
RDW: 14.3 % (ref 11.5–15.5)
WBC: 15.7 10*3/uL — ABNORMAL HIGH (ref 4.0–10.5)
nRBC: 0 % (ref 0.0–0.2)

## 2018-02-22 LAB — HIV ANTIBODY (ROUTINE TESTING W REFLEX): HIV Screen 4th Generation wRfx: NONREACTIVE

## 2018-02-22 MED ORDER — SODIUM CHLORIDE 0.9 % IV SOLN
INTRAVENOUS | Status: AC
  Administered 2018-02-22: 11:00:00 via INTRAVENOUS

## 2018-02-22 NOTE — Evaluation (Signed)
Occupational Therapy Evaluation Patient Details Name: [name] MRN: [id number] DOB: 1932/09/21 Today's Date: 02/22/2018    History of Present Illness Arkie Tagliaferro is a 85 y.o. female with medical history significant of hypertension, diabetes not on treatment, hyperlipidemia, GERD, previous smoker who presents to the emergency room with about 1 week of shortness of breath. Pt dx with influenza/bronchitis   Clinical Impression   Pt presents to OT with reduced functional activity tolerance, reduced knowledge of AE/AD, dynamic balance impairments, and generalized weakness. Pt is a fall risk and has had several falls in her home in the past year. Session focused on educating pt and family on fall risk assessment and importance of safety in the home. Energy conservation, emergency call devices, fall risk reduction strategies, and CVA s/s all covered with multiple questions from family. Recommend OT follow acutely and [employer] f/u to complete fall risk assessment in the home.     Follow Up Recommendations  Home health OT;Supervision - Intermittent    Equipment Recommendations  Tub/shower bench    Recommendations for Other Services PT consult;Speech consult     Precautions / Restrictions Precautions Precautions: Fall Restrictions Weight Bearing Restrictions: No      Mobility Bed Mobility Overal bed mobility: Needs Assistance Bed Mobility: Supine to Sit     Supine to sit: Supervision     General bed mobility comments: No physical assist, cueing for activity pacing  Transfers Overall transfer level: Needs assistance   Transfers: Sit to/from Stand;Stand Pivot Transfers Sit to Stand: Supervision Stand pivot transfers: Min guard            Balance Overall balance assessment: Mild deficits observed, not formally tested                                         ADL either performed or assessed with clinical judgement   ADL Overall ADL's : Needs  assistance/impaired Eating/Feeding: Supervision/ safety;Sitting Eating/Feeding Details (indicate cue type and reason): cueing required for positioning to reduce aspiration risk Grooming: Wash/dry face;Wash/dry hands;Standing;Supervision/safety   Upper Body Bathing: Supervision/ safety;Sitting   Lower Body Bathing: Min guard;Sit to/from stand   Upper Body Dressing : Supervision/safety;Sitting   Lower Body Dressing: Min guard;Sit to/from stand;Cueing for safety   Toilet Transfer: Nature conservation officer;Ambulation;Cueing for safety   Toileting- Clothing Manipulation and Hygiene: Min guard;Sit to/from stand       Functional mobility during ADLs: Min guard General ADL Comments: Min guard throughout without AD, heavy furniture walker     Vision Baseline Vision/History: Wears glasses(False L eye) Wears Glasses: At all times Patient Visual Report: No change from baseline Vision Assessment?: No apparent visual deficits Additional Comments: No change from baseline            Pertinent Vitals/Pain Pain Assessment: No/denies pain     Hand Dominance Right   Extremity/Trunk Assessment Upper Extremity Assessment Upper Extremity Assessment: Generalized weakness   Lower Extremity Assessment Lower Extremity Assessment: Defer to PT evaluation   Cervical / Trunk Assessment Cervical / Trunk Assessment: Kyphotic   Communication Communication Communication: No difficulties   Cognition Arousal/Alertness: Awake/alert Behavior During Therapy: WFL for tasks assessed/performed Overall Cognitive Status: Within Functional Limits for tasks assessed                                 General Comments: Pt  reports slight memory deficits since "multiple mini strokes"   General Comments  granddaughter and great granddaughter present during session            [place] expects to be discharged to:: Private residence Living Arrangements: Alone Available Help  at Discharge: Family;Available PRN/intermittently(granddaughter, great granddaughter) Type of Home: House Home Access: Stairs to enter [employer] of Steps: 2 Entrance Stairs-Rails: None Home Layout: One level     Bathroom Shower/Tub: Teacher, early years/pre: Standard     Home Equipment: Cane - single point;Shower seat;Bedside commode          Prior Functioning/Environment Level of Independence: Independent        Comments: Pt reports she furniture walks in the house and uses cane in the community, pt reports several falls in the past year        OT Problem List: Decreased activity tolerance;Decreased safety awareness;Decreased cognition;Decreased knowledge of use of DME or AE;Impaired balance (sitting and/or standing);Impaired vision/perception;Decreased knowledge of precautions      OT Treatment/Interventions: Self-care/ADL training;Therapeutic exercise;Energy conservation;DME and/or AE instruction;Therapeutic activities;Patient/family education;Balance training    OT Goals(Current goals can be found in the care plan section) Acute Rehab OT Goals Patient Stated Goal: get back home OT Goal Formulation: With patient/family Time For Goal Achievement: 03/04/18 Potential to Achieve Goals: Good  OT Frequency: Min 2X/week    AM-PAC OT "6 Clicks" Daily Activity     Outcome Measure Help from another person eating meals?: None Help from another person taking care of personal grooming?: A Little Help from another person toileting, which includes using toliet, bedpan, or urinal?: A Little Help from another person bathing (including washing, rinsing, drying)?: A Little Help from another person to put on and taking off regular upper body clothing?: None Help from another person to put on and taking off regular lower body clothing?: A Little 6 Click Score: 20   End of Session    Activity Tolerance: Patient tolerated treatment well Patient left: in bed;with  call bell/phone within reach;with family/visitor present  OT Visit Diagnosis: Unsteadiness on feet (R26.81);Repeated falls (R29.6);Muscle weakness (generalized) (M62.81);History of falling (Z91.81)                Time: 4492-0100 OT Time Calculation (min): 25 min Charges:  OT General Charges $OT Visit: 1 Visit OT Evaluation $OT Eval Low Complexity: 1 Low OT Treatments $Self Care/Home Management : 8-22 mins  Curtis Sites OTR/L  02/22/2018, 9:07 AM

## 2018-02-22 NOTE — Progress Notes (Signed)
[facility] PROGRESS NOTE  [name] KYH:[id number] DOB: July 07, 1932 DOA: 02/21/2018 PCP: Dierdre Harness, FNP  Assessment/Plan:  1.  Acute influenza A infection with wheezy bronchitis: Much improved this am. Complains generalized weakness and decreased appetite. Afebrile for 24 hours. Not hypoxic. Non-toxic appearing -continue Tamiflu day #2. - Bronchodilator therapy with scheduled and as needed nebulizers. -  Incentive spirometry, deep breathing exercises.  - Oxygen as needed.  2.  Chronic diastolic heart failure:  remains Euvolemic.  - -Continue diuretics. -obtain daily weight -monitor intake and output  3.  Hypertension: Stable.  Resume home medications.  4.  Hyperlipidemia: On a statin.  Continue.  5.  GERD: On PPI.  Continue.  6.  AKI on CKD stage III, baseline creatinine about 1.3. trending up to 1.9 this am. Likely related to decreased oral intake. -provide small amount IV fluids gently -hold nephrotoxins (lisinopril) -monitor urine - Will monitor.  7. Leukocytosis. Likely related to solumedrol yesterday in setting of acute illness. See #1   Code Status: full Family Communication: great grand-daughter Disposition Plan: home in am   Consultants:    Procedures:    Antibiotics:  HPI/Subjective: Sitting up in bed watching tv. Denies pain/discomfort. Complains cough non productive  85 yo, independent, lives alone admitted influenza A. Improving this am. Likely home in am  Objective: Vitals:   02/22/18 0019 02/22/18 0554  BP: 124/81 126/67  Pulse: 64 64  Resp: 16 17  Temp: 98.5 F (36.9 C) 97.8 F (36.6 C)  SpO2: 97% 93%    Intake/Output Summary (Last 24 hours) at 02/22/2018 1100 Last data filed at 02/22/2018 0800 Gross per 24 hour  Intake 480 ml  Output -  Net 480 ml   Filed Weights   02/21/18 1112  Weight: 54.4 kg    Exam:   General:  Awake alert in no acute distress  Cardiovascular: rrr no mgr no LE  edema  Respiratory: normal effort BS coarse throughout good air movement  Abdomen: obese soft +BS no guarding or rebounding  Musculoskeletal: joints without swelling/erythema   Data Reviewed: Basic Metabolic Panel: Recent Labs  Lab 02/21/18 0610 02/21/18 0616 02/22/18 0354  NA 138 139 133*  K 4.9 4.7 4.7  CL 108  --  106  CO2 18*  --  18*  GLUCOSE 171*  --  134*  BUN 22  --  32*  CREATININE 1.55*  --  1.93*  CALCIUM 8.9  --  8.7*   Liver Function Tests: Recent Labs  Lab 02/21/18 0610  AST 44*  ALT 26  ALKPHOS 73  BILITOT 0.6  PROT 7.8  ALBUMIN 3.2*   No results for input(s): LIPASE, AMYLASE in the last 168 hours. No results for input(s): AMMONIA in the last 168 hours. CBC: Recent Labs  Lab 02/21/18 0610 02/21/18 0616 02/22/18 0354  WBC 10.5  --  15.7*  NEUTROABS 8.0*  --   --   HGB 12.8 12.9 10.8*  HCT 41.6 38.0 33.8*  MCV 90.6  --  87.1  PLT 253  --  229   Cardiac Enzymes: Recent Labs  Lab 02/21/18 0610  TROPONINI <0.03   BNP (last 3 results) Recent Labs    02/21/18 0610  BNP 45.6    ProBNP (last 3 results) Recent Labs    01/27/18 1516  PROBNP 719    CBG: Recent Labs  Lab 02/21/18 0607 02/21/18 1211 02/21/18 1644 02/21/18 2111  GLUCAP 171* 229* 205* 166*    No results found for  this or any previous visit (from the past 240 hour(s)).   Studies: Dg Chest Port 1 View  Result Date: 02/21/2018 CLINICAL DATA:  Shortness of breath. EXAM: PORTABLE CHEST 1 VIEW COMPARISON:  06/02/2017 FINDINGS: The cardiac silhouette, mediastinal and hilar contours are within normal limits and stable given the AP projection and portable technique. There is moderate tortuosity and calcification of the thoracic aorta. The lungs are clear of an acute process. No infiltrates, edema or effusions. The bony thorax is intact. IMPRESSION: No acute cardiopulmonary findings. Electronically Signed   By: Marijo Sanes M.D.   On: 02/21/2018 06:20    Scheduled Meds: .  amLODipine  10 mg Oral Daily  . aspirin EC  81 mg Oral Daily  . atorvastatin  20 mg Oral q1800  . brimonidine  1 drop Right Eye TID  . carvedilol  12.5 mg Oral Daily  . enoxaparin (LOVENOX) injection  30 mg Subcutaneous Q24H  . folic acid  1 mg Oral Daily  . gabapentin  300 mg Oral BID  . ipratropium-albuterol  3 mL Nebulization Q6H  . latanoprost  1 drop Right Eye QHS  . lisinopril  40 mg Oral Daily  . loratadine  10 mg Oral Daily  . multivitamin with minerals  1 tablet Oral Daily  . oseltamivir  30 mg Oral Daily  . pantoprazole  40 mg Oral Daily  . spironolactone  25 mg Oral Daily   Continuous Infusions: . sodium chloride      Principal Problem:   Influenza A Active Problems:   Wheezy bronchitis   Acute wheezy bronchitis   Chronic diastolic heart failure (HCC)   Type 2 diabetes mellitus with vascular disease (HCC)   Hypertension associated with stage 3 chronic kidney disease due to type 2 diabetes mellitus ([place])   Dyslipidemia associated with type 2 diabetes mellitus ([place])   GERD without esophagitis   Leukocytosis    Time spent: 25 minutes    [place] NP  [facility]  If 7PM-7AM, please contact night-coverage at [email or web address], password [facility] 02/22/2018, 11:00 AM  LOS: 0 days

## 2018-02-22 NOTE — Care Management Note (Signed)
Case Management Note  Patient Details  Name: [name] MRN: [id number] Date of Birth: 06/23/1932  Subjective/Objective: 85 yo female presented with influenza/bronchitis.                   Action/Plan: CM met with patient to discuss dispositional needs and PT/OT recommendations. Patient indicated living at home alone and being independent with her ADLs; ambulating with a rollator, cane and has a BSC. PCP verified as: Dr. Cynthia Warden; pharmacy of choice: [facility]. PT/OT recommended HHPT/OT with 24/7 supervision for the first 2 days. CMS HH compare list was provided with [facility] selected; patient states she will discuss 24/7 assist with her granddaughter later today. HH referral given to Cory RN, [facility]; AVS updated. Patients family will provide transportation home. No further needs from CM.   Expected Discharge Date:                  Expected Discharge Plan:  Home w Home Health Services  In-House Referral:  NA  Discharge planning Services  CM Consult  Post Acute Care Choice:  Home Health Choice offered to:  Patient  DME Arranged:  N/A DME Agency:  NA  HH Arranged:  PT, OT HH Agency:  [facility]  Status of Service:  Completed, signed off  If discussed at Long Length of Stay Meetings, dates discussed:    Additional Comments:  Natalie Gay RN, BSN, NCM-BC, ACM-RN [phone] 02/22/2018, 3:21 PM  

## 2018-02-22 NOTE — Telephone Encounter (Signed)
   [facility] Pre-operative Risk Assessment    Request for surgical clearance:  1. What type of surgery is being performed? DIRECT LARYNGOSCOPY W/EXCISION OF VOCAL CORD POLYP   2. When is this surgery scheduled? TBD   3. What type of clearance is required (medical clearance vs. Pharmacy clearance to hold med vs. Both)? MEDICAL  4. Are there any medications that need to be held prior to surgery and how long?NONE LISTED THOUGH PT IS ON ASA  5. Practice name and name of physician performing surgery? DR. Gerald Stabs [facility]   6. What is your office phone number [phone]    7.   What is your office fax number [phone]  8.   Anesthesia type (None, local, MAC, general) ? GENERAL   Julaine Hua 02/22/2018, 2:37 PM  _________________________________________________________________   (provider comments below)

## 2018-02-22 NOTE — Evaluation (Signed)
Physical Therapy Evaluation Patient Details Name: [name] MRN: [id number] DOB: 1932-06-17 Today's Date: 02/22/2018   History of Present Illness  [name] is a 85 y.o. female with medical history significant of hypertension, diabetes not on treatment, hyperlipidemia, GERD, previous smoker who presents to the emergency room with about 1 week of shortness of breath. Pt dx with influenza/bronchitis  Clinical Impression  Pt admitted with above. Pt feeling better and tolerated ambulation well with pushing IV pole. Discussed asking for more assist with household tasks like cooking and cleaning initially due to being very deconditioned from being ill. Pt agreed. Acute PT to cont to follow.    Follow Up Recommendations Supervision - Intermittent;Home health PT(24/7 for first 2 days)    Equipment Recommendations  None recommended by PT    Recommendations for Other Services       Precautions / Restrictions Precautions Precautions: Fall Restrictions Weight Bearing Restrictions: No      Mobility  Bed Mobility Overal bed mobility: Needs Assistance Bed Mobility: Supine to Sit     Supine to sit: Supervision     General bed mobility comments: HOB elevated, no physical assist needed  Transfers Overall transfer level: Needs assistance Equipment used: None Transfers: Sit to/from Stand;Stand Pivot Transfers Sit to Stand: Supervision Stand pivot transfers: Min guard       General transfer comment: min guard due to first time up  Ambulation/Gait Ambulation/Gait assistance: Min guard Gait Distance (Feet): 150 Feet Assistive device: IV Pole Gait Pattern/deviations: Step-through pattern Gait velocity: decreased Gait velocity interpretation: 1.31 - 2.62 ft/sec, indicative of limited community ambulator General Gait Details: mildly guarded, no overt LOB, slow but expected for age and having the FLU  Stairs            Wheelchair Mobility    Modified Rankin  (Stroke Patients Only)       Balance Overall balance assessment: Mild deficits observed, not formally tested                                           Pertinent Vitals/Pain Pain Assessment: No/denies pain    Home Living Family/patient expects to be discharged to:: Private residence Living Arrangements: Alone Available Help at Discharge: Family;Available PRN/intermittently(granddaughter, great granddaughter) Type of Home: House Home Access: Stairs to enter Entrance Stairs-Rails: None Entrance Stairs-Number of Steps: 2 Home Layout: One level Home Equipment: Cane - single point;Shower seat;Bedside commode Additional Comments: pt has a friend to take her to the grocery store, granddaughter takes her to church    Prior Function Level of Independence: Independent         Comments: pt reports she uses a quad cane     Hand Dominance   Dominant Hand: Right    Extremity/Trunk Assessment   Upper Extremity Assessment Upper Extremity Assessment: Generalized weakness    Lower Extremity Assessment Lower Extremity Assessment: Generalized weakness    Cervical / Trunk Assessment Cervical / Trunk Assessment: Kyphotic  Communication   Communication: No difficulties  Cognition Arousal/Alertness: Awake/alert Behavior During Therapy: WFL for tasks assessed/performed Overall Cognitive Status: Within Functional Limits for tasks assessed                                        General Comments General comments (skin integrity, edema, etc.): VSS, some  coughing but otherewise reports feeling better than she did    Exercises     Assessment/Plan    PT Assessment Patent does not need any further PT services  PT Problem List         PT Treatment Interventions      PT Goals (Current goals can be found in the Care Plan section)  Acute Rehab PT Goals Patient Stated Goal: get back home PT Goal Formulation: With patient Time For Goal  Achievement: 03/08/18 Potential to Achieve Goals: Good    Frequency     Barriers to discharge        Co-evaluation               AM-PAC PT "6 Clicks" Mobility  Outcome Measure Help needed turning from your back to your side while in a flat bed without using bedrails?: None Help needed moving from lying on your back to sitting on the side of a flat bed without using bedrails?: None Help needed moving to and from a bed to a chair (including a wheelchair)?: A Little Help needed standing up from a chair using your arms (e.g., wheelchair or bedside chair)?: A Little Help needed to walk in hospital room?: A Little Help needed climbing 3-5 steps with a railing? : A Little 6 Click Score: 20    End of Session Equipment Utilized During Treatment: Gait belt Activity Tolerance: Patient tolerated treatment well Patient left: in chair;with call bell/phone within reach Nurse Communication: Mobility status(desire for a home health aide) PT Visit Diagnosis: Muscle weakness (generalized) (M62.81)    Time: 6226-3335 PT Time Calculation (min) (ACUTE ONLY): 15 min   Charges:   PT Evaluation $PT Eval Low Complexity: 1 Low          Kittie Plater, PT, DPT Acute Rehabilitation Services Pager #: [phone] Office #: [phone]   [place] Lopes 02/22/2018, 1:11 PM

## 2018-02-22 NOTE — Care Management Obs Status (Signed)
[facility] NOTIFICATION   Patient Details  Name: Melaney Tellefsen MRN: [id number] Date of Birth: May 23, 1932   Medicare Observation Status Notification Given:  Yes  Permission given by patient to sign d/t isolation status.   Georgeanna Lea, RN 02/22/2018, 3:14 PM

## 2018-02-23 DIAGNOSIS — J209 Acute bronchitis, unspecified: Secondary | ICD-10-CM | POA: Diagnosis not present

## 2018-02-23 DIAGNOSIS — E1169 Type 2 diabetes mellitus with other specified complication: Secondary | ICD-10-CM | POA: Diagnosis not present

## 2018-02-23 DIAGNOSIS — I5032 Chronic diastolic (congestive) heart failure: Secondary | ICD-10-CM | POA: Diagnosis not present

## 2018-02-23 DIAGNOSIS — J101 Influenza due to other identified influenza virus with other respiratory manifestations: Secondary | ICD-10-CM | POA: Diagnosis not present

## 2018-02-23 LAB — BASIC METABOLIC PANEL
Anion gap: 9 (ref 5–15)
BUN: 29 mg/dL — ABNORMAL HIGH (ref 8–23)
CALCIUM: 8.9 mg/dL (ref 8.9–10.3)
CO2: 20 mmol/L — ABNORMAL LOW (ref 22–32)
Chloride: 105 mmol/L (ref 98–111)
Creatinine, Ser: 1.48 mg/dL — ABNORMAL HIGH (ref 0.44–1.00)
GFR calc Af Amer: 38 mL/min — ABNORMAL LOW (ref >60)
GFR calc non Af Amer: 32 mL/min — ABNORMAL LOW (ref >60)
Glucose, Bld: 78 mg/dL (ref 70–99)
Potassium: 4.9 mmol/L (ref 3.5–5.1)
Sodium: 134 mmol/L — ABNORMAL LOW (ref 135–145)

## 2018-02-23 MED ORDER — IBUPROFEN 200 MG PO TABS
200.0000 mg | ORAL_TABLET | Freq: Once | ORAL | Status: AC
  Administered 2018-02-23: 200 mg via ORAL
  Filled 2018-02-23: qty 1

## 2018-02-23 MED ORDER — OSELTAMIVIR PHOSPHATE 30 MG PO CAPS: 30.0000 mg | ORAL_CAPSULE | Freq: Every day | ORAL | 0 refills | Status: DC

## 2018-02-23 MED ORDER — AMOXICILLIN-POT CLAVULANATE 500-125 MG PO TABS: 1.0000 | ORAL_TABLET | Freq: Two times a day (BID) | ORAL | Status: DC

## 2018-02-23 MED ORDER — AMOXICILLIN-POT CLAVULANATE 500-125 MG PO TABS
1.0000 | ORAL_TABLET | Freq: Three times a day (TID) | ORAL | Status: DC
  Administered 2018-02-23: 500 mg via ORAL
  Filled 2018-02-23: qty 1

## 2018-02-23 MED ORDER — AMOXICILLIN-POT CLAVULANATE 500-125 MG PO TABS: 1.0000 | ORAL_TABLET | Freq: Two times a day (BID) | ORAL | 0 refills | Status: AC

## 2018-02-23 NOTE — Progress Notes (Signed)
PHARMACY NOTE:  ANTIMICROBIAL RENAL DOSAGE ADJUSTMENT  Current antimicrobial regimen includes a mismatch between antimicrobial dosage and estimated renal function.  As per policy approved by the Pharmacy & Therapeutics and Medical Executive Committees, the antimicrobial dosage will be adjusted accordingly.  Current antimicrobial dosage:  augmentin 500mg  TID  Renal Function:  Estimated Creatinine Clearance: 24.3 mL/min (A) (by C-G formula based on SCr of 1.48 mg/dL (H)). []      On intermittent HD, scheduled: []      On CRRT    Antimicrobial dosage has been changed to:  Augmentin 500mg  PO q12h  Additional comments:  Garrin Kirwan A. Levada Dy, PharmD, [place] Pager: [phone] Please utilize Amion for appropriate phone number to reach the unit pharmacist ([place])    02/23/2018 9:29 AM

## 2018-02-23 NOTE — Discharge Summary (Signed)
Physician Discharge Summary  Ritamarie Arkin CWC:[id number] DOB: Jan 27, 1932 DOA: 02/21/2018  PCP: Dierdre Harness, FNP  Admit date: 02/21/2018 Discharge date: 02/23/2018  Time spent: 45 minutes  Recommendations for Outpatient Follow-up:  1. Follow up with PCP 1-2 weeks for evaluation of symptoms. Recommend BMET to track kidney function 2. Keep appointment with dr Tamala Julian noted to be 2/13.  Discharge Diagnoses:  Principal Problem:   Influenza A Active Problems:   Wheezy bronchitis   Acute wheezy bronchitis   Chronic diastolic heart failure (HCC)   Type 2 diabetes mellitus with vascular disease ([place])   Hypertension associated with stage 3 chronic kidney disease due to type 2 diabetes mellitus ([place])   Dyslipidemia associated with type 2 diabetes mellitus ([place])   GERD without esophagitis   Leukocytosis   Discharge Condition: stable  Diet recommendation: heart healthy carb modified  Filed Weights   02/21/18 1112 02/23/18 0640  Weight: 54.4 kg 65.5 kg    History of present illness:  Tifini Reeder is a 85 y.o. female with a Past Medical History of HTN, HLD, DM type II, GERD, and anemia; who presented 02/21/18 with shortness of breath found to be positive for influenza A despite receiving flu vaccine. Provided with tamiflue, anti-tussive, nebs as well as IV fluids.   Hospital Course:   1.Acute influenza A infection with wheezy bronchitis:  Afebrile for 48 hours at discharge. Not hypoxic. Non-toxic appearing. Tamiflu day #3Bronchodilator therapy with. Continues with cough. Will discharge with augmenten as some concern for development of post influenza pneumonia developing. Close follow up with PCP  2.Chronic diastolic heart failure: remained Euvolemic. Has appointment with cards 02/25/18  3.Hypertension: Stable.   4.Hyperlipidemia: On a statin.   5.GERD: stable.  6. AKI on CKD stage III,baseline creatinine about 1.3. trending up to 1.9 this am. Likely  related to decreased oral intake. -provided small amount IV fluids gently -held nephrotoxins (lisinopril and spironolactone). Will resume at discharge.  7. Leukocytosis. Likely related to solumedrol yesterday in setting of acute illness. See #1 Procedures:    Consultations:    Discharge Exam: Vitals:   02/22/18 2300 02/23/18 0640  BP: (!) 147/63 (!) 141/70  Pulse: 61 62  Resp: 16 18  Temp: 97.8 F (36.6 C) 98.4 F (36.9 C)  SpO2: 98% 95%    General: ambulating in room with steady gait. No sob Cardiovascular: rrr no mgr no LE edema Respiratory: normal effort BS somewhat distant. Faint crackles bilateral bases  Discharge Instructions   Discharge Instructions    Call MD for:  difficulty breathing, headache or visual disturbances   Complete by:  As directed    Call MD for:  temperature >100.4   Complete by:  As directed    Diet - low sodium heart healthy   Complete by:  As directed    Discharge instructions   Complete by:  As directed    Follow up with PCP 1-2 weeks for evaluation of symptoms. Recommend BMET to evaluate kidney function. Consider repeat chest xray in 3-4 weeks as well   Increase activity slowly   Complete by:  As directed      Allergies as of 02/23/2018   No Known Allergies     Medication List    TAKE these medications   acetaminophen 325 MG tablet Commonly known as:  TYLENOL Take 2 tablets (650 mg total) by mouth every 6 (six) hours as needed for mild pain or fever (or temp > 37.5 C (99.5 F)).   ALPHAGAN P  0.1 % Soln Generic drug:  brimonidine Place 1 drop into the right eye 2 (two) times daily.   amLODipine 10 MG tablet Commonly known as:  NORVASC Take 10 mg by mouth daily.   amoxicillin-clavulanate 500-125 MG tablet Commonly known as:  AUGMENTIN Take 1 tablet (500 mg total) by mouth every 12 (twelve) hours for 5 days.   aspirin 81 MG tablet Take 81 mg by mouth daily.   atorvastatin 20 MG tablet Commonly known as:  LIPITOR take 1  tablet by mouth once daily (CHANGING FROM SIMVASTATIN) What changed:  See the new instructions.   carvedilol 12.5 MG tablet Commonly known as:  COREG Take 12.5 mg by mouth See admin instructions. Take one daily if she does not feel better she will take another at about 2pm   folic acid 1 MG tablet Commonly known as:  FOLVITE Take 1 tablet (1 mg total) by mouth daily.   gabapentin 100 MG capsule Commonly known as:  NEURONTIN Take 100 mg by mouth as needed (pain). What changed:  Another medication with the same name was removed. Continue taking this medication, and follow the directions you see here.   lisinopril 40 MG tablet Commonly known as:  PRINIVIL,ZESTRIL Take 40 mg by mouth daily.   loratadine 10 MG tablet Commonly known as:  CLARITIN Take 10 mg by mouth daily.   multivitamin with minerals Tabs tablet Take 1 tablet by mouth daily.   omeprazole 20 MG capsule Commonly known as:  PRILOSEC Take 1 capsule (20 mg total) by mouth daily.   oseltamivir 30 MG capsule Commonly known as:  TAMIFLU Take 1 capsule (30 mg total) by mouth daily.   spironolactone 25 MG tablet Commonly known as:  ALDACTONE Take 1 tablet (25 mg total) by mouth daily.   TRAVATAN Z 0.004 % Soln ophthalmic solution Generic drug:  Travoprost (BAK Free) Place 1 drop into the right eye at bedtime.      No Known Allergies Follow-up Information    [facility] Follow up.   Specialty:  Home Health Services Why:  Home Health Physical and Occupational Therapy Contact information: [address] [place] [address] [phone]            The results of significant diagnostics from this hospitalization (including imaging, microbiology, ancillary and laboratory) are listed below for reference.    Significant Diagnostic Studies: Dg Chest Port 1 View  Result Date: 02/21/2018 CLINICAL DATA:  Shortness of breath. EXAM: PORTABLE CHEST 1 VIEW COMPARISON:  06/02/2017 FINDINGS:  The cardiac silhouette, mediastinal and hilar contours are within normal limits and stable given the AP projection and portable technique. There is moderate tortuosity and calcification of the thoracic aorta. The lungs are clear of an acute process. No infiltrates, edema or effusions. The bony thorax is intact. IMPRESSION: No acute cardiopulmonary findings. Electronically Signed   By: Marijo Sanes M.D.   On: 02/21/2018 06:20    Microbiology: No results found for this or any previous visit (from the past 240 hour(s)).   Labs: Basic Metabolic Panel: Recent Labs  Lab 02/21/18 0610 02/21/18 0616 02/22/18 0354 02/23/18 0550  NA 138 139 133* 134*  K 4.9 4.7 4.7 4.9  CL 108  --  106 105  CO2 18*  --  18* 20*  GLUCOSE 171*  --  134* 78  BUN 22  --  32* 29*  CREATININE 1.55*  --  1.93* 1.48*  CALCIUM 8.9  --  8.7* 8.9  Liver Function Tests: Recent Labs  Lab 02/21/18 0610  AST 44*  ALT 26  ALKPHOS 73  BILITOT 0.6  PROT 7.8  ALBUMIN 3.2*   No results for input(s): LIPASE, AMYLASE in the last 168 hours. No results for input(s): AMMONIA in the last 168 hours. CBC: Recent Labs  Lab 02/21/18 0610 02/21/18 0616 02/22/18 0354  WBC 10.5  --  15.7*  NEUTROABS 8.0*  --   --   HGB 12.8 12.9 10.8*  HCT 41.6 38.0 33.8*  MCV 90.6  --  87.1  PLT 253  --  229   Cardiac Enzymes: Recent Labs  Lab 02/21/18 0610  TROPONINI <0.03   BNP: BNP (last 3 results) Recent Labs    02/21/18 0610  BNP 45.6    ProBNP (last 3 results) Recent Labs    01/27/18 1516  PROBNP 719    CBG: Recent Labs  Lab 02/21/18 0607 02/21/18 1211 02/21/18 1644 02/21/18 2111  GLUCAP 171* 229* 205* 166*       Signed:  Radene Gunning NP [facility] 02/23/2018, 10:56 AM

## 2018-02-24 NOTE — Telephone Encounter (Addendum)
Office note and telephone note (02/08/18) routed to Dr Mickie Hillier office via [place]. Patient cleared by Dr Tamala Julian 01/27/18.

## 2018-02-24 NOTE — Telephone Encounter (Signed)
This is a duplicate request. Patient was previous cleared by both Dr. Tamala Julian (in his last office note) and by Thompson Caul PA-C. Please forward both notes to requesting provider.

## 2018-02-25 ENCOUNTER — Ambulatory Visit: Payer: Medicare Other | Admitting: Interventional Cardiology

## 2018-02-25 NOTE — Progress Notes (Deleted)
Cardiology Office Note:    Date:  02/25/2018   ID:  [name], DOB 10-22-1932, MRN [id number]  PCP:  Dierdre Harness, FNP  Cardiologist:  Sinclair Grooms, MD   Referring MD: Dierdre Harness, FNP   No chief complaint on file.   History of Present Illness:    [name] is a 85 y.o. female with a hx of gum cancer s/p radiation in [place] (1990s) HTN, HLD, DMT2, CKD and chronic diastolic CHF who presents to clinic for follow-up.  Past Medical History:  Diagnosis Date  . Anemia 04/03/2011  . Anginal pain ([place])   . Anxiety   . Arthritis    "feet" (06/02/2017)  . Blind left eye 1999  . Depression   . Exertional dyspnea   . GERD (gastroesophageal reflux disease)   . High cholesterol   . Hip fracture ([place]) 07/17/11   fall from 1-2 feet; left  . Hypertension   . Ischemic stroke ([place]) 06/02/2017   "weak moving around" (06/02/2017)  . Prosthetic eye globe 1999   "from diabetic; left eye"  . Tongue cancer ([place]) 1998   S/P radiation  . Type II diabetes mellitus ([place])    not taking any medication (06/02/2017)    Past Surgical History:  Procedure Laterality Date  . ENUCLEATION Left   . FRACTURE SURGERY    . HIP ARTHROPLASTY  07/18/2011   Procedure: ARTHROPLASTY BIPOLAR HIP;  Surgeon: Nita Sells, MD;  Location: [facility];  Service: Orthopedics;  Laterality: Left;  . INTRAOCULAR PROSTHESES INSERTION  1999   left  . TONGUE BIOPSY  1998   "cancer"  . TUBAL LIGATION  1970's    Current Medications: No outpatient medications have been marked as taking for the 02/25/18 encounter (Appointment) with Belva Crome, MD.     Allergies:   Patient has no known allergies.   Social History   Socioeconomic History  . Marital status: Single    Spouse name: Not on file  . Number of children: Not on file  . Years of education: Not on file  . Highest education level: Not on file  Occupational History  . Not on file  Social Needs  . Financial resource strain: Not on  file  . Food insecurity:    Worry: Not on file    Inability: Not on file  . Transportation needs:    Medical: Not on file    Non-medical: Not on file  Tobacco Use  . Smoking status: Former Smoker    Packs/day: 1.50    Years: 47.00    Pack years: 70.50    Types: Cigarettes    Last attempt to quit: 09/13/1997    Years since quitting: 20.4  . Smokeless tobacco: Never Used  Substance and Sexual Activity  . Alcohol use: Yes    Alcohol/week: 17.0 standard drinks    Types: 17 Shots of liquor per week    Comment: 06/02/2017 "1/5 rum q weekend"  . Drug use: No    Types: Marijuana, Heroin    Comment: 06/02/2017  "last drug use was in the 1980s"  . Sexual activity: Not Currently  Lifestyle  . Physical activity:    Days per week: Not on file    Minutes per session: Not on file  . Stress: Not on file  Relationships  . Social connections:    Talks on phone: Not on file    Gets together: Not on file    Attends religious service: Not on  file    Active member of club or organization: Not on file    Attends meetings of clubs or organizations: Not on file    Relationship status: Not on file  Other Topics Concern  . Not on file  Social History Narrative  . Not on file     Family History: The patient's family history includes Diabetes type II in her father, mother, and sister. There is no history of Colon cancer.  ROS:   Please see the history of present illness.    *** All other systems reviewed and are negative.  EKGs/Labs/Other Studies Reviewed:    The following studies were reviewed today: ***  EKG:  EKG ***  Recent Labs: 06/04/2017: Magnesium 1.8 06/10/2017: TSH 2.40 01/27/2018: NT-Pro BNP 719 02/21/2018: ALT 26; B Natriuretic Peptide 45.6 02/22/2018: Hemoglobin 10.8; Platelets 229 02/23/2018: BUN 29; Creatinine, Ser 1.48; Potassium 4.9; Sodium 134  Recent Lipid Panel    Component Value Date/Time   CHOL 154 06/03/2017 0505   TRIG 111 06/03/2017 0505   HDL 56 06/03/2017 0505    CHOLHDL 2.8 06/03/2017 0505   VLDL 22 06/03/2017 0505   LDLCALC 76 06/03/2017 0505    Physical Exam:    VS:  There were no vitals taken for this visit.    Wt Readings from Last 3 Encounters:  02/23/18 144 lb 6.4 oz (65.5 kg)  01/27/18 146 lb (66.2 kg)  06/29/17 135 lb (61.2 kg)     GEN: ***. No acute distress HEENT: Normal NECK: No JVD. LYMPHATICS: No lymphadenopathy CARDIAC: ***RRR.  *** murmur, ***gallop, ***edema VASCULAR: *** Pulses, *** bruits RESPIRATORY:  Clear to auscultation without rales, wheezing or rhonchi  ABDOMEN: Soft, non-tender, non-distended, No pulsatile mass, MUSCULOSKELETAL: No deformity  SKIN: Warm and dry NEUROLOGIC:  Alert and oriented x 3 PSYCHIATRIC:  Normal affect   ASSESSMENT:    1. Chronic diastolic heart failure ([place])   2. Essential hypertension   3. Type 2 diabetes mellitus with hyperosmolarity without coma, with long-term current use of insulin (HCC)   4. Paroxysmal atrial fibrillation (HCC)   5. Stage 3 chronic kidney disease due to type 2 diabetes mellitus ([place])    PLAN:    In order of problems listed above:  1. ***   Medication Adjustments/Labs and Tests Ordered: Current medicines are reviewed at length with the patient today.  Concerns regarding medicines are outlined above.  No orders of the defined types were placed in this encounter.  No orders of the defined types were placed in this encounter.   There are no Patient Instructions on file for this visit.   Signed, Sinclair Grooms, MD  02/25/2018 7:13 AM    [place]

## 2018-02-26 ENCOUNTER — Encounter: Payer: Self-pay | Admitting: Interventional Cardiology

## 2018-03-30 NOTE — Progress Notes (Signed)
Patient with recent admission for Influenza A 02-21-18 thru 02-23-18. [name] still has dry non productive cough. Chart reviewed with Dr Ambrose Pancoast and he states pt needs to be symptom free and wait at least 4wks after admission. Pam at Dr [facility] office notified.

## 2018-04-06 ENCOUNTER — Encounter (HOSPITAL_BASED_OUTPATIENT_CLINIC_OR_DEPARTMENT_OTHER): Payer: Self-pay

## 2018-04-06 ENCOUNTER — Ambulatory Visit (HOSPITAL_BASED_OUTPATIENT_CLINIC_OR_DEPARTMENT_OTHER): Admit: 2018-04-06 | Payer: Medicare Other | Admitting: Otolaryngology

## 2018-04-06 SURGERY — MICROLARYNGOSCOPY
Anesthesia: General

## 2018-11-01 ENCOUNTER — Encounter (HOSPITAL_COMMUNITY): Payer: Self-pay

## 2018-11-01 ENCOUNTER — Other Ambulatory Visit: Payer: Self-pay

## 2018-11-01 ENCOUNTER — Emergency Department (HOSPITAL_COMMUNITY): Payer: Medicare Other

## 2018-11-01 ENCOUNTER — Inpatient Hospital Stay (HOSPITAL_COMMUNITY)
Admission: EM | Admit: 2018-11-01 | Discharge: 2018-11-03 | DRG: 189 | Disposition: A | Discharge status: Home / Self Care | Payer: Medicare Other | Attending: Family Medicine | Admitting: Family Medicine

## 2018-11-01 DIAGNOSIS — E1122 Type 2 diabetes mellitus with diabetic chronic kidney disease: Secondary | ICD-10-CM | POA: Diagnosis present

## 2018-11-01 DIAGNOSIS — E1159 Type 2 diabetes mellitus with other circulatory complications: Secondary | ICD-10-CM | POA: Diagnosis present

## 2018-11-01 DIAGNOSIS — Z23 Encounter for immunization: Secondary | ICD-10-CM | POA: Diagnosis not present

## 2018-11-01 DIAGNOSIS — Z20828 Contact with and (suspected) exposure to other viral communicable diseases: Secondary | ICD-10-CM | POA: Diagnosis present

## 2018-11-01 DIAGNOSIS — Z8673 Personal history of transient ischemic attack (TIA), and cerebral infarction without residual deficits: Secondary | ICD-10-CM

## 2018-11-01 DIAGNOSIS — F329 Major depressive disorder, single episode, unspecified: Secondary | ICD-10-CM | POA: Diagnosis present

## 2018-11-01 DIAGNOSIS — E785 Hyperlipidemia, unspecified: Secondary | ICD-10-CM | POA: Diagnosis present

## 2018-11-01 DIAGNOSIS — Z66 Do not resuscitate: Secondary | ICD-10-CM | POA: Diagnosis not present

## 2018-11-01 DIAGNOSIS — Z87891 Personal history of nicotine dependence: Secondary | ICD-10-CM | POA: Diagnosis not present

## 2018-11-01 DIAGNOSIS — I5032 Chronic diastolic (congestive) heart failure: Secondary | ICD-10-CM | POA: Diagnosis present

## 2018-11-01 DIAGNOSIS — I129 Hypertensive chronic kidney disease with stage 1 through stage 4 chronic kidney disease, or unspecified chronic kidney disease: Secondary | ICD-10-CM | POA: Diagnosis present

## 2018-11-01 DIAGNOSIS — H5462 Unqualified visual loss, left eye, normal vision right eye: Secondary | ICD-10-CM | POA: Diagnosis not present

## 2018-11-01 DIAGNOSIS — J9601 Acute respiratory failure with hypoxia: Secondary | ICD-10-CM | POA: Diagnosis not present

## 2018-11-01 DIAGNOSIS — E7800 Pure hypercholesterolemia, unspecified: Secondary | ICD-10-CM | POA: Diagnosis not present

## 2018-11-01 DIAGNOSIS — Z7982 Long term (current) use of aspirin: Secondary | ICD-10-CM | POA: Diagnosis not present

## 2018-11-01 DIAGNOSIS — Z833 Family history of diabetes mellitus: Secondary | ICD-10-CM

## 2018-11-01 DIAGNOSIS — I5033 Acute on chronic diastolic (congestive) heart failure: Secondary | ICD-10-CM | POA: Diagnosis present

## 2018-11-01 DIAGNOSIS — R079 Chest pain, unspecified: Secondary | ICD-10-CM | POA: Diagnosis not present

## 2018-11-01 DIAGNOSIS — I130 Hypertensive heart and chronic kidney disease with heart failure and stage 1 through stage 4 chronic kidney disease, or unspecified chronic kidney disease: Secondary | ICD-10-CM | POA: Diagnosis present

## 2018-11-01 DIAGNOSIS — Z79899 Other long term (current) drug therapy: Secondary | ICD-10-CM | POA: Diagnosis not present

## 2018-11-01 DIAGNOSIS — R0789 Other chest pain: Secondary | ICD-10-CM | POA: Diagnosis present

## 2018-11-01 DIAGNOSIS — N183 Chronic kidney disease, stage 3 unspecified: Secondary | ICD-10-CM | POA: Diagnosis present

## 2018-11-01 DIAGNOSIS — F419 Anxiety disorder, unspecified: Secondary | ICD-10-CM | POA: Diagnosis present

## 2018-11-01 DIAGNOSIS — K219 Gastro-esophageal reflux disease without esophagitis: Secondary | ICD-10-CM | POA: Diagnosis not present

## 2018-11-01 DIAGNOSIS — Z923 Personal history of irradiation: Secondary | ICD-10-CM | POA: Diagnosis not present

## 2018-11-01 DIAGNOSIS — N179 Acute kidney failure, unspecified: Secondary | ICD-10-CM | POA: Diagnosis present

## 2018-11-01 DIAGNOSIS — E1169 Type 2 diabetes mellitus with other specified complication: Secondary | ICD-10-CM | POA: Diagnosis present

## 2018-11-01 DIAGNOSIS — R0602 Shortness of breath: Secondary | ICD-10-CM | POA: Diagnosis present

## 2018-11-01 DIAGNOSIS — Z85810 Personal history of malignant neoplasm of tongue: Secondary | ICD-10-CM

## 2018-11-01 LAB — BASIC METABOLIC PANEL
Anion gap: 13 (ref 5–15)
BUN: 23 mg/dL (ref 8–23)
CO2: 19 mmol/L — ABNORMAL LOW (ref 22–32)
Calcium: 8.7 mg/dL — ABNORMAL LOW (ref 8.9–10.3)
Chloride: 101 mmol/L (ref 98–111)
Creatinine, Ser: 2.12 mg/dL — ABNORMAL HIGH (ref 0.44–1.00)
GFR calc Af Amer: 24 mL/min — ABNORMAL LOW (ref >60)
GFR calc non Af Amer: 21 mL/min — ABNORMAL LOW (ref >60)
Glucose, Bld: 113 mg/dL — ABNORMAL HIGH (ref 70–99)
Potassium: 3.8 mmol/L (ref 3.5–5.1)
Sodium: 133 mmol/L — ABNORMAL LOW (ref 135–145)

## 2018-11-01 LAB — CBC
HCT: 37.7 % (ref 36.0–46.0)
Hemoglobin: 12.3 g/dL (ref 12.0–15.0)
MCH: 30.4 pg (ref 26.0–34.0)
MCHC: 32.6 g/dL (ref 30.0–36.0)
MCV: 93.1 fL (ref 80.0–100.0)
Platelets: 201 10*3/uL (ref 150–400)
RBC: 4.05 MIL/uL (ref 3.87–5.11)
RDW: 13.8 % (ref 11.5–15.5)
WBC: 8.7 10*3/uL (ref 4.0–10.5)
nRBC: 0 % (ref 0.0–0.2)

## 2018-11-01 LAB — TROPONIN I (HIGH SENSITIVITY): Troponin I (High Sensitivity): 22 ng/L — ABNORMAL HIGH (ref <18)

## 2018-11-01 MED ORDER — ASPIRIN 81 MG PO CHEW
324.0000 mg | CHEWABLE_TABLET | Freq: Once | ORAL | Status: AC
  Administered 2018-11-01: 324 mg via ORAL
  Filled 2018-11-01: qty 4

## 2018-11-01 MED ORDER — SODIUM CHLORIDE 0.9% FLUSH: 3.0000 mL | Freq: Once | INTRAVENOUS | Status: DC

## 2018-11-01 NOTE — ED Provider Notes (Signed)
[name] EMERGENCY DEPARTMENT Provider Note   CSN: [id number] Arrival date & time: 11/01/18  [place]     History   Chief Complaint Chief Complaint  Patient presents with  . Shortness of Breath  . Chest Pain    HPI [name] is a 85 y.o. female.     HPI   85 year old female with chest pain and dyspnea.  Onset this afternoon.  She is walking back to the bus having a mail order.  She began feeling short of breath and had some tightness in her chest.  She also states that "I felt in my gums but I do not have any teeth."  On further questioning she reports similar symptoms although not as severe intermittently over the past several weeks.  Consistently brought on by exertion and consistently improved with rest.  Currently very mild chest discomfort but much improved from earlier.  Past Medical History:  Diagnosis Date  . Anemia 04/03/2011  . Anginal pain ([place])   . Anxiety   . Arthritis    "feet" (06/02/2017)  . Blind left eye 1999  . Depression   . Exertional dyspnea   . GERD (gastroesophageal reflux disease)   . High cholesterol   . Hip fracture ([place]) 07/17/11   fall from 1-2 feet; left  . Hypertension   . Ischemic stroke ([place]) 06/02/2017   "weak moving around" (06/02/2017)  . Prosthetic eye globe 1999   "from diabetic; left eye"  . Tongue cancer ([place]) 1998   S/P radiation  . Type II diabetes mellitus ([place])    not taking any medication (06/02/2017)    Patient Active Problem List   Diagnosis Date Noted  . Leukocytosis 02/22/2018  . Influenza A 02/21/2018  . Wheezy bronchitis 02/21/2018  . Acute wheezy bronchitis 02/21/2018  . Hypertension associated with stage 3 chronic kidney disease due to type 2 diabetes mellitus ([place]) 06/09/2017  . Dyslipidemia associated with type 2 diabetes mellitus ([place]) 06/09/2017  . Stage 3 chronic kidney disease due to type 2 diabetes mellitus ([place]) 06/09/2017  . Peripheral sensory neuropathy due to type 2 diabetes  mellitus ([place]) 06/09/2017  . GERD without esophagitis 06/09/2017  . Glaucoma due to type 2 diabetes mellitus ([place]) 06/09/2017  . Alcohol abuse 06/04/2017  . Ischemic stroke ([place]) 06/02/2017  . Type 2 diabetes mellitus with vascular disease ([place]) 06/02/2017  . Chest pain 06/02/2017  . Closed non-physeal fracture of metatarsal bone of right foot 07/31/2016  . Hypertensive urgency 02/17/2014  . Chronic diastolic heart failure ([place]) 03/15/2013  . Closed left hip fracture ([place]) 07/17/2011    Past Surgical History:  Procedure Laterality Date  . ENUCLEATION Left   . FRACTURE SURGERY    . HIP ARTHROPLASTY  07/18/2011   Procedure: ARTHROPLASTY BIPOLAR HIP;  Surgeon: Nita Sells, MD;  Location: [facility];  Service: Orthopedics;  Laterality: Left;  . INTRAOCULAR PROSTHESES INSERTION  1999   left  . TONGUE BIOPSY  1998   "cancer"  . TUBAL LIGATION  1970's     OB History   No obstetric history on file.      Home Medications    Prior to Admission medications   Medication Sig Start Date End Date Taking? Authorizing Provider  acetaminophen (TYLENOL) 325 MG tablet Take 2 tablets (650 mg total) by mouth every 6 (six) hours as needed for mild pain or fever (or temp > 37.5 C (99.5 F)). 06/05/17  Yes Elgergawy, Silver Huguenin, MD  ALPHAGAN P 0.1 %  SOLN Place 1 drop into the right eye 2 (two) times daily. 10/10/15  Yes [provider]  amLODipine (NORVASC) 10 MG tablet Take 10 mg by mouth daily.   Yes [provider]  aspirin 81 MG tablet Take 81 mg by mouth daily.   Yes [provider]  atorvastatin (LIPITOR) 20 MG tablet take 1 tablet by mouth once daily (CHANGING FROM SIMVASTATIN) Patient taking differently: Take 20 mg by mouth daily at 6 PM.  02/12/17  Yes Eileen [place], PA-C  carvedilol (COREG) 12.5 MG tablet Take 12.5 mg by mouth See admin instructions. Take one daily if she does not feel better she will take another at about 2pm   Yes [provider]   folic acid (FOLVITE) 1 MG tablet Take 1 tablet (1 mg total) by mouth daily. 06/06/17  Yes Elgergawy, Silver Huguenin, MD  gabapentin (NEURONTIN) 100 MG capsule Take 100 mg by mouth as needed (pain).   Yes [provider]  lisinopril (PRINIVIL,ZESTRIL) 40 MG tablet Take 40 mg by mouth daily. 11/20/17  Yes [provider]  loratadine (CLARITIN) 10 MG tablet Take 10 mg by mouth daily. 01/20/18  Yes [provider]  Multiple Vitamin (MULTIVITAMIN WITH MINERALS) TABS tablet Take 1 tablet by mouth daily. 06/06/17  Yes Elgergawy, Silver Huguenin, MD  omeprazole (PRILOSEC) 20 MG capsule Take 1 capsule (20 mg total) by mouth daily. 08/21/17  Yes Belva Crome, MD  TRAVATAN Z 0.004 % SOLN ophthalmic solution Place 1 drop into the right eye at bedtime.  11/24/12  Yes [provider]    Family History Family History  Problem Relation Age of Onset  . Diabetes type II Mother   . Diabetes type II Father   . Diabetes type II Sister   . Colon cancer Neg Hx     Social History Social History   Tobacco Use  . Smoking status: Former Smoker    Packs/day: 1.50    Years: 47.00    Pack years: 70.50    Types: Cigarettes    Quit date: 09/13/1997    Years since quitting: 21.1  . Smokeless tobacco: Never Used  Substance Use Topics  . Alcohol use: Yes    Alcohol/week: 17.0 standard drinks    Types: 17 Shots of liquor per week    Comment: 06/02/2017 "1/5 rum q weekend"  . Drug use: No    Types: Marijuana, Heroin    Comment: 06/02/2017  "last drug use was in the 1980s"     Allergies   Patient has no known allergies.   Review of Systems Review of Systems All systems reviewed and negative, other than as noted in HPI.   Physical Exam Updated Vital Signs BP (!) 164/92 (BP Location: Right Arm)   Pulse 98   Temp 99.1 F (37.3 C) (Oral)   Resp 18   Ht 5\' 2"  (1.575 m)   SpO2 94%   BMI 26.41 kg/m   Physical Exam Vitals signs and nursing note reviewed.  Constitutional:       General: She is not in acute distress.    Appearance: She is well-developed.  HENT:     Head: Normocephalic and atraumatic.  Eyes:     General:        Right eye: No discharge.        Left eye: No discharge.     Conjunctiva/sclera: Conjunctivae normal.  Neck:     Musculoskeletal: Neck supple.  Cardiovascular:     Rate  and Rhythm: Normal rate and regular rhythm.     Heart sounds: Normal heart sounds. No murmur. No friction rub. No gallop.   Pulmonary:     Effort: Pulmonary effort is normal. No respiratory distress.     Breath sounds: Normal breath sounds.  Abdominal:     General: There is no distension.     Palpations: Abdomen is soft.     Tenderness: There is no abdominal tenderness.  Musculoskeletal:        General: No tenderness.  Skin:    General: Skin is warm and dry.  Neurological:     Mental Status: She is alert.  Psychiatric:        Behavior: Behavior normal.        Thought Content: Thought content normal.    ED Treatments / Results  Labs (all labs ordered are listed, but only abnormal results are displayed) Labs Reviewed  BASIC METABOLIC PANEL - Abnormal; Notable for the following components:      Result Value   Sodium 133 (*)    CO2 19 (*)    Glucose, Bld 113 (*)    Creatinine, Ser 2.12 (*)    Calcium 8.7 (*)    GFR calc non Af Amer 21 (*)    GFR calc Af Amer 24 (*)    All other components within normal limits  BRAIN NATRIURETIC PEPTIDE - Abnormal; Notable for the following components:   B Natriuretic Peptide 244.8 (*)    All other components within normal limits  D-DIMER, QUANTITATIVE (NOT AT [facility]) - Abnormal; Notable for the following components:   D-Dimer, Quant 2.32 (*)    All other components within normal limits  CBC - Abnormal; Notable for the following components:   Hemoglobin 11.9 (*)    HCT 35.6 (*)    All other components within normal limits  BASIC METABOLIC PANEL - Abnormal; Notable for the following components:   Glucose, Bld 107 (*)     Creatinine, Ser 1.77 (*)    Calcium 8.6 (*)    GFR calc non Af Amer 26 (*)    GFR calc Af Amer 30 (*)    All other components within normal limits  LIPID PANEL - Abnormal; Notable for the following components:   HDL 40 (*)    All other components within normal limits  TROPONIN I (HIGH SENSITIVITY) - Abnormal; Notable for the following components:   Troponin I (High Sensitivity) 22 (*)    All other components within normal limits  TROPONIN I (HIGH SENSITIVITY) - Abnormal; Notable for the following components:   Troponin I (High Sensitivity) 29 (*)    All other components within normal limits  TROPONIN I (HIGH SENSITIVITY) - Abnormal; Notable for the following components:   Troponin I (High Sensitivity) 27 (*)    All other components within normal limits  SARS CORONAVIRUS 2 (TAT 6-24 HRS)  CBC  GLUCOSE, CAPILLARY  GLUCOSE, CAPILLARY   EKG EKG Interpretation  Date/Time:  Monday November 01 2018 19:02:36 EDT Ventricular Rate:  97 PR Interval:  160 QRS Duration: 82 QT Interval:  334 QTC Calculation: 424 R Axis:   -13 Text Interpretation:  Normal sinus rhythm with sinus arrhythmia Nonspecific T wave abnormality Abnormal ECG Confirmed by Virgel Manifold [phone]) on 11/01/2018 7:59:18 PM  Radiology Dg Chest 2 View  Result Date: 11/01/2018 CLINICAL DATA:  Chest pain EXAM: CHEST - 2 VIEW COMPARISON:  02/21/2018 FINDINGS: Mildly low lung volumes. No acute consolidation or pleural effusion mild cardiomegaly with  aortic atherosclerosis. No pneumothorax. Tortuous aorta. Chronic mild wedging deformities of midthoracic vertebra and at the thoracolumbar junction. IMPRESSION: No active cardiopulmonary disease. Low lung volumes with mild cardiomegaly. Electronically Signed   By: Donavan Foil M.D.   On: 11/01/2018 20:27   Procedures Procedures (including critical care time)  Medications Ordered in ED Medications  sodium chloride flush (NS) 0.9 % injection 3 mL (3 mLs Intravenous Not Given  11/01/18 1946)  aspirin chewable tablet 324 mg (324 mg Oral Given 11/01/18 2042)   Initial Impression / Assessment and Plan / ED Course  I have reviewed the triage vital signs and the nursing notes.  Pertinent labs & imaging results that were available during my care of the patient were reviewed by me and considered in my medical decision making (see chart for details).  85 year old female with chest pain and dyspnea.  Some features concerning for possible ACS.  Initial troponin is just minimally elevated with symptoms started shortly before arrival.  Currently pain-free.  Aspirin given.  Heparin deferred.  Will admit for chest pain rule out.  Final Clinical Impressions(s) / ED Diagnoses   Final diagnoses:  Chest pain, unspecified type    ED Discharge Orders    None       Virgel Manifold, MD 11/02/18 1649

## 2018-11-01 NOTE — H&P (Signed)
History and Physical    [name] UUE:[id number] DOB: Jul 28, 1932 DOA: 11/01/2018  PCP: Dierdre Harness, FNP  Patient coming from: Home  I have personally briefly reviewed patient's old medical records in [place]  Chief Complaint: Chest pain  HPI: [name] is a 85 y.o. female with medical history significant for history of CVA, chronic diastolic CHF (EF 91-50%, V6PV by TTE 06/03/2017), CKD stage III, hypertension, hyperlipidemia, history of tongue cancer s/p radiation therapy who presents to the ED for evaluation of chest pain.  Patient states she has been having episodes of exertional squeezing type central chest pain with radiation to her jaw ongoing over the last month.  Episodes are associated with shortness of breath and unrelieved after approximately 30 minutes of rest.  She denies any associated palpitations, nausea, vomiting, diaphoresis, lightheadedness, dizziness, fevers, chills or new cough.  She reports that chronic history of heartburn/reflux which feels different than the symptoms.  She denies any similar symptoms in the past.  She also reports decreased oral intake recently and is currently feeling thirsty and dehydrated.  She feels that her urine output has decreased recently.  She denies seeing any swelling in her lower extremities.   She underwent stress testing 06/05/2017 which showed a small area of inferior basal ischemia but was otherwise considered a low risk study.  ED Course:  Initial vitals show BP 180/99, pulse 105, RR 20, temp 99.1 Fahrenheit, SPO2 83% on room air.  Patient was placed on 4 L supplemental O2 via [place] with improvement in O2 saturations to 94%.  Labs notable for sodium 133, potassium 3.8, bicarb 19, BUN 23, creatinine 2.12, EGFR 24, WBC 8.7, hemoglobin 12.3, platelets 201,000, high-sensitivity troponin 22.  D-dimer, BNP, and repeat troponin were collected and pending.  SARS-CoV-2 test was obtained and pending.  EKG showed  sinus rhythm with nonspecific T wave changes in the inferolateral leads.  2 view chest x-ray showed slightly low lung volumes without focal consolidation, effusion, or pulmonary edema.  Patient was given aspirin 324 mg and the hospitalist service was consulted to admit for further evaluation and management.  Review of Systems: All systems reviewed and are negative except as documented in history of present illness above.   Past Medical History:  Diagnosis Date  . Anemia 04/03/2011  . Anginal pain ([place])   . Anxiety   . Arthritis    "feet" (06/02/2017)  . Blind left eye 1999  . Depression   . Exertional dyspnea   . GERD (gastroesophageal reflux disease)   . High cholesterol   . Hip fracture ([place]) 07/17/11   fall from 1-2 feet; left  . Hypertension   . Ischemic stroke ([place]) 06/02/2017   "weak moving around" (06/02/2017)  . Prosthetic eye globe 1999   "from diabetic; left eye"  . Tongue cancer ([place]) 1998   S/P radiation  . Type II diabetes mellitus ([place])    not taking any medication (06/02/2017)    Past Surgical History:  Procedure Laterality Date  . ENUCLEATION Left   . FRACTURE SURGERY    . HIP ARTHROPLASTY  07/18/2011   Procedure: ARTHROPLASTY BIPOLAR HIP;  Surgeon: Nita Sells, MD;  Location: [facility];  Service: Orthopedics;  Laterality: Left;  . INTRAOCULAR PROSTHESES INSERTION  1999   left  . TONGUE BIOPSY  1998   "cancer"  . TUBAL LIGATION  1970's    Social History:  reports that she quit smoking about 21 years ago. Her smoking use included cigarettes. She  has a 70.50 pack-year smoking history. She has never used smokeless tobacco. She reports current alcohol use of about 17.0 standard drinks of alcohol per week. She reports that she does not use drugs.  No Known Allergies  Family History  Problem Relation Age of Onset  . Diabetes type II Mother   . Diabetes type II Father   . Diabetes type II Sister   . Colon cancer Neg Hx      Prior to Admission  medications   Medication Sig Start Date End Date Taking? Authorizing Provider  acetaminophen (TYLENOL) 325 MG tablet Take 2 tablets (650 mg total) by mouth every 6 (six) hours as needed for mild pain or fever (or temp > 37.5 C (99.5 F)). 06/05/17  Yes Elgergawy, Silver Huguenin, MD  ALPHAGAN P 0.1 % SOLN Place 1 drop into the right eye 2 (two) times daily. 10/10/15  Yes [provider]  amLODipine (NORVASC) 10 MG tablet Take 10 mg by mouth daily.   Yes [provider]  aspirin 81 MG tablet Take 81 mg by mouth daily.   Yes [provider]  atorvastatin (LIPITOR) 20 MG tablet take 1 tablet by mouth once daily (CHANGING FROM SIMVASTATIN) Patient taking differently: Take 20 mg by mouth daily at 6 PM.  02/12/17  Yes Eileen Stanford, PA-C  carvedilol (COREG) 12.5 MG tablet Take 12.5 mg by mouth See admin instructions. Take one daily if she does not feel better she will take another at about 2pm   Yes [provider]  folic acid (FOLVITE) 1 MG tablet Take 1 tablet (1 mg total) by mouth daily. 06/06/17  Yes Elgergawy, Silver Huguenin, MD  gabapentin (NEURONTIN) 100 MG capsule Take 100 mg by mouth as needed (pain).   Yes [provider]  lisinopril (PRINIVIL,ZESTRIL) 40 MG tablet Take 40 mg by mouth daily. 11/20/17  Yes [provider]  loratadine (CLARITIN) 10 MG tablet Take 10 mg by mouth daily. 01/20/18  Yes [provider]  Multiple Vitamin (MULTIVITAMIN WITH MINERALS) TABS tablet Take 1 tablet by mouth daily. 06/06/17  Yes Elgergawy, Silver Huguenin, MD  omeprazole (PRILOSEC) 20 MG capsule Take 1 capsule (20 mg total) by mouth daily. 08/21/17  Yes Belva Crome, MD  TRAVATAN Z 0.004 % SOLN ophthalmic solution Place 1 drop into the right eye at bedtime.  11/24/12  Yes [provider]    Physical Exam: Vitals:   11/01/18 2315 11/01/18 2315 11/01/18 2349 11/02/18 0000  BP: (!) 156/74 (!) 156/74 (!) 141/54 (!) 145/93  Pulse: 79 82 72 78  Resp: 19 18 16  (!)  22  Temp:      TempSrc:      SpO2: 94% 96% 98% 94%  Height:        Constitutional: Elderly woman resting supine in bed, NAD, calm, comfortable Eyes: Left eye prosthesis in place, lids and conjunctivae normal ENMT: Mucous membranes are dry. Posterior pharynx clear of any exudate or lesions. Neck: normal, supple, no masses. Respiratory: Faint inspiratory crackles bilaterally. Normal respiratory effort. No accessory muscle use.  Cardiovascular: Irregularly irregular with 2/6 systolic murmur. No extremity edema. 2+ pedal pulses. Abdomen: no tenderness, no masses palpated. No hepatosplenomegaly. Bowel sounds positive.  Musculoskeletal: no clubbing / cyanosis. No joint deformity upper and lower extremities. Good ROM, no contractures. Normal muscle tone.  Skin: Skin is dry, no rashes, lesions, ulcers. No induration Neurologic: CN 2-12 grossly intact. Sensation intact, Strength 5/5 in all 4.  Psychiatric: Normal judgment and  insight. Alert and oriented x 3. Normal mood.    Labs on Admission: I have personally reviewed following labs and imaging studies  CBC: Recent Labs  Lab 11/01/18 1940  WBC 8.7  HGB 12.3  HCT 37.7  MCV 93.1  PLT 017   Basic Metabolic Panel: Recent Labs  Lab 11/01/18 1940  NA 133*  K 3.8  CL 101  CO2 19*  GLUCOSE 113*  BUN 23  CREATININE 2.12*  CALCIUM 8.7*   GFR: CrCl cannot be calculated (Unknown ideal weight.). Liver Function Tests: No results for input(s): AST, ALT, ALKPHOS, BILITOT, PROT, ALBUMIN in the last 168 hours. No results for input(s): LIPASE, AMYLASE in the last 168 hours. No results for input(s): AMMONIA in the last 168 hours. Coagulation Profile: No results for input(s): INR, PROTIME in the last 168 hours. Cardiac Enzymes: No results for input(s): CKTOTAL, CKMB, CKMBINDEX, TROPONINI in the last 168 hours. BNP (last 3 results) Recent Labs    01/27/18 1516  PROBNP 719   HbA1C: No results for input(s): HGBA1C in the last 72  hours. CBG: No results for input(s): GLUCAP in the last 168 hours. Lipid Profile: No results for input(s): CHOL, HDL, LDLCALC, TRIG, CHOLHDL, LDLDIRECT in the last 72 hours. Thyroid Function Tests: No results for input(s): TSH, T4TOTAL, FREET4, T3FREE, THYROIDAB in the last 72 hours. Anemia Panel: No results for input(s): VITAMINB12, FOLATE, FERRITIN, TIBC, IRON, RETICCTPCT in the last 72 hours. Urine analysis:    Component Value Date/Time   COLORURINE YELLOW 06/02/2017 2110   APPEARANCEUR HAZY (A) 06/02/2017 2110   LABSPEC 1.005 06/02/2017 2110   PHURINE 7.0 06/02/2017 2110   GLUCOSEU NEGATIVE 06/02/2017 2110   HGBUR SMALL (A) 06/02/2017 2110   BILIRUBINUR NEGATIVE 06/02/2017 2110   KETONESUR NEGATIVE 06/02/2017 2110   PROTEINUR 100 (A) 06/02/2017 2110   UROBILINOGEN 0.2 02/07/2014 2241   NITRITE POSITIVE (A) 06/02/2017 2110   LEUKOCYTESUR MODERATE (A) 06/02/2017 2110    Radiological Exams on Admission: Dg Chest 2 View  Result Date: 11/01/2018 CLINICAL DATA:  Chest pain EXAM: CHEST - 2 VIEW COMPARISON:  02/21/2018 FINDINGS: Mildly low lung volumes. No acute consolidation or pleural effusion mild cardiomegaly with aortic atherosclerosis. No pneumothorax. Tortuous aorta. Chronic mild wedging deformities of midthoracic vertebra and at the thoracolumbar junction. IMPRESSION: No active cardiopulmonary disease. Low lung volumes with mild cardiomegaly. Electronically Signed   By: Donavan Foil M.D.   On: 11/01/2018 20:27    EKG: Independently reviewed.  Sinus rhythm with nonspecific T wave changes in inferolateral leads, not significantly changed from prior.  Assessment/Plan Principal Problem:   Chest pain Active Problems:   Chronic diastolic heart failure (HCC)   Type 2 diabetes mellitus with vascular disease ([place])   Hypertension associated with stage 3 chronic kidney disease due to type 2 diabetes mellitus ([place])   Dyslipidemia associated with type 2 diabetes mellitus ([place])    Acute kidney injury superimposed on chronic kidney disease (HCC)  [name] is a 85 y.o. female with medical history significant for history of CVA, chronic diastolic CHF (EF 51-02%, H8NI by TTE 06/03/2017), CKD stage III, hypertension, hyperlipidemia, history of tongue cancer s/p radiation therapy who is admitted for chest pain.   Chest pain: Patient with exertional chest pain and shortness of breath, possibly exertional angina.  It appears she did have a low risk stress test 06/05/2017 that did show findings suggestive of a small area of inferior basal ischemia. -Monitor on telemetry -Continue aspirin 81 mg daily -Check echocardiogram -  Repeat troponin pending, if rising will continue to trend -Continue atorvastatin, check lipid panel -Continue Coreg -Nitroglycerin as needed  Hypoxia: Reportedly hypoxic to 83% on room air on arrival to ED improved with supplemental O2.  At time of admission, O2 saturations >94% on room air when oxygen is turned down.  Chest x-ray without any acute cardiopulmonary process.  Does not appear volume overloaded, actually looks volume depleted.  D-dimer is elevated. -Check VQ scan given renal function to assess for PE  Acute on chronic stage III kidney injury: Suspect prerenal as she appears volume depleted.  Hold home lisinopril and continue IV fluid resuscitation overnight.  Chronic diastolic CHF: EF 03-70%, D6KR by TTE 06/03/2017.  Appears volume depleted on admission.  Hydrating with IV fluids overnight as above.  Monitor strict I/O's and daily weights.  She has not required diuretics as an outpatient.  Type 2 diabetes: Diet controlled, A1c 6.1 or below last 10 years.  Hypertension: Resume home amlodipine, carvedilol.  Holding lisinopril as above.  Hyperlipidemia: Continue atorvastatin.   DVT prophylaxis: Subcutaneous heparin Code Status: DNR, confirmed with patient Family Communication: Discussed with granddaughter Salvatore Decent by phone  [phone] Disposition Plan: Pending clinical progress Consults called: None Admission status: Observation   Zada Finders MD [facility]  If 7PM-7AM, please contact night-coverage [email or web address]  11/02/2018, 12:22 AM

## 2018-11-01 NOTE — ED Notes (Signed)
[phone] Mcdonald pts grandaughter wants an update on pt, she brought her in

## 2018-11-01 NOTE — ED Notes (Signed)
Unable to access peripheral IV despite several attempts , IV team consult ordered.

## 2018-11-01 NOTE — ED Triage Notes (Signed)
Pt reports intermittent chest tightness and shortness of breath. Pt does not wear oxygen at home. Pt 83% on room air in triage. 4L [place] applied and pt at 91%. Pt a.o, resp e/u.

## 2018-11-01 NOTE — ED Notes (Signed)
ED TO INPATIENT HANDOFF REPORT  ED Nurse Name and Phone #:  Clydene Laming [phone]  S Name/Age/Gender [name] 85 y.o. female Room/Bed: 033C/033C  Code Status   Code Status: Prior  Home/SNF/Other Home {Patient oriented x4 Is this baseline? Yes  Triage Complete: Triage complete  Chief Complaint Chest Tightness; SOB  Triage Note Pt reports intermittent chest tightness and shortness of breath. Pt does not wear oxygen at home. Pt 83% on room air in triage. 4L [place] applied and pt at 91%. Pt a.o, resp e/u.   Allergies No Known Allergies  Level of Care/Admitting Diagnosis ED Disposition    ED Disposition Condition [facility] Area: [place] [100100]  Level of Care: Telemetry Cardiac [103]  I expect the patient will be discharged within 24 hours: Yes  LOW acuity---Tx typically complete <24 hrs---ACUTE conditions typically can be evaluated <24 hours---LABS likely to return to acceptable levels <24 hours---IS near functional baseline---EXPECTED to return to current living arrangement---NOT newly hypoxic: Meets criteria for 5C-Observation unit  Covid Evaluation: Asymptomatic Screening Protocol (No Symptoms)  Diagnosis: Chest pain [017510]  Admitting Physician: Lenore Cordia [[id number]]  Attending Physician: Lenore Cordia [[id number]]  PT Class (Do Not Modify): Observation [104]  PT Acc Code (Do Not Modify): Observation [10022]       B Medical/Surgery History Past Medical History:  Diagnosis Date  . Anemia 04/03/2011  . Anginal pain ([place])   . Anxiety   . Arthritis    "feet" (06/02/2017)  . Blind left eye 1999  . Depression   . Exertional dyspnea   . GERD (gastroesophageal reflux disease)   . High cholesterol   . Hip fracture ([place]) 07/17/11   fall from 1-2 feet; left  . Hypertension   . Ischemic stroke ([place]) 06/02/2017   "weak moving around" (06/02/2017)  . Prosthetic eye globe 1999   "from diabetic; left eye"  . Tongue cancer ([place])  1998   S/P radiation  . Type II diabetes mellitus ([place])    not taking any medication (06/02/2017)   Past Surgical History:  Procedure Laterality Date  . ENUCLEATION Left   . FRACTURE SURGERY    . HIP ARTHROPLASTY  07/18/2011   Procedure: ARTHROPLASTY BIPOLAR HIP;  Surgeon: Nita Sells, MD;  Location: [facility];  Service: Orthopedics;  Laterality: Left;  . INTRAOCULAR PROSTHESES INSERTION  1999   left  . TONGUE BIOPSY  1998   "cancer"  . TUBAL LIGATION  1970's     A IV Location/Drains/Wounds Patient Lines/Drains/Airways Status   Active Line/Drains/Airways    Name:   Placement date:   Placement time:   Site:   Days:   Peripheral IV 11/01/18 Right;Upper;Medial Arm   11/01/18    2131    Arm   less than 1          Intake/Output Last 24 hours No intake or output data in the 24 hours ending 11/01/18 2358  Labs/Imaging Results for orders placed or performed during the hospital encounter of 11/01/18 (from the past 48 hour(s))  Basic metabolic panel     Status: Abnormal   Collection Time: 11/01/18  7:40 PM  Result Value Ref Range   Sodium 133 (L) 135 - 145 mmol/L   Potassium 3.8 3.5 - 5.1 mmol/L   Chloride 101 98 - 111 mmol/L   CO2 19 (L) 22 - 32 mmol/L   Glucose, Bld 113 (H) 70 - 99 mg/dL   BUN 23 8 -  23 mg/dL   Creatinine, Ser 2.12 (H) 0.44 - 1.00 mg/dL   Calcium 8.7 (L) 8.9 - 10.3 mg/dL   GFR calc non Af Amer 21 (L) >60 mL/min   GFR calc Af Amer 24 (L) >60 mL/min   Anion gap 13 5 - 15    Comment: Performed at [facility] [address] [place], [place] [address]  CBC     Status: None   Collection Time: 11/01/18  7:40 PM  Result Value Ref Range   WBC 8.7 4.0 - 10.5 K/uL   RBC 4.05 3.87 - 5.11 MIL/uL   Hemoglobin 12.3 12.0 - 15.0 g/dL   HCT 37.7 36.0 - 46.0 %   MCV 93.1 80.0 - 100.0 fL   MCH 30.4 26.0 - 34.0 pg   MCHC 32.6 30.0 - 36.0 g/dL   RDW 13.8 11.5 - 15.5 %   Platelets 201 150 - 400 K/uL   nRBC 0.0 0.0 - 0.2 %    Comment: Performed at [facility], [place] [address] [place], [place] [address]  Troponin I (High Sensitivity)     Status: Abnormal   Collection Time: 11/01/18  7:40 PM  Result Value Ref Range   Troponin I (High Sensitivity) 22 (H) <18 ng/L    Comment: (NOTE) Elevated high sensitivity troponin I (hsTnI) values and significant  changes across serial measurements may suggest ACS but many other  chronic and acute conditions are known to elevate hsTnI results.  Refer to the Links section for chest pain algorithms and additional  guidance. Performed at [facility], [place] [address], [place], [place] [address]    Dg Chest 2 View  Result Date: 11/01/2018 CLINICAL DATA:  Chest pain EXAM: CHEST - 2 VIEW COMPARISON:  02/21/2018 FINDINGS: Mildly low lung volumes. No acute consolidation or pleural effusion mild cardiomegaly with aortic atherosclerosis. No pneumothorax. Tortuous aorta. Chronic mild wedging deformities of midthoracic vertebra and at the thoracolumbar junction. IMPRESSION: No active cardiopulmonary disease. Low lung volumes with mild cardiomegaly. Electronically Signed   By: Donavan Foil M.D.   On: 11/01/2018 20:27    Pending Labs Unresulted Labs (From admission, onward)    Start     Ordered   11/01/18 2156  SARS CORONAVIRUS 2 (TAT 6-24 HRS) Nasopharyngeal Nasopharyngeal Swab  (Asymptomatic/Tier 2 Patients Labs)  Once,   STAT    Question Answer Comment  Is this test for diagnosis or screening Screening   Symptomatic for COVID-19 as defined by CDC No   Hospitalized for COVID-19 No   Admitted to ICU for COVID-19 No   Previously tested for COVID-19 No   Resident in a congregate (group) care setting No   Employed in healthcare setting No   Pregnant No      11/01/18 2155   11/01/18 2155  Brain natriuretic peptide  Once,   STAT     11/01/18 2154   11/01/18 2155  D-dimer, quantitative (not at [facility])  Once,   STAT     11/01/18 2154          Vitals/Pain Today's Vitals   11/01/18 2137 11/01/18  2315 11/01/18 2315 11/01/18 2349  BP:  (!) 156/74 (!) 156/74 (!) 141/54  Pulse:  79 82 72  Resp:  19 18 16   Temp:      TempSrc:      SpO2:  94% 96% 98%  Height:      PainSc: 0-No pain  0-No pain 0-No pain  Isolation Precautions No active isolations  Medications Medications  sodium chloride flush (NS) 0.9 % injection 3 mL (3 mLs Intravenous Not Given 11/01/18 1946)  aspirin chewable tablet 324 mg (324 mg Oral Given 11/01/18 2042)    Mobility walks Moderate fall risk   Focused Assessments Faamily updated on patient's condition/admission plan.   R Recommendations: See Admitting Provider Note  Report given to:   Additional Notes:

## 2018-11-02 ENCOUNTER — Observation Stay (HOSPITAL_COMMUNITY): Payer: Medicare Other

## 2018-11-02 ENCOUNTER — Observation Stay (HOSPITAL_BASED_OUTPATIENT_CLINIC_OR_DEPARTMENT_OTHER): Payer: Medicare Other

## 2018-11-02 ENCOUNTER — Encounter (HOSPITAL_COMMUNITY): Payer: Self-pay

## 2018-11-02 DIAGNOSIS — Z23 Encounter for immunization: Secondary | ICD-10-CM | POA: Diagnosis not present

## 2018-11-02 DIAGNOSIS — I361 Nonrheumatic tricuspid (valve) insufficiency: Secondary | ICD-10-CM | POA: Diagnosis not present

## 2018-11-02 DIAGNOSIS — I5032 Chronic diastolic (congestive) heart failure: Secondary | ICD-10-CM | POA: Diagnosis not present

## 2018-11-02 DIAGNOSIS — R0789 Other chest pain: Secondary | ICD-10-CM | POA: Diagnosis not present

## 2018-11-02 DIAGNOSIS — E1169 Type 2 diabetes mellitus with other specified complication: Secondary | ICD-10-CM

## 2018-11-02 DIAGNOSIS — I129 Hypertensive chronic kidney disease with stage 1 through stage 4 chronic kidney disease, or unspecified chronic kidney disease: Secondary | ICD-10-CM

## 2018-11-02 DIAGNOSIS — N183 Chronic kidney disease, stage 3 unspecified: Secondary | ICD-10-CM | POA: Diagnosis not present

## 2018-11-02 DIAGNOSIS — R0602 Shortness of breath: Secondary | ICD-10-CM | POA: Diagnosis present

## 2018-11-02 DIAGNOSIS — R609 Edema, unspecified: Secondary | ICD-10-CM

## 2018-11-02 DIAGNOSIS — I371 Nonrheumatic pulmonary valve insufficiency: Secondary | ICD-10-CM

## 2018-11-02 DIAGNOSIS — I130 Hypertensive heart and chronic kidney disease with heart failure and stage 1 through stage 4 chronic kidney disease, or unspecified chronic kidney disease: Secondary | ICD-10-CM | POA: Diagnosis not present

## 2018-11-02 DIAGNOSIS — I5033 Acute on chronic diastolic (congestive) heart failure: Secondary | ICD-10-CM | POA: Diagnosis not present

## 2018-11-02 DIAGNOSIS — N179 Acute kidney failure, unspecified: Secondary | ICD-10-CM | POA: Diagnosis not present

## 2018-11-02 DIAGNOSIS — F419 Anxiety disorder, unspecified: Secondary | ICD-10-CM | POA: Diagnosis not present

## 2018-11-02 DIAGNOSIS — K219 Gastro-esophageal reflux disease without esophagitis: Secondary | ICD-10-CM | POA: Diagnosis not present

## 2018-11-02 DIAGNOSIS — Z8673 Personal history of transient ischemic attack (TIA), and cerebral infarction without residual deficits: Secondary | ICD-10-CM | POA: Diagnosis not present

## 2018-11-02 DIAGNOSIS — J9601 Acute respiratory failure with hypoxia: Secondary | ICD-10-CM | POA: Diagnosis not present

## 2018-11-02 DIAGNOSIS — Z923 Personal history of irradiation: Secondary | ICD-10-CM | POA: Diagnosis not present

## 2018-11-02 DIAGNOSIS — F329 Major depressive disorder, single episode, unspecified: Secondary | ICD-10-CM | POA: Diagnosis not present

## 2018-11-02 DIAGNOSIS — E785 Hyperlipidemia, unspecified: Secondary | ICD-10-CM

## 2018-11-02 DIAGNOSIS — Z66 Do not resuscitate: Secondary | ICD-10-CM | POA: Diagnosis not present

## 2018-11-02 DIAGNOSIS — Z85810 Personal history of malignant neoplasm of tongue: Secondary | ICD-10-CM | POA: Diagnosis not present

## 2018-11-02 DIAGNOSIS — H5462 Unqualified visual loss, left eye, normal vision right eye: Secondary | ICD-10-CM | POA: Diagnosis not present

## 2018-11-02 DIAGNOSIS — Z20828 Contact with and (suspected) exposure to other viral communicable diseases: Secondary | ICD-10-CM | POA: Diagnosis not present

## 2018-11-02 DIAGNOSIS — E1122 Type 2 diabetes mellitus with diabetic chronic kidney disease: Secondary | ICD-10-CM | POA: Diagnosis not present

## 2018-11-02 DIAGNOSIS — N189 Chronic kidney disease, unspecified: Secondary | ICD-10-CM

## 2018-11-02 DIAGNOSIS — E7800 Pure hypercholesterolemia, unspecified: Secondary | ICD-10-CM | POA: Diagnosis not present

## 2018-11-02 LAB — BASIC METABOLIC PANEL
Anion gap: 11 (ref 5–15)
BUN: 22 mg/dL (ref 8–23)
CO2: 22 mmol/L (ref 22–32)
Calcium: 8.6 mg/dL — ABNORMAL LOW (ref 8.9–10.3)
Chloride: 104 mmol/L (ref 98–111)
Creatinine, Ser: 1.77 mg/dL — ABNORMAL HIGH (ref 0.44–1.00)
GFR calc Af Amer: 30 mL/min — ABNORMAL LOW (ref >60)
GFR calc non Af Amer: 26 mL/min — ABNORMAL LOW (ref >60)
Glucose, Bld: 107 mg/dL — ABNORMAL HIGH (ref 70–99)
Potassium: 3.8 mmol/L (ref 3.5–5.1)
Sodium: 137 mmol/L (ref 135–145)

## 2018-11-02 LAB — LIPID PANEL
Cholesterol: 141 mg/dL (ref 0–200)
HDL: 40 mg/dL — ABNORMAL LOW (ref >40)
LDL Cholesterol: 71 mg/dL (ref 0–99)
Total CHOL/HDL Ratio: 3.5 RATIO
Triglycerides: 148 mg/dL (ref <150)
VLDL: 30 mg/dL (ref 0–40)

## 2018-11-02 LAB — CBC
HCT: 35.6 % — ABNORMAL LOW (ref 36.0–46.0)
Hemoglobin: 11.9 g/dL — ABNORMAL LOW (ref 12.0–15.0)
MCH: 30.5 pg (ref 26.0–34.0)
MCHC: 33.4 g/dL (ref 30.0–36.0)
MCV: 91.3 fL (ref 80.0–100.0)
Platelets: 200 10*3/uL (ref 150–400)
RBC: 3.9 MIL/uL (ref 3.87–5.11)
RDW: 14.1 % (ref 11.5–15.5)
WBC: 8.5 10*3/uL (ref 4.0–10.5)
nRBC: 0 % (ref 0.0–0.2)

## 2018-11-02 LAB — D-DIMER, QUANTITATIVE: D-Dimer, Quant: 2.32 ug/mL-FEU — ABNORMAL HIGH (ref 0.00–0.50)

## 2018-11-02 LAB — GLUCOSE, CAPILLARY
Glucose-Capillary: 92 mg/dL (ref 70–99)
Glucose-Capillary: 95 mg/dL (ref 70–99)
Glucose-Capillary: 109 mg/dL — ABNORMAL HIGH (ref 70–99)
Glucose-Capillary: 97 mg/dL (ref 70–99)

## 2018-11-02 LAB — ECHOCARDIOGRAM COMPLETE
Height: 62 in
Weight: 2352 oz

## 2018-11-02 LAB — TROPONIN I (HIGH SENSITIVITY)
Troponin I (High Sensitivity): 29 ng/L — ABNORMAL HIGH (ref <18)
Troponin I (High Sensitivity): 27 ng/L — ABNORMAL HIGH (ref <18)

## 2018-11-02 LAB — SARS CORONAVIRUS 2 (TAT 6-24 HRS): SARS Coronavirus 2: NEGATIVE

## 2018-11-02 LAB — BRAIN NATRIURETIC PEPTIDE: B Natriuretic Peptide: 244.8 pg/mL — ABNORMAL HIGH (ref 0.0–100.0)

## 2018-11-02 MED ORDER — HEPARIN SODIUM (PORCINE) 5000 UNIT/ML IJ SOLN
5000.0000 [IU] | Freq: Three times a day (TID) | INTRAMUSCULAR | Status: DC
  Administered 2018-11-02 – 2018-11-03 (×5): 5000 [IU] via SUBCUTANEOUS
  Filled 2018-11-02 (×5): qty 1

## 2018-11-02 MED ORDER — BRIMONIDINE TARTRATE 0.15 % OP SOLN
1.0000 [drp] | Freq: Two times a day (BID) | OPHTHALMIC | Status: DC
  Administered 2018-11-02 – 2018-11-03 (×3): 1 [drp] via OPHTHALMIC
  Filled 2018-11-02: qty 5

## 2018-11-02 MED ORDER — ONDANSETRON HCL 4 MG/2ML IJ SOLN: 4.0000 mg | Freq: Four times a day (QID) | INTRAMUSCULAR | Status: DC | PRN

## 2018-11-02 MED ORDER — ATORVASTATIN CALCIUM 10 MG PO TABS
20.0000 mg | ORAL_TABLET | Freq: Every day | ORAL | Status: DC
  Administered 2018-11-02: 20 mg via ORAL
  Filled 2018-11-02: qty 2

## 2018-11-02 MED ORDER — LATANOPROST 0.005 % OP SOLN
1.0000 [drp] | Freq: Every day | OPHTHALMIC | Status: DC
  Administered 2018-11-02 (×2): 1 [drp] via OPHTHALMIC
  Filled 2018-11-02: qty 2.5

## 2018-11-02 MED ORDER — AMLODIPINE BESYLATE 10 MG PO TABS
10.0000 mg | ORAL_TABLET | Freq: Every day | ORAL | Status: DC
  Administered 2018-11-02 – 2018-11-03 (×2): 10 mg via ORAL
  Filled 2018-11-02 (×2): qty 1

## 2018-11-02 MED ORDER — INFLUENZA VAC A&B SA ADJ QUAD 0.5 ML IM PRSY
0.5000 mL | PREFILLED_SYRINGE | INTRAMUSCULAR | Status: AC
  Administered 2018-11-03: 0.5 mL via INTRAMUSCULAR
  Filled 2018-11-02: qty 0.5

## 2018-11-02 MED ORDER — GABAPENTIN 100 MG PO CAPS: 100.0000 mg | ORAL_CAPSULE | ORAL | Status: DC | PRN

## 2018-11-02 MED ORDER — PANTOPRAZOLE SODIUM 40 MG PO TBEC
40.0000 mg | DELAYED_RELEASE_TABLET | Freq: Every day | ORAL | Status: DC
  Administered 2018-11-02 – 2018-11-03 (×2): 40 mg via ORAL
  Filled 2018-11-02 (×2): qty 1

## 2018-11-02 MED ORDER — ACETAMINOPHEN 325 MG PO TABS
650.0000 mg | ORAL_TABLET | ORAL | Status: DC | PRN
  Administered 2018-11-02: 650 mg via ORAL
  Filled 2018-11-02: qty 2

## 2018-11-02 MED ORDER — ASPIRIN 81 MG PO CHEW
81.0000 mg | CHEWABLE_TABLET | Freq: Every day | ORAL | Status: DC
  Administered 2018-11-02 – 2018-11-03 (×2): 81 mg via ORAL
  Filled 2018-11-02 (×2): qty 1

## 2018-11-02 MED ORDER — CARVEDILOL 12.5 MG PO TABS
12.5000 mg | ORAL_TABLET | Freq: Every day | ORAL | Status: DC
  Administered 2018-11-02 – 2018-11-03 (×2): 12.5 mg via ORAL
  Filled 2018-11-02 (×2): qty 1

## 2018-11-02 MED ORDER — SODIUM CHLORIDE 0.9 % IV SOLN
INTRAVENOUS | Status: DC
  Administered 2018-11-02: via INTRAVENOUS

## 2018-11-02 MED ORDER — FUROSEMIDE 10 MG/ML IJ SOLN
40.0000 mg | Freq: Every day | INTRAMUSCULAR | Status: DC
  Administered 2018-11-02 – 2018-11-03 (×2): 40 mg via INTRAVENOUS
  Filled 2018-11-02 (×2): qty 4

## 2018-11-02 MED ORDER — HYDRALAZINE HCL 20 MG/ML IJ SOLN
10.0000 mg | INTRAMUSCULAR | Status: DC | PRN
  Administered 2018-11-02 – 2018-11-03 (×2): 10 mg via INTRAVENOUS
  Filled 2018-11-02 (×2): qty 1

## 2018-11-02 MED ORDER — NITROGLYCERIN 0.4 MG SL SUBL: 0.4000 mg | SUBLINGUAL_TABLET | SUBLINGUAL | Status: DC | PRN

## 2018-11-02 MED ORDER — CARVEDILOL 12.5 MG PO TABS: 12.5000 mg | ORAL_TABLET | Freq: Every day | ORAL | Status: DC | PRN

## 2018-11-02 NOTE — Progress Notes (Signed)
BP was notified to DR patel , got and order of Hydralazine 10mg  IV. Pt denies lighthededness, no chest pain, no dizziness. Will monitor

## 2018-11-02 NOTE — Progress Notes (Addendum)
PROGRESS NOTE    [name]  DGL:[id number] DOB: Feb 16, 1932 DOA: 11/01/2018 PCP: Dierdre Harness, FNP  Brief Narrative: [name] is a 85 y.o. female with medical history significant for history of CVA, chronic diastolic CHF (EF 51-88%, C1YS by TTE 06/03/2017), CKD stage III, hypertension, hyperlipidemia, history of tongue cancer s/p radiation therapy who presents to the ED for evaluation of dyspnea and chest discomfort. Patient is a very poor historian reports ongoing dyspnea for a week, associated with chest pain which she describes as all across her chest but predominantly in both sides inframammary and axillary area, pleuritic. -She also has chronic heartburn and reflux -She underwent stress testing 06/05/2017 which showed a small area of inferior basal ischemia but was otherwise considered a low risk study. -In the emergency room she was noted to be hypoxic requiring 3 to 4 L of oxygen, chest x-ray showed low lung volumes without acute findings  Assessment & Plan:    Acute hypoxic respiratory failure -Atypical chest pain with a pleuritic component -Elevated D-dimer, 2.3 -High-sensitivity troponin around 22-27 range not consistent with ACS, I suspect this is secondary to demand, EKG with flat T waves in inferior lateral leads which is unchanged from prior, check 2D echocardiogram, assess wall motion, EF, PA pressures and RV -Clinically she appears slightly volume overloaded, will diurese with IV Lasix today, monitor volume status -Also suspect she also has underlying COPD from 60-pack-year history of smoking quit smoking 6 years ago, which is contributing to her hypoxemia -Check VQ scan and lower extremity Dopplers to rule out PE given hypoxemia with elevated D-dimer and vague pleuritic chest pain -Ambulate, wean O2  AKI on CKD 3 -Suspect this is cardiorenal, stopped lisinopril, monitor with diuresis -Baseline creatinine around 1.5  Acute on chronic diastolic CHF -As  above  Type 2 diabetes mellitus -Diet controlled  Hypertension, uncontrolled -Continue amlodipine and beta-blockers, anticipate this to improve with diuresis -Lisinopril on hold  Suspected COPD -60+-pack-year history of smoking, quit smoking 6 years ago, will need ambulatory O2 sats -Nebs as needed, no wheezing at this time  DVT prophylaxis: Heparin subcutaneous Code Status: Full code Family Communication: No family at bedside will update daughter later today Disposition Plan: Home when improved, currently remains dyspneic with chest pain requiring 4 L of oxygen not on any O2 at baseline  Consultants:    Procedures:   Antimicrobials:    Subjective: -Remains short of breath and has intermittent chest pain across her chest predominantly on both sides  Objective: Vitals:   11/02/18 0336 11/02/18 0545 11/02/18 0747 11/02/18 1124  BP: (!) 203/76 (!) 155/95 (!) 181/76 (!) 155/66  Pulse: 72 65 72 (!) 59  Resp: 17 17 17 20   Temp: 98.2 F (36.8 C) 98.3 F (36.8 C) 98.4 F (36.9 C) 97.8 F (36.6 C)  TempSrc: Oral Oral Oral Oral  SpO2: 100% 100% 100% 100%  Weight:      Height:        Intake/Output Summary (Last 24 hours) at 11/02/2018 1208 Last data filed at 11/02/2018 0847 Gross per 24 hour  Intake 935.11 ml  Output 400 ml  Net 535.11 ml   Filed Weights   11/02/18 0007  Weight: 66.7 kg    Examination:  General exam: Chronically ill elderly female sitting up in bed, AAO x2 Respiratory system: Poor air movement bilaterally, few basilar rales cardiovascular system: S1 & S2 heard,  Gastrointestinal system: Abdomen is nondistended, soft and nontender.Normal bowel sounds heard. Central nervous system: Alert and  oriented. No focal neurological deficits. Extremities: Trace edema Skin: No rashes, lesions or ulcers Psychiatry: Poor insight and judgment    Data Reviewed:   CBC: Recent Labs  Lab 11/01/18 1940 11/02/18 0358  WBC 8.7 8.5  HGB 12.3 11.9*  HCT  37.7 35.6*  MCV 93.1 91.3  PLT 201 970   Basic Metabolic Panel: Recent Labs  Lab 11/01/18 1940 11/02/18 0358  NA 133* 137  K 3.8 3.8  CL 101 104  CO2 19* 22  GLUCOSE 113* 107*  BUN 23 22  CREATININE 2.12* 1.77*  CALCIUM 8.7* 8.6*   GFR: Estimated Creatinine Clearance: 21.2 mL/min (A) (by C-G formula based on SCr of 1.77 mg/dL (H)). Liver Function Tests: No results for input(s): AST, ALT, ALKPHOS, BILITOT, PROT, ALBUMIN in the last 168 hours. No results for input(s): LIPASE, AMYLASE in the last 168 hours. No results for input(s): AMMONIA in the last 168 hours. Coagulation Profile: No results for input(s): INR, PROTIME in the last 168 hours. Cardiac Enzymes: No results for input(s): CKTOTAL, CKMB, CKMBINDEX, TROPONINI in the last 168 hours. BNP (last 3 results) Recent Labs    01/27/18 1516  PROBNP 719   HbA1C: No results for input(s): HGBA1C in the last 72 hours. CBG: Recent Labs  Lab 11/02/18 0547 11/02/18 1126  GLUCAP 92 95   Lipid Profile: Recent Labs    11/02/18 0358  CHOL 141  HDL 40*  LDLCALC 71  TRIG 148  CHOLHDL 3.5   Thyroid Function Tests: No results for input(s): TSH, T4TOTAL, FREET4, T3FREE, THYROIDAB in the last 72 hours. Anemia Panel: No results for input(s): VITAMINB12, FOLATE, FERRITIN, TIBC, IRON, RETICCTPCT in the last 72 hours. Urine analysis:    Component Value Date/Time   COLORURINE YELLOW 06/02/2017 2110   APPEARANCEUR HAZY (A) 06/02/2017 2110   LABSPEC 1.005 06/02/2017 2110   PHURINE 7.0 06/02/2017 2110   GLUCOSEU NEGATIVE 06/02/2017 2110   HGBUR SMALL (A) 06/02/2017 2110   BILIRUBINUR NEGATIVE 06/02/2017 2110   KETONESUR NEGATIVE 06/02/2017 2110   PROTEINUR 100 (A) 06/02/2017 2110   UROBILINOGEN 0.2 02/07/2014 2241   NITRITE POSITIVE (A) 06/02/2017 2110   LEUKOCYTESUR MODERATE (A) 06/02/2017 2110   Sepsis Labs: @LABRCNTIP (procalcitonin:4,lacticidven:4)  ) Recent Results (from the past 240 hour(s))  SARS CORONAVIRUS 2  (TAT 6-24 HRS) Nasopharyngeal Nasopharyngeal Swab     Status: None   Collection Time: 11/01/18  9:56 PM   Specimen: Nasopharyngeal Swab  Result Value Ref Range Status   SARS Coronavirus 2 NEGATIVE NEGATIVE Final    Comment: (NOTE) SARS-CoV-2 target nucleic acids are NOT DETECTED. The SARS-CoV-2 RNA is generally detectable in upper and lower respiratory specimens during the acute phase of infection. Negative results do not preclude SARS-CoV-2 infection, do not rule out co-infections with other pathogens, and should not be used as the sole basis for treatment or other patient management decisions. Negative results must be combined with clinical observations, patient history, and epidemiological information. The expected result is Negative. Fact Sheet for Patients: SugarRoll.be Fact Sheet for Healthcare Providers: https://www.woods-mathews.com/ This test is not yet approved or cleared by the [place] FDA and  has been authorized for detection and/or diagnosis of SARS-CoV-2 by FDA under an Emergency Use Authorization (EUA). This EUA will remain  in effect (meaning this test can be used) for the duration of the COVID-19 declaration under Section 56 4(b)(1) of the Act, 21 U.S.C. section 360bbb-3(b)(1), unless the authorization is terminated or revoked sooner. Performed at [facility], [address] [place], [place] [address]          Radiology Studies: Dg Chest 2 View  Result Date: 11/01/2018 CLINICAL DATA:  Chest pain EXAM: CHEST - 2 VIEW COMPARISON:  02/21/2018 FINDINGS: Mildly low lung volumes. No acute consolidation or pleural effusion mild cardiomegaly with aortic atherosclerosis. No pneumothorax. Tortuous aorta. Chronic mild wedging deformities of midthoracic vertebra and at the thoracolumbar junction. IMPRESSION: No active cardiopulmonary disease. Low lung volumes with mild cardiomegaly. Electronically Signed   By: Donavan Foil M.D.   On: 11/01/2018 20:27   Nm Pulmonary Perfusion  Result Date: 11/02/2018 CLINICAL DATA:  Tongue cancer post radiation therapy, chest pain, shortness of breath, elevated D-dimer EXAM: NUCLEAR MEDICINE PERFUSION LUNG SCAN TECHNIQUE: Perfusion images were obtained in multiple projections after intravenous injection of radiopharmaceutical. Ventilation scans intentionally deferred if perfusion scan and chest x-ray adequate for interpretation during COVID 19 epidemic. RADIOPHARMACEUTICALS:  1.5 mCi Tc-75m MAA IV COMPARISON:  06/03/2017 Correlation: Chest radiograph 11/01/2018 FINDINGS: Normal perfusion in both lungs. No segmental or subsegmental perfusion defects. IMPRESSION: Normal perfusion lung scan. Electronically Signed   By: Lavonia Dana M.D.   On: 11/02/2018 11:41        Scheduled Meds: . amLODipine  10 mg Oral Daily  . aspirin  81 mg Oral Daily  . atorvastatin  20 mg Oral q1800  . brimonidine  1 drop Right Eye BID  . carvedilol  12.5 mg Oral Q breakfast  . furosemide  40 mg Intravenous Daily  . heparin  5,000 Units Subcutaneous Q8H  . [START ON 11/03/2018] influenza vaccine adjuvanted  0.5 mL Intramuscular Tomorrow-1000  . latanoprost  1 drop Right Eye QHS  . pantoprazole  40 mg Oral Daily  . sodium chloride flush  3 mL Intravenous Once   Continuous Infusions:   LOS: 0 days    Time spent: 23min    Domenic Polite, MD [facility]  11/02/2018, 12:08 PM

## 2018-11-02 NOTE — Progress Notes (Signed)
Bilateral lower extremity venous duplex complete.  Please see CV Proc tab for preliminary results. Merkel, RVT 3:52 PM  11/02/2018

## 2018-11-02 NOTE — Progress Notes (Signed)
  Echocardiogram 2D Echocardiogram has been performed.  [name] 11/02/2018, 2:09 PM

## 2018-11-03 DIAGNOSIS — Z79899 Other long term (current) drug therapy: Secondary | ICD-10-CM | POA: Diagnosis not present

## 2018-11-03 DIAGNOSIS — R0789 Other chest pain: Secondary | ICD-10-CM | POA: Diagnosis not present

## 2018-11-03 DIAGNOSIS — F419 Anxiety disorder, unspecified: Secondary | ICD-10-CM | POA: Diagnosis not present

## 2018-11-03 DIAGNOSIS — K219 Gastro-esophageal reflux disease without esophagitis: Secondary | ICD-10-CM | POA: Diagnosis not present

## 2018-11-03 DIAGNOSIS — Z66 Do not resuscitate: Secondary | ICD-10-CM | POA: Diagnosis not present

## 2018-11-03 DIAGNOSIS — R0602 Shortness of breath: Secondary | ICD-10-CM | POA: Diagnosis not present

## 2018-11-03 DIAGNOSIS — N179 Acute kidney failure, unspecified: Secondary | ICD-10-CM | POA: Diagnosis not present

## 2018-11-03 DIAGNOSIS — J9601 Acute respiratory failure with hypoxia: Secondary | ICD-10-CM | POA: Diagnosis not present

## 2018-11-03 DIAGNOSIS — R079 Chest pain, unspecified: Secondary | ICD-10-CM | POA: Diagnosis not present

## 2018-11-03 DIAGNOSIS — F329 Major depressive disorder, single episode, unspecified: Secondary | ICD-10-CM | POA: Diagnosis not present

## 2018-11-03 DIAGNOSIS — Z87891 Personal history of nicotine dependence: Secondary | ICD-10-CM | POA: Diagnosis not present

## 2018-11-03 DIAGNOSIS — Z8673 Personal history of transient ischemic attack (TIA), and cerebral infarction without residual deficits: Secondary | ICD-10-CM | POA: Diagnosis not present

## 2018-11-03 DIAGNOSIS — I130 Hypertensive heart and chronic kidney disease with heart failure and stage 1 through stage 4 chronic kidney disease, or unspecified chronic kidney disease: Secondary | ICD-10-CM | POA: Diagnosis not present

## 2018-11-03 DIAGNOSIS — H5462 Unqualified visual loss, left eye, normal vision right eye: Secondary | ICD-10-CM | POA: Diagnosis not present

## 2018-11-03 DIAGNOSIS — I5033 Acute on chronic diastolic (congestive) heart failure: Secondary | ICD-10-CM | POA: Diagnosis not present

## 2018-11-03 DIAGNOSIS — E7800 Pure hypercholesterolemia, unspecified: Secondary | ICD-10-CM | POA: Diagnosis not present

## 2018-11-03 DIAGNOSIS — E785 Hyperlipidemia, unspecified: Secondary | ICD-10-CM | POA: Diagnosis not present

## 2018-11-03 DIAGNOSIS — Z7982 Long term (current) use of aspirin: Secondary | ICD-10-CM | POA: Diagnosis not present

## 2018-11-03 DIAGNOSIS — N183 Chronic kidney disease, stage 3 unspecified: Secondary | ICD-10-CM | POA: Diagnosis not present

## 2018-11-03 DIAGNOSIS — Z20828 Contact with and (suspected) exposure to other viral communicable diseases: Secondary | ICD-10-CM | POA: Diagnosis not present

## 2018-11-03 DIAGNOSIS — Z923 Personal history of irradiation: Secondary | ICD-10-CM | POA: Diagnosis not present

## 2018-11-03 DIAGNOSIS — Z23 Encounter for immunization: Secondary | ICD-10-CM | POA: Diagnosis present

## 2018-11-03 DIAGNOSIS — Z85810 Personal history of malignant neoplasm of tongue: Secondary | ICD-10-CM | POA: Diagnosis not present

## 2018-11-03 DIAGNOSIS — Z833 Family history of diabetes mellitus: Secondary | ICD-10-CM | POA: Diagnosis not present

## 2018-11-03 LAB — GLUCOSE, CAPILLARY
Glucose-Capillary: 93 mg/dL (ref 70–99)
Glucose-Capillary: 120 mg/dL — ABNORMAL HIGH (ref 70–99)
Glucose-Capillary: 140 mg/dL — ABNORMAL HIGH (ref 70–99)

## 2018-11-03 LAB — CBC
HCT: 35 % — ABNORMAL LOW (ref 36.0–46.0)
Hemoglobin: 11.5 g/dL — ABNORMAL LOW (ref 12.0–15.0)
MCH: 30 pg (ref 26.0–34.0)
MCHC: 32.9 g/dL (ref 30.0–36.0)
MCV: 91.4 fL (ref 80.0–100.0)
Platelets: 192 10*3/uL (ref 150–400)
RBC: 3.83 MIL/uL — ABNORMAL LOW (ref 3.87–5.11)
RDW: 14 % (ref 11.5–15.5)
WBC: 6.3 10*3/uL (ref 4.0–10.5)
nRBC: 0 % (ref 0.0–0.2)

## 2018-11-03 LAB — BASIC METABOLIC PANEL
Anion gap: 10 (ref 5–15)
BUN: 21 mg/dL (ref 8–23)
CO2: 24 mmol/L (ref 22–32)
Calcium: 8.8 mg/dL — ABNORMAL LOW (ref 8.9–10.3)
Chloride: 106 mmol/L (ref 98–111)
Creatinine, Ser: 1.54 mg/dL — ABNORMAL HIGH (ref 0.44–1.00)
GFR calc Af Amer: 36 mL/min — ABNORMAL LOW (ref >60)
GFR calc non Af Amer: 31 mL/min — ABNORMAL LOW (ref >60)
Glucose, Bld: 92 mg/dL (ref 70–99)
Potassium: 4.1 mmol/L (ref 3.5–5.1)
Sodium: 140 mmol/L (ref 135–145)

## 2018-11-03 MED ORDER — CARVEDILOL 12.5 MG PO TABS
12.5000 mg | ORAL_TABLET | Freq: Once | ORAL | Status: AC
  Administered 2018-11-03: 12.5 mg via ORAL
  Filled 2018-11-03: qty 1

## 2018-11-03 MED ORDER — ISOSORBIDE MONONITRATE ER 30 MG PO TB24
30.0000 mg | ORAL_TABLET | Freq: Every day | ORAL | Status: DC
  Administered 2018-11-03: 30 mg via ORAL
  Filled 2018-11-03: qty 1

## 2018-11-03 MED ORDER — ISOSORBIDE MONONITRATE ER 30 MG PO TB24: 30.0000 mg | ORAL_TABLET | Freq: Every day | ORAL | 0 refills | Status: DC

## 2018-11-03 MED ORDER — CARVEDILOL 25 MG PO TABS: 25.0000 mg | ORAL_TABLET | Freq: Two times a day (BID) | ORAL | 0 refills | Status: DC

## 2018-11-03 MED ORDER — CARVEDILOL 25 MG PO TABS: 25.0000 mg | ORAL_TABLET | Freq: Two times a day (BID) | ORAL | Status: DC

## 2018-11-03 NOTE — Progress Notes (Signed)
Discussed discharge instructions, including medications and follow up appointments.  Encouraged patient to check BP and weight daily.

## 2018-11-03 NOTE — Evaluation (Signed)
Physical Therapy Evaluation Patient Details Name: [name] MRN: [id number] DOB: 1932-09-12 Today's Date: 11/03/2018   History of Present Illness  85yo female c/o chest pain, SOB unchanged with rest. Admitted for acute hypoxic respiratory failure and atypical chest pain. PMH CVA, CHF, CKD, HTN, HLD, tongue CA s/p radiation, L eye blind, hip fracture L s/p THA, DM  Clinical Impression   Patient received in bed, very pleasant and willing to participate with therapy. Able to complete bed mobility with Mod(I), functional transfers with S and SPC, and gait approximately 55f with SPC and min guard, mild balance deficits noted but able to self correct without assistance. She was left at EOB with all needs met and questions/concerns addressed this morning. She will continue to benefit from skilled PT services in the acute setting, currently recommending skilled OP PT services moving forward.     Follow Up Recommendations Outpatient PT    Equipment Recommendations  None recommended by PT    Recommendations for Other Services       Precautions / Restrictions Precautions Precautions: Fall;Other (comment) Precaution Comments: blind L eye Restrictions Weight Bearing Restrictions: No      Mobility  Bed Mobility Overal bed mobility: Modified Independent                Transfers Overall transfer level: Needs assistance Equipment used: Straight cane Transfers: Sit to/from Stand Sit to Stand: Supervision         General transfer comment: S for safety, no physical assist given  Ambulation/Gait Ambulation/Gait assistance: Min guard Gait Distance (Feet): 70 Feet Assistive device: Straight cane Gait Pattern/deviations: Step-through pattern;Decreased step length - right;Decreased step length - left;Drifts right/left Gait velocity: decreased   General Gait Details: generally WNL with gait with SPC, mild unsteadiness but able to correct independently. SpO2 96% with gait on  room air.  Stairs            Wheelchair Mobility    Modified Rankin (Stroke Patients Only)       Balance Overall balance assessment: Mild deficits observed, not formally tested                                           Pertinent Vitals/Pain Pain Assessment: No/denies pain    Home Living Family/patient expects to be discharged to:: Private residence Living Arrangements: Alone Available Help at Discharge: Family;Available PRN/intermittently Type of Home: House Home Access: Stairs to enter Entrance Stairs-Rails: None Entrance Stairs-Number of Steps: 2 Home Layout: One level Home Equipment: Shower seat - built in;Toilet riser;Walker - 4 wheels      Prior Function Level of Independence: Independent         Comments: pt reports she uses a quad cane     Hand Dominance   Dominant Hand: Right    Extremity/Trunk Assessment   Upper Extremity Assessment Upper Extremity Assessment: Generalized weakness    Lower Extremity Assessment Lower Extremity Assessment: Generalized weakness    Cervical / Trunk Assessment Cervical / Trunk Assessment: Kyphotic  Communication   Communication: No difficulties  Cognition Arousal/Alertness: Awake/alert Behavior During Therapy: WFL for tasks assessed/performed Overall Cognitive Status: Within Functional Limits for tasks assessed                                        General  Comments      Exercises     Assessment/Plan    PT Assessment Patient needs continued PT services  PT Problem List Decreased strength;Obesity;Decreased activity tolerance;Decreased balance;Decreased coordination       PT Treatment Interventions DME instruction;Balance training;Gait training;Neuromuscular re-education;Stair training;Functional mobility training;Patient/family education;Therapeutic activities;Therapeutic exercise    PT Goals (Current goals can be found in the Care Plan section)  Acute Rehab PT  Goals Patient Stated Goal: go home PT Goal Formulation: With patient Time For Goal Achievement: 11/17/18 Potential to Achieve Goals: Good    Frequency Min 3X/week   Barriers to discharge        Co-evaluation               AM-PAC PT "6 Clicks" Mobility  Outcome Measure Help needed turning from your back to your side while in a flat bed without using bedrails?: None Help needed moving from lying on your back to sitting on the side of a flat bed without using bedrails?: None Help needed moving to and from a bed to a chair (including a wheelchair)?: None Help needed standing up from a chair using your arms (e.g., wheelchair or bedside chair)?: None Help needed to walk in hospital room?: A Little Help needed climbing 3-5 steps with a railing? : A Little 6 Click Score: 22    End of Session   Activity Tolerance: Patient tolerated treatment well Patient left: in bed;with call bell/phone within reach   PT Visit Diagnosis: Unsteadiness on feet (R26.81);Muscle weakness (generalized) (M62.81)    Time: 9444-6190 PT Time Calculation (min) (ACUTE ONLY): 16 min   Charges:   PT Evaluation $PT Eval Low Complexity: 1 Low          Deniece Ree PT, DPT, CBIS  Supplemental Physical Therapist [place]    Pager [phone] Acute Rehab Office [phone]

## 2018-11-03 NOTE — Discharge Summary (Signed)
Physician Discharge Summary  [name] WNI:[id number] DOB: 02-Feb-1932 DOA: 11/01/2018  PCP: Dierdre Harness, FNP  Admit date: 11/01/2018 Discharge date: 11/03/2018  Time spent: 40 minutes  Recommendations for Outpatient Follow-up:  1. Follow outpatient CBC/CMP  2. Follow up with cardiology outpatient for atypical chest pain - imdur started and coreg increased 3. Follow HTN outpatient - significantly elevated at times here, BP meds adjusted - lisinopril currently on hold with AKI 4. Consider PFT's outpatient  5. Follow renal function - lisinopril on hold due to AKI - follow for resumption   Discharge Diagnoses:  Principal Problem:   Chest pain Active Problems:   Chronic diastolic heart failure (HCC)   Type 2 diabetes mellitus with vascular disease ([place])   Hypertension associated with stage 3 chronic kidney disease due to type 2 diabetes mellitus ([place])   Dyslipidemia associated with type 2 diabetes mellitus ([place])   Acute kidney injury superimposed on chronic kidney disease ([place])   Discharge Condition: stable  Diet recommendation: heart healthy  Filed Weights   11/02/18 0007 11/03/18 0656  Weight: 66.7 kg 65.8 kg    History of present illness:  [name] 85 y.o.femalewith medical history significant forhistory of CVA, chronic diastolic CHF (EF 38-18%,E9HB by TTE 06/03/2017), CKD stage III, hypertension, hyperlipidemia, history of tongue cancer s/p radiation therapy who presents to the ED for evaluation of dyspnea and chest discomfort. Patient is [name] very poor historian reports ongoing dyspnea for [name] week, associated with chest pain which she describes as all across her chest but predominantly in both sides inframammary and axillary area, pleuritic. -She also has chronic heartburn and reflux -She underwent stress testing 06/05/2017 which showed [name] small area of inferior basal ischemia but was otherwise considered [name] low risk study. -In the emergency room she  was noted to be hypoxic requiring 3 to 4 L of oxygen, chest x-ray showed low lung volumes without acute findings  She was admitted for chest pain rule out.  She had normal VQ scan and CXR.  High sensitivity troponins were not suggestive of ACS.  She had EKG with T wave inversions.  She had low risk stress test in 2019, but did have small area of inferior basal ischemia in 2019.  Discussed case with cardiology who recommended starting imdur and coreg with outpatient follow up (continue ASA, statin).  She was feeling well at the time of discharge.  Plan for cards follow up.    Hospital Course:  Acute hypoxic respiratory failure -Atypical chest pain with [name] pleuritic component -Elevated D-dimer, 2.3 -negative V/Q scan and LE dopplers. she's weaned off O2 - echo without evidence of WMA, normal EF -High-sensitivity troponin around 22-27 range not consistent with ACS - EKG did have T wave inversions V4-V6 and III, aVF.  Her stress test from 2019 showed small area of inferior basal ischemia.  Discussed case with cardiology given abnormal EKG and previous stress test with small area of ischemia.  [name] recommended adding imdur, increasing coreg, and medically managing with plan for outpatient follow up.   - she was diuresed for suspect overload.  Appears euvolemic at the time of my evaluation at discharge. -Also suspect she also has underlying COPD from 60-pack-year history of smoking quit smoking 6 years ago, which is contributing to her hypoxemia -  Consider outpatient PFT's  AKI on CKD 3 -Suspect this is cardiorenal, stopped lisinopril, monitor with diuresis -Baseline creatinine around 1.5 - improved on discharge - follow for resumption of lisinopril (on  hold at time of discharge)  Acute on chronic diastolic CHF -As above -euvolemic on d/c, will d/c without additional lasix  Type 2 diabetes mellitus -Diet controlled  Hypertension, uncontrolled -Continue amlodipine - increase coreg to  25 mg BID and add imdur - continue to follow outpatient  -Lisinopril on hold (follow for resumption outpatient)  Suspected COPD -60+-pack-year history of smoking, quit smoking 6 years ago -Nebs as needed, no wheezing at this time - consider outpatient PFT's   Procedures: EchoIMPRESSIONS    1. Left ventricular ejection fraction, by visual estimation, is 60 to 65%. The left ventricle has normal function. Normal left ventricular size. Left ventricular septal wall thickness was mildly increased. Mildly increased left ventricular posterior  wall thickness.  2. Left ventricular diastolic Doppler parameters are consistent with impaired relaxation pattern of LV diastolic filling.  3. Elevated left ventricular end-diastolic pressure.  4. Global right ventricle has normal systolic function.The right ventricular size is normal. No increase in right ventricular wall thickness.  5. Left atrial size was moderately dilated.  6. Right atrial size was normal.  7. Moderate mitral annular calcification.  8. The mitral valve is normal in structure. Trace mitral valve regurgitation. No evidence of mitral stenosis.  9. The tricuspid valve is normal in structure. Tricuspid valve regurgitation is mild. 10. The aortic valve is tricuspid. Aortic valve regurgitation is trivial by color flow Doppler. Moderate aortic valve sclerosis/calcification without any evidence of aortic stenosis. 11. The pulmonic valve was normal in structure. Pulmonic valve regurgitation is mild by color flow Doppler. 12. Normal pulmonary artery systolic pressure. 13. The inferior vena cava is normal in size with greater than 50% respiratory variability, suggesting right atrial pressure of 3 mmHg.  LE [place] Summary: Right: There is no evidence of deep vein thrombosis in the lower extremity. No cystic structure found in the popliteal fossa. Left: There is no evidence of deep vein thrombosis in the lower extremity. No cystic structure found  in the popliteal fossa.  Consultations:  Cards over phone  Discharge Exam: Vitals:   11/03/18 1027 11/03/18 1130  BP: (!) 194/86 140/65  Pulse: 79 79  Resp:  20  Temp: 98.7 F (37.1 C) 98.5 F (36.9 C)  SpO2: 96% 96%   Feels well, ready for discharge Discuss discharge plan  General: No acute distress. Cardiovascular: Heart sounds show [name] regular rate, and rhythm.  Lungs: Clear to auscultation bilaterally Abdomen: Soft, nontender, nondistended  Neurological: Alert and oriented 3. Moves all extremities 4 . Cranial nerves II through XII grossly intact. Skin: Warm and dry. No rashes or lesions. Extremities: No clubbing or cyanosis. No edema.   Discharge Instructions   Discharge Instructions    Call MD for:  difficulty breathing, headache or visual disturbances   Complete by: As directed    Call MD for:  extreme fatigue   Complete by: As directed    Call MD for:  hives   Complete by: As directed    Call MD for:  persistant dizziness or light-headedness   Complete by: As directed    Call MD for:  persistant nausea and vomiting   Complete by: As directed    Call MD for:  redness, tenderness, or signs of infection (pain, swelling, redness, odor or green/yellow discharge around incision site)   Complete by: As directed    Call MD for:  severe uncontrolled pain   Complete by: As directed    Call MD for:  temperature >100.4   Complete by: As  directed    Diet - low sodium heart healthy   Complete by: As directed    Discharge instructions   Complete by: As directed    You were seen for chest pain.  Your workup was generally reassuring, but you should have close follow up with cardiology as an outpatient to discuss the need for additional work up or adjustment of your medications.  Your blood pressure was high.  We've increased your coreg to 25 mg twice daily and have added imdur.  Your lisinopril was placed on hold due to acute kidney injury (this is improving).  Please hold  this until you follow up with your PCP or cardiologist to determine when this can be resumed (they'll need to follow up labs).  Return for new, recurrent, or worsening symptoms.  Please ask your PCP to request records from this hospitalization so they know what was done and what the next steps will be.   Increase activity slowly   Complete by: As directed      Allergies as of 11/03/2018   No Known Allergies     Medication List    STOP taking these medications   lisinopril 40 MG tablet Commonly known as: ZESTRIL     TAKE these medications   acetaminophen 325 MG tablet Commonly known as: TYLENOL Take 2 tablets (650 mg total) by mouth every 6 (six) hours as needed for mild pain or fever (or temp > 37.5 C (99.5 F)).   Alphagan P 0.1 % Soln Generic drug: brimonidine Place 1 drop into the right eye 2 (two) times daily.   amLODipine 10 MG tablet Commonly known as: NORVASC Take 10 mg by mouth daily.   aspirin 81 MG tablet Take 81 mg by mouth daily.   atorvastatin 20 MG tablet Commonly known as: LIPITOR take 1 tablet by mouth once daily (CHANGING FROM SIMVASTATIN) What changed: See the new instructions.   carvedilol 25 MG tablet Commonly known as: COREG Take 1 tablet (25 mg total) by mouth 2 (two) times daily with [name] meal. Take one daily if she does not feel better she will take another at about 2pm What changed:   medication strength  how much to take  when to take this   folic acid 1 MG tablet Commonly known as: FOLVITE Take 1 tablet (1 mg total) by mouth daily.   gabapentin 100 MG capsule Commonly known as: NEURONTIN Take 100 mg by mouth as needed (pain).   isosorbide mononitrate 30 MG 24 hr tablet Commonly known as: IMDUR Take 1 tablet (30 mg total) by mouth daily. Start taking on: November 04, 2018   loratadine 10 MG tablet Commonly known as: CLARITIN Take 10 mg by mouth daily.   multivitamin with minerals Tabs tablet Take 1 tablet by mouth daily.    omeprazole 20 MG capsule Commonly known as: PRILOSEC Take 1 capsule (20 mg total) by mouth daily.   Travatan Z 0.004 % Soln ophthalmic solution Generic drug: Travoprost (BAK Free) Place 1 drop into the right eye at bedtime.      No Known Allergies    The results of significant diagnostics from this hospitalization (including imaging, microbiology, ancillary and laboratory) are listed below for reference.    Significant Diagnostic Studies: Dg Chest 2 View  Result Date: 11/01/2018 CLINICAL DATA:  Chest pain EXAM: CHEST - 2 VIEW COMPARISON:  02/21/2018 FINDINGS: Mildly low lung volumes. No acute consolidation or pleural effusion mild cardiomegaly with aortic atherosclerosis. No pneumothorax. Tortuous aorta. Chronic  mild wedging deformities of midthoracic vertebra and at the thoracolumbar junction. IMPRESSION: No active cardiopulmonary disease. Low lung volumes with mild cardiomegaly. Electronically Signed   By: Donavan Foil M.D.   On: 11/01/2018 20:27   Nm Pulmonary Perfusion  Result Date: 11/02/2018 CLINICAL DATA:  Tongue cancer post radiation therapy, chest pain, shortness of breath, elevated D-dimer EXAM: NUCLEAR MEDICINE PERFUSION LUNG SCAN TECHNIQUE: Perfusion images were obtained in multiple projections after intravenous injection of radiopharmaceutical. Ventilation scans intentionally deferred if perfusion scan and chest x-ray adequate for interpretation during COVID 19 epidemic. RADIOPHARMACEUTICALS:  1.5 mCi Tc-53m MAA IV COMPARISON:  06/03/2017 Correlation: Chest radiograph 11/01/2018 FINDINGS: Normal perfusion in both lungs. No segmental or subsegmental perfusion defects. IMPRESSION: Normal perfusion lung scan. Electronically Signed   By: Lavonia Dana M.D.   On: 11/02/2018 11:41   Vas [place] Lower Extremity Venous (dvt)  Result Date: 11/02/2018  Lower Venous Study Indications: Edema.  Performing Technologist: [name] RDMS, RVT  Examination Guidelines: [name] complete evaluation  includes B-mode imaging, spectral Doppler, color Doppler, and power Doppler as needed of all accessible portions of each vessel. Bilateral testing is considered an integral part of [name] complete examination. Limited examinations for reoccurring indications may be performed as noted.  +---------+---------------+---------+-----------+----------+--------------+ RIGHT    CompressibilityPhasicitySpontaneityPropertiesThrombus Aging +---------+---------------+---------+-----------+----------+--------------+ CFV      Full           Yes      Yes                                 +---------+---------------+---------+-----------+----------+--------------+ SFJ      Full                                                        +---------+---------------+---------+-----------+----------+--------------+ FV Prox  Full                                                        +---------+---------------+---------+-----------+----------+--------------+ FV Mid   Full                                                        +---------+---------------+---------+-----------+----------+--------------+ FV DistalFull                                                        +---------+---------------+---------+-----------+----------+--------------+ PFV      Full                                                        +---------+---------------+---------+-----------+----------+--------------+ POP      Full  Yes      Yes                                 +---------+---------------+---------+-----------+----------+--------------+ PTV      Full                                                        +---------+---------------+---------+-----------+----------+--------------+ PERO     Full                                                        +---------+---------------+---------+-----------+----------+--------------+ GSV      Full                                                         +---------+---------------+---------+-----------+----------+--------------+   +---------+---------------+---------+-----------+----------+--------------+ LEFT     CompressibilityPhasicitySpontaneityPropertiesThrombus Aging +---------+---------------+---------+-----------+----------+--------------+ CFV      Full           Yes      Yes                                 +---------+---------------+---------+-----------+----------+--------------+ SFJ      Full                                                        +---------+---------------+---------+-----------+----------+--------------+ FV Prox  Full                                                        +---------+---------------+---------+-----------+----------+--------------+ FV Mid   Full                                                        +---------+---------------+---------+-----------+----------+--------------+ FV DistalFull                                                        +---------+---------------+---------+-----------+----------+--------------+ PFV      Full                                                        +---------+---------------+---------+-----------+----------+--------------+  POP      Full           Yes      Yes                                 +---------+---------------+---------+-----------+----------+--------------+ PTV      Full                                                        +---------+---------------+---------+-----------+----------+--------------+ PERO     Full                                                        +---------+---------------+---------+-----------+----------+--------------+ GSV      Full                                                        +---------+---------------+---------+-----------+----------+--------------+     Summary: Right: There is no evidence of deep vein thrombosis in the lower extremity. No cystic structure found in  the popliteal fossa. Left: There is no evidence of deep vein thrombosis in the lower extremity. No cystic structure found in the popliteal fossa.  *See table(s) above for measurements and observations.    Preliminary     Microbiology: Recent Results (from the past 240 hour(s))  SARS CORONAVIRUS 2 (TAT 6-24 HRS) Nasopharyngeal Nasopharyngeal Swab     Status: None   Collection Time: 11/01/18  9:56 PM   Specimen: Nasopharyngeal Swab  Result Value Ref Range Status   SARS Coronavirus 2 NEGATIVE NEGATIVE Final    Comment: (NOTE) SARS-CoV-2 target nucleic acids are NOT DETECTED. The SARS-CoV-2 RNA is generally detectable in upper and lower respiratory specimens during the acute phase of infection. Negative results do not preclude SARS-CoV-2 infection, do not rule out co-infections with other pathogens, and should not be used as the sole basis for treatment or other patient management decisions. Negative results must be combined with clinical observations, patient history, and epidemiological information. The expected result is Negative. Fact Sheet for Patients: SugarRoll.be Fact Sheet for Healthcare Providers: https://www.woods-mathews.com/ This test is not yet approved or cleared by the [place] FDA and  has been authorized for detection and/or diagnosis of SARS-CoV-2 by FDA under an Emergency Use Authorization (EUA). This EUA will remain  in effect (meaning this test can be used) for the duration of the COVID-19 declaration under Section 56 4(b)(1) of the Act, 21 U.S.C. section 360bbb-3(b)(1), unless the authorization is terminated or revoked sooner. Performed at [facility], [place] [address], [place], [place] [address]      Labs: Basic Metabolic Panel: Recent Labs  Lab 11/01/18 1940 11/02/18 0358 11/03/18 0447  NA 133* 137 140  K 3.8 3.8 4.1  CL 101 104 106  CO2 19* 22 24  GLUCOSE 113* 107* 92  BUN 23 22 21   CREATININE  2.12* 1.77* 1.54*  CALCIUM 8.7* 8.6* 8.8*   Liver Function Tests: No  results for input(s): AST, ALT, ALKPHOS, BILITOT, PROT, ALBUMIN in the last 168 hours. No results for input(s): LIPASE, AMYLASE in the last 168 hours. No results for input(s): AMMONIA in the last 168 hours. CBC: Recent Labs  Lab 11/01/18 1940 11/02/18 0358 11/03/18 0447  WBC 8.7 8.5 6.3  HGB 12.3 11.9* 11.5*  HCT 37.7 35.6* 35.0*  MCV 93.1 91.3 91.4  PLT 201 200 192   Cardiac Enzymes: No results for input(s): CKTOTAL, CKMB, CKMBINDEX, TROPONINI in the last 168 hours. BNP: BNP (last 3 results) Recent Labs    02/21/18 0610 11/01/18 2313  BNP 45.6 244.8*    ProBNP (last 3 results) Recent Labs    01/27/18 1516  PROBNP 719    CBG: Recent Labs  Lab 11/02/18 1126 11/02/18 1642 11/02/18 2100 11/03/18 0635 11/03/18 1203  GLUCAP 95 109* 97 93 120*       Signed:  Fayrene Helper MD.  [facility] 11/03/2018, 2:14 PM

## 2018-11-03 NOTE — TOC Transition Note (Signed)
Transition of Care [facility]) - CM/SW Discharge Note   Patient Details  Name: [name] MRN: [id number] Date of Birth: 1933-01-08  Transition of Care [facility] CM/SW Contact:  Zenon Mayo, RN Phone Number: 11/03/2018, 2:50 PM   Clinical Narrative:     Patient is for discharge , NCM set patient up with outpatient rehab on [place] thru epic, see AVS.    Final next level of care: OP Rehab Barriers to Discharge: No Barriers Identified   Patient Goals and CMS Choice Patient states their goals for this hospitalization and ongoing recovery are:: to get better CMS Medicare.gov Compare Post Acute Care list provided to:: Patient Choice offered to / list presented to : Patient  Discharge Placement                       Discharge Plan and Services                DME Arranged: (NA)         HH Arranged: NA          Social Determinants of Health (SDOH) Interventions     Readmission Risk Interventions No flowsheet data found.

## 2018-11-10 ENCOUNTER — Encounter: Payer: Self-pay | Admitting: Cardiology

## 2018-11-10 ENCOUNTER — Other Ambulatory Visit: Payer: Self-pay

## 2018-11-10 ENCOUNTER — Ambulatory Visit (INDEPENDENT_AMBULATORY_CARE_PROVIDER_SITE_OTHER): Payer: Medicare HMO | Admitting: Cardiology

## 2018-11-10 ENCOUNTER — Encounter: Payer: Self-pay | Admitting: *Deleted

## 2018-11-10 VITALS — BP 172/86 | HR 85 | Ht 62.0 in | Wt 150.8 lb

## 2018-11-10 DIAGNOSIS — E785 Hyperlipidemia, unspecified: Secondary | ICD-10-CM

## 2018-11-10 DIAGNOSIS — I5032 Chronic diastolic (congestive) heart failure: Secondary | ICD-10-CM

## 2018-11-10 DIAGNOSIS — R0602 Shortness of breath: Secondary | ICD-10-CM

## 2018-11-10 DIAGNOSIS — R0789 Other chest pain: Secondary | ICD-10-CM

## 2018-11-10 DIAGNOSIS — E1169 Type 2 diabetes mellitus with other specified complication: Secondary | ICD-10-CM

## 2018-11-10 DIAGNOSIS — I5033 Acute on chronic diastolic (congestive) heart failure: Secondary | ICD-10-CM

## 2018-11-10 DIAGNOSIS — I10 Essential (primary) hypertension: Secondary | ICD-10-CM

## 2018-11-10 MED ORDER — FUROSEMIDE 40 MG PO TABS: 40.0000 mg | ORAL_TABLET | Freq: Every day | ORAL | 3 refills | Status: DC

## 2018-11-10 NOTE — Patient Instructions (Signed)
Medication Instructions:  Your physician has recommended you make the following change in your medication:  1.  INCREASE the Carvedilol to twice a day 2.  START Lasix 40 mg taking 1 tablet daily 3.  CHANGE the Imdur (Isorsobide) to taking in the morning  *If you need a refill on your cardiac medications before your next appointment, please call your pharmacy*  Lab Work: TODAY:  BMET & PRO BNP  If you have labs (blood work) drawn today and your tests are completely normal, you will receive your results only by: Marland Kitchen MyChart Message (if you have MyChart) OR . A paper copy in the mail If you have any lab test that is abnormal or we need to change your treatment, we will call you to review the results.  Testing/Procedures: Your physician has requested that you have a lexiscan myoview. For further information please visit [email or web address]. Please follow instruction sheet, as given.    Follow-Up: At [facility], you and your health needs are our priority.  As part of our continuing mission to provide you with exceptional heart care, we have created designated Provider Care Teams.  These Care Teams include your primary Cardiologist (physician) and Advanced Practice Providers (APPs -  Physician Assistants and Nurse Practitioners) who all work together to provide you with the care you need, when you need it.  Your next appointment:   1 WEEK   11/17/2018 BE HERE AT 11:30 FOR REGISTRATION  The format for your next appointment:   In Person  Provider:   Truitt Merle, NP  Other Instructions

## 2018-11-10 NOTE — Progress Notes (Signed)
Cardiology Office Note   Date:  11/10/2018   ID:  [name], DOB [birth year], MRN [id number]  PCP:  Dierdre Harness, FNP  Cardiologist:  Dr. Tamala Julian    Chief Complaint  Patient presents with  . Hospitalization Follow-up      History of Present Illness: [name] is a 85 y.o. female who presents for chronic diastolic HF and post hospitalization.  Hx of gum [place] s/p radiation in [place] (1990[place]) HTN, HLD, DMT2--diet controlled,, CKD and chronic diastolic CHF who presents to clinic for evaluation of chest pain. Days complaint is dyspnea and cough for several months.  Has had cough, clear phlegm production, and 3 pillow orthopnea since her stroke.  No lower extremity swelling is been noted.  Mild dyspnea on exertion is been noted but without chest discomfort.  She has not had hemoptysis. Prior stress 05/2017 with small area of inf basal ischemia - low risk.   Recent hospitalization 11/01/18 for hypoxia at 83% RA Echo EF 60-65%, LA moderately dilated, AR mild. Was diuresed. HS troponin 22, 29, 27  BNP 244 Elevated ddimer 2.32  Normal VQ lipids stable AKI with Cr at 2.12 and at discharge 1.77, 1.55 HTN was present and coreg increased, imdur started.  ACE stopped for AKI Thought COPD may have played a role.  Neg DVT Hx of heartburn, reflux  Today since discharge her SOB has increased, wakes her from sleep.  No lower ext edema but abd is more full.  She has trouble doing daily activities at home.   Appetite is poor.  She is able to do ADLs but has to stop freq.  Some chest pain as well.  Reviewed her meds and she was not taking them as ordered.  We reviewed. Discussed pt in pts' presence with her granddaughter  BP is elevated  Past Medical History:  Diagnosis Date  . Anemia 04/03/2011  . Anginal pain ([place])   . Anxiety   . Arthritis    "feet" (06/02/2017)  . Blind left eye 1999  . Depression   . Exertional dyspnea   . GERD (gastroesophageal reflux disease)   . High  cholesterol   . Hip fracture ([place]) 07/17/11   fall from 1-2 feet; left  . Hypertension   . Ischemic stroke ([place]) 06/02/2017   "weak moving around" (06/02/2017)  . Prosthetic eye globe 1999   "from diabetic; left eye"  . Tongue cancer ([place]) 1998   S/P radiation  . Type II diabetes mellitus ([place])    not taking any medication (06/02/2017)    Past Surgical History:  Procedure Laterality Date  . ENUCLEATION Left   . FRACTURE SURGERY    . HIP ARTHROPLASTY  07/18/2011   Procedure: ARTHROPLASTY BIPOLAR HIP;  Surgeon: Nita Sells, MD;  Location: [facility];  Service: Orthopedics;  Laterality: Left;  . INTRAOCULAR PROSTHESES INSERTION  1999   left  . TONGUE BIOPSY  1998   "cancer"  . TUBAL LIGATION  1970's     Current Outpatient Medications  Medication Sig Dispense Refill  . acetaminophen (TYLENOL) 325 MG tablet Take 2 tablets (650 mg total) by mouth every 6 (six) hours as needed for mild pain or fever (or temp > 37.5 C (99.5 F)).    . ALPHAGAN P 0.1 % SOLN Place 1 drop into the right eye 2 (two) times daily.    Marland Kitchen amLODipine (NORVASC) 10 MG tablet Take 10 mg by mouth daily.    Marland Kitchen aspirin 81 MG tablet Take 81  mg by mouth daily.    Marland Kitchen atorvastatin (LIPITOR) 20 MG tablet take 1 tablet by mouth once daily (CHANGING FROM SIMVASTATIN) 90 tablet 0  . carvedilol (COREG) 25 MG tablet Take 1 tablet (25 mg total) by mouth 2 (two) times daily with a meal. Take one daily if she does not feel better she will take another at about 2pm 60 tablet 0  . folic acid (FOLVITE) 1 MG tablet Take 1 tablet (1 mg total) by mouth daily.    Marland Kitchen gabapentin (NEURONTIN) 100 MG capsule Take 100 mg by mouth 3 (three) times daily.    . isosorbide mononitrate (IMDUR) 30 MG 24 hr tablet Take 1 tablet (30 mg total) by mouth daily. 30 tablet 0  . loratadine (CLARITIN) 10 MG tablet Take 10 mg by mouth daily.    . Multiple Vitamin (MULTIVITAMIN WITH MINERALS) TABS tablet Take 1 tablet by mouth daily.    Marland Kitchen omeprazole (PRILOSEC)  20 MG capsule Take 1 capsule (20 mg total) by mouth daily. 30 capsule 9  . TRAVATAN Z 0.004 % SOLN ophthalmic solution Place 1 drop into the right eye at bedtime.      No current facility-administered medications for this visit.     Allergies:   Patient has no known allergies.    Social History:  The patient  reports that she quit smoking about 21 years ago. Her smoking use included cigarettes. She has a 70.50 pack-year smoking history. She has never used smokeless tobacco. She reports current alcohol use of about 17.0 standard drinks of alcohol per week. She reports that she does not use drugs.   Family History:  The patient's family history includes Diabetes type II in her father, mother, and sister.    ROS:  General:no colds or fevers, + weight increase Skin:no rashes or ulcers HEENT:no blurred vision, no congestion CV:see HPI PUL:see HPI GI:no diarrhea constipation or melena, no indigestion GU:no hematuria, no dysuria MS:no joint pain, no claudication Neuro:no syncope, no lightheadedness Endo:no diabetes, no thyroid disease  Wt Readings from Last 3 Encounters:  11/10/18 150 lb 12.8 oz (68.4 kg)  11/03/18 145 lb 1.6 oz (65.8 kg)  02/23/18 144 lb 6.4 oz (65.5 kg)     PHYSICAL EXAM: VS:  BP (!) 172/86 Comment: right arm  Pulse 85   Ht 5\' 2"  (1.575 m)   Wt 150 lb 12.8 oz (68.4 kg)   SpO2 97%   BMI 27.58 kg/m  , BMI Body mass index is 27.58 kg/m. General:Pleasant affect, NAD Skin:Warm and dry, brisk capillary refill HEENT:normocephalic, sclera clear, mucus membranes moist Neck:supple, + JVD, no bruits  Heart:S1S2 RRR without murmur, gallup, rub or click Lungs:clear with scattered rales, no rhonchi, or wheezes OVF:IEPP, non tender, + BS, do not palpate liver spleen or masses Ext:no lower ext edema, 2+ pedal pulses, 2+ radial pulses Neuro:alert and oriented,X 3 MAE, follows commands, + facial symmetry  With ambulation pulse up to 95 and sp02 95%  EKG:  EKG is not  ordered today.    Recent Labs: 01/27/2018: NT-Pro BNP 719 02/21/2018: ALT 26 11/01/2018: B Natriuretic Peptide 244.8 11/03/2018: BUN 21; Creatinine, Ser 1.54; Hemoglobin 11.5; Platelets 192; Potassium 4.1; Sodium 140    Lipid Panel    Component Value Date/Time   CHOL 141 11/02/2018 0358   TRIG 148 11/02/2018 0358   HDL 40 (L) 11/02/2018 0358   CHOLHDL 3.5 11/02/2018 0358   VLDL 30 11/02/2018 0358   LDLCALC 71 11/02/2018 0358  Other studies Reviewed: Additional studies/ records that were reviewed today include: . Echo 11/02/18  IMPRESSIONS    1. Left ventricular ejection fraction, by visual estimation, is 60 to 65%. The left ventricle has normal function. Normal left ventricular size. Left ventricular septal wall thickness was mildly increased. Mildly increased left ventricular posterior  wall thickness.  2. Left ventricular diastolic Doppler parameters are consistent with impaired relaxation pattern of LV diastolic filling.  3. Elevated left ventricular end-diastolic pressure.  4. Global right ventricle has normal systolic function.The right ventricular size is normal. No increase in right ventricular wall thickness.  5. Left atrial size was moderately dilated.  6. Right atrial size was normal.  7. Moderate mitral annular calcification.  8. The mitral valve is normal in structure. Trace mitral valve regurgitation. No evidence of mitral stenosis.  9. The tricuspid valve is normal in structure. Tricuspid valve regurgitation is mild. 10. The aortic valve is tricuspid. Aortic valve regurgitation is trivial by color flow Doppler. Moderate aortic valve sclerosis/calcification without any evidence of aortic stenosis. 11. The pulmonic valve was normal in structure. Pulmonic valve regurgitation is mild by color flow Doppler. 12. Normal pulmonary artery systolic pressure. 13. The inferior vena cava is normal in size with greater than 50% respiratory variability, suggesting  right atrial pressure of 3 mmHg.  FINDINGS  Left Ventricle: Left ventricular ejection fraction, by visual estimation, is 60 to 65%. The left ventricle has normal function. Left ventricular septal wall thickness was mildly increased. Mildly increased left ventricular posterior wall thickness.  There is no left ventricular hypertrophy. Normal left ventricular size. Spectral Doppler shows Left ventricular diastolic Doppler parameters are consistent with impaired relaxation pattern of LV diastolic filling. Elevated left ventricular end-diastolic  pressure.  Right Ventricle: The right ventricular size is normal. No increase in right ventricular wall thickness. Global RV systolic function is has normal systolic function. The tricuspid regurgitant velocity is 2.55 m/s, and with an assumed right atrial pressure  of 3 mmHg, the estimated right ventricular systolic pressure is normal at 29.0 mmHg.  Left Atrium: Left atrial size was moderately dilated.  Right Atrium: Right atrial size was normal in size  Pericardium: There is no evidence of pericardial effusion.  Mitral Valve: The mitral valve is normal in structure. Moderate mitral annular calcification. No evidence of mitral valve stenosis by observation. Trace mitral valve regurgitation.  Tricuspid Valve: The tricuspid valve is normal in structure. Tricuspid valve regurgitation is mild by color flow Doppler.  Aortic Valve: The aortic valve is tricuspid. Aortic valve regurgitation is trivial by color flow Doppler. Aortic regurgitation PHT measures 652 msec. Moderate aortic valve sclerosis/calcification is present, without any evidence of aortic stenosis.  Pulmonic Valve: The pulmonic valve was normal in structure. Pulmonic valve regurgitation is mild by color flow Doppler.  Aorta: The aortic root, ascending aorta and aortic arch are all structurally normal, with no evidence of dilitation or obstruction.  Venous: The inferior vena cava is  normal in size with greater than 50% respiratory variability, suggesting right atrial pressure of 3 mmHg.  IAS/Shunts: No atrial level shunt detected by color flow Doppler. No ventricular septal defect is seen or detected. There is no evidence of an atrial septal defect.   ASSESSMENT AND PLAN:  1.  Acute on chronic diastolic HF with SOB  will add lasix 40 mg daily and increase her coreg to BID, she will take her imdur in the AM.  Will check pro BNP, BMP , have her seen in 1  week for follow up  2  Uncontrolled HTN adding lasix and having her take coreg twice a day instead on once may need more meds - follow up next week  3.  Chest pain still with episodes with check lexiscan myoview   4.  HLD on lipitor, LDL 71 and HDL 40   5.   CKD-3 was elevated in hospital will check today.    Current medicines are reviewed with the patient today.  The patient Has no concerns regarding medicines.  The following changes have been made:  See above Labs/ tests ordered today include:see above  Disposition:   FU:  see above  Signed, Cecilie Kicks, NP  11/10/2018 2:33 PM    [facility] [place], [place], [place], [place] Phone: [phone]; Fax: [phone]

## 2018-11-11 LAB — BASIC METABOLIC PANEL
BUN/Creatinine Ratio: 15 (ref 12–28)
BUN: 22 mg/dL (ref 8–27)
CO2: 21 mmol/L (ref 20–29)
Calcium: 9.6 mg/dL (ref 8.7–10.3)
Chloride: 102 mmol/L (ref 96–106)
Creatinine, Ser: 1.47 mg/dL — ABNORMAL HIGH (ref 0.57–1.00)
GFR calc Af Amer: 38 mL/min/{1.73_m2} — ABNORMAL LOW (ref >59)
GFR calc non Af Amer: 33 mL/min/{1.73_m2} — ABNORMAL LOW (ref >59)
Glucose: 104 mg/dL — ABNORMAL HIGH (ref 65–99)
Potassium: 4.5 mmol/L (ref 3.5–5.2)
Sodium: 138 mmol/L (ref 134–144)

## 2018-11-11 LAB — PRO B NATRIURETIC PEPTIDE: NT-Pro BNP: 924 pg/mL — ABNORMAL HIGH (ref 0–738)

## 2018-11-11 NOTE — Progress Notes (Deleted)
CARDIOLOGY OFFICE NOTE  Date:  11/11/2018    [name] Date of Birth: 11-May-1932 Medical Record #[id number]  PCP:  Dierdre Harness, FNP  Cardiologist:  Servando Snare & ***    No chief complaint on file.   History of Present Illness: [name] is a 85 y.o. female who presents today for a *** presents for chronic diastolic HF and post hospitalization.  Hx of [place] (1990[place]) HTN, HLD, DMT2--diet controlled,, CKD and chronic diastolic CHF who presents to clinic for evaluation of chest pain.Days complaint is dyspnea and cough for several months.  Has had cough, clear phlegm production, and 3 pillow orthopnea since her stroke. No lower extremity swelling is been noted. Mild dyspnea on exertion is been noted but without chest discomfort. She has not had hemoptysis. Prior stress 05/2017 with small area of inf basal ischemia - low risk.   Recent hospitalization 11/01/18 for hypoxia at 83% RA Echo EF 60-65%, LA moderately dilated, AR mild. Was diuresed. HS troponin 22, 29, 27  BNP 244 Elevated ddimer 2.32  Normal VQ lipids stable AKI with Cr at 2.12 and at discharge 1.77, 1.55 HTN was present and coreg increased, imdur started.  ACE stopped for AKI Thought COPD may have played a role.  Neg DVT Hx of heartburn, reflux  Today since discharge her SOB has increased, wakes her from sleep.  No lower ext edema but abd is more full.  She has trouble doing daily activities at home.   Appetite is poor.  She is able to do ADLs but has to stop freq.    The patient {does/does not:200015} have symptoms concerning for COVID-19 infection (fever, chills, cough, or new shortness of breath).   Comes in today. Here with   Past Medical History:  Diagnosis Date  . Anemia 04/03/2011  . Anginal pain ([place])   . Anxiety   . Arthritis    "feet" (06/02/2017)  . Blind left eye 1999  . Depression   . Exertional dyspnea   . GERD (gastroesophageal reflux  disease)   . High cholesterol   . Hip fracture ([place]) 07/17/11   fall from 1-2 feet; left  . Hypertension   . Ischemic stroke ([place]) 06/02/2017   "weak moving around" (06/02/2017)  . Prosthetic eye globe 1999   "from diabetic; left eye"  . Tongue cancer ([place]) 1998   S/P radiation  . Type II diabetes mellitus ([place])    not taking any medication (06/02/2017)    Past Surgical History:  Procedure Laterality Date  . ENUCLEATION Left   . FRACTURE SURGERY    . HIP ARTHROPLASTY  07/18/2011   Procedure: ARTHROPLASTY BIPOLAR HIP;  Surgeon: Nita Sells, MD;  Location: [facility];  Service: Orthopedics;  Laterality: Left;  . INTRAOCULAR PROSTHESES INSERTION  1999   left  . TONGUE BIOPSY  1998   "cancer"  . TUBAL LIGATION  1970's     Medications: No outpatient medications have been marked as taking for the 11/17/18 encounter (Appointment) with Burtis Junes, NP.     Allergies: No Known Allergies  Social History: The patient  reports that she quit smoking about 21 years ago. Her smoking use included cigarettes. She has a 70.50 pack-year smoking history. She has never used smokeless tobacco. She reports current alcohol use of about 17.0 standard drinks of alcohol per week. She reports that she does not use drugs.   Family History: The patient's ***family history includes Diabetes type  II in her father, mother, and sister.   Review of Systems: Please see the history of present illness.   All other systems are reviewed and negative.   Physical Exam: VS:  There were no vitals taken for this visit. Marland Kitchen  BMI There is no height or weight on file to calculate BMI.  Wt Readings from Last 3 Encounters:  11/10/18 150 lb 12.8 oz (68.4 kg)  11/03/18 145 lb 1.6 oz (65.8 kg)  02/23/18 144 lb 6.4 oz (65.5 kg)    General: Pleasant. Well developed, well nourished and in no acute distress.   HEENT: Normal.  Neck: Supple, no JVD, carotid bruits, or masses noted.  Cardiac: ***Regular rate and  rhythm. No murmurs, rubs, or gallops. No edema.  Respiratory:  Lungs are clear to auscultation bilaterally with normal work of breathing.  GI: Soft and nontender.  MS: No deformity or atrophy. Gait and ROM intact.  Skin: Warm and dry. Color is normal.  Neuro:  Strength and sensation are intact and no gross focal deficits noted.  Psych: Alert, appropriate and with normal affect.   LABORATORY DATA:  EKG:  EKG {ACTION; IS/IS XKG:81856314} ordered today. This demonstrates ***.  Lab Results  Component Value Date   WBC 6.3 11/03/2018   HGB 11.5 (L) 11/03/2018   HCT 35.0 (L) 11/03/2018   PLT 192 11/03/2018   GLUCOSE 104 (H) 11/10/2018   CHOL 141 11/02/2018   TRIG 148 11/02/2018   HDL 40 (L) 11/02/2018   LDLCALC 71 11/02/2018   ALT 26 02/21/2018   AST 44 (H) 02/21/2018   NA 138 11/10/2018   K 4.5 11/10/2018   CL 102 11/10/2018   CREATININE 1.47 (H) 11/10/2018   BUN 22 11/10/2018   CO2 21 11/10/2018   TSH 2.40 06/10/2017   INR 0.91 06/02/2017   HGBA1C 5.7 (H) 06/03/2017   MICROALBUR 2.63 (H) 02/28/2011     BNP (last 3 results) Recent Labs    02/21/18 0610 11/01/18 2313  BNP 45.6 244.8*    ProBNP (last 3 results) Recent Labs    01/27/18 1516 11/10/18 1516  PROBNP 719 924*     Other Studies Reviewed Today:  ECHO IMPRESSIONS 10/2018   1. Left ventricular ejection fraction, by visual estimation, is 60 to 65%. The left ventricle has normal function. Normal left ventricular size. Left ventricular septal wall thickness was mildly increased. Mildly increased left ventricular posterior  wall thickness.  2. Left ventricular diastolic Doppler parameters are consistent with impaired relaxation pattern of LV diastolic filling.  3. Elevated left ventricular end-diastolic pressure.  4. Global right ventricle has normal systolic function.The right ventricular size is normal. No increase in right ventricular wall thickness.  5. Left atrial size was moderately dilated.  6.  Right atrial size was normal.  7. Moderate mitral annular calcification.  8. The mitral valve is normal in structure. Trace mitral valve regurgitation. No evidence of mitral stenosis.  9. The tricuspid valve is normal in structure. Tricuspid valve regurgitation is mild. 10. The aortic valve is tricuspid. Aortic valve regurgitation is trivial by color flow Doppler. Moderate aortic valve sclerosis/calcification without any evidence of aortic stenosis. 11. The pulmonic valve was normal in structure. Pulmonic valve regurgitation is mild by color flow Doppler. 12. Normal pulmonary artery systolic pressure. 13. The inferior vena cava is normal in size with greater than 50% respiratory variability, suggesting right atrial pressure of 3 mmHg.   Assessment/Plan:  . COVID-19 Education: The signs and symptoms of COVID-19 were  discussed with the patient and how to seek care for testing (follow up with PCP or arrange E-visit).  The importance of social distancing, staying at home, hand hygiene and wearing a mask when out in public were discussed today.  Current medicines are reviewed with the patient today.  The patient does not have concerns regarding medicines other than what has been noted above.  The following changes have been made:  See above.  Labs/ tests ordered today include:   No orders of the defined types were placed in this encounter.    Disposition:   FU with *** in {gen number [id number]:189842} {Days to years:10300}.   Patient is agreeable to this plan and will call if any problems develop in the interim.   SignedTruitt Merle, NP  11/11/2018 4:11 PM  [facility] [address] [place], [place]  [address] Phone: [phone] Fax: [phone]

## 2018-11-15 ENCOUNTER — Ambulatory Visit: Payer: Medicare HMO | Admitting: Physical Therapy

## 2018-11-15 NOTE — Progress Notes (Addendum)
Cardiology Office Note:    Date:  11/17/2018   ID:  [name], DOB 14-Feb-1932, MRN [id number]  PCP:  Dierdre Harness, FNP  Cardiologist:  Sinclair Grooms, MD  Electrophysiologist:  None   Referring MD: Dierdre Harness, FNP   Chief Complaint: follow up of CHF and hypertension  History of Present Illness:    [name] is a 85 y.o. female with a history of chronic diastolic CHF, stroke, hypertension, hyperlipidemia, type 2 diabetes mellitus controlled by diet, CKD, and tongue cancer s/p radiation in [place] in 1990's who is followed by Dr. Tamala Julian and presents today for follow-up of hypertension.  Patient was admitted in 05/2017 for chest pain. Myoview at that time showed a small area of inferior basal ischemia but was considered low risk overall. Patient also reported ataxia at that time and MRI showed new strokes in the cerebellar and pons compared to imaging in 2011.   Patient was recently admitted from 11/01/2018 to 11/03/2018 for chest pain rule out after presenting with chest pain and dyspnea. EKG showed mild T wave inversions in inferior and lateral leads but these have been seen in the past. High-sensitivity troponin minimally elevated and flat at 22 >>29 >> 27. Not consistent with ACS. Chest pain felt to be atypical as patient described vague pain across all chest but predominately in both sides and described it as pleuritic. Patient noted to be hypoxic in the ED requiring 3-4 L of O2. Chest x-ray showed low lung volume but no acute findings. D-dimer was elevated but patient had negative V/Q scan and lower extremity dopplers. Chest CTA could not be performed due to AKI with creatinine of 2.12 on admission. Echo during admission showed LVEF of 60-65% with grade 1 diastolic dysfunction and elevated LVEDP. She was diuresed due to suspected volume overload. Patient also suspected to have COPD given long history of smoking and outpatient PFTs were recommended.  Patient was able to be  weaned off O2. Cardiology was not formally consulted but primary team did discuss patient with Dr. Meda Coffee who recommended adding Imdur and increasing Coreg with close outpatient follow-up. Home Lisinopril had to be discontinued due to AKI.  She was seen for follow-up on 11/10/2018 by Cecilie Kicks, NP, at which time she reported increased shortness of breath that waked her form sleep as well as abdominal fullness but no lower extremity edema. She reported having trouble doing daily activities due to frequently having to stop. She also noted some continued chest pain. BP was elevated at 172/86. Labs ordered at that visit showed stable creatinine and elevated BNP in the 900's. Patient was started on Lasix 40mg  daily and Coreg was increased to twice daily. Myoview was also ordered for further evaluation of chest pain.  Patient presents today for follow-up. Initial BP 160/84. However, on my check in the 140's/60's. Patient states she was not able to take her BP medications today because she had to take 2 buses to get here this morning. She states she is normally compliant with her medications though. Patient continues to report pain that starts in her jaw and then radiates down both sides of her neck to her chest. This occurs at rest and typically lasts for about 20 minutes. Patient does not feel like Imdur has helped. She denies any jaw and chest pain at this time. She notes continued dyspnea on exertion. She thinks it is slightly better than when she was seen last week. She thinks Lasix may have helped some;  however, she also reports that she has to use her inhaler every 20-30 minutes when exerting herself. She states her inhaler help a little. She has stable 2 pillow orthopnea which she states she has had for years. No PND or lower extremity edema. Her weight is stable from last week. No palpitations. She does reports some dizziness first thing in the mornings after getting up and with quick position changes.  Dizziness improves throughout the day and is not worse after she takes her BP medications. She denies any recent falls or syncope.   Past Medical History:  Diagnosis Date   Anemia 04/03/2011   Anginal pain ([place])    Anxiety    Arthritis    "feet" (06/02/2017)   Blind left eye 1999   Depression    Exertional dyspnea    GERD (gastroesophageal reflux disease)    High cholesterol    Hip fracture ([place]) 07/17/11   fall from 1-2 feet; left   Hypertension    Ischemic stroke ([place]) 06/02/2017   "weak moving around" (06/02/2017)   Prosthetic eye globe 1999   "from diabetic; left eye"   Tongue cancer ([place]) 1998   S/P radiation   Type II diabetes mellitus ([place])    not taking any medication (06/02/2017)    Past Surgical History:  Procedure Laterality Date   ENUCLEATION Left    FRACTURE SURGERY     HIP ARTHROPLASTY  07/18/2011   Procedure: ARTHROPLASTY BIPOLAR HIP;  Surgeon: Nita Sells, MD;  Location: [facility];  Service: Orthopedics;  Laterality: Left;   INTRAOCULAR PROSTHESES INSERTION  1999   left   TONGUE BIOPSY  1998   "cancer"   TUBAL LIGATION  1970's    Current Medications: Current Meds  Medication Sig   acetaminophen (TYLENOL) 325 MG tablet Take 2 tablets (650 mg total) by mouth every 6 (six) hours as needed for mild pain or fever (or temp > 37.5 C (99.5 F)).   ALPHAGAN P 0.1 % SOLN Place 1 drop into the right eye 2 (two) times daily.   amLODipine (NORVASC) 10 MG tablet Take 10 mg by mouth daily.   aspirin 81 MG tablet Take 81 mg by mouth daily.   atorvastatin (LIPITOR) 20 MG tablet take 1 tablet by mouth once daily (CHANGING FROM SIMVASTATIN)   carvedilol (COREG) 25 MG tablet Take 1 tablet (25 mg total) by mouth 2 (two) times daily with a meal. Take one daily if she does not feel better she will take another at about 2pm   folic acid (FOLVITE) 1 MG tablet Take 1 tablet (1 mg total) by mouth daily.   furosemide (LASIX) 40 MG tablet Take 1 tablet  (40 mg total) by mouth daily.   gabapentin (NEURONTIN) 100 MG capsule Take 100 mg by mouth 3 (three) times daily.   isosorbide mononitrate (IMDUR) 30 MG 24 hr tablet Take 1 tablet (30 mg total) by mouth daily.   loratadine (CLARITIN) 10 MG tablet Take 10 mg by mouth daily.   Multiple Vitamin (MULTIVITAMIN WITH MINERALS) TABS tablet Take 1 tablet by mouth daily.   omeprazole (PRILOSEC) 20 MG capsule Take 1 capsule (20 mg total) by mouth daily.   TRAVATAN Z 0.004 % SOLN ophthalmic solution Place 1 drop into the right eye at bedtime.      Allergies:   Patient has no known allergies.   Social History   Socioeconomic History   Marital status: Single    Spouse name: Not on file   Number  of children: Not on file   Years of education: Not on file   Highest education level: Not on file  Occupational History   Not on file  Social Needs   Financial resource strain: Not on file   Food insecurity    Worry: Not on file    Inability: Not on file   Transportation needs    Medical: Not on file    Non-medical: Not on file  Tobacco Use   Smoking status: Former Smoker    Packs/day: 1.50    Years: 47.00    Pack years: 70.50    Types: Cigarettes    Quit date: 09/13/1997    Years since quitting: 21.1   Smokeless tobacco: Never Used  Substance and Sexual Activity   Alcohol use: Yes    Alcohol/week: 17.0 standard drinks    Types: 17 Shots of liquor per week    Comment: 06/02/2017 "1/5 rum q weekend"   Drug use: No    Types: Marijuana, Heroin    Comment: 06/02/2017  "last drug use was in the 1980s"   Sexual activity: Not Currently  Lifestyle   Physical activity    Days per week: Not on file    Minutes per session: Not on file   Stress: Not on file  Relationships   Social connections    Talks on phone: Not on file    Gets together: Not on file    Attends religious service: Not on file    Active member of club or organization: Not on file    Attends meetings of clubs  or organizations: Not on file    Relationship status: Not on file  Other Topics Concern   Not on file  Social History Narrative   Not on file     Family History: The patient's family history includes Diabetes type II in her father, mother, and sister. There is no history of Colon cancer.  ROS:   Please see the history of present illness.    All other systems reviewed and are negative.  EKGs/Labs/Other Studies Reviewed:    The following studies were reviewed today:  Myoview 06/05/2017:  There was no ST segment deviation noted during stress.  Findings consistent with ischemia.  This is a low risk study.  The left ventricular ejection fraction is mildly decreased (45-54%).   Small area of inferior basal ischemia SDS 7 EF normal 54% _______________  Echocardiogram 11/02/2018: Impressions: 1. Left ventricular ejection fraction, by visual estimation, is 60 to 65%. The left ventricle has normal function. Normal left ventricular size. Left ventricular septal wall thickness was mildly increased. Mildly increased left ventricular posterior  wall thickness.  2. Left ventricular diastolic Doppler parameters are consistent with impaired relaxation pattern of LV diastolic filling.  3. Elevated left ventricular end-diastolic pressure.  4. Global right ventricle has normal systolic function.The right ventricular size is normal. No increase in right ventricular wall thickness.  5. Left atrial size was moderately dilated.  6. Right atrial size was normal.  7. Moderate mitral annular calcification.  8. The mitral valve is normal in structure. Trace mitral valve regurgitation. No evidence of mitral stenosis.  9. The tricuspid valve is normal in structure. Tricuspid valve regurgitation is mild. 10. The aortic valve is tricuspid. Aortic valve regurgitation is trivial by color flow Doppler. Moderate aortic valve sclerosis/calcification without any evidence of aortic stenosis. 11. The pulmonic  valve was normal in structure. Pulmonic valve regurgitation is mild by color flow Doppler. 12. Normal pulmonary  artery systolic pressure. 13. The inferior vena cava is normal in size with greater than 50% respiratory variability, suggesting right atrial pressure of 3 mmHg.  EKG:  EKG not ordered today.   Recent Labs: 02/21/2018: ALT 26 11/01/2018: B Natriuretic Peptide 244.8 11/03/2018: Hemoglobin 11.5; Platelets 192 11/10/2018: BUN 22; Creatinine, Ser 1.47; NT-Pro BNP 924; Potassium 4.5; Sodium 138  Recent Lipid Panel    Component Value Date/Time   CHOL 141 11/02/2018 0358   TRIG 148 11/02/2018 0358   HDL 40 (L) 11/02/2018 0358   CHOLHDL 3.5 11/02/2018 0358   VLDL 30 11/02/2018 0358   LDLCALC 71 11/02/2018 0358    Physical Exam:    Vital Signs: BP (!) 160/84 (BP Location: Left Arm, Patient Position: Sitting, Cuff Size: Normal)    Pulse 69    Ht 5' (1.524 m)    Wt 150 lb 12.8 oz (68.4 kg)    SpO2 99%    BMI 29.45 kg/m     Wt Readings from Last 3 Encounters:  11/17/18 150 lb 12.8 oz (68.4 kg)  11/10/18 150 lb 12.8 oz (68.4 kg)  11/03/18 145 lb 1.6 oz (65.8 kg)     General: 85 y.o. African-American female resting comfortably in no acute distress. HEENT: Normocephalic and atraumatic. Sclera clear. EOMs intact. Neck: Supple. No carotid bruits. No JVD. Heart: RRR. Distinct S1 and S2. II-III systolic murmur best heard at right upper sternal border. No gallops or rubs. Radial pulses 2+ and equal bilaterally. Lungs: No increased work of breathing. Mild course crackles noted in bases but lungs otherwise clear. No wheezes or rhonchi. Abdomen: Soft, non-distended, and non-tender to palpation. Bowel sounds present. MSK: Normal strength and tone for age. Extremities: No lower extremity edema.    Skin: Warm and dry. Neuro: Alert and oriented x3. No focal deficits. Psych: Normal affect. Responds appropriately.  Assessment:    1. Chronic diastolic CHF (congestive heart failure) (HCC)     2. Chest pain of uncertain etiology   3. Heart murmur   4. Essential hypertension   5. Hyperlipidemia, unspecified hyperlipidemia type   6. Stage 3 chronic kidney disease, unspecified whether stage 3a or 3b CKD     Plan:    Chronic Diastolic CHF - Patient continues to note some dyspnea on exertion but patient states this is slightly improved from last visit. Suspect some of this is due to deconditioning. - Recent Echo from 11/02/2018 showed LVEF of 60-65% with grade 1 diastolic dysfunction and elevated LVEDP. - BNP at visit last week was elevated in the 900's. Patient was started on Lasix 40mg  daily. - Patient has mild course crackles noted in bilateral bases but otherwise does not appear volume overloaded. Weight stable from last week. Patient already has some dizziness with position changes so would be careful with advancing diuresis. Continue current dose of IV Lasix. - Will recheck BMET today. - Also recommended following up with PCP for further evaluation of possible COPD given smoking history and increased used of inhaler.  Chest Pain - Patient continues to have jaw pain that radiates to chest at rest. Currently pain free.  - Myoview has been scheduled for 11/24/2018. - Continue medical therapy: Aspirin 81mg  daily, Lipitor 20mg  daily, Coreg 25mg  twice daily, Imdur 30mg  daily.   Murmur - Systolic murmur heard throughout but loudest at upper sternal border on exam. - Recent Echo showed moderate aortic valve sclerosis/calcification but no evidence of aortic stenosis. Trace mitral regurgitation, mild tricuspid regurgitation, and mild pulmonic regurgitation also noted.  Hypertension - Initial BP 160/84. I personally rechecked this on both arms and BP was in the 140's/60's and this was without patient taking morning medications. Patient's granddaughter comes and measures BP every other day. She states systolic BP often in the 423'N. - Continue Amlodipine 10mg  daily and Coreg 25mg  twice  daily. - Have asked patient to keep log of BP and can review at follow-up visit after Myoview. If BP still elevated, may try Hydralazine 25mg  twice daily. Patient reports dizziness with position changes. Given her age, I would be careful up-titrating BP medications.   Hyperlipidemia - Lipid panel from 10/2018: Total Cholesterol 141, Triglycerides 148, HDL 40, LDL 71.  - Continue Lipitor 20mg  daily.  CKD Stage III - Creatinine stable at 1.47 on 11/10/2018. - Will recheck BMET today after initiation of Lasix at last visit.   Social Concern - Patient had to take 2 buses and walk in the cold with inhaler in one hand and cane in another in order come to clinic today. She lives less than 5 miles away from the clinic. We will use [facility] funds to pay for her to take [place] home. Will also reach out to Raquel Sarna to see if there is anyway we can help her with transportation going forward and to see if there are any other social needs that we could help with.  Disposition: Follow up after Myoview. This can be a virtual visit.   Medication Adjustments/Labs and Tests Ordered: Current medicines are reviewed at length with the patient today.  Concerns regarding medicines are outlined above.  Orders Placed This Encounter  Procedures   Basic Metabolic Panel (BMET)   No orders of the defined types were placed in this encounter.   Patient Instructions  Medication Instructions:  Your physician recommends that you continue on your current medications as directed. Please refer to the Current Medication list given to you today.  *If you need a refill on your cardiac medications before your next appointment, please call your pharmacy*  Lab Work: Your physician recommends that you have lab work today: Bmet  If you have labs (blood work) drawn today and your tests are completely normal, you will receive your results only by:  [employer] (if you have MyChart) OR  A paper copy  in the mail If you have any lab test that is abnormal or we need to change your treatment, we will call you to review the results.  Testing/Procedures: Keep your upcoming appointment for you stress test results.   Follow-Up: At [facility], you and your health needs are our priority.  As part of our continuing mission to provide you with exceptional heart care, we have created designated Provider Care Teams.  These Care Teams include your primary Cardiologist (physician) and Advanced Practice Providers (APPs -  Physician Assistants and Nurse Practitioners) who all work together to provide you with the care you need, when you need it.  Your next appointment:   Your physician recommends that you keep your scheduled  follow-up appointment on the phone with Cecilie Kicks, NP.    Other Instructions   1.  Keep a list of your bp and go over bp readings on your phone visit.  2.  Follow up with your PCP as instructed on your discharge summary.       Signed, Darreld Mclean, PA-C  11/17/2018 1:25 PM    [facility]

## 2018-11-17 ENCOUNTER — Other Ambulatory Visit: Payer: Self-pay

## 2018-11-17 ENCOUNTER — Ambulatory Visit (INDEPENDENT_AMBULATORY_CARE_PROVIDER_SITE_OTHER): Payer: Medicare HMO | Admitting: Student

## 2018-11-17 ENCOUNTER — Encounter: Payer: Self-pay | Admitting: Student

## 2018-11-17 VITALS — BP 160/84 | HR 69 | Ht 60.0 in | Wt 150.8 lb

## 2018-11-17 DIAGNOSIS — R011 Cardiac murmur, unspecified: Secondary | ICD-10-CM | POA: Diagnosis not present

## 2018-11-17 DIAGNOSIS — I10 Essential (primary) hypertension: Secondary | ICD-10-CM

## 2018-11-17 DIAGNOSIS — E785 Hyperlipidemia, unspecified: Secondary | ICD-10-CM

## 2018-11-17 DIAGNOSIS — R079 Chest pain, unspecified: Secondary | ICD-10-CM | POA: Diagnosis not present

## 2018-11-17 DIAGNOSIS — I5032 Chronic diastolic (congestive) heart failure: Secondary | ICD-10-CM

## 2018-11-17 DIAGNOSIS — N183 Chronic kidney disease, stage 3 unspecified: Secondary | ICD-10-CM

## 2018-11-17 LAB — BASIC METABOLIC PANEL
BUN/Creatinine Ratio: 14 (ref 12–28)
BUN: 37 mg/dL — ABNORMAL HIGH (ref 8–27)
CO2: 22 mmol/L (ref 20–29)
Calcium: 9.7 mg/dL (ref 8.7–10.3)
Chloride: 95 mmol/L — ABNORMAL LOW (ref 96–106)
Creatinine, Ser: 2.74 mg/dL — ABNORMAL HIGH (ref 0.57–1.00)
GFR calc Af Amer: 18 mL/min/{1.73_m2} — ABNORMAL LOW (ref >59)
GFR calc non Af Amer: 15 mL/min/{1.73_m2} — ABNORMAL LOW (ref >59)
Glucose: 154 mg/dL — ABNORMAL HIGH (ref 65–99)
Potassium: 4.5 mmol/L (ref 3.5–5.2)
Sodium: 132 mmol/L — ABNORMAL LOW (ref 134–144)

## 2018-11-17 NOTE — Patient Instructions (Signed)
Medication Instructions:  Your physician recommends that you continue on your current medications as directed. Please refer to the Current Medication list given to you today.  *If you need a refill on your cardiac medications before your next appointment, please call your pharmacy*  Lab Work: Your physician recommends that you have lab work today: Bmet  If you have labs (blood work) drawn today and your tests are completely normal, you will receive your results only by: [facility] MyChart Message (if you have MyChart) OR . A paper copy in the mail If you have any lab test that is abnormal or we need to change your treatment, we will call you to review the results.  Testing/Procedures: Keep your upcoming appointment for you stress test results.   Follow-Up: At [facility], you and your health needs are our priority.  As part of our continuing mission to provide you with exceptional heart care, we have created designated Provider Care Teams.  These Care Teams include your primary Cardiologist (physician) and Advanced Practice Providers (APPs -  Physician Assistants and Nurse Practitioners) who all work together to provide you with the care you need, when you need it.  Your next appointment:   Your physician recommends that you keep your scheduled  follow-up appointment on the phone with Cecilie Kicks, NP.    Other Instructions   1.  Keep a list of your bp and go over bp readings on your phone visit.  2.  Follow up with your PCP as instructed on your discharge summary.

## 2018-11-18 ENCOUNTER — Telehealth: Payer: Self-pay | Admitting: Student

## 2018-11-18 ENCOUNTER — Telehealth: Payer: Self-pay | Admitting: Licensed Clinical Social Worker

## 2018-11-18 MED ORDER — ISOSORBIDE MONONITRATE ER 60 MG PO TB24: 60.0000 mg | ORAL_TABLET | Freq: Every day | ORAL | 3 refills | Status: DC

## 2018-11-18 NOTE — Telephone Encounter (Signed)
CSW received referral to assist with transportation. CSW contacted patient and left message for return call. Raquel Sarna, [place], [place]

## 2018-11-18 NOTE — Telephone Encounter (Signed)
Please notify patient of BMET results from yesterday:  Creatinine (measure of kidney function) significantly up from labs last week. I discussed patient with Dr. Tamala Julian and plan is: - Stop Lasix given dyspnea did not improve much with this. - Increase Imdur to 60mg  daily. - Patient will need in-office follow-up visit in 7-10 days. Per Dr. Tamala Julian, can recheck BMET at that time.  Patient likely has component of CAD but would like to treat medically at this time as any work-up with coronary CT or cath could worsen renal function. Dr. Tamala Julian recommends cancelling Myoview for next week. At follow-up, visit we may need to continue to up-titrate Imdur. When renal function has improved, Dr. Tamala Julian also recommends adding Spironolactone 12.5-25mg  daily.  If patient has any worsening chest pain or shortness of breath before visit next week, she needs to go to the ED for further evaluation.  Thank you!

## 2018-11-18 NOTE — Telephone Encounter (Signed)
Called and discussed below recommendations. Patient voiced understanding and agreed.   Patient aware of appointment scheduled for next Friday 11/26/2018 at 11:15am with [name]. Will try to see if we can help arrange transportation for this.

## 2018-11-18 NOTE — Telephone Encounter (Signed)
Call placed to pt re: phone note below. Left a message for pt to call back.

## 2018-11-22 ENCOUNTER — Ambulatory Visit: Payer: Medicare HMO | Attending: Family Medicine | Admitting: Physical Therapy

## 2018-11-23 ENCOUNTER — Telehealth: Payer: Self-pay | Admitting: Licensed Clinical Social Worker

## 2018-11-23 NOTE — Telephone Encounter (Signed)
CSW referred to assist patient with transportation needs. CSW spoke with patient who has an appointment on Friday and will need transport. She reports she ambulates with a cane but is able to get in and out of a car independently. CSW will make arrangements through the [employer]. Patient grateful for the assistance. CSW will follow up with patient with pick up time. Raquel Sarna, [place], [place]

## 2018-11-24 ENCOUNTER — Telehealth: Payer: Self-pay | Admitting: Licensed Clinical Social Worker

## 2018-11-24 ENCOUNTER — Encounter (HOSPITAL_COMMUNITY): Payer: Medicare HMO

## 2018-11-24 NOTE — Telephone Encounter (Signed)
CSW contacted patient to confirm transport will pick her up at 10:35 pm on Friday and she will be contacted via phone when they are on the way. Patient verbalizes understanding.[name], [place], [place]

## 2018-11-25 NOTE — Progress Notes (Signed)
Cardiology Office Note:    Date:  11/26/2018   ID:  [name], DOB 03/29/1932, MRN [id number]  PCP:  Dierdre Harness, FNP  Cardiologist:  Sinclair Grooms, MD   Electrophysiologist:  None   Referring MD: Dierdre Harness, FNP   Chief Complaint  Patient presents with  . Shortness of Breath  . Chest Pain     History of Present Illness:    [name] is a 85 y.o. female with:   Chronic Diastolic CHF  Chest pain   Myoview 5/19: small inf basal ischemia, low risk >> Med Rx.  Avoiding cath due to chronic kidney disease   admx 10/2018, diuresed for possible CHF; med Rx; antianginals adjusted   Hx of CVA  Hypertension   Hyperlipidemia   Diabetes mellitus 2 (diet)  Chronic kidney disease   Tongue CA s/p radiation in [place] in 1990s  Tobacco use  GERD   [name] was admitted in 10/2018 with chest pain and hypoxia.  EF was normal on Echocardiogram.  A DDimer was elevated and VQ scan was low probability and venous US was neg for DVT.    Hs-Troponin was mildly elevated without clear trend.  She was started on Lasix to cover for possible volume overload.  Outpatient workup for COPD was recommended.     Patient was last seen in clinic by Sande Rives, PA-C 11/17/2018.  Creatinine was worse on labs and Dr. Tamala Julian recommended stopping Lasix and increasing Imdur. A Myoview had been ordered but Dr. Tamala Julian recommended canceling this.  Cardiac catheterization is being avoided due to poor renal function.   She returns for follow-up.  She has not had any change in her breathing.  She has not had any leg swelling, paroxysmal nocturnal dyspnea.  She sleeps on 2 pillows chronically.  She does note exertional chest tightness.  This is unchanged.  She does not have nitroglycerin at home.  She has not had syncope.    Prior CV studies:   The following studies were reviewed today:  Echocardiogram 11/02/2018 EF 60-65, mild LVH, grade 1 diastolic dysfunction (impaired  relaxation), moderate LAE, moderate MAC, trace MR, mild TR, moderate aortic valve sclerosis without stenosis  Event monitor 07/08/2017  NSR  PAC's  No atrial fibrillation, tachycardia, or pauses  Echocardiogram 06/03/2017 Moderate concentric LVH, EF 65-70, normal wall motion, grade 1 diastolic dysfunction, mild aortic stenosis, mild aortic insufficiency, MAC, mild LAE, mild TR  Myoview 03/01/2014 Low risk stress nuclear study. There is  evidence of small inferolateral scar with minimal peri-infarct ischemia. SDS=4, new since 2008. Normal LV systolic function.  LV Ejection Fraction: 53%   Past Medical History:  Diagnosis Date  . Anemia 04/03/2011  . Anginal pain ([place])   . Anxiety   . Arthritis    "feet" (06/02/2017)  . Blind left eye 1999  . Depression   . Exertional dyspnea   . GERD (gastroesophageal reflux disease)   . High cholesterol   . Hip fracture ([place]) 07/17/11   fall from 1-2 feet; left  . Hypertension   . Ischemic stroke ([place]) 06/02/2017   "weak moving around" (06/02/2017)  . Prosthetic eye globe 1999   "from diabetic; left eye"  . Tongue cancer ([place]) 1998   S/P radiation  . Type II diabetes mellitus ([place])    not taking any medication (06/02/2017)   Surgical Hx: The patient  has a past surgical history that includes Tubal ligation (1970's); Tongue Biopsy (1998); Intraocular prosthesis insertion (1999); Hip  Arthroplasty (07/18/2011); Fracture surgery; and Enucleation (Left).   Current Medications: Current Meds  Medication Sig  . acetaminophen (TYLENOL) 325 MG tablet Take 2 tablets (650 mg total) by mouth every 6 (six) hours as needed for mild pain or fever (or temp > 37.5 C (99.5 F)).  . ALPHAGAN P 0.1 % SOLN Place 1 drop into the right eye 2 (two) times daily.  Marland Kitchen amLODipine (NORVASC) 10 MG tablet Take 10 mg by mouth daily.  Marland Kitchen aspirin 81 MG tablet Take 81 mg by mouth daily.  Marland Kitchen atorvastatin (LIPITOR) 40 MG tablet Take 1 tablet (40 mg total) by mouth daily at 6 PM.  .  carvedilol (COREG) 25 MG tablet Take 1 tablet (25 mg total) by mouth 2 (two) times daily with a meal. Take one daily if she does not feel better she will take another at about 2pm  . folic acid (FOLVITE) 1 MG tablet Take 1 tablet (1 mg total) by mouth daily.  Marland Kitchen gabapentin (NEURONTIN) 100 MG capsule Take 100 mg by mouth 3 (three) times daily.  . isosorbide mononitrate (IMDUR) 60 MG 24 hr tablet Take 1.5 tablets (90 mg total) by mouth daily.  Marland Kitchen lisinopril (ZESTRIL) 40 MG tablet Take 40 mg by mouth daily.  Marland Kitchen loratadine (CLARITIN) 10 MG tablet Take 10 mg by mouth daily.  . Multiple Vitamin (MULTIVITAMIN WITH MINERALS) TABS tablet Take 1 tablet by mouth daily.  Marland Kitchen omeprazole (PRILOSEC) 20 MG capsule Take 1 capsule (20 mg total) by mouth daily.  . TRAVATAN Z 0.004 % SOLN ophthalmic solution Place 1 drop into the right eye at bedtime.   . [DISCONTINUED] atorvastatin (LIPITOR) 20 MG tablet take 1 tablet by mouth once daily (CHANGING FROM SIMVASTATIN)  . [DISCONTINUED] isosorbide mononitrate (IMDUR) 60 MG 24 hr tablet Take 1 tablet (60 mg total) by mouth daily.     Allergies:   Patient has no known allergies.   Social History   Tobacco Use  . Smoking status: Former Smoker    Packs/day: 1.50    Years: 47.00    Pack years: 70.50    Types: Cigarettes    Quit date: 09/13/1997    Years since quitting: 21.2  . Smokeless tobacco: Never Used  Substance Use Topics  . Alcohol use: Yes    Alcohol/week: 17.0 standard drinks    Types: 17 Shots of liquor per week    Comment: 06/02/2017 "1/5 rum q weekend"  . Drug use: No    Types: Marijuana, Heroin    Comment: 06/02/2017  "last drug use was in the 1980s"     Family Hx: The patient's family history includes Diabetes type II in her father, mother, and sister. There is no history of Colon cancer.  ROS:   Please see the history of present illness.    Review of Systems  Respiratory: Positive for cough (nonprod).   Gastrointestinal: Negative for  hematochezia.  Genitourinary: Negative for hematuria.   All other systems reviewed and are negative.   EKGs/Labs/Other Test Reviewed:    EKG:  EKG is  ordered today.  The ekg ordered today demonstrates normal sinus rhythm, heart rate 64, normal axis, LVH with T wave inversions 2, 3, aVF, V5-V6 (repolarization abnormality), QTC 406, similar to prior tracings  Recent Labs: 02/21/2018: ALT 26 11/01/2018: B Natriuretic Peptide 244.8 11/03/2018: Hemoglobin 11.5; Platelets 192 11/10/2018: NT-Pro BNP 924 11/17/2018: BUN 37; Creatinine, Ser 2.74; Potassium 4.5; Sodium 132   Recent Lipid Panel Lab Results  Component Value Date/Time  CHOL 141 11/02/2018 03:58 AM   TRIG 148 11/02/2018 03:58 AM   HDL 40 (L) 11/02/2018 03:58 AM   CHOLHDL 3.5 11/02/2018 03:58 AM   LDLCALC 71 11/02/2018 03:58 AM    Physical Exam:    VS:  BP (!) 144/72   Pulse 64   Ht 5' (1.524 m)   Wt 153 lb 6.4 oz (69.6 kg)   SpO2 98%   BMI 29.96 kg/m     Wt Readings from Last 3 Encounters:  11/26/18 153 lb 6.4 oz (69.6 kg)  11/17/18 150 lb 12.8 oz (68.4 kg)  11/10/18 150 lb 12.8 oz (68.4 kg)     Physical Exam  Constitutional: She is oriented to person, place, and time. She appears well-developed and well-nourished. No distress.  HENT:  Head: Normocephalic and atraumatic.  Eyes: No scleral icterus.  Neck: No JVD present. No thyromegaly present.  Cardiovascular: Normal rate and regular rhythm.  Murmur heard.  Systolic murmur is present with a grade of 2/6 at the upper right sternal border. Pulmonary/Chest: Effort normal and breath sounds normal. She has no rales.  Abdominal: Soft. There is no hepatomegaly.  Musculoskeletal:        General: No edema.  Lymphadenopathy:    She has no cervical adenopathy.  Neurological: She is alert and oriented to person, place, and time.  Skin: Skin is warm and dry.  Psychiatric: She has a normal mood and affect.    ASSESSMENT & PLAN:    1. Angina pectoris ([place]) She has  fairly stable exertional angina.  Given her abnormal Myoview in 2019, presumed she has coronary artery disease.  She is on max dose amlodipine and beta-blocker with carvedilol.  I will increase her isosorbide to 90 mg daily.  Consider adding ranolazine if she has inadequate response to isosorbide.  She has follow-up in 2 weeks already planned.  Keep that appointment to reassess her response to antianginal therapy.  I will further maximize her therapy for coronary artery disease.  Increase atorvastatin to 40 mg daily.  Continue aspirin.  Prescription given for as needed nitroglycerin.  If she fails medical therapy or has worsening symptoms, we may need to consider cardiac catheterization.  2. Chronic diastolic CHF (congestive heart failure) (HCC) Volume status appears stable off diuretic therapy.  3. CKD (chronic kidney disease) stage 4, GFR 15-29 ml/min (HCC) Furosemide was stopped after last visit.  Repeat BMET today.  4. Essential hypertension Blood pressure above target.  Adjust medications as outlined above.  5. Hyperlipidemia, unspecified hyperlipidemia type Increase atorvastatin to 40 mg daily.  Arrange lipids and LFTs in 3 months.  Dispo:  Return in 10 days (on 12/06/2018) for Scheduled Follow Up w/ Cecilie Kicks, via Telemedicine.   Medication Adjustments/Labs and Tests Ordered: Current medicines are reviewed at length with the patient today.  Concerns regarding medicines are outlined above.  Tests Ordered: Orders Placed This Encounter  Procedures  . Basic Metabolic Panel (BMET)  . Hepatic function panel  . Lipid Profile  . EKG 12-Lead   Medication Changes: Meds ordered this encounter  Medications  . isosorbide mononitrate (IMDUR) 60 MG 24 hr tablet    Sig: Take 1.5 tablets (90 mg total) by mouth daily.    Dispense:  120 tablet    Refill:  1  . atorvastatin (LIPITOR) 40 MG tablet    Sig: Take 1 tablet (40 mg total) by mouth daily at 6 PM.    Dispense:  90 tablet    Refill:  3  . nitroGLYCERIN (NITROSTAT) 0.4 MG SL tablet    Sig: Place 1 tablet (0.4 mg total) under the tongue every 5 (five) minutes as needed for chest pain.    Dispense:  25 tablet    Refill:  3    Signed, [name], PA-C  11/26/2018 1:48 PM    [facility] [address], [place], [place]  [address] Phone: [phone]; Fax: [phone]

## 2018-11-26 ENCOUNTER — Ambulatory Visit (INDEPENDENT_AMBULATORY_CARE_PROVIDER_SITE_OTHER): Payer: Medicare HMO | Admitting: Physician Assistant

## 2018-11-26 ENCOUNTER — Encounter: Payer: Self-pay | Admitting: Physician Assistant

## 2018-11-26 ENCOUNTER — Other Ambulatory Visit: Payer: Self-pay

## 2018-11-26 VITALS — BP 144/72 | HR 64 | Ht 60.0 in | Wt 153.4 lb

## 2018-11-26 DIAGNOSIS — N184 Chronic kidney disease, stage 4 (severe): Secondary | ICD-10-CM | POA: Diagnosis not present

## 2018-11-26 DIAGNOSIS — I209 Angina pectoris, unspecified: Secondary | ICD-10-CM

## 2018-11-26 DIAGNOSIS — I10 Essential (primary) hypertension: Secondary | ICD-10-CM

## 2018-11-26 DIAGNOSIS — E785 Hyperlipidemia, unspecified: Secondary | ICD-10-CM

## 2018-11-26 DIAGNOSIS — I5032 Chronic diastolic (congestive) heart failure: Secondary | ICD-10-CM | POA: Diagnosis not present

## 2018-11-26 MED ORDER — ISOSORBIDE MONONITRATE ER 60 MG PO TB24: 90.0000 mg | ORAL_TABLET | Freq: Every day | ORAL | 1 refills | Status: DC

## 2018-11-26 MED ORDER — ATORVASTATIN CALCIUM 40 MG PO TABS: 40.0000 mg | ORAL_TABLET | Freq: Every day | ORAL | 3 refills | Status: DC

## 2018-11-26 MED ORDER — NITROGLYCERIN 0.4 MG SL SUBL: 0.4000 mg | SUBLINGUAL_TABLET | SUBLINGUAL | 3 refills | Status: DC | PRN

## 2018-11-26 NOTE — Patient Instructions (Addendum)
Medication Instructions:  Your physician has recommended you make the following change in your medication:  1.  Increase Imdur one and one half tablet (90 mg) daily.   2.  Increase Lipitor one tablet (40 mg ) daily.  You can take two tablets (40 mg) and use them up. 3.  Start nitro (0.4 mg) sublingual, under your tongue.   .  If a single episode of chest pain is not relieved by one tablet, the patient will try another tablet within 5 minutes; the patient can try one more tablet within 5 minutes and if this does not relieve the pain, the patient is instructed to call 911 for transportation to an emergency department.   *If you need a refill on your cardiac medications before your next appointment, please call your pharmacy*  Lab Work: Your physician recommends that have lab work today: BMET Your physician recommends that you return for a FASTING lipid/lft: February 10 between 8-5.   If you have labs (blood work) drawn today and your tests are completely normal, you will receive your results only by: Marland Kitchen MyChart Message (if you have MyChart) OR . A paper copy in the mail If you have any lab test that is abnormal or we need to change your treatment, we will call you to review the results.  Testing/Procedures: -None  Follow-Up: At [facility], you and your health needs are our priority.  As part of our continuing mission to provide you with exceptional heart care, we have created designated Provider Care Teams.  These Care Teams include your primary Cardiologist (physician) and Advanced Practice Providers (APPs -  Physician Assistants and Nurse Practitioners) who all work together to provide you with the care you need, when you need it.  Your next appointment:   2 weeks on November 23 on the phone.   The format for your next appointment:   Virtual Visit   Provider:   Cecilie Kicks, NP  Other Instructions

## 2018-11-27 LAB — BASIC METABOLIC PANEL
BUN/Creatinine Ratio: 14 (ref 12–28)
BUN: 24 mg/dL (ref 8–27)
CO2: 24 mmol/L (ref 20–29)
Calcium: 9.1 mg/dL (ref 8.7–10.3)
Chloride: 93 mmol/L — ABNORMAL LOW (ref 96–106)
Creatinine, Ser: 1.71 mg/dL — ABNORMAL HIGH (ref 0.57–1.00)
GFR calc Af Amer: 31 mL/min/{1.73_m2} — ABNORMAL LOW (ref >59)
GFR calc non Af Amer: 27 mL/min/{1.73_m2} — ABNORMAL LOW (ref >59)
Glucose: 139 mg/dL — ABNORMAL HIGH (ref 65–99)
Potassium: 5.3 mmol/L — ABNORMAL HIGH (ref 3.5–5.2)
Sodium: 128 mmol/L — ABNORMAL LOW (ref 134–144)

## 2018-12-06 ENCOUNTER — Encounter: Payer: Self-pay | Admitting: Cardiology

## 2018-12-06 ENCOUNTER — Other Ambulatory Visit: Payer: Self-pay | Admitting: *Deleted

## 2018-12-06 ENCOUNTER — Other Ambulatory Visit: Payer: Self-pay

## 2018-12-06 ENCOUNTER — Telehealth (INDEPENDENT_AMBULATORY_CARE_PROVIDER_SITE_OTHER): Payer: Medicare HMO | Admitting: Cardiology

## 2018-12-06 ENCOUNTER — Telehealth: Payer: Self-pay

## 2018-12-06 VITALS — BP 165/79 | HR 66

## 2018-12-06 DIAGNOSIS — N183 Chronic kidney disease, stage 3 unspecified: Secondary | ICD-10-CM

## 2018-12-06 DIAGNOSIS — E785 Hyperlipidemia, unspecified: Secondary | ICD-10-CM

## 2018-12-06 DIAGNOSIS — I130 Hypertensive heart and chronic kidney disease with heart failure and stage 1 through stage 4 chronic kidney disease, or unspecified chronic kidney disease: Secondary | ICD-10-CM | POA: Diagnosis not present

## 2018-12-06 DIAGNOSIS — I209 Angina pectoris, unspecified: Secondary | ICD-10-CM

## 2018-12-06 DIAGNOSIS — R079 Chest pain, unspecified: Secondary | ICD-10-CM | POA: Diagnosis not present

## 2018-12-06 DIAGNOSIS — E1169 Type 2 diabetes mellitus with other specified complication: Secondary | ICD-10-CM

## 2018-12-06 DIAGNOSIS — I5032 Chronic diastolic (congestive) heart failure: Secondary | ICD-10-CM

## 2018-12-06 DIAGNOSIS — I10 Essential (primary) hypertension: Secondary | ICD-10-CM

## 2018-12-06 DIAGNOSIS — Z79899 Other long term (current) drug therapy: Secondary | ICD-10-CM

## 2018-12-06 DIAGNOSIS — Z87891 Personal history of nicotine dependence: Secondary | ICD-10-CM

## 2018-12-06 DIAGNOSIS — E875 Hyperkalemia: Secondary | ICD-10-CM

## 2018-12-06 NOTE — Patient Instructions (Addendum)
Medication Instructions:  Your physician recommends that you continue on your current medications as directed. Please refer to the Current Medication list given to you today.  *If you need a refill on your cardiac medications before your next appointment, please call your pharmacy*  Lab Work: FUTURE:  BMET on 12/07/2018  If you have labs (blood work) drawn today and your tests are completely normal, you will receive your results only by: Marland Kitchen MyChart Message (if you have MyChart) OR . A paper copy in the mail If you have any lab test that is abnormal or we need to change your treatment, we will call you to review the results.  Testing/Procedures: None   Follow-Up: You are scheduled to see Pecolia Ades, NP on 12/29/2018 @ 11:30 AM  Other Instructions None

## 2018-12-06 NOTE — Progress Notes (Signed)
Virtual Visit via Telephone Note   This visit type was conducted due to national recommendations for restrictions regarding the COVID-19 Pandemic (e.g. social distancing) in an effort to limit this patient's exposure and mitigate transmission in our community.  Due to her co-morbid illnesses, this patient is at least at moderate risk for complications without adequate follow up.  This format is felt to be most appropriate for this patient at this time.  The patient did not have access to video technology/had technical difficulties with video requiring transitioning to audio format only (telephone).  All issues noted in this document were discussed and addressed.  No physical exam could be performed with this format.  Please refer to the patient's chart for her  consent to telehealth for [facility].   Date:  12/07/2018   ID:  [name], DOB 1932-06-15, MRN [id number]  Patient Location: Home Provider Location: Office  PCP:  Dierdre Harness, FNP  Cardiologist:  Sinclair Grooms, MD  Electrophysiologist:  None   Evaluation Performed:  Follow-Up Visit  Chief Complaint:  Chest pain   History of Present Illness:    [name] is a 85 y.o. female with CHF and angina.     Chronic Diastolic CHF  Chest pain  ? Myoview 5/19: small inf basal ischemia, low risk >> Med Rx. ? Avoiding cath due to chronic kidney disease  ? admx 10/2018, diuresed for possible CHF; med Rx; antianginals adjusted   Hx of CVA  Hypertension   Hyperlipidemia   Diabetes mellitus 2 (diet)  Chronic kidney disease   Tongue CA s/p radiation in [place] in 1990s  Tobacco use  GERD   admitted in 10/2018 with chest pain and hypoxia.  EF was normal on Echocardiogram.  A DDimer was elevated and VQ scan was low probability and venous US was neg for DVT.    Hs-Troponin was mildly elevated without clear trend.  She was started on Lasix to cover for possible volume overload.  Outpatient workup for COPD  was recommended.     Patient was last seen in clinic by Sande Rives, PA-C 11/17/2018.  Creatinine was worse on labs and Dr. Tamala Julian recommended stopping Lasix and increasing Imdur. A Myoview had been ordered but Dr. Tamala Julian recommended canceling this.  Cardiac catheterization is being avoided due to poor renal function.   She returns for follow-up.  She has not had any change in her breathing.  She has not had any leg swelling, paroxysmal nocturnal dyspnea.  She sleeps on 2 pillows chronically.  She does note exertional chest tightness.  This is unchanged.  She does not have nitroglycerin at home.  She has not had syncope.   Imdur increased to 90 mg may need ranolazine  Lipitor givne  Dr. Tamala Julian had cancelled her stress test.  Trying not to have cardiac cath due to renal function.   Cr 1.71 na 128 K+ 5.3 on last visit.   Today she tells me she continues with chest pain.  The increase of imdur has helped.  She continues to take NTG sl with improvement.  Her BP is still elevated -her greatest complaint is chest pain though improving.   Needs BMP    The patient does not have symptoms concerning for COVID-19 infection (fever, chills, cough, or new shortness of breath).    Past Medical History:  Diagnosis Date  . Anemia 04/03/2011  . Anginal pain ([place])   . Anxiety   . Arthritis    "feet" (06/02/2017)  .  Blind left eye 1999  . Depression   . Exertional dyspnea   . GERD (gastroesophageal reflux disease)   . High cholesterol   . Hip fracture ([place]) 07/17/11   fall from 1-2 feet; left  . Hypertension   . Ischemic stroke ([place]) 06/02/2017   "weak moving around" (06/02/2017)  . Prosthetic eye globe 1999   "from diabetic; left eye"  . Tongue cancer ([place]) 1998   S/P radiation  . Type II diabetes mellitus ([place])    not taking any medication (06/02/2017)   Past Surgical History:  Procedure Laterality Date  . ENUCLEATION Left   . FRACTURE SURGERY    . HIP ARTHROPLASTY  07/18/2011   Procedure:  ARTHROPLASTY BIPOLAR HIP;  Surgeon: Nita Sells, MD;  Location: [facility];  Service: Orthopedics;  Laterality: Left;  . INTRAOCULAR PROSTHESES INSERTION  1999   left  . TONGUE BIOPSY  1998   "cancer"  . TUBAL LIGATION  1970's     Current Meds  Medication Sig  . acetaminophen (TYLENOL) 325 MG tablet Take 2 tablets (650 mg total) by mouth every 6 (six) hours as needed for mild pain or fever (or temp > 37.5 C (99.5 F)).  . ALPHAGAN P 0.1 % SOLN Place 1 drop into the right eye 2 (two) times daily.  Marland Kitchen amLODipine (NORVASC) 10 MG tablet Take 10 mg by mouth daily.  Marland Kitchen aspirin 81 MG tablet Take 81 mg by mouth daily.  Marland Kitchen atorvastatin (LIPITOR) 40 MG tablet Take 1 tablet (40 mg total) by mouth daily at 6 PM.  . carvedilol (COREG) 25 MG tablet Take 1 tablet (25 mg total) by mouth 2 (two) times daily with a meal. Take one daily if she does not feel better she will take another at about 2pm  . folic acid (FOLVITE) 1 MG tablet Take 1 tablet (1 mg total) by mouth daily.  Marland Kitchen gabapentin (NEURONTIN) 100 MG capsule Take 100 mg by mouth 3 (three) times daily.  . isosorbide mononitrate (IMDUR) 60 MG 24 hr tablet Take 1.5 tablets (90 mg total) by mouth daily.  Marland Kitchen lisinopril (ZESTRIL) 40 MG tablet Take 40 mg by mouth daily.  Marland Kitchen loratadine (CLARITIN) 10 MG tablet Take 10 mg by mouth daily.  . Multiple Vitamin (MULTIVITAMIN WITH MINERALS) TABS tablet Take 1 tablet by mouth daily.  . nitroGLYCERIN (NITROSTAT) 0.4 MG SL tablet Place 1 tablet (0.4 mg total) under the tongue every 5 (five) minutes as needed for chest pain.  Marland Kitchen omeprazole (PRILOSEC) 20 MG capsule Take 1 capsule (20 mg total) by mouth daily.  . TRAVATAN Z 0.004 % SOLN ophthalmic solution Place 1 drop into the right eye at bedtime.      Allergies:   Patient has no known allergies.   Social History   Tobacco Use  . Smoking status: Former Smoker    Packs/day: 1.50    Years: 47.00    Pack years: 70.50    Types: Cigarettes    Quit date: 09/13/1997     Years since quitting: 21.2  . Smokeless tobacco: Never Used  Substance Use Topics  . Alcohol use: Yes    Alcohol/week: 17.0 standard drinks    Types: 17 Shots of liquor per week    Comment: 06/02/2017 "1/5 rum q weekend"  . Drug use: No    Types: Marijuana, Heroin    Comment: 06/02/2017  "last drug use was in the 1980s"     Family Hx: The patient's family history includes Diabetes type  II in her father, mother, and sister. There is no history of Colon cancer.  ROS:   Please see the history of present illness.    General:no colds or fevers, no weight changes Skin:no rashes or ulcers HEENT:no blurred vision, no congestion CV:see HPI PUL:see HPI GI:no diarrhea constipation or melena, no indigestion GU:no hematuria, no dysuria MS:no joint pain, no claudication Neuro:no syncope, no lightheadedness Endo:no diabetes, no thyroid disease  All other systems reviewed and are negative.   Prior CV studies:   The following studies were reviewed today:  Echo 11/02/18  IMPRESSIONS    1. Left ventricular ejection fraction, by visual estimation, is 60 to 65%. The left ventricle has normal function. Normal left ventricular size. Left ventricular septal wall thickness was mildly increased. Mildly increased left ventricular posterior  wall thickness.  2. Left ventricular diastolic Doppler parameters are consistent with impaired relaxation pattern of LV diastolic filling.  3. Elevated left ventricular end-diastolic pressure.  4. Global right ventricle has normal systolic function.The right ventricular size is normal. No increase in right ventricular wall thickness.  5. Left atrial size was moderately dilated.  6. Right atrial size was normal.  7. Moderate mitral annular calcification.  8. The mitral valve is normal in structure. Trace mitral valve regurgitation. No evidence of mitral stenosis.  9. The tricuspid valve is normal in structure. Tricuspid valve regurgitation is mild. 10. The  aortic valve is tricuspid. Aortic valve regurgitation is trivial by color flow Doppler. Moderate aortic valve sclerosis/calcification without any evidence of aortic stenosis. 11. The pulmonic valve was normal in structure. Pulmonic valve regurgitation is mild by color flow Doppler. 12. Normal pulmonary artery systolic pressure. 13. The inferior vena cava is normal in size with greater than 50% respiratory variability, suggesting right atrial pressure of 3 mmHg.  FINDINGS  Left Ventricle: Left ventricular ejection fraction, by visual estimation, is 60 to 65%. The left ventricle has normal function. Left ventricular septal wall thickness was mildly increased. Mildly increased left ventricular posterior wall thickness.  There is no left ventricular hypertrophy. Normal left ventricular size. Spectral Doppler shows Left ventricular diastolic Doppler parameters are consistent with impaired relaxation pattern of LV diastolic filling. Elevated left ventricular end-diastolic  pressure.  Right Ventricle: The right ventricular size is normal. No increase in right ventricular wall thickness. Global RV systolic function is has normal systolic function. The tricuspid regurgitant velocity is 2.55 m/s, and with an assumed right atrial pressure  of 3 mmHg, the estimated right ventricular systolic pressure is normal at 29.0 mmHg.  Left Atrium: Left atrial size was moderately dilated.  Right Atrium: Right atrial size was normal in size  Pericardium: There is no evidence of pericardial effusion.  Mitral Valve: The mitral valve is normal in structure. Moderate mitral annular calcification. No evidence of mitral valve stenosis by observation. Trace mitral valve regurgitation.  Tricuspid Valve: The tricuspid valve is normal in structure. Tricuspid valve regurgitation is mild by color flow Doppler.  Aortic Valve: The aortic valve is tricuspid. Aortic valve regurgitation is trivial by color flow Doppler.  Aortic regurgitation PHT measures 652 msec. Moderate aortic valve sclerosis/calcification is present, without any evidence of aortic stenosis.  Pulmonic Valve: The pulmonic valve was normal in structure. Pulmonic valve regurgitation is mild by color flow Doppler.  Aorta: The aortic root, ascending aorta and aortic arch are all structurally normal, with no evidence of dilitation or obstruction.  Venous: The inferior vena cava is normal in size with greater than 50% respiratory variability,  suggesting right atrial pressure of 3 mmHg.  IAS/Shunts: No atrial level shunt detected by color flow Doppler. No ventricular septal defect is seen or detected. There is no evidence of an atrial septal defect.    Labs/Other Tests and Data Reviewed:    EKG:  An ECG dated 11/26/18 was personally reviewed today and demonstrated:  SR with T wave inversions inf lateral.   Recent Labs: 02/21/2018: ALT 26 11/01/2018: B Natriuretic Peptide 244.8 11/03/2018: Hemoglobin 11.5; Platelets 192 11/10/2018: NT-Pro BNP 924 11/26/2018: BUN 24; Creatinine, Ser 1.71; Potassium 5.3; Sodium 128   Recent Lipid Panel Lab Results  Component Value Date/Time   CHOL 141 11/02/2018 03:58 AM   TRIG 148 11/02/2018 03:58 AM   HDL 40 (L) 11/02/2018 03:58 AM   CHOLHDL 3.5 11/02/2018 03:58 AM   LDLCALC 71 11/02/2018 03:58 AM    Wt Readings from Last 3 Encounters:  11/26/18 153 lb 6.4 oz (69.6 kg)  11/17/18 150 lb 12.8 oz (68.4 kg)  11/10/18 150 lb 12.8 oz (68.4 kg)     Objective:    Vital Signs:  BP (!) 165/79   Pulse 66    VITAL SIGNS:  reviewed  Neuro: oriented X 3 answers questions approp Pulmonary can speak in complete sentences without SOB General no acute distress  ASSESSMENT & PLAN:    1. Chest pain, Dr. Tamala Julian felt we would treat medically and if no control at that time will do cath.  No nuc.  Her chest pain in improved some but still her greatest complaint.  Will increase Imdur to 120 mg daily.  Will  have her follow up for addition of ranexa if still with pain. She is on coreg 25 BID and amlodipine 10 mg daily.   2. Uncontrolled HTN still present with higher dose of Imdur this may help. 3. Chronic diastolic HF not so much SOB now.  Will check BMP - on higher dose of lasix Cr increased.  Now back to baseline no longer on lasix.   4. CKD -3 with increase of Cr with lasix 5. HLD continue statin  COVID-19 Education: The signs and symptoms of COVID-19 were discussed with the patient and how to seek care for testing (follow up with PCP or arrange E-visit).  The importance of social distancing was discussed today.  Time:   Today, I have spent 10 minutes with the patient with telehealth technology discussing the above problems.     Medication Adjustments/Labs and Tests Ordered: Current medicines are reviewed at length with the patient today.  Concerns regarding medicines are outlined above.   Tests Ordered: Orders Placed This Encounter  Procedures  . Basic metabolic panel    Medication Changes: No orders of the defined types were placed in this encounter.   Follow Up:  In Person in 1 month(s)  Signed, Cecilie Kicks, NP  12/07/2018 3:39 PM    [facility]

## 2018-12-06 NOTE — Telephone Encounter (Signed)

## 2018-12-07 ENCOUNTER — Other Ambulatory Visit: Payer: Medicare HMO

## 2018-12-13 ENCOUNTER — Other Ambulatory Visit: Payer: Self-pay

## 2018-12-13 ENCOUNTER — Other Ambulatory Visit: Payer: Medicare HMO | Admitting: *Deleted

## 2018-12-13 DIAGNOSIS — E875 Hyperkalemia: Secondary | ICD-10-CM

## 2018-12-13 LAB — BASIC METABOLIC PANEL
BUN/Creatinine Ratio: 17 (ref 12–28)
BUN: 40 mg/dL — ABNORMAL HIGH (ref 8–27)
CO2: 21 mmol/L (ref 20–29)
Calcium: 9.5 mg/dL (ref 8.7–10.3)
Chloride: 93 mmol/L — ABNORMAL LOW (ref 96–106)
Creatinine, Ser: 2.41 mg/dL — ABNORMAL HIGH (ref 0.57–1.00)
GFR calc Af Amer: 21 mL/min/{1.73_m2} — ABNORMAL LOW (ref >59)
GFR calc non Af Amer: 18 mL/min/{1.73_m2} — ABNORMAL LOW (ref >59)
Glucose: 107 mg/dL — ABNORMAL HIGH (ref 65–99)
Potassium: 5.2 mmol/L (ref 3.5–5.2)
Sodium: 126 mmol/L — ABNORMAL LOW (ref 134–144)

## 2018-12-14 ENCOUNTER — Telehealth: Payer: Self-pay | Admitting: *Deleted

## 2018-12-14 DIAGNOSIS — I5032 Chronic diastolic (congestive) heart failure: Secondary | ICD-10-CM

## 2018-12-14 DIAGNOSIS — N183 Chronic kidney disease, stage 3 unspecified: Secondary | ICD-10-CM

## 2018-12-14 DIAGNOSIS — N184 Chronic kidney disease, stage 4 (severe): Secondary | ICD-10-CM

## 2018-12-14 NOTE — Telephone Encounter (Signed)
[facility] Visit Initial Request  Date of Request ([place]):  December 14, 2018  Requesting Provider:  [name], [facility]     Agency Requested:    [facility] Contact:  Glory Buff, NP [address] [place], [place] [address] Phone #:  [phone] Fax #:  [phone]  Patient Demographic Information: Name:  [name] Age:  85 y.o.   DOB:  1932-03-31  MRN:  [id number]   Address:   [place] [address]   Phone Numbers:   Home Phone [phone]  Mobile [phone]     Emergency Contact Information on File:   Contact Information    Name Relation Home Work [place] Granddaughter   [place]   [phone]      The above family members may be contacted for information on this patient (review DPR on file):  Yes    Patient Clinical Information:  Primary Care Provider:  Dierdre Harness, [place]  Primary Cardiologist:  Sinclair Grooms, MD  Primary Electrophysiologist:  None   Past Medical Hx: [name]  has a past medical history of Anemia (04/03/2011), Anginal pain ([place]), Anxiety, Arthritis, Blind left eye (1999), Depression, Exertional dyspnea, GERD (gastroesophageal reflux disease), High cholesterol, Hip fracture ([place]) (07/17/11), Hypertension, Ischemic stroke ([place]) (06/02/2017), Prosthetic eye globe (1999), Tongue cancer ([place]) (1998), and Type II diabetes mellitus ([place]).   Allergies: She has No Known Allergies.   Medications: Current Outpatient Medications on File Prior to Visit  Medication Sig  . acetaminophen (TYLENOL) 325 MG tablet Take 2 tablets (650 mg total) by mouth every 6 (six) hours as needed for mild pain or fever (or temp > 37.5 C (99.5 F)).  . ALPHAGAN P 0.1 % SOLN Place 1 drop into the right eye 2 (two) times daily.  Marland Kitchen amLODipine (NORVASC) 10 MG tablet Take 10 mg by mouth daily.  Marland Kitchen aspirin 81 MG tablet Take 81 mg by mouth daily.  Marland Kitchen  atorvastatin (LIPITOR) 40 MG tablet Take 1 tablet (40 mg total) by mouth daily at 6 PM.  . carvedilol (COREG) 25 MG tablet Take 1 tablet (25 mg total) by mouth 2 (two) times daily with a meal. Take one daily if she does not feel better she will take another at about 2pm  . folic acid (FOLVITE) 1 MG tablet Take 1 tablet (1 mg total) by mouth daily.  Marland Kitchen gabapentin (NEURONTIN) 100 MG capsule Take 100 mg by mouth 3 (three) times daily.  . isosorbide mononitrate (IMDUR) 60 MG 24 hr tablet Take 1.5 tablets (90 mg total) by mouth daily.  Marland Kitchen lisinopril (ZESTRIL) 40 MG tablet Take 40 mg by mouth daily.  Marland Kitchen loratadine (CLARITIN) 10 MG tablet Take 10 mg by mouth daily.  . Multiple Vitamin (MULTIVITAMIN WITH MINERALS) TABS tablet Take 1 tablet by mouth daily.  . nitroGLYCERIN (NITROSTAT) 0.4 MG SL tablet Place 1 tablet (0.4 mg total) under the tongue every 5 (five) minutes as needed for chest pain.  Marland Kitchen omeprazole (PRILOSEC) 20 MG capsule Take 1 capsule (20 mg total) by mouth daily.  . TRAVATAN Z 0.004 % SOLN ophthalmic solution Place 1 drop into the right eye at bedtime.    No current facility-administered medications on file prior to visit.      Social Hx: She  reports that she quit smoking about 21 years ago. Her smoking use included cigarettes. She has a 70.50 pack-year smoking history. She has  never used smokeless tobacco. She reports current alcohol use of about 17.0 standard drinks of alcohol per week. She reports that she does not use drugs.    Diagnosis/Reason for Visit:   LABS  Services Requested:  Vital Signs (BP, Pulse, O2, Weight)  Physical Exam  Medication Reconciliation  Labs:  BMET  # of Visits Needed/Frequency per Week: BMET TO BE DONE IN 1 WEEK

## 2018-12-14 NOTE — Progress Notes (Signed)
Sent to [employer] [facility]	[place] after speaking with [name], PA and will set up for [facility] to draw a bmet.  Thank you.

## 2018-12-21 ENCOUNTER — Other Ambulatory Visit: Payer: Self-pay | Admitting: *Deleted

## 2018-12-21 DIAGNOSIS — E875 Hyperkalemia: Secondary | ICD-10-CM

## 2018-12-21 LAB — BASIC METABOLIC PANEL
BUN/Creatinine Ratio: 9 — ABNORMAL LOW (ref 12–28)
BUN: 27 mg/dL (ref 8–27)
CO2: 15 mmol/L — ABNORMAL LOW (ref 20–29)
Calcium: 9.2 mg/dL (ref 8.7–10.3)
Chloride: 96 mmol/L (ref 96–106)
Creatinine, Ser: 3.01 mg/dL — ABNORMAL HIGH (ref 0.57–1.00)
GFR calc Af Amer: 16 mL/min/{1.73_m2} — ABNORMAL LOW (ref >59)
GFR calc non Af Amer: 14 mL/min/{1.73_m2} — ABNORMAL LOW (ref >59)
Glucose: 171 mg/dL — ABNORMAL HIGH (ref 65–99)
Potassium: 5.3 mmol/L — ABNORMAL HIGH (ref 3.5–5.2)
Sodium: 130 mmol/L — ABNORMAL LOW (ref 134–144)

## 2018-12-21 NOTE — Progress Notes (Signed)
Home Visit on 12/7 on behalf of [facility].  [name] lives alone but has a granddaughter that calls or checks on her Qday.  [name] takes the bus to store, appts, etc.  States she quit smoking approx 2 yrs ago and still drinks a beer once in a while.  States she has hx anxiety all her life but it's worsened and comes for no reason.  Also c/o neck/jaw tightness, esp while she eats, has been to the ED for the same and kept for a few days w/no known dx.  Enc her to f/u w/her PCP.  States she coughs while she eats and doesn't like to eat as a result but can blend a smoothie and drink it w/o issue.  Denies fever, chills, shob (except w/heavy exertion), chest pain, but has had a sore throat x 2 weeks.  Denies n/v/d, has normal BM's, last being previous evening.  States she's lost approx 10 lbs since start of pandemic.  Expresses no other needs at this time but will be calling PCP for a f/u visit about the jaw/neck tightness.

## 2018-12-23 ENCOUNTER — Other Ambulatory Visit: Payer: Medicare HMO

## 2018-12-28 ENCOUNTER — Telehealth: Payer: Self-pay | Admitting: *Deleted

## 2018-12-28 NOTE — Telephone Encounter (Signed)
Pt has appt with Daune Perch on 12/16.  Bmet added to appt notes. Verbally told SW but will send a FYI.

## 2018-12-28 NOTE — Telephone Encounter (Signed)
-----   Message from [name], [place] sent at 12/28/2018 10:15 AM EST ----- She did not have a repeat BMET last week. Can we check on that? Thanks, [employer]

## 2018-12-29 ENCOUNTER — Ambulatory Visit: Payer: Medicare HMO | Admitting: Cardiology

## 2018-12-29 NOTE — Progress Notes (Deleted)
Cardiology Office Note:    Date:  12/29/2018   ID:  [name], DOB 16-Jul-1932, MRN [id number]  PCP:  Dierdre Harness, FNP  Cardiologist:  Sinclair Grooms, MD  Referring MD: Dierdre Harness, FNP   No chief complaint on file. ***  History of Present Illness:    [name] is a 85 y.o. female with a past medical history significant for chronic diastolic heart failure, hypertension, anemia, stroke, diabetes type 2, CKD stage III, blind left eye with prosthesis, exertional dyspnea, GERD and tongue cancer 1998.  The patient had a low risk Myoview in 05/2017, avoiding cardiac cath due to chronic kidney disease.    He was admitted in 10/2018 for chest pain and hypoxia.  EF was normal by echocardiogram.  D-dimer was elevated and VQ scan was low probability for PE.  Venous ultrasound was negative for DVT.  High-sensitivity troponin was mildly elevated without clear trend.  She was diuresed with IV Lasix for possible volume overload.  Outpatient work-up for COPD was recommended.  The patient was seen in the clinic on 11/17/2018.  Creatinine was worse on labs and Dr. Tamala Julian recommended stopping Lasix and increasing Imdur.  Continuing to avoid cardiac catheterization.  The patient was again seen on 12/06/2018 by telemedicine with continued complaints of chest pain.  The increase in Imdur was helping.  She continued to take sublingual nitroglycerin with improvement.  Imdur was increased to 120 mg daily.  Plan to add Ranexa if chest pain continues.   Needs metabolic panel  Cardiac studies     Past Medical History:  Diagnosis Date  . Anemia 04/03/2011  . Anginal pain ([place])   . Anxiety   . Arthritis    "feet" (06/02/2017)  . Blind left eye 1999  . Depression   . Exertional dyspnea   . GERD (gastroesophageal reflux disease)   . High cholesterol   . Hip fracture ([place]) 07/17/11   fall from 1-2 feet; left  . Hypertension   . Ischemic stroke ([place]) 06/02/2017   "weak moving  around" (06/02/2017)  . Prosthetic eye globe 1999   "from diabetic; left eye"  . Tongue cancer ([place]) 1998   S/P radiation  . Type II diabetes mellitus ([place])    not taking any medication (06/02/2017)    Past Surgical History:  Procedure Laterality Date  . ENUCLEATION Left   . FRACTURE SURGERY    . HIP ARTHROPLASTY  07/18/2011   Procedure: ARTHROPLASTY BIPOLAR HIP;  Surgeon: Nita Sells, MD;  Location: [facility];  Service: Orthopedics;  Laterality: Left;  . INTRAOCULAR PROSTHESES INSERTION  1999   left  . TONGUE BIOPSY  1998   "cancer"  . TUBAL LIGATION  1970's    Current Medications: No outpatient medications have been marked as taking for the 12/29/18 encounter (Appointment) with Daune Perch, NP.     Allergies:   Patient has no known allergies.   Social History   Socioeconomic History  . Marital status: Single    Spouse name: Not on file  . Number of children: Not on file  . Years of education: Not on file  . Highest education level: Not on file  Occupational History  . Not on file  Tobacco Use  . Smoking status: Former Smoker    Packs/day: 1.50    Years: 47.00    Pack years: 70.50    Types: Cigarettes    Quit date: 09/13/1997    Years since quitting: 21.3  . Smokeless  tobacco: Never Used  Substance and Sexual Activity  . Alcohol use: Yes    Alcohol/week: 17.0 standard drinks    Types: 17 Shots of liquor per week    Comment: 06/02/2017 "1/5 rum q weekend"  . Drug use: No    Types: Marijuana, Heroin    Comment: 06/02/2017  "last drug use was in the 1980s"  . Sexual activity: Not Currently  Other Topics Concern  . Not on file  Social History Narrative  . Not on file   Social Determinants of Health   Financial Resource Strain:   . Difficulty of Paying Living Expenses: Not on file  Food Insecurity:   . Worried About Charity fundraiser in the Last Year: Not on file  . Ran Out of Food in the Last Year: Not on file  Transportation Needs:   . Lack of  Transportation (Medical): Not on file  . Lack of Transportation (Non-Medical): Not on file  Physical Activity:   . Days of Exercise per Week: Not on file  . Minutes of Exercise per Session: Not on file  Stress:   . Feeling of Stress : Not on file  Social Connections:   . Frequency of Communication with Friends and Family: Not on file  . Frequency of Social Gatherings with Friends and Family: Not on file  . Attends Religious Services: Not on file  . Active Member of Clubs or Organizations: Not on file  . Attends Archivist Meetings: Not on file  . Marital Status: Not on file     Family History: The patient's ***family history includes Diabetes type II in her father, mother, and sister. There is no history of Colon cancer. ROS:   Please see the history of present illness.    *** All other systems reviewed and are negative.   EKG:  EKG is *** ordered today.  The ekg ordered today demonstrates ***  Recent Labs: 02/21/2018: ALT 26 11/01/2018: B Natriuretic Peptide 244.8 11/03/2018: Hemoglobin 11.5; Platelets 192 11/10/2018: NT-Pro BNP 924 12/20/2018: BUN 27; Creatinine, Ser 3.01; Potassium 5.3; Sodium 130   Recent Lipid Panel    Component Value Date/Time   CHOL 141 11/02/2018 0358   TRIG 148 11/02/2018 0358   HDL 40 (L) 11/02/2018 0358   CHOLHDL 3.5 11/02/2018 0358   VLDL 30 11/02/2018 0358   LDLCALC 71 11/02/2018 0358    Physical Exam:    VS:  There were no vitals taken for this visit.    Wt Readings from Last 6 Encounters:  12/20/18 146 lb 12.8 oz (66.6 kg)  11/26/18 153 lb 6.4 oz (69.6 kg)  11/17/18 150 lb 12.8 oz (68.4 kg)  11/10/18 150 lb 12.8 oz (68.4 kg)  11/03/18 145 lb 1.6 oz (65.8 kg)  02/23/18 144 lb 6.4 oz (65.5 kg)     Physical Exam***   ASSESSMENT:    1. Angina pectoris ([place])   2. Essential (primary) hypertension   3. Chronic diastolic CHF (congestive heart failure) (HCC)   4. Stage 3 chronic kidney disease, unspecified whether stage 3a  or 3b CKD   5. Hyperlipidemia, unspecified hyperlipidemia type    PLAN:    In order of problems listed above:  Chest pain -Dr. Tamala Julian felt that treating medically was best considering her renal disease.  If continues to be out of control could consider cardiac cath.  -Imdur has been increased to 120 mg daily ***?Ranexa -She continues on carvedilol 25 mg twice daily and amlodipine 10 mg daily  Hypertension, poorly controlled -Imdur recently increased to 120 mg.  Continues on amlodipine 10 mg, carvedilol 25 mg  Chronic diastolic heart failure -Treated in October with increase in Lasix due to possible volume overload.  Serum creatinine had increased.  Lasix was stopped. -Now at baseline  CKD stage III-IV -Labs from 12/20/2018 showed serum creatinine 3.01, potassium 5.3 -We will recheck metabolic panel today.  Hyperlipidemia -LDL 71 in 10/2018.  Continue on current atorvastatin 40 mg daily.   Medication Adjustments/Labs and Tests Ordered: Current medicines are reviewed at length with the patient today.  Concerns regarding medicines are outlined above. Labs and tests ordered and medication changes are outlined in the patient instructions below:  There are no Patient Instructions on file for this visit.   Signed, Daune Perch, NP  12/29/2018 6:09 AM    [place]

## 2018-12-31 ENCOUNTER — Telehealth: Payer: Self-pay

## 2018-12-31 NOTE — Telephone Encounter (Signed)
[name], PA-C  [name], [place]  Patient missed her appt with Pecolia Ades, NP yesterday.  She was supposed to have a repeat BMET. Please contact her to reschedule the BMET.  [name], PA-C   12/30/2018 5:04 PM    I called and spoke with patient today, 12/31/18 she will come in Monday 01/03/19 for repeat BMET.

## 2019-01-03 ENCOUNTER — Other Ambulatory Visit: Payer: Medicare HMO

## 2019-01-19 ENCOUNTER — Encounter (HOSPITAL_COMMUNITY): Payer: Self-pay

## 2019-01-19 ENCOUNTER — Emergency Department (HOSPITAL_COMMUNITY)
Admission: EM | Admit: 2019-01-19 | Discharge: 2019-01-20 | Disposition: A | Discharge status: Home / Self Care | Payer: Medicare Other | Attending: Emergency Medicine | Admitting: Emergency Medicine

## 2019-01-19 ENCOUNTER — Other Ambulatory Visit: Payer: Self-pay

## 2019-01-19 DIAGNOSIS — E1122 Type 2 diabetes mellitus with diabetic chronic kidney disease: Secondary | ICD-10-CM | POA: Diagnosis not present

## 2019-01-19 DIAGNOSIS — E119 Type 2 diabetes mellitus without complications: Secondary | ICD-10-CM | POA: Insufficient documentation

## 2019-01-19 DIAGNOSIS — R0789 Other chest pain: Secondary | ICD-10-CM | POA: Insufficient documentation

## 2019-01-19 DIAGNOSIS — Z79899 Other long term (current) drug therapy: Secondary | ICD-10-CM | POA: Diagnosis not present

## 2019-01-19 DIAGNOSIS — E875 Hyperkalemia: Secondary | ICD-10-CM | POA: Diagnosis not present

## 2019-01-19 DIAGNOSIS — R0602 Shortness of breath: Secondary | ICD-10-CM | POA: Insufficient documentation

## 2019-01-19 DIAGNOSIS — N183 Chronic kidney disease, stage 3 unspecified: Secondary | ICD-10-CM | POA: Diagnosis not present

## 2019-01-19 DIAGNOSIS — Z7982 Long term (current) use of aspirin: Secondary | ICD-10-CM | POA: Diagnosis not present

## 2019-01-19 DIAGNOSIS — Z87891 Personal history of nicotine dependence: Secondary | ICD-10-CM | POA: Insufficient documentation

## 2019-01-19 DIAGNOSIS — I130 Hypertensive heart and chronic kidney disease with heart failure and stage 1 through stage 4 chronic kidney disease, or unspecified chronic kidney disease: Secondary | ICD-10-CM | POA: Diagnosis not present

## 2019-01-19 DIAGNOSIS — R519 Headache, unspecified: Secondary | ICD-10-CM | POA: Diagnosis not present

## 2019-01-19 DIAGNOSIS — I5032 Chronic diastolic (congestive) heart failure: Secondary | ICD-10-CM | POA: Insufficient documentation

## 2019-01-19 DIAGNOSIS — M542 Cervicalgia: Secondary | ICD-10-CM | POA: Diagnosis not present

## 2019-01-19 LAB — CBC
HCT: 35.4 % — ABNORMAL LOW (ref 36.0–46.0)
Hemoglobin: 11.3 g/dL — ABNORMAL LOW (ref 12.0–15.0)
MCH: 29.9 pg (ref 26.0–34.0)
MCHC: 31.9 g/dL (ref 30.0–36.0)
MCV: 93.7 fL (ref 80.0–100.0)
Platelets: 216 10*3/uL (ref 150–400)
RBC: 3.78 MIL/uL — ABNORMAL LOW (ref 3.87–5.11)
RDW: 14.3 % (ref 11.5–15.5)
WBC: 5.8 10*3/uL (ref 4.0–10.5)
nRBC: 0 % (ref 0.0–0.2)

## 2019-01-19 LAB — BASIC METABOLIC PANEL
Anion gap: 10 (ref 5–15)
BUN: 35 mg/dL — ABNORMAL HIGH (ref 8–23)
CO2: 16 mmol/L — ABNORMAL LOW (ref 22–32)
Calcium: 8.9 mg/dL (ref 8.9–10.3)
Chloride: 101 mmol/L (ref 98–111)
Creatinine, Ser: 2.49 mg/dL — ABNORMAL HIGH (ref 0.44–1.00)
GFR calc Af Amer: 20 mL/min — ABNORMAL LOW (ref >60)
GFR calc non Af Amer: 17 mL/min — ABNORMAL LOW (ref >60)
Glucose, Bld: 149 mg/dL — ABNORMAL HIGH (ref 70–99)
Potassium: 5.8 mmol/L — ABNORMAL HIGH (ref 3.5–5.1)
Sodium: 127 mmol/L — ABNORMAL LOW (ref 135–145)

## 2019-01-19 NOTE — ED Triage Notes (Signed)
To triage via EMS. Pt woke up this morning at 8am with headache and neck stiffness.  Pt states she sometimes gets these headaches but normally doesn't last this long.   EMS BP 138/68 HR 77 RR 16 SpO2 95%

## 2019-01-19 NOTE — ED Provider Notes (Signed)
[facility] EMERGENCY DEPARTMENT Provider Note   CSN: [id number] Arrival date & time: 01/19/19  1418     History Chief Complaint  Patient presents with  . Headache    [name] is a 86 y.o. female with a history of ischemic stroke, left prosthetic eye globe, tongue cancer s/p radiation, DM Type II, HTN, alcohol abuse, hyperlipidemia who presents to the emergency department with a chief complaint of headache.  The patient reports that she awoke with this morning with a bilateral frontal headache that has persisted throughout the day.  She states that the headache radiates down her bilateral neck and into her chest.  She states that she feels as if her neck feels "tight". She took 2 extra strength Tylenol at home with no improvement in her headache, but reports that her headache has been gradually improving since she arrived in the ER.  She reports no improvement in her chest pain.  She reports that this is the third instance of similar headache radiating into her neck and chest over the last few weeks.  She also reports that she has been feeling more short of breath over the last week and a half.  States that she typically has to stop once to catch her breath on the way to the bus stop, but now is having to stop 3 times.  She also notes a nonproductive cough.  No known aggravating or alleviating factors for her symptoms.  She reports that she chronically sleeps with 2 pillows at home.  No recent change in sleeping since her neck pain began.  She denies having a sore throat, but states her throat feels "scratchy" when she eats and drinks. No dysphagia.   She denies fever, chills, visual changes, numbness, weakness, nausea, vomiting, abdominal pain, palpitations, orthopnea, leg swelling, nasal congestion, or rhinorrhea.   She stopped using tobacco 2 years ago.  Reports that she will occasionally drink alcohol, but has not had any alcoholic beverages in several weeks.  She  lives at home alone.  She ambulates with a cane at baseline.  Per chart review, she was last seen by cardiology on November 23.  She was having angina pectoris at that time and Dr. Tamala Julian felt chest pain could be treated medically and if no control could attempt cardiac catheterization.  No nuclear stress test.  Her Imdur was increased to 120 mg daily.    The history is provided by the patient. No language interpreter was used.       Past Medical History:  Diagnosis Date  . Anemia 04/03/2011  . Anginal pain ([place])   . Anxiety   . Arthritis    "feet" (06/02/2017)  . Blind left eye 1999  . Depression   . Exertional dyspnea   . GERD (gastroesophageal reflux disease)   . High cholesterol   . Hip fracture ([place]) 07/17/11   fall from 1-2 feet; left  . Hypertension   . Ischemic stroke ([place]) 06/02/2017   "weak moving around" (06/02/2017)  . Prosthetic eye globe 1999   "from diabetic; left eye"  . Type II diabetes mellitus ([place])    not taking any medication (06/02/2017)    Patient Active Problem List   Diagnosis Date Noted  . Acute kidney injury superimposed on chronic kidney disease ([place]) 11/01/2018  . Leukocytosis 02/22/2018  . Influenza A 02/21/2018  . Wheezy bronchitis 02/21/2018  . Acute wheezy bronchitis 02/21/2018  . Hypertension associated with stage 3 chronic kidney disease due to type  2 diabetes mellitus ([place]) 06/09/2017  . Dyslipidemia associated with type 2 diabetes mellitus ([place]) 06/09/2017  . Stage 3 chronic kidney disease due to type 2 diabetes mellitus ([place]) 06/09/2017  . Peripheral sensory neuropathy due to type 2 diabetes mellitus ([place]) 06/09/2017  . GERD without esophagitis 06/09/2017  . Glaucoma due to type 2 diabetes mellitus ([place]) 06/09/2017  . Alcohol abuse 06/04/2017  . Ischemic stroke ([place]) 06/02/2017  . Type 2 diabetes mellitus with vascular disease ([place]) 06/02/2017  . Chest pain 06/02/2017  . Closed non-physeal fracture of metatarsal bone of right foot  07/31/2016  . Hypertensive urgency 02/17/2014  . Chronic diastolic heart failure ([place]) 03/15/2013  . Closed left hip fracture ([place]) 07/17/2011    Past Surgical History:  Procedure Laterality Date  . ENUCLEATION Left   . FRACTURE SURGERY    . HIP ARTHROPLASTY  07/18/2011   Procedure: ARTHROPLASTY BIPOLAR HIP;  Surgeon: Nita Sells, MD;  Location: [facility];  Service: Orthopedics;  Laterality: Left;  . INTRAOCULAR PROSTHESES INSERTION  1999   left  . TONGUE BIOPSY  1998   "cancer"  . TUBAL LIGATION  1970's     OB History   No obstetric history on file.     Family History  Problem Relation Age of Onset  . Diabetes type II Mother   . Diabetes type II Father   . Diabetes type II Sister   . Colon cancer Neg Hx     Social History   Tobacco Use  . Smoking status: Former Smoker    Packs/day: 1.50    Years: 47.00    Pack years: 70.50    Types: Cigarettes    Quit date: 09/13/1997    Years since quitting: 21.3  . Smokeless tobacco: Never Used  Substance Use Topics  . Alcohol use: Yes    Alcohol/week: 17.0 standard drinks    Types: 17 Shots of liquor per week    Comment: 06/02/2017 "1/5 rum q weekend"  . Drug use: No    Types: Marijuana, Heroin    Comment: 06/02/2017  "last drug use was in the 1980s"    Home Medications Prior to Admission medications   Medication Sig Start Date End Date Taking? Authorizing Provider  acetaminophen (TYLENOL) 325 MG tablet Take 2 tablets (650 mg total) by mouth every 6 (six) hours as needed for mild pain or fever (or temp > 37.5 C (99.5 F)). 06/05/17   Elgergawy, Silver Huguenin, MD  ALPHAGAN P 0.1 % SOLN Place 1 drop into the right eye 2 (two) times daily. 10/10/15   [provider]  amLODipine (NORVASC) 10 MG tablet Take 10 mg by mouth daily.    [provider]  aspirin 81 MG tablet Take 81 mg by mouth daily.    [provider]  atorvastatin (LIPITOR) 40 MG tablet Take 1 tablet (40 mg total) by mouth daily at 6 PM.  11/26/18   Kathlen Mody, Nicki Reaper T, PA-C  carvedilol (COREG) 25 MG tablet Take 1 tablet (25 mg total) by mouth 2 (two) times daily with a meal. Take one daily if she does not feel better she will take another at about 2pm 11/03/18 12/06/18  Elodia Florence., MD  folic acid (FOLVITE) 1 MG tablet Take 1 tablet (1 mg total) by mouth daily. 06/06/17   Elgergawy, Silver Huguenin, MD  gabapentin (NEURONTIN) 100 MG capsule Take 100 mg by mouth 3 (three) times daily.    [provider]  isosorbide mononitrate (IMDUR) 60  MG 24 hr tablet Take 1.5 tablets (90 mg total) by mouth daily. 11/26/18 02/24/19  [name] T, PA-C  loratadine (CLARITIN) 10 MG tablet Take 10 mg by mouth daily. 01/20/18   [provider]  Multiple Vitamin (MULTIVITAMIN WITH MINERALS) TABS tablet Take 1 tablet by mouth daily. 06/06/17   Elgergawy, Silver Huguenin, MD  nitroGLYCERIN (NITROSTAT) 0.4 MG SL tablet Place 1 tablet (0.4 mg total) under the tongue every 5 (five) minutes as needed for chest pain. 11/26/18 02/24/19  [name] T, PA-C  omeprazole (PRILOSEC) 20 MG capsule Take 1 capsule (20 mg total) by mouth daily. 08/21/17   Belva Crome, MD  TRAVATAN Z 0.004 % SOLN ophthalmic solution Place 1 drop into the right eye at bedtime.  11/24/12   [provider]    Allergies    Patient has no known allergies.  Review of Systems   Review of Systems  Constitutional: Negative for activity change, chills, diaphoresis and fever.  HENT: Negative for congestion, sore throat and voice change.   Eyes: Negative for visual disturbance.  Respiratory: Positive for shortness of breath. Negative for wheezing.   Cardiovascular: Positive for chest pain. Negative for palpitations and leg swelling.  Gastrointestinal: Negative for abdominal pain, constipation, diarrhea, nausea and vomiting.  Genitourinary: Negative for dysuria.  Musculoskeletal: Positive for neck pain. Negative for back pain, gait problem, joint swelling, myalgias and  neck stiffness.  Skin: Negative for rash.  Allergic/Immunologic: Negative for immunocompromised state.  Neurological: Positive for headaches. Negative for dizziness, seizures, syncope, weakness, light-headedness and numbness.  Psychiatric/Behavioral: Negative for confusion.    Physical Exam Updated Vital Signs BP (!) 188/83   Pulse 65   Temp 97.8 F (36.6 C) (Oral)   Resp 13   SpO2 97%   Physical Exam Vitals and nursing note reviewed.  Constitutional:      General: She is not in acute distress.    Comments: Elderly. Well appearing. NAD.  HENT:     Head: Normocephalic.  Eyes:     Conjunctiva/sclera: Conjunctivae normal.  Cardiovascular:     Rate and Rhythm: Normal rate and regular rhythm.     Pulses: Normal pulses.     Heart sounds: Normal heart sounds. No murmur. No friction rub. No gallop.   Pulmonary:     Effort: Pulmonary effort is normal. No respiratory distress.     Breath sounds: No stridor. No wheezing, rhonchi or rales.  Chest:     Chest wall: No tenderness.  Abdominal:     General: There is no distension.     Palpations: Abdomen is soft. There is no mass.     Tenderness: There is no abdominal tenderness. There is no right CVA tenderness, left CVA tenderness, guarding or rebound.     Hernia: No hernia is present.  Musculoskeletal:     Cervical back: Neck supple.     Comments: No lower extremity edema  Skin:    General: Skin is warm.     Findings: No rash.  Neurological:     Mental Status: She is alert.  Psychiatric:        Behavior: Behavior normal.     ED Results / Procedures / Treatments   Labs (all labs ordered are listed, but only abnormal results are displayed) Labs Reviewed  BASIC METABOLIC PANEL - Abnormal; Notable for the following components:      Result Value   Sodium 127 (*)    Potassium 5.8 (*)    CO2 16 (*)  Glucose, Bld 149 (*)    BUN 35 (*)    Creatinine, Ser 2.49 (*)    GFR calc non Af Amer 17 (*)    GFR calc Af Amer 20 (*)     All other components within normal limits  CBC - Abnormal; Notable for the following components:   RBC 3.78 (*)    Hemoglobin 11.3 (*)    HCT 35.4 (*)    All other components within normal limits  BASIC METABOLIC PANEL - Abnormal; Notable for the following components:   Sodium 127 (*)    Potassium 5.2 (*)    CO2 20 (*)    BUN 35 (*)    Creatinine, Ser 2.19 (*)    GFR calc non Af Amer 20 (*)    GFR calc Af Amer 23 (*)    All other components within normal limits  BRAIN NATRIURETIC PEPTIDE  TROPONIN I (HIGH SENSITIVITY)    EKG EKG Interpretation  Date/Time:  Wednesday January 19 2019 14:23:41 EST Ventricular Rate:  66 PR Interval:  164 QRS Duration: 84 QT Interval:  398 QTC Calculation: 417 R Axis:   -6 Text Interpretation: Normal sinus rhythm Moderate voltage criteria for LVH, may be normal variant ( R in aVL , Cornell product ) T wave abnormality, consider inferior ischemia Abnormal ECG No significant change since last tracing Confirmed by Pryor Curia [phone]) on 01/20/2019 12:16:12 AM   Radiology DG Chest 2 View  Result Date: 01/20/2019 CLINICAL DATA:  Scans of chest pain and shortness of breath EXAM: CHEST - 2 VIEW COMPARISON:  Radiograph 11/01/2018 FINDINGS: Low lung volumes with some atelectatic changes. No focal consolidative process, convincing features of edema, pneumothorax or visible effusion. The aorta is calcified. Prominence of the cardiac silhouette may reflect mild cardiomegaly similar to prior. Degenerative changes are present in the imaged spine and shoulders. No acute osseous or soft tissue abnormality. IMPRESSION: Low lung volumes with atelectasis. No other acute cardiopulmonary abnormality. Stable cardiomegaly. Electronically Signed   By: Lovena Le M.D.   On: 01/20/2019 01:28   CT Head Wo Contrast  Result Date: 01/20/2019 CLINICAL DATA:  Headache, acute, normal neuro exam EXAM: CT HEAD WITHOUT CONTRAST TECHNIQUE: Contiguous axial images were obtained from  the base of the skull through the vertex without intravenous contrast. COMPARISON:  MRI Jun 02, 2017, CT October 12, 2009 FINDINGS: Brain: Regions of gliosis in the bilateral basal ganglia, left pons and right cerebellum have an appearance compatible with remote lacunar infarct. Findings were present on comparison MRI. No evidence of acute infarction, hemorrhage, hydrocephalus, extra-axial collection or mass lesion/mass effect. Symmetric prominence of the ventricles, cisterns and sulci compatible with advanced parenchymal volume loss. Confluent areas of white matter hypoattenuation are most compatible with moderate chronic microvascular angiopathy. Extensive dural calcifications are similar to comparison studies. Vascular: Atherosclerotic calcification of the carotid siphons and intradural vertebral arteries. No hyperdense vessel. Skull: Bones are diffusely demineralized. Mild hyperostosis frontalis interna, a benign incidental finding. No calvarial fracture or suspicious osseous lesion. No scalp swelling or hematoma. 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 Right orbit is unremarkable. Paranasal sinuses and mastoid air cells are predominantly clear. Other: Debris in the left external auditory canal. IMPRESSION: 1. No acute intracranial abnormality. 2. Advanced parenchymal volume loss and chronic microvascular angiopathy. 3. Remote lacunar infarcts in the bilateral basal ganglia, left pons and right cerebellum. 4. Left orbital prosthesis. 5. Debris in the left external auditory canal, correlate for cerumen. Electronically  Signed   By: Lovena Le M.D.   On: 01/20/2019 01:32    Procedures Procedures (including critical care time)  Medications Ordered in ED Medications  sodium chloride 0.9 % bolus 500 mL (0 mLs Intravenous Stopped 01/20/19 0421)  acetaminophen (TYLENOL) tablet 1,000 mg (1,000 mg Oral Given 01/20/19 0314)  carvedilol (COREG) tablet 25  mg (25 mg Oral Given 01/20/19 0533)    ED Course  I have reviewed the triage vital signs and the nursing notes.  Pertinent labs & imaging results that were available during my care of the patient were reviewed by me and considered in my medical decision making (see chart for details).  Clinical Course as of Jan 20 648  Thu Jan 20, 2019  0537 Patient recheck.  Headache is improving.  Chest pain and neck pain have resolved.  We will give the patient her home dose of Coreg as she is hypertensive.  Awaiting repeat BMP.   [MM]    Clinical Course User Index [MM] Jae Bruck, Laymond Purser, PA-C   MDM Rules/Calculators/A&P                      86 year old female with a history of ischemic stroke, left prosthetic eye globe, tongue cancer s/p radiation, DM Type II, HTN, alcohol abuse, hyperlipidemia presenting with a chief complaint of a headache.  The patient states that the headache radiates down her bilateral neck and is causing chest pressure.  No neurologic deficits.  No infectious symptoms.  Patient's medical record has been reviewed, including her most recent cardiology notes.  The patient was seen and independently evaluated by Dr. Leonides Schanz, attending physician.  Chest pain is very atypical as her primary concern is her headache.  Does not seem worse with exertion. She is hyperkalemic at 5.8.  EKG without peaked T waves and no significant change from prior EKG.  She is also hyponatremic at 127.  Bicarb is also decreased to 16, which is consistent with previous lab work that she had in December.  No leukocytosis.  Troponin is normal.  Chest x-ray with low lung volumes with atelectasis and stable cardiomegaly.  CT head is unremarkable.  We will treat the patient with 500 cc fluid bolus for hyponatremia and hyperkalemia today given her history of chronic diastolic heart failure and reassess.  Suspect hyperkalemia is secondary to chronic kidney disease.  If electrolytes improve, she can be discharged to home  with cardiology follow-up.  On reevaluation, patient is asymptomatic and is feeling much better.  She was given her home dose of Coreg, which she missed yesterday's that she was in the ER.  Patient care transferred to PA Caccavale at the end of my shift. Patient presentation, ED course, and plan of care discussed with review of all pertinent labs and imaging. Please see his/her note for further details regarding further ED course and disposition.   Final Clinical Impression(s) / ED Diagnoses Final diagnoses:  Generalized headache  Atypical chest pain  Hyperkalemia    Rx / DC Orders ED Discharge Orders    None       Lyna Laningham A, PA-C 01/20/19 [place], Delice Bison, DO 01/20/19 [phone]

## 2019-01-20 ENCOUNTER — Emergency Department (HOSPITAL_COMMUNITY): Payer: Medicare Other

## 2019-01-20 ENCOUNTER — Encounter (HOSPITAL_COMMUNITY): Payer: Self-pay

## 2019-01-20 DIAGNOSIS — R519 Headache, unspecified: Secondary | ICD-10-CM | POA: Diagnosis not present

## 2019-01-20 LAB — BASIC METABOLIC PANEL
Anion gap: 8 (ref 5–15)
BUN: 35 mg/dL — ABNORMAL HIGH (ref 8–23)
CO2: 20 mmol/L — ABNORMAL LOW (ref 22–32)
Calcium: 9 mg/dL (ref 8.9–10.3)
Chloride: 99 mmol/L (ref 98–111)
Creatinine, Ser: 2.19 mg/dL — ABNORMAL HIGH (ref 0.44–1.00)
GFR calc Af Amer: 23 mL/min — ABNORMAL LOW (ref >60)
GFR calc non Af Amer: 20 mL/min — ABNORMAL LOW (ref >60)
Glucose, Bld: 79 mg/dL (ref 70–99)
Potassium: 5.2 mmol/L — ABNORMAL HIGH (ref 3.5–5.1)
Sodium: 127 mmol/L — ABNORMAL LOW (ref 135–145)

## 2019-01-20 LAB — BRAIN NATRIURETIC PEPTIDE: B Natriuretic Peptide: 84.3 pg/mL (ref 0.0–100.0)

## 2019-01-20 LAB — TROPONIN I (HIGH SENSITIVITY): Troponin I (High Sensitivity): 12 ng/L (ref <18)

## 2019-01-20 MED ORDER — ACETAMINOPHEN 500 MG PO TABS
1000.0000 mg | ORAL_TABLET | Freq: Once | ORAL | Status: AC
  Administered 2019-01-20: 1000 mg via ORAL
  Filled 2019-01-20: qty 2

## 2019-01-20 MED ORDER — CARVEDILOL 12.5 MG PO TABS
25.0000 mg | ORAL_TABLET | Freq: Once | ORAL | Status: AC
  Administered 2019-01-20: 25 mg via ORAL
  Filled 2019-01-20: qty 2

## 2019-01-20 MED ORDER — SODIUM CHLORIDE 0.9 % IV BOLUS
500.0000 mL | Freq: Once | INTRAVENOUS | Status: AC
  Administered 2019-01-20: 500 mL via INTRAVENOUS

## 2019-01-20 NOTE — ED Provider Notes (Signed)
Pt signed out to me by Donavan Foil, PA-C. Please see previous notes for further history.   In brief, pt presenting for evaluation of bilateral frontal HA radiating down bilateral neck and into her chest. She has had similar pains this week. She has a cardiologist, recently seen for angina and tx medically. Labs show mild hyponatremia and hyperkalemia. She has received fluids and tylneol. She is CP free and HA is resolved. BMP pending.   Repeat BMP shows mild hyponatremia. Hyperkalemia improved. At baseline. Will plan for d/c with close f/u with cardiology and PCP.   Discussed plan with pt. At this time, pt appears safe for d/c. Return precautions given. Pt states she understands and agrees to plan.    Franchot Heidelberg, PA-C 01/20/19 [id number]    Virgel Manifold, MD 01/21/19 1008

## 2019-01-20 NOTE — ED Provider Notes (Signed)
Medical screening examination/treatment/procedure(s) were conducted as a shared visit with non-physician practitioner(s) and myself.  I personally evaluated the patient during the encounter.  EKG Interpretation  Date/Time:  Wednesday January 19 2019 14:23:41 EST Ventricular Rate:  66 PR Interval:  164 QRS Duration: 84 QT Interval:  398 QTC Calculation: 417 R Axis:   -6 Text Interpretation: Normal sinus rhythm Moderate voltage criteria for LVH, may be normal variant ( R in aVL , Cornell product ) T wave abnormality, consider inferior ischemia Abnormal ECG No significant change since last tracing Confirmed by Pryor Curia [id number]) on 01/20/2019 12:16:12 AM   Patient is 86 year old female who presents to the emergency department with headache that radiates down her neck into her chest.  No focal neurologic deficits.  Symptoms seem very atypical for ACS.  Troponin negative.  EKG shows no ischemic change.  Doubt PE or dissection.  BNP normal and no sign of volume overload on exam.  BMP did show potassium of 5.8 and she has chronic kidney disease.  Hydrated gently and given Tylenol.  Headache improved.  No EKG changes suggestive of hyperkalemia.  Repeat potassium level pending and signed out the oncoming ED providers.   Omya Winfield, Delice Bison, DO 01/20/19 [phone]

## 2019-01-20 NOTE — Discharge Instructions (Addendum)
Continue taking home medications as prescribed.  Follow up with your cardiologist for further evaluation of your chest pain.  Follow up with your primary care doctor as needed for recheck of labs (if not done with your heart doctor).  Return to the ER with any new, worsening, or concerning symptoms.

## 2019-01-26 ENCOUNTER — Other Ambulatory Visit: Payer: Self-pay

## 2019-01-26 ENCOUNTER — Ambulatory Visit (INDEPENDENT_AMBULATORY_CARE_PROVIDER_SITE_OTHER): Payer: Medicare Other | Admitting: Cardiology

## 2019-01-26 ENCOUNTER — Encounter: Payer: Self-pay | Admitting: Cardiology

## 2019-01-26 VITALS — BP 160/90 | HR 87 | Ht 60.0 in | Wt 144.0 lb

## 2019-01-26 DIAGNOSIS — I129 Hypertensive chronic kidney disease with stage 1 through stage 4 chronic kidney disease, or unspecified chronic kidney disease: Secondary | ICD-10-CM

## 2019-01-26 DIAGNOSIS — R079 Chest pain, unspecified: Secondary | ICD-10-CM | POA: Diagnosis not present

## 2019-01-26 DIAGNOSIS — N184 Chronic kidney disease, stage 4 (severe): Secondary | ICD-10-CM

## 2019-01-26 DIAGNOSIS — N183 Chronic kidney disease, stage 3 unspecified: Secondary | ICD-10-CM

## 2019-01-26 DIAGNOSIS — E785 Hyperlipidemia, unspecified: Secondary | ICD-10-CM

## 2019-01-26 DIAGNOSIS — E1122 Type 2 diabetes mellitus with diabetic chronic kidney disease: Secondary | ICD-10-CM | POA: Diagnosis not present

## 2019-01-26 DIAGNOSIS — I5032 Chronic diastolic (congestive) heart failure: Secondary | ICD-10-CM | POA: Diagnosis not present

## 2019-01-26 DIAGNOSIS — E875 Hyperkalemia: Secondary | ICD-10-CM

## 2019-01-26 MED ORDER — HYDRALAZINE HCL 10 MG PO TABS: 10.0000 mg | ORAL_TABLET | Freq: Three times a day (TID) | ORAL | 1 refills | Status: DC

## 2019-01-26 MED ORDER — ISOSORBIDE MONONITRATE ER 60 MG PO TB24: 60.0000 mg | ORAL_TABLET | Freq: Two times a day (BID) | ORAL | 3 refills | Status: DC

## 2019-01-26 NOTE — Progress Notes (Signed)
Cardiology Office Note   Date:  01/26/2019   ID:  [name], DOB June 28, 1932, MRN [id number]  PCP:  Dierdre Harness, FNP  Cardiologist:  Dr. Tamala Julian    No chief complaint on file.     History of Present Illness: [name] is a 86 y.o. female who presents for hospital follow up. Had headache and seen 01/19/18    Seen 12/06/18 with angina. Chronic Diastolic CHF  Chest pain  ? Myoview 5/19: small inf basal ischemia, low risk >> Med Rx. ? Avoiding cath due to chronic kidney disease  ? 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 EF was normal on Echocardiogram. ADDimer was elevated and VQ scan was low probability and venous US was neg for DVT. Hs-Troponin was mildly elevated without clear trend. She was started on Lasix to cover for possible volume overload. Outpatient workup for COPD was recommended.   Patient was last seen in clinic by Sande Rives, PA-C 11/17/2018. Creatinine was worse on labs and Dr. Tamala Julian recommended stopping Lasix and increasing Imdur. A Myoview had been ordered but Dr. Tamala Julian recommended canceling this. Cardiac catheterization is being avoided due to poor renal function.   She returns for follow-up. She has not had any change in her breathing. She has not had any leg swelling, paroxysmal nocturnal dyspnea. She sleeps on 2 pillows chronically. She does note exertional chest tightness. This is unchanged. She does not have nitroglycerin at home. She has not had syncope.  Imdur increased to 90 mg may need ranolazine but with Cr elevation am hesitant to begin.   Lipitor given  Dr. Tamala Julian had cancelled her stress test.  Trying not to have cardiac cath due to renal function.   Cr 1.71 na  128 K+ 5.3 on last visit.   Visit 12/06/18  she tells me she continues with chest pain.  The increase of imdur has helped.  She continues to take NTG sl with improvement.  Her BP is still elevated -her greatest complaint is chest pain though improving.    Went to ER 01/20/19 with jaw neck and chest pain. BP elevated Cr 2.19 in ER and BNP 84 hs trop of 12  hgb stable  CXR with no cardiopulmonary abnormality.   Today after ER visit.  She is still having jaw, neck and chest pain 3 times per day, she rests for 30 mins and it resolves. She has not tried sl NTG "it is always in the other room"  We discussed importance of keeping near.  She takes imdur 60 in am and 30 in pm.  Her Cr is still elevated, she was seeing nephrology Dr. Florene Glen but no one recently.  Concern with ranexa with her Cr.  Discussed cath may cause her to have dialysis.       Past Medical History:  Diagnosis Date  . Anemia 04/03/2011  . Anginal pain ([place])   . Anxiety   . Arthritis    "feet" (06/02/2017)  . Blind left eye 1999  . Depression   . Exertional dyspnea   . GERD (gastroesophageal reflux disease)   . High cholesterol   . Hip fracture ([place]) 07/17/11   fall from 1-2 feet; left  . Hypertension   .  Ischemic stroke ([place]) 06/02/2017   "weak moving around" (06/02/2017)  . Prosthetic eye globe 1999   "from diabetic; left eye"  . Type II diabetes mellitus ([place])    not taking any medication (06/02/2017)    Past Surgical History:  Procedure Laterality Date  . ENUCLEATION Left   . FRACTURE SURGERY    . HIP ARTHROPLASTY  07/18/2011   Procedure: ARTHROPLASTY BIPOLAR HIP;  Surgeon: Nita Sells, MD;  Location: [facility];  Service: Orthopedics;  Laterality: Left;  . INTRAOCULAR PROSTHESES INSERTION  1999   left  . TONGUE BIOPSY  1998   "cancer"  . TUBAL LIGATION  1970's     Current Outpatient Medications  Medication Sig Dispense Refill  . acetaminophen (TYLENOL) 325 MG tablet Take 2 tablets (650 mg total) by mouth  every 6 (six) hours as needed for mild pain or fever (or temp > 37.5 C (99.5 F)).    . ALPHAGAN P 0.1 % SOLN Place 1 drop into the right eye 2 (two) times daily.    Marland Kitchen amLODipine (NORVASC) 10 MG tablet Take 10 mg by mouth daily.    Marland Kitchen aspirin 81 MG tablet Take 81 mg by mouth daily.    Marland Kitchen atorvastatin (LIPITOR) 40 MG tablet Take 1 tablet (40 mg total) by mouth daily at 6 PM. 90 tablet 3  . carvedilol (COREG) 25 MG tablet Take 1 tablet (25 mg total) by mouth 2 (two) times daily with a meal. Take one daily if she does not feel better she will take another at about 2pm 60 tablet 0  . folic acid (FOLVITE) 1 MG tablet Take 1 tablet (1 mg total) by mouth daily.    Marland Kitchen gabapentin (NEURONTIN) 100 MG capsule Take 100 mg by mouth 3 (three) times daily.    . isosorbide mononitrate (IMDUR) 60 MG 24 hr tablet Take 1.5 tablets (90 mg total) by mouth daily. 120 tablet 1  . loratadine (CLARITIN) 10 MG tablet Take 10 mg by mouth daily.    . Multiple Vitamin (MULTIVITAMIN WITH MINERALS) TABS tablet Take 1 tablet by mouth daily.    . nitroGLYCERIN (NITROSTAT) 0.4 MG SL tablet Place 1 tablet (0.4 mg total) under the tongue every 5 (five) minutes as needed for chest pain. 25 tablet 3  . omeprazole (PRILOSEC) 20 MG capsule Take 1 capsule (20 mg total) by mouth daily. 30 capsule 9  . TRAVATAN Z 0.004 % SOLN ophthalmic solution Place 1 drop into the right eye at bedtime.      No current facility-administered medications for this visit.    Allergies:   Patient has no known allergies.    Social History:  The patient  reports that she quit smoking about 21 years ago. Her smoking use included cigarettes. She has a 70.50 pack-year smoking history. She has never used smokeless tobacco. She reports current alcohol use of about 17.0 standard drinks of alcohol per week. She reports that she does not use drugs.   Family History:  The patient's family history includes Diabetes type II in her father, mother, and sister.    ROS:   General:no colds or fevers, + weight decrease Skin:no rashes or ulcers HEENT:no blurred vision, no congestion, blind Lt eye with prosthetic eye  CV:see HPI PUL:see HPI GI:no diarrhea constipation or melena, no indigestion GU:no hematuria, no dysuria MS:no joint pain, no claudication Neuro:no syncope, no lightheadedness Endo + diabetes, no thyroid disease  Wt Readings from Last 3 Encounters:  01/26/19 144 lb (65.3 kg)  12/20/18 146 lb 12.8 oz (66.6 kg)  11/26/18 153 lb 6.4 oz (69.6 kg)     PHYSICAL EXAM: VS:  BP (!) 160/90   Pulse 87   Ht 5' (1.524 m)   Wt 144 lb (65.3 kg)   BMI 28.12 kg/m  , BMI Body mass index is 28.12 kg/m. General:Pleasant affect, NAD Skin:Warm and dry, brisk capillary refill HEENT:normocephalic, sclera clear, mucus membranes moist Neck:supple, no JVD, no bruits  Heart:S1S2 RRR without murmur, gallup, rub or click Lungs:clear without rales, rhonchi, or wheezes IHK:VQQV, non tender, + BS, do not palpate liver spleen or masses Ext:no lower ext edema, 2+ pedal pulses, 2+ radial pulses Neuro:alert and oriented, MAE, follows commands, + facial symmetry    EKG:  EKG is ordered today. The ekg ordered today demonstrates SR at 87 no acute ST changes, LVH, T wave inversions improved    Recent Labs: 02/21/2018: ALT 26 11/10/2018: NT-Pro BNP 924 01/19/2019: Hemoglobin 11.3; Platelets 216 01/20/2019: B Natriuretic Peptide 84.3; BUN 35; Creatinine, Ser 2.19; Potassium 5.2; Sodium 127    Lipid Panel    Component Value Date/Time   CHOL 141 11/02/2018 0358   TRIG 148 11/02/2018 0358   HDL 40 (L) 11/02/2018 0358   CHOLHDL 3.5 11/02/2018 0358   VLDL 30 11/02/2018 0358   LDLCALC 71 11/02/2018 0358       Other studies Reviewed: Additional studies/ records that were reviewed today include: . Echo 11/02/18  IMPRESSIONS   1. Left ventricular ejection fraction, by visual estimation, is 60 to 65%. The left ventricle has normal function. Normal left  ventricular size. Left ventricular septal wall thickness was mildly increased. Mildly increased left ventricular posterior  wall thickness. 2. Left ventricular diastolic Doppler parameters are consistent with impaired relaxation pattern of LV diastolic filling. 3. Elevated left ventricular end-diastolic pressure. 4. Global right ventricle has normal systolic function.The right ventricular size is normal. No increase in right ventricular wall thickness. 5. Left atrial size was moderately dilated. 6. Right atrial size was normal. 7. Moderate mitral annular calcification. 8. The mitral valve is normal in structure. Trace mitral valve regurgitation. No evidence of mitral stenosis. 9. The tricuspid valve is normal in structure. Tricuspid valve regurgitation is mild. 10. The aortic valve is tricuspid. Aortic valve regurgitation is trivial by color flow Doppler. Moderate aortic valve sclerosis/calcification without any evidence of aortic stenosis. 11. The pulmonic valve was normal in structure. Pulmonic valve regurgitation is mild by color flow Doppler. 12. Normal pulmonary artery systolic pressure. 13. The inferior vena cava is normal in size with greater than 50% respiratory variability, suggesting right atrial pressure of 3 mmHg.  FINDINGS Left Ventricle: Left ventricular ejection fraction, by visual estimation, is 60 to 65%. The left ventricle has normal function. Left ventricular septal wall thickness was mildly increased. Mildly increased left ventricular posterior wall thickness.  There is no left ventricular hypertrophy. Normal left ventricular size. Spectral Doppler shows Left ventricular diastolic Doppler parameters are consistent with impaired relaxation pattern of LV diastolic filling. Elevated left ventricular end-diastolic  pressure.  Right Ventricle: The right ventricular size is normal. No increase in right ventricular wall thickness. Global RV systolic function is has normal  systolic function. The tricuspid regurgitant velocity is 2.55 m/s, and with an assumed right atrial pressure of 3 mmHg, the estimated right ventricular systolic pressure is normal at 29.0 mmHg.  Left Atrium: Left atrial size was moderately dilated.  Right Atrium: Right atrial size was normal in size  Pericardium: There is no  evidence of pericardial effusion.  Mitral Valve: The mitral valve is normal in structure. Moderate mitral annular calcification. No evidence of mitral valve stenosis by observation. Trace mitral valve regurgitation.  Tricuspid Valve: The tricuspid valve is normal in structure. Tricuspid valve regurgitation is mild by color flow Doppler.  Aortic Valve: The aortic valve is tricuspid. Aortic valve regurgitation is trivial by color flow Doppler. Aortic regurgitation PHT measures 652 msec. Moderate aortic valve sclerosis/calcification is present, without any evidence of aortic stenosis.  Pulmonic Valve: The pulmonic valve was normal in structure. Pulmonic valve regurgitation is mild by color flow Doppler.  Aorta: The aortic root, ascending aorta and aortic arch are all structurally normal, with no evidence of dilitation or obstruction.  Venous: The inferior vena cava is normal in size with greater than 50% respiratory variability, suggesting right atrial pressure of 3 mmHg.  IAS/Shunts: No atrial level shunt detected by color flow Doppler. No ventricular septal defect is seen or detected. There is no evidence of an atrial septal defect.   ASSESSMENT AND PLAN:  1.  Ongoing jaw neck and chest pain.  Has about 3 times per day and associated with SOB.  Rests for 30 min and it resolves.  Will increase imdur to 60 BID and see back in 7-10 days to re-eval.  Discussed keeping NTG sl   2.  HTN uncontrolled will increase Imdur and ad hydralazine 10 TID  Will re-eval on next visit.  3.  Chronic diastolic HF  Will check BMP and no SOB now.  4.  CKD 4, with elevated K+  not on ace or arb or K+ will refer to send back to nephrology   5.  HLD continue statin  Current medicines are reviewed with the patient today.  The patient Has no concerns regarding medicines.  The following changes have been made:  See above Labs/ tests ordered today include:see above  Disposition:   FU:  see above  Signed, Cecilie Kicks, NP  01/26/2019 2:28 PM    [facility] [address], [place], [place], [place] Phone: [phone]; Fax: [phone]

## 2019-01-26 NOTE — Patient Instructions (Addendum)
Medication Instructions:     1. START TAKING ISOSORBIDE 30 MG IN AM AND 30 MG IN THE PM    2. START TAKING HYDRALAZINE 10 MG 3 TIMES A DAY   *If you need a refill on your cardiac medications before your next appointment, please call your pharmacy*  Lab Work:  BMET TODAY   If you have labs (blood work) drawn today and your tests are completely normal, you will receive your results only by: Marland Kitchen MyChart Message (if you have MyChart) OR . A paper copy in the mail If you have any lab test that is abnormal or we need to change your treatment, we will call you to review the results.  Testing/Procedures:  NONE ORDERED  TODAY  Follow-Up: At [facility], you and your health needs are our priority.  As part of our continuing mission to provide you with exceptional heart care, we have created designated Provider Care Teams.  These Care Teams include your primary Cardiologist (physician) and Advanced Practice Providers (APPs -  Physician Assistants and Nurse Practitioners) who all work together to provide you with the care you need, when you need it.  Your next appointment:   7- 10  day(s)  The format for your next appointment:   In Person  Provider:   You may see Sinclair Grooms, MD or one of the following Advanced Practice Providers on your designated Care Team:    Truitt Merle, NP  Cecilie Kicks, NP  Kathyrn Drown, NP  YOU ALSO NEED TO FOLLOW UP WITH [facility]  A REFERRAL REQUEST HAS BEEN SENT IN FOR YOU.( SOMEONE WILL CONTACT YOU WITH FURTHER STEPS)  Other Instructions

## 2019-01-27 LAB — BASIC METABOLIC PANEL
BUN/Creatinine Ratio: 20 (ref 12–28)
BUN: 44 mg/dL — ABNORMAL HIGH (ref 8–27)
CO2: 20 mmol/L (ref 20–29)
Calcium: 9.9 mg/dL (ref 8.7–10.3)
Chloride: 98 mmol/L (ref 96–106)
Creatinine, Ser: 2.18 mg/dL — ABNORMAL HIGH (ref 0.57–1.00)
GFR calc Af Amer: 23 mL/min/{1.73_m2} — ABNORMAL LOW (ref >59)
GFR calc non Af Amer: 20 mL/min/{1.73_m2} — ABNORMAL LOW (ref >59)
Glucose: 83 mg/dL (ref 65–99)
Potassium: 5.5 mmol/L — ABNORMAL HIGH (ref 3.5–5.2)
Sodium: 132 mmol/L — ABNORMAL LOW (ref 134–144)

## 2019-02-11 ENCOUNTER — Other Ambulatory Visit: Payer: Self-pay

## 2019-02-11 ENCOUNTER — Ambulatory Visit (INDEPENDENT_AMBULATORY_CARE_PROVIDER_SITE_OTHER): Payer: Medicare Other | Admitting: Cardiology

## 2019-02-11 ENCOUNTER — Encounter: Payer: Self-pay | Admitting: Cardiology

## 2019-02-11 VITALS — BP 170/102 | HR 89 | Ht 60.0 in | Wt 146.8 lb

## 2019-02-11 DIAGNOSIS — E785 Hyperlipidemia, unspecified: Secondary | ICD-10-CM

## 2019-02-11 DIAGNOSIS — E1122 Type 2 diabetes mellitus with diabetic chronic kidney disease: Secondary | ICD-10-CM | POA: Diagnosis not present

## 2019-02-11 DIAGNOSIS — I209 Angina pectoris, unspecified: Secondary | ICD-10-CM | POA: Diagnosis not present

## 2019-02-11 DIAGNOSIS — N183 Chronic kidney disease, stage 3 unspecified: Secondary | ICD-10-CM

## 2019-02-11 DIAGNOSIS — I5032 Chronic diastolic (congestive) heart failure: Secondary | ICD-10-CM | POA: Diagnosis not present

## 2019-02-11 DIAGNOSIS — N184 Chronic kidney disease, stage 4 (severe): Secondary | ICD-10-CM | POA: Diagnosis not present

## 2019-02-11 DIAGNOSIS — I129 Hypertensive chronic kidney disease with stage 1 through stage 4 chronic kidney disease, or unspecified chronic kidney disease: Secondary | ICD-10-CM

## 2019-02-11 MED ORDER — ISOSORBIDE MONONITRATE ER 60 MG PO TB24: ORAL_TABLET | ORAL | 3 refills | Status: DC

## 2019-02-11 MED ORDER — OMEPRAZOLE 20 MG PO CPDR: 20.0000 mg | DELAYED_RELEASE_CAPSULE | Freq: Every day | ORAL | 9 refills | Status: DC

## 2019-02-11 MED ORDER — HYDRALAZINE HCL 10 MG PO TABS: 20.0000 mg | ORAL_TABLET | Freq: Three times a day (TID) | ORAL | 2 refills | Status: DC

## 2019-02-11 MED ORDER — ATORVASTATIN CALCIUM 40 MG PO TABS: 40.0000 mg | ORAL_TABLET | Freq: Every day | ORAL | 3 refills | Status: DC

## 2019-02-11 MED ORDER — NITROGLYCERIN 0.4 MG SL SUBL: 0.4000 mg | SUBLINGUAL_TABLET | SUBLINGUAL | 3 refills | Status: DC | PRN

## 2019-02-11 NOTE — Progress Notes (Signed)
Cardiology Office Note   Date:  02/13/2019   ID:  [name], DOB 12-27-1932, MRN [id number]  PCP:  Dierdre Harness, FNP  Cardiologist:  Dr. Tamala Julian     Chief Complaint  Patient presents with  . Chest Pain  . Hypertension      History of Present Illness: [name] is a 86 y.o. female who presents for follow up of angina.   Chronic Diastolic CHF  Chest pain  ? Myoview 5/19: small inf basal ischemia, low risk >> Med Rx. ? Avoiding cath due to chronic kidney disease  ? 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 EF was normal on Echocardiogram. ADDimer was elevated and VQ scan was low probability and venous US was neg for DVT. Hs-Troponin was mildly elevated without clear trend. She was started on Lasix to cover for possible volume overload. Outpatient workup for COPD was recommended.   Patient was last seen in clinic by Sande Rives, PA-C 11/17/2018. Creatinine was worse on labs and Dr. Tamala Julian recommended stopping Lasix and increasing Imdur. A Myoview had been ordered but Dr. Tamala Julian recommended canceling this. Cardiac catheterization is being avoided due to poor renal function.   She returns for follow-up. She has not had any change in her breathing. She has not had any leg swelling, paroxysmal nocturnal dyspnea. She sleeps on 2 pillows chronically. She does note exertional chest tightness. This is unchanged. She does not have nitroglycerin at home. She has not had syncope.  Imdur increased to 90 mg may need ranolazine  Lipitor givne  Dr. Tamala Julian had cancelled her stress test.  Trying not to have cardiac cath due to renal function.   Cr 1.71 na 128 K+ 5.3 on last visit.   Last visit 12/06/18  meds  increased.  If no improvement with all meds then caht.   she tells me she continues with chest pain.  The increase of imdur has helped.  She continues to take NTG sl with improvement.  Her BP is still elevated -her greatest complaint is chest pain though improving.  increased imdur  On last visit.   Today BP still elevated, she takes bus to office and walks from bus stop.  Continues with chest pain, may have improved with increase of Imdur and adding hydralazine.  She has taken NTG and pain resolves with 1 SL NTG.  She has seen nephrology and told continue what she is doing.     Past Medical History:  Diagnosis Date  . Anemia 04/03/2011  . Anginal pain ([place])   . Anxiety   . Arthritis    "feet" (06/02/2017)  . Blind left eye 1999  . Depression   . Exertional dyspnea   . GERD (gastroesophageal reflux disease)   . High cholesterol   . Hip fracture ([place]) 07/17/11   fall from 1-2 feet; left  . Hypertension   . Ischemic stroke ([place]) 06/02/2017   "weak moving around" (06/02/2017)  . Prosthetic eye globe 1999   "from diabetic; left eye"  . Type II diabetes mellitus ([place])    not taking any medication (06/02/2017)    Past Surgical History:  Procedure Laterality Date  . ENUCLEATION Left   . FRACTURE SURGERY    .  HIP ARTHROPLASTY  07/18/2011   Procedure: ARTHROPLASTY BIPOLAR HIP;  Surgeon: Nita Sells, MD;  Location: [facility];  Service: Orthopedics;  Laterality: Left;  . INTRAOCULAR PROSTHESES INSERTION  1999   left  . TONGUE BIOPSY  1998   "cancer"  . TUBAL LIGATION  1970's     Current Outpatient Medications  Medication Sig Dispense Refill  . acetaminophen (TYLENOL) 325 MG tablet Take 2 tablets (650 mg total) by mouth every 6 (six) hours as needed for mild pain or fever (or temp > 37.5 C (99.5 F)).    . ALPHAGAN P 0.1 % SOLN Place 1 drop into the right eye 2 (two) times daily.    Marland Kitchen amLODipine (NORVASC) 10 MG tablet Take 10 mg by mouth daily.    Marland Kitchen aspirin 81 MG tablet Take 81 mg  by mouth daily.    Marland Kitchen atorvastatin (LIPITOR) 40 MG tablet Take 1 tablet (40 mg total) by mouth daily at 6 PM. 90 tablet 3  . carvedilol (COREG) 25 MG tablet Take 1 tablet (25 mg total) by mouth 2 (two) times daily with a meal. Take one daily if she does not feel better she will take another at about 2pm 60 tablet 0  . folic acid (FOLVITE) 1 MG tablet Take 1 tablet (1 mg total) by mouth daily.    Marland Kitchen gabapentin (NEURONTIN) 100 MG capsule Take 100 mg by mouth 3 (three) times daily.    Marland Kitchen loratadine (CLARITIN) 10 MG tablet Take 10 mg by mouth daily.    . Multiple Vitamin (MULTIVITAMIN WITH MINERALS) TABS tablet Take 1 tablet by mouth daily.    . nitroGLYCERIN (NITROSTAT) 0.4 MG SL tablet Place 1 tablet (0.4 mg total) under the tongue every 5 (five) minutes as needed for chest pain. 25 tablet 3  . omeprazole (PRILOSEC) 20 MG capsule Take 1 capsule (20 mg total) by mouth daily. 30 capsule 9  . TRAVATAN Z 0.004 % SOLN ophthalmic solution Place 1 drop into the right eye at bedtime.     . cephALEXin (KEFLEX) 250 MG capsule     . hydrALAZINE (APRESOLINE) 10 MG tablet Take 2 tablets (20 mg total) by mouth 3 (three) times daily. 180 tablet 2  . isosorbide mononitrate (IMDUR) 60 MG 24 hr tablet Take 2 tablets by mouth in the a.m and take 1 tablet by mouth at bedtime 90 tablet 3   No current facility-administered medications for this visit.    Allergies:   Patient has no known allergies.    Social History:  The patient  reports that she quit smoking about 21 years ago. Her smoking use included cigarettes. She has a 70.50 pack-year smoking history. She has never used smokeless tobacco. She reports current alcohol use of about 17.0 standard drinks of alcohol per week. She reports that she does not use drugs.   Family History:  The patient's family history includes Diabetes type II in her father, mother, and sister.    ROS:  General:no colds or fevers, no weight changes Skin:no rashes or ulcers HEENT:no  blurred vision, no congestion CV:see HPI PUL:see HPI GI:no diarrhea constipation or melena, no indigestion GU:no hematuria, no dysuria MS:no joint pain, no claudication Neuro:no syncope, no lightheadedness Endo:no diabetes, no thyroid disease  Wt Readings from Last 3 Encounters:  02/11/19 146 lb 12.8 oz (66.6 kg)  01/26/19 144 lb (65.3 kg)  12/20/18 146 lb 12.8 oz (66.6 kg)     PHYSICAL EXAM: VS:  BP (!) 170/102  Pulse 89   Ht 5' (1.524 m)   Wt 146 lb 12.8 oz (66.6 kg)   SpO2 96%   BMI 28.67 kg/m  , BMI Body mass index is 28.67 kg/m. General:Pleasant affect, NAD Skin:Warm and dry, brisk capillary refill HEENT:normocephalic, sclera clear, mucus membranes moist Neck:supple, no JVD, no bruits  Heart:S1S2 RRR without murmur, gallup, rub or click Lungs:clear without rales, rhonchi, or wheezes TGY:BWLS, non tender, + BS, do not palpate liver spleen or masses Ext:no lower ext edema, 2+ pedal pulses, 2+ radial pulses Neuro:alert and oriented X 3, MAE, follows commands, + facial symmetry    EKG:  EKG is NOT  ordered today.   Recent Labs: 02/21/2018: ALT 26 11/10/2018: NT-Pro BNP 924 01/19/2019: Hemoglobin 11.3; Platelets 216 01/20/2019: B Natriuretic Peptide 84.3 01/26/2019: BUN 44; Creatinine, Ser 2.18; Potassium 5.5; Sodium 132    Lipid Panel    Component Value Date/Time   CHOL 141 11/02/2018 0358   TRIG 148 11/02/2018 0358   HDL 40 (L) 11/02/2018 0358   CHOLHDL 3.5 11/02/2018 0358   VLDL 30 11/02/2018 0358   LDLCALC 71 11/02/2018 0358       Other studies Reviewed: Additional studies/ records that were reviewed today include: . Echo 11/02/18 IMPRESSIONS   1. Left ventricular ejection fraction, by visual estimation, is 60 to 65%. The left ventricle has normal function. Normal left ventricular size. Left ventricular septal wall thickness was mildly increased. Mildly increased left ventricular posterior  wall thickness. 2. Left ventricular diastolic Doppler  parameters are consistent with impaired relaxation pattern of LV diastolic filling. 3. Elevated left ventricular end-diastolic pressure. 4. Global right ventricle has normal systolic function.The right ventricular size is normal. No increase in right ventricular wall thickness. 5. Left atrial size was moderately dilated. 6. Right atrial size was normal. 7. Moderate mitral annular calcification. 8. The mitral valve is normal in structure. Trace mitral valve regurgitation. No evidence of mitral stenosis. 9. The tricuspid valve is normal in structure. Tricuspid valve regurgitation is mild. 10. The aortic valve is tricuspid. Aortic valve regurgitation is trivial by color flow Doppler. Moderate aortic valve sclerosis/calcification without any evidence of aortic stenosis. 11. The pulmonic valve was normal in structure. Pulmonic valve regurgitation is mild by color flow Doppler. 12. Normal pulmonary artery systolic pressure. 13. The inferior vena cava is normal in size with greater than 50% respiratory variability, suggesting right atrial pressure of 3 mmHg.  FINDINGS Left Ventricle: Left ventricular ejection fraction, by visual estimation, is 60 to 65%. The left ventricle has normal function. Left ventricular septal wall thickness was mildly increased. Mildly increased left ventricular posterior wall thickness.  There is no left ventricular hypertrophy. Normal left ventricular size. Spectral Doppler shows Left ventricular diastolic Doppler parameters are consistent with impaired relaxation pattern of LV diastolic filling. Elevated left ventricular end-diastolic  pressure.  Right Ventricle: The right ventricular size is normal. No increase in right ventricular wall thickness. Global RV systolic function is has normal systolic function. The tricuspid regurgitant velocity is 2.55 m/s, and with an assumed right atrial pressure of 3 mmHg, the estimated right ventricular systolic pressure is normal  at 29.0 mmHg.  Left Atrium: Left atrial size was moderately dilated.  Right Atrium: Right atrial size was normal in size  Pericardium: There is no evidence of pericardial effusion.  Mitral Valve: The mitral valve is normal in structure. Moderate mitral annular calcification. No evidence of mitral valve stenosis by observation. Trace mitral valve regurgitation.  Tricuspid Valve: The tricuspid valve  is normal in structure. Tricuspid valve regurgitation is mild by color flow Doppler.  Aortic Valve: The aortic valve is tricuspid. Aortic valve regurgitation is trivial by color flow Doppler. Aortic regurgitation PHT measures 652 msec. Moderate aortic valve sclerosis/calcification is present, without any evidence of aortic stenosis.  Pulmonic Valve: The pulmonic valve was normal in structure. Pulmonic valve regurgitation is mild by color flow Doppler.  Aorta: The aortic root, ascending aorta and aortic arch are all structurally normal, with no evidence of dilitation or obstruction.  Venous: The inferior vena cava is normal in size with greater than 50% respiratory variability, suggesting right atrial pressure of 3 mmHg.  IAS/Shunts: No atrial level shunt detected by color flow Doppler. No ventricular septal defect is seen or detected. There is no evidence of an atrial septal defect.   ASSESSMENT AND PLAN:  1.  Angina with some improvement with increase of imdur, sl NTG does relieve pain.  Will increase imdur to 120 in QM and 60 in PM and follow up with Dr. Tamala Julian, concern with cath due to CKD - will increase hydralazine as well.  Medical therapy for now and hope will control angina.  2.  HTN uncontrolled with increase hydralazine to 20 mg TID    3.  Chronic diastolic HF, euvolemic today no SOB  4.  CKD-4 with elevated K+ - she has seen nephrology.   5.  HLD continue statin.     Current medicines are reviewed with the patient today.  The patient Has no concerns regarding  medicines.  The following changes have been made:  See above Labs/ tests ordered today include:see above  Disposition:   FU:  see above  Signed, Cecilie Kicks, NP  02/13/2019 10:37 PM    [facility] [place], [place], [address] [place], [place] Phone: [phone]; Fax: [phone]

## 2019-02-11 NOTE — Patient Instructions (Signed)
Medication Instructions:  Your physician has recommended you make the following change in your medication:  1.  INCREASE the Hydralazine to 10 mg taking 2 tablets by mouth three times a day 2.  INCREASE the Isosorbide to 2 tablets in the a.m. and 1 tablet in the evening.   *If you need a refill on your cardiac medications before your next appointment, please call your pharmacy*  Lab Work: None ordered  If you have labs (blood work) drawn today and your tests are completely normal, you will receive your results only by: Marland Kitchen MyChart Message (if you have MyChart) OR . A paper copy in the mail If you have any lab test that is abnormal or we need to change your treatment, we will call you to review the results.  Testing/Procedures: None ordered  Follow-Up: At [facility], you and your health needs are our priority.  As part of our continuing mission to provide you with exceptional heart care, we have created designated Provider Care Teams.  These Care Teams include your primary Cardiologist (physician) and Advanced Practice Providers (APPs -  Physician Assistants and Nurse Practitioners) who all work together to provide you with the care you need, when you need it.  Your next appointment:   03/03/2018 ARRIVE AT 3:05 TO SEE DR. Tamala Julian IN PERSON

## 2019-02-13 ENCOUNTER — Encounter: Payer: Self-pay | Admitting: Cardiology

## 2019-02-23 ENCOUNTER — Other Ambulatory Visit: Payer: Medicare HMO

## 2019-03-03 NOTE — Progress Notes (Deleted)
Cardiology Office Note:    Date:  03/03/2019   ID:  [name], DOB 09-03-1932, MRN [id number]  PCP:  Dierdre Harness, FNP  Cardiologist:  Sinclair Grooms, MD   Referring MD: Dierdre Harness, FNP     History of Present Illness:     [name] is a 86 y.o. female with a hx of Chronic Diastolic CHF, Chest pain with Myoview 5/19: small inf basal ischemia, low risk >> Med Rx., CVA, hypertension, hyperlipidemia, Diabetes mellitus 2 (diet), Chronic kidney disease, TongueCAs/p radiation in [place] in 1990, Tobacco use, and GERD.  ***  Past Medical History:  Diagnosis Date  . Anemia 04/03/2011  . Anginal pain ([place])   . Anxiety   . Arthritis    "feet" (06/02/2017)  . Blind left eye 1999  . Depression   . Exertional dyspnea   . GERD (gastroesophageal reflux disease)   . High cholesterol   . Hip fracture ([place]) 07/17/11   fall from 1-2 feet; left  . Hypertension   . Ischemic stroke ([place]) 06/02/2017   "weak moving around" (06/02/2017)  . Prosthetic eye globe 1999   "from diabetic; left eye"  . Type II diabetes mellitus ([place])    not taking any medication (06/02/2017)    Past Surgical History:  Procedure Laterality Date  . ENUCLEATION Left   . FRACTURE SURGERY    . HIP ARTHROPLASTY  07/18/2011   Procedure: ARTHROPLASTY BIPOLAR HIP;  Surgeon: Nita Sells, MD;  Location: [facility];  Service: Orthopedics;  Laterality: Left;  . INTRAOCULAR PROSTHESES INSERTION  1999   left  . TONGUE BIOPSY  1998   "cancer"  . TUBAL LIGATION  1970's    Current Medications: No outpatient medications have been marked as taking for the 03/04/19 encounter (Appointment) with Belva Crome, MD.     Allergies:   Patient has no known allergies.   Social History   Socioeconomic History  . Marital status: Single    Spouse name: Not on file  . Number of children: Not on file  . Years of education: Not on file  . Highest education level: Not on file  Occupational History  . Not on  file  Tobacco Use  . Smoking status: Former Smoker    Packs/day: 1.50    Years: 47.00    Pack years: 70.50    Types: Cigarettes    Quit date: 09/13/1997    Years since quitting: 21.4  . Smokeless tobacco: Never Used  Substance and Sexual Activity  . Alcohol use: Yes    Alcohol/week: 17.0 standard drinks    Types: 17 Shots of liquor per week    Comment: 06/02/2017 "1/5 rum q weekend"  . Drug use: No    Types: Marijuana, Heroin    Comment: 06/02/2017  "last drug use was in the 1980s"  . Sexual activity: Not Currently  Other Topics Concern  . Not on file  Social History Narrative  . Not on file   Social Determinants of Health   Financial Resource Strain:   . Difficulty of Paying Living Expenses: Not on file  Food Insecurity:   . Worried About Charity fundraiser in the Last Year: Not on file  . Ran Out of Food in the Last Year: Not on file  Transportation Needs:   . Lack of Transportation (Medical): Not on file  . Lack of Transportation (Non-Medical): Not on file  Physical Activity:   . Days of Exercise per Week: Not  on file  . Minutes of Exercise per Session: Not on file  Stress:   . Feeling of Stress : Not on file  Social Connections:   . Frequency of Communication with Friends and Family: Not on file  . Frequency of Social Gatherings with Friends and Family: Not on file  . Attends Religious Services: Not on file  . Active Member of Clubs or Organizations: Not on file  . Attends Archivist Meetings: Not on file  . Marital Status: Not on file     Family History: The patient's family history includes Diabetes type II in her father, mother, and sister. There is no history of Colon cancer.  ROS:   Please see the history of present illness.    *** All other systems reviewed and are negative.  EKGs/Labs/Other Studies Reviewed:    The following studies were reviewed today: ***  EKG:  EKG ***  Recent Labs: 11/10/2018: NT-Pro BNP 924 01/19/2019: Hemoglobin  11.3; Platelets 216 01/20/2019: B Natriuretic Peptide 84.3 01/26/2019: BUN 44; Creatinine, Ser 2.18; Potassium 5.5; Sodium 132  Recent Lipid Panel    Component Value Date/Time   CHOL 141 11/02/2018 0358   TRIG 148 11/02/2018 0358   HDL 40 (L) 11/02/2018 0358   CHOLHDL 3.5 11/02/2018 0358   VLDL 30 11/02/2018 0358   LDLCALC 71 11/02/2018 0358    Physical Exam:    VS:  There were no vitals taken for this visit.    Wt Readings from Last 3 Encounters:  02/11/19 146 lb 12.8 oz (66.6 kg)  01/26/19 144 lb (65.3 kg)  12/20/18 146 lb 12.8 oz (66.6 kg)     GEN: ***. No acute distress HEENT: Normal NECK: No JVD. LYMPHATICS: No lymphadenopathy CARDIAC: *** RRR without murmur, gallop, or edema. VASCULAR: *** Normal Pulses. No bruits. RESPIRATORY:  Clear to auscultation without rales, wheezing or rhonchi  ABDOMEN: Soft, non-tender, non-distended, No pulsatile mass, MUSCULOSKELETAL: No deformity  SKIN: Warm and dry NEUROLOGIC:  Alert and oriented x 3 PSYCHIATRIC:  Normal affect   ASSESSMENT:    No diagnosis found. PLAN:    In order of problems listed above:  1. ***   Medication Adjustments/Labs and Tests Ordered: Current medicines are reviewed at length with the patient today.  Concerns regarding medicines are outlined above.  No orders of the defined types were placed in this encounter.  No orders of the defined types were placed in this encounter.   There are no Patient Instructions on file for this visit.   Signed, Sinclair Grooms, MD  03/03/2019 8:14 PM    [facility]

## 2019-03-04 ENCOUNTER — Ambulatory Visit: Payer: Medicare Other | Admitting: Interventional Cardiology

## 2019-04-07 ENCOUNTER — Emergency Department: Payer: Medicare Other

## 2019-04-07 ENCOUNTER — Other Ambulatory Visit: Payer: Self-pay

## 2019-04-07 ENCOUNTER — Encounter: Payer: Self-pay | Admitting: Internal Medicine

## 2019-04-07 ENCOUNTER — Inpatient Hospital Stay
Admission: EM | Admit: 2019-04-07 | Discharge: 2019-04-13 | DRG: 286 | Disposition: A | Discharge status: Home Health | Payer: Medicare Other | Attending: Internal Medicine | Admitting: Internal Medicine

## 2019-04-07 DIAGNOSIS — N2581 Secondary hyperparathyroidism of renal origin: Secondary | ICD-10-CM | POA: Diagnosis present

## 2019-04-07 DIAGNOSIS — G8191 Hemiplegia, unspecified affecting right dominant side: Secondary | ICD-10-CM | POA: Diagnosis not present

## 2019-04-07 DIAGNOSIS — F329 Major depressive disorder, single episode, unspecified: Secondary | ICD-10-CM | POA: Diagnosis present

## 2019-04-07 DIAGNOSIS — D631 Anemia in chronic kidney disease: Secondary | ICD-10-CM | POA: Diagnosis present

## 2019-04-07 DIAGNOSIS — Z833 Family history of diabetes mellitus: Secondary | ICD-10-CM

## 2019-04-07 DIAGNOSIS — R0602 Shortness of breath: Secondary | ICD-10-CM

## 2019-04-07 DIAGNOSIS — F1010 Alcohol abuse, uncomplicated: Secondary | ICD-10-CM | POA: Diagnosis present

## 2019-04-07 DIAGNOSIS — I200 Unstable angina: Secondary | ICD-10-CM

## 2019-04-07 DIAGNOSIS — N184 Chronic kidney disease, stage 4 (severe): Secondary | ICD-10-CM

## 2019-04-07 DIAGNOSIS — N179 Acute kidney failure, unspecified: Secondary | ICD-10-CM

## 2019-04-07 DIAGNOSIS — F419 Anxiety disorder, unspecified: Secondary | ICD-10-CM | POA: Diagnosis present

## 2019-04-07 DIAGNOSIS — G8929 Other chronic pain: Secondary | ICD-10-CM | POA: Diagnosis present

## 2019-04-07 DIAGNOSIS — I358 Other nonrheumatic aortic valve disorders: Secondary | ICD-10-CM | POA: Diagnosis present

## 2019-04-07 DIAGNOSIS — F1011 Alcohol abuse, in remission: Secondary | ICD-10-CM | POA: Diagnosis present

## 2019-04-07 DIAGNOSIS — N183 Chronic kidney disease, stage 3 unspecified: Secondary | ICD-10-CM | POA: Diagnosis present

## 2019-04-07 DIAGNOSIS — E785 Hyperlipidemia, unspecified: Secondary | ICD-10-CM | POA: Diagnosis not present

## 2019-04-07 DIAGNOSIS — I160 Hypertensive urgency: Secondary | ICD-10-CM

## 2019-04-07 DIAGNOSIS — I10 Essential (primary) hypertension: Secondary | ICD-10-CM | POA: Diagnosis present

## 2019-04-07 DIAGNOSIS — Z970 Presence of artificial eye: Secondary | ICD-10-CM

## 2019-04-07 DIAGNOSIS — I2510 Atherosclerotic heart disease of native coronary artery without angina pectoris: Secondary | ICD-10-CM

## 2019-04-07 DIAGNOSIS — Z20822 Contact with and (suspected) exposure to covid-19: Secondary | ICD-10-CM | POA: Diagnosis present

## 2019-04-07 DIAGNOSIS — K219 Gastro-esophageal reflux disease without esophagitis: Secondary | ICD-10-CM

## 2019-04-07 DIAGNOSIS — N170 Acute kidney failure with tubular necrosis: Secondary | ICD-10-CM | POA: Diagnosis not present

## 2019-04-07 DIAGNOSIS — R778 Other specified abnormalities of plasma proteins: Secondary | ICD-10-CM

## 2019-04-07 DIAGNOSIS — I5032 Chronic diastolic (congestive) heart failure: Secondary | ICD-10-CM | POA: Diagnosis not present

## 2019-04-07 DIAGNOSIS — I639 Cerebral infarction, unspecified: Secondary | ICD-10-CM | POA: Diagnosis present

## 2019-04-07 DIAGNOSIS — I959 Hypotension, unspecified: Secondary | ICD-10-CM | POA: Diagnosis not present

## 2019-04-07 DIAGNOSIS — E7800 Pure hypercholesterolemia, unspecified: Secondary | ICD-10-CM | POA: Diagnosis present

## 2019-04-07 DIAGNOSIS — H5462 Unqualified visual loss, left eye, normal vision right eye: Secondary | ICD-10-CM | POA: Diagnosis present

## 2019-04-07 DIAGNOSIS — Z7982 Long term (current) use of aspirin: Secondary | ICD-10-CM

## 2019-04-07 DIAGNOSIS — Z87891 Personal history of nicotine dependence: Secondary | ICD-10-CM

## 2019-04-07 DIAGNOSIS — R29705 NIHSS score 5: Secondary | ICD-10-CM | POA: Diagnosis not present

## 2019-04-07 DIAGNOSIS — I130 Hypertensive heart and chronic kidney disease with heart failure and stage 1 through stage 4 chronic kidney disease, or unspecified chronic kidney disease: Secondary | ICD-10-CM | POA: Diagnosis present

## 2019-04-07 DIAGNOSIS — Z79899 Other long term (current) drug therapy: Secondary | ICD-10-CM

## 2019-04-07 DIAGNOSIS — M899 Disorder of bone, unspecified: Secondary | ICD-10-CM | POA: Diagnosis present

## 2019-04-07 DIAGNOSIS — R079 Chest pain, unspecified: Secondary | ICD-10-CM

## 2019-04-07 DIAGNOSIS — Z8673 Personal history of transient ischemic attack (TIA), and cerebral infarction without residual deficits: Secondary | ICD-10-CM

## 2019-04-07 DIAGNOSIS — E1169 Type 2 diabetes mellitus with other specified complication: Secondary | ICD-10-CM

## 2019-04-07 DIAGNOSIS — I25110 Atherosclerotic heart disease of native coronary artery with unstable angina pectoris: Principal | ICD-10-CM | POA: Diagnosis present

## 2019-04-07 DIAGNOSIS — I493 Ventricular premature depolarization: Secondary | ICD-10-CM | POA: Diagnosis present

## 2019-04-07 DIAGNOSIS — Z96642 Presence of left artificial hip joint: Secondary | ICD-10-CM | POA: Diagnosis present

## 2019-04-07 DIAGNOSIS — I471 Supraventricular tachycardia: Secondary | ICD-10-CM | POA: Diagnosis present

## 2019-04-07 DIAGNOSIS — R7989 Other specified abnormal findings of blood chemistry: Secondary | ICD-10-CM | POA: Diagnosis present

## 2019-04-07 DIAGNOSIS — E1122 Type 2 diabetes mellitus with diabetic chronic kidney disease: Secondary | ICD-10-CM | POA: Diagnosis present

## 2019-04-07 DIAGNOSIS — I248 Other forms of acute ischemic heart disease: Secondary | ICD-10-CM | POA: Diagnosis present

## 2019-04-07 DIAGNOSIS — R000 Tachycardia, unspecified: Secondary | ICD-10-CM

## 2019-04-07 HISTORY — DX: Chronic kidney disease, stage 3 unspecified: N18.30

## 2019-04-07 HISTORY — DX: Other ill-defined heart diseases: I51.89

## 2019-04-07 HISTORY — DX: Other nonrheumatic aortic valve disorders: I35.8

## 2019-04-07 LAB — TROPONIN I (HIGH SENSITIVITY)
Troponin I (High Sensitivity): 60 ng/L — ABNORMAL HIGH (ref <18)
Troponin I (High Sensitivity): 71 ng/L — ABNORMAL HIGH (ref <18)
Troponin I (High Sensitivity): 88 ng/L — ABNORMAL HIGH (ref <18)
Troponin I (High Sensitivity): 81 ng/L — ABNORMAL HIGH (ref <18)

## 2019-04-07 LAB — CBC
HCT: 37.2 % (ref 36.0–46.0)
Hemoglobin: 12.1 g/dL (ref 12.0–15.0)
MCH: 29.7 pg (ref 26.0–34.0)
MCHC: 32.5 g/dL (ref 30.0–36.0)
MCV: 91.4 fL (ref 80.0–100.0)
Platelets: 229 10*3/uL (ref 150–400)
RBC: 4.07 MIL/uL (ref 3.87–5.11)
RDW: 14.5 % (ref 11.5–15.5)
WBC: 7.6 10*3/uL (ref 4.0–10.5)
nRBC: 0 % (ref 0.0–0.2)

## 2019-04-07 LAB — RESPIRATORY PANEL BY RT PCR (FLU A&B, COVID)
Influenza A by PCR: NEGATIVE
Influenza B by PCR: NEGATIVE
SARS Coronavirus 2 by RT PCR: NEGATIVE

## 2019-04-07 LAB — COMPREHENSIVE METABOLIC PANEL
ALT: 27 U/L (ref 0–44)
AST: 47 U/L — ABNORMAL HIGH (ref 15–41)
Albumin: 3.6 g/dL (ref 3.5–5.0)
Alkaline Phosphatase: 75 U/L (ref 38–126)
Anion gap: 8 (ref 5–15)
BUN: 18 mg/dL (ref 8–23)
CO2: 25 mmol/L (ref 22–32)
Calcium: 9.1 mg/dL (ref 8.9–10.3)
Chloride: 107 mmol/L (ref 98–111)
Creatinine, Ser: 1.14 mg/dL — ABNORMAL HIGH (ref 0.44–1.00)
GFR calc Af Amer: 51 mL/min — ABNORMAL LOW (ref >60)
GFR calc non Af Amer: 44 mL/min — ABNORMAL LOW (ref >60)
Glucose, Bld: 118 mg/dL — ABNORMAL HIGH (ref 70–99)
Potassium: 4.3 mmol/L (ref 3.5–5.1)
Sodium: 140 mmol/L (ref 135–145)
Total Bilirubin: 0.7 mg/dL (ref 0.3–1.2)
Total Protein: 7.6 g/dL (ref 6.5–8.1)

## 2019-04-07 LAB — URINE DRUG SCREEN, QUALITATIVE (ARMC ONLY)
Amphetamines, Ur Screen: NOT DETECTED
Barbiturates, Ur Screen: NOT DETECTED
Benzodiazepine, Ur Scrn: NOT DETECTED
Cannabinoid 50 Ng, Ur ~~LOC~~: NOT DETECTED
Cocaine Metabolite,Ur ~~LOC~~: NOT DETECTED
MDMA (Ecstasy)Ur Screen: NOT DETECTED
Methadone Scn, Ur: NOT DETECTED
Opiate, Ur Screen: POSITIVE — AB
Phencyclidine (PCP) Ur S: NOT DETECTED
Tricyclic, Ur Screen: NOT DETECTED

## 2019-04-07 LAB — BRAIN NATRIURETIC PEPTIDE: B Natriuretic Peptide: 1028 pg/mL — ABNORMAL HIGH (ref 0.0–100.0)

## 2019-04-07 MED ORDER — FOLIC ACID 1 MG PO TABS: 1.0000 mg | ORAL_TABLET | Freq: Every day | ORAL | Status: DC

## 2019-04-07 MED ORDER — HYDRALAZINE HCL 10 MG PO TABS
10.0000 mg | ORAL_TABLET | Freq: Three times a day (TID) | ORAL | Status: DC
  Administered 2019-04-07: 10 mg via ORAL
  Filled 2019-04-07 (×2): qty 1

## 2019-04-07 MED ORDER — NITROGLYCERIN 0.4 MG SL SUBL: 0.4000 mg | SUBLINGUAL_TABLET | SUBLINGUAL | Status: DC | PRN

## 2019-04-07 MED ORDER — LORAZEPAM 2 MG/ML IJ SOLN: 0.0000 mg | Freq: Two times a day (BID) | INTRAMUSCULAR | Status: DC

## 2019-04-07 MED ORDER — FOLIC ACID 1 MG PO TABS
1.0000 mg | ORAL_TABLET | Freq: Every day | ORAL | Status: DC
  Administered 2019-04-07 – 2019-04-13 (×6): 1 mg via ORAL
  Filled 2019-04-07 (×6): qty 1

## 2019-04-07 MED ORDER — MORPHINE SULFATE (PF) 2 MG/ML IV SOLN
0.5000 mg | INTRAVENOUS | Status: DC | PRN
  Administered 2019-04-07: 0.5 mg via INTRAVENOUS
  Filled 2019-04-07: qty 1

## 2019-04-07 MED ORDER — ASPIRIN EC 81 MG PO TBEC
81.0000 mg | DELAYED_RELEASE_TABLET | Freq: Every day | ORAL | Status: DC
  Administered 2019-04-09 – 2019-04-13 (×5): 81 mg via ORAL
  Filled 2019-04-07 (×5): qty 1

## 2019-04-07 MED ORDER — ISOSORBIDE MONONITRATE ER 60 MG PO TB24
60.0000 mg | ORAL_TABLET | Freq: Two times a day (BID) | ORAL | Status: DC
  Administered 2019-04-07 – 2019-04-13 (×11): 60 mg via ORAL
  Filled 2019-04-07 (×11): qty 1

## 2019-04-07 MED ORDER — THIAMINE HCL 100 MG/ML IJ SOLN
100.0000 mg | Freq: Every day | INTRAMUSCULAR | Status: DC
  Filled 2019-04-07: qty 2

## 2019-04-07 MED ORDER — LORAZEPAM 2 MG/ML IJ SOLN: 1.0000 mg | INTRAMUSCULAR | Status: DC | PRN

## 2019-04-07 MED ORDER — BRIMONIDINE TARTRATE 0.15 % OP SOLN
1.0000 [drp] | Freq: Two times a day (BID) | OPHTHALMIC | Status: DC
  Administered 2019-04-07 – 2019-04-13 (×11): 1 [drp] via OPHTHALMIC
  Filled 2019-04-07: qty 5

## 2019-04-07 MED ORDER — ADULT MULTIVITAMIN W/MINERALS CH
1.0000 | ORAL_TABLET | Freq: Every day | ORAL | Status: DC
  Administered 2019-04-07 – 2019-04-13 (×6): 1 via ORAL
  Filled 2019-04-07 (×6): qty 1

## 2019-04-07 MED ORDER — SODIUM CHLORIDE 0.9 % IV SOLN: 250.0000 mL | INTRAVENOUS | Status: DC | PRN

## 2019-04-07 MED ORDER — SODIUM CHLORIDE 0.9 % IV SOLN: INTRAVENOUS | Status: DC

## 2019-04-07 MED ORDER — ACETAMINOPHEN 325 MG PO TABS: 650.0000 mg | ORAL_TABLET | ORAL | Status: DC | PRN

## 2019-04-07 MED ORDER — THIAMINE HCL 100 MG PO TABS
100.0000 mg | ORAL_TABLET | Freq: Every day | ORAL | Status: DC
  Administered 2019-04-07 – 2019-04-13 (×6): 100 mg via ORAL
  Filled 2019-04-07 (×6): qty 1

## 2019-04-07 MED ORDER — ASPIRIN 81 MG PO CHEW
324.0000 mg | CHEWABLE_TABLET | Freq: Once | ORAL | Status: AC
  Administered 2019-04-07: 324 mg via ORAL
  Filled 2019-04-07: qty 4

## 2019-04-07 MED ORDER — DILTIAZEM HCL 25 MG/5ML IV SOLN
10.0000 mg | Freq: Once | INTRAVENOUS | Status: AC
  Administered 2019-04-07: 10 mg via INTRAVENOUS
  Filled 2019-04-07: qty 5

## 2019-04-07 MED ORDER — SODIUM CHLORIDE 0.9% FLUSH
3.0000 mL | Freq: Two times a day (BID) | INTRAVENOUS | Status: DC
  Administered 2019-04-07 – 2019-04-11 (×5): 3 mL via INTRAVENOUS

## 2019-04-07 MED ORDER — SODIUM CHLORIDE 0.9% FLUSH: 3.0000 mL | INTRAVENOUS | Status: DC | PRN

## 2019-04-07 MED ORDER — LORAZEPAM 1 MG PO TABS: 1.0000 mg | ORAL_TABLET | ORAL | Status: DC | PRN

## 2019-04-07 MED ORDER — PANTOPRAZOLE SODIUM 40 MG PO TBEC
40.0000 mg | DELAYED_RELEASE_TABLET | Freq: Every day | ORAL | Status: DC
  Administered 2019-04-09 – 2019-04-13 (×5): 40 mg via ORAL
  Filled 2019-04-07 (×5): qty 1

## 2019-04-07 MED ORDER — LORAZEPAM 2 MG/ML IJ SOLN: 0.0000 mg | Freq: Four times a day (QID) | INTRAMUSCULAR | Status: DC

## 2019-04-07 MED ORDER — ASPIRIN 81 MG PO CHEW
81.0000 mg | CHEWABLE_TABLET | ORAL | Status: AC
  Administered 2019-04-08: 06:00:00 81 mg via ORAL
  Filled 2019-04-07: qty 1

## 2019-04-07 MED ORDER — CARVEDILOL 12.5 MG PO TABS
12.5000 mg | ORAL_TABLET | Freq: Two times a day (BID) | ORAL | Status: DC
  Administered 2019-04-07: 12.5 mg via ORAL
  Filled 2019-04-07 (×2): qty 1

## 2019-04-07 MED ORDER — HEPARIN SODIUM (PORCINE) 5000 UNIT/ML IJ SOLN
5000.0000 [IU] | Freq: Three times a day (TID) | INTRAMUSCULAR | Status: DC
  Administered 2019-04-07 – 2019-04-13 (×16): 5000 [IU] via SUBCUTANEOUS
  Filled 2019-04-07 (×17): qty 1

## 2019-04-07 MED ORDER — LATANOPROST 0.005 % OP SOLN
1.0000 [drp] | Freq: Every day | OPHTHALMIC | Status: DC
  Administered 2019-04-07 – 2019-04-12 (×6): 1 [drp] via OPHTHALMIC
  Filled 2019-04-07: qty 2.5

## 2019-04-07 MED ORDER — ONDANSETRON HCL 4 MG/2ML IJ SOLN
4.0000 mg | Freq: Four times a day (QID) | INTRAMUSCULAR | Status: DC | PRN
  Administered 2019-04-09: 4 mg via INTRAVENOUS
  Filled 2019-04-07: qty 2

## 2019-04-07 MED ORDER — HYDRALAZINE HCL 20 MG/ML IJ SOLN
5.0000 mg | INTRAMUSCULAR | Status: DC | PRN
  Administered 2019-04-07: 5 mg via INTRAVENOUS
  Filled 2019-04-07: qty 1

## 2019-04-07 MED ORDER — GABAPENTIN 300 MG PO CAPS
300.0000 mg | ORAL_CAPSULE | Freq: Three times a day (TID) | ORAL | Status: DC
  Administered 2019-04-07 – 2019-04-13 (×16): 300 mg via ORAL
  Filled 2019-04-07 (×16): qty 1

## 2019-04-07 MED ORDER — ADULT MULTIVITAMIN W/MINERALS CH: 1.0000 | ORAL_TABLET | Freq: Every day | ORAL | Status: DC

## 2019-04-07 MED ORDER — ATORVASTATIN CALCIUM 20 MG PO TABS
40.0000 mg | ORAL_TABLET | Freq: Every day | ORAL | Status: DC
  Administered 2019-04-07 – 2019-04-12 (×6): 40 mg via ORAL
  Filled 2019-04-07 (×6): qty 2

## 2019-04-07 NOTE — H&P (Signed)
History and Physical    [name] IRS:[id number] DOB: Jun 21, 1932 DOA: 04/07/2019  Referring MD/NP/PA:   PCP: Dierdre Harness, FNP   Patient coming from:  The patient is coming from home.  At baseline, pt is independent for most of ADL.        Chief Complaint: chest pain  HPI: [name] is a 86 y.o. female with medical history significant of hypertension, hyperlipidemia, diet-controlled diabetes, stroke, GERD, depression, anxiety, left eye blindness, alcohol abuse in remission, CKD stage III, dCHF, who presents with chest pain.  Patient states that she started having chest pain since yesterday.  She still has some chest pain earlier today, currently chest pain-free.  She states that the chest pain is located in substernal area, pressure-like, moderate, nonradiating.  Associated with mild shortness of breath.  She also has mild dry cough.  No fever or chills.  Patient denies nausea, vomiting, diarrhea, abdominal pain, symptoms of UTI or unilateral weakness.  No recent fall.  No dark stool.  Patient states that she quit drinking alcohol for more than 6 months.  Patient initially had sinus tachycardia with heart rate of 132, which improved to 71 after received 10 mg of Cardizem by IV in ED.  ED Course: pt was found to have troponin 60, 71, 88, UDS positive for opiates, BNP 1028, negative Covid PCR, stable renal function, temperature normal, blood pressure 172/88, oxygen saturation 89% initially, currently 97% on room air.  RR 32 -->16.  Chest x-ray negative.  Patient is placed on telemetry bed for observation.  Cardiology, Dr. Rockey Situ is consulted.  Review of Systems:   General: no fevers, chills, no body weight gain, has fatigue HEENT: no blurry vision, hearing changes or sore throat Respiratory: has dyspnea, coughing, no wheezing CV: has chest pain, no palpitations GI: no nausea, vomiting, abdominal pain, diarrhea, constipation GU: no dysuria, burning on urination, increased  urinary frequency, hematuria  Ext: no leg edema Neuro: no unilateral weakness, numbness, or tingling, no vision change or hearing loss Skin: no rash, no skin tear. MSK: No muscle spasm, no deformity, no limitation of range of movement in spin Heme: No easy bruising.  Travel history: No recent long distant travel.  Allergy: No Known Allergies  Past Medical History:  Diagnosis Date  . Anemia 04/03/2011  . Anginal pain ([place])    a. 02/2014 MV: Low risk; b. 05/2017 MV: small area of inferior basal ischemia. EF 54% - low risk-->Med mgmt.  Marland Kitchen Anxiety   . Aortic valve sclerosis w/ systolic murmur    a. 00/9381 AoV sclerosis w/o stenosis.  . Arthritis    "feet" (06/02/2017)  . Blind left eye 1999  . CKD (chronic kidney disease) stage 3, GFR 30-59 ml/min   . Depression   . Diastolic dysfunction    a. 10/2018 Echo: EF 60-65%, mild septal/posterior LVH. Impaired relaxation. Nl RV size/fxn. Mod dil LA. Trace TR. Triv AI. Mod AoV sclerosis w/o stenosis. Nl PASP.  Marland Kitchen Exertional dyspnea   . GERD (gastroesophageal reflux disease)   . High cholesterol   . Hip fracture ([place]) 07/17/11   fall from 1-2 feet; left  . Hypertension   . Ischemic stroke ([place]) 05/2017   a. 06/2017 Holter: RSR, PACs, no afib.  . Prosthetic eye globe 1999   "from diabetic; left eye"  . Type II diabetes mellitus ([place])    not taking any medication (06/02/2017)    Past Surgical History:  Procedure Laterality Date  . ENUCLEATION Left   .  FRACTURE SURGERY    . HIP ARTHROPLASTY  07/18/2011   Procedure: ARTHROPLASTY BIPOLAR HIP;  Surgeon: Nita Sells, MD;  Location: [place];  Service: Orthopedics;  Laterality: Left;  . INTRAOCULAR PROSTHESES INSERTION  1999   left  . TONGUE BIOPSY  1998   "cancer"  . TUBAL LIGATION  1970's    Social History:  reports that she quit smoking about 21 years ago. Her smoking use included cigarettes. She has a 70.50 pack-year smoking history. She has never used smokeless tobacco. She reports  previous alcohol use of about 17.0 standard drinks of alcohol per week. She reports that she does not use drugs.  Family History:  Family History  Problem Relation Age of Onset  . Diabetes type II Mother   . Diabetes type II Father   . Diabetes type II Sister   . Colon cancer Neg Hx      Prior to Admission medications   Medication Sig Start Date End Date Taking? Authorizing Provider  acetaminophen (TYLENOL) 325 MG tablet Take 2 tablets (650 mg total) by mouth every 6 (six) hours as needed for mild pain or fever (or temp > 37.5 C (99.5 F)). 06/05/17   Elgergawy, Silver Huguenin, MD  ALPHAGAN P 0.1 % SOLN Place 1 drop into the right eye 2 (two) times daily. 10/10/15   [provider]  amLODipine (NORVASC) 10 MG tablet Take 10 mg by mouth daily.    [provider]  aspirin 81 MG tablet Take 81 mg by mouth daily.    [provider]  atorvastatin (LIPITOR) 40 MG tablet Take 1 tablet (40 mg total) by mouth daily at 6 PM. 02/11/19   Isaiah Serge, NP  carvedilol (COREG) 25 MG tablet Take 1 tablet (25 mg total) by mouth 2 (two) times daily with a meal. Take one daily if she does not feel better she will take another at about 2pm 11/03/18 02/11/19  Elodia Florence., MD  cephALEXin [facility]) 250 MG capsule  02/06/19   [provider]  folic acid (FOLVITE) 1 MG tablet Take 1 tablet (1 mg total) by mouth daily. 06/06/17   Elgergawy, Silver Huguenin, MD  gabapentin (NEURONTIN) 100 MG capsule Take 100 mg by mouth 3 (three) times daily.    [provider]  hydrALAZINE (APRESOLINE) 10 MG tablet Take 2 tablets (20 mg total) by mouth 3 (three) times daily. 02/11/19   Isaiah Serge, NP  isosorbide mononitrate (IMDUR) 60 MG 24 hr tablet Take 2 tablets by mouth in the a.m and take 1 tablet by mouth at bedtime 02/11/19   Isaiah Serge, NP  loratadine (CLARITIN) 10 MG tablet Take 10 mg by mouth daily. 01/20/18   [provider]  Multiple Vitamin (MULTIVITAMIN WITH MINERALS)  TABS tablet Take 1 tablet by mouth daily. 06/06/17   Elgergawy, Silver Huguenin, MD  nitroGLYCERIN (NITROSTAT) 0.4 MG SL tablet Place 1 tablet (0.4 mg total) under the tongue every 5 (five) minutes as needed for chest pain. 02/11/19 05/12/19  Isaiah Serge, NP  omeprazole (PRILOSEC) 20 MG capsule Take 1 capsule (20 mg total) by mouth daily. 02/11/19   Isaiah Serge, NP  TRAVATAN Z 0.004 % SOLN ophthalmic solution Place 1 drop into the right eye at bedtime.  11/24/12   [provider]    Physical Exam: Vitals:   04/07/19 1445 04/07/19 1556 04/07/19 1745 04/07/19 1820  BP:  (!) 198/84 (!) 188/70 (!) 137/91  Pulse: 80 75  78 (!) 130  Resp: 19 18    Temp:  98.4 F (36.9 C)    TempSrc:  Oral    SpO2: 100% 100%    Weight:  65.1 kg    Height:  5' (1.524 m)     General: Not in acute distress HEENT:       Eyes: PERRL and EOMI of right eye, no scleral icterus. Left eye blinded       ENT: No discharge from the ears and nose, no pharynx injection, no tonsillar enlargement.        Neck: No JVD, no bruit, no mass felt. Heme: No neck lymph node enlargement. Cardiac: S1/S2, RRR, No murmurs, No gallops or rubs. Respiratory: No rales, wheezing, rhonchi or rubs. GI: Soft, nondistended, nontender, no rebound pain, no organomegaly, BS present. GU: No hematuria Ext: No pitting leg edema bilaterally. 2+DP/PT pulse bilaterally. Musculoskeletal: No joint deformities, No joint redness or warmth, no limitation of ROM in spin. Skin: No rashes.  Neuro: Alert, oriented X3, cranial nerves II-XII grossly intact, moves all extremities normally.  Psych: Patient is not psychotic, no suicidal or hemocidal ideation.  Labs on Admission: I have personally reviewed following labs and imaging studies  CBC: Recent Labs  Lab 04/07/19 1116  WBC 7.6  HGB 12.1  HCT 37.2  MCV 91.4  PLT 250   Basic Metabolic Panel: Recent Labs  Lab 04/07/19 1116  NA 140  K 4.3  CL 107  CO2 25  GLUCOSE 118*  BUN 18   CREATININE 1.14*  CALCIUM 9.1   GFR: Estimated Creatinine Clearance: 30.9 mL/min (A) (by C-G formula based on SCr of 1.14 mg/dL (H)). Liver Function Tests: Recent Labs  Lab 04/07/19 1116  AST 47*  ALT 27  ALKPHOS 75  BILITOT 0.7  PROT 7.6  ALBUMIN 3.6   No results for input(s): LIPASE, AMYLASE in the last 168 hours. No results for input(s): AMMONIA in the last 168 hours. Coagulation Profile: No results for input(s): INR, PROTIME in the last 168 hours. Cardiac Enzymes: No results for input(s): CKTOTAL, CKMB, CKMBINDEX, TROPONINI in the last 168 hours. BNP (last 3 results) Recent Labs    11/10/18 1516  PROBNP 924*   HbA1C: No results for input(s): HGBA1C in the last 72 hours. CBG: No results for input(s): GLUCAP in the last 168 hours. Lipid Profile: No results for input(s): CHOL, HDL, LDLCALC, TRIG, CHOLHDL, LDLDIRECT in the last 72 hours. Thyroid Function Tests: No results for input(s): TSH, T4TOTAL, FREET4, T3FREE, THYROIDAB in the last 72 hours. Anemia Panel: No results for input(s): VITAMINB12, FOLATE, FERRITIN, TIBC, IRON, RETICCTPCT in the last 72 hours. Urine analysis:    Component Value Date/Time   COLORURINE YELLOW 06/02/2017 2110   APPEARANCEUR HAZY (A) 06/02/2017 2110   LABSPEC 1.005 06/02/2017 2110   PHURINE 7.0 06/02/2017 2110   GLUCOSEU NEGATIVE 06/02/2017 2110   HGBUR SMALL (A) 06/02/2017 2110   BILIRUBINUR NEGATIVE 06/02/2017 2110   KETONESUR NEGATIVE 06/02/2017 2110   PROTEINUR 100 (A) 06/02/2017 2110   UROBILINOGEN 0.2 02/07/2014 2241   NITRITE POSITIVE (A) 06/02/2017 2110   LEUKOCYTESUR MODERATE (A) 06/02/2017 2110   Sepsis Labs: @LABRCNTIP (procalcitonin:4,lacticidven:4) ) Recent Results (from the past 240 hour(s))  Respiratory Panel by RT PCR (Flu A&B, Covid) - Nasopharyngeal Swab     Status: None   Collection Time: 04/07/19 12:54 PM   Specimen: Nasopharyngeal Swab  Result Value Ref Range Status   SARS Coronavirus 2 by RT PCR NEGATIVE  NEGATIVE Final  Comment: (NOTE) SARS-CoV-2 target nucleic acids are NOT DETECTED. The SARS-CoV-2 RNA is generally detectable in upper respiratoy specimens during the acute phase of infection. The lowest concentration of SARS-CoV-2 viral copies this assay can detect is 131 copies/mL. A negative result does not preclude SARS-Cov-2 infection and should not be used as the sole basis for treatment or other patient management decisions. A negative result may occur with  improper specimen collection/handling, submission of specimen other than nasopharyngeal swab, presence of viral mutation(s) within the areas targeted by this assay, and inadequate number of viral copies (<131 copies/mL). A negative result must be combined with clinical observations, patient history, and epidemiological information. The expected result is Negative. Fact Sheet for Patients:  PinkCheek.be Fact Sheet for Healthcare Providers:  GravelBags.it This test is not yet ap proved or cleared by the [place] FDA and  has been authorized for detection and/or diagnosis of SARS-CoV-2 by FDA under an Emergency Use Authorization (EUA). This EUA will remain  in effect (meaning this test can be used) for the duration of the COVID-19 declaration under Section 564(b)(1) of the Act, 21 U.S.C. section 360bbb-3(b)(1), unless the authorization is terminated or revoked sooner.    Influenza A by PCR NEGATIVE NEGATIVE Final   Influenza B by PCR NEGATIVE NEGATIVE Final    Comment: (NOTE) The Xpert Xpress SARS-CoV-2/FLU/RSV assay is intended as an aid in  the diagnosis of influenza from Nasopharyngeal swab specimens and  should not be used as a sole basis for treatment. Nasal washings and  aspirates are unacceptable for Xpert Xpress SARS-CoV-2/FLU/RSV  testing. Fact Sheet for Patients: PinkCheek.be Fact Sheet for Healthcare  Providers: GravelBags.it This test is not yet approved or cleared by the [place] FDA and  has been authorized for detection and/or diagnosis of SARS-CoV-2 by  FDA under an Emergency Use Authorization (EUA). This EUA will remain  in effect (meaning this test can be used) for the duration of the  Covid-19 declaration under Section 564(b)(1) of the Act, 21  U.S.C. section 360bbb-3(b)(1), unless the authorization is  terminated or revoked. Performed at [facility], [address], [place], [place] [address]      Radiological Exams on Admission: DG [facility] 1 View  Result Date: 04/07/2019 CLINICAL DATA:  Chest pain. EXAM: PORTABLE CHEST 1 VIEW COMPARISON:  January 20, 2019. FINDINGS: Stable cardiomegaly. No pneumothorax or pleural effusion is noted. Both lungs are clear. The visualized skeletal structures are unremarkable. IMPRESSION: No active disease. Electronically Signed   By: Marijo Conception M.D.   On: 04/07/2019 11:39     EKG: Independently reviewed.  QTc 456, tachycardia, frequent PVC, ST depression in V3-V6  Assessment/Plan Principal Problem:   Chest pain Active Problems:   Chronic diastolic heart failure (HCC)   Ischemic stroke (HCC)   Alcohol abuse   GERD without esophagitis   CKD (chronic kidney disease), stage IV (HCC)   HTN (hypertension)   HLD (hyperlipidemia)   Elevated troponin   Chest pain and elevated trop: trop 60 -->71 -->88. Possible unstable angina vs. mild NSTEMI. Dr. Rockey Situ of card is consulted. - will place on Tele bed for obs - Trend Trop - Repeat EKG in the am  - prn Nitroglycerin, Morphine, and aspirin, lipitor ,imdure, coreg - Risk factor stratification: will check FLP and A1C  - f/u card recommendation  Chronic diastolic heart failure ([place]): 2D echo on 11/02/2018 showed EF of 60-85%.  Patient has elevated BNP 1028, but no leg edema or JVD.  Chest x-ray is negative for pulmonary edema.  Does not seem to  have CHF exacerbation.  Pt is not taking diuretics at home. -Hold off diuretics  Ischemic stroke [facility]): -ASA and lipitor  Alcohol abuse: in remission -Encourage patient does not drink alcohol again  HLD: -Lipitor  GERD without esophagitis -protoninx  CKD (chronic kidney disease), stage IV ([place]): stable. -f/u by BMP  HTN:  -Continue home medications: Coreg, hydralazine -hydralazine prn    DVT ppx: SQ Heparin Code Status: Full code per granddaughter Family Communication:  Yes, patient's grand daughter by phone  Disposition Plan:  Anticipate discharge back to previous home environment Consults called: Dr. Rockey Situ of cardiology Admission status: Tele bed for obs    Date of Service 04/07/2019    [facility]   If 7PM-7AM, please contact night-coverage [email or web address] 04/07/2019, 6:29 PM

## 2019-04-07 NOTE — ED Notes (Signed)
Provider, Ellsworth Lennox at bedside.

## 2019-04-07 NOTE — ED Triage Notes (Signed)
Pt arrived via [facility] from home with CP. Pain started yesterday but she woke up with the same pain today. Pt took nitro yesterday with no relief. Pt given 2 nitro, 324 asa, and 4mg  of morphine with ems.

## 2019-04-07 NOTE — Progress Notes (Signed)
Environmental Services notified and confirmed bedbug found on bedshets of patient. Also notified hospitaleist and all consultsants.

## 2019-04-07 NOTE — ED Notes (Signed)
Pt provided this RN with her COVID-19 vaccination card: 1st vaccine 03/09/19; 2nd dose 04/06/19.  Vaccination card given back to pt.

## 2019-04-07 NOTE — ED Provider Notes (Signed)
[facility] Emergency Department Provider Note       Time seen: ----------------------------------------- 11:12 AM on 04/07/2019 -----------------------------------------   I have reviewed the triage vital signs and the nursing notes.  HISTORY Chief Complaint No chief complaint on file.   HPI [name] is a 86 y.o. female with a history of anemia, anxiety, arthritis, GERD, hyperlipidemia, CVA who presents to the ED for chest pain that radiates from her chest into her jaw.  Patient reports her heart is been racing as well.  When her heart races she has the chest pain.  She denies any recent illness or other complaints.  Past Medical History:  Diagnosis Date  . Anemia 04/03/2011  . Anginal pain ([place])   . Anxiety   . Arthritis    "feet" (06/02/2017)  . Blind left eye 1999  . Depression   . Exertional dyspnea   . GERD (gastroesophageal reflux disease)   . High cholesterol   . Hip fracture ([place]) 07/17/11   fall from 1-2 feet; left  . Hypertension   . Ischemic stroke ([place]) 06/02/2017   "weak moving around" (06/02/2017)  . Prosthetic eye globe 1999   "from diabetic; left eye"  . Type II diabetes mellitus ([place])    not taking any medication (06/02/2017)    Patient Active Problem List   Diagnosis Date Noted  . Acute kidney injury superimposed on chronic kidney disease ([place]) 11/01/2018  . Leukocytosis 02/22/2018  . Influenza A 02/21/2018  . Wheezy bronchitis 02/21/2018  . Acute wheezy bronchitis 02/21/2018  . Hypertension associated with stage 3 chronic kidney disease due to type 2 diabetes mellitus ([place]) 06/09/2017  . Dyslipidemia associated with type 2 diabetes mellitus ([place]) 06/09/2017  . Stage 3 chronic kidney disease due to type 2 diabetes mellitus ([place]) 06/09/2017  . Peripheral sensory neuropathy due to type 2 diabetes mellitus ([place]) 06/09/2017  . GERD without esophagitis 06/09/2017  . Glaucoma due to type 2 diabetes mellitus ([place]) 06/09/2017   . Alcohol abuse 06/04/2017  . Ischemic stroke ([place]) 06/02/2017  . Type 2 diabetes mellitus with vascular disease ([place]) 06/02/2017  . Chest pain 06/02/2017  . Closed non-physeal fracture of metatarsal bone of right foot 07/31/2016  . Hypertensive urgency 02/17/2014  . Chronic diastolic heart failure ([place]) 03/15/2013  . Closed left hip fracture ([place]) 07/17/2011    Past Surgical History:  Procedure Laterality Date  . ENUCLEATION Left   . FRACTURE SURGERY    . HIP ARTHROPLASTY  07/18/2011   Procedure: ARTHROPLASTY BIPOLAR HIP;  Surgeon: Nita Sells, MD;  Location: [place];  Service: Orthopedics;  Laterality: Left;  . INTRAOCULAR PROSTHESES INSERTION  1999   left  . TONGUE BIOPSY  1998   "cancer"  . TUBAL LIGATION  1970's    Allergies Patient has no known allergies.  Social History Social History   Tobacco Use  . Smoking status: Former Smoker    Packs/day: 1.50    Years: 47.00    Pack years: 70.50    Types: Cigarettes    Quit date: 09/13/1997    Years since quitting: 21.5  . Smokeless tobacco: Never Used  Substance Use Topics  . Alcohol use: Yes    Alcohol/week: 17.0 standard drinks    Types: 17 Shots of liquor per week    Comment: 06/02/2017 "1/5 rum q weekend"  . Drug use: No    Types: Marijuana, Heroin    Comment: 06/02/2017  "last drug use was in the 1980s"  Review of Systems Constitutional: Negative for fever. Cardiovascular: Positive for chest pain, tachycardia Respiratory: Negative for shortness of breath. Gastrointestinal: Negative for abdominal pain, vomiting and diarrhea. Musculoskeletal: Negative for back pain. Skin: Negative for rash. Neurological: Negative for headaches, focal weakness or numbness.  All systems negative/normal/unremarkable except as stated in the HPI  ____________________________________________   PHYSICAL EXAM:  VITAL SIGNS: ED Triage Vitals  Enc Vitals Group     BP      Pulse      Resp      Temp      Temp src       SpO2      Weight      Height      Head Circumference      Peak Flow      Pain Score      Pain Loc      Pain Edu?      Excl. in [place]?     Constitutional: Alert and oriented. Well appearing and in no distress. Eyes: Prosthetic left eye, right eye is unremarkable Cardiovascular: Rapid rate, irregular rhythm. No murmurs, rubs, or gallops. Respiratory: Normal respiratory effort without tachypnea nor retractions. Breath sounds are clear and equal bilaterally. No wheezes/rales/rhonchi. Gastrointestinal: Soft and nontender. Normal bowel sounds Musculoskeletal: Nontender with normal range of motion in extremities. No lower extremity tenderness nor edema. Neurologic:  Normal speech and language. No gross focal neurologic deficits are appreciated.  Skin:  Skin is warm, dry and intact. No rash noted. Psychiatric: Mood and affect are normal. Speech and behavior are normal.  ____________________________________________  EKG: Interpreted by me.  Sinus rhythm with rate of 90 bpm, LVH with repolarization abnormality, normal QT  Repeat EKG interpreted by me: Sinus tachycardia with a rate of 135 bpm, PVCs, LVH with repolarization abnormality, normal QT  ____________________________________________  ED COURSE:  As part of my medical decision making, I reviewed the following data within the [place] History obtained from family if available, nursing notes, old chart and ekg, as well as notes from prior ED visits. Patient presented for chest pain, we will assess with labs and imaging as indicated at this time.   Procedures  [name] was evaluated in Emergency Department on 04/07/2019 for the symptoms described in the history of present illness. She was evaluated in the context of the global COVID-19 pandemic, which necessitated consideration that the patient might be at risk for infection with the SARS-CoV-2 virus that causes COVID-19. Institutional protocols and algorithms  that pertain to the evaluation of patients at risk for COVID-19 are in a state of rapid change based on information released by regulatory bodies including the CDC and federal and state organizations. These policies and algorithms were followed during the patient's care in the ED.  ____________________________________________   LABS (pertinent positives/negatives)  Labs Reviewed  COMPREHENSIVE METABOLIC PANEL - Abnormal; Notable for the following components:      Result Value   Glucose, Bld 118 (*)    Creatinine, Ser 1.14 (*)    AST 47 (*)    GFR calc non Af Amer 44 (*)    GFR calc Af Amer 51 (*)    All other components within normal limits  TROPONIN I (HIGH SENSITIVITY) - Abnormal; Notable for the following components:   Troponin I (High Sensitivity) 60 (*)    All other components within normal limits  CBC   CRITICAL CARE Performed by: Laurence Aly   Total critical care time: 30 minutes  Critical care time was exclusive of separately billable procedures and treating other patients.  Critical care was necessary to treat or prevent imminent or life-threatening deterioration.  Critical care was time spent personally by me on the following activities: development of treatment plan with patient and/or surrogate as well as nursing, discussions with consultants, evaluation of patient's response to treatment, examination of patient, obtaining history from patient or surrogate, ordering and performing treatments and interventions, ordering and review of laboratory studies, ordering and review of radiographic studies, pulse oximetry and re-evaluation of patient's condition.  RADIOLOGY Images were viewed by me  Chest x-ray  IMPRESSION:  No active disease.  ____________________________________________   DIFFERENTIAL DIAGNOSIS   Unstable angina, MI, arrhythmia, PE, musculoskeletal pain, GERD  FINAL ASSESSMENT AND PLAN  Chest pain, tachycardia, elevated troponin   Plan:  The patient had presented for chest pain and tachycardia. Patient's labs did reveal an elevated troponin which is likely demand related.  She was given a full dose of oral aspirin.  I did give 1 dose of IV Cardizem for rate control.  Patient's imaging did not reveal any acute process.  Given this unexplained tachycardia with elevated troponin I will discuss with the hospitalist for admission.  She appears better rate controlled at this time.   Laurence Aly, MD    Note: This note was generated in part or whole with voice recognition software. Voice recognition is usually quite accurate but there are transcription errors that can and very often do occur. I apologize for any typographical errors that were not detected and corrected.     Earleen Newport, MD 04/07/19 1225

## 2019-04-07 NOTE — ED Notes (Signed)
Pt denies CP at this time. Pt resting in bed with RR even and unlabored.

## 2019-04-07 NOTE — ED Notes (Signed)
Pt able to ambulate to toilet to void. Pt ambulated with cane. Pt back on stretcher safely and monitor in place.

## 2019-04-07 NOTE — Consult Note (Signed)
Cardiology Consult    Patient ID: [name] MRN: [id number], DOB/AGE: 1932-04-03   Admit date: 04/07/2019 Date of Consult: 04/07/2019  Primary Physician: Dierdre Harness, FNP Primary Cardiologist: Sinclair Grooms, MD Requesting Provider: Mora Bellman, MD  Patient Profile    [name] is a 86 y.o. female with a history of chest pain and prior low risk stress tests, hypertension, hyperlipidemia, stage III chronic kidney disease, systolic murmur (aortic valve sclerosis), prior stroke, diabetes, depression, anxiety, and GERD who is being seen today for the evaluation of chest pain, hypertensive urgency, and elevated high-sensitivity troponin at the request of Dr. Blaine Hamper.  Past Medical History   Past Medical History:  Diagnosis Date  . Anemia 04/03/2011  . Anginal pain ([place])    a. 02/2014 MV: Low risk; b. 05/2017 MV: small area of inferior basal ischemia. EF 54% - low risk-->Med mgmt.  Marland Kitchen Anxiety   . Aortic valve sclerosis w/ systolic murmur    a. 53/7482 AoV sclerosis w/o stenosis.  . Arthritis    "feet" (06/02/2017)  . Blind left eye 1999  . CKD (chronic kidney disease) stage 3, GFR 30-59 ml/min   . Depression   . Diastolic dysfunction    a. 10/2018 Echo: EF 60-65%, mild septal/posterior LVH. Impaired relaxation. Nl RV size/fxn. Mod dil LA. Trace TR. Triv AI. Mod AoV sclerosis w/o stenosis. Nl PASP.  Marland Kitchen Exertional dyspnea   . GERD (gastroesophageal reflux disease)   . High cholesterol   . Hip fracture ([place]) 07/17/11   fall from 1-2 feet; left  . Hypertension   . Ischemic stroke ([place]) 05/2017   a. 06/2017 Holter: RSR, PACs, no afib.  . Prosthetic eye globe 1999   "from diabetic; left eye"  . Type II diabetes mellitus ([place])    not taking any medication (06/02/2017)    Past Surgical History:  Procedure Laterality Date  . ENUCLEATION Left   . FRACTURE SURGERY    . HIP ARTHROPLASTY  07/18/2011   Procedure: ARTHROPLASTY BIPOLAR HIP;  Surgeon: Nita Sells, MD;   Location: [facility];  Service: Orthopedics;  Laterality: Left;  . INTRAOCULAR PROSTHESES INSERTION  1999   left  . TONGUE BIOPSY  1998   "cancer"  . TUBAL LIGATION  1970's     Allergies  No Known Allergies  History of Present Illness    86 year old female with the above complex past medical history including chest pain, hypertension, hyperlipidemia, stage III chronic kidney disease, systolic murmur in the setting of aortic valve sclerosis, prior stroke, diabetes, depression, anxiety, and GERD.  She is followed closely in our [facility] office in the setting of chronic angina which usually occurs at least once a week.  She has never undergone diagnostic catheterization setting of stage III chronic kidney disease and low risk stress test in 2016 and again in 2019.  In the outpatient setting, she has undergone significant titration of isosorbide therapy, most recently titrated to 120 mg in the a.m. and 60 mg in the p.m. in January of this year.  She is also required frequent titration of antihypertensives with hydralazine therapy titrated to 20 mg 3 times daily in January as well.  It has been felt that if she continues to have chest pain despite maximal titration of nitrates and potentially addition of Ranexa, that she may ultimately require diagnostic catheterization.  Patient lives by herself in [place].  She says that over the past 3 to 4 weeks, she has been experiencing more frequent episodes  of exertional chest pain associated with dyspnea, initially occurring every other day and over the past few days, occurring daily.  Historically, chest pain would occur with emotional upset or otherwise just randomly but now there does seem to be a closer tied to exertion specifically.  On March 24, she experienced "10/10" chest pain all day long.  This did not respond to sublingual nitroglycerin.  She had chest pain when she went to bed last night and was restless throughout the night.  She woke this morning  after not sleeping well and subsequently called EMS.  Upon EMS arrival, she was noted to be tachycardic at a rate of 137 bpm.  Mild inferior and anterolateral ST depression was noted.  She says she was given something via IV in the truck with some improvement in chest pain.  Upon arrival to the ED, she remained tachycardic at 135 bpm with occasional PVCs.  She was given 10 mg of diltiazem IV with improvement in rate.  Blood pressure was 190/115 on arrival and has remained elevated since.  Initial troponin 60 w/ f/u of 71.  Pt is currently c/p free.  Home Medications    Prior to Admission medications   Medication Sig Start Date End Date Taking? Authorizing Provider  acetaminophen (TYLENOL) 325 MG tablet Take 2 tablets (650 mg total) by mouth every 6 (six) hours as needed for mild pain or fever (or temp > 37.5 C (99.5 F)). 06/05/17  Yes Elgergawy, Silver Huguenin, MD  ALPHAGAN P 0.1 % SOLN Place 1 drop into the right eye 2 (two) times daily. 10/10/15  Yes [provider]  aspirin 81 MG tablet Take 81 mg by mouth daily.   Yes [provider]  atorvastatin (LIPITOR) 40 MG tablet Take 1 tablet (40 mg total) by mouth daily at 6 PM. 02/11/19  Yes Isaiah Serge, NP  carvedilol (COREG) 12.5 MG tablet Take 12.5 mg by mouth 2 (two) times daily with a meal.   Yes [provider]  folic acid (FOLVITE) 1 MG tablet Take 1 tablet (1 mg total) by mouth daily. 06/06/17  Yes Elgergawy, Silver Huguenin, MD  gabapentin (NEURONTIN) 300 MG capsule Take 300 mg by mouth 3 (three) times daily.    Yes [provider]  hydrALAZINE (APRESOLINE) 10 MG tablet Take 2 tablets (20 mg total) by mouth 3 (three) times daily. Patient taking differently: Take 10 mg by mouth 3 (three) times daily.  02/11/19  Yes Isaiah Serge, NP  isosorbide mononitrate (IMDUR) 60 MG 24 hr tablet Take 2 tablets by mouth in the a.m and take 1 tablet by mouth at bedtime Patient taking differently: Take 60 mg by mouth 2 (two) times daily.   02/11/19  Yes Isaiah Serge, NP  Multiple Vitamin (MULTIVITAMIN WITH MINERALS) TABS tablet Take 1 tablet by mouth daily. 06/06/17  Yes Elgergawy, Silver Huguenin, MD  nitroGLYCERIN (NITROSTAT) 0.4 MG SL tablet Place 1 tablet (0.4 mg total) under the tongue every 5 (five) minutes as needed for chest pain. 02/11/19 05/12/19 Yes Isaiah Serge, NP  omeprazole (PRILOSEC) 20 MG capsule Take 1 capsule (20 mg total) by mouth daily. 02/11/19  Yes Isaiah Serge, NP  TRAVATAN Z 0.004 % SOLN ophthalmic solution Place 1 drop into the right eye at bedtime.  11/24/12  Yes [provider]      Family History    Family History  Problem Relation Age of Onset  . Diabetes type II Mother   . Diabetes  type II Father   . Diabetes type II Sister   . Colon cancer Neg Hx    She indicated that her mother is deceased. She indicated that her father is deceased. She indicated that the status of her sister is unknown. She indicated that her maternal grandmother is deceased. She indicated that her maternal grandfather is deceased. She indicated that her paternal grandmother is deceased. She indicated that her paternal grandfather is deceased. She indicated that the status of her neg hx is unknown.   Social History    Social History   Socioeconomic History  . Marital status: Single    Spouse name: Not on file  . Number of children: Not on file  . Years of education: Not on file  . Highest education level: Not on file  Occupational History  . Not on file  Tobacco Use  . Smoking status: Former Smoker    Packs/day: 1.50    Years: 47.00    Pack years: 70.50    Types: Cigarettes    Quit date: 09/13/1997    Years since quitting: 21.5  . Smokeless tobacco: Never Used  Substance and Sexual Activity  . Alcohol use: Not Currently    Alcohol/week: 17.0 standard drinks    Types: 17 Shots of liquor per week    Comment: Quit 03/2018  . Drug use: No    Types: Marijuana, Heroin    Comment: 06/02/2017  "last drug use  was in the 1980s." Prev injected heroin.  Marland Kitchen Sexual activity: Not Currently  Other Topics Concern  . Not on file  Social History Narrative   Originally from [place] but moved to [facility] @ age 63.  Moved to [place] ~ 2011 to be near grandchildren.   Social Determinants of Health   Financial Resource Strain:   . Difficulty of Paying Living Expenses:   Food Insecurity:   . Worried About Charity fundraiser in the Last Year:   . Arboriculturist in the Last Year:   Transportation Needs:   . Film/video editor (Medical):   Marland Kitchen Lack of Transportation (Non-Medical):   Physical Activity:   . Days of Exercise per Week:   . Minutes of Exercise per Session:   Stress:   . Feeling of Stress :   Social Connections:   . Frequency of Communication with Friends and Family:   . Frequency of Social Gatherings with Friends and Family:   . Attends Religious Services:   . Active Member of Clubs or Organizations:   . Attends Archivist Meetings:   Marland Kitchen Marital Status:   Intimate Partner Violence:   . Fear of Current or Ex-Partner:   . Emotionally Abused:   Marland Kitchen Physically Abused:   . Sexually Abused:      Review of Systems    General:  No chills, fever, night sweats or weight changes.  Cardiovascular:  +++ chest pain, +++ dyspnea on exertion, no edema, orthopnea, palpitations, paroxysmal nocturnal dyspnea. Dermatological: No rash, lesions/masses Respiratory: No cough, +++ dyspnea Urologic: No hematuria, dysuria Abdominal:   No nausea, vomiting, diarrhea, bright red blood per rectum, melena, or hematemesis Neurologic:  No visual changes, wkns, changes in mental status. All other systems reviewed and are otherwise negative except as noted above.  Physical Exam    Blood pressure (!) 198/84, pulse 75, temperature 98.4 F (36.9 C), temperature source Oral, resp. rate 18, height 5' (1.524 m), weight 65.1 kg, SpO2 100 %.  General: Pleasant, NAD Psych:  Normal affect. Neuro: Alert and  oriented X 3. Moves all extremities spontaneously. HEENT: Normal  Neck: Supple w/o JVD.  Radiated murmur noted bilat (vs bruits). Lungs:  Resp regular and unlabored, CTA. Heart: RRR no s3, s4, 2/6 syst murmur loudest @ upper sternal borders, but heard throughout. Abdomen: Soft, non-tender, non-distended, BS + x 4.  Extremities: No clubbing, cyanosis or edema. DP/PT/Radials 2+ and equal bilaterally.  Labs    Cardiac Enzymes Recent Labs  Lab 04/07/19 1116 04/07/19 1254  TROPONINIHS 60* 71*      Lab Results  Component Value Date   WBC 7.6 04/07/2019   HGB 12.1 04/07/2019   HCT 37.2 04/07/2019   MCV 91.4 04/07/2019   PLT 229 04/07/2019    Recent Labs  Lab 04/07/19 1116  NA 140  K 4.3  CL 107  CO2 25  BUN 18  CREATININE 1.14*  CALCIUM 9.1  PROT 7.6  BILITOT 0.7  ALKPHOS 75  ALT 27  AST 47*  GLUCOSE 118*   Lab Results  Component Value Date   CHOL 141 11/02/2018   HDL 40 (L) 11/02/2018   LDLCALC 71 11/02/2018   TRIG 148 11/02/2018     Radiology Studies    DG Chest Port 1 View  Result Date: 04/07/2019 CLINICAL DATA:  Chest pain. EXAM: PORTABLE CHEST 1 VIEW COMPARISON:  January 20, 2019. FINDINGS: Stable cardiomegaly. No pneumothorax or pleural effusion is noted. Both lungs are clear. The visualized skeletal structures are unremarkable. IMPRESSION: No active disease. Electronically Signed   By: Marijo Conception M.D.   On: 04/07/2019 11:39    ECG & Cardiac Imaging    Sinus tachycardia, 135, leftward axis, LVH with repolarization abnormalities, PVCs- personally reviewed.  Assessment & Plan    1.  Unstable Angina/Demand Ischemia:  Pt w/ a long h/o chest pain and low risk stress test in 2016 and again in 2019 (mild inferior basal ischemia @ that time).  She has been managed as an outpt w/ nitrate titration and efforts to improve BP control.  Cath has historically been avoided 2/2 CKD III-IV.  She was typically experiencing c/p about once/wk, often w/ emotional upset  or even @ rest, but rarely did she note it w/ exertion previously.  At her last office visit in Jan, imdur was increased to 120mg  in the AM and 60mg  in the PM.  Over the past month, she has been experiencing more frequent bouts of exertional c/p and dyspnea, now occurring daily.  She had severe, "10/10" throughout the day on 3/24, which was followed by a restless night last night, and then ongoing c/p upon getting up this AM.  When EMS arrived, she was found to be tachycardic and hypertensive.  EMS and ED ECGs show sinus tach in the 130's with mild inferior and anterolateral ST depression in the setting of baseline LVH and repolarization abnormalities.  She was also hypertensive on arrival @ 190/115.  Symptoms improved in the ED following dilt 10mg  IV bolus and she is currently c/p free.  HsTrop initially 60  71.  Cont to cycle cardiac markers.  Hold off on heparin for the time being.  Resume home meds.  As creat is normal on admission (1.14), we may wish to pursue diagnostic catheterization - will d/w Dr. Rockey Situ.  2.  Hypertensive Urgency: BP markedly elevated on arrival @ 190/115.  She does not routinely check her bp @ home and did not take AM meds.  Resume carvedilol, hydralazine, and imdur.  Will likely require further titration of carvedilol and/or hydralazine.  3.  HL:  LDL 71 in 10/2018.  Cont statin rx.  4.  CKD III-IV:  Creat only 1.14 on admission.  Follow.  Signed, Murray Hodgkins, NP 04/07/2019, 4:09 PM  For questions or updates, please contact   Please consult [email or web address] for contact info under Cardiology/STEMI.

## 2019-04-08 ENCOUNTER — Inpatient Hospital Stay: Payer: Self-pay

## 2019-04-08 ENCOUNTER — Encounter
Admission: EM | Disposition: A | Discharge status: Home Health | Payer: Self-pay | Source: Home / Self Care | Attending: Internal Medicine

## 2019-04-08 DIAGNOSIS — E1122 Type 2 diabetes mellitus with diabetic chronic kidney disease: Secondary | ICD-10-CM | POA: Diagnosis present

## 2019-04-08 DIAGNOSIS — N170 Acute kidney failure with tubular necrosis: Secondary | ICD-10-CM | POA: Diagnosis not present

## 2019-04-08 DIAGNOSIS — G8191 Hemiplegia, unspecified affecting right dominant side: Secondary | ICD-10-CM | POA: Diagnosis not present

## 2019-04-08 DIAGNOSIS — I5032 Chronic diastolic (congestive) heart failure: Secondary | ICD-10-CM | POA: Diagnosis present

## 2019-04-08 DIAGNOSIS — D631 Anemia in chronic kidney disease: Secondary | ICD-10-CM | POA: Diagnosis present

## 2019-04-08 DIAGNOSIS — K219 Gastro-esophageal reflux disease without esophagitis: Secondary | ICD-10-CM | POA: Diagnosis present

## 2019-04-08 DIAGNOSIS — I340 Nonrheumatic mitral (valve) insufficiency: Secondary | ICD-10-CM | POA: Diagnosis not present

## 2019-04-08 DIAGNOSIS — Z20822 Contact with and (suspected) exposure to covid-19: Secondary | ICD-10-CM | POA: Diagnosis present

## 2019-04-08 DIAGNOSIS — R778 Other specified abnormalities of plasma proteins: Secondary | ICD-10-CM | POA: Diagnosis not present

## 2019-04-08 DIAGNOSIS — I351 Nonrheumatic aortic (valve) insufficiency: Secondary | ICD-10-CM | POA: Diagnosis not present

## 2019-04-08 DIAGNOSIS — I200 Unstable angina: Secondary | ICD-10-CM | POA: Diagnosis present

## 2019-04-08 DIAGNOSIS — I471 Supraventricular tachycardia: Secondary | ICD-10-CM | POA: Diagnosis present

## 2019-04-08 DIAGNOSIS — I160 Hypertensive urgency: Secondary | ICD-10-CM | POA: Diagnosis present

## 2019-04-08 DIAGNOSIS — I639 Cerebral infarction, unspecified: Secondary | ICD-10-CM | POA: Diagnosis not present

## 2019-04-08 DIAGNOSIS — M899 Disorder of bone, unspecified: Secondary | ICD-10-CM | POA: Diagnosis present

## 2019-04-08 DIAGNOSIS — I209 Angina pectoris, unspecified: Secondary | ICD-10-CM | POA: Diagnosis not present

## 2019-04-08 DIAGNOSIS — R000 Tachycardia, unspecified: Secondary | ICD-10-CM | POA: Diagnosis not present

## 2019-04-08 DIAGNOSIS — I959 Hypotension, unspecified: Secondary | ICD-10-CM | POA: Diagnosis not present

## 2019-04-08 DIAGNOSIS — I493 Ventricular premature depolarization: Secondary | ICD-10-CM | POA: Diagnosis present

## 2019-04-08 DIAGNOSIS — N179 Acute kidney failure, unspecified: Secondary | ICD-10-CM | POA: Diagnosis not present

## 2019-04-08 DIAGNOSIS — E782 Mixed hyperlipidemia: Secondary | ICD-10-CM

## 2019-04-08 DIAGNOSIS — I10 Essential (primary) hypertension: Secondary | ICD-10-CM

## 2019-04-08 DIAGNOSIS — R29705 NIHSS score 5: Secondary | ICD-10-CM | POA: Diagnosis not present

## 2019-04-08 DIAGNOSIS — I25110 Atherosclerotic heart disease of native coronary artery with unstable angina pectoris: Principal | ICD-10-CM

## 2019-04-08 DIAGNOSIS — F1010 Alcohol abuse, uncomplicated: Secondary | ICD-10-CM | POA: Diagnosis not present

## 2019-04-08 DIAGNOSIS — F1011 Alcohol abuse, in remission: Secondary | ICD-10-CM | POA: Diagnosis present

## 2019-04-08 DIAGNOSIS — E7800 Pure hypercholesterolemia, unspecified: Secondary | ICD-10-CM | POA: Diagnosis present

## 2019-04-08 DIAGNOSIS — E785 Hyperlipidemia, unspecified: Secondary | ICD-10-CM | POA: Diagnosis present

## 2019-04-08 DIAGNOSIS — I358 Other nonrheumatic aortic valve disorders: Secondary | ICD-10-CM | POA: Diagnosis present

## 2019-04-08 DIAGNOSIS — I130 Hypertensive heart and chronic kidney disease with heart failure and stage 1 through stage 4 chronic kidney disease, or unspecified chronic kidney disease: Secondary | ICD-10-CM | POA: Diagnosis present

## 2019-04-08 DIAGNOSIS — N178 Other acute kidney failure: Secondary | ICD-10-CM | POA: Diagnosis not present

## 2019-04-08 DIAGNOSIS — F419 Anxiety disorder, unspecified: Secondary | ICD-10-CM | POA: Diagnosis present

## 2019-04-08 DIAGNOSIS — N184 Chronic kidney disease, stage 4 (severe): Secondary | ICD-10-CM | POA: Diagnosis present

## 2019-04-08 DIAGNOSIS — R079 Chest pain, unspecified: Secondary | ICD-10-CM | POA: Diagnosis not present

## 2019-04-08 DIAGNOSIS — N2581 Secondary hyperparathyroidism of renal origin: Secondary | ICD-10-CM | POA: Diagnosis present

## 2019-04-08 DIAGNOSIS — I248 Other forms of acute ischemic heart disease: Secondary | ICD-10-CM | POA: Diagnosis present

## 2019-04-08 DIAGNOSIS — I2510 Atherosclerotic heart disease of native coronary artery without angina pectoris: Secondary | ICD-10-CM | POA: Diagnosis not present

## 2019-04-08 DIAGNOSIS — I361 Nonrheumatic tricuspid (valve) insufficiency: Secondary | ICD-10-CM | POA: Diagnosis not present

## 2019-04-08 HISTORY — PX: LEFT HEART CATH AND CORS/GRAFTS ANGIOGRAPHY: CATH118250

## 2019-04-08 LAB — LIPID PANEL
Cholesterol: 169 mg/dL (ref 0–200)
HDL: 47 mg/dL (ref >40)
LDL Cholesterol: 91 mg/dL (ref 0–99)
Total CHOL/HDL Ratio: 3.6 RATIO
Triglycerides: 154 mg/dL — ABNORMAL HIGH (ref <150)
VLDL: 31 mg/dL (ref 0–40)

## 2019-04-08 LAB — BASIC METABOLIC PANEL
Anion gap: 13 (ref 5–15)
BUN: 22 mg/dL (ref 8–23)
CO2: 20 mmol/L — ABNORMAL LOW (ref 22–32)
Calcium: 9.1 mg/dL (ref 8.9–10.3)
Chloride: 108 mmol/L (ref 98–111)
Creatinine, Ser: 1.64 mg/dL — ABNORMAL HIGH (ref 0.44–1.00)
GFR calc Af Amer: 33 mL/min — ABNORMAL LOW (ref >60)
GFR calc non Af Amer: 28 mL/min — ABNORMAL LOW (ref >60)
Glucose, Bld: 83 mg/dL (ref 70–99)
Potassium: 4.5 mmol/L (ref 3.5–5.1)
Sodium: 141 mmol/L (ref 135–145)

## 2019-04-08 LAB — HEMOGLOBIN A1C
Hgb A1c MFr Bld: 6.2 % — ABNORMAL HIGH (ref 4.8–5.6)
Mean Plasma Glucose: 131.24 mg/dL

## 2019-04-08 LAB — GLUCOSE, CAPILLARY
Glucose-Capillary: 71 mg/dL (ref 70–99)
Glucose-Capillary: 89 mg/dL (ref 70–99)
Glucose-Capillary: 80 mg/dL (ref 70–99)

## 2019-04-08 SURGERY — LEFT HEART CATH AND CORS/GRAFTS ANGIOGRAPHY
Anesthesia: Moderate Sedation

## 2019-04-08 MED ORDER — HYDRALAZINE HCL 20 MG/ML IJ SOLN: 10.0000 mg | INTRAMUSCULAR | Status: AC | PRN

## 2019-04-08 MED ORDER — MIDAZOLAM HCL 2 MG/2ML IJ SOLN
INTRAMUSCULAR | Status: DC | PRN
  Administered 2019-04-08: 1 mg

## 2019-04-08 MED ORDER — LABETALOL HCL 5 MG/ML IV SOLN: 10.0000 mg | INTRAVENOUS | Status: AC | PRN

## 2019-04-08 MED ORDER — METOPROLOL TARTRATE 5 MG/5ML IV SOLN
INTRAVENOUS | Status: AC
  Filled 2019-04-08: qty 5

## 2019-04-08 MED ORDER — CARVEDILOL 25 MG PO TABS
25.0000 mg | ORAL_TABLET | Freq: Two times a day (BID) | ORAL | Status: DC
  Administered 2019-04-08 – 2019-04-13 (×10): 25 mg via ORAL
  Filled 2019-04-08 (×11): qty 1

## 2019-04-08 MED ORDER — FENTANYL CITRATE (PF) 100 MCG/2ML IJ SOLN
INTRAMUSCULAR | Status: AC
  Filled 2019-04-08: qty 2

## 2019-04-08 MED ORDER — DEXTROSE 50 % IV SOLN: 12.5000 g | Freq: Once | INTRAVENOUS | Status: AC

## 2019-04-08 MED ORDER — SODIUM CHLORIDE 0.9% FLUSH
10.0000 mL | Freq: Two times a day (BID) | INTRAVENOUS | Status: DC
  Administered 2019-04-08 – 2019-04-13 (×7): 10 mL

## 2019-04-08 MED ORDER — FENTANYL CITRATE (PF) 100 MCG/2ML IJ SOLN
INTRAMUSCULAR | Status: DC | PRN
  Administered 2019-04-08: 25 ug

## 2019-04-08 MED ORDER — SODIUM CHLORIDE 0.9% FLUSH: 10.0000 mL | INTRAVENOUS | Status: DC | PRN

## 2019-04-08 MED ORDER — METOPROLOL TARTRATE 5 MG/5ML IV SOLN
INTRAVENOUS | Status: DC | PRN
  Administered 2019-04-08 (×2): 2.5 mg via INTRAVENOUS

## 2019-04-08 MED ORDER — ACETAMINOPHEN 325 MG PO TABS: 650.0000 mg | ORAL_TABLET | ORAL | Status: DC | PRN

## 2019-04-08 MED ORDER — ONDANSETRON HCL 4 MG/2ML IJ SOLN: 4.0000 mg | Freq: Four times a day (QID) | INTRAMUSCULAR | Status: DC | PRN

## 2019-04-08 MED ORDER — SODIUM CHLORIDE 0.9% FLUSH
3.0000 mL | Freq: Two times a day (BID) | INTRAVENOUS | Status: DC
  Administered 2019-04-08 – 2019-04-11 (×4): 3 mL via INTRAVENOUS

## 2019-04-08 MED ORDER — SODIUM CHLORIDE 0.9 % IV SOLN
250.0000 mL | INTRAVENOUS | Status: DC | PRN
  Administered 2019-04-12 – 2019-04-13 (×2): 250 mL via INTRAVENOUS

## 2019-04-08 MED ORDER — HYDRALAZINE HCL 20 MG/ML IJ SOLN
INTRAMUSCULAR | Status: AC
  Filled 2019-04-08: qty 1

## 2019-04-08 MED ORDER — MIDAZOLAM HCL 2 MG/2ML IJ SOLN
INTRAMUSCULAR | Status: AC
  Filled 2019-04-08: qty 2

## 2019-04-08 MED ORDER — DEXTROSE 50 % IV SOLN
INTRAVENOUS | Status: AC
  Administered 2019-04-08: 12.5 g via INTRAVENOUS
  Filled 2019-04-08: qty 50

## 2019-04-08 MED ORDER — SODIUM CHLORIDE 0.9 % WEIGHT BASED INFUSION: 1.0000 mL/kg/h | INTRAVENOUS | Status: AC

## 2019-04-08 MED ORDER — SODIUM CHLORIDE 0.9% FLUSH: 3.0000 mL | INTRAVENOUS | Status: DC | PRN

## 2019-04-08 MED ORDER — HEPARIN (PORCINE) IN NACL 1000-0.9 UT/500ML-% IV SOLN
INTRAVENOUS | Status: AC
  Filled 2019-04-08: qty 1000

## 2019-04-08 MED ORDER — HYDRALAZINE HCL 20 MG/ML IJ SOLN
INTRAMUSCULAR | Status: DC | PRN
  Administered 2019-04-08: 10 mg via INTRAVENOUS

## 2019-04-08 SURGICAL SUPPLY — 14 items
CATH AMP RT 5F (CATHETERS) ×1 IMPLANT
CATH INFINITI 5 FR 3DRC (CATHETERS) ×1 IMPLANT
CATH INFINITI 5FR ANG PIGTAIL (CATHETERS) ×1 IMPLANT
CATH INFINITI 5FR JL4 (CATHETERS) ×1 IMPLANT
CATH INFINITI JR4 5F (CATHETERS) ×1 IMPLANT
DEVICE CLOSURE MYNXGRIP 5F (Vascular Products) ×1 IMPLANT
KIT MANI 3VAL PERCEP (MISCELLANEOUS) ×2 IMPLANT
NDL PERC 18GX7CM (NEEDLE) IMPLANT
NEEDLE PERC 18GX7CM (NEEDLE) ×2 IMPLANT
PACK CARDIAC CATH (CUSTOM PROCEDURE TRAY) ×2 IMPLANT
SHEATH AVANTI 5FR X 11CM (SHEATH) ×2 IMPLANT
SUT SILK 0 FSL (SUTURE) ×1 IMPLANT
WIRE GUIDERIGHT .035X150 (WIRE) ×1 IMPLANT
WIRE HITORQ VERSACORE ST 145CM (WIRE) ×1 IMPLANT

## 2019-04-08 NOTE — Progress Notes (Signed)
Progress Note  Patient Name: [name] Date of Encounter: 04/08/2019  Primary Cardiologist: Sinclair Grooms, MD   Subjective   Continued episodes of atrial tachycardia Heart rate up to 130s Lopressor 2.5 mg IV given twice during catheterization procedure, broke to normal sinus rhythm Catheterization with occluded RCA collaterals left to right  Inpatient Medications    Scheduled Meds: . [MAR Hold] aspirin EC  81 mg Oral Daily  . [MAR Hold] atorvastatin  40 mg Oral q1800  . [MAR Hold] brimonidine  1 drop Right Eye BID  . carvedilol  25 mg Oral BID WC  . [MAR Hold] folic acid  1 mg Oral Daily  . [MAR Hold] gabapentin  300 mg Oral TID  . [MAR Hold] heparin  5,000 Units Subcutaneous Q8H  . [MAR Hold] isosorbide mononitrate  60 mg Oral BID  . [MAR Hold] latanoprost  1 drop Right Eye QHS  . [MAR Hold] multivitamin with minerals  1 tablet Oral Daily  . [MAR Hold] pantoprazole  40 mg Oral Daily  . [MAR Hold] sodium chloride flush  3 mL Intravenous Q12H  . [MAR Hold] thiamine  100 mg Oral Daily   Or  . [MAR Hold] thiamine  100 mg Intravenous Daily   Continuous Infusions: . sodium chloride    . sodium chloride 75 mL/hr at 04/08/19 0416   PRN Meds: sodium chloride, [MAR Hold] acetaminophen, fentaNYL, [MAR Hold] hydrALAZINE, hydrALAZINE, metoprolol tartrate, midazolam, [MAR Hold]  morphine injection, [MAR Hold] nitroGLYCERIN, [MAR Hold] ondansetron (ZOFRAN) IV, sodium chloride flush   Vital Signs    Vitals:   04/08/19 0400 04/08/19 0500 04/08/19 0731 04/08/19 0918  BP:   135/70 (!) 163/88  Pulse:   74 84  Resp: 15 18 16 19   Temp:   98.9 F (37.2 C) 98.6 F (37 C)  TempSrc:   Oral Oral  SpO2:   93% 98%  Weight:    65.1 kg  Height:    5' (1.524 m)    Intake/Output Summary (Last 24 hours) at 04/08/2019 1259 Last data filed at 04/08/2019 0121 Gross per 24 hour  Intake --  Output 375 ml  Net -375 ml   Last 3 Weights 04/08/2019 04/07/2019 04/07/2019  Weight (lbs)  143 lb 8.3 oz 143 lb 8 oz 135 lb  Weight (kg) 65.1 kg 65.091 kg 61.236 kg      Telemetry    Atrial tachycardia episodes, otherwise normal sinus rhythm- Personally Reviewed  ECG     - Personally Reviewed  Physical Exam   GEN: No acute distress.   Neck: No JVD Cardiac: RRR, no murmurs, rubs, or gallops.  Respiratory: Clear to auscultation bilaterally. GI: Soft, nontender, non-distended  MS: No edema; No deformity. Neuro:  Nonfocal  Psych: Normal affect   Labs    High Sensitivity Troponin:   Recent Labs  Lab 04/07/19 1116 04/07/19 1254 04/07/19 1622 04/07/19 1901  TROPONINIHS 60* 71* 88* 81*      Chemistry Recent Labs  Lab 04/07/19 1116 04/08/19 0437  NA 140 141  K 4.3 4.5  CL 107 108  CO2 25 20*  GLUCOSE 118* 83  BUN 18 22  CREATININE 1.14* 1.64*  CALCIUM 9.1 9.1  PROT 7.6  --   ALBUMIN 3.6  --   AST 47*  --   ALT 27  --   ALKPHOS 75  --   BILITOT 0.7  --   GFRNONAA 44* 28*  GFRAA 51* 33*  ANIONGAP 8 13  Hematology Recent Labs  Lab 04/07/19 1116  WBC 7.6  RBC 4.07  HGB 12.1  HCT 37.2  MCV 91.4  MCH 29.7  MCHC 32.5  RDW 14.5  PLT 229    BNP Recent Labs  Lab 04/07/19 1307  BNP 1,028.0*     DDimer No results for input(s): DDIMER in the last 168 hours.   Radiology    CARDIAC CATHETERIZATION  Result Date: 04/08/2019  Ost RCA to Mid RCA lesion is 100% stenosed.  Prox Cx lesion is 30% stenosed.  Mid LAD lesion is 30% stenosed.    DG Chest Port 1 View  Result Date: 04/07/2019 CLINICAL DATA:  Chest pain. EXAM: PORTABLE CHEST 1 VIEW COMPARISON:  January 20, 2019. FINDINGS: Stable cardiomegaly. No pneumothorax or pleural effusion is noted. Both lungs are clear. The visualized skeletal structures are unremarkable. IMPRESSION: No active disease. Electronically Signed   By: Marijo Conception M.D.   On: 04/07/2019 11:39    Cardiac Studies   Echocardiogram October 2020 ejection fraction 60 to 65%  Cardiac catheterization performed  today April 08, 2019 with occluded RCA, collaterals left to right  Patient Profile     86 year old woman followed by cardiology in [place], history of chronic chest pain, hypertension, hyperlipidemia, stage III kidney disease, diabetes, presenting with chest pain, hypertensive urgency, elevated troponin  Assessment & Plan    Unstable angina Cardiac catheterization today occluded RCA collaterals left to right, medical management recommended -Likely having worsening anginal symptoms with her episodes of atrial tachycardia rate 130 bpm or higher -We will look to advance her beta-blocker, Coreg to 25 twice daily -Also has labile pressure in the setting of anxiety, blood pressure close to 200 at the beginning of the case, improved with sedation down into the 140s -Needs aspirin, statin, high-dose beta-blocker  Atrial tachycardia Likely causing her episodic chest pain as outpatient, unstable angina -We will try to advance her beta-blocker, If unable to control her symptoms, she may need antiarrhythmics -For continued symptoms may need a Zio monitor as outpatient  Chronic kidney disease stage IIIa Creatinine down to 1.14, GFR 30 We will need to hydrate overnight, repeat BMP tomorrow  Labile hypertension/hypertensive urgency Continue carvedilol 25 twice daily with Imdur Could restart hydralazine as needed for systolic pressures over 301S   Total encounter time more than 25 minutes  Greater than 50% was spent in counseling and coordination of care with the patient   For questions or updates, please contact [facility] Please consult [email or web address] for contact info under        Signed, Ida Rogue, MD  04/08/2019, 12:59 PM

## 2019-04-08 NOTE — Progress Notes (Signed)
PROGRESS NOTE    [name]  BTD:[id number] DOB: 10/11/1932 DOA: 04/07/2019 PCP: Dierdre Harness, FNP   Brief Narrative:  HPI: [name] is a 86 y.o. female with medical history significant of hypertension, hyperlipidemia, diet-controlled diabetes, stroke, GERD, depression, anxiety, left eye blindness, alcohol abuse in remission, CKD stage III, dCHF, who presents with chest pain.  Patient states that she started having chest pain since yesterday.  She still has some chest pain earlier today, currently chest pain-free.  She states that the chest pain is located in substernal area, pressure-like, moderate, nonradiating.  Associated with mild shortness of breath.  She also has mild dry cough.  No fever or chills.  Patient denies nausea, vomiting, diarrhea, abdominal pain, symptoms of UTI or unilateral weakness.  No recent fall.  No dark stool.  Patient states that she quit drinking alcohol for more than 6 months.  Patient initially had sinus tachycardia with heart rate of 132, which improved to 71 after received 10 mg of Cardizem by IV in ED.  3/26: Patient underwent cardiac catheterization today.  Case discussed with Dr. Rockey Situ.  Catheterization had occluded RCA with collaterals left to right.  Multiple episodes of atrial tachycardia during catheterization.  Lopressor 2.5 mg IV given twice during cath.  Both normal sinus rhythm.  Per cardiology this is likely the underlying factor the patient's symptoms.  Patient has very difficult IV access.  Multiple providers failed for peripheral IV access.  Midline attempts failed.  Placing PICC line as patient requires stable IV access prior to leaving specials recovery.   Assessment & Plan:   Principal Problem:   Chest pain Active Problems:   Chronic diastolic heart failure (HCC)   Ischemic stroke (HCC)   Alcohol abuse   GERD without esophagitis   CKD (chronic kidney disease), stage IV (HCC)   HTN (hypertension)   HLD  (hyperlipidemia)   Elevated troponin   Unstable angina (HCC)  Unstable angina Status post cardiac catheterization Occluded RCA, collaterals left to right Recommending medical management Per cardiology worsening anginal symptoms likely secondary to atrial tachycardia Plan: Continue beta-blocker, Coreg 25 mg daily Aspirin 81 mg daily High intensity statin  Atrial tachycardia Likely driving her episodic chest pain and unstable angina Beta-blocker advance per cardiology  Chronic kidney disease stage IIIa Creatinine 1.14 IV fluid hydration overnight Repeat creatinine in the morning  Hypertensive urgency Coreg 25 mg daily Imdur Hydralazine as needed  History of CVA Continue aspirin and Lipitor  History of alcohol abuse Patient endorses cessation  GERD On Protonix  Difficult IV access Multiple attempts by multiple different providers were unsuccessful Eventually midline was successful Midline in place in left upper extremity   DVT prophylaxis: Heparin Code Status: Full Family Communication: None today Disposition Plan: Anticipate return to previous home environment.  Possible discharge on 04/09/2019.  Patient underwent cardiac catheterization today.  Will need confirmation of hemodynamic stability as patient had episodes of hypertensive urgency as well as atrial tachycardia during catheterization.  If hemodynamics have improved we can consider discharge on 04/09/2019  Consultants:  Cardiology-[facility] Procedures:   Left heart catheterization-04/08/2019  Antimicrobials:  none   Subjective: Patient seen and examined in specials recovery Mentating well Vitals within normal limits   Objective: Vitals:   04/08/19 1255 04/08/19 1300 04/08/19 1315 04/08/19 1330  BP: 133/73 132/61 (!) 161/74 (!) 167/69  Pulse: 83 82 82 83  Resp: 17 13 15 13   Temp:      TempSrc:      SpO2:  100% 100% 93% 92%  Weight:      Height:        Intake/Output Summary (Last 24 hours) at  04/08/2019 1416 Last data filed at 04/08/2019 0121 Gross per 24 hour  Intake --  Output 375 ml  Net -375 ml   Filed Weights   04/07/19 1116 04/07/19 1556 04/08/19 0918  Weight: 61.2 kg 65.1 kg 65.1 kg    Examination:  General exam: Appears calm and comfortable, left eye blindness Respiratory system: Clear to auscultation. Respiratory effort normal. Cardiovascular system: S1 & S2 heard, RRR. No JVD, murmurs, rubs, gallops or clicks. No pedal edema. Gastrointestinal system: Abdomen is nondistended, soft and nontender. No organomegaly or masses felt. Normal bowel sounds heard. Central nervous system: Alert and oriented. No focal neurological deficits. Extremities: Symmetric 5 x 5 power. Skin: No rashes, lesions or ulcers Psychiatry: Judgement and insight appear normal. Mood & affect appropriate.     Data Reviewed: I have personally reviewed following labs and imaging studies  CBC: Recent Labs  Lab 04/07/19 1116  WBC 7.6  HGB 12.1  HCT 37.2  MCV 91.4  PLT 010   Basic Metabolic Panel: Recent Labs  Lab 04/07/19 1116 04/08/19 0437  NA 140 141  K 4.3 4.5  CL 107 108  CO2 25 20*  GLUCOSE 118* 83  BUN 18 22  CREATININE 1.14* 1.64*  CALCIUM 9.1 9.1   GFR: Estimated Creatinine Clearance: 21.5 mL/min (A) (by C-G formula based on SCr of 1.64 mg/dL (H)). Liver Function Tests: Recent Labs  Lab 04/07/19 1116  AST 47*  ALT 27  ALKPHOS 75  BILITOT 0.7  PROT 7.6  ALBUMIN 3.6   No results for input(s): LIPASE, AMYLASE in the last 168 hours. No results for input(s): AMMONIA in the last 168 hours. Coagulation Profile: No results for input(s): INR, PROTIME in the last 168 hours. Cardiac Enzymes: No results for input(s): CKTOTAL, CKMB, CKMBINDEX, TROPONINI in the last 168 hours. BNP (last 3 results) Recent Labs    11/10/18 1516  PROBNP 924*   HbA1C: Recent Labs    04/08/19 0437  HGBA1C 6.2*   CBG: Recent Labs  Lab 04/08/19 0927 04/08/19 1036 04/08/19 1300   GLUCAP 71 89 80   Lipid Profile: Recent Labs    04/08/19 0437  CHOL 169  HDL 47  LDLCALC 91  TRIG 154*  CHOLHDL 3.6   Thyroid Function Tests: No results for input(s): TSH, T4TOTAL, FREET4, T3FREE, THYROIDAB in the last 72 hours. Anemia Panel: No results for input(s): VITAMINB12, FOLATE, FERRITIN, TIBC, IRON, RETICCTPCT in the last 72 hours. Sepsis Labs: No results for input(s): PROCALCITON, LATICACIDVEN in the last 168 hours.  Recent Results (from the past 240 hour(s))  Respiratory Panel by RT PCR (Flu A&B, Covid) - Nasopharyngeal Swab     Status: None   Collection Time: 04/07/19 12:54 PM   Specimen: Nasopharyngeal Swab  Result Value Ref Range Status   SARS Coronavirus 2 by RT PCR NEGATIVE NEGATIVE Final    Comment: (NOTE) SARS-CoV-2 target nucleic acids are NOT DETECTED. The SARS-CoV-2 RNA is generally detectable in upper respiratoy specimens during the acute phase of infection. The lowest concentration of SARS-CoV-2 viral copies this assay can detect is 131 copies/mL. A negative result does not preclude SARS-Cov-2 infection and should not be used as the sole basis for treatment or other patient management decisions. A negative result may occur with  improper specimen collection/handling, submission of specimen other than nasopharyngeal swab, presence of viral mutation(s)  within the areas targeted by this assay, and inadequate number of viral copies (<131 copies/mL). A negative result must be combined with clinical observations, patient history, and epidemiological information. The expected result is Negative. Fact Sheet for Patients:  PinkCheek.be Fact Sheet for Healthcare Providers:  GravelBags.it This test is not yet ap proved or cleared by the [place] FDA and  has been authorized for detection and/or diagnosis of SARS-CoV-2 by FDA under an Emergency Use Authorization (EUA). This EUA will remain  in  effect (meaning this test can be used) for the duration of the COVID-19 declaration under Section 564(b)(1) of the Act, 21 U.S.C. section 360bbb-3(b)(1), unless the authorization is terminated or revoked sooner.    Influenza A by PCR NEGATIVE NEGATIVE Final   Influenza B by PCR NEGATIVE NEGATIVE Final    Comment: (NOTE) The Xpert Xpress SARS-CoV-2/FLU/RSV assay is intended as an aid in  the diagnosis of influenza from Nasopharyngeal swab specimens and  should not be used as a sole basis for treatment. Nasal washings and  aspirates are unacceptable for Xpert Xpress SARS-CoV-2/FLU/RSV  testing. Fact Sheet for Patients: PinkCheek.be Fact Sheet for Healthcare Providers: GravelBags.it This test is not yet approved or cleared by the [place] FDA and  has been authorized for detection and/or diagnosis of SARS-CoV-2 by  FDA under an Emergency Use Authorization (EUA). This EUA will remain  in effect (meaning this test can be used) for the duration of the  Covid-19 declaration under Section 564(b)(1) of the Act, 21  U.S.C. section 360bbb-3(b)(1), unless the authorization is  terminated or revoked. Performed at [facility], [address], [place], [place] [address]          Radiology Studies: CARDIAC CATHETERIZATION  Result Date: 04/08/2019  Colon Flattery RCA to Mid RCA lesion is 100% stenosed.  Prox Cx lesion is 30% stenosed.  Mid LAD lesion is 30% stenosed.    DG Chest Port 1 View  Result Date: 04/07/2019 CLINICAL DATA:  Chest pain. EXAM: PORTABLE CHEST 1 VIEW COMPARISON:  January 20, 2019. FINDINGS: Stable cardiomegaly. No pneumothorax or pleural effusion is noted. Both lungs are clear. The visualized skeletal structures are unremarkable. IMPRESSION: No active disease. Electronically Signed   By: Marijo Conception M.D.   On: 04/07/2019 11:39   [place] EKG SITE RITE  Result Date: 04/08/2019 If Site Rite image not attached,  placement could not be confirmed due to current cardiac rhythm.       Scheduled Meds: . [MAR Hold] aspirin EC  81 mg Oral Daily  . [MAR Hold] atorvastatin  40 mg Oral q1800  . [MAR Hold] brimonidine  1 drop Right Eye BID  . carvedilol  25 mg Oral BID WC  . [MAR Hold] folic acid  1 mg Oral Daily  . [MAR Hold] gabapentin  300 mg Oral TID  . [MAR Hold] heparin  5,000 Units Subcutaneous Q8H  . [MAR Hold] isosorbide mononitrate  60 mg Oral BID  . [MAR Hold] latanoprost  1 drop Right Eye QHS  . [MAR Hold] multivitamin with minerals  1 tablet Oral Daily  . [MAR Hold] pantoprazole  40 mg Oral Daily  . [MAR Hold] sodium chloride flush  3 mL Intravenous Q12H  . sodium chloride flush  3 mL Intravenous Q12H  . [MAR Hold] thiamine  100 mg Oral Daily   Or  . [MAR Hold] thiamine  100 mg Intravenous Daily   Continuous Infusions: . sodium chloride    . sodium chloride 75  mL/hr at 04/08/19 0416  . sodium chloride    . sodium chloride       LOS: 0 days    Time spent: 35 minutes    Sidney Ace, MD [facility] Pager [phone]  If 7PM-7AM, please contact night-coverage 04/08/2019, 2:16 PM

## 2019-04-09 ENCOUNTER — Inpatient Hospital Stay: Payer: Medicare Other

## 2019-04-09 DIAGNOSIS — N178 Other acute kidney failure: Secondary | ICD-10-CM

## 2019-04-09 DIAGNOSIS — I209 Angina pectoris, unspecified: Secondary | ICD-10-CM

## 2019-04-09 LAB — BASIC METABOLIC PANEL
Anion gap: 9 (ref 5–15)
BUN: 29 mg/dL — ABNORMAL HIGH (ref 8–23)
CO2: 23 mmol/L (ref 22–32)
Calcium: 8.5 mg/dL — ABNORMAL LOW (ref 8.9–10.3)
Chloride: 109 mmol/L (ref 98–111)
Creatinine, Ser: 1.91 mg/dL — ABNORMAL HIGH (ref 0.44–1.00)
GFR calc Af Amer: 27 mL/min — ABNORMAL LOW (ref >60)
GFR calc non Af Amer: 24 mL/min — ABNORMAL LOW (ref >60)
Glucose, Bld: 71 mg/dL (ref 70–99)
Potassium: 4.4 mmol/L (ref 3.5–5.1)
Sodium: 141 mmol/L (ref 135–145)

## 2019-04-09 LAB — GLUCOSE, CAPILLARY: Glucose-Capillary: 108 mg/dL — ABNORMAL HIGH (ref 70–99)

## 2019-04-09 MED ORDER — SODIUM CHLORIDE 0.9 % IV BOLUS: 500.0000 mL | Freq: Once | INTRAVENOUS | Status: DC

## 2019-04-09 MED ORDER — SODIUM CHLORIDE 0.9 % IV SOLN: INTRAVENOUS | Status: AC

## 2019-04-09 NOTE — Consult Note (Signed)
ELECTROPHYSIOLOGY CONSULT NOTE    Primary Care Physician: Dierdre Harness, FNP Referring Physician:  Dr Rockey Situ  Admit Date: 04/07/2019  Reason for consultation: SVT  [name] is a 86 y.o. female with a h/o diastolic dysfunction, CRI, and  Diabetes who is admitted with chest pain.  She is found to have episodes of SVT with heart rates 130s.  She underwent cath which revealed chronically occluded RCA for which medical therapy was advised.  Her coreg has been increased to 25mg  BID.    Today, she denies symptoms of palpitations, chest pain, shortness of breath, orthopnea, PND, lower extremity edema, dizziness, presyncope, syncope, or neurologic sequela. The patient is tolerating medications without difficulties and is otherwise without complaint today.   Past Medical History:  Diagnosis Date  . Anemia 04/03/2011  . Anginal pain ([place])    a. 02/2014 MV: Low risk; b. 05/2017 MV: small area of inferior basal ischemia. EF 54% - low risk-->Med mgmt.  Marland Kitchen Anxiety   . Aortic valve sclerosis w/ systolic murmur    a. 85/8850 AoV sclerosis w/o stenosis.  . Arthritis    "feet" (06/02/2017)  . Blind left eye 1999  . CKD (chronic kidney disease) stage 3, GFR 30-59 ml/min   . Depression   . Diastolic dysfunction    a. 10/2018 Echo: EF 60-65%, mild septal/posterior LVH. Impaired relaxation. Nl RV size/fxn. Mod dil LA. Trace TR. Triv AI. Mod AoV sclerosis w/o stenosis. Nl PASP.  Marland Kitchen Exertional dyspnea   . GERD (gastroesophageal reflux disease)   . High cholesterol   . Hip fracture ([place]) 07/17/11   fall from 1-2 feet; left  . Hypertension   . Ischemic stroke ([place]) 05/2017   a. 06/2017 Holter: RSR, PACs, no afib.  . Prosthetic eye globe 1999   "from diabetic; left eye"  . Type II diabetes mellitus ([place])    not taking any medication (06/02/2017)   Past Surgical History:  Procedure Laterality Date  . ENUCLEATION Left   . FRACTURE SURGERY    . HIP ARTHROPLASTY  07/18/2011   Procedure: ARTHROPLASTY  BIPOLAR HIP;  Surgeon: Nita Sells, MD;  Location: [place];  Service: Orthopedics;  Laterality: Left;  . INTRAOCULAR PROSTHESES INSERTION  1999   left  . TONGUE BIOPSY  1998   "cancer"  . TUBAL LIGATION  1970's    . aspirin EC  81 mg Oral Daily  . atorvastatin  40 mg Oral q1800  . brimonidine  1 drop Right Eye BID  . carvedilol  25 mg Oral BID WC  . folic acid  1 mg Oral Daily  . gabapentin  300 mg Oral TID  . heparin  5,000 Units Subcutaneous Q8H  . isosorbide mononitrate  60 mg Oral BID  . latanoprost  1 drop Right Eye QHS  . multivitamin with minerals  1 tablet Oral Daily  . pantoprazole  40 mg Oral Daily  . sodium chloride flush  10-40 mL Intracatheter Q12H  . sodium chloride flush  3 mL Intravenous Q12H  . sodium chloride flush  3 mL Intravenous Q12H  . thiamine  100 mg Oral Daily   Or  . thiamine  100 mg Intravenous Daily   . sodium chloride    . sodium chloride 75 mL/hr at 04/09/19 0847    No Known Allergies  Social History   Socioeconomic History  . Marital status: Single    Spouse name: Not on file  . Number of children: Not on file  . Years  of education: Not on file  . Highest education level: Not on file  Occupational History  . Not on file  Tobacco Use  . Smoking status: Former Smoker    Packs/day: 1.50    Years: 47.00    Pack years: 70.50    Types: Cigarettes    Quit date: 09/13/1997    Years since quitting: 21.5  . Smokeless tobacco: Never Used  Substance and Sexual Activity  . Alcohol use: Not Currently    Alcohol/week: 17.0 standard drinks    Types: 17 Shots of liquor per week    Comment: Quit 03/2018  . Drug use: No    Types: Marijuana, Heroin    Comment: 06/02/2017  "last drug use was in the 1980s." Prev injected heroin.  Marland Kitchen Sexual activity: Not Currently  Other Topics Concern  . Not on file  Social History Narrative   Originally from [place] but moved to [facility] @ age 61.  Moved to [place] ~ 2011 to be near grandchildren.    Social Determinants of Health   Financial Resource Strain:   . Difficulty of Paying Living Expenses:   Food Insecurity:   . Worried About Charity fundraiser in the Last Year:   . Arboriculturist in the Last Year:   Transportation Needs:   . Film/video editor (Medical):   Marland Kitchen Lack of Transportation (Non-Medical):   Physical Activity:   . Days of Exercise per Week:   . Minutes of Exercise per Session:   Stress:   . Feeling of Stress :   Social Connections:   . Frequency of Communication with Friends and Family:   . Frequency of Social Gatherings with Friends and Family:   . Attends Religious Services:   . Active Member of Clubs or Organizations:   . Attends Archivist Meetings:   Marland Kitchen Marital Status:   Intimate Partner Violence:   . Fear of Current or Ex-Partner:   . Emotionally Abused:   Marland Kitchen Physically Abused:   . Sexually Abused:     Family History  Problem Relation Age of Onset  . Diabetes type II Mother   . Diabetes type II Father   . Diabetes type II Sister   . Colon cancer Neg Hx     ROS- All systems are reviewed and negative except as per the HPI above  Physical Exam: Telemetry: episodes of SVT are noted, currently in sinus rhythm Vitals:   04/09/19 0436 04/09/19 0500 04/09/19 0747 04/09/19 1126  BP: (!) 136/48  (!) 152/72 (!) 107/46  Pulse: 67  78 65  Resp: 20 14 18 17   Temp: 98 F (36.7 C)  98.8 F (37.1 C) 98.2 F (36.8 C)  TempSrc: Oral  Oral Oral  SpO2: 97%  95% 97%  Weight:      Height:        GEN- The patient is elderly appearing, alert and oriented x 3 today.   Head- normocephalic, atraumatic Eyes-  Sclera clear, conjunctiva pink Ears- hearing intact Oropharynx- clear Neck- supple, no JVP Lungs-   normal work of breathing Heart- Regular rate and rhythm  GI- soft  Extremities- no clubbing, cyanosis, or edema MS- diffuse atrophy Skin- no rash or lesion Psych- euthymic mood, full affect Neuro- strength and sensation are  intact  EKG on admission reveals long RP SVT at 135 bpm ekg 04/08/19- sinus rhythm with LVH, nonspecific ST/T changes  Labs:   Lab Results  Component Value Date   WBC  7.6 04/07/2019   HGB 12.1 04/07/2019   HCT 37.2 04/07/2019   MCV 91.4 04/07/2019   PLT 229 04/07/2019    Recent Labs  Lab 04/07/19 1116 04/08/19 0437 04/09/19 0457  NA 140   < > 141  K 4.3   < > 4.4  CL 107   < > 109  CO2 25   < > 23  BUN 18   < > 29*  CREATININE 1.14*   < > 1.91*  CALCIUM 9.1   < > 8.5*  PROT 7.6  --   --   BILITOT 0.7  --   --   ALKPHOS 75  --   --   ALT 27  --   --   AST 47*  --   --   GLUCOSE 118*   < > 71   < > = values in this interval not displayed.   Lab Results  Component Value Date   CKTOTAL 65 10/12/2009   CKMB 0.7 10/12/2009   TROPONINI <0.03 02/21/2018    Lab Results  Component Value Date   CHOL 169 04/08/2019   CHOL 141 11/02/2018   CHOL 154 06/03/2017   Lab Results  Component Value Date   HDL 47 04/08/2019   HDL 40 (L) 11/02/2018   HDL 56 06/03/2017   Echo:  11/02/18- EF 60%, moderate LA enlargement  ASSESSMENT AND PLAN  1. SVT I agree with Dr Rockey Situ that this is likely atach. She continues to have occasional episodes.  Coreg has been increased.  We can consider adding amiodarone.  She is at increased risk for ablation, though we could consider this if necessary after failing AAD therapy. No changes at this time.  2. Angina Continue current medical therapy  3. Acute on chronic renal failure S/p cath Continue IV hydration  4. Hypertensive urgency Coreg increased  If renal failure improves and SVT/ angina remain controlled, could discharge soon with close outpatient follow-up     Thompson Grayer, MD 04/09/2019  11:44 AM

## 2019-04-09 NOTE — Evaluation (Signed)
Physical Therapy Evaluation Patient Details Name: Keeghan Mcintire MRN: [id number] DOB: 21-Mar-1932 Today's Date: 04/09/2019   History of Present Illness  presented to ER secondary to substernal chest pain, SOB; admitted for management of chest pain with elevated troponin.  s/p cardiac cath 3/26 significant for occluded RCA with collaterals L to R, recommended for medical management.  Clinical Impression  Upon evaluation, patient alert and oriented to basic information; follows simple commands with increased time for processing and task initiation. Possible intermittent word-finding difficulty noted during session.  Generalized weakness noted throughout R UE, associated with reported sensory deficit, moderate fine motor control/coordination deficits and positive drift.  Mild weakness R LE (4-/5), but less pronounced than R UE.  L UE/LE grossly WFL.  Currently requiring min assist for bed mobility; min assist for sit/stand, basic transfers and gait (200') with RW.  Demonstrates reciprocal stepping pattern, mildly antalgic; slow and guarded.  Walker consistent veering L with decreased visual attention to R.  Cuing for R UE placement/grasp on walker at times, as R UE tends to drift off with divided attention and fatigue. Would benefit from skilled PT to address above deficits and promote optimal return to PLOF.; Recommend transition to HHPT with 24/7 supervision/assist upon discharge from acute hospitalization.     Follow Up Recommendations Home health PT;Supervision/Assistance - 24 hour    Equipment Recommendations  Rolling walker with 5" wheels;3in1 (PT)    Recommendations for Other Services       Precautions / Restrictions Precautions Precautions: Fall Restrictions Weight Bearing Restrictions: No      Mobility  Bed Mobility Overal bed mobility: Needs Assistance Bed Mobility: Supine to Sit     Supine to sit: Min assist        Transfers Overall transfer level: Needs  assistance Equipment used: Rolling walker (2 wheeled) Transfers: Sit to/from Stand Sit to Stand: Min assist         General transfer comment: cuing for hand placement, tends to pull on RW  Ambulation/Gait Ambulation/Gait assistance: Min assist Gait Distance (Feet): 200 Feet Assistive device: Rolling walker (2 wheeled)   Gait velocity: 10' walk time, 14-15 seconds Gait velocity interpretation: <1.31 ft/sec, indicative of household ambulator General Gait Details: reciprocal stepping pattern, mildly antalgic; slow and guarded.  Walker consistent veering L with decreased visual attention to R.  Cuing for R UE placement/grasp on walker at times, as R UE tends to drift off  Stairs            Wheelchair Mobility    Modified Rankin (Stroke Patients Only)       Balance Overall balance assessment: Needs assistance Sitting-balance support: No upper extremity supported;Feet supported Sitting balance-Leahy Scale: Good     Standing balance support: Bilateral upper extremity supported Standing balance-Leahy Scale: Fair                               Pertinent Vitals/Pain Pain Assessment: No/denies pain    Home Living Family/patient expects to be discharged to:: Private residence Living Arrangements: Alone Available Help at Discharge: Family Type of Home: House Home Access: Stairs to enter Entrance Stairs-Rails: None Entrance Stairs-Number of Steps: 1 Home Layout: One level Home Equipment: Cane - single point Additional Comments: Planning to discharge home with daughter    Prior Function Level of Independence: Independent with assistive device(s)         Comments: Mod indep with SPC for ADLs, household and community mobilization with  SPC; utilizes public transportation for community access as needed. Denies fall history.     Hand Dominance   Dominant Hand: Right    Extremity/Trunk Assessment   Upper Extremity Assessment Upper Extremity Assessment:  (R UE grossly 3-/5, all planes, reported paresthesia R hand, moderate fine motor/coordination deficits evident, + drift; L UE grossly WFL)    Lower Extremity Assessment Lower Extremity Assessment: (R LE grossly 4-/5 throughout, denies sensory deficit; L LE grossly 4+/5)       Communication   Communication: (intermittent word-finding difficulties during session)  Cognition Arousal/Alertness: Awake/alert Behavior During Therapy: Flat affect Overall Cognitive Status: Within Functional Limits for tasks assessed                                 General Comments: oriented to basic information; follow commands      General Comments      Exercises Other Exercises Other Exercises: Toilet transfer, ambulatory with RW, min assist; sit/stand from standard toilet, min assist; standing balance at sink for hand hygiene, min assist.  Act assist/hand-over-hand for R UE functional use.   Assessment/Plan    PT Assessment Patient needs continued PT services  PT Problem List Decreased mobility;Decreased strength;Decreased range of motion;Decreased activity tolerance;Decreased balance;Decreased coordination;Decreased knowledge of use of DME;Decreased safety awareness;Cardiopulmonary status limiting activity;Decreased knowledge of precautions       PT Treatment Interventions DME instruction;Gait training;Stair training;Functional mobility training;Therapeutic activities;Therapeutic exercise;Balance training;Neuromuscular re-education;Cognitive remediation;Patient/family education    PT Goals (Current goals can be found in the Care Plan section)  Acute Rehab PT Goals Patient Stated Goal: to return home with daughter PT Goal Formulation: With patient/family Time For Goal Achievement: 04/23/19 Potential to Achieve Goals: Good    Frequency Min 2X/week   Barriers to discharge        Co-evaluation               AM-PAC PT "6 Clicks" Mobility  Outcome Measure Help needed  turning from your back to your side while in a flat bed without using bedrails?: None Help needed moving from lying on your back to sitting on the side of a flat bed without using bedrails?: A Little Help needed moving to and from a bed to a chair (including a wheelchair)?: A Little Help needed standing up from a chair using your arms (e.g., wheelchair or bedside chair)?: A Little Help needed to walk in hospital room?: A Little Help needed climbing 3-5 steps with a railing? : A Little 6 Click Score: 19    End of Session Equipment Utilized During Treatment: Gait belt Activity Tolerance: Patient tolerated treatment well Patient left: in bed;with call bell/phone within reach(with nursing for continued assessment) Nurse Communication: Mobility status PT Visit Diagnosis: Muscle weakness (generalized) (M62.81);Difficulty in walking, not elsewhere classified (R26.2)    Time: 1351-1436 PT Time Calculation (min) (ACUTE ONLY): 45 min   Charges:   PT Evaluation $PT Eval Moderate Complexity: 1 Mod PT Treatments $Therapeutic Activity: 8-22 mins $Neuromuscular Re-education: 8-22 mins        Gabor Lusk H. Owens Shark, PT, DPT, NCS 04/09/19, 2:51 PM [phone]

## 2019-04-09 NOTE — Progress Notes (Signed)
PROGRESS NOTE    [name]  OEV:[id number] DOB: 05-Apr-1932 DOA: 04/07/2019 PCP: Dierdre Harness, FNP   Brief Narrative:  HPI: [name] is a 86 y.o. female with medical history significant of hypertension, hyperlipidemia, diet-controlled diabetes, stroke, GERD, depression, anxiety, left eye blindness, alcohol abuse in remission, CKD stage III, dCHF, who presents with chest pain.  Patient states that she started having chest pain since yesterday.  She still has some chest pain earlier today, currently chest pain-free.  She states that the chest pain is located in substernal area, pressure-like, moderate, nonradiating.  Associated with mild shortness of breath.  She also has mild dry cough.  No fever or chills.  Patient denies nausea, vomiting, diarrhea, abdominal pain, symptoms of UTI or unilateral weakness.  No recent fall.  No dark stool.  Patient states that she quit drinking alcohol for more than 6 months.  Patient initially had sinus tachycardia with heart rate of 132, which improved to 71 after received 10 mg of Cardizem by IV in ED.  3/26: Patient underwent cardiac catheterization today.  Case discussed with Dr. Rockey Situ.  Catheterization had occluded RCA with collaterals left to right.  Multiple episodes of atrial tachycardia during catheterization.  Lopressor 2.5 mg IV given twice during cath.  Both normal sinus rhythm.  Per cardiology this is likely the underlying factor the patient's symptoms.  3/27: Patient seen and examined.  Status post cardiac catheterization.  Worsening kidney function over interval noted.  Creatinine 1.91 today.  Patient sitting up in bed.  In no visible distress.  Heart rate improved after initiation of beta-blocker per cardiology   Assessment & Plan:   Principal Problem:   Chest pain Active Problems:   Chronic diastolic heart failure (HCC)   Ischemic stroke (HCC)   Alcohol abuse   GERD without esophagitis   CKD (chronic kidney disease),  stage IV (HCC)   HTN (hypertension)   HLD (hyperlipidemia)   Elevated troponin   Unstable angina (HCC)  Chronic kidney disease stage IIIa, worsening Creatinine 1.14-->1.91 Possibly prerenal azotemia versus ATN secondary to dye exposure Will resume intravenous fluid resuscitation Monitor in house overnight Recheck creatinine in the morning Anticipate medical readiness for discharge on 04/10/2019 if creatinine improving   Unstable angina Status post cardiac catheterization Occluded RCA, collaterals left to right Recommending medical management Per cardiology worsening anginal symptoms likely secondary to atrial tachycardia Plan: Continue beta-blocker, Coreg 25 mg daily Aspirin 81 mg daily High intensity statin  Atrial tachycardia Likely driving her episodic chest pain and unstable angina Beta-blocker advance per cardiology   Hypertensive urgency Coreg 25 mg daily Imdur Hydralazine as needed  History of CVA Continue aspirin and Lipitor  History of alcohol abuse Patient endorses cessation  GERD On Protonix  Difficult IV access Multiple attempts by multiple different providers were unsuccessful Eventually midline was successful Midline in place in left upper extremity   DVT prophylaxis: Heparin Code Status: Full Family Communication: Granddaughter [name] [phone] on 04/09/2019 Disposition Plan: Anticipate return to previous home environment.  Post catheterization day #1.  No complaints of recurrent anginal pain.  Blood pressure heart rate remained reasonably well controlled.  Patient had worsening of her creatinine over interval.  Patient will need stabilization of downtrend creatinine prior to discharge.  If creatinine stabilized on 04/10/2019 anticipate discharge to previous home environment.  Physical therapy consult placed  Consultants:  Cardiology-[facility]  Procedures:   Left heart  catheterization-04/08/2019  Antimicrobials:  none   Subjective: Patient seen and examined  Vitals stabilized No complaints this morning   Objective: Vitals:   04/09/19 0436 04/09/19 0500 04/09/19 0747 04/09/19 1126  BP: (!) 136/48  (!) 152/72 (!) 107/46  Pulse: 67  78 65  Resp: 20 14 18 17   Temp: 98 F (36.7 C)  98.8 F (37.1 C) 98.2 F (36.8 C)  TempSrc: Oral  Oral Oral  SpO2: 97%  95% 97%  Weight:      Height:        Intake/Output Summary (Last 24 hours) at 04/09/2019 1252 Last data filed at 04/09/2019 0950 Gross per 24 hour  Intake 675 ml  Output 100 ml  Net 575 ml   Filed Weights   04/08/19 0918 04/08/19 1533 04/09/19 0143  Weight: 65.1 kg 68 kg 65 kg    Examination:  General exam: Appears calm and comfortable, left eye blindness Respiratory system: Clear to auscultation. Respiratory effort normal. Cardiovascular system: S1 & S2 heard, RRR. No JVD, murmurs, rubs, gallops or clicks. No pedal edema. Gastrointestinal system: Abdomen is nondistended, soft and nontender. No organomegaly or masses felt. Normal bowel sounds heard. Central nervous system: Alert and oriented. No focal neurological deficits. Extremities: Symmetric 5 x 5 power. Skin: No rashes, lesions or ulcers Psychiatry: Judgement and insight appear normal. Mood & affect appropriate.     Data Reviewed: I have personally reviewed following labs and imaging studies  CBC: Recent Labs  Lab 04/07/19 1116  WBC 7.6  HGB 12.1  HCT 37.2  MCV 91.4  PLT 564   Basic Metabolic Panel: Recent Labs  Lab 04/07/19 1116 04/08/19 0437 04/09/19 0457  NA 140 141 141  K 4.3 4.5 4.4  CL 107 108 109  CO2 25 20* 23  GLUCOSE 118* 83 71  BUN 18 22 29*  CREATININE 1.14* 1.64* 1.91*  CALCIUM 9.1 9.1 8.5*   GFR: Estimated Creatinine Clearance: 18.4 mL/min (A) (by C-G formula based on SCr of 1.91 mg/dL (H)). Liver Function Tests: Recent Labs  Lab 04/07/19 1116  AST 47*  ALT 27  ALKPHOS 75  BILITOT  0.7  PROT 7.6  ALBUMIN 3.6   No results for input(s): LIPASE, AMYLASE in the last 168 hours. No results for input(s): AMMONIA in the last 168 hours. Coagulation Profile: No results for input(s): INR, PROTIME in the last 168 hours. Cardiac Enzymes: No results for input(s): CKTOTAL, CKMB, CKMBINDEX, TROPONINI in the last 168 hours. BNP (last 3 results) Recent Labs    11/10/18 1516  PROBNP 924*   HbA1C: Recent Labs    04/08/19 0437  HGBA1C 6.2*   CBG: Recent Labs  Lab 04/08/19 0927 04/08/19 1036 04/08/19 1300  GLUCAP 71 89 80   Lipid Profile: Recent Labs    04/08/19 0437  CHOL 169  HDL 47  LDLCALC 91  TRIG 154*  CHOLHDL 3.6   Thyroid Function Tests: No results for input(s): TSH, T4TOTAL, FREET4, T3FREE, THYROIDAB in the last 72 hours. Anemia Panel: No results for input(s): VITAMINB12, FOLATE, FERRITIN, TIBC, IRON, RETICCTPCT in the last 72 hours. Sepsis Labs: No results for input(s): PROCALCITON, LATICACIDVEN in the last 168 hours.  Recent Results (from the past 240 hour(s))  Respiratory Panel by RT PCR (Flu A&B, Covid) - Nasopharyngeal Swab     Status: None   Collection Time: 04/07/19 12:54 PM   Specimen: Nasopharyngeal Swab  Result Value Ref Range Status   SARS Coronavirus 2 by RT PCR NEGATIVE NEGATIVE Final    Comment: (NOTE) SARS-CoV-2 target nucleic acids are NOT DETECTED. The  SARS-CoV-2 RNA is generally detectable in upper respiratoy specimens during the acute phase of infection. The lowest concentration of SARS-CoV-2 viral copies this assay can detect is 131 copies/mL. A negative result does not preclude SARS-Cov-2 infection and should not be used as the sole basis for treatment or other patient management decisions. A negative result may occur with  improper specimen collection/handling, submission of specimen other than nasopharyngeal swab, presence of viral mutation(s) within the areas targeted by this assay, and inadequate number of viral  copies (<131 copies/mL). A negative result must be combined with clinical observations, patient history, and epidemiological information. The expected result is Negative. Fact Sheet for Patients:  PinkCheek.be Fact Sheet for Healthcare Providers:  GravelBags.it This test is not yet ap proved or cleared by the [place] FDA and  has been authorized for detection and/or diagnosis of SARS-CoV-2 by FDA under an Emergency Use Authorization (EUA). This EUA will remain  in effect (meaning this test can be used) for the duration of the COVID-19 declaration under Section 564(b)(1) of the Act, 21 U.S.C. section 360bbb-3(b)(1), unless the authorization is terminated or revoked sooner.    Influenza A by PCR NEGATIVE NEGATIVE Final   Influenza B by PCR NEGATIVE NEGATIVE Final    Comment: (NOTE) The Xpert Xpress SARS-CoV-2/FLU/RSV assay is intended as an aid in  the diagnosis of influenza from Nasopharyngeal swab specimens and  should not be used as a sole basis for treatment. Nasal washings and  aspirates are unacceptable for Xpert Xpress SARS-CoV-2/FLU/RSV  testing. Fact Sheet for Patients: PinkCheek.be Fact Sheet for Healthcare Providers: GravelBags.it This test is not yet approved or cleared by the [place] FDA and  has been authorized for detection and/or diagnosis of SARS-CoV-2 by  FDA under an Emergency Use Authorization (EUA). This EUA will remain  in effect (meaning this test can be used) for the duration of the  Covid-19 declaration under Section 564(b)(1) of the Act, 21  U.S.C. section 360bbb-3(b)(1), unless the authorization is  terminated or revoked. Performed at [facility], [address], [place], [place] [address]          Radiology Studies: CARDIAC CATHETERIZATION  Result Date: 04/08/2019  Colon Flattery RCA to Mid RCA lesion is 100% stenosed.   Prox Cx lesion is 30% stenosed.  Mid LAD lesion is 30% stenosed.    [place] EKG SITE RITE  Result Date: 04/08/2019 If Site Rite image not attached, placement could not be confirmed due to current cardiac rhythm.       Scheduled Meds: . aspirin EC  81 mg Oral Daily  . atorvastatin  40 mg Oral q1800  . brimonidine  1 drop Right Eye BID  . carvedilol  25 mg Oral BID WC  . folic acid  1 mg Oral Daily  . gabapentin  300 mg Oral TID  . heparin  5,000 Units Subcutaneous Q8H  . isosorbide mononitrate  60 mg Oral BID  . latanoprost  1 drop Right Eye QHS  . multivitamin with minerals  1 tablet Oral Daily  . pantoprazole  40 mg Oral Daily  . sodium chloride flush  10-40 mL Intracatheter Q12H  . sodium chloride flush  3 mL Intravenous Q12H  . sodium chloride flush  3 mL Intravenous Q12H  . thiamine  100 mg Oral Daily   Or  . thiamine  100 mg Intravenous Daily   Continuous Infusions: . sodium chloride    . sodium chloride 75 mL/hr at 04/09/19 [id number]  LOS: 1 day    Time spent: 35 minutes    Sidney Ace, MD [facility] Pager [phone]  If 7PM-7AM, please contact night-coverage 04/09/2019, 12:52 PM

## 2019-04-10 ENCOUNTER — Inpatient Hospital Stay: Payer: Medicare Other

## 2019-04-10 DIAGNOSIS — F1010 Alcohol abuse, uncomplicated: Secondary | ICD-10-CM

## 2019-04-10 LAB — NA AND K (SODIUM & POTASSIUM), RAND UR
Potassium Urine: 24 mmol/L
Sodium, Ur: 16 mmol/L

## 2019-04-10 LAB — URINALYSIS, COMPLETE (UACMP) WITH MICROSCOPIC
Bilirubin Urine: NEGATIVE
Glucose, UA: NEGATIVE mg/dL
Hgb urine dipstick: NEGATIVE
Ketones, ur: NEGATIVE mg/dL
Nitrite: POSITIVE — AB
Protein, ur: 30 mg/dL — AB
Specific Gravity, Urine: 1.013 (ref 1.005–1.030)
WBC, UA: >50 WBC/hpf — ABNORMAL HIGH (ref 0–5)
pH: 5 (ref 5.0–8.0)

## 2019-04-10 LAB — CBC
HCT: 30 % — ABNORMAL LOW (ref 36.0–46.0)
Hemoglobin: 10.1 g/dL — ABNORMAL LOW (ref 12.0–15.0)
MCH: 31.1 pg (ref 26.0–34.0)
MCHC: 33.7 g/dL (ref 30.0–36.0)
MCV: 92.3 fL (ref 80.0–100.0)
Platelets: 176 10*3/uL (ref 150–400)
RBC: 3.25 MIL/uL — ABNORMAL LOW (ref 3.87–5.11)
RDW: 14.1 % (ref 11.5–15.5)
WBC: 6.9 10*3/uL (ref 4.0–10.5)
nRBC: 1 % — ABNORMAL HIGH (ref 0.0–0.2)

## 2019-04-10 LAB — BASIC METABOLIC PANEL
Anion gap: 9 (ref 5–15)
BUN: 34 mg/dL — ABNORMAL HIGH (ref 8–23)
CO2: 22 mmol/L (ref 22–32)
Calcium: 8.7 mg/dL — ABNORMAL LOW (ref 8.9–10.3)
Chloride: 106 mmol/L (ref 98–111)
Creatinine, Ser: 2.44 mg/dL — ABNORMAL HIGH (ref 0.44–1.00)
GFR calc Af Amer: 20 mL/min — ABNORMAL LOW (ref >60)
GFR calc non Af Amer: 18 mL/min — ABNORMAL LOW (ref >60)
Glucose, Bld: 83 mg/dL (ref 70–99)
Potassium: 4.3 mmol/L (ref 3.5–5.1)
Sodium: 137 mmol/L (ref 135–145)

## 2019-04-10 LAB — CREATININE, URINE, RANDOM: Creatinine, Urine: 113 mg/dL

## 2019-04-10 MED ORDER — SODIUM CHLORIDE 0.9 % IV SOLN: INTRAVENOUS | Status: AC

## 2019-04-10 NOTE — Consult Note (Signed)
Malani Lees MRN: [id number] DOB/AGE: 17-Dec-1932 86 y.o. Primary Care Physician:Warden, Su Grand, FNP Admit date: 04/07/2019 Chief Complaint:  Chief Complaint  Patient presents with  . Chest Pain   HPI: Patient is a 86 year old African-American female with a past medical history of hypertension, hyperlipidemia, diet-controlled diabetes, stroke, GERD, depression, anxiety, left eye blindness, alcohol abuse in remission, CKD stage III,dCHF, who presented  with chest pain on April 07, 2019.  At the time of presentation patient stated that she had  chest pain since yesterday.  Patient was found to have elevated tropes.  Patient was admitted with possibly unstable angina versus non-ST elevation MI. Patient later underwent cardiac catheterization on April 08, 2019 . During cardiac Catheterization patient had occluded RCA with collaterals left to right.    While patient was undergoing cath patient had multiple episodes of atrial tachycardia.  Patient received IV beta-blockers and later stabilized.Patient was later seen for SVT and started on amiodarone. Patient did have episodes of hypotension with systolic blood pressure being less than 110 on April 09, 2019 Patient had a rise in creatinine and nephrology was consulted  Patient offers no specific complaint. Patient says her chest pain is better No complaint of fever, cough or chills No complaint of nausea vomiting diarrhea No complaint of hematuria No complaint of increased frequency/urgency/dysuria. No complaint of change in speech or vision   Past Medical History:  Diagnosis Date  . Anemia 04/03/2011  . Anginal pain ([place])    a. 02/2014 MV: Low risk; b. 05/2017 MV: small area of inferior basal ischemia. EF 54% - low risk-->Med mgmt.  Marland Kitchen Anxiety   . Aortic valve sclerosis w/ systolic murmur    a. 21/3086 AoV sclerosis w/o stenosis.  . Arthritis    "feet" (06/02/2017)  . Blind left eye 1999  . CKD (chronic kidney disease) stage 3, GFR  30-59 ml/min   . Depression   . Diastolic dysfunction    a. 10/2018 Echo: EF 60-65%, mild septal/posterior LVH. Impaired relaxation. Nl RV size/fxn. Mod dil LA. Trace TR. Triv AI. Mod AoV sclerosis w/o stenosis. Nl PASP.  Marland Kitchen Exertional dyspnea   . GERD (gastroesophageal reflux disease)   . High cholesterol   . Hip fracture ([place]) 07/17/11   fall from 1-2 feet; left  . Hypertension   . Ischemic stroke ([place]) 05/2017   a. 06/2017 Holter: RSR, PACs, no afib.  . Prosthetic eye globe 1999   "from diabetic; left eye"  . Type II diabetes mellitus ([facility])    not taking any medication (06/02/2017)        Family History  Problem Relation Age of Onset  . Diabetes type II Mother   . Diabetes type II Father   . Diabetes type II Sister   . Colon cancer Neg Hx     Social History:  reports that she quit smoking about 21 years ago. Her smoking use included cigarettes. She has a 70.50 pack-year smoking history. She has never used smokeless tobacco. She reports previous alcohol use of about 17.0 standard drinks of alcohol per week. She reports that she does not use drugs.   Allergies: No Known Allergies  Medications Prior to Admission  Medication Sig Dispense Refill  . acetaminophen (TYLENOL) 325 MG tablet Take 2 tablets (650 mg total) by mouth every 6 (six) hours as needed for mild pain or fever (or temp > 37.5 C (99.5 F)).    . ALPHAGAN P 0.1 % SOLN Place 1 drop into the right eye 2 (  two) times daily.    Marland Kitchen aspirin 81 MG tablet Take 81 mg by mouth daily.    Marland Kitchen atorvastatin (LIPITOR) 40 MG tablet Take 1 tablet (40 mg total) by mouth daily at 6 PM. 90 tablet 3  . carvedilol (COREG) 12.5 MG tablet Take 12.5 mg by mouth 2 (two) times daily with a meal.    . folic acid (FOLVITE) 1 MG tablet Take 1 tablet (1 mg total) by mouth daily.    Marland Kitchen gabapentin (NEURONTIN) 300 MG capsule Take 300 mg by mouth 3 (three) times daily.     . hydrALAZINE (APRESOLINE) 10 MG tablet Take 2 tablets (20 mg total) by mouth 3  (three) times daily. (Patient taking differently: Take 10 mg by mouth 3 (three) times daily. ) 180 tablet 2  . isosorbide mononitrate (IMDUR) 60 MG 24 hr tablet Take 2 tablets by mouth in the a.m and take 1 tablet by mouth at bedtime (Patient taking differently: Take 60 mg by mouth 2 (two) times daily. ) 90 tablet 3  . Multiple Vitamin (MULTIVITAMIN WITH MINERALS) TABS tablet Take 1 tablet by mouth daily.    . nitroGLYCERIN (NITROSTAT) 0.4 MG SL tablet Place 1 tablet (0.4 mg total) under the tongue every 5 (five) minutes as needed for chest pain. 25 tablet 3  . omeprazole (PRILOSEC) 20 MG capsule Take 1 capsule (20 mg total) by mouth daily. 30 capsule 9  . TRAVATAN Z 0.004 % SOLN ophthalmic solution Place 1 drop into the right eye at bedtime.          JIR:CVELF from the symptoms mentioned above,there are no other symptoms referable to all systems reviewed.  Marland Kitchen aspirin EC  81 mg Oral Daily  . atorvastatin  40 mg Oral q1800  . brimonidine  1 drop Right Eye BID  . carvedilol  25 mg Oral BID WC  . folic acid  1 mg Oral Daily  . gabapentin  300 mg Oral TID  . heparin  5,000 Units Subcutaneous Q8H  . isosorbide mononitrate  60 mg Oral BID  . latanoprost  1 drop Right Eye QHS  . multivitamin with minerals  1 tablet Oral Daily  . pantoprazole  40 mg Oral Daily  . sodium chloride flush  10-40 mL Intracatheter Q12H  . sodium chloride flush  3 mL Intravenous Q12H  . sodium chloride flush  3 mL Intravenous Q12H  . thiamine  100 mg Oral Daily   Or  . thiamine  100 mg Intravenous Daily       Physical Exam: Vital signs in last 24 hours: Temp:  [97.9 F (36.6 C)-99.1 F (37.3 C)] 99.1 F (37.3 C) (03/28 0723) Pulse Rate:  [63-72] 67 (03/28 0723) Resp:  [13-36] 16 (03/28 0723) BP: (113-152)/(50-72) 152/65 (03/28 0723) SpO2:  [97 %-100 %] 97 % (03/28 0723) Weight:  [66.4 kg] 66.4 kg (03/28 0418) Weight change: 1.261 kg Last BM Date: 04/07/19  Intake/Output from previous day: 03/27 0701  - 03/28 0700 In: 8101 [P.O.:720; I.V.:642] Out: 250 [Urine:250] Total I/O In: 250 [P.O.:240; I.V.:10] Out: 0    Physical Exam: General- pt is awake,alert, oriented to time place and person Resp- No acute REsp distress, CTA B/L NO Rhonchi CVS- S1S2 regular ij rate and rhythm GIT- BS+, soft, NT, ND EXT- NO LE Edema, Cyanosis CNS- CN 2-12 grossly intact. Moving all 4 extremities Psych- normal mood and affect    Lab Results:  Patient was hemoglobin was 12.1 on April 07, 2019  BMET Recent Labs    04/09/19 0457 04/10/19 0810  NA 141 137  K 4.4 4.3  CL 109 106  CO2 23 22  GLUCOSE 71 83  BUN 29* 34*  CREATININE 1.91* 2.44*  CALCIUM 8.5* 8.7*    Creatinine trend                         3.26   3.27    3.28 2021  1.14==> 1.6=>1.9==>2.4 In January 2.1--2.4  Patient underwent cardiac cath on April 08 2018  1.4--3.0 2019 0.9--1.5 2018 1.2 2017 1.2--1.3 2016 1.0--1.4  MICRO Recent Results (from the past 240 hour(s))  Respiratory Panel by RT PCR (Flu A&B, Covid) - Nasopharyngeal Swab     Status: None   Collection Time: 04/07/19 12:54 PM   Specimen: Nasopharyngeal Swab  Result Value Ref Range Status   SARS Coronavirus 2 by RT PCR NEGATIVE NEGATIVE Final    Comment: (NOTE) SARS-CoV-2 target nucleic acids are NOT DETECTED. The SARS-CoV-2 RNA is generally detectable in upper respiratoy specimens during the acute phase of infection. The lowest concentration of SARS-CoV-2 viral copies this assay can detect is 131 copies/mL. A negative result does not preclude SARS-Cov-2 infection and should not be used as the sole basis for treatment or other patient management decisions. A negative result may occur with  improper specimen collection/handling, submission of specimen other than nasopharyngeal swab, presence of viral mutation(s) within the areas targeted by this assay, and inadequate number of viral copies (<131 copies/mL). A negative result must be combined with  clinical observations, patient history, and epidemiological information. The expected result is Negative. Fact Sheet for Patients:  PinkCheek.be Fact Sheet for Healthcare Providers:  GravelBags.it This test is not yet ap proved or cleared by the [place] FDA and  has been authorized for detection and/or diagnosis of SARS-CoV-2 by FDA under an Emergency Use Authorization (EUA). This EUA will remain  in effect (meaning this test can be used) for the duration of the COVID-19 declaration under Section 564(b)(1) of the Act, 21 U.S.C. section 360bbb-3(b)(1), unless the authorization is terminated or revoked sooner.    Influenza A by PCR NEGATIVE NEGATIVE Final   Influenza B by PCR NEGATIVE NEGATIVE Final    Comment: (NOTE) The Xpert Xpress SARS-CoV-2/FLU/RSV assay is intended as an aid in  the diagnosis of influenza from Nasopharyngeal swab specimens and  should not be used as a sole basis for treatment. Nasal washings and  aspirates are unacceptable for Xpert Xpress SARS-CoV-2/FLU/RSV  testing. Fact Sheet for Patients: PinkCheek.be Fact Sheet for Healthcare Providers: GravelBags.it This test is not yet approved or cleared by the [place] FDA and  has been authorized for detection and/or diagnosis of SARS-CoV-2 by  FDA under an Emergency Use Authorization (EUA). This EUA will remain  in effect (meaning this test can be used) for the duration of the  Covid-19 declaration under Section 564(b)(1) of the Act, 21  U.S.C. section 360bbb-3(b)(1), unless the authorization is  terminated or revoked. Performed at [facility], [address] [place], [place] [address]       Lab Results  Component Value Date   CALCIUM 8.7 (L) 04/10/2019   CAION 1.12 (L) 02/21/2018   PHOS 3.1 05/30/2008     Right Kidney:  Renal measurements: 7.9 x 4.4 x 4.7 cm =  volume: 87 mL . Echogenicity within normal limits. No mass or hydronephrosis visualized.  Left Kidney:  Renal measurements: 9.8 x  4.3 x 5.1 cm = volume: 113 mL. Echogenicity within normal limits. No mass or hydronephrosis visualized.  Bladder:  Poorly visualized due to overlying bowel gas.  Other:  None.  IMPRESSION: No hydronephrosis.   Impression:  Patient is a 86 year old African-American female with a past medical history of hypertension, hyperlipidemia, diabetes mellitus, CVA, GERD, depression, left eye blindness, alcohol abuse in remission, CKD stage IIIb, diastolic CHF who was admitted with chest pain.  Patient underwent cardiac cath and now has AKI  1)Renal  AKI secondary to ATN AKI secondary to IV contrast versus hypotension /hemodynamic instability versus cholesterol  emboli Patient had acute kidney injury on chronic kidney disease Patient has CKD stage IIIb Patient has CKD since 2016 Patient CKD is most likely secondary to long-term history of hypertension/there could be some contribution from it so she declined as patient is 85 years old.  Data against cholesterol embolization is a factor of AKI is  #1No skin manifestations #2 Short duration between the event and the AKI   2)HTN  Patient blood pressure stable Patient is on alpha plus beta-blockers Patient is also on vasodilators  3)Anemia will recheck CBC   4) secondary hyperparathyroidism CKD Mineral-Bone Disorder Will check on patient's calcium and phosphorus levels  5) unstable angina Patient is s/p cardiac cath Patient is on medical management   6) electrolytes Patient sodium and pressure at goal   7)Acid base Co2 at goal  8) atrial tachycardia Patient is being closely followed by cardiology and primary team   Plan:   We will ask for FENA We will ask for complements We will ask for CBC Agree with the current treatment plan I agree with the IV fluids We will follow  patient's Chem-7 No acute need for renal replacement therapy at this time   Glenette Bookwalter s Donne Robillard 04/10/2019, 12:41 PM

## 2019-04-10 NOTE — Progress Notes (Signed)
PROGRESS NOTE    [name]  VZD:[id number] DOB: 1932/11/17 DOA: 04/07/2019 PCP: Dierdre Harness, FNP   Brief Narrative:  HPI: [name] is a 86 y.o. female with medical history significant of hypertension, hyperlipidemia, diet-controlled diabetes, stroke, GERD, depression, anxiety, left eye blindness, alcohol abuse in remission, CKD stage III, dCHF, who presents with chest pain.  Patient states that she started having chest pain since yesterday.  She still has some chest pain earlier today, currently chest pain-free.  She states that the chest pain is located in substernal area, pressure-like, moderate, nonradiating.  Associated with mild shortness of breath.  She also has mild dry cough.  No fever or chills.  Patient denies nausea, vomiting, diarrhea, abdominal pain, symptoms of UTI or unilateral weakness.  No recent fall.  No dark stool.  Patient states that she quit drinking alcohol for more than 6 months.  Patient initially had sinus tachycardia with heart rate of 132, which improved to 71 after received 10 mg of Cardizem by IV in ED.  3/26: Patient underwent cardiac catheterization today.  Case discussed with Dr. Rockey Situ.  Catheterization had occluded RCA with collaterals left to right.  Multiple episodes of atrial tachycardia during catheterization.  Lopressor 2.5 mg IV given twice during cath.  Both normal sinus rhythm.  Per cardiology this is likely the underlying factor the patient's symptoms.  3/27: Patient seen and examined.  Status post cardiac catheterization.  Worsening kidney function over interval noted.  Creatinine 1.91 today.  Patient sitting up in bed.  In no visible distress.  Heart rate improved after initiation of beta-blocker per cardiology  3/28: Patient seen and examined.  Worsening kidney function over interval.  Creatinine 2.44 today.  Patient in no visible distress.  Heart rate improved over interval.  Nephrology consult requested.  Recommendations greatly  appreciated.   Assessment & Plan:   Principal Problem:   Chest pain Active Problems:   Chronic diastolic heart failure (HCC)   Ischemic stroke (HCC)   Alcohol abuse   GERD without esophagitis   CKD (chronic kidney disease), stage IV (HCC)   HTN (hypertension)   HLD (hyperlipidemia)   Elevated troponin   Unstable angina (HCC)  Chronic kidney disease stage IIIa, worsening Creatinine 1.14-->1.91-->2.44 Possibly prerenal azotemia versus ATN secondary to dye exposure Plan: Intravenous fluid resuscitation Daily creatinine Check UA Urine electrolytes Renal ultrasound Nephrology consult, recommendations greatly appreciated  Unstable angina Status post cardiac catheterization Occluded RCA, collaterals left to right Recommending medical management Per cardiology worsening anginal symptoms likely secondary to atrial tachycardia Plan: Continue beta-blocker, Coreg 25 mg daily Aspirin 81 mg daily High intensity statin  Atrial tachycardia Likely driving her episodic chest pain and unstable angina Beta-blocker advance per cardiology   Hypertensive urgency Coreg 25 mg daily Imdur Hydralazine as needed  History of CVA Continue aspirin and Lipitor  History of alcohol abuse Patient endorses cessation  GERD On Protonix  Difficult IV access Multiple attempts by multiple different providers were unsuccessful Eventually midline was successful Midline in place in left upper extremity   DVT prophylaxis: Heparin Code Status: Full Family Communication: Granddaughter Salvatore Decent [phone] on 04/09/2019 Disposition Plan: Anticipate return to previous home environment.  Post catheterization day #2.  No complaints of recurrent anginal pain.  Blood pressure heart rate remained reasonably well controlled.  Patient's creatinine is still worsening.  Creatinine 2.44 today.  Nephrology consult requested.  Patient will need stabilization of kidney function prior to formulation of  disposition plan.  Consultants:  Cardiology-[facility]  Nephrology-[facility]  Procedures:   Left heart catheterization-04/08/2019  Antimicrobials:  none   Subjective: Patient seen and examined Vitals stabilized No complaints this morning   Objective: Vitals:   04/10/19 0400 04/10/19 0418 04/10/19 0500 04/10/19 0723  BP:  132/60  (!) 152/65  Pulse:  72  67  Resp: (!) 27 18 14 16   Temp:  98.6 F (37 C)  99.1 F (37.3 C)  TempSrc:  Oral  Oral  SpO2:  100%  97%  Weight:  66.4 kg    Height:        Intake/Output Summary (Last 24 hours) at 04/10/2019 1359 Last data filed at 04/10/2019 1107 Gross per 24 hour  Intake 892.04 ml  Output 250 ml  Net 642.04 ml   Filed Weights   04/08/19 1533 04/09/19 0143 04/10/19 0418  Weight: 68 kg 65 kg 66.4 kg    Examination:  General exam: Appears calm and comfortable, left eye blindness Respiratory system: Clear to auscultation. Respiratory effort normal. Cardiovascular system: S1 & S2 heard, RRR. No JVD, murmurs, rubs, gallops or clicks. No pedal edema. Gastrointestinal system: Abdomen is nondistended, soft and nontender. No organomegaly or masses felt. Normal bowel sounds heard. Central nervous system: Alert and oriented. No focal neurological deficits. Extremities: Symmetric 5 x 5 power. Skin: No rashes, lesions or ulcers Psychiatry: Judgement and insight appear normal. Mood & affect appropriate.     Data Reviewed: I have personally reviewed following labs and imaging studies  CBC: Recent Labs  Lab 04/07/19 1116  WBC 7.6  HGB 12.1  HCT 37.2  MCV 91.4  PLT 161   Basic Metabolic Panel: Recent Labs  Lab 04/07/19 1116 04/08/19 0437 04/09/19 0457 04/10/19 0810  NA 140 141 141 137  K 4.3 4.5 4.4 4.3  CL 107 108 109 106  CO2 25 20* 23 22  GLUCOSE 118* 83 71 83  BUN 18 22 29* 34*  CREATININE 1.14* 1.64* 1.91* 2.44*  CALCIUM 9.1 9.1 8.5* 8.7*   GFR: Estimated Creatinine Clearance: 14.6 mL/min (A) (by  C-G formula based on SCr of 2.44 mg/dL (H)). Liver Function Tests: Recent Labs  Lab 04/07/19 1116  AST 47*  ALT 27  ALKPHOS 75  BILITOT 0.7  PROT 7.6  ALBUMIN 3.6   No results for input(s): LIPASE, AMYLASE in the last 168 hours. No results for input(s): AMMONIA in the last 168 hours. Coagulation Profile: No results for input(s): INR, PROTIME in the last 168 hours. Cardiac Enzymes: No results for input(s): CKTOTAL, CKMB, CKMBINDEX, TROPONINI in the last 168 hours. BNP (last 3 results) Recent Labs    11/10/18 1516  PROBNP 924*   HbA1C: Recent Labs    04/08/19 0437  HGBA1C 6.2*   CBG: Recent Labs  Lab 04/08/19 0927 04/08/19 1036 04/08/19 1300 04/09/19 1429  GLUCAP 71 89 80 108*   Lipid Profile: Recent Labs    04/08/19 0437  CHOL 169  HDL 47  LDLCALC 91  TRIG 154*  CHOLHDL 3.6   Thyroid Function Tests: No results for input(s): TSH, T4TOTAL, FREET4, T3FREE, THYROIDAB in the last 72 hours. Anemia Panel: No results for input(s): VITAMINB12, FOLATE, FERRITIN, TIBC, IRON, RETICCTPCT in the last 72 hours. Sepsis Labs: No results for input(s): PROCALCITON, LATICACIDVEN in the last 168 hours.  Recent Results (from the past 240 hour(s))  Respiratory Panel by RT PCR (Flu A&B, Covid) - Nasopharyngeal Swab     Status: None   Collection Time: 04/07/19 12:54 PM   Specimen: Nasopharyngeal  Swab  Result Value Ref Range Status   SARS Coronavirus 2 by RT PCR NEGATIVE NEGATIVE Final    Comment: (NOTE) SARS-CoV-2 target nucleic acids are NOT DETECTED. The SARS-CoV-2 RNA is generally detectable in upper respiratoy specimens during the acute phase of infection. The lowest concentration of SARS-CoV-2 viral copies this assay can detect is 131 copies/mL. A negative result does not preclude SARS-Cov-2 infection and should not be used as the sole basis for treatment or other patient management decisions. A negative result may occur with  improper specimen collection/handling,  submission of specimen other than nasopharyngeal swab, presence of viral mutation(s) within the areas targeted by this assay, and inadequate number of viral copies (<131 copies/mL). A negative result must be combined with clinical observations, patient history, and epidemiological information. The expected result is Negative. Fact Sheet for Patients:  PinkCheek.be Fact Sheet for Healthcare Providers:  GravelBags.it This test is not yet ap proved or cleared by the [place] FDA and  has been authorized for detection and/or diagnosis of SARS-CoV-2 by FDA under an Emergency Use Authorization (EUA). This EUA will remain  in effect (meaning this test can be used) for the duration of the COVID-19 declaration under Section 564(b)(1) of the Act, 21 U.S.C. section 360bbb-3(b)(1), unless the authorization is terminated or revoked sooner.    Influenza A by PCR NEGATIVE NEGATIVE Final   Influenza B by PCR NEGATIVE NEGATIVE Final    Comment: (NOTE) The Xpert Xpress SARS-CoV-2/FLU/RSV assay is intended as an aid in  the diagnosis of influenza from Nasopharyngeal swab specimens and  should not be used as a sole basis for treatment. Nasal washings and  aspirates are unacceptable for Xpert Xpress SARS-CoV-2/FLU/RSV  testing. Fact Sheet for Patients: PinkCheek.be Fact Sheet for Healthcare Providers: GravelBags.it This test is not yet approved or cleared by the [place] FDA and  has been authorized for detection and/or diagnosis of SARS-CoV-2 by  FDA under an Emergency Use Authorization (EUA). This EUA will remain  in effect (meaning this test can be used) for the duration of the  Covid-19 declaration under Section 564(b)(1) of the Act, 21  U.S.C. section 360bbb-3(b)(1), unless the authorization is  terminated or revoked. Performed at [facility], [address], [place], [place] [address]          Radiology Studies: US RENAL  Result Date: 04/10/2019 CLINICAL DATA:  Acute renal failure. EXAM: RENAL / URINARY TRACT ULTRASOUND COMPLETE COMPARISON:  None. FINDINGS: Right Kidney: Renal measurements: 7.9 x 4.4 x 4.7 cm = volume: 87 mL . Echogenicity within normal limits. No mass or hydronephrosis visualized. Left Kidney: Renal measurements: 9.8 x 4.3 x 5.1 cm = volume: 113 mL. Echogenicity within normal limits. No mass or hydronephrosis visualized. Bladder: Poorly visualized due to overlying bowel gas. Other: None. IMPRESSION: No hydronephrosis. Electronically Signed   By: Lovey Newcomer M.D.   On: 04/10/2019 12:05   CT HEAD CODE STROKE WO CONTRAST`  Result Date: 04/09/2019 CLINICAL DATA:  Code stroke.  Acute neuro deficit.  Rule out stroke. EXAM: CT HEAD WITHOUT CONTRAST TECHNIQUE: Contiguous axial images were obtained from the base of the skull through the vertex without intravenous contrast. COMPARISON:  CT head 01/20/2019 FINDINGS: Brain: Generalized atrophy without hydrocephalus. Moderate chronic microvascular ischemic changes involving the white matter and internal capsule. Chronic infarct left pons unchanged. Chronic infarct right cerebellum unchanged. Negative for acute infarct, hemorrhage, mass Vascular: Normal arterial flow voids. Atherosclerotic calcification in the cavernous carotid bilaterally. Skull: Normal calvarium.  Extensive ossification of the falx. Sinuses/Orbits: Left eye prosthesis.  Visualized sinuses clear Other: None ASPECTS ([place] Stroke Program Early CT Score) - Ganglionic level infarction (caudate, lentiform nuclei, internal capsule, insula, M1-M3 cortex): 7 - Supraganglionic infarction (M4-M6 cortex): 3 Total score (0-10 with 10 being normal): 10 IMPRESSION: 1. Atrophy and chronic microvascular ischemic change. No acute abnormality 2. ASPECTS is 10 3. These results were called by telephone at the time of interpretation on 04/09/2019 at  3:00 pm to provider [facility] , who verbally acknowledged these results. Electronically Signed   By: Franchot Gallo M.D.   On: 04/09/2019 15:00   [place] EKG SITE RITE  Result Date: 04/08/2019 If Site Rite image not attached, placement could not be confirmed due to current cardiac rhythm.       Scheduled Meds: . aspirin EC  81 mg Oral Daily  . atorvastatin  40 mg Oral q1800  . brimonidine  1 drop Right Eye BID  . carvedilol  25 mg Oral BID WC  . folic acid  1 mg Oral Daily  . gabapentin  300 mg Oral TID  . heparin  5,000 Units Subcutaneous Q8H  . isosorbide mononitrate  60 mg Oral BID  . latanoprost  1 drop Right Eye QHS  . multivitamin with minerals  1 tablet Oral Daily  . pantoprazole  40 mg Oral Daily  . sodium chloride flush  10-40 mL Intracatheter Q12H  . sodium chloride flush  3 mL Intravenous Q12H  . sodium chloride flush  3 mL Intravenous Q12H  . thiamine  100 mg Oral Daily   Or  . thiamine  100 mg Intravenous Daily   Continuous Infusions: . sodium chloride    . sodium chloride 100 mL/hr at 04/10/19 1052     LOS: 2 days    Time spent: 35 minutes    Sidney Ace, MD [facility] Pager [phone]  If 7PM-7AM, please contact night-coverage 04/10/2019, 1:59 PM

## 2019-04-10 NOTE — Progress Notes (Signed)
Progress Note   Subjective   Doing well today, the patient denies CP or SOB.  "I feel a little better today". No new concerns  Inpatient Medications    Scheduled Meds: . aspirin EC  81 mg Oral Daily  . atorvastatin  40 mg Oral q1800  . brimonidine  1 drop Right Eye BID  . carvedilol  25 mg Oral BID WC  . folic acid  1 mg Oral Daily  . gabapentin  300 mg Oral TID  . heparin  5,000 Units Subcutaneous Q8H  . isosorbide mononitrate  60 mg Oral BID  . latanoprost  1 drop Right Eye QHS  . multivitamin with minerals  1 tablet Oral Daily  . pantoprazole  40 mg Oral Daily  . sodium chloride flush  10-40 mL Intracatheter Q12H  . sodium chloride flush  3 mL Intravenous Q12H  . sodium chloride flush  3 mL Intravenous Q12H  . thiamine  100 mg Oral Daily   Or  . thiamine  100 mg Intravenous Daily   Continuous Infusions: . sodium chloride    . sodium chloride 100 mL/hr at 04/10/19 1052   PRN Meds: sodium chloride, acetaminophen, acetaminophen, hydrALAZINE, morphine injection, nitroGLYCERIN, ondansetron (ZOFRAN) IV, ondansetron (ZOFRAN) IV, sodium chloride flush, sodium chloride flush   Vital Signs    Vitals:   04/10/19 0400 04/10/19 0418 04/10/19 0500 04/10/19 0723  BP:  132/60  (!) 152/65  Pulse:  72  67  Resp: (!) 27 18 14 16   Temp:  98.6 F (37 C)  99.1 F (37.3 C)  TempSrc:  Oral  Oral  SpO2:  100%  97%  Weight:  66.4 kg    Height:        Intake/Output Summary (Last 24 hours) at 04/10/2019 1512 Last data filed at 04/10/2019 1330 Gross per 24 hour  Intake 1132.04 ml  Output 250 ml  Net 882.04 ml   Filed Weights   04/08/19 1533 04/09/19 0143 04/10/19 0418  Weight: 68 kg 65 kg 66.4 kg    Telemetry    Sinus, no further SVT - Personally Reviewed  Physical Exam   GEN- The patient is chronically ill appearing, alert and oriented x 3 today.   Head- normocephalic, atraumatic Ears- hearing intact Oropharynx- clear Neck- supple, Lungs-  normal work of  breathing Heart- Regular rate and rhythm  GI- soft  Extremities- no clubbing, cyanosis, or edema  MS- no significant deformity or atrophy Skin- no rash or lesion Psych- euthymic mood, full affect Neuro- strength and sensation are intact   Labs    Chemistry Recent Labs  Lab 04/07/19 1116 04/07/19 1116 04/08/19 0437 04/09/19 0457 04/10/19 0810  NA 140   < > 141 141 137  K 4.3   < > 4.5 4.4 4.3  CL 107   < > 108 109 106  CO2 25   < > 20* 23 22  GLUCOSE 118*   < > 83 71 83  BUN 18   < > 22 29* 34*  CREATININE 1.14*   < > 1.64* 1.91* 2.44*  CALCIUM 9.1   < > 9.1 8.5* 8.7*  PROT 7.6  --   --   --   --   ALBUMIN 3.6  --   --   --   --   AST 47*  --   --   --   --   ALT 27  --   --   --   --  ALKPHOS 75  --   --   --   --   BILITOT 0.7  --   --   --   --   GFRNONAA 44*   < > 28* 24* 18*  GFRAA 51*   < > 33* 27* 20*  ANIONGAP 8   < > 13 9 9    < > = values in this interval not displayed.     Hematology Recent Labs  Lab 04/07/19 1116  WBC 7.6  RBC 4.07  HGB 12.1  HCT 37.2  MCV 91.4  MCH 29.7  MCHC 32.5  RDW 14.5  PLT 229     Patient Profile     86 year old woman followed by cardiology in [place], history of chronic chest pain, hypertension, hyperlipidemia, stage III kidney disease, diabetes, presenting with chest pain, hypertensive urgency, elevated troponin    Assessment & Plan    1.  SVT Likely atrial tachycardia Improved with coreg Could consider amiodarone if episodes worsen  2. Angina/ CAD Occluded RCA (chronic) Improved with medical therapy  3. Worsening renal failure Concerning for ATN post cath I agree with nephrology consultation  4. Hypertensive urgency Improved with increased coreg  Thompson Grayer MD, [facility] 04/10/2019 3:12 PM

## 2019-04-10 NOTE — Evaluation (Signed)
Occupational Therapy Evaluation Patient Details Name: [name] MRN: [id number] DOB: [birth year] Today's Date: 04/10/2019    History of Present Illness presented to ER secondary to substernal chest pain, SOB; admitted for management of chest pain with elevated troponin.  s/p cardiac cath 3/26 significant for occluded RCA with collaterals L to R, recommended for medical management.   Clinical Impression   [name] was seen for OT evaluation this date. Pt lives alone and prior to hospital admission, pt was MOD I c SPC for functional mobility, IND with I/ADLs, and utilized public transportation for community access. Pt presents to acute OT demonstrating impaired ADL performance and functional mobility 2/2 fine motor deficits in R dominant hand, functional endurance/strength/balance deficits, and decreased safety awareness. Pt currently requires CGA + RW for toileting and hygiene at [facility]. Pt would benefit from skilled OT to address noted impairments and functional limitations (see below for any additional details) in order to maximize safety and independence while minimizing falls risk and caregiver burden. Upon hospital discharge, recommend HHOT to maximize pt safety and return to functional independence during meaningful occupations of daily life.     Follow Up Recommendations  Home health OT;Supervision/Assistance - 24 hour    Equipment Recommendations  Other (comment)(grab bars)    Recommendations for Other Services       Precautions / Restrictions Precautions Precautions: Fall Restrictions Weight Bearing Restrictions: No      Mobility Bed Mobility Overal bed mobility: Needs Assistance Bed Mobility: Supine to Sit     Supine to sit: HOB elevated        Transfers Overall transfer level: Needs assistance Equipment used: Rolling walker (2 wheeled) Transfers: Sit to/from Stand Sit to Stand: Min guard         General transfer comment: VCs to keep R hand on RW  during mobility    Balance Overall balance assessment: Needs assistance Sitting-balance support: No upper extremity supported;Feet unsupported Sitting balance-Leahy Scale: Good     Standing balance support: Bilateral upper extremity supported Standing balance-Leahy Scale: Fair                             ADL either performed or assessed with clinical judgement   ADL Overall ADL's : Needs assistance/impaired                                       General ADL Comments: SBA don B socks sitting EOB c feet unsupported. SUP toileting at [facility] and CGA + RW for perihygiene in standing. Pt required increased time to wipe posteriorly using R dominant hand 2/2 decreased fine motor in dominant hand. SETUP + MOD VCs self-drinking seated in bed - VCs to integrate R dominant hand.     Vision Baseline Vision/History: Wears glasses Wears Glasses: At all times(L eye is glass)       Perception     Praxis      Pertinent Vitals/Pain Pain Assessment: No/denies pain     Hand Dominance Right   Extremity/Trunk Assessment Upper Extremity Assessment Upper Extremity Assessment: RUE deficits/detail RUE Deficits / Details: RUE 3/5 grossly. 5 finger opposition intact c increased time. Requires LUE assist to pick up cup using cylindrical grasp in R dominant hand. + drift RUE Coordination: decreased fine motor   Lower Extremity Assessment Lower Extremity Assessment: Overall WFL for tasks assessed  Communication Communication Communication: No difficulties   Cognition Arousal/Alertness: Awake/alert Behavior During Therapy: WFL for tasks assessed/performed Overall Cognitive Status: Within Functional Limits for tasks assessed                                     General Comments       Exercises Exercises: Other exercises Other Exercises Other Exercises: Pt educated re: falls prevention, home/routine modification, importance of supervision for OOB  moblility, HEP Other Exercises: Toileting at [facility] including perihygiene, sup>sit, sit<>stand x2, don B socks, self-drinking Other Exercises: THER EX RUE: 1 set x 10 each: isolated digit activation, fist pumps, sequential oppsition   Shoulder Instructions      Home Living Family/patient expects to be discharged to:: Private residence Living Arrangements: Alone Available Help at Discharge: Family Type of Home: House Home Access: Stairs to enter Technical brewer of Steps: 1 Entrance Stairs-Rails: None Home Layout: One level     Bathroom Shower/Tub: Teacher, early years/pre: [place] - single point;Shower seat;Hand held shower head   Additional Comments: Planning to discharge home with daughter      Prior Functioning/Environment Level of Independence: Independent with assistive device(s)        Comments: Mod indep with SPC for ADLs, household and community mobilization with [facility]; utilizes public transportation for community access as needed. Denies fall history.        OT Problem List: Decreased strength;Decreased range of motion;Impaired vision/perception;Decreased knowledge of use of DME or AE;Impaired UE functional use      OT Treatment/Interventions: Self-care/ADL training;Therapeutic exercise;Neuromuscular education;Energy conservation;DME and/or AE instruction;Therapeutic activities;Visual/perceptual remediation/compensation;Patient/family education    OT Goals(Current goals can be found in the care plan section) Acute Rehab OT Goals Patient Stated Goal: to return home with daughter OT Goal Formulation: With patient Time For Goal Achievement: 04/24/19 Potential to Achieve Goals: Good ADL Goals Pt Will Perform Eating: with modified independence;sitting(c no cues for bimanual integration) Pt Will Perform Lower Body Dressing: with modified independence;sit to/from stand(c LRAD PRN) Pt Will Transfer to Toilet: with modified  independence;regular height toilet;grab bars(c LRAD PRN) Pt/caregiver will Perform Home Exercise Program: Increased strength;Right Upper extremity  OT Frequency: Min 2X/week   Barriers to D/C: Decreased caregiver support;Inaccessible home environment          Co-evaluation              AM-PAC OT "6 Clicks" Daily Activity     Outcome Measure Help from another person eating meals?: A Lot Help from another person taking care of personal grooming?: A Little Help from another person toileting, which includes using toliet, bedpan, or urinal?: A Little Help from another person bathing (including washing, rinsing, drying)?: A Little Help from another person to put on and taking off regular upper body clothing?: A Little Help from another person to put on and taking off regular lower body clothing?: A Little 6 Click Score: 17   End of Session Equipment Utilized During Treatment: Rolling walker  Activity Tolerance: Patient tolerated treatment well Patient left: in chair;with call bell/phone within reach;with chair alarm set  OT Visit Diagnosis: Unsteadiness on feet (R26.81);Other abnormalities of gait and mobility (R26.89)                Time: 9622-2979 OT Time Calculation (min): 22 min Charges:  OT General Charges $OT Visit: 1 Visit OT Evaluation $OT Eval Moderate  Complexity: 1 Mod OT Treatments $Self Care/Home Management : 8-22 mins  Dessie Coma, M.S. OTR/L  04/10/19, 11:33 AM

## 2019-04-11 ENCOUNTER — Inpatient Hospital Stay: Payer: Medicare Other

## 2019-04-11 ENCOUNTER — Encounter: Payer: Self-pay | Admitting: Cardiology

## 2019-04-11 DIAGNOSIS — I2510 Atherosclerotic heart disease of native coronary artery without angina pectoris: Secondary | ICD-10-CM

## 2019-04-11 DIAGNOSIS — N179 Acute kidney failure, unspecified: Secondary | ICD-10-CM

## 2019-04-11 DIAGNOSIS — R000 Tachycardia, unspecified: Secondary | ICD-10-CM

## 2019-04-11 LAB — BASIC METABOLIC PANEL
Anion gap: 7 (ref 5–15)
BUN: 35 mg/dL — ABNORMAL HIGH (ref 8–23)
CO2: 23 mmol/L (ref 22–32)
Calcium: 8.5 mg/dL — ABNORMAL LOW (ref 8.9–10.3)
Chloride: 109 mmol/L (ref 98–111)
Creatinine, Ser: 2.29 mg/dL — ABNORMAL HIGH (ref 0.44–1.00)
GFR calc Af Amer: 22 mL/min — ABNORMAL LOW (ref >60)
GFR calc non Af Amer: 19 mL/min — ABNORMAL LOW (ref >60)
Glucose, Bld: 92 mg/dL (ref 70–99)
Potassium: 4.1 mmol/L (ref 3.5–5.1)
Sodium: 139 mmol/L (ref 135–145)

## 2019-04-11 LAB — CBC
HCT: 29.2 % — ABNORMAL LOW (ref 36.0–46.0)
Hemoglobin: 9.5 g/dL — ABNORMAL LOW (ref 12.0–15.0)
MCH: 30.3 pg (ref 26.0–34.0)
MCHC: 32.5 g/dL (ref 30.0–36.0)
MCV: 93 fL (ref 80.0–100.0)
Platelets: 195 10*3/uL (ref 150–400)
RBC: 3.14 MIL/uL — ABNORMAL LOW (ref 3.87–5.11)
RDW: 14.2 % (ref 11.5–15.5)
WBC: 5.3 10*3/uL (ref 4.0–10.5)
nRBC: 0 % (ref 0.0–0.2)

## 2019-04-11 LAB — PHOSPHORUS: Phosphorus: 4.1 mg/dL (ref 2.5–4.6)

## 2019-04-11 MED ORDER — IOHEXOL 300 MG/ML  SOLN
INTRAMUSCULAR | Status: DC | PRN
  Administered 2019-04-08: 120 mL via INTRA_ARTERIAL

## 2019-04-11 MED ORDER — ACETAMINOPHEN 325 MG PO TABS: 650.0000 mg | ORAL_TABLET | ORAL | Status: DC | PRN

## 2019-04-11 NOTE — Progress Notes (Signed)
Occupational Therapy Treatment Patient Details Name: [name] MRN: [id number] DOB: [birth year] Today's Date: 04/11/2019    History of present illness presented to ER secondary to substernal chest pain, SOB; admitted for management of chest pain with elevated troponin.  s/p cardiac cath 3/26 significant for occluded RCA with collaterals L to R, recommended for medical management.   OT comments  Pt seen for OT tx this date. Pt alert, oriented, and willing to participate. Pt eager to get washed up. Pt performed bed mobility with supervision and additional time. EOB, pt able to don socks with set up and SBA while seated. Pt performed grooming tasks standing at sink with set up and SBA-CGA. No LOB, pt tolerated fairly well, denying SOB or fatigue during and after performing. Able to grip and squeeze Poli-grip denture cream onto dentures using R dom hand. After brief seated rest break, pt stood at sink again to perform UB and LB bathing with set up and CGA. No LOB, and pt able to stand without UE support to perform peri hygiene. After standing bathing tasks, pt reporting 6/10 PRE. Once seated, pt educated further in energy conservation strategies including importance of activity pacing, work simplification, and rest breaks to support safety and independence with ADL and IADL tasks. Pt verbalized understanding. Pt set up with breakfast tray in front of her at end of session. Pt was able to open containers/lids without assist, incorporating RUE fully into task. Continue to recommend [place] and 24/7 sup/assist initially upon return home.    Follow Up Recommendations  Home health OT;Supervision/Assistance - 24 hour    Equipment Recommendations  Other (comment)(grab bars)    Recommendations for Other Services      Precautions / Restrictions Precautions Precautions: Fall Restrictions Weight Bearing Restrictions: No       Mobility Bed Mobility Overal bed mobility: Needs Assistance Bed  Mobility: Supine to Sit;Sit to Supine     Supine to sit: HOB elevated;Supervision Sit to supine: Supervision   General bed mobility comments: increased time to perform but no physical assist required  Transfers Overall transfer level: Needs assistance Equipment used: Rolling walker (2 wheeled) Transfers: Sit to/from Stand Sit to Stand: Min guard              Balance Overall balance assessment: Needs assistance Sitting-balance support: No upper extremity supported;Feet unsupported Sitting balance-Leahy Scale: Good     Standing balance support: Single extremity supported;No upper extremity supported;During functional activity Standing balance-Leahy Scale: Fair                             ADL either performed or assessed with clinical judgement   ADL Overall ADL's : Needs assistance/impaired Eating/Feeding: Independent   Grooming: Wash/dry face;Oral care;Wash/dry hands;Standing;Min guard;Set up Grooming Details (indicate cue type and reason): Pt tolerated standing at sink with RW and occasional UE support on sink counter to performing grooming tasks without LOB or significant pt-reported fatigue. Upper Body Bathing: Standing;Set up;Min guard Upper Body Bathing Details (indicate cue type and reason): After rest break, pt performed UB/LB bathing while standing at sink with RW; reporting 6/10 PRE after task - education provided in activity pacing and rest breaks to support recovery, safety, and participation in ADL and IADL tasks. Lower Body Bathing: Sit to/from stand;Min guard;Set up Lower Body Bathing Details (indicate cue type and reason): After rest break, pt performed UB/LB bathing while standing at sink with RW; reporting 6/10 PRE after task - education  provided in activity pacing and rest breaks to support recovery, safety, and participation in ADL and IADL tasks. Upper Body Dressing : Sitting;Set up   Lower Body Dressing: Sit to/from stand;Min guard Lower Body  Dressing Details (indicate cue type and reason): Pt able to perform donning/doffing socks, pants, and undergarments with set up in sitting and CGA in standing - pt educated in strategy to improve energy conservation with dressing tasks                     Vision Baseline Vision/History: Wears glasses(L glass eye) Wears Glasses: At all times Patient Visual Report: No change from baseline     Perception     Praxis      Cognition Arousal/Alertness: Awake/alert Behavior During Therapy: WFL for tasks assessed/performed Overall Cognitive Status: Within Functional Limits for tasks assessed                                          Exercises     Shoulder Instructions       General Comments      Pertinent Vitals/ Pain       Pain Assessment: No/denies pain  Home Living                                          Prior Functioning/Environment              Frequency  Min 2X/week        Progress Toward Goals  OT Goals(current goals can now be found in the care plan section)  Progress towards OT goals: Progressing toward goals  Acute Rehab OT Goals Patient Stated Goal: to return home with daughter OT Goal Formulation: With patient Time For Goal Achievement: 04/24/19 Potential to Achieve Goals: Good  Plan Discharge plan remains appropriate;Frequency remains appropriate    Co-evaluation                 AM-PAC OT "6 Clicks" Daily Activity     Outcome Measure   Help from another person eating meals?: None Help from another person taking care of personal grooming?: A Little Help from another person toileting, which includes using toliet, bedpan, or urinal?: A Little Help from another person bathing (including washing, rinsing, drying)?: A Little Help from another person to put on and taking off regular upper body clothing?: None Help from another person to put on and taking off regular lower body clothing?: A Little 6  Click Score: 20    End of Session Equipment Utilized During Treatment: Rolling walker;Gait belt  OT Visit Diagnosis: Unsteadiness on feet (R26.81);Other abnormalities of gait and mobility (R26.89)   Activity Tolerance Patient tolerated treatment well   Patient Left in bed;with call bell/phone within reach;with bed alarm set   Nurse Communication          Time: [phone] OT Time Calculation (min): 38 min  Charges: OT General Charges $OT Visit: 1 Visit OT Treatments $Self Care/Home Management : 38-52 mins  Jeni Salles, MPH, MS, OTR/L ascom [phone] 04/11/19, 10:00 AM

## 2019-04-11 NOTE — Progress Notes (Signed)
PROGRESS NOTE    [name]  ENI:[id number] DOB: 1932/06/08 DOA: 04/07/2019 PCP: Dierdre Harness, FNP   Brief Narrative:  HPI: [name] is a 86 y.o. female with medical history significant of hypertension, hyperlipidemia, diet-controlled diabetes, stroke, GERD, depression, anxiety, left eye blindness, alcohol abuse in remission, CKD stage III, dCHF, who presents with chest pain.  Patient states that she started having chest pain since yesterday.  She still has some chest pain earlier today, currently chest pain-free.  She states that the chest pain is located in substernal area, pressure-like, moderate, nonradiating.  Associated with mild shortness of breath.  She also has mild dry cough.  No fever or chills.  Patient denies nausea, vomiting, diarrhea, abdominal pain, symptoms of UTI or unilateral weakness.  No recent fall.  No dark stool.  Patient states that she quit drinking alcohol for more than 6 months.  Patient initially had sinus tachycardia with heart rate of 132, which improved to 71 after received 10 mg of Cardizem by IV in ED.  3/26: Patient underwent cardiac catheterization today.  Case discussed with Dr. Rockey Situ.  Catheterization had occluded RCA with collaterals left to right.  Multiple episodes of atrial tachycardia during catheterization.  Lopressor 2.5 mg IV given twice during cath.  Both normal sinus rhythm.  Per cardiology this is likely the underlying factor the patient's symptoms.  3/27: Patient seen and examined.  Status post cardiac catheterization.  Worsening kidney function over interval noted.  Creatinine 1.91 today.  Patient sitting up in bed.  In no visible distress.  Heart rate improved after initiation of beta-blocker per cardiology  3/28: Patient seen and examined.  Worsening kidney function over interval.  Creatinine 2.44 today.  Patient in no visible distress.  Heart rate improved over interval.  Nephrology consult requested.  Recommendations greatly  appreciated.  3/29: Patient seen and examined.  Kidney function slightly improved.  Remains on intravenous fluids.  Patient in no visible distress.  Does endorse some shortness of breath upon exertion.  Discussed with physical therapy.  Patient still exhibiting some minimal right sided upper and lower extremity weakness.   Assessment & Plan:   Principal Problem:   Chest pain Active Problems:   Chronic diastolic heart failure (HCC)   Ischemic stroke (HCC)   Alcohol abuse   GERD without esophagitis   AKI (acute kidney injury) ([place])   CKD (chronic kidney disease), stage IV (HCC)   HTN (hypertension)   HLD (hyperlipidemia)   Elevated troponin   Unstable angina (HCC)   Tachycardia   Coronary artery disease involving native coronary artery of native heart without angina pectoris  Acute on Chronic kidney disease stage IIIa Creatinine 1.14-->1.91-->2.44-->2.29 Suspect ATN secondary to dye exposure Renal ultrasound, no hydro UA bland Plan: Continue intravenous fluid resuscitation Daily creatinine Follow nephrology recommendation  Right-sided weakness, acute onset Patient had some right sided upper and lower extremity weakness noted by nursing staff and physical therapy CT head negative We will order MRI brain without contrast   Unstable angina Status post cardiac catheterization Occluded RCA, collaterals left to right Recommending medical management Per cardiology worsening anginal symptoms likely secondary to atrial tachycardia Plan: Continue beta-blocker, Coreg 25 mg daily Aspirin 81 mg daily High intensity statin  Atrial tachycardia Likely driving her episodic chest pain and unstable angina Beta-blocker advance per cardiology   Hypertensive urgency Coreg 25 mg daily Imdur Hydralazine as needed  History of CVA Continue aspirin and Lipitor  History of alcohol abuse Patient endorses cessation  GERD On Protonix  Difficult IV access Multiple attempts by  multiple different providers were unsuccessful Eventually midline was successful Midline in place in left upper extremity   DVT prophylaxis: Heparin Code Status: Full Family Communication: Granddaughter Salvatore Decent [phone] on 04/11/2019 Disposition Plan: Anticipate return to previous home environment.  Post catheterization day #3.  No complaints of recurrent anginal pain.  Blood pressure heart rate remained reasonably well controlled.  Patient's creatinine is starting to improve.  Nephrology consult appreciated.  Physical therapy noted some persistent right-sided weakness.  MRI brain ordered.  Anticipate discharge within 24 to 48 hours  Consultants:  Cardiology-[facility] [place] [facility]  Procedures:   Left heart catheterization-04/08/2019  Antimicrobials:  none   Subjective: Patient seen and examined Vitals stabilized No complaints this morning   Objective: Vitals:   04/11/19 0228 04/11/19 0830 04/11/19 1059 04/11/19 1120  BP: 118/63 (!) 146/59 (!) 135/51   Pulse: 66 63 (!) 59   Resp: 20 19 19    Temp: 98.2 F (36.8 C) 98.5 F (36.9 C) 98.3 F (36.8 C)   TempSrc:  Oral Oral   SpO2: 99% 93% 98% 94%  Weight:      Height:        Intake/Output Summary (Last 24 hours) at 04/11/2019 1247 Last data filed at 04/11/2019 0842 Gross per 24 hour  Intake 480 ml  Output 400 ml  Net 80 ml   Filed Weights   04/08/19 1533 04/09/19 0143 04/10/19 0418  Weight: 68 kg 65 kg 66.4 kg    Examination:  General exam: Appears calm and comfortable, left eye blindness Respiratory system: Clear to auscultation. Respiratory effort normal. Cardiovascular system: S1 & S2 heard, RRR. No JVD, murmurs, rubs, gallops or clicks. No pedal edema. Gastrointestinal system: Abdomen is nondistended, soft and nontender. No organomegaly or masses felt. Normal bowel sounds heard. Central nervous system: Alert and oriented. No focal neurological deficits. Extremities: Symmetric 5 x  5 power. Skin: No rashes, lesions or ulcers Psychiatry: Judgement and insight appear normal. Mood & affect appropriate.     Data Reviewed: I have personally reviewed following labs and imaging studies  CBC: Recent Labs  Lab 04/07/19 1116 04/10/19 1733 04/11/19 0459  WBC 7.6 6.9 5.3  HGB 12.1 10.1* 9.5*  HCT 37.2 30.0* 29.2*  MCV 91.4 92.3 93.0  PLT 229 176 916   Basic Metabolic Panel: Recent Labs  Lab 04/07/19 1116 04/08/19 0437 04/09/19 0457 04/10/19 0810 04/11/19 0459  NA 140 141 141 137 139  K 4.3 4.5 4.4 4.3 4.1  CL 107 108 109 106 109  CO2 25 20* 23 22 23   GLUCOSE 118* 83 71 83 92  BUN 18 22 29* 34* 35*  CREATININE 1.14* 1.64* 1.91* 2.44* 2.29*  CALCIUM 9.1 9.1 8.5* 8.7* 8.5*  PHOS  --   --   --   --  4.1   GFR: Estimated Creatinine Clearance: 15.6 mL/min (A) (by C-G formula based on SCr of 2.29 mg/dL (H)). Liver Function Tests: Recent Labs  Lab 04/07/19 1116  AST 47*  ALT 27  ALKPHOS 75  BILITOT 0.7  PROT 7.6  ALBUMIN 3.6   No results for input(s): LIPASE, AMYLASE in the last 168 hours. No results for input(s): AMMONIA in the last 168 hours. Coagulation Profile: No results for input(s): INR, PROTIME in the last 168 hours. Cardiac Enzymes: No results for input(s): CKTOTAL, CKMB, CKMBINDEX, TROPONINI in the last 168 hours. BNP (last 3 results) Recent Labs    11/10/18 1516  PROBNP 924*   HbA1C: No results for input(s): HGBA1C in the last 72 hours. CBG: Recent Labs  Lab 04/08/19 0927 04/08/19 1036 04/08/19 1300 04/09/19 1429  GLUCAP 71 89 80 108*   Lipid Profile: No results for input(s): CHOL, HDL, LDLCALC, TRIG, CHOLHDL, LDLDIRECT in the last 72 hours. Thyroid Function Tests: No results for input(s): TSH, T4TOTAL, FREET4, T3FREE, THYROIDAB in the last 72 hours. Anemia Panel: No results for input(s): VITAMINB12, FOLATE, FERRITIN, TIBC, IRON, RETICCTPCT in the last 72 hours. Sepsis Labs: No results for input(s): PROCALCITON,  LATICACIDVEN in the last 168 hours.  Recent Results (from the past 240 hour(s))  Respiratory Panel by RT PCR (Flu A&B, Covid) - Nasopharyngeal Swab     Status: None   Collection Time: 04/07/19 12:54 PM   Specimen: Nasopharyngeal Swab  Result Value Ref Range Status   SARS Coronavirus 2 by RT PCR NEGATIVE NEGATIVE Final    Comment: (NOTE) SARS-CoV-2 target nucleic acids are NOT DETECTED. The SARS-CoV-2 RNA is generally detectable in upper respiratoy specimens during the acute phase of infection. The lowest concentration of SARS-CoV-2 viral copies this assay can detect is 131 copies/mL. A negative result does not preclude SARS-Cov-2 infection and should not be used as the sole basis for treatment or other patient management decisions. A negative result may occur with  improper specimen collection/handling, submission of specimen other than nasopharyngeal swab, presence of viral mutation(s) within the areas targeted by this assay, and inadequate number of viral copies (<131 copies/mL). A negative result must be combined with clinical observations, patient history, and epidemiological information. The expected result is Negative. Fact Sheet for Patients:  PinkCheek.be Fact Sheet for Healthcare Providers:  GravelBags.it This test is not yet ap proved or cleared by the [place] FDA and  has been authorized for detection and/or diagnosis of SARS-CoV-2 by FDA under an Emergency Use Authorization (EUA). This EUA will remain  in effect (meaning this test can be used) for the duration of the COVID-19 declaration under Section 564(b)(1) of the Act, 21 U.S.C. section 360bbb-3(b)(1), unless the authorization is terminated or revoked sooner.    Influenza A by PCR NEGATIVE NEGATIVE Final   Influenza B by PCR NEGATIVE NEGATIVE Final    Comment: (NOTE) The Xpert Xpress SARS-CoV-2/FLU/RSV assay is intended as an aid in  the diagnosis of  influenza from Nasopharyngeal swab specimens and  should not be used as a sole basis for treatment. Nasal washings and  aspirates are unacceptable for Xpert Xpress SARS-CoV-2/FLU/RSV  testing. Fact Sheet for Patients: PinkCheek.be Fact Sheet for Healthcare Providers: GravelBags.it This test is not yet approved or cleared by the [place] FDA and  has been authorized for detection and/or diagnosis of SARS-CoV-2 by  FDA under an Emergency Use Authorization (EUA). This EUA will remain  in effect (meaning this test can be used) for the duration of the  Covid-19 declaration under Section 564(b)(1) of the Act, 21  U.S.C. section 360bbb-3(b)(1), unless the authorization is  terminated or revoked. Performed at [facility], [address], [place], [place] [address]          Radiology Studies: US RENAL  Result Date: 04/10/2019 CLINICAL DATA:  Acute renal failure. EXAM: RENAL / URINARY TRACT ULTRASOUND COMPLETE COMPARISON:  None. FINDINGS: Right Kidney: Renal measurements: 7.9 x 4.4 x 4.7 cm = volume: 87 mL . Echogenicity within normal limits. No mass or hydronephrosis visualized. Left Kidney: Renal measurements: 9.8 x 4.3 x 5.1 cm = volume: 113 mL. Echogenicity  within normal limits. No mass or hydronephrosis visualized. Bladder: Poorly visualized due to overlying bowel gas. Other: None. IMPRESSION: No hydronephrosis. Electronically Signed   By: Lovey Newcomer M.D.   On: 04/10/2019 12:05   DG Chest Port 1 View  Result Date: 04/11/2019 CLINICAL DATA:  Chest pain and shortness of breath. History of hypertension, hyperlipidemia, diet-controlled diabetes, stroke, GERD, depression, anxiety, left eye blindness, alcohol abuse in remission, CKD stage III, dCHF. EXAM: PORTABLE CHEST 1 VIEW COMPARISON:  04/07/2019 FINDINGS: Moderate enlargement of the cardiopericardial silhouette. Atherosclerotic calcification of the aortic arch. Reverse  lordotic frontal projection. Upper zone pulmonary vascular prominence suspicious for pulmonary venous hypertension. No overt edema. No significant blunting of the lateral costophrenic angles. IMPRESSION: Moderate enlargement of the cardiopericardial silhouette with pulmonary venous hypertension, but no overt edema. Electronically Signed   By: Van Clines M.D.   On: 04/11/2019 10:54   CT HEAD CODE STROKE WO CONTRAST`  Result Date: 04/09/2019 CLINICAL DATA:  Code stroke.  Acute neuro deficit.  Rule out stroke. EXAM: CT HEAD WITHOUT CONTRAST TECHNIQUE: Contiguous axial images were obtained from the base of the skull through the vertex without intravenous contrast. COMPARISON:  CT head 01/20/2019 FINDINGS: Brain: Generalized atrophy without hydrocephalus. Moderate chronic microvascular ischemic changes involving the white matter and internal capsule. Chronic infarct left pons unchanged. Chronic infarct right cerebellum unchanged. Negative for acute infarct, hemorrhage, mass Vascular: Normal arterial flow voids. Atherosclerotic calcification in the cavernous carotid bilaterally. Skull: Normal calvarium.  Extensive ossification of the falx. Sinuses/Orbits: Left eye prosthesis.  Visualized sinuses clear Other: None ASPECTS ([place] Stroke Program Early CT Score) - Ganglionic level infarction (caudate, lentiform nuclei, internal capsule, insula, M1-M3 cortex): 7 - Supraganglionic infarction (M4-M6 cortex): 3 Total score (0-10 with 10 being normal): 10 IMPRESSION: 1. Atrophy and chronic microvascular ischemic change. No acute abnormality 2. ASPECTS is 10 3. These results were called by telephone at the time of interpretation on 04/09/2019 at 3:00 pm to provider [facility] , who verbally acknowledged these results. Electronically Signed   By: Franchot Gallo M.D.   On: 04/09/2019 15:00        Scheduled Meds: . aspirin EC  81 mg Oral Daily  . atorvastatin  40 mg Oral q1800  . brimonidine  1 drop Right  Eye BID  . carvedilol  25 mg Oral BID WC  . folic acid  1 mg Oral Daily  . gabapentin  300 mg Oral TID  . heparin  5,000 Units Subcutaneous Q8H  . isosorbide mononitrate  60 mg Oral BID  . latanoprost  1 drop Right Eye QHS  . multivitamin with minerals  1 tablet Oral Daily  . pantoprazole  40 mg Oral Daily  . sodium chloride flush  10-40 mL Intracatheter Q12H  . sodium chloride flush  3 mL Intravenous Q12H  . sodium chloride flush  3 mL Intravenous Q12H  . thiamine  100 mg Oral Daily   Or  . thiamine  100 mg Intravenous Daily   Continuous Infusions: . sodium chloride       LOS: 3 days    Time spent: 35 minutes    Sidney Ace, MD [facility] Pager [phone]  If 7PM-7AM, please contact night-coverage 04/11/2019, 12:47 PM

## 2019-04-11 NOTE — Care Management Important Message (Signed)
Important Message  Patient Details  Name: [name] MRN: [id number] Date of Birth: [birth year]   Medicare Important Message Given:  Yes     Dannette Barbara 04/11/2019, 1:43 PM

## 2019-04-11 NOTE — Progress Notes (Signed)
Physical Therapy Treatment Patient Details Name: [name] MRN: [id number] DOB: [birth year] Today's Date: 04/11/2019    History of Present Illness presented to ER secondary to substernal chest pain, SOB; admitted for management of chest pain with elevated troponin.  s/p cardiac cath 3/26 significant for occluded RCA with collaterals L to R, recommended for medical management.    PT Comments    Pt alert, oriented, agreeable to PT. The patient demonstrated bed mobility with supervision, quick assessment of UE and LE exhibited continued R sided weakness of both UE and LE, MD and RN notified. The patient was able to sit <> stand with RW and CGA, verbal cues for hand placement to increase safety. The patient ambulated ~138ft with RW and CGA. Pt demonstrated some veering to R this session but no LOB noted, able to correct gait path deviations with supervision. Pt up in chair at end of session, all needs in reach. Pt and PT also reviewed importance of continued mobility and therapeutic exercise program to maintain/improve LE strength. Current recommendation remains appropriate.      Follow Up Recommendations  Home health PT;Supervision/Assistance - 24 hour     Equipment Recommendations  Rolling walker with 5" wheels;3in1 (PT)    Recommendations for Other Services       Precautions / Restrictions Precautions Precautions: Fall Restrictions Weight Bearing Restrictions: No    Mobility  Bed Mobility Overal bed mobility: Needs Assistance Bed Mobility: Supine to Sit;Sit to Supine     Supine to sit: HOB elevated;Supervision Sit to supine: Supervision   General bed mobility comments: increased time to perform but no physical assist required  Transfers Overall transfer level: Needs assistance Equipment used: Rolling walker (2 wheeled) Transfers: Sit to/from Stand Sit to Stand: Min guard         General transfer comment: VCs to keep R hand on RW during  mobility  Ambulation/Gait Ambulation/Gait assistance: Min guard Gait Distance (Feet): 180 Feet Assistive device: Rolling walker (2 wheeled)       General Gait Details: Pt occasionally has veering to R but able to self correct, reciprical gait pattern, no LOB noted   Stairs             Wheelchair Mobility    Modified Rankin (Stroke Patients Only)       Balance Overall balance assessment: Needs assistance Sitting-balance support: No upper extremity supported;Feet unsupported Sitting balance-Leahy Scale: Good     Standing balance support: Single extremity supported;No upper extremity supported;During functional activity Standing balance-Leahy Scale: Fair                              Cognition Arousal/Alertness: Awake/alert Behavior During Therapy: WFL for tasks assessed/performed Overall Cognitive Status: Within Functional Limits for tasks assessed                                        Exercises Other Exercises Other Exercises: quick re-assessment of MMT of UE and LE, exhibited decreased strength of both RUE and RLE, decreased R shoulder ROM; unable to complete SLR on RLE Other Exercises: Pt educated on importance of continued mobility and therapeutic exercise program to maximize strength/maintain strength    General Comments        Pertinent Vitals/Pain Pain Assessment: No/denies pain    Home Living  Prior Function            PT Goals (current goals can now be found in the care plan section) Acute Rehab PT Goals Patient Stated Goal: to return home with daughter Progress towards PT goals: Progressing toward goals    Frequency    Min 2X/week      PT Plan Current plan remains appropriate    Co-evaluation              AM-PAC PT "6 Clicks" Mobility   Outcome Measure  Help needed turning from your back to your side while in a flat bed without using bedrails?: None Help needed  moving from lying on your back to sitting on the side of a flat bed without using bedrails?: A Little Help needed moving to and from a bed to a chair (including a wheelchair)?: A Little Help needed standing up from a chair using your arms (e.g., wheelchair or bedside chair)?: A Little Help needed to walk in hospital room?: A Little Help needed climbing 3-5 steps with a railing? : A Little 6 Click Score: 19    End of Session Equipment Utilized During Treatment: Gait belt Activity Tolerance: Patient tolerated treatment well Patient left: in chair;with chair alarm set;with call bell/phone within reach Nurse Communication: Mobility status PT Visit Diagnosis: Muscle weakness (generalized) (M62.81);Difficulty in walking, not elsewhere classified (R26.2)     Time: 4287-6811 PT Time Calculation (min) (ACUTE ONLY): 21 min  Charges:  $Therapeutic Exercise: 8-22 mins                     Lieutenant Diego PT, DPT 11:50 AM,04/11/19

## 2019-04-11 NOTE — TOC Initial Note (Signed)
Transition of Care [facility]) - Initial/Assessment Note    Patient Details  Name: [name] MRN: [id number] Date of Birth: Jun 03, 1932  [facility] CM/SW Contact:    Eileen Stanford, LCSW Phone Number: 04/11/2019, 10:26 AM  Clinical Narrative:     CSW spoke with pt at bedside. Pt states she lives alone and her family lives out of town. Pt states she is comfortable going home with home health. Pt states she has had home health in the past but could not recall which agency. Pt states she has a shower chair, a bedside commode at home. Pt does not have a walker. [employer] notified pt will need a walker prior to d/c. CSW will search for [facility] agency to accept pt.   High risk readmission screening done.              Expected Discharge Plan: [place] Barriers to Discharge: Continued Medical Work up   Patient Goals and CMS Choice Patient states their goals for this hospitalization and ongoing recovery are:: to go home   Choice offered to / list presented to : Patient  Expected Discharge Plan and Services Expected Discharge Plan: [place] In-house Referral: Clinical Social Work   Post Acute Care Choice: [place] arrangements for the past 2 months: [place]                 DME Arranged: Gilford Rile DME Agency: [employer] Date DME Agency Contacted: 04/11/19 Time DME Agency Contacted: 40 Representative spoke with at DME Agency: [place] Arranged: PT, OT          Prior Living Arrangements/Services Living arrangements for the past 2 months: [place] Lives with:: Self Patient language and need for interpreter reviewed:: Yes Do you feel safe going back to the place where you live?: Yes      Need for Family Participation in Patient Care: Yes (Comment) Care giver support system in place?: Yes (comment)(however, pt's family lives out of town)   Criminal Activity/Legal Involvement Pertinent to Current  Situation/Hospitalization: No - Comment as needed  Activities of Daily Living Home Assistive Devices/Equipment: Radio producer (specify quad or straight) ADL Screening (condition at time of admission) Patient's cognitive ability adequate to safely complete daily activities?: Yes Is the patient deaf or have difficulty hearing?: No Does the patient have difficulty seeing, even when wearing glasses/contacts?: No Does the patient have difficulty concentrating, remembering, or making decisions?: No Patient able to express need for assistance with ADLs?: Yes Does the patient have difficulty dressing or bathing?: No Independently performs ADLs?: Yes (appropriate for developmental age) Does the patient have difficulty walking or climbing stairs?: Yes Weakness of Legs: Right Weakness of Arms/Hands: Left  Permission Sought/Granted Permission sought to share information with : Family Supports Permission granted to share information with : Yes, Verbal Permission Granted  Share Information with NAME: [name]     Permission granted to share info w Relationship: granddaughter  Permission granted to share info w Contact Information: [phone]  Emotional Assessment Appearance:: Appears stated age Attitude/Demeanor/Rapport: Engaged Affect (typically observed): Accepting, Appropriate, Calm Orientation: : Oriented to Self, Oriented to Place, Oriented to  Time, Oriented to Situation Alcohol / Substance Use: Not Applicable Psych Involvement: No (comment)  Admission diagnosis:  Tachycardia [R00.0] Elevated troponin I level [R77.8] Chest pain [R07.9] Nonspecific chest pain [R07.9] Unstable angina (HCC) [I20.0] Patient Active Problem List   Diagnosis Date Noted  . Unstable angina ([place]) 04/08/2019  .  CKD (chronic kidney disease), stage IV ([place]) 04/07/2019  . HTN (hypertension) 04/07/2019  . HLD (hyperlipidemia) 04/07/2019  . Elevated troponin 04/07/2019  . Acute kidney injury superimposed on chronic kidney  disease ([place]) 11/01/2018  . Leukocytosis 02/22/2018  . Influenza A 02/21/2018  . Wheezy bronchitis 02/21/2018  . Acute wheezy bronchitis 02/21/2018  . Hypertension associated with stage 3 chronic kidney disease due to type 2 diabetes mellitus ([place]) 06/09/2017  . Dyslipidemia associated with type 2 diabetes mellitus ([place]) 06/09/2017  . Stage 3 chronic kidney disease due to type 2 diabetes mellitus ([place]) 06/09/2017  . Peripheral sensory neuropathy due to type 2 diabetes mellitus ([place]) 06/09/2017  . GERD without esophagitis 06/09/2017  . Glaucoma due to type 2 diabetes mellitus ([place]) 06/09/2017  . Alcohol abuse 06/04/2017  . Ischemic stroke ([place]) 06/02/2017  . Type 2 diabetes mellitus with vascular disease ([place]) 06/02/2017  . Chest pain 06/02/2017  . Closed non-physeal fracture of metatarsal bone of right foot 07/31/2016  . Hypertensive urgency 02/17/2014  . Chronic diastolic heart failure ([place]) 03/15/2013  . Closed left hip fracture ([place]) 07/17/2011   PCP:  Dierdre Harness, FNP Pharmacy:   [facility] [place], [address] AT [address] [place] [address] Phone: [phone] Fax: [phone]     Social Determinants of Health (SDOH) Interventions    Readmission Risk Interventions Readmission Risk Prevention Plan 04/11/2019  Transportation Screening Complete  PCP or Specialist Appt within 3-5 Days Complete  HRI or [place] Complete  Social Work Consult for [place] Planning/Counseling Complete  Palliative Care Screening Not Applicable  Medication Review Press photographer) Complete  Some recent data might be hidden

## 2019-04-11 NOTE — Progress Notes (Signed)
Progress Note  Patient Name: [name] Date of Encounter: 04/11/2019  Primary Cardiologist: Sinclair Grooms, MD   Subjective   Patient denies chest pain or shortness of breath.  No acute events overnight.  Inpatient Medications    Scheduled Meds: . aspirin EC  81 mg Oral Daily  . atorvastatin  40 mg Oral q1800  . brimonidine  1 drop Right Eye BID  . carvedilol  25 mg Oral BID WC  . folic acid  1 mg Oral Daily  . gabapentin  300 mg Oral TID  . heparin  5,000 Units Subcutaneous Q8H  . isosorbide mononitrate  60 mg Oral BID  . latanoprost  1 drop Right Eye QHS  . multivitamin with minerals  1 tablet Oral Daily  . pantoprazole  40 mg Oral Daily  . sodium chloride flush  10-40 mL Intracatheter Q12H  . sodium chloride flush  3 mL Intravenous Q12H  . sodium chloride flush  3 mL Intravenous Q12H  . thiamine  100 mg Oral Daily   Or  . thiamine  100 mg Intravenous Daily   Continuous Infusions: . sodium chloride     PRN Meds: sodium chloride, acetaminophen, acetaminophen, hydrALAZINE, morphine injection, nitroGLYCERIN, ondansetron (ZOFRAN) IV, ondansetron (ZOFRAN) IV, sodium chloride flush, sodium chloride flush   Vital Signs    Vitals:   04/10/19 1646 04/10/19 1949 04/11/19 0228 04/11/19 0830  BP: (!) 159/72 (!) 150/58 118/63 (!) 146/59  Pulse: 60 60 66 63  Resp: 16 20 20 19   Temp: 97.6 F (36.4 C) 98.7 F (37.1 C) 98.2 F (36.8 C) 98.5 F (36.9 C)  TempSrc:  Oral  Oral  SpO2: 95% 92% 99% 93%  Weight:      Height:        Intake/Output Summary (Last 24 hours) at 04/11/2019 1027 Last data filed at 04/11/2019 0842 Gross per 24 hour  Intake 480 ml  Output 400 ml  Net 80 ml   Last 3 Weights 04/10/2019 04/09/2019 04/08/2019  Weight (lbs) 146 lb 4.8 oz 143 lb 4.8 oz 149 lb 14.4 oz  Weight (kg) 66.361 kg 65 kg 67.994 kg      Telemetry    sinus - Personally Reviewed   Physical Exam   GEN: No acute distress.   Neck: No JVD Cardiac: RRR, no murmurs,  rubs, or gallops.  Respiratory: Clear to auscultation bilaterally. GI: Soft, nontender, non-distended  MS: No edema; No deformity. Neuro:  Nonfocal  Psych: Normal affect   Labs    High Sensitivity Troponin:   Recent Labs  Lab 04/07/19 1116 04/07/19 1254 04/07/19 1622 04/07/19 1901  TROPONINIHS 60* 71* 88* 81*      Chemistry Recent Labs  Lab 04/07/19 1116 04/08/19 0437 04/09/19 0457 04/10/19 0810 04/11/19 0459  NA 140   < > 141 137 139  K 4.3   < > 4.4 4.3 4.1  CL 107   < > 109 106 109  CO2 25   < > 23 22 23   GLUCOSE 118*   < > 71 83 92  BUN 18   < > 29* 34* 35*  CREATININE 1.14*   < > 1.91* 2.44* 2.29*  CALCIUM 9.1   < > 8.5* 8.7* 8.5*  PROT 7.6  --   --   --   --   ALBUMIN 3.6  --   --   --   --   AST 47*  --   --   --   --  ALT 27  --   --   --   --   ALKPHOS 75  --   --   --   --   BILITOT 0.7  --   --   --   --   GFRNONAA 44*   < > 24* 18* 19*  GFRAA 51*   < > 27* 20* 22*  ANIONGAP 8   < > 9 9 7    < > = values in this interval not displayed.     Hematology Recent Labs  Lab 04/07/19 1116 04/10/19 1733 04/11/19 0459  WBC 7.6 6.9 5.3  RBC 4.07 3.25* 3.14*  HGB 12.1 10.1* 9.5*  HCT 37.2 30.0* 29.2*  MCV 91.4 92.3 93.0  MCH 29.7 31.1 30.3  MCHC 32.5 33.7 32.5  RDW 14.5 14.1 14.2  PLT 229 176 195    BNP Recent Labs  Lab 04/07/19 1307  BNP 1,028.0*     DDimer No results for input(s): DDIMER in the last 168 hours.   Radiology    US RENAL  Result Date: 04/10/2019 CLINICAL DATA:  Acute renal failure. EXAM: RENAL / URINARY TRACT ULTRASOUND COMPLETE COMPARISON:  None. FINDINGS: Right Kidney: Renal measurements: 7.9 x 4.4 x 4.7 cm = volume: 87 mL . Echogenicity within normal limits. No mass or hydronephrosis visualized. Left Kidney: Renal measurements: 9.8 x 4.3 x 5.1 cm = volume: 113 mL. Echogenicity within normal limits. No mass or hydronephrosis visualized. Bladder: Poorly visualized due to overlying bowel gas. Other: None. IMPRESSION: No  hydronephrosis. Electronically Signed   By: Lovey Newcomer M.D.   On: 04/10/2019 12:05   CT HEAD CODE STROKE WO CONTRAST`  Result Date: 04/09/2019 CLINICAL DATA:  Code stroke.  Acute neuro deficit.  Rule out stroke. EXAM: CT HEAD WITHOUT CONTRAST TECHNIQUE: Contiguous axial images were obtained from the base of the skull through the vertex without intravenous contrast. COMPARISON:  CT head 01/20/2019 FINDINGS: Brain: Generalized atrophy without hydrocephalus. Moderate chronic microvascular ischemic changes involving the white matter and internal capsule. Chronic infarct left pons unchanged. Chronic infarct right cerebellum unchanged. Negative for acute infarct, hemorrhage, mass Vascular: Normal arterial flow voids. Atherosclerotic calcification in the cavernous carotid bilaterally. Skull: Normal calvarium.  Extensive ossification of the falx. Sinuses/Orbits: Left eye prosthesis.  Visualized sinuses clear Other: None ASPECTS ([place] Stroke Program Early CT Score) - Ganglionic level infarction (caudate, lentiform nuclei, internal capsule, insula, M1-M3 cortex): 7 - Supraganglionic infarction (M4-M6 cortex): 3 Total score (0-10 with 10 being normal): 10 IMPRESSION: 1. Atrophy and chronic microvascular ischemic change. No acute abnormality 2. ASPECTS is 10 3. These results were called by telephone at the time of interpretation on 04/09/2019 at 3:00 pm to provider [facility] , who verbally acknowledged these results. Electronically Signed   By: Franchot Gallo M.D.   On: 04/09/2019 15:00    Cardiac Studies   TTE 11/02/2018 1. Left ventricular ejection fraction, by visual estimation, is 60 to  65%. The left ventricle has normal function. Normal left ventricular size.  Left ventricular septal wall thickness was mildly increased. Mildly  increased left ventricular posterior  wall thickness.  2. Left ventricular diastolic Doppler parameters are consistent with  impaired relaxation pattern of LV diastolic  filling.  3. Elevated left ventricular end-diastolic pressure.  4. Global right ventricle has normal systolic function.The right  ventricular size is normal. No increase in right ventricular wall  thickness.  5. Left atrial size was moderately dilated.  6. Right atrial size was normal.  7. Moderate mitral annular calcification.  8. The mitral valve is normal in structure. Trace mitral valve  regurgitation. No evidence of mitral stenosis.  9. The tricuspid valve is normal in structure. Tricuspid valve  regurgitation is mild.  10. The aortic valve is tricuspid. Aortic valve regurgitation is trivial  by color flow Doppler. Moderate aortic valve sclerosis/calcification  without any evidence of aortic stenosis.  11. The pulmonic valve was normal in structure. Pulmonic valve  regurgitation is mild by color flow Doppler.  12. Normal pulmonary artery systolic pressure.  13. The inferior vena cava is normal in size with greater than 50%  respiratory variability, suggesting right atrial pressure of 3 mmHg.   LHC 04/08/2019  Ost RCA to Mid RCA lesion is 100% stenosed.  Prox Cx lesion is 30% stenosed.  Mid LAD lesion is 30% stenosed.  Patient Profile     86 y.o. female history of chronic chest pain, hypertension, hyperlipidemia, stage III kidney disease, diabetes, presenting with chest pain, hypertensive urgency, elevated troponin.  Assessment & Plan    1. SVT -no further occurrence -cont coreg  2. Hx of CAD. occ RCA -cont asa, statin, coreg, imdur  3. Renal dysfunction -likely atn post cath -Cr slightly improved today. Cont to monitor -avoid nephrotoxic agents      Signed, Kate Sable, MD  04/11/2019, 10:27 AM

## 2019-04-11 NOTE — Progress Notes (Signed)
[facility]  ROUNDING NOTE   Subjective:  Patient resting comfortably in bed. Complains of fatigue and tiredness. Renal function slightly better today.  Creatinine currently 2.2. Patient has atrophic right kidney.  Objective:  Vital signs in last 24 hours:  Temp:  [98.1 F (36.7 C)-98.7 F (37.1 C)] 98.1 F (36.7 C) (03/29 1526) Pulse Rate:  [59-71] 71 (03/29 1734) Resp:  [19-20] 19 (03/29 1526) BP: (118-156)/(51-75) 156/75 (03/29 1526) SpO2:  [92 %-99 %] 98 % (03/29 1526)  Weight change:  Filed Weights   04/08/19 1533 04/09/19 0143 04/10/19 0418  Weight: 68 kg 65 kg 66.4 kg    Intake/Output: I/O last 3 completed shifts: In: 24 [P.O.:720; I.V.:10] Out: 600 [Urine:600]   Intake/Output this shift:  No intake/output data recorded.  Physical Exam: General: No acute distress  Head: Normocephalic, atraumatic. Moist oral mucosal membranes  Eyes: Anicteric  Neck: Supple, trachea midline  Lungs:  Clear to auscultation, normal effort  Heart: S1S2 no rubs  Abdomen:  Soft, nontender, bowel sounds present  Extremities: No peripheral edema.  Neurologic: Awake, alert, following commands  Skin: No lesions       Basic Metabolic Panel: Recent Labs  Lab 04/07/19 1116 04/07/19 1116 04/08/19 0437 04/08/19 0437 04/09/19 0457 04/10/19 0810 04/11/19 0459  NA 140  --  141  --  141 137 139  K 4.3  --  4.5  --  4.4 4.3 4.1  CL 107  --  108  --  109 106 109  CO2 25  --  20*  --  23 22 23   GLUCOSE 118*  --  83  --  71 83 92  BUN 18  --  22  --  29* 34* 35*  CREATININE 1.14*  --  1.64*  --  1.91* 2.44* 2.29*  CALCIUM 9.1   < > 9.1   < > 8.5* 8.7* 8.5*  PHOS  --   --   --   --   --   --  4.1   < > = values in this interval not displayed.    Liver Function Tests: Recent Labs  Lab 04/07/19 1116  AST 47*  ALT 27  ALKPHOS 75  BILITOT 0.7  PROT 7.6  ALBUMIN 3.6   No results for input(s): LIPASE, AMYLASE in the last 168 hours. No results for input(s):  AMMONIA in the last 168 hours.  CBC: Recent Labs  Lab 04/07/19 1116 04/10/19 1733 04/11/19 0459  WBC 7.6 6.9 5.3  HGB 12.1 10.1* 9.5*  HCT 37.2 30.0* 29.2*  MCV 91.4 92.3 93.0  PLT 229 176 195    Cardiac Enzymes: No results for input(s): CKTOTAL, CKMB, CKMBINDEX, TROPONINI in the last 168 hours.  BNP: Invalid input(s): POCBNP  CBG: Recent Labs  Lab 04/08/19 0927 04/08/19 1036 04/08/19 1300 04/09/19 1429  GLUCAP 71 89 80 108*    Microbiology: Results for orders placed or performed during the hospital encounter of 04/07/19  Respiratory Panel by RT PCR (Flu A&B, Covid) - Nasopharyngeal Swab     Status: None   Collection Time: 04/07/19 12:54 PM   Specimen: Nasopharyngeal Swab  Result Value Ref Range Status   SARS Coronavirus 2 by RT PCR NEGATIVE NEGATIVE Final    Comment: (NOTE) SARS-CoV-2 target nucleic acids are NOT DETECTED. The SARS-CoV-2 RNA is generally detectable in upper respiratoy specimens during the acute phase of infection. The lowest concentration of SARS-CoV-2 viral copies this assay can detect is 131 copies/mL. A negative result  does not preclude SARS-Cov-2 infection and should not be used as the sole basis for treatment or other patient management decisions. A negative result may occur with  improper specimen collection/handling, submission of specimen other than nasopharyngeal swab, presence of viral mutation(s) within the areas targeted by this assay, and inadequate number of viral copies (<131 copies/mL). A negative result must be combined with clinical observations, patient history, and epidemiological information. The expected result is Negative. Fact Sheet for Patients:  PinkCheek.be Fact Sheet for Healthcare Providers:  GravelBags.it This test is not yet ap proved or cleared by the [place] FDA and  has been authorized for detection and/or diagnosis of SARS-CoV-2 by FDA under an  Emergency Use Authorization (EUA). This EUA will remain  in effect (meaning this test can be used) for the duration of the COVID-19 declaration under Section 564(b)(1) of the Act, 21 U.S.C. section 360bbb-3(b)(1), unless the authorization is terminated or revoked sooner.    Influenza A by PCR NEGATIVE NEGATIVE Final   Influenza B by PCR NEGATIVE NEGATIVE Final    Comment: (NOTE) The Xpert Xpress SARS-CoV-2/FLU/RSV assay is intended as an aid in  the diagnosis of influenza from Nasopharyngeal swab specimens and  should not be used as a sole basis for treatment. Nasal washings and  aspirates are unacceptable for Xpert Xpress SARS-CoV-2/FLU/RSV  testing. Fact Sheet for Patients: PinkCheek.be Fact Sheet for Healthcare Providers: GravelBags.it This test is not yet approved or cleared by the [place] FDA and  has been authorized for detection and/or diagnosis of SARS-CoV-2 by  FDA under an Emergency Use Authorization (EUA). This EUA will remain  in effect (meaning this test can be used) for the duration of the  Covid-19 declaration under Section 564(b)(1) of the Act, 21  U.S.C. section 360bbb-3(b)(1), unless the authorization is  terminated or revoked. Performed at [facility], [address] [place], [place] [address]     Coagulation Studies: No results for input(s): LABPROT, INR in the last 72 hours.  Urinalysis: Recent Labs    04/10/19 2244  COLORURINE YELLOW*  LABSPEC 1.013  PHURINE 5.0  GLUCOSEU NEGATIVE  HGBUR NEGATIVE  BILIRUBINUR NEGATIVE  KETONESUR NEGATIVE  PROTEINUR 30*  NITRITE POSITIVE*  LEUKOCYTESUR LARGE*      Imaging: MR BRAIN WO CONTRAST  Result Date: 04/11/2019 CLINICAL DATA:  Focal neuro deficit, greater than 6 hours. Right-sided weakness. EXAM: MRI HEAD WITHOUT CONTRAST TECHNIQUE: Multiplanar, multiecho pulse sequences of the brain and surrounding structures were obtained  without intravenous contrast. COMPARISON:  CT head without contrast 04/09/2019 FINDINGS: Brain: Multifocal areas of acute/subacute nonhemorrhagic infarction are present bilaterally. The largest cortical area involves the precentral and postcentral gyrus on the left, corresponding with right-sided weakness. A radial white matter infarct is present subjacent to the left middle frontal gyrus. Additional small cortical foci are present in the parietal lobe, the left occipital lobe of the right parietoccipital lobe, and the left posterior hippocampus. A 5 mm focus is present at the inferior right cerebellum. A more posterior remote right cerebellar infarct is present. Remote punctate foci are present in the left cerebellum and left paramedian brainstem. Confluent periventricular white matter changes extend into the internal capsule bilaterally. No hemorrhage or mass lesion is present. Vascular: Flow is present in the major intracranial arteries. Skull and upper cervical spine: The craniocervical junction is normal. Upper cervical spine is within normal limits. Marrow signal is unremarkable. Extensive ossification the inter cerebral falx is again noted. Sinuses/Orbits: Bilateral mastoid effusions are present. No  obstructing nasopharyngeal lesion is present. The paranasal sinuses and mastoid air cells are otherwise clear. The left globe has been replaced. Right globe bilateral orbits are otherwise normal. : 1. Multifocal areas of acute/subacute nonhemorrhagic infarction involving the left frontal, parietal, and occipital lobes as described. Involvement of the left precentral gyrus corresponds with the right-sided weakness. 2. Additional punctate foci of acute nonhemorrhagic infarct involving the inferior right cerebellum and left posterior hippocampus. 3. Remote punctate infarcts involving the left paramedian brainstem, left cerebellum, and left paramedian brainstem. 4. Extensive white matter disease likely reflects the  sequela of chronic microvascular ischemia. 5. Bilateral mastoid effusions. No obstructing nasopharyngeal lesion is present. Electronically Signed   By: San Morelle M.D.   On: 04/11/2019 14:14   US RENAL  Result Date: 04/10/2019 CLINICAL DATA:  Acute renal failure. EXAM: RENAL / URINARY TRACT ULTRASOUND COMPLETE COMPARISON:  None. FINDINGS: Right Kidney: Renal measurements: 7.9 x 4.4 x 4.7 cm = volume: 87 mL . Echogenicity within normal limits. No mass or hydronephrosis visualized. Left Kidney: Renal measurements: 9.8 x 4.3 x 5.1 cm = volume: 113 mL. Echogenicity within normal limits. No mass or hydronephrosis visualized. Bladder: Poorly visualized due to overlying bowel gas. Other: None. IMPRESSION: No hydronephrosis. Electronically Signed   By: Lovey Newcomer M.D.   On: 04/10/2019 12:05   DG Chest Port 1 View  Result Date: 04/11/2019 CLINICAL DATA:  Chest pain and shortness of breath. History of hypertension, hyperlipidemia, diet-controlled diabetes, stroke, GERD, depression, anxiety, left eye blindness, alcohol abuse in remission, CKD stage III, dCHF. EXAM: PORTABLE CHEST 1 VIEW COMPARISON:  04/07/2019 FINDINGS: Moderate enlargement of the cardiopericardial silhouette. Atherosclerotic calcification of the aortic arch. Reverse lordotic frontal projection. Upper zone pulmonary vascular prominence suspicious for pulmonary venous hypertension. No overt edema. No significant blunting of the lateral costophrenic angles. IMPRESSION: Moderate enlargement of the cardiopericardial silhouette with pulmonary venous hypertension, but no overt edema. Electronically Signed   By: Van Clines M.D.   On: 04/11/2019 10:54     Medications:   . sodium chloride     . aspirin EC  81 mg Oral Daily  . atorvastatin  40 mg Oral q1800  . brimonidine  1 drop Right Eye BID  . carvedilol  25 mg Oral BID WC  . folic acid  1 mg Oral Daily  . gabapentin  300 mg Oral TID  . heparin  5,000 Units Subcutaneous Q8H   . isosorbide mononitrate  60 mg Oral BID  . latanoprost  1 drop Right Eye QHS  . multivitamin with minerals  1 tablet Oral Daily  . pantoprazole  40 mg Oral Daily  . sodium chloride flush  10-40 mL Intracatheter Q12H  . sodium chloride flush  3 mL Intravenous Q12H  . sodium chloride flush  3 mL Intravenous Q12H  . thiamine  100 mg Oral Daily   Or  . thiamine  100 mg Intravenous Daily   sodium chloride, acetaminophen, hydrALAZINE, morphine injection, nitroGLYCERIN, ondansetron (ZOFRAN) IV, ondansetron (ZOFRAN) IV, sodium chloride flush, sodium chloride flush  Assessment/ Plan:  86 y.o. female with past medical history of coronary artery disease, left eye blindness, chronic kidney disease stage III, diastolic heart failure, GERD, hyperlipidemia, hypertension, CVA, diabetes mellitus type 2 who presented with chest pain.  1.  Acute kidney injury/chronic kidney disease stage III yea baseline EGFR 54/diabetes mellitus type 2 with chronic kidney disease.  Suspect acute kidney injury likely related to contrast versus hypotension.  Renal function slightly better today.  Avoid further nephrotoxins as possible.  Also has a known atrophic right kidney.  2.  Anemia chronic kidney disease.  Hemoglobin currently 9.5.  Hold off on Epogen at this time.  3.  Hypertension.  Maintain the patient on carvedilol and isosorbide mononitrate for now.   LOS: 3 Lummie Montijo 3/29/20217:26 PM

## 2019-04-12 ENCOUNTER — Inpatient Hospital Stay: Payer: Medicare Other

## 2019-04-12 ENCOUNTER — Inpatient Hospital Stay (HOSPITAL_COMMUNITY)
Admit: 2019-04-12 | Discharge: 2019-04-12 | Disposition: A | Discharge status: Home / Self Care | Payer: Medicare Other | Attending: Neurology | Admitting: Neurology

## 2019-04-12 DIAGNOSIS — I361 Nonrheumatic tricuspid (valve) insufficiency: Secondary | ICD-10-CM

## 2019-04-12 DIAGNOSIS — I340 Nonrheumatic mitral (valve) insufficiency: Secondary | ICD-10-CM

## 2019-04-12 DIAGNOSIS — I639 Cerebral infarction, unspecified: Secondary | ICD-10-CM

## 2019-04-12 DIAGNOSIS — I5032 Chronic diastolic (congestive) heart failure: Secondary | ICD-10-CM

## 2019-04-12 DIAGNOSIS — R079 Chest pain, unspecified: Secondary | ICD-10-CM

## 2019-04-12 DIAGNOSIS — I351 Nonrheumatic aortic (valve) insufficiency: Secondary | ICD-10-CM

## 2019-04-12 LAB — BASIC METABOLIC PANEL
Anion gap: 9 (ref 5–15)
BUN: 33 mg/dL — ABNORMAL HIGH (ref 8–23)
CO2: 22 mmol/L (ref 22–32)
Calcium: 8.8 mg/dL — ABNORMAL LOW (ref 8.9–10.3)
Chloride: 105 mmol/L (ref 98–111)
Creatinine, Ser: 2.2 mg/dL — ABNORMAL HIGH (ref 0.44–1.00)
GFR calc Af Amer: 23 mL/min — ABNORMAL LOW (ref >60)
GFR calc non Af Amer: 20 mL/min — ABNORMAL LOW (ref >60)
Glucose, Bld: 94 mg/dL (ref 70–99)
Potassium: 4.2 mmol/L (ref 3.5–5.1)
Sodium: 136 mmol/L (ref 135–145)

## 2019-04-12 LAB — CBC
HCT: 28.3 % — ABNORMAL LOW (ref 36.0–46.0)
Hemoglobin: 9.1 g/dL — ABNORMAL LOW (ref 12.0–15.0)
MCH: 30.1 pg (ref 26.0–34.0)
MCHC: 32.2 g/dL (ref 30.0–36.0)
MCV: 93.7 fL (ref 80.0–100.0)
Platelets: 180 10*3/uL (ref 150–400)
RBC: 3.02 MIL/uL — ABNORMAL LOW (ref 3.87–5.11)
RDW: 14 % (ref 11.5–15.5)
WBC: 5.1 10*3/uL (ref 4.0–10.5)
nRBC: 0 % (ref 0.0–0.2)

## 2019-04-12 LAB — EXPECTORATED SPUTUM ASSESSMENT W GRAM STAIN, RFLX TO RESP C

## 2019-04-12 LAB — COMPLEMENT, TOTAL: Compl, Total (CH50): 18 U/mL — ABNORMAL LOW (ref >41)

## 2019-04-12 LAB — C4 COMPLEMENT: Complement C4, Body Fluid: 34 mg/dL (ref 12–38)

## 2019-04-12 LAB — PROCALCITONIN: Procalcitonin: <0.1 ng/mL

## 2019-04-12 LAB — C3 COMPLEMENT: C3 Complement: 117 mg/dL (ref 82–167)

## 2019-04-12 MED ORDER — SODIUM CHLORIDE 0.9 % IV SOLN
1.0000 g | INTRAVENOUS | Status: DC
  Administered 2019-04-12 – 2019-04-13 (×2): 1 g via INTRAVENOUS
  Filled 2019-04-12: qty 10
  Filled 2019-04-12: qty 1
  Filled 2019-04-12: qty 10

## 2019-04-12 MED ORDER — CLOPIDOGREL BISULFATE 75 MG PO TABS
75.0000 mg | ORAL_TABLET | Freq: Every day | ORAL | Status: DC
  Administered 2019-04-12 – 2019-04-13 (×2): 75 mg via ORAL
  Filled 2019-04-12 (×2): qty 1

## 2019-04-12 MED ORDER — METRONIDAZOLE IN NACL 5-0.79 MG/ML-% IV SOLN
500.0000 mg | Freq: Three times a day (TID) | INTRAVENOUS | Status: DC
  Administered 2019-04-12 – 2019-04-13 (×4): 500 mg via INTRAVENOUS
  Filled 2019-04-12 (×7): qty 100

## 2019-04-12 MED ORDER — AMLODIPINE BESYLATE 5 MG PO TABS
2.5000 mg | ORAL_TABLET | Freq: Every day | ORAL | Status: DC
  Administered 2019-04-12: 2.5 mg via ORAL
  Filled 2019-04-12: qty 1

## 2019-04-12 NOTE — Progress Notes (Addendum)
Progress Note  Patient Name: [name] Date of Encounter: 04/12/2019  Primary Cardiologist: Sinclair Grooms, MD   Subjective   No chest pain. Denies any fevers but does report that she is coughing when eating and sniffles / cough / sneezing appreciated during today's exam.  Inpatient Medications    Scheduled Meds: . aspirin EC  81 mg Oral Daily  . atorvastatin  40 mg Oral q1800  . brimonidine  1 drop Right Eye BID  . carvedilol  25 mg Oral BID WC  . folic acid  1 mg Oral Daily  . gabapentin  300 mg Oral TID  . heparin  5,000 Units Subcutaneous Q8H  . isosorbide mononitrate  60 mg Oral BID  . latanoprost  1 drop Right Eye QHS  . multivitamin with minerals  1 tablet Oral Daily  . pantoprazole  40 mg Oral Daily  . sodium chloride flush  10-40 mL Intracatheter Q12H  . thiamine  100 mg Oral Daily   Or  . thiamine  100 mg Intravenous Daily   Continuous Infusions: . sodium chloride     PRN Meds: sodium chloride, acetaminophen, hydrALAZINE, morphine injection, nitroGLYCERIN, ondansetron (ZOFRAN) IV, ondansetron (ZOFRAN) IV, sodium chloride flush   Vital Signs    Vitals:   04/11/19 2348 04/12/19 0335 04/12/19 0338 04/12/19 0814  BP: (!) 143/68  98/64 (!) 165/76  Pulse: (!) 57  61 62  Resp: 19  20 18   Temp: 97.8 F (36.6 C)  97.7 F (36.5 C) 98.5 F (36.9 C)  TempSrc: Oral  Oral   SpO2: 98%  98% 97%  Weight:  68.5 kg    Height:        Intake/Output Summary (Last 24 hours) at 04/12/2019 0837 Last data filed at 04/12/2019 0813 Gross per 24 hour  Intake --  Output 800 ml  Net -800 ml   Last 3 Weights 04/12/2019 04/10/2019 04/09/2019  Weight (lbs) 151 lb 146 lb 4.8 oz 143 lb 4.8 oz  Weight (kg) 68.493 kg 66.361 kg 65 kg      Telemetry    NSR, PACs/ectopy - Personally Reviewed  ECG    No new tracings - Personally Reviewed  Physical Exam   GEN: No acute distress.  Frequent coughing and sneezing. Neck: JVP difficult to assess due to frequent coughing  and sneezing Cardiac: RRR, 1/6 systolic murmur. No rubs, or gallops.  Respiratory: Bibasilar rhonchi and bilateral wheeze appreciated. GI: Soft, nontender, non-distended  MS: No edema; No deformity. Neuro:  Nonfocal  Psych: Normal affect   Labs    High Sensitivity Troponin:   Recent Labs  Lab 04/07/19 1116 04/07/19 1254 04/07/19 1622 04/07/19 1901  TROPONINIHS 60* 71* 88* 81*      Chemistry Recent Labs  Lab 04/07/19 1116 04/08/19 0437 04/10/19 0810 04/11/19 0459 04/12/19 0718  NA 140   < > 137 139 136  K 4.3   < > 4.3 4.1 4.2  CL 107   < > 106 109 105  CO2 25   < > 22 23 22   GLUCOSE 118*   < > 83 92 94  BUN 18   < > 34* 35* 33*  CREATININE 1.14*   < > 2.44* 2.29* 2.20*  CALCIUM 9.1   < > 8.7* 8.5* 8.8*  PROT 7.6  --   --   --   --   ALBUMIN 3.6  --   --   --   --   AST 47*  --   --   --   --  ALT 27  --   --   --   --   ALKPHOS 75  --   --   --   --   BILITOT 0.7  --   --   --   --   GFRNONAA 44*   < > 18* 19* 20*  GFRAA 51*   < > 20* 22* 23*  ANIONGAP 8   < > 9 7 9    < > = values in this interval not displayed.     Hematology Recent Labs  Lab 04/10/19 1733 04/11/19 0459 04/12/19 0718  WBC 6.9 5.3 5.1  RBC 3.25* 3.14* 3.02*  HGB 10.1* 9.5* 9.1*  HCT 30.0* 29.2* 28.3*  MCV 92.3 93.0 93.7  MCH 31.1 30.3 30.1  MCHC 33.7 32.5 32.2  RDW 14.1 14.2 14.0  PLT 176 195 180    BNP Recent Labs  Lab 04/07/19 1307  BNP 1,028.0*     DDimer No results for input(s): DDIMER in the last 168 hours.   Radiology    MR BRAIN WO CONTRAST  Result Date: 04/11/2019 CLINICAL DATA:  Focal neuro deficit, greater than 6 hours. Right-sided weakness. EXAM: MRI HEAD WITHOUT CONTRAST TECHNIQUE: Multiplanar, multiecho pulse sequences of the brain and surrounding structures were obtained without intravenous contrast. COMPARISON:  CT head without contrast 04/09/2019 FINDINGS: Brain: Multifocal areas of acute/subacute nonhemorrhagic infarction are present bilaterally. The  largest cortical area involves the precentral and postcentral gyrus on the left, corresponding with right-sided weakness. A radial white matter infarct is present subjacent to the left middle frontal gyrus. Additional small cortical foci are present in the parietal lobe, the left occipital lobe of the right parietoccipital lobe, and the left posterior hippocampus. A 5 mm focus is present at the inferior right cerebellum. A more posterior remote right cerebellar infarct is present. Remote punctate foci are present in the left cerebellum and left paramedian brainstem. Confluent periventricular white matter changes extend into the internal capsule bilaterally. No hemorrhage or mass lesion is present. Vascular: Flow is present in the major intracranial arteries. Skull and upper cervical spine: The craniocervical junction is normal. Upper cervical spine is within normal limits. Marrow signal is unremarkable. Extensive ossification the inter cerebral falx is again noted. Sinuses/Orbits: Bilateral mastoid effusions are present. No obstructing nasopharyngeal lesion is present. The paranasal sinuses and mastoid air cells are otherwise clear. The left globe has been replaced. Right globe bilateral orbits are otherwise normal. : 1. Multifocal areas of acute/subacute nonhemorrhagic infarction involving the left frontal, parietal, and occipital lobes as described. Involvement of the left precentral gyrus corresponds with the right-sided weakness. 2. Additional punctate foci of acute nonhemorrhagic infarct involving the inferior right cerebellum and left posterior hippocampus. 3. Remote punctate infarcts involving the left paramedian brainstem, left cerebellum, and left paramedian brainstem. 4. Extensive white matter disease likely reflects the sequela of chronic microvascular ischemia. 5. Bilateral mastoid effusions. No obstructing nasopharyngeal lesion is present. Electronically Signed   By: San Morelle M.D.   On:  04/11/2019 14:14   US RENAL  Result Date: 04/10/2019 CLINICAL DATA:  Acute renal failure. EXAM: RENAL / URINARY TRACT ULTRASOUND COMPLETE COMPARISON:  None. FINDINGS: Right Kidney: Renal measurements: 7.9 x 4.4 x 4.7 cm = volume: 87 mL . Echogenicity within normal limits. No mass or hydronephrosis visualized. Left Kidney: Renal measurements: 9.8 x 4.3 x 5.1 cm = volume: 113 mL. Echogenicity within normal limits. No mass or hydronephrosis visualized. Bladder: Poorly visualized due to overlying bowel gas. Other: None.  IMPRESSION: No hydronephrosis. Electronically Signed   By: Lovey Newcomer M.D.   On: 04/10/2019 12:05   DG Chest Port 1 View  Result Date: 04/11/2019 CLINICAL DATA:  Chest pain and shortness of breath. History of hypertension, hyperlipidemia, diet-controlled diabetes, stroke, GERD, depression, anxiety, left eye blindness, alcohol abuse in remission, CKD stage III, dCHF. EXAM: PORTABLE CHEST 1 VIEW COMPARISON:  04/07/2019 FINDINGS: Moderate enlargement of the cardiopericardial silhouette. Atherosclerotic calcification of the aortic arch. Reverse lordotic frontal projection. Upper zone pulmonary vascular prominence suspicious for pulmonary venous hypertension. No overt edema. No significant blunting of the lateral costophrenic angles. IMPRESSION: Moderate enlargement of the cardiopericardial silhouette with pulmonary venous hypertension, but no overt edema. Electronically Signed   By: Van Clines M.D.   On: 04/11/2019 10:54    Cardiac Studies   TTE 11/02/2018 1. Left ventricular ejection fraction, by visual estimation, is 60 to  65%. The left ventricle has normal function. Normal left ventricular size.  Left ventricular septal wall thickness was mildly increased. Mildly  increased left ventricular posterior  wall thickness.  2. Left ventricular diastolic Doppler parameters are consistent with  impaired relaxation pattern of LV diastolic filling.  3. Elevated left ventricular  end-diastolic pressure.  4. Global right ventricle has normal systolic function.The right  ventricular size is normal. No increase in right ventricular wall  thickness.  5. Left atrial size was moderately dilated.  6. Right atrial size was normal.  7. Moderate mitral annular calcification.  8. The mitral valve is normal in structure. Trace mitral valve  regurgitation. No evidence of mitral stenosis.  9. The tricuspid valve is normal in structure. Tricuspid valve  regurgitation is mild.  10. The aortic valve is tricuspid. Aortic valve regurgitation is trivial  by color flow Doppler. Moderate aortic valve sclerosis/calcification  without any evidence of aortic stenosis.  11. The pulmonic valve was normal in structure. Pulmonic valve  regurgitation is mild by color flow Doppler.  12. Normal pulmonary artery systolic pressure.  13. The inferior vena cava is normal in size with greater than 50%  respiratory variability, suggesting right atrial pressure of 3 mmHg.   LHC 04/08/2019  Ost RCA to Mid RCA lesion is 100% stenosed.  Prox Cx lesion is 30% stenosed.  Mid LAD lesion is 30% stenosed.  06/2017 Monitor  NSR  PAC's  No atrial fibrillation, tachycardia, or pauses    Patient Profile     86 y.o. female with history of chronic diastolic CHF, stroke, hypertension, hyperlipidemia, stage III CKD, DM2 previously controlled by diet, and presenting with chest pain in the setting of hypertensive urgency and elevated troponin.  Assessment & Plan    Unstable Angina CAD s/p 3/26 LHC with occluded RCA --No current chest pain. Over the last month, she was experiencing exertional CP, rated 10/10 and in the setting of HTN and tachycardia.  HS Tn peaked at 88 on 3/25. [place] 3/26 as above with 100% stenosed ostial to mRCA and no intervention performed. Rate well controlled with BP labile and recommendation as below to consider addition of amlodipine for additional support. Continue medical  management with ASA. BB, statin, Imdur 60mg  BID, PRN SL nitro. Avoid further nephrotoxins due to current renal function as below. Renal function improved today with baseline Cr 1.14.   Hypertension --BP sub-optimal at 165/76 but labile with previous SBP 90s. Continue current Coreg, Imdur, PRN hydralazine. Consider addition of amlodipine for BP support. Would avoid ACE/ARB/Spiro due to current renal function.   SVT --Rates well  controlled. NSR with ectopy, similar to 2019 monitor as above with NSR, PACs, and without any significant arrhythmia.   Chronic diastolic CHF --Denies SOB today. Most recent echo as above. Euvolemic on exam. Continue current medications. As above, avoid ACE/ARB due to renal function.  ? URI ? --Cough and sneeze today. Afebrile. Further workup per IM. Monitor.  HLD --Consider increasing current atorvastatin 40mg  to 80mg  versus addition of Zetia with repeat lipid and liver in 6-8 weeks. Previous LDL 71 with goal LDL below 70.  CKD  --Nephrology following. Atrophic R kidney. AOCKD this admission thought likely 2/2 contrast versus prerenal with hypotension. Renal function slowly improving. Most recent Cr 2.29  12.20 with BUN 35  33. Daily BMET. Avoid further nephrotoxins. Cr. 1.14 on admission.   Anemia of chronic kidney Dz --Nephrology following and holding off on Epogen at this time. Hgb currently 9.1.  For questions or updates, please contact [place] Please consult [email or web address] for contact info under        Signed, Arvil Chaco, PA-C  04/12/2019, 8:37 AM

## 2019-04-12 NOTE — Progress Notes (Signed)
PT Cancellation Note  Patient Details Name: [name] MRN: [id number] DOB: Aug 15, 1932   Cancelled Treatment:     PT attempt. Pt is not in room. Per RN, pt is off floor for ultrasound. Therapist will try to treat pt after she returns.    Willette Pa 04/12/2019, 2:55 PM

## 2019-04-12 NOTE — Progress Notes (Signed)
PROGRESS NOTE    [name]  TIR:[id number] DOB: Jan 23, 1932 DOA: 04/07/2019 PCP: Dierdre Harness, FNP   Brief Narrative:  HPI: [name] is a 86 y.o. female with medical history significant of hypertension, hyperlipidemia, diet-controlled diabetes, stroke, GERD, depression, anxiety, left eye blindness, alcohol abuse in remission, CKD stage III, dCHF, who presents with chest pain.  Patient states that she started having chest pain since yesterday.  She still has some chest pain earlier today, currently chest pain-free.  She states that the chest pain is located in substernal area, pressure-like, moderate, nonradiating.  Associated with mild shortness of breath.  She also has mild dry cough.  No fever or chills.  Patient denies nausea, vomiting, diarrhea, abdominal pain, symptoms of UTI or unilateral weakness.  No recent fall.  No dark stool.  Patient states that she quit drinking alcohol for more than 6 months.  Patient initially had sinus tachycardia with heart rate of 132, which improved to 71 after received 10 mg of Cardizem by IV in ED.  3/26: Patient underwent cardiac catheterization today.  Case discussed with Dr. Rockey Situ.  Catheterization had occluded RCA with collaterals left to right.  Multiple episodes of atrial tachycardia during catheterization.  Lopressor 2.5 mg IV given twice during cath.  Both normal sinus rhythm.  Per cardiology this is likely the underlying factor the patient's symptoms.  3/27: Patient seen and examined.  Status post cardiac catheterization.  Worsening kidney function over interval noted.  Creatinine 1.91 today.  Patient sitting up in bed.  In no visible distress.  Heart rate improved after initiation of beta-blocker per cardiology  3/28: Patient seen and examined.  Worsening kidney function over interval.  Creatinine 2.44 today.  Patient in no visible distress.  Heart rate improved over interval.  Nephrology consult requested.  Recommendations greatly  appreciated.  3/29: Patient seen and examined.  Kidney function slightly improved.  Remains on intravenous fluids.  Patient in no visible distress.  Does endorse some shortness of breath upon exertion.  Discussed with physical therapy.  Patient still exhibiting some minimal right sided upper and lower extremity weakness.  3/30: Patient seen and examined.  Kidney function slightly improved over interval.  MRI brain reviewed.  Showed multiple areas of acute/subacute infarct.  Neurology consult requested.  Recommendations from Dr. Doy Mince greatly appreciated.   Assessment & Plan:   Principal Problem:   Chest pain Active Problems:   Chronic diastolic heart failure (HCC)   Ischemic stroke (HCC)   Alcohol abuse   GERD without esophagitis   AKI (acute kidney injury) ([place])   CKD (chronic kidney disease), stage IV (HCC)   HTN (hypertension)   HLD (hyperlipidemia)   Elevated troponin   Unstable angina (HCC)   Tachycardia   Coronary artery disease involving native coronary artery of native heart without angina pectoris  Acute on Chronic kidney disease stage IIIa Creatinine 1.14-->1.91-->2.44-->2.29-->2.2 Suspect ATN secondary to dye exposure Renal ultrasound, no hydro UA bland Plan: Continue intravenous fluid resuscitation Daily creatinine Follow nephrology recommendation  Right-sided weakness, acute onset Acute/subacute infarct Patient had some right sided upper and lower extremity weakness noted by nursing staff and physical therapy CT head negative MRI brain with multiple areas of nonhemorrhagic subacute/acute infarct Per neurology appearance on MRI points to an embolic origin  plan: Neurology consulted Appreciate recommendations from Dr. Doy Mince Check 2D echo Bilateral carotid Doppler Dual antiplatelet therapy, Plavix 75 mg daily, aspirin 81 mg daily for 3 weeks.  Plan to de-escalate to Plavix monotherapy at  that time Aggressive lipid control, goal LDL less than  70  Unstable angina Status post cardiac catheterization Occluded RCA, collaterals left to right Recommending medical management Per cardiology worsening anginal symptoms likely secondary to atrial tachycardia Plan: Continue beta-blocker, Coreg 25 mg daily Aspirin 81 mg daily High intensity statin  Atrial tachycardia Likely driving her episodic chest pain and unstable angina Beta-blocker advance per cardiology  Hypertensive urgency Coreg 25 mg daily Imdur Hydralazine as needed  History of CVA Continue aspirin and Lipitor  History of alcohol abuse Patient endorses cessation  GERD On Protonix  Difficult IV access Multiple attempts by multiple different providers were unsuccessful Eventually midline was successful Midline in place in left upper extremity   DVT prophylaxis: Heparin Code Status: Full Family Communication: Granddaughter Salvatore Decent [phone] on 04/11/2019 Disposition Plan: Anticipate return to previous home environment.  Post catheterization day #4.  No complaints of recurrent anginal pain.  Blood pressure heart rate remained reasonably well controlled.  Patient's creatinine is starting to improve.  Nephrology consult appreciated.  Physical therapy noted some persistent right-sided weakness.  MRI brain demonstrates multiple areas of acute/subacute infarct.  Neurology consult called.  Further work-up in progress.  Consultants:  Cardiology-[facility]  Procedures:   Left heart catheterization-04/08/2019  Antimicrobials:  none   Subjective: Patient seen and examined Vitals stabilized No complaints this morning   Objective: Vitals:   04/12/19 0335 04/12/19 0338 04/12/19 0814 04/12/19 1137  BP:  98/64 (!) 165/76 (!) 149/66  Pulse:  61 62 (!) 56  Resp:  20 18 18   Temp:  97.7 F (36.5 C) 98.5 F (36.9 C) 98.1 F (36.7 C)  TempSrc:  Oral    SpO2:  98% 97% 100%  Weight: 68.5 kg     Height:         Intake/Output Summary (Last 24 hours) at 04/12/2019 1425 Last data filed at 04/12/2019 1345 Gross per 24 hour  Intake 480 ml  Output 400 ml  Net 80 ml   Filed Weights   04/09/19 0143 04/10/19 0418 04/12/19 0335  Weight: 65 kg 66.4 kg 68.5 kg    Examination:  General exam: Appears calm and comfortable, left eye blindness Respiratory system: Clear to auscultation. Respiratory effort normal. Cardiovascular system: S1 & S2 heard, RRR. No JVD, murmurs, rubs, gallops or clicks. No pedal edema. Gastrointestinal system: Abdomen is nondistended, soft and nontender. No organomegaly or masses felt. Normal bowel sounds heard. Central nervous system: Alert and oriented. No focal neurological deficits. Extremities: Symmetric 5 x 5 power. Skin: No rashes, lesions or ulcers Psychiatry: Judgement and insight appear normal. Mood & affect appropriate.     Data Reviewed: I have personally reviewed following labs and imaging studies  CBC: Recent Labs  Lab 04/07/19 1116 04/10/19 1733 04/11/19 0459 04/12/19 0718  WBC 7.6 6.9 5.3 5.1  HGB 12.1 10.1* 9.5* 9.1*  HCT 37.2 30.0* 29.2* 28.3*  MCV 91.4 92.3 93.0 93.7  PLT 229 176 195 417   Basic Metabolic Panel: Recent Labs  Lab 04/08/19 0437 04/09/19 0457 04/10/19 0810 04/11/19 0459 04/12/19 0718  NA 141 141 137 139 136  K 4.5 4.4 4.3 4.1 4.2  CL 108 109 106 109 105  CO2 20* 23 22 23 22   GLUCOSE 83 71 83 92 94  BUN 22 29* 34* 35* 33*  CREATININE 1.64* 1.91* 2.44* 2.29* 2.20*  CALCIUM 9.1 8.5* 8.7* 8.5* 8.8*  PHOS  --   --   --  4.1  --  GFR: Estimated Creatinine Clearance: 16.1 mL/min (A) (by C-G formula based on SCr of 2.2 mg/dL (H)). Liver Function Tests: Recent Labs  Lab 04/07/19 1116  AST 47*  ALT 27  ALKPHOS 75  BILITOT 0.7  PROT 7.6  ALBUMIN 3.6   No results for input(s): LIPASE, AMYLASE in the last 168 hours. No results for input(s): AMMONIA in the last 168 hours. Coagulation Profile: No results for input(s):  INR, PROTIME in the last 168 hours. Cardiac Enzymes: No results for input(s): CKTOTAL, CKMB, CKMBINDEX, TROPONINI in the last 168 hours. BNP (last 3 results) Recent Labs    11/10/18 1516  PROBNP 924*   HbA1C: No results for input(s): HGBA1C in the last 72 hours. CBG: Recent Labs  Lab 04/08/19 0927 04/08/19 1036 04/08/19 1300 04/09/19 1429  GLUCAP 71 89 80 108*   Lipid Profile: No results for input(s): CHOL, HDL, LDLCALC, TRIG, CHOLHDL, LDLDIRECT in the last 72 hours. Thyroid Function Tests: No results for input(s): TSH, T4TOTAL, FREET4, T3FREE, THYROIDAB in the last 72 hours. Anemia Panel: No results for input(s): VITAMINB12, FOLATE, FERRITIN, TIBC, IRON, RETICCTPCT in the last 72 hours. Sepsis Labs: Recent Labs  Lab 04/12/19 0718  PROCALCITON <0.10    Recent Results (from the past 240 hour(s))  Respiratory Panel by RT PCR (Flu A&B, Covid) - Nasopharyngeal Swab     Status: None   Collection Time: 04/07/19 12:54 PM   Specimen: Nasopharyngeal Swab  Result Value Ref Range Status   SARS Coronavirus 2 by RT PCR NEGATIVE NEGATIVE Final    Comment: (NOTE) SARS-CoV-2 target nucleic acids are NOT DETECTED. The SARS-CoV-2 RNA is generally detectable in upper respiratoy specimens during the acute phase of infection. The lowest concentration of SARS-CoV-2 viral copies this assay can detect is 131 copies/mL. A negative result does not preclude SARS-Cov-2 infection and should not be used as the sole basis for treatment or other patient management decisions. A negative result may occur with  improper specimen collection/handling, submission of specimen other than nasopharyngeal swab, presence of viral mutation(s) within the areas targeted by this assay, and inadequate number of viral copies (<131 copies/mL). A negative result must be combined with clinical observations, patient history, and epidemiological information. The expected result is Negative. Fact Sheet for Patients:   PinkCheek.be Fact Sheet for Healthcare Providers:  GravelBags.it This test is not yet ap proved or cleared by the [place] FDA and  has been authorized for detection and/or diagnosis of SARS-CoV-2 by FDA under an Emergency Use Authorization (EUA). This EUA will remain  in effect (meaning this test can be used) for the duration of the COVID-19 declaration under Section 564(b)(1) of the Act, 21 U.S.C. section 360bbb-3(b)(1), unless the authorization is terminated or revoked sooner.    Influenza A by PCR NEGATIVE NEGATIVE Final   Influenza B by PCR NEGATIVE NEGATIVE Final    Comment: (NOTE) The Xpert Xpress SARS-CoV-2/FLU/RSV assay is intended as an aid in  the diagnosis of influenza from Nasopharyngeal swab specimens and  should not be used as a sole basis for treatment. Nasal washings and  aspirates are unacceptable for Xpert Xpress SARS-CoV-2/FLU/RSV  testing. Fact Sheet for Patients: PinkCheek.be Fact Sheet for Healthcare Providers: GravelBags.it This test is not yet approved or cleared by the [place] FDA and  has been authorized for detection and/or diagnosis of SARS-CoV-2 by  FDA under an Emergency Use Authorization (EUA). This EUA will remain  in effect (meaning this test can be used) for the duration of the  Covid-19 declaration under Section 564(b)(1) of the Act, 21  U.S.C. section 360bbb-3(b)(1), unless the authorization is  terminated or revoked. Performed at [facility], [address] [place], [place] [address]          Radiology Studies: MR BRAIN WO CONTRAST  Result Date: 04/11/2019 CLINICAL DATA:  Focal neuro deficit, greater than 6 hours. Right-sided weakness. EXAM: MRI HEAD WITHOUT CONTRAST TECHNIQUE: Multiplanar, multiecho pulse sequences of the brain and surrounding structures were obtained without intravenous contrast.  COMPARISON:  CT head without contrast 04/09/2019 FINDINGS: Brain: Multifocal areas of acute/subacute nonhemorrhagic infarction are present bilaterally. The largest cortical area involves the precentral and postcentral gyrus on the left, corresponding with right-sided weakness. A radial white matter infarct is present subjacent to the left middle frontal gyrus. Additional small cortical foci are present in the parietal lobe, the left occipital lobe of the right parietoccipital lobe, and the left posterior hippocampus. A 5 mm focus is present at the inferior right cerebellum. A more posterior remote right cerebellar infarct is present. Remote punctate foci are present in the left cerebellum and left paramedian brainstem. Confluent periventricular white matter changes extend into the internal capsule bilaterally. No hemorrhage or mass lesion is present. Vascular: Flow is present in the major intracranial arteries. Skull and upper cervical spine: The craniocervical junction is normal. Upper cervical spine is within normal limits. Marrow signal is unremarkable. Extensive ossification the inter cerebral falx is again noted. Sinuses/Orbits: Bilateral mastoid effusions are present. No obstructing nasopharyngeal lesion is present. The paranasal sinuses and mastoid air cells are otherwise clear. The left globe has been replaced. Right globe bilateral orbits are otherwise normal. : 1. Multifocal areas of acute/subacute nonhemorrhagic infarction involving the left frontal, parietal, and occipital lobes as described. Involvement of the left precentral gyrus corresponds with the right-sided weakness. 2. Additional punctate foci of acute nonhemorrhagic infarct involving the inferior right cerebellum and left posterior hippocampus. 3. Remote punctate infarcts involving the left paramedian brainstem, left cerebellum, and left paramedian brainstem. 4. Extensive white matter disease likely reflects the sequela of chronic microvascular  ischemia. 5. Bilateral mastoid effusions. No obstructing nasopharyngeal lesion is present. Electronically Signed   By: San Morelle M.D.   On: 04/11/2019 14:14   DG Chest Port 1 View  Result Date: 04/11/2019 CLINICAL DATA:  Chest pain and shortness of breath. History of hypertension, hyperlipidemia, diet-controlled diabetes, stroke, GERD, depression, anxiety, left eye blindness, alcohol abuse in remission, CKD stage III, dCHF. EXAM: PORTABLE CHEST 1 VIEW COMPARISON:  04/07/2019 FINDINGS: Moderate enlargement of the cardiopericardial silhouette. Atherosclerotic calcification of the aortic arch. Reverse lordotic frontal projection. Upper zone pulmonary vascular prominence suspicious for pulmonary venous hypertension. No overt edema. No significant blunting of the lateral costophrenic angles. IMPRESSION: Moderate enlargement of the cardiopericardial silhouette with pulmonary venous hypertension, but no overt edema. Electronically Signed   By: Van Clines M.D.   On: 04/11/2019 10:54        Scheduled Meds: . amLODipine  2.5 mg Oral Daily  . aspirin EC  81 mg Oral Daily  . atorvastatin  40 mg Oral q1800  . brimonidine  1 drop Right Eye BID  . carvedilol  25 mg Oral BID WC  . clopidogrel  75 mg Oral Daily  . folic acid  1 mg Oral Daily  . gabapentin  300 mg Oral TID  . heparin  5,000 Units Subcutaneous Q8H  . isosorbide mononitrate  60 mg Oral BID  . latanoprost  1 drop Right Eye  QHS  . multivitamin with minerals  1 tablet Oral Daily  . pantoprazole  40 mg Oral Daily  . sodium chloride flush  10-40 mL Intracatheter Q12H  . thiamine  100 mg Oral Daily   Or  . thiamine  100 mg Intravenous Daily   Continuous Infusions: . sodium chloride    . cefTRIAXone (ROCEPHIN)  IV 1 g (04/12/19 1327)  . metronidazole 500 mg (04/12/19 1212)     LOS: 4 days    Time spent: 35 minutes    Sidney Ace, MD [facility] Pager [phone]  If 7PM-7AM, please contact  night-coverage 04/12/2019, 2:25 PM

## 2019-04-12 NOTE — Consult Note (Signed)
Requesting Physician: Priscella Mann    Chief Complaint: Right sided weakness  I have been asked by Dr. Steffanie Dunn to see this patient in consultation for stroke.  HPI: Nimrit Kehres is an 86 y.o. female with medical history significant of hypertension, hyperlipidemia, diet-controlled diabetes, stroke, GERD, depression, anxiety, left eye blindness, alcohol abuse in remission, CKD stage III, dCHF, who presented on 3/25 with chest pain.  Patient reports that otherwise she was at baseline at that time. Since hospitalization has reported right sided weakness.  Unclear LKW.  Underwent heart cath on 3/26.   Consult called for further recommendations.    Date last known well: 04/07/2019 Time last known well: Unable to determine tPA Given: No: Unable to determine LKW  Past Medical History:  Diagnosis Date  . Anemia 04/03/2011  . Anginal pain ([place])    a. 02/2014 MV: Low risk; b. 05/2017 MV: small area of inferior basal ischemia. EF 54% - low risk-->Med mgmt.  Marland Kitchen Anxiety   . Aortic valve sclerosis w/ systolic murmur    a. 94/7654 AoV sclerosis w/o stenosis.  . Arthritis    "feet" (06/02/2017)  . Blind left eye 1999  . CKD (chronic kidney disease) stage 3, GFR 30-59 ml/min   . Depression   . Diastolic dysfunction    a. 10/2018 Echo: EF 60-65%, mild septal/posterior LVH. Impaired relaxation. Nl RV size/fxn. Mod dil LA. Trace TR. Triv AI. Mod AoV sclerosis w/o stenosis. Nl PASP.  Marland Kitchen Exertional dyspnea   . GERD (gastroesophageal reflux disease)   . High cholesterol   . Hip fracture ([place]) 07/17/11   fall from 1-2 feet; left  . Hypertension   . Ischemic stroke ([employer] [employer]) 05/2017   a. 06/2017 Holter: RSR, PACs, no afib.  . Prosthetic eye globe 1999   "from diabetic; left eye"  . Type II diabetes mellitus ([place])    not taking any medication (06/02/2017)    Past Surgical History:  Procedure Laterality Date  . ENUCLEATION Left   . FRACTURE SURGERY    . HIP ARTHROPLASTY  07/18/2011   Procedure: ARTHROPLASTY  BIPOLAR HIP;  Surgeon: Nita Sells, MD;  Location: [facility];  Service: Orthopedics;  Laterality: Left;  . INTRAOCULAR PROSTHESES INSERTION  1999   left  . LEFT HEART CATH AND CORS/GRAFTS ANGIOGRAPHY N/A 04/08/2019   Procedure: LEFT HEART CATH AND CORS/GRAFTS ANGIOGRAPHY;  Surgeon: Minna Merritts, MD;  Location: [facility];  Service: Cardiovascular;  Laterality: N/A;  . [place]   "cancer"  . TUBAL LIGATION  1970's    Family History  Problem Relation Age of Onset  . Diabetes type II Mother   . Diabetes type II Father   . Diabetes type II Sister   . Colon cancer Neg Hx    Social History:  reports that she quit smoking about 21 years ago. Her smoking use included cigarettes. She has a 70.50 pack-year smoking history. She has never used smokeless tobacco. She reports previous alcohol use of about 17.0 standard drinks of alcohol per week. She reports that she does not use drugs.  Allergies: No Known Allergies  Medications:  I have reviewed the patient's current medications. Prior to Admission:  Medications Prior to Admission  Medication Sig Dispense Refill Last Dose  . acetaminophen (TYLENOL) 325 MG tablet Take 2 tablets (650 mg total) by mouth every 6 (six) hours as needed for mild pain or fever (or temp > 37.5 C (99.5 F)).   Unknown at PRN  . ALPHAGAN  P 0.1 % SOLN Place 1 drop into the right eye 2 (two) times daily.   04/06/2019 at Unknown time  . aspirin 81 MG tablet Take 81 mg by mouth daily.   04/08/2019 at Unknown time  . atorvastatin (LIPITOR) 40 MG tablet Take 1 tablet (40 mg total) by mouth daily at 6 PM. 90 tablet 3 04/06/2019 at Unknown time  . carvedilol (COREG) 12.5 MG tablet Take 12.5 mg by mouth 2 (two) times daily with a meal.   04/06/2019 at Unknown time  . folic acid (FOLVITE) 1 MG tablet Take 1 tablet (1 mg total) by mouth daily.   Past Month at Unknown time  . gabapentin (NEURONTIN) 300 MG capsule Take 300 mg by mouth 3 (three) times daily.     04/06/2019 at Unknown time  . hydrALAZINE (APRESOLINE) 10 MG tablet Take 2 tablets (20 mg total) by mouth 3 (three) times daily. (Patient taking differently: Take 10 mg by mouth 3 (three) times daily. ) 180 tablet 2 04/06/2019 at Unknown time  . isosorbide mononitrate (IMDUR) 60 MG 24 hr tablet Take 2 tablets by mouth in the a.m and take 1 tablet by mouth at bedtime (Patient taking differently: Take 60 mg by mouth 2 (two) times daily. ) 90 tablet 3 04/06/2019 at Unknown time  . Multiple Vitamin (MULTIVITAMIN WITH MINERALS) TABS tablet Take 1 tablet by mouth daily.     . nitroGLYCERIN (NITROSTAT) 0.4 MG SL tablet Place 1 tablet (0.4 mg total) under the tongue every 5 (five) minutes as needed for chest pain. 25 tablet 3 Past Week at PRN  . omeprazole (PRILOSEC) 20 MG capsule Take 1 capsule (20 mg total) by mouth daily. 30 capsule 9 04/06/2019 at Unknown time  . TRAVATAN Z 0.004 % SOLN ophthalmic solution Place 1 drop into the right eye at bedtime.    04/06/2019 at Unknown time   Scheduled: . amLODipine  2.5 mg Oral Daily  . aspirin EC  81 mg Oral Daily  . atorvastatin  40 mg Oral q1800  . brimonidine  1 drop Right Eye BID  . carvedilol  25 mg Oral BID WC  . folic acid  1 mg Oral Daily  . gabapentin  300 mg Oral TID  . heparin  5,000 Units Subcutaneous Q8H  . isosorbide mononitrate  60 mg Oral BID  . latanoprost  1 drop Right Eye QHS  . multivitamin with minerals  1 tablet Oral Daily  . pantoprazole  40 mg Oral Daily  . sodium chloride flush  10-40 mL Intracatheter Q12H  . thiamine  100 mg Oral Daily   Or  . thiamine  100 mg Intravenous Daily    ROS: History obtained from the patient  General ROS: negative for - chills, fatigue, fever, night sweats, weight gain or weight loss Psychological ROS: negative for - behavioral disorder, hallucinations, memory difficulties, mood swings or suicidal ideation Ophthalmic ROS: left eye blindness ENT ROS: negative for - epistaxis, nasal discharge, oral  lesions, sore throat, tinnitus or vertigo Allergy and Immunology ROS: negative for - hives or itchy/watery eyes Hematological and Lymphatic ROS: negative for - bleeding problems, bruising or swollen lymph nodes Endocrine ROS: negative for - galactorrhea, hair pattern changes, polydipsia/polyuria or temperature intolerance Respiratory ROS: shortness of breath Cardiovascular ROS: negative for - chest pain, dyspnea on exertion, edema or irregular heartbeat Gastrointestinal ROS: negative for - abdominal pain, diarrhea, hematemesis, nausea/vomiting or stool incontinence Genito-Urinary ROS: negative for - dysuria, hematuria, incontinence or urinary frequency/urgency Musculoskeletal  ROS: negative for - joint swelling or muscular weakness Neurological ROS: as noted in HPI Dermatological ROS: negative for rash and skin lesion changes  Physical Examination: Blood pressure (!) 165/76, pulse 62, temperature 98.5 F (36.9 C), resp. rate 18, height 5' (1.524 m), weight 68.5 kg, SpO2 97 %.  HEENT-  Normocephalic, no lesions, without obvious abnormality.  Normal external eye and conjunctiva.  Normal TM's bilaterally.  Normal auditory canals and external ears. Normal external nose, mucus membranes and septum.  Normal pharynx. Cardiovascular- S1, S2 normal, pulses palpable throughout   Lungs- chest clear, no wheezing, rales, normal symmetric air entry Abdomen- soft, non-tender; bowel sounds normal; no masses,  no organomegaly Extremities- no edema Lymph-no adenopathy palpable Musculoskeletal-no joint tenderness, deformity or swelling Skin-warm and dry, no hyperpigmentation, vitiligo, or suspicious lesions  Neurological Examination   Mental Status: Alert, oriented, thought content appropriate.  Speech fluent without evidence of aphasia.  Able to follow 3 step commands without difficulty. Cranial Nerves: II: Visual fields grossly normal in the right eye, blind in the left eye III,IV, VI: ptosis not  present, exotropia with left eye.  EOMI with right eye V,VII: smile symmetric, facial light touch sensation decreased on the right VIII: hearing normal bilaterally IX,X: gag reflex present XI: bilateral shoulder shrug XII: midline tongue extension Motor: Right : Upper extremity   3/5 more pronounced proximally  Left:     Upper extremity   5/5  Lower extremity   4-/5       Lower extremity   5/5 Tone and bulk:normal tone throughout; no atrophy noted Sensory: Pinprick and light touch decreased on the right upper and lower extremities Deep Tendon Reflexes: Symmetric throughout Plantars: Right: mure   Left: mute Cerebellar: Normal finger-to-nose testing bilaterally Gait: not tested due to safety concerns    Laboratory Studies:  Basic Metabolic Panel: Recent Labs  Lab 04/08/19 0437 04/08/19 0437 04/09/19 0457 04/09/19 0457 04/10/19 0810 04/11/19 0459 04/12/19 0718  NA 141  --  141  --  137 139 136  K 4.5  --  4.4  --  4.3 4.1 4.2  CL 108  --  109  --  106 109 105  CO2 20*  --  23  --  22 23 22   GLUCOSE 83  --  71  --  83 92 94  BUN 22  --  29*  --  34* 35* 33*  CREATININE 1.64*  --  1.91*  --  2.44* 2.29* 2.20*  CALCIUM 9.1   < > 8.5*   < > 8.7* 8.5* 8.8*  PHOS  --   --   --   --   --  4.1  --    < > = values in this interval not displayed.    Liver Function Tests: Recent Labs  Lab 04/07/19 1116  AST 47*  ALT 27  ALKPHOS 75  BILITOT 0.7  PROT 7.6  ALBUMIN 3.6   No results for input(s): LIPASE, AMYLASE in the last 168 hours. No results for input(s): AMMONIA in the last 168 hours.  CBC: Recent Labs  Lab 04/07/19 1116 04/10/19 1733 04/11/19 0459 04/12/19 0718  WBC 7.6 6.9 5.3 5.1  HGB 12.1 10.1* 9.5* 9.1*  HCT 37.2 30.0* 29.2* 28.3*  MCV 91.4 92.3 93.0 93.7  PLT 229 176 195 180    Cardiac Enzymes: No results for input(s): CKTOTAL, CKMB, CKMBINDEX, TROPONINI in the last 168 hours.  BNP: Invalid input(s): POCBNP  CBG: Recent Labs  Lab 04/08/19  1062  04/08/19 1036 04/08/19 1300 04/09/19 1429  GLUCAP 71 89 80 108*    Microbiology: Results for orders placed or performed during the hospital encounter of 04/07/19  Respiratory Panel by RT PCR (Flu A&B, Covid) - Nasopharyngeal Swab     Status: None   Collection Time: 04/07/19 12:54 PM   Specimen: Nasopharyngeal Swab  Result Value Ref Range Status   SARS Coronavirus 2 by RT PCR NEGATIVE NEGATIVE Final    Comment: (NOTE) SARS-CoV-2 target nucleic acids are NOT DETECTED. The SARS-CoV-2 RNA is generally detectable in upper respiratoy specimens during the acute phase of infection. The lowest concentration of SARS-CoV-2 viral copies this assay can detect is 131 copies/mL. A negative result does not preclude SARS-Cov-2 infection and should not be used as the sole basis for treatment or other patient management decisions. A negative result may occur with  improper specimen collection/handling, submission of specimen other than nasopharyngeal swab, presence of viral mutation(s) within the areas targeted by this assay, and inadequate number of viral copies (<131 copies/mL). A negative result must be combined with clinical observations, patient history, and epidemiological information. The expected result is Negative. Fact Sheet for Patients:  PinkCheek.be Fact Sheet for Healthcare Providers:  GravelBags.it This test is not yet ap proved or cleared by the [place] FDA and  has been authorized for detection and/or diagnosis of SARS-CoV-2 by FDA under an Emergency Use Authorization (EUA). This EUA will remain  in effect (meaning this test can be used) for the duration of the COVID-19 declaration under Section 564(b)(1) of the Act, 21 U.S.C. section 360bbb-3(b)(1), unless the authorization is terminated or revoked sooner.    Influenza A by PCR NEGATIVE NEGATIVE Final   Influenza B by PCR NEGATIVE NEGATIVE Final    Comment:  (NOTE) The Xpert Xpress SARS-CoV-2/FLU/RSV assay is intended as an aid in  the diagnosis of influenza from Nasopharyngeal swab specimens and  should not be used as a sole basis for treatment. Nasal washings and  aspirates are unacceptable for Xpert Xpress SARS-CoV-2/FLU/RSV  testing. Fact Sheet for Patients: PinkCheek.be Fact Sheet for Healthcare Providers: GravelBags.it This test is not yet approved or cleared by the [place] FDA and  has been authorized for detection and/or diagnosis of SARS-CoV-2 by  FDA under an Emergency Use Authorization (EUA). This EUA will remain  in effect (meaning this test can be used) for the duration of the  Covid-19 declaration under Section 564(b)(1) of the Act, 21  U.S.C. section 360bbb-3(b)(1), unless the authorization is  terminated or revoked. Performed at [facility], [address] [place], [place] [address]     Coagulation Studies: No results for input(s): LABPROT, INR in the last 72 hours.  Urinalysis:  Recent Labs  Lab 04/10/19 2244  COLORURINE YELLOW*  LABSPEC 1.013  PHURINE 5.0  GLUCOSEU NEGATIVE  HGBUR NEGATIVE  BILIRUBINUR NEGATIVE  KETONESUR NEGATIVE  PROTEINUR 30*  NITRITE POSITIVE*  LEUKOCYTESUR LARGE*    Lipid Panel:    Component Value Date/Time   CHOL 169 04/08/2019 0437   TRIG 154 (H) 04/08/2019 0437   HDL 47 04/08/2019 0437   CHOLHDL 3.6 04/08/2019 0437   VLDL 31 04/08/2019 0437   LDLCALC 91 04/08/2019 0437    HgbA1C:  Lab Results  Component Value Date   HGBA1C 6.2 (H) 04/08/2019    Urine Drug Screen:      Component Value Date/Time   LABOPIA POSITIVE (A) 04/07/2019 1331   LABOPIA NONE DETECTED 06/02/2017 2110   COCAINSCRNUR  NONE DETECTED 04/07/2019 1331   LABBENZ NONE DETECTED 04/07/2019 1331   LABBENZ NONE DETECTED 06/02/2017 2110   AMPHETMU NONE DETECTED 04/07/2019 1331   AMPHETMU NONE DETECTED 06/02/2017 2110   THCU NONE  DETECTED 04/07/2019 1331   THCU NONE DETECTED 06/02/2017 2110   LABBARB NONE DETECTED 04/07/2019 1331   LABBARB NONE DETECTED 06/02/2017 2110    Alcohol Level: No results for input(s): ETH in the last 168 hours.  Other results: EKG: normal sinus rhythm at 85 bpm.  Imaging: MR BRAIN WO CONTRAST  Result Date: 04/11/2019 CLINICAL DATA:  Focal neuro deficit, greater than 6 hours. Right-sided weakness. EXAM: MRI HEAD WITHOUT CONTRAST TECHNIQUE: Multiplanar, multiecho pulse sequences of the brain and surrounding structures were obtained without intravenous contrast. COMPARISON:  CT head without contrast 04/09/2019 FINDINGS: Brain: Multifocal areas of acute/subacute nonhemorrhagic infarction are present bilaterally. The largest cortical area involves the precentral and postcentral gyrus on the left, corresponding with right-sided weakness. A radial white matter infarct is present subjacent to the left middle frontal gyrus. Additional small cortical foci are present in the parietal lobe, the left occipital lobe of the right parietoccipital lobe, and the left posterior hippocampus. A 5 mm focus is present at the inferior right cerebellum. A more posterior remote right cerebellar infarct is present. Remote punctate foci are present in the left cerebellum and left paramedian brainstem. Confluent periventricular white matter changes extend into the internal capsule bilaterally. No hemorrhage or mass lesion is present. Vascular: Flow is present in the major intracranial arteries. Skull and upper cervical spine: The craniocervical junction is normal. Upper cervical spine is within normal limits. Marrow signal is unremarkable. Extensive ossification the inter cerebral falx is again noted. Sinuses/Orbits: Bilateral mastoid effusions are present. No obstructing nasopharyngeal lesion is present. The paranasal sinuses and mastoid air cells are otherwise clear. The left globe has been replaced. Right globe bilateral orbits  are otherwise normal. : 1. Multifocal areas of acute/subacute nonhemorrhagic infarction involving the left frontal, parietal, and occipital lobes as described. Involvement of the left precentral gyrus corresponds with the right-sided weakness. 2. Additional punctate foci of acute nonhemorrhagic infarct involving the inferior right cerebellum and left posterior hippocampus. 3. Remote punctate infarcts involving the left paramedian brainstem, left cerebellum, and left paramedian brainstem. 4. Extensive white matter disease likely reflects the sequela of chronic microvascular ischemia. 5. Bilateral mastoid effusions. No obstructing nasopharyngeal lesion is present. Electronically Signed   By: San Morelle M.D.   On: 04/11/2019 14:14   US RENAL  Result Date: 04/10/2019 CLINICAL DATA:  Acute renal failure. EXAM: RENAL / URINARY TRACT ULTRASOUND COMPLETE COMPARISON:  None. FINDINGS: Right Kidney: Renal measurements: 7.9 x 4.4 x 4.7 cm = volume: 87 mL . Echogenicity within normal limits. No mass or hydronephrosis visualized. Left Kidney: Renal measurements: 9.8 x 4.3 x 5.1 cm = volume: 113 mL. Echogenicity within normal limits. No mass or hydronephrosis visualized. Bladder: Poorly visualized due to overlying bowel gas. Other: None. IMPRESSION: No hydronephrosis. Electronically Signed   By: Lovey Newcomer M.D.   On: 04/10/2019 12:05   DG Chest Port 1 View  Result Date: 04/11/2019 CLINICAL DATA:  Chest pain and shortness of breath. History of hypertension, hyperlipidemia, diet-controlled diabetes, stroke, GERD, depression, anxiety, left eye blindness, alcohol abuse in remission, CKD stage III, dCHF. EXAM: PORTABLE CHEST 1 VIEW COMPARISON:  04/07/2019 FINDINGS: Moderate enlargement of the cardiopericardial silhouette. Atherosclerotic calcification of the aortic arch. Reverse lordotic frontal projection. Upper zone pulmonary vascular prominence suspicious for pulmonary venous  hypertension. No overt edema. No  significant blunting of the lateral costophrenic angles. IMPRESSION: Moderate enlargement of the cardiopericardial silhouette with pulmonary venous hypertension, but no overt edema. Electronically Signed   By: Van Clines M.D.   On: 04/11/2019 10:54    Assessment: 86 y.o. female with medical history significant of hypertension, hyperlipidemia, diet-controlled diabetes, stroke, GERD, depression, anxiety, left eye blindness, alcohol abuse in remission, CKD stage III, dCHF, who presented on 3/25 with chest pain.  Underwent heart cath on 3/26.  Currently with complaint of a right hemiparesis. MRI of the brain personally reviewed and shows multiple small areas of acute/subacute infarction, left greater than right, but in multiple vascular territories.  Etiology likely embolic and related to procedure which appeared to be technically challenging.  Patient also with multiple vascular risk factors.  Has been noted to have atrial tachycardia on this admission as well.  On ASA and statin prior to admission.    A1c 6.2, LDL 91.   Stroke Risk Factors - diabetes mellitus, hyperlipidemia and hypertension  Plan: 1. PT consult, OT consult, Speech consult 2. Echocardiogram 3. Carotid dopplers 4. Prophylactic therapy-Dual antiplatelet therapy with ASA 81mg  and Plavix 75mg  for three weeks with change to Plavix 75mg  daily alone as monotherapy after that time. 5. Telemetry monitoring 6. Frequent neuro checks 7. Aggressive lipid management with target LDL<70.  Alexis Goodell, MD Neurology [phone] 04/12/2019, 10:42 AM

## 2019-04-12 NOTE — Progress Notes (Signed)
Physical Therapy Treatment Patient Details Name: [name] MRN: [id number] DOB: 1932-03-22 Today's Date: 04/12/2019    History of Present Illness presented to ER secondary to substernal chest pain, SOB; admitted for management of chest pain with elevated troponin.  s/p cardiac cath 3/26 significant for occluded RCA with collaterals L to R, recommended for medical management.    PT Comments    Pt was seated in recliner upon arriving. She is very pleasant and cooperative throughout. Pt stood prior to having RW close and does demonstrate unsteadiness without BUE support. No LOB noted with use of RW. Pt was able to ambulate one lap ~ 160 ft with RW with HR between 68-82 bpm. Once returned to room, pt was issued there ex to perform throughout the day when seated. She was able to correctly perform and states she will continue to perform as able. Acute PT will continue to follow pt per POC and progress as able per pt tolerance. Recommend home with supervision + HHPT.      Follow Up Recommendations  Home health PT;Supervision/Assistance - 24 hour     Equipment Recommendations  Rolling walker with 5" wheels;3in1 (PT)    Recommendations for Other Services       Precautions / Restrictions Precautions Precautions: Fall Restrictions Weight Bearing Restrictions: No    Mobility  Bed Mobility               General bed mobility comments: Pt in recliner/pre/post session  Transfers Overall transfer level: Needs assistance Equipment used: Rolling walker (2 wheeled) Transfers: Sit to/from Stand Sit to Stand: Min guard         General transfer comment: Pt slightly impulsive and stands prior to RW being close to her. Pt is unsteady without BUE support.    Ambulation/Gait Ambulation/Gait assistance: Min guard Gait Distance (Feet): 160 Feet Assistive device: Rolling walker (2 wheeled) Gait Pattern/deviations: WFL(Within Functional Limits) Gait velocity: decreased   General  Gait Details: pt demonstrated safe ability to ambulate one lap around RN unit with RW + CGA. she demonstartes safe ability with BUE support. Will attempt SPC next session.   Stairs             Wheelchair Mobility    Modified Rankin (Stroke Patients Only)       Balance                                            Cognition Arousal/Alertness: Awake/alert Behavior During Therapy: WFL for tasks assessed/performed Overall Cognitive Status: Within Functional Limits for tasks assessed                                 General Comments: Pt was A and O throughout session. Cooperative and pleasant throughout. Pt was able to follow commands consistently however does have deficits with safety awareness      Exercises Other Exercises Other Exercises: Therapist isued several sitting ther ex to pt. she was able to correctly perform and states she will continue to perform I'ly.    General Comments        Pertinent Vitals/Pain Pain Assessment: No/denies pain    Home Living                      Prior Function  PT Goals (current goals can now be found in the care plan section) Acute Rehab PT Goals Patient Stated Goal: " I want to get better to go home" Progress towards PT goals: Progressing toward goals    Frequency    Min 2X/week      PT Plan Current plan remains appropriate    Co-evaluation              AM-PAC PT "6 Clicks" Mobility   Outcome Measure  Help needed turning from your back to your side while in a flat bed without using bedrails?: None Help needed moving from lying on your back to sitting on the side of a flat bed without using bedrails?: A Little Help needed moving to and from a bed to a chair (including a wheelchair)?: A Little Help needed standing up from a chair using your arms (e.g., wheelchair or bedside chair)?: A Little Help needed to walk in hospital room?: A Little Help needed climbing  3-5 steps with a railing? : A Little 6 Click Score: 19    End of Session Equipment Utilized During Treatment: Gait belt Activity Tolerance: Patient tolerated treatment well Patient left: in chair;with chair alarm set;with call bell/phone within reach Nurse Communication: Mobility status PT Visit Diagnosis: Muscle weakness (generalized) (M62.81);Difficulty in walking, not elsewhere classified (R26.2)     Time: 5883-2549 PT Time Calculation (min) (ACUTE ONLY): 13 min  Charges:  $Therapeutic Activity: 8-22 mins                     Julaine Fusi PTA 04/12/19, 4:33 PM

## 2019-04-12 NOTE — Progress Notes (Signed)
[facility]  ROUNDING NOTE   Subjective:  Patient sitting up in chair today. Renal function slightly better with a creatinine of 2.20. Denying chest pain at the moment.  Objective:  Vital signs in last 24 hours:  Temp:  [97.7 F (36.5 C)-98.5 F (36.9 C)] 98.1 F (36.7 C) (03/30 1137) Pulse Rate:  [56-71] 56 (03/30 1137) Resp:  [18-20] 18 (03/30 1137) BP: (98-170)/(64-76) 149/66 (03/30 1137) SpO2:  [97 %-100 %] 100 % (03/30 1137) Weight:  [68.5 kg] 68.5 kg (03/30 0335)  Weight change:  Filed Weights   04/09/19 0143 04/10/19 0418 04/12/19 0335  Weight: 65 kg 66.4 kg 68.5 kg    Intake/Output: I/O last 3 completed shifts: In: -  Out: 600 [Urine:600]   Intake/Output this shift:  Total I/O In: 480 [P.O.:480] Out: 200 [Urine:200]  Physical Exam: General: No acute distress  Head: Normocephalic, atraumatic. Moist oral mucosal membranes  Eyes: Anicteric  Neck: Supple, trachea midline  Lungs:  Clear to auscultation, normal effort  Heart: S1S2 no rubs  Abdomen:  Soft, nontender, bowel sounds present  Extremities: No peripheral edema.  Neurologic: Awake, alert, following commands  Skin: No lesions       Basic Metabolic Panel: Recent Labs  Lab 04/08/19 0437 04/08/19 0437 04/09/19 0457 04/09/19 0457 04/10/19 0810 04/11/19 0459 04/12/19 0718  NA 141  --  141  --  137 139 136  K 4.5  --  4.4  --  4.3 4.1 4.2  CL 108  --  109  --  106 109 105  CO2 20*  --  23  --  22 23 22   GLUCOSE 83  --  71  --  83 92 94  BUN 22  --  29*  --  34* 35* 33*  CREATININE 1.64*  --  1.91*  --  2.44* 2.29* 2.20*  CALCIUM 9.1   < > 8.5*   < > 8.7* 8.5* 8.8*  PHOS  --   --   --   --   --  4.1  --    < > = values in this interval not displayed.    Liver Function Tests: Recent Labs  Lab 04/07/19 1116  AST 47*  ALT 27  ALKPHOS 75  BILITOT 0.7  PROT 7.6  ALBUMIN 3.6   No results for input(s): LIPASE, AMYLASE in the last 168 hours. No results for input(s): AMMONIA  in the last 168 hours.  CBC: Recent Labs  Lab 04/07/19 1116 04/10/19 1733 04/11/19 0459 04/12/19 0718  WBC 7.6 6.9 5.3 5.1  HGB 12.1 10.1* 9.5* 9.1*  HCT 37.2 30.0* 29.2* 28.3*  MCV 91.4 92.3 93.0 93.7  PLT 229 176 195 180    Cardiac Enzymes: No results for input(s): CKTOTAL, CKMB, CKMBINDEX, TROPONINI in the last 168 hours.  BNP: Invalid input(s): POCBNP  CBG: Recent Labs  Lab 04/08/19 0927 04/08/19 1036 04/08/19 1300 04/09/19 1429  GLUCAP 71 89 80 108*    Microbiology: Results for orders placed or performed during the hospital encounter of 04/07/19  Respiratory Panel by RT PCR (Flu A&B, Covid) - Nasopharyngeal Swab     Status: None   Collection Time: 04/07/19 12:54 PM   Specimen: Nasopharyngeal Swab  Result Value Ref Range Status   SARS Coronavirus 2 by RT PCR NEGATIVE NEGATIVE Final    Comment: (NOTE) SARS-CoV-2 target nucleic acids are NOT DETECTED. The SARS-CoV-2 RNA is generally detectable in upper respiratoy specimens during the acute phase of infection. The lowest  concentration of SARS-CoV-2 viral copies this assay can detect is 131 copies/mL. A negative result does not preclude SARS-Cov-2 infection and should not be used as the sole basis for treatment or other patient management decisions. A negative result may occur with  improper specimen collection/handling, submission of specimen other than nasopharyngeal swab, presence of viral mutation(s) within the areas targeted by this assay, and inadequate number of viral copies (<131 copies/mL). A negative result must be combined with clinical observations, patient history, and epidemiological information. The expected result is Negative. Fact Sheet for Patients:  PinkCheek.be Fact Sheet for Healthcare Providers:  GravelBags.it This test is not yet ap proved or cleared by the [place] FDA and  has been authorized for detection and/or diagnosis  of SARS-CoV-2 by FDA under an Emergency Use Authorization (EUA). This EUA will remain  in effect (meaning this test can be used) for the duration of the COVID-19 declaration under Section 564(b)(1) of the Act, 21 U.S.C. section 360bbb-3(b)(1), unless the authorization is terminated or revoked sooner.    Influenza A by PCR NEGATIVE NEGATIVE Final   Influenza B by PCR NEGATIVE NEGATIVE Final    Comment: (NOTE) The Xpert Xpress SARS-CoV-2/FLU/RSV assay is intended as an aid in  the diagnosis of influenza from Nasopharyngeal swab specimens and  should not be used as a sole basis for treatment. Nasal washings and  aspirates are unacceptable for Xpert Xpress SARS-CoV-2/FLU/RSV  testing. Fact Sheet for Patients: PinkCheek.be Fact Sheet for Healthcare Providers: GravelBags.it This test is not yet approved or cleared by the [place] FDA and  has been authorized for detection and/or diagnosis of SARS-CoV-2 by  FDA under an Emergency Use Authorization (EUA). This EUA will remain  in effect (meaning this test can be used) for the duration of the  Covid-19 declaration under Section 564(b)(1) of the Act, 21  U.S.C. section 360bbb-3(b)(1), unless the authorization is  terminated or revoked. Performed at [facility], [address] [place],  [place] [address]     Coagulation Studies: No results for input(s): LABPROT, INR in the last 72 hours.  Urinalysis: Recent Labs    04/10/19 2244  COLORURINE YELLOW*  LABSPEC 1.013  PHURINE 5.0  GLUCOSEU NEGATIVE  HGBUR NEGATIVE  BILIRUBINUR NEGATIVE  KETONESUR NEGATIVE  PROTEINUR 30*  NITRITE POSITIVE*  LEUKOCYTESUR LARGE*      Imaging: MR BRAIN WO CONTRAST  Result Date: 04/11/2019 CLINICAL DATA:  Focal neuro deficit, greater than 6 hours. Right-sided weakness. EXAM: MRI HEAD WITHOUT CONTRAST TECHNIQUE: Multiplanar, multiecho pulse sequences of the brain and surrounding  structures were obtained without intravenous contrast. COMPARISON:  CT head without contrast 04/09/2019 FINDINGS: Brain: Multifocal areas of acute/subacute nonhemorrhagic infarction are present bilaterally. The largest cortical area involves the precentral and postcentral gyrus on the left, corresponding with right-sided weakness. A radial white matter infarct is present subjacent to the left middle frontal gyrus. Additional small cortical foci are present in the parietal lobe, the left occipital lobe of the right parietoccipital lobe, and the left posterior hippocampus. A 5 mm focus is present at the inferior right cerebellum. A more posterior remote right cerebellar infarct is present. Remote punctate foci are present in the left cerebellum and left paramedian brainstem. Confluent periventricular white matter changes extend into the internal capsule bilaterally. No hemorrhage or mass lesion is present. Vascular: Flow is present in the major intracranial arteries. Skull and upper cervical spine: The craniocervical junction is normal. Upper cervical spine is within normal limits. Marrow signal is unremarkable. Extensive  ossification the inter cerebral falx is again noted. Sinuses/Orbits: Bilateral mastoid effusions are present. No obstructing nasopharyngeal lesion is present. The paranasal sinuses and mastoid air cells are otherwise clear. The left globe has been replaced. Right globe bilateral orbits are otherwise normal. : 1. Multifocal areas of acute/subacute nonhemorrhagic infarction involving the left frontal, parietal, and occipital lobes as described. Involvement of the left precentral gyrus corresponds with the right-sided weakness. 2. Additional punctate foci of acute nonhemorrhagic infarct involving the inferior right cerebellum and left posterior hippocampus. 3. Remote punctate infarcts involving the left paramedian brainstem, left cerebellum, and left paramedian brainstem. 4. Extensive white matter  disease likely reflects the sequela of chronic microvascular ischemia. 5. Bilateral mastoid effusions. No obstructing nasopharyngeal lesion is present. Electronically Signed   By: San Morelle M.D.   On: 04/11/2019 14:14   DG Chest Port 1 View  Result Date: 04/11/2019 CLINICAL DATA:  Chest pain and shortness of breath. History of hypertension, hyperlipidemia, diet-controlled diabetes, stroke, GERD, depression, anxiety, left eye blindness, alcohol abuse in remission, CKD stage III, dCHF. EXAM: PORTABLE CHEST 1 VIEW COMPARISON:  04/07/2019 FINDINGS: Moderate enlargement of the cardiopericardial silhouette. Atherosclerotic calcification of the aortic arch. Reverse lordotic frontal projection. Upper zone pulmonary vascular prominence suspicious for pulmonary venous hypertension. No overt edema. No significant blunting of the lateral costophrenic angles. IMPRESSION: Moderate enlargement of the cardiopericardial silhouette with pulmonary venous hypertension, but no overt edema. Electronically Signed   By: Van Clines M.D.   On: 04/11/2019 10:54     Medications:   . sodium chloride    . cefTRIAXone (ROCEPHIN)  IV 1 g (04/12/19 1327)  . metronidazole 500 mg (04/12/19 1212)   . amLODipine  2.5 mg Oral Daily  . aspirin EC  81 mg Oral Daily  . atorvastatin  40 mg Oral q1800  . brimonidine  1 drop Right Eye BID  . carvedilol  25 mg Oral BID WC  . clopidogrel  75 mg Oral Daily  . folic acid  1 mg Oral Daily  . gabapentin  300 mg Oral TID  . heparin  5,000 Units Subcutaneous Q8H  . isosorbide mononitrate  60 mg Oral BID  . latanoprost  1 drop Right Eye QHS  . multivitamin with minerals  1 tablet Oral Daily  . pantoprazole  40 mg Oral Daily  . sodium chloride flush  10-40 mL Intracatheter Q12H  . thiamine  100 mg Oral Daily   Or  . thiamine  100 mg Intravenous Daily   sodium chloride, acetaminophen, hydrALAZINE, morphine injection, nitroGLYCERIN, ondansetron (ZOFRAN) IV, ondansetron  (ZOFRAN) IV, sodium chloride flush  Assessment/ Plan:  86 y.o. female with past medical history of coronary artery disease, left eye blindness, chronic kidney disease stage III, diastolic heart failure, GERD, hyperlipidemia, hypertension, CVA, diabetes mellitus type 2 who presented with chest pain.  1.  Acute kidney injury/chronic kidney disease stage IIIB baseline EGFR 54/diabetes mellitus type 2 with chronic kidney disease.  Suspect acute kidney injury likely related to contrast versus hypotension.  Creatinine slightly better today at 2.20 with a BUN of 33.  Renal function may be slow to recover given her underlying chronic kidney disease.  Continue to monitor renal parameters daily.  2.  Anemia chronic kidney disease.  Hemoglobin slightly low at 9.1.  Hold off on Epogen at this time.  3.  Hypertension.  Maintain the patient on amlodipine, carvedilol, and isosorbide mononitrate.   LOS: 4 Markea Ruzich 3/30/20212:42 PM

## 2019-04-13 ENCOUNTER — Encounter: Payer: Self-pay | Admitting: Internal Medicine

## 2019-04-13 LAB — BASIC METABOLIC PANEL
Anion gap: 7 (ref 5–15)
BUN: 29 mg/dL — ABNORMAL HIGH (ref 8–23)
CO2: 21 mmol/L — ABNORMAL LOW (ref 22–32)
Calcium: 8.5 mg/dL — ABNORMAL LOW (ref 8.9–10.3)
Chloride: 107 mmol/L (ref 98–111)
Creatinine, Ser: 2.03 mg/dL — ABNORMAL HIGH (ref 0.44–1.00)
GFR calc Af Amer: 25 mL/min — ABNORMAL LOW (ref >60)
GFR calc non Af Amer: 22 mL/min — ABNORMAL LOW (ref >60)
Glucose, Bld: 137 mg/dL — ABNORMAL HIGH (ref 70–99)
Potassium: 4.4 mmol/L (ref 3.5–5.1)
Sodium: 135 mmol/L (ref 135–145)

## 2019-04-13 MED ORDER — ASPIRIN 81 MG PO TABS: 81.0000 mg | ORAL_TABLET | Freq: Every day | ORAL | Status: AC

## 2019-04-13 MED ORDER — CLOPIDOGREL BISULFATE 75 MG PO TABS: 75.0000 mg | ORAL_TABLET | Freq: Every day | ORAL | 0 refills | Status: DC

## 2019-04-13 MED ORDER — AMLODIPINE BESYLATE 5 MG PO TABS
5.0000 mg | ORAL_TABLET | Freq: Every day | ORAL | Status: DC
  Administered 2019-04-13: 5 mg via ORAL
  Filled 2019-04-13: qty 1

## 2019-04-13 MED ORDER — AMLODIPINE BESYLATE 5 MG PO TABS: 5.0000 mg | ORAL_TABLET | Freq: Every day | ORAL | 0 refills | Status: DC

## 2019-04-13 MED ORDER — HYDRALAZINE HCL 10 MG PO TABS: 10.0000 mg | ORAL_TABLET | Freq: Three times a day (TID) | ORAL | Status: DC

## 2019-04-13 MED ORDER — ISOSORBIDE MONONITRATE ER 60 MG PO TB24: 60.0000 mg | ORAL_TABLET | Freq: Two times a day (BID) | ORAL | Status: DC

## 2019-04-13 NOTE — Progress Notes (Signed)
Subjective: Patient OOB.  No new neurological complaints.    Objective: Current vital signs: BP (!) 153/59 (BP Location: Right Arm)   Pulse 63   Temp 98.6 F (37 C) (Oral)   Resp 17   Ht 5' (1.524 m)   Wt 69.3 kg   SpO2 97%   BMI 29.84 kg/m  Vital signs in last 24 hours: Temp:  [97.7 F (36.5 C)-98.6 F (37 C)] 98.6 F (37 C) (03/31 0800) Pulse Rate:  [56-66] 63 (03/31 0800) Resp:  [17-19] 17 (03/31 0800) BP: (149-185)/(59-75) 153/59 (03/31 0800) SpO2:  [96 %-100 %] 97 % (03/31 0800) Weight:  [69.3 kg] 69.3 kg (03/31 0403)  Intake/Output from previous day: 03/30 0701 - 03/31 0700 In: 1022.4 [P.O.:720; I.V.:7; IV Piggyback:295.3] Out: 1300 [Urine:1300] Intake/Output this shift: Total I/O In: -  Out: 450 [Urine:450] Nutritional status:  Diet Order            Diet renal/carb modified with fluid restriction Diet-HS Snack? Nothing; Fluid restriction: 1200 mL Fluid; Room service appropriate? Yes; Fluid consistency: Thin  Diet effective now              Neurologic Exam: Mental Status: Alert, oriented, thought content appropriate.  Speech fluent without evidence of aphasia.  Able to follow 3 step commands without difficulty. Cranial Nerves: II: Visual fields grossly normal in the right eye, blind in the left eye III,IV, VI: ptosis not present, exotropia with left eye.  EOMI with right eye V,VII: smile symmetric, facial light touch sensation decreased on the right VIII: hearing normal bilaterally IX,X: gag reflex present XI: bilateral shoulder shrug XII: midline tongue extension Motor: Able to lift RUE above head. Sensory: Pinprick and light touch decreased on the right upper and lower extremities   Lab Results: Basic Metabolic Panel: Recent Labs  Lab 04/09/19 0457 04/09/19 0457 04/10/19 0810 04/10/19 0810 04/11/19 0459 04/12/19 0718 04/13/19 1021  NA 141  --  137  --  139 136 135  K 4.4  --  4.3  --  4.1 4.2 4.4  CL 109  --  106  --  109 105 107  CO2  23  --  22  --  23 22 21*  GLUCOSE 71  --  83  --  92 94 137*  BUN 29*  --  34*  --  35* 33* 29*  CREATININE 1.91*  --  2.44*  --  2.29* 2.20* 2.03*  CALCIUM 8.5*   < > 8.7*   < > 8.5* 8.8* 8.5*  PHOS  --   --   --   --  4.1  --   --    < > = values in this interval not displayed.    Liver Function Tests: Recent Labs  Lab 04/07/19 1116  AST 47*  ALT 27  ALKPHOS 75  BILITOT 0.7  PROT 7.6  ALBUMIN 3.6   No results for input(s): LIPASE, AMYLASE in the last 168 hours. No results for input(s): AMMONIA in the last 168 hours.  CBC: Recent Labs  Lab 04/07/19 1116 04/10/19 1733 04/11/19 0459 04/12/19 0718  WBC 7.6 6.9 5.3 5.1  HGB 12.1 10.1* 9.5* 9.1*  HCT 37.2 30.0* 29.2* 28.3*  MCV 91.4 92.3 93.0 93.7  PLT 229 176 195 180    Cardiac Enzymes: No results for input(s): CKTOTAL, CKMB, CKMBINDEX, TROPONINI in the last 168 hours.  Lipid Panel: Recent Labs  Lab 04/08/19 0437  CHOL 169  TRIG 154*  HDL 47  CHOLHDL  3.6  VLDL 31  LDLCALC 91    CBG: Recent Labs  Lab 04/08/19 0927 04/08/19 1036 04/08/19 1300 04/09/19 1429  GLUCAP 71 89 80 108*    Microbiology: Results for orders placed or performed during the hospital encounter of 04/07/19  Respiratory Panel by RT PCR (Flu A&B, Covid) - Nasopharyngeal Swab     Status: None   Collection Time: 04/07/19 12:54 PM   Specimen: Nasopharyngeal Swab  Result Value Ref Range Status   SARS Coronavirus 2 by RT PCR NEGATIVE NEGATIVE Final    Comment: (NOTE) SARS-CoV-2 target nucleic acids are NOT DETECTED. The SARS-CoV-2 RNA is generally detectable in upper respiratoy specimens during the acute phase of infection. The lowest concentration of SARS-CoV-2 viral copies this assay can detect is 131 copies/mL. A negative result does not preclude SARS-Cov-2 infection and should not be used as the sole basis for treatment or other patient management decisions. A negative result may occur with  improper specimen collection/handling,  submission of specimen other than nasopharyngeal swab, presence of viral mutation(s) within the areas targeted by this assay, and inadequate number of viral copies (<131 copies/mL). A negative result must be combined with clinical observations, patient history, and epidemiological information. The expected result is Negative. Fact Sheet for Patients:  PinkCheek.be Fact Sheet for Healthcare Providers:  GravelBags.it This test is not yet ap proved or cleared by the [place] FDA and  has been authorized for detection and/or diagnosis of SARS-CoV-2 by FDA under an Emergency Use Authorization (EUA). This EUA will remain  in effect (meaning this test can be used) for the duration of the COVID-19 declaration under Section 564(b)(1) of the Act, 21 U.S.C. section 360bbb-3(b)(1), unless the authorization is terminated or revoked sooner.    Influenza A by PCR NEGATIVE NEGATIVE Final   Influenza B by PCR NEGATIVE NEGATIVE Final    Comment: (NOTE) The Xpert Xpress SARS-CoV-2/FLU/RSV assay is intended as an aid in  the diagnosis of influenza from Nasopharyngeal swab specimens and  should not be used as a sole basis for treatment. Nasal washings and  aspirates are unacceptable for Xpert Xpress SARS-CoV-2/FLU/RSV  testing. Fact Sheet for Patients: PinkCheek.be Fact Sheet for Healthcare Providers: GravelBags.it This test is not yet approved or cleared by the [place] FDA and  has been authorized for detection and/or diagnosis of SARS-CoV-2 by  FDA under an Emergency Use Authorization (EUA). This EUA will remain  in effect (meaning this test can be used) for the duration of the  Covid-19 declaration under Section 564(b)(1) of the Act, 21  U.S.C. section 360bbb-3(b)(1), unless the authorization is  terminated or revoked. Performed at [facility], [address] [place], [place] [address]   Expectorated sputum assessment w rflx to resp cult     Status: None   Collection Time: 04/12/19  3:35 PM   Specimen: Expectorated Sputum  Result Value Ref Range Status   Specimen Description EXPECTORATED SPUTUM  Final   Special Requests NONE  Final   Sputum evaluation   Final    THIS SPECIMEN IS ACCEPTABLE FOR SPUTUM CULTURE Performed at [facility], [address] [place], [place] [address]    Report Status 04/12/2019 FINAL  Final  Culture, respiratory     Status: None (Preliminary result)   Collection Time: 04/12/19  3:35 PM  Result Value Ref Range Status   Specimen Description   Final    EXPECTORATED SPUTUM Performed at [facility], [address] [place], [place]  [address]    Special Requests   Final    NONE Reflexed from [phone] Performed at [facility], [address] [place], [place] [address]    Gram Stain   Final    FEW WBC PRESENT, PREDOMINANTLY PMN ABUNDANT GRAM POSITIVE COCCI MODERATE GRAM POSITIVE RODS RARE YEAST    Culture   Final    CULTURE REINCUBATED FOR BETTER GROWTH Performed at [facility], [place] [address] [place], [place] [address]    Report Status PENDING  Incomplete    Coagulation Studies: No results for input(s): LABPROT, INR in the last 72 hours.  Imaging: MR BRAIN WO CONTRAST  Result Date: 04/11/2019 CLINICAL DATA:  Focal neuro deficit, greater than 6 hours. Right-sided weakness. EXAM: MRI HEAD WITHOUT CONTRAST TECHNIQUE: Multiplanar, multiecho pulse sequences of the brain and surrounding structures were obtained without intravenous contrast. COMPARISON:  CT head without contrast 04/09/2019 FINDINGS: Brain: Multifocal areas of acute/subacute nonhemorrhagic infarction are present bilaterally. The largest cortical area involves the precentral and postcentral gyrus on the left, corresponding with right-sided weakness. A radial white matter infarct is present subjacent to the left  middle frontal gyrus. Additional small cortical foci are present in the parietal lobe, the left occipital lobe of the right parietoccipital lobe, and the left posterior hippocampus. A 5 mm focus is present at the inferior right cerebellum. A more posterior remote right cerebellar infarct is present. Remote punctate foci are present in the left cerebellum and left paramedian brainstem. Confluent periventricular white matter changes extend into the internal capsule bilaterally. No hemorrhage or mass lesion is present. Vascular: Flow is present in the major intracranial arteries. Skull and upper cervical spine: The craniocervical junction is normal. Upper cervical spine is within normal limits. Marrow signal is unremarkable. Extensive ossification the inter cerebral falx is again noted. Sinuses/Orbits: Bilateral mastoid effusions are present. No obstructing nasopharyngeal lesion is present. The paranasal sinuses and mastoid air cells are otherwise clear. The left globe has been replaced. Right globe bilateral orbits are otherwise normal. : 1. Multifocal areas of acute/subacute nonhemorrhagic infarction involving the left frontal, parietal, and occipital lobes as described. Involvement of the left precentral gyrus corresponds with the right-sided weakness. 2. Additional punctate foci of acute nonhemorrhagic infarct involving the inferior right cerebellum and left posterior hippocampus. 3. Remote punctate infarcts involving the left paramedian brainstem, left cerebellum, and left paramedian brainstem. 4. Extensive white matter disease likely reflects the sequela of chronic microvascular ischemia. 5. Bilateral mastoid effusions. No obstructing nasopharyngeal lesion is present. Electronically Signed   By: San Morelle M.D.   On: 04/11/2019 14:14   ECHOCARDIOGRAM COMPLETE BUBBLE STUDY  Result Date: 04/13/2019    ECHOCARDIOGRAM REPORT   Patient Name:   [name] Date of Exam: 04/12/2019 Medical Rec #:   [id number]        Height:       60.0 in Accession #:    [id number]       Weight:       151.0 lb Date of Birth:  [birth year]        BSA:          1.656 m Patient Age:    86 years         BP:           170/75 mmHg Patient Gender: F                HR:           63 bpm. Exam  Location:  [facility] Procedure: 2D Echo, Cardiac Doppler, Color Doppler and Saline Contrast Bubble            Study Indications:     I63.9 Stroke  History:         Patient has prior history of Echocardiogram examinations, most                  recent 11/02/2018. Risk Factors:Hypertension, Diabetes and                  Dyslipidemia. Stroke. Dyspnea. Chronic kidney disease.  Sonographer:     Wilford Sports Rodgers-Jones Referring Phys:  Elizabeth Diagnosing Phys: Nelva Bush MD IMPRESSIONS  1. Left ventricular ejection fraction, by estimation, is 55 to 60%. The left ventricle has normal function. The left ventricle demonstrates regional wall motion abnormalities (see scoring diagram/findings for description). There is mild left ventricular  hypertrophy. Left ventricular diastolic parameters are consistent with Grade I diastolic dysfunction (impaired relaxation). Elevated left atrial pressure. There is moderate hypokinesis of the left ventricular, basal inferior wall and inferolateral wall.  2. Right ventricular systolic function is normal. The right ventricular size is normal. There is moderately elevated pulmonary artery systolic pressure.  3. Left atrial size was severely dilated.  4. The mitral valve is degenerative. Mild to moderate mitral valve regurgitation.  5. Tricuspid valve regurgitation is mild to moderate.  6. The aortic valve was not well visualized. Aortic valve regurgitation is moderate. Mild to moderate aortic valve sclerosis/calcification is present, without any evidence of aortic stenosis.  7. The inferior vena cava is dilated in size with <50% respiratory variability, suggesting right atrial pressure of 15 mmHg.  8. Agitated  saline contrast bubble study was negative, with no evidence of any interatrial shunt. FINDINGS  Left Ventricle: Left ventricular ejection fraction, by estimation, is 55 to 60%. The left ventricle has normal function. The left ventricle demonstrates regional wall motion abnormalities. Moderate hypokinesis of the left ventricular, basal inferior wall and inferolateral wall. The left ventricular internal cavity size was normal in size. There is mild left ventricular hypertrophy. Left ventricular diastolic parameters are consistent with Grade I diastolic dysfunction (impaired relaxation). Elevated  left atrial pressure. Right Ventricle: The right ventricular size is normal. No increase in right ventricular wall thickness. Right ventricular systolic function is normal. There is moderately elevated pulmonary artery systolic pressure. The tricuspid regurgitant velocity is 3.05 m/s, and with an assumed right atrial pressure of 15 mmHg, the estimated right ventricular systolic pressure is 50.3 mmHg. Left Atrium: Left atrial size was severely dilated. Right Atrium: Right atrial size was normal in size. Pericardium: There is no evidence of pericardial effusion. Mitral Valve: The mitral valve is degenerative in appearance. There is mild thickening of the mitral valve leaflet(s). Moderate mitral annular calcification. Mild to moderate mitral valve regurgitation. Tricuspid Valve: The tricuspid valve is not well visualized. Tricuspid valve regurgitation is mild to moderate. Aortic Valve: The aortic valve was not well visualized. . There is moderate thickening and mild calcification of the aortic valve. Aortic valve regurgitation is moderate. Aortic regurgitation PHT measures 322 msec. Mild to moderate aortic valve sclerosis/calcification is present, without any evidence of aortic stenosis. There is moderate thickening of the aortic valve. There is mild calcification of the aortic valve. Aortic valve mean gradient measures 8.0  mmHg. Aortic valve peak gradient measures 11.8 mmHg. Pulmonic Valve: The pulmonic valve was not well visualized. Pulmonic valve regurgitation is mild. No evidence of pulmonic stenosis. Aorta: The  aortic root is normal in size and structure. Pulmonary Artery: The pulmonary artery is not well seen. Venous: The inferior vena cava is dilated in size with less than 50% respiratory variability, suggesting right atrial pressure of 15 mmHg. IAS/Shunts: The interatrial septum was not well visualized. Agitated saline contrast was given intravenously to evaluate for intracardiac shunting. Agitated saline contrast bubble study was negative, with no evidence of any interatrial shunt.  LEFT VENTRICLE PLAX 2D LVIDd:         3.63 cm Diastology LVIDs:         2.37 cm LV e' lateral:   6.09 cm/s LV PW:         1.12 cm LV E/e' lateral: 14.4 LV IVS:        1.11 cm LV e' medial:    5.11 cm/s                        LV E/e' medial:  17.1  RIGHT VENTRICLE RV Basal diam:  3.83 cm RV S prime:     18.60 cm/s TAPSE (M-mode): 2.2 cm LEFT ATRIUM              Index       RIGHT ATRIUM           Index LA diam:        5.20 cm  3.14 cm/m  RA Area:     14.10 cm LA Vol (A2C):   114.0 ml 68.82 ml/m RA Volume:   39.20 ml  23.66 ml/m LA Vol (A4C):   88.4 ml  53.37 ml/m LA Biplane Vol: 105.0 ml 63.39 ml/m  AORTIC VALVE AV Vmax:           172.00 cm/s AV Vmean:          130.000 cm/s AV VTI:            0.352 m AV Peak Grad:      11.8 mmHg AV Mean Grad:      8.0 mmHg LVOT Vmax:         90.90 cm/s LVOT Vmean:        59.150 cm/s LVOT VTI:          0.184 m LVOT/AV VTI ratio: 0.52 AI PHT:            322 msec  AORTA Ao Root diam: 3.30 cm MITRAL VALVE                TRICUSPID VALVE MV Area (PHT): 2.76 cm     TR Peak grad:   37.2 mmHg MV Decel Time: 275 msec     TR Vmax:        305.00 cm/s MV E velocity: 87.50 cm/s MV A velocity: 114.00 cm/s  SHUNTS MV E/A ratio:  0.77         Systemic VTI: 0.18 m Nelva Bush MD Electronically signed by Nelva Bush MD  Signature Date/Time: 04/13/2019/6:51:35 AM    Final     Medications:  I have reviewed the patient's current medications. Scheduled: . amLODipine  5 mg Oral Daily  . aspirin EC  81 mg Oral Daily  . atorvastatin  40 mg Oral q1800  . brimonidine  1 drop Right Eye BID  . carvedilol  25 mg Oral BID WC  . clopidogrel  75 mg Oral Daily  . folic acid  1 mg Oral Daily  . gabapentin  300 mg Oral TID  . heparin  5,000  Units Subcutaneous Q8H  . isosorbide mononitrate  60 mg Oral BID  . latanoprost  1 drop Right Eye QHS  . multivitamin with minerals  1 tablet Oral Daily  . pantoprazole  40 mg Oral Daily  . sodium chloride flush  10-40 mL Intracatheter Q12H  . thiamine  100 mg Oral Daily   Or  . thiamine  100 mg Intravenous Daily    Assessment/Plan: 86 y.o. female with medical history significant ofhypertension, hyperlipidemia, diet-controlled diabetes, stroke, GERD, depression, anxiety, left eye blindness, alcohol abuse in remission, CKD stage III,dCHF, who presented on 3/25 with chest pain.  Underwent heart cath on 3/26.  Currently with complaint of a right hemiparesis that is improving. MRI of the brain personally reviewed and shows multiple small areas of acute/subacute infarction, left greater than right, but in multiple vascular territories.  Etiology likely embolic and related to procedure which appeared to be technically challenging.  Patient also with multiple vascular risk factors.  Has been noted to have atrial tachycardia on this admission as well.  On ASA and statin prior to admission.    Carotid dopplers pending.  Echocardiogram shows no cardiac source of emboli severely dilated left atrium , negative bubble study and an EF of 55-60%.  A1c 6.2, LDL 91.   Stroke Risk Factors - diabetes mellitus, hyperlipidemia and hypertension  Plan: 1. PT consult, OT consult, Speech consult 2. Prophylactic therapy-Dual antiplatelet therapy with ASA 81mg  and Plavix 75mg  for three weeks with change  to Plavix 75mg  daily alone as monotherapy after that time. 3. Telemetry monitoring 4. Frequent neuro checks 5. Aggressive lipid management with target LDL<70 6. Carotid dopplers are pending.  If no evidence of hemodynamically significant stenosis no further neurologic intervention is recommended at this time.  If further questions arise, please call or page at that time.  Thank you for allowing neurology to participate in the care of this patient.    LOS: 5 days   Alexis Goodell, MD Neurology [phone] 04/13/2019  10:56 AM

## 2019-04-13 NOTE — Progress Notes (Signed)
Occupational Therapy Treatment Patient Details Name: [name] MRN: [id number] DOB: 1932/03/19 Today's Date: 04/13/2019    History of present illness presented to ER secondary to substernal chest pain, SOB; admitted for management of chest pain with elevated troponin.  s/p cardiac cath 3/26 significant for occluded RCA with collaterals L to R, recommended for medical management.   OT comments  Patient seated in recliner upon entry and agreeable to therapy.  Patient with no complaints of pain.  Discussed use of energy conservation techniques within the home to improve overall safety and level of independence.  Patient verbalized understanding.  Practiced ADL item retrieval using RW for stability.  Patient requires CGA and frequent cuing for safety.  Patient reaches for items out of BOS and demonstrates unsafe twisting.  Discussed item organization to improve item retrieval safety. Patient tolerated treatment well.  Based on today's performance, follow up recommendations remain appropriate.     Follow Up Recommendations  Home health OT;Supervision/Assistance - 24 hour    Equipment Recommendations  Other (comment)(grab bars)    Recommendations for Other Services      Precautions / Restrictions Precautions Precautions: Fall Restrictions Weight Bearing Restrictions: No       Mobility Bed Mobility               General bed mobility comments: Patient in chair upon entry.  Transfers Overall transfer level: Needs assistance Equipment used: Rolling walker (2 wheeled) Transfers: Sit to/from [facility] Sit to Stand: Min guard Stand pivot transfers: Min guard       General transfer comment: Requires cues for optimal safety with reaching out of BOS and general use of RW.    Balance                                           ADL either performed or assessed with clinical judgement   ADL Overall ADL's : Needs assistance/impaired                                      Functional mobility during ADLs: Min guard;Rolling walker General ADL Comments: Completed ADL item retrieval within room using RW.  CGA needed for safety.  Patient with poor vision on L side and noted to lean to R/bump in to items.     Vision Patient Visual Report: No change from baseline     Perception     Praxis      Cognition Arousal/Alertness: Awake/alert Behavior During Therapy: WFL for tasks assessed/performed Overall Cognitive Status: Within Functional Limits for tasks assessed                                 General Comments: Cooperative and pleasnt throughout.  Some deficits noted in safety awareness.        Exercises Other Exercises Other Exercises: Patient completed ADL item retrieval within room using RW with CGA.  Pt noted to have some deficits in safety awareness and impulse control.  Patient often reaching out of BOS and twisting in an unsafe manner. Other Exercises: Provided education on use of energy conservation techniques within home to improve safety and independence. Other Exercises: Provided education on safety with use of body mechanics and sequencing during functional transfers and functional  mobility using RW.   Shoulder Instructions       General Comments      Pertinent Vitals/ Pain       Pain Assessment: No/denies pain  Home Living                                          Prior Functioning/Environment              Frequency  Min 2X/week        Progress Toward Goals  OT Goals(current goals can now be found in the care plan section)  Progress towards OT goals: Progressing toward goals     Plan Discharge plan remains appropriate;Frequency remains appropriate    Co-evaluation                 AM-PAC OT "6 Clicks" Daily Activity     Outcome Measure                    End of Session Equipment Utilized During Treatment: Rolling walker;Gait  belt  OT Visit Diagnosis: Unsteadiness on feet (R26.81);Other abnormalities of gait and mobility (R26.89)   Activity Tolerance Patient tolerated treatment well   Patient Left in chair;with call bell/phone within reach;with chair alarm set   Nurse Communication Other (comment)(Requesting patient to be unhooked from IV for functional mobility)        Time: 1428-1450 OT Time Calculation (min): 22 min  Charges: OT General Charges $OT Visit: 1 Visit OT Treatments $Self Care/Home Management : 8-22 mins  Baldomero Lamy, MS, OTR/L 04/13/19, 3:04 PM

## 2019-04-13 NOTE — Progress Notes (Signed)
Discharge instructions explained to pt and pt's family/verbalized understanding. Midline and tele removed. Will transport off unit via wheelchair.

## 2019-04-13 NOTE — Progress Notes (Signed)
Progress Note  Patient Name: [name] Date of Encounter: 04/13/2019  Primary Cardiologist: Sinclair Grooms, MD   Subjective   Patient has occasional shortness of breath when walking.  Denies chest pain.  Otherwise doing okay.  Creatinine pending.  Inpatient Medications    Scheduled Meds: . amLODipine  5 mg Oral Daily  . aspirin EC  81 mg Oral Daily  . atorvastatin  40 mg Oral q1800  . brimonidine  1 drop Right Eye BID  . carvedilol  25 mg Oral BID WC  . clopidogrel  75 mg Oral Daily  . folic acid  1 mg Oral Daily  . gabapentin  300 mg Oral TID  . heparin  5,000 Units Subcutaneous Q8H  . isosorbide mononitrate  60 mg Oral BID  . latanoprost  1 drop Right Eye QHS  . multivitamin with minerals  1 tablet Oral Daily  . pantoprazole  40 mg Oral Daily  . sodium chloride flush  10-40 mL Intracatheter Q12H  . thiamine  100 mg Oral Daily   Or  . thiamine  100 mg Intravenous Daily   Continuous Infusions: . sodium chloride 250 mL (04/13/19 0356)  . cefTRIAXone (ROCEPHIN)  IV 1 g (04/13/19 0928)  . metronidazole 500 mg (04/13/19 0356)   PRN Meds: sodium chloride, acetaminophen, hydrALAZINE, morphine injection, nitroGLYCERIN, ondansetron (ZOFRAN) IV, ondansetron (ZOFRAN) IV, sodium chloride flush   Vital Signs    Vitals:   04/12/19 2028 04/13/19 0003 04/13/19 0403 04/13/19 0800  BP: (!) 183/73 (!) 166/62 (!) 154/61 (!) 153/59  Pulse: 66 62 64 63  Resp:    17  Temp:  98.6 F (37 C) 98 F (36.7 C) 98.6 F (37 C)  TempSrc:  Oral Oral Oral  SpO2:  96% 97% 97%  Weight:   69.3 kg   Height:        Intake/Output Summary (Last 24 hours) at 04/13/2019 0957 Last data filed at 04/13/2019 0934 Gross per 24 hour  Intake 782.38 ml  Output 1300 ml  Net -517.62 ml   Last 3 Weights 04/13/2019 04/12/2019 04/10/2019  Weight (lbs) 152 lb 12.8 oz 151 lb 146 lb 4.8 oz  Weight (kg) 69.31 kg 68.493 kg 66.361 kg      Telemetry    sinus - Personally Reviewed   Physical Exam    GEN: No acute distress.   Neck: No JVD Cardiac: RRR, 2/6 systolic murmur.  Respiratory: Clear to auscultation bilaterally. GI: Soft, nontender, non-distended  MS: No edema; No deformity. Neuro:  Nonfocal  Psych: Normal affect   Labs    High Sensitivity Troponin:   Recent Labs  Lab 04/07/19 1116 04/07/19 1254 04/07/19 1622 04/07/19 1901  TROPONINIHS 60* 71* 88* 81*      Chemistry Recent Labs  Lab 04/07/19 1116 04/08/19 0437 04/10/19 0810 04/11/19 0459 04/12/19 0718  NA 140   < > 137 139 136  K 4.3   < > 4.3 4.1 4.2  CL 107   < > 106 109 105  CO2 25   < > 22 23 22   GLUCOSE 118*   < > 83 92 94  BUN 18   < > 34* 35* 33*  CREATININE 1.14*   < > 2.44* 2.29* 2.20*  CALCIUM 9.1   < > 8.7* 8.5* 8.8*  PROT 7.6  --   --   --   --   ALBUMIN 3.6  --   --   --   --  AST 47*  --   --   --   --   ALT 27  --   --   --   --   ALKPHOS 75  --   --   --   --   BILITOT 0.7  --   --   --   --   GFRNONAA 44*   < > 18* 19* 20*  GFRAA 51*   < > 20* 22* 23*  ANIONGAP 8   < > 9 7 9    < > = values in this interval not displayed.     Hematology Recent Labs  Lab 04/10/19 1733 04/11/19 0459 04/12/19 0718  WBC 6.9 5.3 5.1  RBC 3.25* 3.14* 3.02*  HGB 10.1* 9.5* 9.1*  HCT 30.0* 29.2* 28.3*  MCV 92.3 93.0 93.7  MCH 31.1 30.3 30.1  MCHC 33.7 32.5 32.2  RDW 14.1 14.2 14.0  PLT 176 195 180    BNP Recent Labs  Lab 04/07/19 1307  BNP 1,028.0*     DDimer No results for input(s): DDIMER in the last 168 hours.   Radiology    MR BRAIN WO CONTRAST  Result Date: 04/11/2019 CLINICAL DATA:  Focal neuro deficit, greater than 6 hours. Right-sided weakness. EXAM: MRI HEAD WITHOUT CONTRAST TECHNIQUE: Multiplanar, multiecho pulse sequences of the brain and surrounding structures were obtained without intravenous contrast. COMPARISON:  CT head without contrast 04/09/2019 FINDINGS: Brain: Multifocal areas of acute/subacute nonhemorrhagic infarction are present bilaterally. The largest  cortical area involves the precentral and postcentral gyrus on the left, corresponding with right-sided weakness. A radial white matter infarct is present subjacent to the left middle frontal gyrus. Additional small cortical foci are present in the parietal lobe, the left occipital lobe of the right parietoccipital lobe, and the left posterior hippocampus. A 5 mm focus is present at the inferior right cerebellum. A more posterior remote right cerebellar infarct is present. Remote punctate foci are present in the left cerebellum and left paramedian brainstem. Confluent periventricular white matter changes extend into the internal capsule bilaterally. No hemorrhage or mass lesion is present. Vascular: Flow is present in the major intracranial arteries. Skull and upper cervical spine: The craniocervical junction is normal. Upper cervical spine is within normal limits. Marrow signal is unremarkable. Extensive ossification the inter cerebral falx is again noted. Sinuses/Orbits: Bilateral mastoid effusions are present. No obstructing nasopharyngeal lesion is present. The paranasal sinuses and mastoid air cells are otherwise clear. The left globe has been replaced. Right globe bilateral orbits are otherwise normal. : 1. Multifocal areas of acute/subacute nonhemorrhagic infarction involving the left frontal, parietal, and occipital lobes as described. Involvement of the left precentral gyrus corresponds with the right-sided weakness. 2. Additional punctate foci of acute nonhemorrhagic infarct involving the inferior right cerebellum and left posterior hippocampus. 3. Remote punctate infarcts involving the left paramedian brainstem, left cerebellum, and left paramedian brainstem. 4. Extensive white matter disease likely reflects the sequela of chronic microvascular ischemia. 5. Bilateral mastoid effusions. No obstructing nasopharyngeal lesion is present. Electronically Signed   By: San Morelle M.D.   On: 04/11/2019  14:14   DG Chest Port 1 View  Result Date: 04/11/2019 CLINICAL DATA:  Chest pain and shortness of breath. History of hypertension, hyperlipidemia, diet-controlled diabetes, stroke, GERD, depression, anxiety, left eye blindness, alcohol abuse in remission, CKD stage III, dCHF. EXAM: PORTABLE CHEST 1 VIEW COMPARISON:  04/07/2019 FINDINGS: Moderate enlargement of the cardiopericardial silhouette. Atherosclerotic calcification of the aortic arch. Reverse lordotic frontal projection. Upper  zone pulmonary vascular prominence suspicious for pulmonary venous hypertension. No overt edema. No significant blunting of the lateral costophrenic angles. IMPRESSION: Moderate enlargement of the cardiopericardial silhouette with pulmonary venous hypertension, but no overt edema. Electronically Signed   By: Van Clines M.D.   On: 04/11/2019 10:54   ECHOCARDIOGRAM COMPLETE BUBBLE STUDY  Result Date: 04/13/2019    ECHOCARDIOGRAM REPORT   Patient Name:   PALMINA CLODFELTER Date of Exam: 04/12/2019 Medical Rec #:  [id number]        Height:       60.0 in Accession #:    [id number]       Weight:       151.0 lb Date of Birth:  [birth year]        BSA:          1.656 m Patient Age:    86 years         BP:           170/75 mmHg Patient Gender: F                HR:           63 bpm. Exam Location:  [facility] Procedure: 2D Echo, Cardiac Doppler, Color Doppler and Saline Contrast Bubble            Study Indications:     I63.9 Stroke  History:         Patient has prior history of Echocardiogram examinations, most                  recent 11/02/2018. Risk Factors:Hypertension, Diabetes and                  Dyslipidemia. Stroke. Dyspnea. Chronic kidney disease.  Sonographer:     Wilford Sports Rodgers-Jones Referring Phys:  [place] Diagnosing Phys: Nelva Bush MD IMPRESSIONS  1. Left ventricular ejection fraction, by estimation, is 55 to 60%. The left ventricle has normal function. The left ventricle demonstrates regional wall motion  abnormalities (see scoring diagram/findings for description). There is mild left ventricular  hypertrophy. Left ventricular diastolic parameters are consistent with Grade I diastolic dysfunction (impaired relaxation). Elevated left atrial pressure. There is moderate hypokinesis of the left ventricular, basal inferior wall and inferolateral wall.  2. Right ventricular systolic function is normal. The right ventricular size is normal. There is moderately elevated pulmonary artery systolic pressure.  3. Left atrial size was severely dilated.  4. The mitral valve is degenerative. Mild to moderate mitral valve regurgitation.  5. Tricuspid valve regurgitation is mild to moderate.  6. The aortic valve was not well visualized. Aortic valve regurgitation is moderate. Mild to moderate aortic valve sclerosis/calcification is present, without any evidence of aortic stenosis.  7. The inferior vena cava is dilated in size with <50% respiratory variability, suggesting right atrial pressure of 15 mmHg.  8. Agitated saline contrast bubble study was negative, with no evidence of any interatrial shunt. FINDINGS  Left Ventricle: Left ventricular ejection fraction, by estimation, is 55 to 60%. The left ventricle has normal function. The left ventricle demonstrates regional wall motion abnormalities. Moderate hypokinesis of the left ventricular, basal inferior wall and inferolateral wall. The left ventricular internal cavity size was normal in size. There is mild left ventricular hypertrophy. Left ventricular diastolic parameters are consistent with Grade I diastolic dysfunction (impaired relaxation). Elevated  left atrial pressure. Right Ventricle: The right ventricular size is normal. No increase in right ventricular wall thickness. Right ventricular systolic function is  normal. There is moderately elevated pulmonary artery systolic pressure. The tricuspid regurgitant velocity is 3.05 m/s, and with an assumed right atrial pressure of  15 mmHg, the estimated right ventricular systolic pressure is 54.6 mmHg. Left Atrium: Left atrial size was severely dilated. Right Atrium: Right atrial size was normal in size. Pericardium: There is no evidence of pericardial effusion. Mitral Valve: The mitral valve is degenerative in appearance. There is mild thickening of the mitral valve leaflet(s). Moderate mitral annular calcification. Mild to moderate mitral valve regurgitation. Tricuspid Valve: The tricuspid valve is not well visualized. Tricuspid valve regurgitation is mild to moderate. Aortic Valve: The aortic valve was not well visualized. . There is moderate thickening and mild calcification of the aortic valve. Aortic valve regurgitation is moderate. Aortic regurgitation PHT measures 322 msec. Mild to moderate aortic valve sclerosis/calcification is present, without any evidence of aortic stenosis. There is moderate thickening of the aortic valve. There is mild calcification of the aortic valve. Aortic valve mean gradient measures 8.0 mmHg. Aortic valve peak gradient measures 11.8 mmHg. Pulmonic Valve: The pulmonic valve was not well visualized. Pulmonic valve regurgitation is mild. No evidence of pulmonic stenosis. Aorta: The aortic root is normal in size and structure. Pulmonary Artery: The pulmonary artery is not well seen. Venous: The inferior vena cava is dilated in size with less than 50% respiratory variability, suggesting right atrial pressure of 15 mmHg. IAS/Shunts: The interatrial septum was not well visualized. Agitated saline contrast was given intravenously to evaluate for intracardiac shunting. Agitated saline contrast bubble study was negative, with no evidence of any interatrial shunt.  LEFT VENTRICLE PLAX 2D LVIDd:         3.63 cm Diastology LVIDs:         2.37 cm LV e' lateral:   6.09 cm/s LV PW:         1.12 cm LV E/e' lateral: 14.4 LV IVS:        1.11 cm LV e' medial:    5.11 cm/s                        LV E/e' medial:  17.1  RIGHT  VENTRICLE RV Basal diam:  3.83 cm RV S prime:     18.60 cm/s TAPSE (M-mode): 2.2 cm LEFT ATRIUM              Index       RIGHT ATRIUM           Index LA diam:        5.20 cm  3.14 cm/m  RA Area:     14.10 cm LA Vol (A2C):   114.0 ml 68.82 ml/m RA Volume:   39.20 ml  23.66 ml/m LA Vol (A4C):   88.4 ml  53.37 ml/m LA Biplane Vol: 105.0 ml 63.39 ml/m  AORTIC VALVE AV Vmax:           172.00 cm/s AV Vmean:          130.000 cm/s AV VTI:            0.352 m AV Peak Grad:      11.8 mmHg AV Mean Grad:      8.0 mmHg LVOT Vmax:         90.90 cm/s LVOT Vmean:        59.150 cm/s LVOT VTI:          0.184 m LVOT/AV VTI ratio: 0.52 AI PHT:  322 msec  AORTA Ao Root diam: 3.30 cm MITRAL VALVE                TRICUSPID VALVE MV Area (PHT): 2.76 cm     TR Peak grad:   37.2 mmHg MV Decel Time: 275 msec     TR Vmax:        305.00 cm/s MV E velocity: 87.50 cm/s MV A velocity: 114.00 cm/s  SHUNTS MV E/A ratio:  0.77         Systemic VTI: 0.18 m Nelva Bush MD Electronically signed by Nelva Bush MD Signature Date/Time: 04/13/2019/6:51:35 AM    Final     Cardiac Studies   TTE 11/02/2018 1. Left ventricular ejection fraction, by visual estimation, is 60 to  65%. The left ventricle has normal function. Normal left ventricular size.  Left ventricular septal wall thickness was mildly increased. Mildly  increased left ventricular posterior  wall thickness.  2. Left ventricular diastolic Doppler parameters are consistent with  impaired relaxation pattern of LV diastolic filling.  3. Elevated left ventricular end-diastolic pressure.  4. Global right ventricle has normal systolic function.The right  ventricular size is normal. No increase in right ventricular wall  thickness.  5. Left atrial size was moderately dilated.  6. Right atrial size was normal.  7. Moderate mitral annular calcification.  8. The mitral valve is normal in structure. Trace mitral valve  regurgitation. No evidence of mitral  stenosis.  9. The tricuspid valve is normal in structure. Tricuspid valve  regurgitation is mild.  10. The aortic valve is tricuspid. Aortic valve regurgitation is trivial  by color flow Doppler. Moderate aortic valve sclerosis/calcification  without any evidence of aortic stenosis.  11. The pulmonic valve was normal in structure. Pulmonic valve  regurgitation is mild by color flow Doppler.  12. Normal pulmonary artery systolic pressure.  13. The inferior vena cava is normal in size with greater than 50%  respiratory variability, suggesting right atrial pressure of 3 mmHg.   LHC 04/08/2019  Ost RCA to Mid RCA lesion is 100% stenosed.  Prox Cx lesion is 30% stenosed.  Mid LAD lesion is 30% stenosed.  Patient Profile     86 y.o. female history of chronic chest pain, hypertension, hyperlipidemia, stage III kidney disease, diabetes, presenting with chest pain, hypertensive urgency, elevated troponin.  Assessment & Plan    1. SVT -no further occurrence -cont coreg  2. Hx of CAD. occ RCA, elevated troponins -Left heart cath with occluded RCA.  Medical management -cont asa, statin, coreg, imdur  3.  Hypertension -Blood pressure still elevated.  Increase amlodipine to 5 mg daily. -Continue all other BP meds as prescribed. -No further intervention or testing planned from a cardiac perspective. -Patient can be discharged from a cardiac perspective pending input regarding renal function from renal team.  4. Renal dysfunction -likely atn post cath -Cr continues to improve.  Today's renal function test is pending -avoid nephrotoxic agents      Signed, Kate Sable, MD  04/13/2019, 9:57 AM

## 2019-04-13 NOTE — Plan of Care (Signed)
  Problem: Activity: Goal: Ability to return to baseline activity level will improve Outcome: Progressing   

## 2019-04-13 NOTE — Progress Notes (Signed)
[facility]  ROUNDING NOTE   Subjective:  Patient feeling well today. He gets periods. Asking when she could go home. Creatinine now down to 2.03.  Objective:  Vital signs in last 24 hours:  Temp:  [97.7 F (36.5 C)-98.6 F (37 C)] 97.8 F (36.6 C) (03/31 1116) Pulse Rate:  [58-66] 58 (03/31 1116) Resp:  [17-19] 17 (03/31 1116) BP: (139-185)/(59-75) 139/60 (03/31 1116) SpO2:  [96 %-99 %] 99 % (03/31 1116) Weight:  [69.3 kg] 69.3 kg (03/31 0403)  Weight change: 0.816 kg Filed Weights   04/10/19 0418 04/12/19 0335 04/13/19 0403  Weight: 66.4 kg 68.5 kg 69.3 kg    Intake/Output: I/O last 3 completed shifts: In: 1022.4 [P.O.:720; I.V.:7; IV Piggyback:295.3] Out: 1500 [Urine:1500]   Intake/Output this shift:  Total I/O In: -  Out: 450 [Urine:450]  Physical Exam: General: No acute distress  Head: Normocephalic, atraumatic. Moist oral mucosal membranes  Eyes: Anicteric, left eye prosthesis  Neck: Supple, trachea midline  Lungs:  Clear to auscultation, normal effort  Heart: S1S2 no rubs  Abdomen:  Soft, nontender, bowel sounds present  Extremities: No peripheral edema.  Neurologic: Awake, alert, following commands  Skin: No lesions       Basic Metabolic Panel: Recent Labs  Lab 04/09/19 0457 04/09/19 0457 04/10/19 0810 04/10/19 0810 04/11/19 0459 04/12/19 0718 04/13/19 1021  NA 141  --  137  --  139 136 135  K 4.4  --  4.3  --  4.1 4.2 4.4  CL 109  --  106  --  109 105 107  CO2 23  --  22  --  23 22 21*  GLUCOSE 71  --  83  --  92 94 137*  BUN 29*  --  34*  --  35* 33* 29*  CREATININE 1.91*  --  2.44*  --  2.29* 2.20* 2.03*  CALCIUM 8.5*   < > 8.7*   < > 8.5* 8.8* 8.5*  PHOS  --   --   --   --  4.1  --   --    < > = values in this interval not displayed.    Liver Function Tests: Recent Labs  Lab 04/07/19 1116  AST 47*  ALT 27  ALKPHOS 75  BILITOT 0.7  PROT 7.6  ALBUMIN 3.6   No results for input(s): LIPASE, AMYLASE in the last  168 hours. No results for input(s): AMMONIA in the last 168 hours.  CBC: Recent Labs  Lab 04/07/19 1116 04/10/19 1733 04/11/19 0459 04/12/19 0718  WBC 7.6 6.9 5.3 5.1  HGB 12.1 10.1* 9.5* 9.1*  HCT 37.2 30.0* 29.2* 28.3*  MCV 91.4 92.3 93.0 93.7  PLT 229 176 195 180    Cardiac Enzymes: No results for input(s): CKTOTAL, CKMB, CKMBINDEX, TROPONINI in the last 168 hours.  BNP: Invalid input(s): POCBNP  CBG: Recent Labs  Lab 04/08/19 0927 04/08/19 1036 04/08/19 1300 04/09/19 1429  GLUCAP 71 89 80 108*    Microbiology: Results for orders placed or performed during the hospital encounter of 04/07/19  Respiratory Panel by RT PCR (Flu A&B, Covid) - Nasopharyngeal Swab     Status: None   Collection Time: 04/07/19 12:54 PM   Specimen: Nasopharyngeal Swab  Result Value Ref Range Status   SARS Coronavirus 2 by RT PCR NEGATIVE NEGATIVE Final    Comment: (NOTE) SARS-CoV-2 target nucleic acids are NOT DETECTED. The SARS-CoV-2 RNA is generally detectable in upper respiratoy specimens during the acute phase  of infection. The lowest concentration of SARS-CoV-2 viral copies this assay can detect is 131 copies/mL. A negative result does not preclude SARS-Cov-2 infection and should not be used as the sole basis for treatment or other patient management decisions. A negative result may occur with  improper specimen collection/handling, submission of specimen other than nasopharyngeal swab, presence of viral mutation(s) within the areas targeted by this assay, and inadequate number of viral copies (<131 copies/mL). A negative result must be combined with clinical observations, patient history, and epidemiological information. The expected result is Negative. Fact Sheet for Patients:  PinkCheek.be Fact Sheet for Healthcare Providers:  GravelBags.it This test is not yet ap proved or cleared by the [place] FDA and  has  been authorized for detection and/or diagnosis of SARS-CoV-2 by FDA under an Emergency Use Authorization (EUA). This EUA will remain  in effect (meaning this test can be used) for the duration of the COVID-19 declaration under Section 564(b)(1) of the Act, 21 U.S.C. section 360bbb-3(b)(1), unless the authorization is terminated or revoked sooner.    Influenza A by PCR NEGATIVE NEGATIVE Final   Influenza B by PCR NEGATIVE NEGATIVE Final    Comment: (NOTE) The Xpert Xpress SARS-CoV-2/FLU/RSV assay is intended as an aid in  the diagnosis of influenza from Nasopharyngeal swab specimens and  should not be used as a sole basis for treatment. Nasal washings and  aspirates are unacceptable for Xpert Xpress SARS-CoV-2/FLU/RSV  testing. Fact Sheet for Patients: PinkCheek.be Fact Sheet for Healthcare Providers: GravelBags.it This test is not yet approved or cleared by the [place] FDA and  has been authorized for detection and/or diagnosis of SARS-CoV-2 by  FDA under an Emergency Use Authorization (EUA). This EUA will remain  in effect (meaning this test can be used) for the duration of the  Covid-19 declaration under Section 564(b)(1) of the Act, 21  U.S.C. section 360bbb-3(b)(1), unless the authorization is  terminated or revoked. Performed at [facility], [address] [place], [place] [address]   Expectorated sputum assessment w rflx to resp cult     Status: None   Collection Time: 04/12/19  3:35 PM   Specimen: Expectorated Sputum  Result Value Ref Range Status   Specimen Description EXPECTORATED SPUTUM  Final   Special Requests NONE  Final   Sputum evaluation   Final    THIS SPECIMEN IS ACCEPTABLE FOR SPUTUM CULTURE Performed at [facility], [address] [place], [place] [address]    Report Status 04/12/2019 FINAL  Final  Culture, respiratory     Status: None (Preliminary result)   Collection  Time: 04/12/19  3:35 PM  Result Value Ref Range Status   Specimen Description   Final    EXPECTORATED SPUTUM Performed at [facility], [address], [place], [place] [address]    Special Requests   Final    NONE Reflexed from [id number] Performed at [facility], [address] [place], [place] [address]    Gram Stain   Final    FEW WBC PRESENT, PREDOMINANTLY PMN ABUNDANT GRAM POSITIVE COCCI MODERATE GRAM POSITIVE RODS RARE YEAST    Culture   Final    CULTURE REINCUBATED FOR BETTER GROWTH Performed at [facility], [place] [address], [place],  [address]    Report Status PENDING  Incomplete    Coagulation Studies: No results for input(s): LABPROT, INR in the last 72 hours.  Urinalysis: Recent Labs    04/10/19 2244  COLORURINE YELLOW*  LABSPEC 1.013  PHURINE 5.0  GLUCOSEU NEGATIVE  HGBUR NEGATIVE  BILIRUBINUR NEGATIVE  KETONESUR NEGATIVE  PROTEINUR 30*  NITRITE POSITIVE*  LEUKOCYTESUR LARGE*      Imaging: MR BRAIN WO CONTRAST  Result Date: 04/11/2019 CLINICAL DATA:  Focal neuro deficit, greater than 6 hours. Right-sided weakness. EXAM: MRI HEAD WITHOUT CONTRAST TECHNIQUE: Multiplanar, multiecho pulse sequences of the brain and surrounding structures were obtained without intravenous contrast. COMPARISON:  CT head without contrast 04/09/2019 FINDINGS: Brain: Multifocal areas of acute/subacute nonhemorrhagic infarction are present bilaterally. The largest cortical area involves the precentral and postcentral gyrus on the left, corresponding with right-sided weakness. A radial white matter infarct is present subjacent to the left middle frontal gyrus. Additional small cortical foci are present in the parietal lobe, the left occipital lobe of the right parietoccipital lobe, and the left posterior hippocampus. A 5 mm focus is present at the inferior right cerebellum. A more posterior remote right cerebellar infarct is present. Remote punctate foci  are present in the left cerebellum and left paramedian brainstem. Confluent periventricular white matter changes extend into the internal capsule bilaterally. No hemorrhage or mass lesion is present. Vascular: Flow is present in the major intracranial arteries. Skull and upper cervical spine: The craniocervical junction is normal. Upper cervical spine is within normal limits. Marrow signal is unremarkable. Extensive ossification the inter cerebral falx is again noted. Sinuses/Orbits: Bilateral mastoid effusions are present. No obstructing nasopharyngeal lesion is present. The paranasal sinuses and mastoid air cells are otherwise clear. The left globe has been replaced. Right globe bilateral orbits are otherwise normal. : 1. Multifocal areas of acute/subacute nonhemorrhagic infarction involving the left frontal, parietal, and occipital lobes as described. Involvement of the left precentral gyrus corresponds with the right-sided weakness. 2. Additional punctate foci of acute nonhemorrhagic infarct involving the inferior right cerebellum and left posterior hippocampus. 3. Remote punctate infarcts involving the left paramedian brainstem, left cerebellum, and left paramedian brainstem. 4. Extensive white matter disease likely reflects the sequela of chronic microvascular ischemia. 5. Bilateral mastoid effusions. No obstructing nasopharyngeal lesion is present. Electronically Signed   By: San Morelle M.D.   On: 04/11/2019 14:14   ECHOCARDIOGRAM COMPLETE BUBBLE STUDY  Result Date: 04/13/2019    ECHOCARDIOGRAM REPORT   Patient Name:   KALAYA INFANTINO Date of Exam: 04/12/2019 Medical Rec #:  [id number]        Height:       60.0 in Accession #:    [id number]       Weight:       151.0 lb Date of Birth:  1932-05-28        BSA:          1.656 m Patient Age:    61 years         BP:           170/75 mmHg Patient Gender: F                HR:           63 bpm. Exam Location:  [facility] Procedure: 2D Echo, Cardiac Doppler,  Color Doppler and Saline Contrast Bubble            Study Indications:     I63.9 Stroke  History:         Patient has prior history of Echocardiogram examinations, most                  recent 11/02/2018. Risk Factors:Hypertension, Diabetes and  Dyslipidemia. Stroke. Dyspnea. Chronic kidney disease.  Sonographer:     Wilford Sports Rodgers-Jones Referring Phys:  [place] Diagnosing Phys: Nelva Bush MD IMPRESSIONS  1. Left ventricular ejection fraction, by estimation, is 55 to 60%. The left ventricle has normal function. The left ventricle demonstrates regional wall motion abnormalities (see scoring diagram/findings for description). There is mild left ventricular  hypertrophy. Left ventricular diastolic parameters are consistent with Grade I diastolic dysfunction (impaired relaxation). Elevated left atrial pressure. There is moderate hypokinesis of the left ventricular, basal inferior wall and inferolateral wall.  2. Right ventricular systolic function is normal. The right ventricular size is normal. There is moderately elevated pulmonary artery systolic pressure.  3. Left atrial size was severely dilated.  4. The mitral valve is degenerative. Mild to moderate mitral valve regurgitation.  5. Tricuspid valve regurgitation is mild to moderate.  6. The aortic valve was not well visualized. Aortic valve regurgitation is moderate. Mild to moderate aortic valve sclerosis/calcification is present, without any evidence of aortic stenosis.  7. The inferior vena cava is dilated in size with <50% respiratory variability, suggesting right atrial pressure of 15 mmHg.  8. Agitated saline contrast bubble study was negative, with no evidence of any interatrial shunt. FINDINGS  Left Ventricle: Left ventricular ejection fraction, by estimation, is 55 to 60%. The left ventricle has normal function. The left ventricle demonstrates regional wall motion abnormalities. Moderate hypokinesis of the left  ventricular, basal inferior wall and inferolateral wall. The left ventricular internal cavity size was normal in size. There is mild left ventricular hypertrophy. Left ventricular diastolic parameters are consistent with Grade I diastolic dysfunction (impaired relaxation). Elevated  left atrial pressure. Right Ventricle: The right ventricular size is normal. No increase in right ventricular wall thickness. Right ventricular systolic function is normal. There is moderately elevated pulmonary artery systolic pressure. The tricuspid regurgitant velocity is 3.05 m/s, and with an assumed right atrial pressure of 15 mmHg, the estimated right ventricular systolic pressure is 65.9 mmHg. Left Atrium: Left atrial size was severely dilated. Right Atrium: Right atrial size was normal in size. Pericardium: There is no evidence of pericardial effusion. Mitral Valve: The mitral valve is degenerative in appearance. There is mild thickening of the mitral valve leaflet(s). Moderate mitral annular calcification. Mild to moderate mitral valve regurgitation. Tricuspid Valve: The tricuspid valve is not well visualized. Tricuspid valve regurgitation is mild to moderate. Aortic Valve: The aortic valve was not well visualized. . There is moderate thickening and mild calcification of the aortic valve. Aortic valve regurgitation is moderate. Aortic regurgitation PHT measures 322 msec. Mild to moderate aortic valve sclerosis/calcification is present, without any evidence of aortic stenosis. There is moderate thickening of the aortic valve. There is mild calcification of the aortic valve. Aortic valve mean gradient measures 8.0 mmHg. Aortic valve peak gradient measures 11.8 mmHg. Pulmonic Valve: The pulmonic valve was not well visualized. Pulmonic valve regurgitation is mild. No evidence of pulmonic stenosis. Aorta: The aortic root is normal in size and structure. Pulmonary Artery: The pulmonary artery is not well seen. Venous: The inferior  vena cava is dilated in size with less than 50% respiratory variability, suggesting right atrial pressure of 15 mmHg. IAS/Shunts: The interatrial septum was not well visualized. Agitated saline contrast was given intravenously to evaluate for intracardiac shunting. Agitated saline contrast bubble study was negative, with no evidence of any interatrial shunt.  LEFT VENTRICLE PLAX 2D LVIDd:         3.63 cm Diastology LVIDs:  2.37 cm LV e' lateral:   6.09 cm/s LV PW:         1.12 cm LV E/e' lateral: 14.4 LV IVS:        1.11 cm LV e' medial:    5.11 cm/s                        LV E/e' medial:  17.1  RIGHT VENTRICLE RV Basal diam:  3.83 cm RV S prime:     18.60 cm/s TAPSE (M-mode): 2.2 cm LEFT ATRIUM              Index       RIGHT ATRIUM           Index LA diam:        5.20 cm  3.14 cm/m  RA Area:     14.10 cm LA Vol (A2C):   114.0 ml 68.82 ml/m RA Volume:   39.20 ml  23.66 ml/m LA Vol (A4C):   88.4 ml  53.37 ml/m LA Biplane Vol: 105.0 ml 63.39 ml/m  AORTIC VALVE AV Vmax:           172.00 cm/s AV Vmean:          130.000 cm/s AV VTI:            0.352 m AV Peak Grad:      11.8 mmHg AV Mean Grad:      8.0 mmHg LVOT Vmax:         90.90 cm/s LVOT Vmean:        59.150 cm/s LVOT VTI:          0.184 m LVOT/AV VTI ratio: 0.52 AI PHT:            322 msec  AORTA Ao Root diam: 3.30 cm MITRAL VALVE                TRICUSPID VALVE MV Area (PHT): 2.76 cm     TR Peak grad:   37.2 mmHg MV Decel Time: 275 msec     TR Vmax:        305.00 cm/s MV E velocity: 87.50 cm/s MV A velocity: 114.00 cm/s  SHUNTS MV E/A ratio:  0.77         Systemic VTI: 0.18 m Nelva Bush MD Electronically signed by Nelva Bush MD Signature Date/Time: 04/13/2019/6:51:35 AM    Final      Medications:   . sodium chloride 250 mL (04/13/19 0356)  . cefTRIAXone (ROCEPHIN)  IV 1 g (04/13/19 0928)  . metronidazole 500 mg (04/13/19 1210)   . amLODipine  5 mg Oral Daily  . aspirin EC  81 mg Oral Daily  . atorvastatin  40 mg Oral q1800  .  brimonidine  1 drop Right Eye BID  . carvedilol  25 mg Oral BID WC  . clopidogrel  75 mg Oral Daily  . folic acid  1 mg Oral Daily  . gabapentin  300 mg Oral TID  . heparin  5,000 Units Subcutaneous Q8H  . isosorbide mononitrate  60 mg Oral BID  . latanoprost  1 drop Right Eye QHS  . multivitamin with minerals  1 tablet Oral Daily  . pantoprazole  40 mg Oral Daily  . sodium chloride flush  10-40 mL Intracatheter Q12H  . thiamine  100 mg Oral Daily   Or  . thiamine  100 mg Intravenous Daily   sodium chloride, acetaminophen, hydrALAZINE, morphine injection, nitroGLYCERIN, ondansetron (  ZOFRAN) IV, ondansetron (ZOFRAN) IV, sodium chloride flush  Assessment/ Plan:  86 y.o. female with past medical history of coronary artery disease, left eye blindness, chronic kidney disease stage III, diastolic heart failure, GERD, hyperlipidemia, hypertension, CVA, diabetes mellitus type 2 who presented with chest pain.  1.  Acute kidney injury/chronic kidney disease stage IIIB baseline EGFR 54/diabetes mellitus type 2 with chronic kidney disease.  Suspect acute kidney injury likely related to contrast versus hypotension.  Creatinine continues to trend down slowly.  Creatinine now 2.03 with an EGFR 25.  She will need continued monitoring of her renal function as an outpatient  2.  Anemia chronic kidney disease.  Continue to monitor hemoglobin as an outpatient.  Most recent hemoglobin was 9.1.  3.  Hypertension.  Maintain the patient on amlodipine, carvedilol, and isosorbide mononitrate.   LOS: 5 [name] 3/31/20211:13 PM

## 2019-04-13 NOTE — Progress Notes (Signed)
Physical Therapy Treatment Patient Details Name: [name] MRN: [id number] DOB: 1932/11/09 Today's Date: 04/13/2019    History of Present Illness presented to ER secondary to substernal chest pain, SOB; admitted for management of chest pain with elevated troponin.  s/p cardiac cath 3/26 significant for occluded RCA with collaterals L to R, recommended for medical management.    PT Comments    Pt alert, up in chair, lab at beside. Agreeable to ambulate with PT. SPC trialed, pt demonstrated unsteadiness, and reached for external support, RW utilized for remainder of mobility. Pt transferred to commode, did nearly miss due to not utilizing RW and grab bar safely, educated several times during session about safety and hand placement. Pt ambulated ~17ft RW and CGA. Pt did experience one LOB upon returning the the room with L lateral lean, minA from PT to correct. PT and pt also reviewed proper shoe wear to increase safety. Pt up in chair, tech in room to give pt a bath. Current plan remains appropriate.     Follow Up Recommendations  Home health PT;Supervision/Assistance - 24 hour     Equipment Recommendations  Rolling walker with 5" wheels;3in1 (PT)    Recommendations for Other Services       Precautions / Restrictions Precautions Precautions: Fall Restrictions Weight Bearing Restrictions: No    Mobility  Bed Mobility Overal bed mobility: Needs Assistance             General bed mobility comments: pt up in chair at start of session  Transfers Overall transfer level: Needs assistance Equipment used: Rolling walker (2 wheeled) Transfers: Sit to/from Stand Sit to Stand: Min guard            Ambulation/Gait Ambulation/Gait assistance: Min guard Gait Distance (Feet): 180 Feet Assistive device: Rolling walker (2 wheeled) Gait Pattern/deviations: WFL(Within Functional Limits) Gait velocity: decreased   General Gait Details: functional, pt did not need physical  assist to ambulate, but did experience one LOB with L lateral lean with minA to correct from PT.   Stairs             Wheelchair Mobility    Modified Rankin (Stroke Patients Only)       Balance Overall balance assessment: Needs assistance Sitting-balance support: No upper extremity supported;Feet unsupported Sitting balance-Leahy Scale: Good     Standing balance support: Bilateral upper extremity supported Standing balance-Leahy Scale: Fair                              Cognition Arousal/Alertness: Awake/alert Behavior During Therapy: WFL for tasks assessed/performed Overall Cognitive Status: Within Functional Limits for tasks assessed                                 General Comments: Pt was A and O throughout session. Cooperative and pleasant throughout.      Exercises Other Exercises Other Exercises: Pt ambulated to commode with SPC, switched to RW due to pt reaching for external support and unsteadiness noted. Pt sat on commode without reaching back/use of grab bar, almost missed commode. Educated on safety and hand placement several times during session.    General Comments        Pertinent Vitals/Pain Pain Assessment: No/denies pain    Home Living                      Prior  Function            PT Goals (current goals can now be found in the care plan section) Progress towards PT goals: Progressing toward goals    Frequency    Min 2X/week      PT Plan Current plan remains appropriate    Co-evaluation              AM-PAC PT "6 Clicks" Mobility   Outcome Measure  Help needed turning from your back to your side while in a flat bed without using bedrails?: None Help needed moving from lying on your back to sitting on the side of a flat bed without using bedrails?: A Little Help needed moving to and from a bed to a chair (including a wheelchair)?: A Little Help needed standing up from a chair using your  arms (e.g., wheelchair or bedside chair)?: A Little Help needed to walk in hospital room?: A Little Help needed climbing 3-5 steps with a railing? : A Little 6 Click Score: 19    End of Session Equipment Utilized During Treatment: Gait belt Activity Tolerance: Patient tolerated treatment well Patient left: in chair;with nursing/sitter in room Nurse Communication: Mobility status PT Visit Diagnosis: Muscle weakness (generalized) (M62.81);Difficulty in walking, not elsewhere classified (R26.2)     Time: 3419-3790 PT Time Calculation (min) (ACUTE ONLY): 17 min  Charges:  $Therapeutic Exercise: 8-22 mins                     Lieutenant Diego PT, DPT 11:09 AM,04/13/19

## 2019-04-13 NOTE — Progress Notes (Signed)
OT Cancellation Note  Patient Details Name: [name] MRN: [id number] DOB: Aug 08, 1932   Cancelled Treatment:    Reason Eval/Treat Not Completed: Other (comment)   Upon entry, patient having blood drawn for labs.  Therapist will attempt session again as available and appropriate.  Thank you.  Oren Binet 04/13/2019, 10:13 AM

## 2019-04-13 NOTE — Discharge Summary (Addendum)
Physician Discharge Summary  [name] VCB:[id number] DOB: January 27, 1932 DOA: 04/07/2019  PCP: Dierdre Harness, FNP  Admit date: 04/07/2019 Discharge date: 04/13/2019  Discharge disposition: Home with home health therapy    Recommendations for Outpatient Follow-Up:   Follow-up with PCP in 1 week Follow-up with nephrologist as scheduled Follow-up with cardiologist as scheduled   Discharge Diagnosis:   Principal Problem:   Chest pain Active Problems:   Chronic diastolic heart failure ([place])   Ischemic stroke ([place])   Alcohol abuse   Stage 3 chronic kidney disease due to type 2 diabetes mellitus ([place])   GERD without esophagitis   AKI (acute kidney injury) ([place])   HTN (hypertension)   HLD (hyperlipidemia)   Elevated troponin   Unstable angina (HCC)   Tachycardia   Coronary artery disease involving native coronary artery of native heart without angina pectoris    Discharge Condition: Stable.  Diet recommendation: Low-salt diet  Code status: Full code.    Hospital Course:   [name] a 86 y.o.femalewith medical history significant ofhypertension, hyperlipidemia, diet-controlled diabetes, stroke, GERD, depression, anxiety, left eye blindness, alcohol abuse in remission, CKD stage IIIb,chronic diastolic CHF, who presented  with chest pain and shortness of breath.  Troponins were mildly elevated.  She was admitted to the hospital for unstable angina.  She underwent left heart catheterization which revealed occluded RCA.  Medical management was recommended.  Of note, she had significant atrial tachycardia during the procedure, requiring IV Lopressor.  During her hospitalization, she developed weakness in the right upper and lower extremities.  Further work-up revealed multifocal areas of acute/subacute nonhemorrhagic stroke in the left frontal, parietal and occipital lobes.  She was seen in consultation by the neurologist who recommended dual antiplatelet  therapy with aspirin and Plavix for 3 weeks followed by Plavix monotherapy.  PT and OT recommended home health therapy.  She has underlying CKD stage IIIb.  She developed acute kidney injury following left heart catheterization.  She was seen in consultation by the nephrologist.  She was treated with IV fluids and creatinine has slowly improved.  From nephrology standpoint, patient can be discharged and continue with outpatient monitoring of kidney function.  Overall, her condition has improved and she is deemed stable for discharge to home today.      Discharge Exam:   Vitals:   04/13/19 0800 04/13/19 1116  BP: (!) 153/59 139/60  Pulse: 63 (!) 58  Resp: 17 17  Temp: 98.6 F (37 C) 97.8 F (36.6 C)  SpO2: 97% 99%   Vitals:   04/13/19 0003 04/13/19 0403 04/13/19 0800 04/13/19 1116  BP: (!) 166/62 (!) 154/61 (!) 153/59 139/60  Pulse: 62 64 63 (!) 58  Resp:   17 17  Temp: 98.6 F (37 C) 98 F (36.7 C) 98.6 F (37 C) 97.8 F (36.6 C)  TempSrc: Oral Oral Oral   SpO2: 96% 97% 97% 99%  Weight:  69.3 kg    Height:         GEN: NAD SKIN: No rash EYES: abnormal left eye (blind in left eye) ENT: MMM CV: RRR PULM: CTA B ABD: soft, ND, NT, +BS CNS: AAO x 3, non focal EXT: No edema or tenderness   The results of significant diagnostics from this hospitalization (including imaging, microbiology, ancillary and laboratory) are listed below for reference.     Procedures and Diagnostic Studies:   ECHOCARDIOGRAM COMPLETE BUBBLE STUDY  Result Date: 04/13/2019    ECHOCARDIOGRAM REPORT   Patient  Name:   [name] Date of Exam: 04/12/2019 Medical Rec #:  [id number]        Height:       60.0 in Accession #:    [id number]       Weight:       151.0 lb Date of Birth:  June 21, 1932        BSA:          1.656 m Patient Age:    86 years         BP:           170/75 mmHg Patient Gender: F                HR:           63 bpm. Exam Location:  [facility] Procedure: 2D Echo, Cardiac Doppler, Color  Doppler and Saline Contrast Bubble            Study Indications:     I63.9 Stroke  History:         Patient has prior history of Echocardiogram examinations, most                  recent 11/02/2018. Risk Factors:Hypertension, Diabetes and                  Dyslipidemia. Stroke. Dyspnea. Chronic kidney disease.  Sonographer:     Wilford Sports Rodgers-Jones Referring Phys:  [place] Diagnosing Phys: Nelva Bush MD IMPRESSIONS  1. Left ventricular ejection fraction, by estimation, is 55 to 60%. The left ventricle has normal function. The left ventricle demonstrates regional wall motion abnormalities (see scoring diagram/findings for description). There is mild left ventricular  hypertrophy. Left ventricular diastolic parameters are consistent with Grade I diastolic dysfunction (impaired relaxation). Elevated left atrial pressure. There is moderate hypokinesis of the left ventricular, basal inferior wall and inferolateral wall.  2. Right ventricular systolic function is normal. The right ventricular size is normal. There is moderately elevated pulmonary artery systolic pressure.  3. Left atrial size was severely dilated.  4. The mitral valve is degenerative. Mild to moderate mitral valve regurgitation.  5. Tricuspid valve regurgitation is mild to moderate.  6. The aortic valve was not well visualized. Aortic valve regurgitation is moderate. Mild to moderate aortic valve sclerosis/calcification is present, without any evidence of aortic stenosis.  7. The inferior vena cava is dilated in size with <50% respiratory variability, suggesting right atrial pressure of 15 mmHg.  8. Agitated saline contrast bubble study was negative, with no evidence of any interatrial shunt. FINDINGS  Left Ventricle: Left ventricular ejection fraction, by estimation, is 55 to 60%. The left ventricle has normal function. The left ventricle demonstrates regional wall motion abnormalities. Moderate hypokinesis of the left ventricular,  basal inferior wall and inferolateral wall. The left ventricular internal cavity size was normal in size. There is mild left ventricular hypertrophy. Left ventricular diastolic parameters are consistent with Grade I diastolic dysfunction (impaired relaxation). Elevated  left atrial pressure. Right Ventricle: The right ventricular size is normal. No increase in right ventricular wall thickness. Right ventricular systolic function is normal. There is moderately elevated pulmonary artery systolic pressure. The tricuspid regurgitant velocity is 3.05 m/s, and with an assumed right atrial pressure of 15 mmHg, the estimated right ventricular systolic pressure is 26.7 mmHg. Left Atrium: Left atrial size was severely dilated. Right Atrium: Right atrial size was normal in size. Pericardium: There is no evidence of pericardial effusion. Mitral Valve: The mitral valve is  degenerative in appearance. There is mild thickening of the mitral valve leaflet(s). Moderate mitral annular calcification. Mild to moderate mitral valve regurgitation. Tricuspid Valve: The tricuspid valve is not well visualized. Tricuspid valve regurgitation is mild to moderate. Aortic Valve: The aortic valve was not well visualized. . There is moderate thickening and mild calcification of the aortic valve. Aortic valve regurgitation is moderate. Aortic regurgitation PHT measures 322 msec. Mild to moderate aortic valve sclerosis/calcification is present, without any evidence of aortic stenosis. There is moderate thickening of the aortic valve. There is mild calcification of the aortic valve. Aortic valve mean gradient measures 8.0 mmHg. Aortic valve peak gradient measures 11.8 mmHg. Pulmonic Valve: The pulmonic valve was not well visualized. Pulmonic valve regurgitation is mild. No evidence of pulmonic stenosis. Aorta: The aortic root is normal in size and structure. Pulmonary Artery: The pulmonary artery is not well seen. Venous: The inferior vena cava is  dilated in size with less than 50% respiratory variability, suggesting right atrial pressure of 15 mmHg. IAS/Shunts: The interatrial septum was not well visualized. Agitated saline contrast was given intravenously to evaluate for intracardiac shunting. Agitated saline contrast bubble study was negative, with no evidence of any interatrial shunt.  LEFT VENTRICLE PLAX 2D LVIDd:         3.63 cm Diastology LVIDs:         2.37 cm LV e' lateral:   6.09 cm/s LV PW:         1.12 cm LV E/e' lateral: 14.4 LV IVS:        1.11 cm LV e' medial:    5.11 cm/s                        LV E/e' medial:  17.1  RIGHT VENTRICLE RV Basal diam:  3.83 cm RV S prime:     18.60 cm/s TAPSE (M-mode): 2.2 cm LEFT ATRIUM              Index       RIGHT ATRIUM           Index LA diam:        5.20 cm  3.14 cm/m  RA Area:     14.10 cm LA Vol (A2C):   114.0 ml 68.82 ml/m RA Volume:   39.20 ml  23.66 ml/m LA Vol (A4C):   88.4 ml  53.37 ml/m LA Biplane Vol: 105.0 ml 63.39 ml/m  AORTIC VALVE AV Vmax:           172.00 cm/s AV Vmean:          130.000 cm/s AV VTI:            0.352 m AV Peak Grad:      11.8 mmHg AV Mean Grad:      8.0 mmHg LVOT Vmax:         90.90 cm/s LVOT Vmean:        59.150 cm/s LVOT VTI:          0.184 m LVOT/AV VTI ratio: 0.52 AI PHT:            322 msec  AORTA Ao Root diam: 3.30 cm MITRAL VALVE                TRICUSPID VALVE MV Area (PHT): 2.76 cm     TR Peak grad:   37.2 mmHg MV Decel Time: 275 msec     TR Vmax:  305.00 cm/s MV E velocity: 87.50 cm/s MV A velocity: 114.00 cm/s  SHUNTS MV E/A ratio:  0.77         Systemic VTI: 0.18 m Nelva Bush MD Electronically signed by Nelva Bush MD Signature Date/Time: 04/13/2019/6:51:35 AM    Final      Labs:   Basic Metabolic Panel: Recent Labs  Lab 04/09/19 0457 04/09/19 0457 04/10/19 0810 04/10/19 0810 04/11/19 0459 04/11/19 0459 04/12/19 0718 04/13/19 1021  NA 141  --  137  --  139  --  136 135  K 4.4   < > 4.3   < > 4.1   < > 4.2 4.4  CL 109  --  106   --  109  --  105 107  CO2 23  --  22  --  23  --  22 21*  GLUCOSE 71  --  83  --  92  --  94 137*  BUN 29*  --  34*  --  35*  --  33* 29*  CREATININE 1.91*  --  2.44*  --  2.29*  --  2.20* 2.03*  CALCIUM 8.5*  --  8.7*  --  8.5*  --  8.8* 8.5*  PHOS  --   --   --   --  4.1  --   --   --    < > = values in this interval not displayed.   GFR Estimated Creatinine Clearance: 17.6 mL/min (A) (by C-G formula based on SCr of 2.03 mg/dL (H)). Liver Function Tests: Recent Labs  Lab 04/07/19 1116  AST 47*  ALT 27  ALKPHOS 75  BILITOT 0.7  PROT 7.6  ALBUMIN 3.6   No results for input(s): LIPASE, AMYLASE in the last 168 hours. No results for input(s): AMMONIA in the last 168 hours. Coagulation profile No results for input(s): INR, PROTIME in the last 168 hours.  CBC: Recent Labs  Lab 04/07/19 1116 04/10/19 1733 04/11/19 0459 04/12/19 0718  WBC 7.6 6.9 5.3 5.1  HGB 12.1 10.1* 9.5* 9.1*  HCT 37.2 30.0* 29.2* 28.3*  MCV 91.4 92.3 93.0 93.7  PLT 229 176 195 180   Cardiac Enzymes: No results for input(s): CKTOTAL, CKMB, CKMBINDEX, TROPONINI in the last 168 hours. BNP: Invalid input(s): POCBNP CBG: Recent Labs  Lab 04/08/19 0927 04/08/19 1036 04/08/19 1300 04/09/19 1429  GLUCAP 71 89 80 108*   D-Dimer No results for input(s): DDIMER in the last 72 hours. Hgb A1c No results for input(s): HGBA1C in the last 72 hours. Lipid Profile No results for input(s): CHOL, HDL, LDLCALC, TRIG, CHOLHDL, LDLDIRECT in the last 72 hours. Thyroid function studies No results for input(s): TSH, T4TOTAL, T3FREE, THYROIDAB in the last 72 hours.  Invalid input(s): FREET3 Anemia work up No results for input(s): VITAMINB12, FOLATE, FERRITIN, TIBC, IRON, RETICCTPCT in the last 72 hours. Microbiology Recent Results (from the past 240 hour(s))  Respiratory Panel by RT PCR (Flu A&B, Covid) - Nasopharyngeal Swab     Status: None   Collection Time: 04/07/19 12:54 PM   Specimen: Nasopharyngeal Swab   Result Value Ref Range Status   SARS Coronavirus 2 by RT PCR NEGATIVE NEGATIVE Final    Comment: (NOTE) SARS-CoV-2 target nucleic acids are NOT DETECTED. The SARS-CoV-2 RNA is generally detectable in upper respiratoy specimens during the acute phase of infection. The lowest concentration of SARS-CoV-2 viral copies this assay can detect is 131 copies/mL. A negative result does not preclude SARS-Cov-2 infection and should  not be used as the sole basis for treatment or other patient management decisions. A negative result may occur with  improper specimen collection/handling, submission of specimen other than nasopharyngeal swab, presence of viral mutation(s) within the areas targeted by this assay, and inadequate number of viral copies (<131 copies/mL). A negative result must be combined with clinical observations, patient history, and epidemiological information. The expected result is Negative. Fact Sheet for Patients:  PinkCheek.be Fact Sheet for Healthcare Providers:  GravelBags.it This test is not yet ap proved or cleared by the [place] FDA and  has been authorized for detection and/or diagnosis of SARS-CoV-2 by FDA under an Emergency Use Authorization (EUA). This EUA will remain  in effect (meaning this test can be used) for the duration of the COVID-19 declaration under Section 564(b)(1) of the Act, 21 U.S.C. section 360bbb-3(b)(1), unless the authorization is terminated or revoked sooner.    Influenza A by PCR NEGATIVE NEGATIVE Final   Influenza B by PCR NEGATIVE NEGATIVE Final    Comment: (NOTE) The Xpert Xpress SARS-CoV-2/FLU/RSV assay is intended as an aid in  the diagnosis of influenza from Nasopharyngeal swab specimens and  should not be used as a sole basis for treatment. Nasal washings and  aspirates are unacceptable for Xpert Xpress SARS-CoV-2/FLU/RSV  testing. Fact Sheet for  Patients: PinkCheek.be Fact Sheet for Healthcare Providers: GravelBags.it This test is not yet approved or cleared by the [place] FDA and  has been authorized for detection and/or diagnosis of SARS-CoV-2 by  FDA under an Emergency Use Authorization (EUA). This EUA will remain  in effect (meaning this test can be used) for the duration of the  Covid-19 declaration under Section 564(b)(1) of the Act, 21  U.S.C. section 360bbb-3(b)(1), unless the authorization is  terminated or revoked. Performed at [facility], [address] [place], [place] [address]   Expectorated sputum assessment w rflx to resp cult     Status: None   Collection Time: 04/12/19  3:35 PM   Specimen: Expectorated Sputum  Result Value Ref Range Status   Specimen Description EXPECTORATED SPUTUM  Final   Special Requests NONE  Final   Sputum evaluation   Final    THIS SPECIMEN IS ACCEPTABLE FOR SPUTUM CULTURE Performed at [facility], [address] [place], [place] [address]    Report Status 04/12/2019 FINAL  Final  Culture, respiratory     Status: None (Preliminary result)   Collection Time: 04/12/19  3:35 PM  Result Value Ref Range Status   Specimen Description   Final    EXPECTORATED SPUTUM Performed at [facility], [address], [place], [place] [address]    Special Requests   Final    NONE Reflexed from [id number] Performed at [facility], [address] [place], [place] [address]    Gram Stain   Final    FEW WBC PRESENT, PREDOMINANTLY PMN ABUNDANT GRAM POSITIVE COCCI MODERATE GRAM POSITIVE RODS RARE YEAST    Culture   Final    CULTURE REINCUBATED FOR BETTER GROWTH Performed at [facility], [place] [address] [place], [place] [address]    Report Status PENDING  Incomplete     Discharge Instructions:   Discharge Instructions    Diet - low sodium heart healthy   Complete by: As  directed    Discharge instructions   Complete by: As directed    Stop taking Aspirin after dose on 05/02/2019. Avoid alcohol   Face-to-face encounter (required for  Medicare/Medicaid patients)   Complete by: As directed    I Dabney Schanz certify that this patient is under my care and that I, or a nurse practitioner or physician's assistant working with me, had a face-to-face encounter that meets the physician face-to-face encounter requirements with this patient on 04/13/2019. The encounter with the patient was in whole, or in part for the following medical condition(s) which is the primary reason for home health care (List medical condition): Stroke, debility   The encounter with the patient was in whole, or in part, for the following medical condition, which is the primary reason for home health care: Stroke, debility   I certify that, based on my findings, the following services are medically necessary home health services: Physical therapy   Reason for Medically Necessary Home Health Services: Therapy- Personnel officer, Public librarian   My clinical findings support the need for the above services: Unable to leave home safely without assistance and/or assistive device   Further, I certify that my clinical findings support that this patient is homebound due to: Unable to leave home safely without assistance   For home use only DME Walker rolling   Complete by: As directed    Walker: With [place]   Patient needs a walker to treat with the following condition: [place]   Complete by: As directed    To provide the following care/treatments:  PT OT     Increase activity slowly   Complete by: As directed      Allergies as of 04/13/2019   No Known Allergies     Medication List    TAKE these medications   acetaminophen 325 MG tablet Commonly known as: TYLENOL Take 2 tablets (650 mg total) by mouth every 6 (six) hours as needed for mild pain or fever  (or temp > 37.5 C (99.5 F)).   Alphagan P 0.1 % Soln Generic drug: brimonidine Place 1 drop into the right eye 2 (two) times daily.   amLODipine 5 MG tablet Commonly known as: NORVASC Take 1 tablet (5 mg total) by mouth daily. Start taking on: April 14, 2019   aspirin 81 MG tablet Take 1 tablet (81 mg total) by mouth daily for 19 days.   atorvastatin 40 MG tablet Commonly known as: LIPITOR Take 1 tablet (40 mg total) by mouth daily at 6 PM.   carvedilol 12.5 MG tablet Commonly known as: COREG Take 12.5 mg by mouth 2 (two) times daily with a meal.   clopidogrel 75 MG tablet Commonly known as: PLAVIX Take 1 tablet (75 mg total) by mouth daily. Start taking on: April 14, 7406   folic acid 1 MG tablet Commonly known as: FOLVITE Take 1 tablet (1 mg total) by mouth daily.   gabapentin 300 MG capsule Commonly known as: NEURONTIN Take 300 mg by mouth 3 (three) times daily.   hydrALAZINE 10 MG tablet Commonly known as: APRESOLINE Take 1 tablet (10 mg total) by mouth 3 (three) times daily.   isosorbide mononitrate 60 MG 24 hr tablet Commonly known as: IMDUR Take 1 tablet (60 mg total) by mouth 2 (two) times daily.   multivitamin with minerals Tabs tablet Take 1 tablet by mouth daily.   nitroGLYCERIN 0.4 MG SL tablet Commonly known as: NITROSTAT Place 1 tablet (0.4 mg total) under the tongue every 5 (five) minutes as needed for chest pain.   omeprazole 20 MG capsule Commonly known as: PRILOSEC Take 1 capsule (  20 mg total) by mouth daily.   Travatan Z 0.004 % Soln ophthalmic solution Generic drug: Travoprost (BAK Free) Place 1 drop into the right eye at bedtime.            Durable Medical Equipment  (From admission, onward)         Start     Ordered   04/13/19 0000  For home use only DME Walker rolling    Question Answer Comment  Walker: With [place]   Patient needs a walker to treat with the following condition Debility      04/13/19 1416   04/10/19  0801  For home use only DME Walker rolling  Once    Question Answer Comment  Walker: With [place]   Patient needs a walker to treat with the following condition Weakness      04/10/19 0801   04/10/19 0801  For home use only DME Eelevated commode seat  Once     04/10/19 0801         Follow-up Information    [facility]   Specialty: Home Health Services Why: Physical Therapy, Occupational Therapy Contact information: [address] [place] [address] [phone]        Dierdre Harness, FNP.   Specialty: Family Medicine Contact information: [place] [address] [phone]        Anthonette Legato, MD.   Specialty: Nephrology Contact information: [place] [address] [phone]        Belva Crome, MD. Go on 05/13/2019.   Specialty: Cardiology Why: Appointment at 10:15am Contact information: [address] [place] [address] [phone]            Time coordinating discharge: 34 minutes   Signed:  Jennye Boroughs  [facility] 04/13/2019, 9:25 PM

## 2019-04-15 LAB — CULTURE, RESPIRATORY W GRAM STAIN: Culture: NORMAL

## 2019-04-19 ENCOUNTER — Telehealth: Payer: Self-pay | Admitting: Interventional Cardiology

## 2019-04-19 NOTE — Telephone Encounter (Signed)
New Message  Pt's physical therapist named Laurey Arrow with [facility] called and stated she needed immediate verbal orders from Dr. Tamala Julian. The orders are:  1 time a week for 1 week 2 times a week for 4 weeks 1 time a week for 4 weeks  She also requested a Education officer, museum for the pt for long term planning.  Please give her a call at [phone]

## 2019-04-19 NOTE — Telephone Encounter (Signed)
Left Laurey Arrow with [facility] [phone] message to call back about PT orders from Dr Tamala Julian. 4/6

## 2019-04-20 ENCOUNTER — Telehealth: Payer: Self-pay | Admitting: Interventional Cardiology

## 2019-04-20 NOTE — Telephone Encounter (Signed)
New message:    [name] from [place] would like for a nurse to call her back. Please call patient.

## 2019-04-20 NOTE — Telephone Encounter (Signed)
See previous phone note in regards to this.

## 2019-04-20 NOTE — Telephone Encounter (Signed)
Spoke with Dr. Tamala Julian this morning and he said this would need to go to PCP.

## 2019-04-20 NOTE — Telephone Encounter (Signed)
Spoke with [name] at [facility] and let her know that Dr. Tamala Julian said orders would need to go to PCP.  [name] states pt currently does not have a PCP.  Was last seen by PCP in July 2020 and Provider has since left the practice.  [name] wanted to know if Dr. Tamala Julian was willing to sign orders for PT and ok for their social worker to come out until pt get find a new PCP?  At that time, they would then flip the orders over to the new Primary Care.

## 2019-04-21 NOTE — Telephone Encounter (Signed)
Left detailed message on Maria's confirmed VM about Dr. Thompson Caul message below.  Advised to call back if any questions.

## 2019-04-21 NOTE — Telephone Encounter (Signed)
We will be happy to sign as a temporizing measure until he PCP is identified.  We would do this as the initial signee.

## 2019-05-03 ENCOUNTER — Telehealth: Payer: Self-pay | Admitting: Interventional Cardiology

## 2019-05-03 NOTE — Telephone Encounter (Signed)
New Message:     Roswell Miners from [facility] called. She wanted Dr Tamala Julian to know pt did not have her PT on Friday, she had another medical appointment.

## 2019-05-12 DIAGNOSIS — I69359 Hemiplegia and hemiparesis following cerebral infarction affecting unspecified side: Secondary | ICD-10-CM | POA: Insufficient documentation

## 2019-05-12 NOTE — Progress Notes (Deleted)
Cardiology Office Note:    Date:  05/12/2019   ID:  [name], DOB 18-Jul-1932, MRN [id number]  PCP:  Dierdre Harness, FNP  Cardiologist:  Sinclair Grooms, MD *** Electrophysiologist:  None   Referring MD: Dierdre Harness, FNP   Chief Complaint:  No chief complaint on file.    Patient Profile:    [name] is a 86 y.o. female with:   Chronic Diastolic CHF  Echocardiogram 03/2019: EF 55-60, Gr 1 DD, RVSP 52.2, mild-mod MR, mild-mod TR, mod AI  Coronary artery disease w/ chronic angina  Cath 03/2019: RCA 100 (CTO) w L-R collats  ? Myoview 5/19: small inf basal ischemia, low risk >> Med Rx.  Hx of CVA  Hypertension   Hyperlipidemia   Diabetes mellitus 2 (diet)  Chronic kidney disease   Tongue CA s/p radiation in [place] in 1990s  Tobacco use  GERD  Prior CV studies: *** Echocardiogram w/ Bubble Study 04/12/2019 EF 55-60, mild LVH, Gr 1 DD, inf and inf-lat HK, normal RVSF, mod elevated PASP (RVSP 52.2), severe LAE, mild to mod MR, mild to mod TR, mod AI, bubble study negative  Carotid US 04/13/2019 No sig ICA stenosis  Cardiac catheterization 04/08/2019 LAD mid 30 LCx prox 30 RCA ost 100 (CTO), L-R collats  Echocardiogram 11/02/2018 EF 60-65, mild LVH, grade 1 diastolic dysfunction (impaired relaxation), moderate LAE, moderate MAC, trace MR, mild TR, moderate aortic valve sclerosis without stenosis  Event monitor 07/08/2017  NSR  PAC's  No atrial fibrillation, tachycardia, or pauses  Echocardiogram 06/03/2017 Moderate concentric LVH, EF 65-70, normal wall motion, grade 1 diastolic dysfunction, mild aortic stenosis, mild aortic insufficiency, MAC, mild LAE, mild TR  Myoview 03/01/2014 Low risk stress nuclear study. There is evidence of small inferolateral scar with minimal peri-infarct ischemia. SDS=4, new since 2008. Normal LV systolic function.  LV Ejection Fraction: 53%   History of Present Illness:    [name] was admitted in  10/2018 with chest pain which was managed with Furosemide due to possible volume excess.  Her Creatinine increased as an OP and her Furosemide was DC'd.  A Myoview was not done due to chronic kidney disease.  She is high risk for CIN and cardiac catheterization was being avoided.  She was last seen in clinic by Kayla Kicks, NP in 01/2019.  She was admitted to [facility] 3/25-3/31 with unstable angina.  She was seen by Cardiology and underwent cardiac catheterization which demonstrated a chronically occluded RCA and no significant disease elsewhere.  She had episodes of SVT around her procedure and this was felt to likely be atrial tachycardia by EP (Dr. Rayann Heman).  Her beta-blocker was adjusted.  She developed R sided hemiparesis post cath and was noted to have multiple small areas of acute/subacute infarction on MRI felt to represent cardioembolic source related to her cardiac catheterization.  Bubble study was neg.  Echocardiogram showed normal EF with mild to mod MR and mod AI.  Neurology recommended dual antiplatelet Rx.  Her creatinine worsened post cath.  She was followed by Nephrology.  Her creatinine improved with gentle hydration.   The DICTATELATER SmartLink is not supported in this context. ***   Past Medical History:  Diagnosis Date  . Anemia 04/03/2011  . Anginal pain ([place])    a. 02/2014 MV: Low risk; b. 05/2017 MV: small area of inferior basal ischemia. EF 54% - low risk-->Med mgmt.  Marland Kitchen Anxiety   . Aortic valve sclerosis w/ systolic murmur  a. 10/2018 AoV sclerosis w/o stenosis.  . Arthritis    "feet" (06/02/2017)  . Blind left eye 1999  . CKD (chronic kidney disease) stage 3, GFR 30-59 ml/min   . Depression   . Diastolic dysfunction    a. 10/2018 Echo: EF 60-65%, mild septal/posterior LVH. Impaired relaxation. Nl RV size/fxn. Mod dil LA. Trace TR. Triv AI. Mod AoV sclerosis w/o stenosis. Nl PASP.  Marland Kitchen Exertional dyspnea   . GERD (gastroesophageal reflux disease)     . High cholesterol   . Hip fracture ([place]) 07/17/11   fall from 1-2 feet; left  . Hypertension   . Ischemic stroke ([place]) 05/2017   a. 06/2017 Holter: RSR, PACs, no afib.  . Prosthetic eye globe 1999   "from diabetic; left eye"  . Type II diabetes mellitus ([place])    not taking any medication (06/02/2017)    Current Medications: No outpatient medications have been marked as taking for the 05/13/19 encounter (Appointment) with [name] T, PA-C.     Allergies:   Patient has no known allergies.   Social History   Tobacco Use  . Smoking status: Former Smoker    Packs/day: 1.50    Years: 47.00    Pack years: 70.50    Types: Cigarettes    Quit date: 09/13/1997    Years since quitting: 21.6  . Smokeless tobacco: Never Used  Substance Use Topics  . Alcohol use: Not Currently    Alcohol/week: 17.0 standard drinks    Types: 17 Shots of liquor per week    Comment: Quit 03/2018  . Drug use: No    Types: Marijuana, Heroin    Comment: 06/02/2017  "last drug use was in the 1980s." Prev injected heroin.     Family Hx: The patient's family history includes Diabetes type II in her father, mother, and sister. There is no history of Colon cancer.  ROS   EKGs/Labs/Other Test Reviewed:    EKG:  EKG is *** ordered today.  The ekg ordered today demonstrates ***  Recent Labs: 11/10/2018: NT-Pro BNP 924 04/07/2019: ALT 27; B Natriuretic Peptide 1,028.0 04/12/2019: Hemoglobin 9.1; Platelets 180 04/13/2019: BUN 29; Creatinine, Ser 2.03; Potassium 4.4; Sodium 135   Recent Lipid Panel Lab Results  Component Value Date/Time   CHOL 169 04/08/2019 04:37 AM   TRIG 154 (H) 04/08/2019 04:37 AM   HDL 47 04/08/2019 04:37 AM   CHOLHDL 3.6 04/08/2019 04:37 AM   LDLCALC 91 04/08/2019 04:37 AM    Physical Exam:    VS:  There were no vitals taken for this visit.    Wt Readings from Last 3 Encounters:  04/13/19 152 lb 12.8 oz (69.3 kg)  02/11/19 146 lb 12.8 oz (66.6 kg)  01/26/19 144 lb (65.3 kg)      Physical Exam ***  ASSESSMENT & PLAN:    *** 1. Angina pectoris ([place]) She has fairly stable exertional angina.  Given her abnormal Myoview in 2019, presumed she has coronary artery disease.  She is on max dose amlodipine and beta-blocker with carvedilol.  I will increase her isosorbide to 90 mg daily.  Consider adding ranolazine if she has inadequate response to isosorbide.  She has follow-up in 2 weeks already planned.  Keep that appointment to reassess her response to antianginal therapy.  I will further maximize her therapy for coronary artery disease.  Increase atorvastatin to 40 mg daily.  Continue aspirin.  Prescription given for as needed nitroglycerin.  If she fails medical therapy or has  worsening symptoms, we may need to consider cardiac catheterization.  2. Chronic diastolic CHF (congestive heart failure) (HCC) Volume status appears stable off diuretic therapy.  3. CKD (chronic kidney disease) stage 4, GFR 15-29 ml/min (HCC) Furosemide was stopped after last visit.  Repeat BMET today.  4. Essential hypertension Blood pressure above target.  Adjust medications as outlined above.  5. Hyperlipidemia, unspecified hyperlipidemia type Increase atorvastatin to 40 mg daily.  Arrange lipids and LFTs in 3 months.  Dispo:  No follow-ups on file.   Medication Adjustments/Labs and Tests Ordered: Current medicines are reviewed at length with the patient today.  Concerns regarding medicines are outlined above.  Tests Ordered: No orders of the defined types were placed in this encounter.  Medication Changes: No orders of the defined types were placed in this encounter.   Signed, [name], PA-C  05/12/2019 9:48 PM    [facility] [address], [place], [place]  [address] Phone: [phone]; Fax: [phone]

## 2019-05-13 ENCOUNTER — Ambulatory Visit: Payer: Medicare Other | Admitting: Physician Assistant

## 2019-05-26 ENCOUNTER — Telehealth: Payer: Self-pay | Admitting: Interventional Cardiology

## 2019-05-26 NOTE — Telephone Encounter (Signed)
New essage:      She needs an order for 1 additional OT visit next week.

## 2019-05-26 NOTE — Telephone Encounter (Signed)
Yes

## 2019-05-26 NOTE — Telephone Encounter (Signed)
Left a message on [name]'s confidential VM letting her know that Dr. Tamala Julian said it was ok for pt to have on additional visit.  Advised to call back if any questions.

## 2019-05-26 NOTE — Telephone Encounter (Signed)
Malori, OT with [facility], calling to see if Dr. Tamala Julian would agree to her going out for one more visit with pt.  Pt still has not established with a new PCP.  Advised I would send to Dr. Tamala Julian to see if agreeable.

## 2019-05-31 ENCOUNTER — Telehealth: Payer: Self-pay | Admitting: Interventional Cardiology

## 2019-05-31 NOTE — Telephone Encounter (Signed)
Keri, with [facility], is calling to get verbal orders from Dr. Tamala Julian to continue visits with patient. She states that when she went out there for medication management the patients BP was 168/96. She states she had the patient take her hydralazine and it did go down some before she left. But, she states that the patient was out of her amlodipine and plavix. She had that refilled for the patient but she is unsure if the patient was keeping up with her medications. She feels like she needs to continue the visits for follow up. She is aware that Anderson Malta and Dr. Tamala Julian will not be back until next week.

## 2019-05-31 NOTE — Telephone Encounter (Signed)
This should be handled by her PCP.

## 2019-05-31 NOTE — Telephone Encounter (Signed)
Attempted return phone call to [facility] with [facility] and left voicemail re: orders for continued Home Health.  Advised per Dr Tamala Julian, pt's PCP should issue orders.

## 2019-06-06 ENCOUNTER — Observation Stay (HOSPITAL_COMMUNITY)
Admission: EM | Admit: 2019-06-06 | Discharge: 2019-06-08 | Disposition: A | Discharge status: Home / Self Care | Payer: Medicare Other | Attending: Family Medicine | Admitting: Family Medicine

## 2019-06-06 ENCOUNTER — Encounter (HOSPITAL_COMMUNITY): Payer: Self-pay | Admitting: *Deleted

## 2019-06-06 ENCOUNTER — Emergency Department (HOSPITAL_COMMUNITY): Payer: Medicare Other

## 2019-06-06 ENCOUNTER — Telehealth: Payer: Self-pay | Admitting: Interventional Cardiology

## 2019-06-06 DIAGNOSIS — M1990 Unspecified osteoarthritis, unspecified site: Secondary | ICD-10-CM | POA: Insufficient documentation

## 2019-06-06 DIAGNOSIS — Z87891 Personal history of nicotine dependence: Secondary | ICD-10-CM | POA: Diagnosis not present

## 2019-06-06 DIAGNOSIS — I130 Hypertensive heart and chronic kidney disease with heart failure and stage 1 through stage 4 chronic kidney disease, or unspecified chronic kidney disease: Secondary | ICD-10-CM | POA: Insufficient documentation

## 2019-06-06 DIAGNOSIS — H5462 Unqualified visual loss, left eye, normal vision right eye: Secondary | ICD-10-CM | POA: Diagnosis not present

## 2019-06-06 DIAGNOSIS — H42 Glaucoma in diseases classified elsewhere: Secondary | ICD-10-CM | POA: Diagnosis present

## 2019-06-06 DIAGNOSIS — Z970 Presence of artificial eye: Secondary | ICD-10-CM | POA: Insufficient documentation

## 2019-06-06 DIAGNOSIS — Z20822 Contact with and (suspected) exposure to covid-19: Secondary | ICD-10-CM | POA: Insufficient documentation

## 2019-06-06 DIAGNOSIS — E7800 Pure hypercholesterolemia, unspecified: Secondary | ICD-10-CM | POA: Diagnosis not present

## 2019-06-06 DIAGNOSIS — E1169 Type 2 diabetes mellitus with other specified complication: Secondary | ICD-10-CM | POA: Diagnosis not present

## 2019-06-06 DIAGNOSIS — E785 Hyperlipidemia, unspecified: Secondary | ICD-10-CM | POA: Diagnosis not present

## 2019-06-06 DIAGNOSIS — R519 Headache, unspecified: Secondary | ICD-10-CM | POA: Diagnosis not present

## 2019-06-06 DIAGNOSIS — I25118 Atherosclerotic heart disease of native coronary artery with other forms of angina pectoris: Secondary | ICD-10-CM | POA: Insufficient documentation

## 2019-06-06 DIAGNOSIS — I160 Hypertensive urgency: Secondary | ICD-10-CM | POA: Diagnosis not present

## 2019-06-06 DIAGNOSIS — I69398 Other sequelae of cerebral infarction: Secondary | ICD-10-CM | POA: Insufficient documentation

## 2019-06-06 DIAGNOSIS — E1122 Type 2 diabetes mellitus with diabetic chronic kidney disease: Secondary | ICD-10-CM | POA: Diagnosis not present

## 2019-06-06 DIAGNOSIS — E1142 Type 2 diabetes mellitus with diabetic polyneuropathy: Secondary | ICD-10-CM | POA: Insufficient documentation

## 2019-06-06 DIAGNOSIS — Z66 Do not resuscitate: Secondary | ICD-10-CM | POA: Insufficient documentation

## 2019-06-06 DIAGNOSIS — Z7902 Long term (current) use of antithrombotics/antiplatelets: Secondary | ICD-10-CM | POA: Diagnosis not present

## 2019-06-06 DIAGNOSIS — I5032 Chronic diastolic (congestive) heart failure: Secondary | ICD-10-CM | POA: Insufficient documentation

## 2019-06-06 DIAGNOSIS — I2510 Atherosclerotic heart disease of native coronary artery without angina pectoris: Secondary | ICD-10-CM | POA: Diagnosis present

## 2019-06-06 DIAGNOSIS — I10 Essential (primary) hypertension: Secondary | ICD-10-CM | POA: Diagnosis present

## 2019-06-06 DIAGNOSIS — K219 Gastro-esophageal reflux disease without esophagitis: Secondary | ICD-10-CM | POA: Insufficient documentation

## 2019-06-06 DIAGNOSIS — Z79899 Other long term (current) drug therapy: Secondary | ICD-10-CM | POA: Insufficient documentation

## 2019-06-06 DIAGNOSIS — E1159 Type 2 diabetes mellitus with other circulatory complications: Secondary | ICD-10-CM | POA: Diagnosis not present

## 2019-06-06 DIAGNOSIS — E1139 Type 2 diabetes mellitus with other diabetic ophthalmic complication: Secondary | ICD-10-CM | POA: Diagnosis not present

## 2019-06-06 DIAGNOSIS — I252 Old myocardial infarction: Secondary | ICD-10-CM | POA: Insufficient documentation

## 2019-06-06 DIAGNOSIS — N183 Chronic kidney disease, stage 3 unspecified: Secondary | ICD-10-CM | POA: Diagnosis not present

## 2019-06-06 LAB — CBC
HCT: 37 % (ref 36.0–46.0)
Hemoglobin: 11.5 g/dL — ABNORMAL LOW (ref 12.0–15.0)
MCH: 29.7 pg (ref 26.0–34.0)
MCHC: 31.1 g/dL (ref 30.0–36.0)
MCV: 95.6 fL (ref 80.0–100.0)
Platelets: 213 10*3/uL (ref 150–400)
RBC: 3.87 MIL/uL (ref 3.87–5.11)
RDW: 13.7 % (ref 11.5–15.5)
WBC: 6.2 10*3/uL (ref 4.0–10.5)
nRBC: 0 % (ref 0.0–0.2)

## 2019-06-06 LAB — BASIC METABOLIC PANEL
Anion gap: 8 (ref 5–15)
BUN: 20 mg/dL (ref 8–23)
CO2: 27 mmol/L (ref 22–32)
Calcium: 9 mg/dL (ref 8.9–10.3)
Chloride: 102 mmol/L (ref 98–111)
Creatinine, Ser: 1.26 mg/dL — ABNORMAL HIGH (ref 0.44–1.00)
GFR calc Af Amer: 45 mL/min — ABNORMAL LOW (ref >60)
GFR calc non Af Amer: 39 mL/min — ABNORMAL LOW (ref >60)
Glucose, Bld: 104 mg/dL — ABNORMAL HIGH (ref 70–99)
Potassium: 4.2 mmol/L (ref 3.5–5.1)
Sodium: 137 mmol/L (ref 135–145)

## 2019-06-06 LAB — URINALYSIS, ROUTINE W REFLEX MICROSCOPIC
Bilirubin Urine: NEGATIVE
Glucose, UA: NEGATIVE mg/dL
Hgb urine dipstick: NEGATIVE
Ketones, ur: NEGATIVE mg/dL
Leukocytes,Ua: NEGATIVE
Nitrite: NEGATIVE
Protein, ur: NEGATIVE mg/dL
Specific Gravity, Urine: 1.004 — ABNORMAL LOW (ref 1.005–1.030)
pH: 7 (ref 5.0–8.0)

## 2019-06-06 MED ORDER — HYDRALAZINE HCL 10 MG PO TABS
10.0000 mg | ORAL_TABLET | Freq: Once | ORAL | Status: AC
  Administered 2019-06-06: 10 mg via ORAL
  Filled 2019-06-06: qty 1

## 2019-06-06 MED ORDER — CARVEDILOL 12.5 MG PO TABS: 12.5000 mg | ORAL_TABLET | Freq: Two times a day (BID) | ORAL | Status: DC

## 2019-06-06 MED ORDER — ACETAMINOPHEN 325 MG PO TABS
650.0000 mg | ORAL_TABLET | Freq: Once | ORAL | Status: AC
  Administered 2019-06-06: 650 mg via ORAL
  Filled 2019-06-06: qty 2

## 2019-06-06 MED ORDER — AMLODIPINE BESYLATE 5 MG PO TABS
5.0000 mg | ORAL_TABLET | Freq: Once | ORAL | Status: AC
  Administered 2019-06-06: 5 mg via ORAL
  Filled 2019-06-06: qty 1

## 2019-06-06 MED ORDER — SODIUM CHLORIDE 0.9% FLUSH: 3.0000 mL | Freq: Once | INTRAVENOUS | Status: DC

## 2019-06-06 NOTE — ED Triage Notes (Signed)
To ED for eval of HTN. Pt states she has been out of some of her HTN meds for unknown amount of time. No HA. No CP. No SOB. Nausea. No vomiting. States she is urinating ok. Alert and oriented.

## 2019-06-06 NOTE — ED Provider Notes (Signed)
[facility] EMERGENCY DEPARTMENT Provider Note   CSN: [id number] Arrival date & time: 06/06/19  1255     History Chief Complaint  Patient presents with  . Hypertension    [name] is a 86 y.o. female.  The history is provided by the patient and medical records. No language interpreter was used.  Hypertension   [name] is a 86 y.o. female who presents to the Emergency Department complaining of hypertension. She presents the emergency department for elevated blood pressure. She states that she has a home health nurse the checks on her every other day. When her blood pressure was checked today it was noted to be elevated. She states that is been elevated many times when it's been checked at home but today she agreed to come in for evaluation. She states that she had a stroke about two months ago and since that time has experienced difficulty with recurrent headaches. She denies any chest pain, shortness of breath, vision changes, nausea, vomiting, new numbness or weakness. She is out of her blood pressure medications and has not had these medications for about two months. She lives at home alone and walks with a cane. She denies any tobacco use. She does drink alcohol on the weekends at times but has not had any alcohol in the last two months. She is blind in her left eye at baseline.    Past Medical History:  Diagnosis Date  . Anemia 04/03/2011  . Anginal pain ([place])    a. 02/2014 MV: Low risk; b. 05/2017 MV: small area of inferior basal ischemia. EF 54% - low risk-->Med mgmt.  Marland Kitchen Anxiety   . Aortic valve sclerosis w/ systolic murmur    a. 08/6576 AoV sclerosis w/o stenosis.  . Arthritis    "feet" (06/02/2017)  . Blind left eye 1999  . CKD (chronic kidney disease) stage 3, GFR 30-59 ml/min   . Depression   . Diastolic dysfunction    a. 10/2018 Echo: EF 60-65%, mild septal/posterior LVH. Impaired relaxation. Nl RV size/fxn. Mod dil LA. Trace TR. Triv AI.  Mod AoV sclerosis w/o stenosis. Nl PASP.  Marland Kitchen Exertional dyspnea   . GERD (gastroesophageal reflux disease)   . High cholesterol   . Hip fracture ([place]) 07/17/11   fall from 1-2 feet; left  . Hypertension   . Ischemic stroke ([place]) 05/2017   a. 06/2017 Holter: RSR, PACs, no afib.  . Prosthetic eye globe 1999   "from diabetic; left eye"  . Type II diabetes mellitus ([place])    not taking any medication (06/02/2017)    Patient Active Problem List   Diagnosis Date Noted  . Hemiparesis due to old stroke ([facility]) 05/12/2019  . Tachycardia   . Coronary artery disease involving native coronary artery of native heart without angina pectoris   . Unstable angina ([place]) 04/08/2019  . HTN (hypertension) 04/07/2019  . HLD (hyperlipidemia) 04/07/2019  . Elevated troponin 04/07/2019  . AKI (acute kidney injury) ([place]) 11/01/2018  . Leukocytosis 02/22/2018  . Influenza A 02/21/2018  . Wheezy bronchitis 02/21/2018  . Acute wheezy bronchitis 02/21/2018  . Hypertension associated with stage 3 chronic kidney disease due to type 2 diabetes mellitus ([place]) 06/09/2017  . Dyslipidemia associated with type 2 diabetes mellitus ([place]) 06/09/2017  . Stage 3 chronic kidney disease due to type 2 diabetes mellitus ([place]) 06/09/2017  . Peripheral sensory neuropathy due to type 2 diabetes mellitus ([place]) 06/09/2017  . GERD without esophagitis 06/09/2017  . Glaucoma due to  type 2 diabetes mellitus ([place]) 06/09/2017  . Alcohol abuse 06/04/2017  . Ischemic stroke ([place]) 06/02/2017  . Type 2 diabetes mellitus with vascular disease ([place]) 06/02/2017  . Chest pain 06/02/2017  . Closed non-physeal fracture of metatarsal bone of right foot 07/31/2016  . Hypertensive urgency 02/17/2014  . Chronic diastolic heart failure ([place]) 03/15/2013  . Closed left hip fracture ([place]) 07/17/2011    Past Surgical History:  Procedure Laterality Date  . ENUCLEATION Left   . FRACTURE SURGERY    . HIP ARTHROPLASTY  07/18/2011   Procedure: ARTHROPLASTY  BIPOLAR HIP;  Surgeon: Nita Sells, MD;  Location: [facility];  Service: Orthopedics;  Laterality: Left;  . INTRAOCULAR PROSTHESES INSERTION  1999   left  . LEFT HEART CATH AND CORS/GRAFTS ANGIOGRAPHY N/A 04/08/2019   Procedure: LEFT HEART CATH AND CORS/GRAFTS ANGIOGRAPHY;  Surgeon: Minna Merritts, MD;  Location: [facility];  Service: Cardiovascular;  Laterality: N/A;  . [place]   "cancer"  . TUBAL LIGATION  1970's     OB History   No obstetric history on file.     Family History  Problem Relation Age of Onset  . Diabetes type II Mother   . Diabetes type II Father   . Diabetes type II Sister   . Colon cancer Neg Hx     Social History   Tobacco Use  . Smoking status: Former Smoker    Packs/day: 1.50    Years: 47.00    Pack years: 70.50    Types: Cigarettes    Quit date: 09/13/1997    Years since quitting: 21.7  . Smokeless tobacco: Never Used  Substance Use Topics  . Alcohol use: Not Currently    Alcohol/week: 17.0 standard drinks    Types: 17 Shots of liquor per week    Comment: Quit 03/2018  . Drug use: No    Types: Marijuana, Heroin    Comment: 06/02/2017  "last drug use was in the 1980s." Prev injected heroin.    Home Medications Prior to Admission medications   Medication Sig Start Date End Date Taking? Authorizing Provider  acetaminophen (TYLENOL) 325 MG tablet Take 2 tablets (650 mg total) by mouth every 6 (six) hours as needed for mild pain or fever (or temp > 37.5 C (99.5 F)). 06/05/17   Elgergawy, Silver Huguenin, MD  ALPHAGAN P 0.1 % SOLN Place 1 drop into the right eye 2 (two) times daily. 10/10/15   [provider]  amLODipine (NORVASC) 5 MG tablet Take 1 tablet (5 mg total) by mouth daily. 04/14/19   Jennye Boroughs, MD  atorvastatin (LIPITOR) 40 MG tablet Take 1 tablet (40 mg total) by mouth daily at 6 PM. 02/11/19   Isaiah Serge, NP  carvedilol (COREG) 12.5 MG tablet Take 12.5 mg by mouth 2 (two) times daily with a meal.     [provider]  clopidogrel (PLAVIX) 75 MG tablet Take 1 tablet (75 mg total) by mouth daily. 04/14/19 04/13/20  Jennye Boroughs, MD  folic acid (FOLVITE) 1 MG tablet Take 1 tablet (1 mg total) by mouth daily. 06/06/17   Elgergawy, Silver Huguenin, MD  gabapentin (NEURONTIN) 300 MG capsule Take 300 mg by mouth 3 (three) times daily.     [provider]  hydrALAZINE (APRESOLINE) 10 MG tablet Take 1 tablet (10 mg total) by mouth 3 (three) times daily. 04/13/19   Jennye Boroughs, MD  isosorbide mononitrate (IMDUR) 60 MG 24 hr tablet Take 1  tablet (60 mg total) by mouth 2 (two) times daily. 04/13/19   Jennye Boroughs, MD  Multiple Vitamin (MULTIVITAMIN WITH MINERALS) TABS tablet Take 1 tablet by mouth daily. 06/06/17   Elgergawy, Silver Huguenin, MD  nitroGLYCERIN (NITROSTAT) 0.4 MG SL tablet Place 1 tablet (0.4 mg total) under the tongue every 5 (five) minutes as needed for chest pain. 02/11/19 05/12/19  Isaiah Serge, NP  omeprazole (PRILOSEC) 20 MG capsule Take 1 capsule (20 mg total) by mouth daily. 02/11/19   Isaiah Serge, NP  TRAVATAN Z 0.004 % SOLN ophthalmic solution Place 1 drop into the right eye at bedtime.  11/24/12   [provider]    Allergies    Patient has no known allergies.  Review of Systems   Review of Systems  All other systems reviewed and are negative.   Physical Exam Updated Vital Signs BP (!) 199/82 (BP Location: Left Arm)   Pulse 71   Temp 98.2 F (36.8 C) (Oral)   Resp 17   Ht 5' (1.524 m)   Wt 63.5 kg   SpO2 92%   BMI 27.34 kg/m   Physical Exam Vitals and nursing note reviewed.  Constitutional:      Appearance: She is well-developed.  HENT:     Head: Normocephalic and atraumatic.  Cardiovascular:     Rate and Rhythm: Normal rate and regular rhythm.     Heart sounds: No murmur.  Pulmonary:     Effort: Pulmonary effort is normal. No respiratory distress.     Breath sounds: Normal breath sounds.  Abdominal:     Palpations: Abdomen is soft.      Tenderness: There is no abdominal tenderness. There is no guarding or rebound.  Musculoskeletal:        General: No tenderness.  Skin:    General: Skin is warm and dry.  Neurological:     Mental Status: She is alert and oriented to person, place, and time.     Comments: Left eye is artificial. Right eye is reactive to light and tracks in all directions. Visual fields are grossly intact. Five out of five strength in all four extremities with sensation light touch intact in all four extremities. No asymmetry of facial movements.  Psychiatric:        Behavior: Behavior normal.     ED Results / Procedures / Treatments   Labs (all labs ordered are listed, but only abnormal results are displayed) Labs Reviewed  BASIC METABOLIC PANEL - Abnormal; Notable for the following components:      Result Value   Glucose, Bld 104 (*)    Creatinine, Ser 1.26 (*)    GFR calc non Af Amer 39 (*)    GFR calc Af Amer 45 (*)    All other components within normal limits  CBC - Abnormal; Notable for the following components:   Hemoglobin 11.5 (*)    All other components within normal limits  URINALYSIS, ROUTINE W REFLEX MICROSCOPIC - Abnormal; Notable for the following components:   Color, Urine STRAW (*)    Specific Gravity, Urine 1.004 (*)    All other components within normal limits    EKG EKG Interpretation  Date/Time:  Monday Jun 06 2019 13:06:56 EDT Ventricular Rate:  76 PR Interval:  176 QRS Duration: 88 QT Interval:  384 QTC Calculation: 432 R Axis:   -16 Text Interpretation: Normal sinus rhythm Left ventricular hypertrophy with repolarization abnormality ( R in aVL , Cornell product , Romhilt-Estes )  Abnormal ECG Confirmed by Quintella Reichert [id number]) on 06/06/2019 7:56:35 PM   Radiology No results found.  Procedures Procedures (including critical care time)  Medications Ordered in ED Medications  sodium chloride flush (NS) 0.9 % injection 3 mL (has no administration in time range)   hydrALAZINE (APRESOLINE) tablet 10 mg (has no administration in time range)  acetaminophen (TYLENOL) tablet 650 mg (has no administration in time range)  carvedilol (COREG) tablet 12.5 mg (has no administration in time range)    ED Course  I have reviewed the triage vital signs and the nursing notes.  Pertinent labs & imaging results that were available during my care of the patient were reviewed by me and considered in my medical decision making (see chart for details).    MDM Rules/Calculators/A&P                     Patient here for evaluation of elevated blood pressure. On triage arrival patient denied any complaints. On my initial assessment she does complain of chronic headache since being diagnosed with a stroke two months prior. CT head is negative for acute abnormality. She has no new neurologic deficits. On repeat assessment patient states she has increased shortness of breath and discomfort in her chest. She states that this has been an issue intermittently for several weeks. Will check troponin and treat her elevated blood pressure more aggressively in setting of these new symptoms. Patient care transferred pending recheck after medications and troponin results.    Final Clinical Impression(s) / ED Diagnoses Final diagnoses:  None    Rx / DC Orders ED Discharge Orders    None       Quintella Reichert, MD 06/07/19 [phone]

## 2019-06-06 NOTE — ED Notes (Addendum)
IV nurse consult ordered , unable to access peripheral IV due to poorly visible/minute veins .

## 2019-06-06 NOTE — Telephone Encounter (Signed)
Spoke with April from [facility] who called in because the patients BP was 211/90 on patients monitor. April rechecked the BP herself which was 210/90. After sitting for about 20 minutes, April rechecked BP again and it was 200/90. She states that the patient does not have any of her cardiac medications in the home at this time. The patient thinks that her granddaughter is going to pick them up from the pharmacy but she is unsure of when she will be getting them. The only BP med that the patient has in the home od her Carvedilol which she has taken this morning but reports missing a "few doses over the weekend". I advised April to call EMS and stay with the patient until they arrived if possible. She verbalized understanding and is agreeable to the plan.

## 2019-06-06 NOTE — Telephone Encounter (Signed)
New Message  Pt c/o BP issue:  1. What are your last 5 BP readings? 200/90 2. Are you having any other symptoms (ex. Dizziness, headache, blurred vision, passed out)? No symptoms 3. What is your medication issue? April with [facility] is calling because patient does not have any of her blood pressure medication aside from cardivelol with her at home. They are not sure should the patient go to the hospital or not. Please call.

## 2019-06-07 ENCOUNTER — Other Ambulatory Visit: Payer: Self-pay

## 2019-06-07 ENCOUNTER — Emergency Department (HOSPITAL_COMMUNITY): Payer: Medicare Other

## 2019-06-07 ENCOUNTER — Encounter (HOSPITAL_COMMUNITY): Payer: Self-pay | Admitting: Internal Medicine

## 2019-06-07 DIAGNOSIS — I2510 Atherosclerotic heart disease of native coronary artery without angina pectoris: Secondary | ICD-10-CM | POA: Diagnosis not present

## 2019-06-07 DIAGNOSIS — I160 Hypertensive urgency: Secondary | ICD-10-CM | POA: Diagnosis not present

## 2019-06-07 DIAGNOSIS — E1159 Type 2 diabetes mellitus with other circulatory complications: Secondary | ICD-10-CM

## 2019-06-07 DIAGNOSIS — E7800 Pure hypercholesterolemia, unspecified: Secondary | ICD-10-CM

## 2019-06-07 DIAGNOSIS — I5032 Chronic diastolic (congestive) heart failure: Secondary | ICD-10-CM | POA: Diagnosis not present

## 2019-06-07 DIAGNOSIS — E1139 Type 2 diabetes mellitus with other diabetic ophthalmic complication: Secondary | ICD-10-CM

## 2019-06-07 DIAGNOSIS — H42 Glaucoma in diseases classified elsewhere: Secondary | ICD-10-CM

## 2019-06-07 DIAGNOSIS — K219 Gastro-esophageal reflux disease without esophagitis: Secondary | ICD-10-CM | POA: Diagnosis not present

## 2019-06-07 DIAGNOSIS — E1122 Type 2 diabetes mellitus with diabetic chronic kidney disease: Secondary | ICD-10-CM

## 2019-06-07 DIAGNOSIS — I10 Essential (primary) hypertension: Secondary | ICD-10-CM

## 2019-06-07 DIAGNOSIS — N183 Chronic kidney disease, stage 3 unspecified: Secondary | ICD-10-CM

## 2019-06-07 LAB — CBC
HCT: 34.8 % — ABNORMAL LOW (ref 36.0–46.0)
Hemoglobin: 11 g/dL — ABNORMAL LOW (ref 12.0–15.0)
MCH: 29.4 pg (ref 26.0–34.0)
MCHC: 31.6 g/dL (ref 30.0–36.0)
MCV: 93 fL (ref 80.0–100.0)
Platelets: 226 10*3/uL (ref 150–400)
RBC: 3.74 MIL/uL — ABNORMAL LOW (ref 3.87–5.11)
RDW: 13.6 % (ref 11.5–15.5)
WBC: 6.1 10*3/uL (ref 4.0–10.5)
nRBC: 0 % (ref 0.0–0.2)

## 2019-06-07 LAB — BASIC METABOLIC PANEL
Anion gap: 11 (ref 5–15)
BUN: 16 mg/dL (ref 8–23)
CO2: 28 mmol/L (ref 22–32)
Calcium: 9.2 mg/dL (ref 8.9–10.3)
Chloride: 99 mmol/L (ref 98–111)
Creatinine, Ser: 1.29 mg/dL — ABNORMAL HIGH (ref 0.44–1.00)
GFR calc Af Amer: 44 mL/min — ABNORMAL LOW (ref >60)
GFR calc non Af Amer: 38 mL/min — ABNORMAL LOW (ref >60)
Glucose, Bld: 81 mg/dL (ref 70–99)
Potassium: 3.6 mmol/L (ref 3.5–5.1)
Sodium: 138 mmol/L (ref 135–145)

## 2019-06-07 LAB — CBG MONITORING, ED
Glucose-Capillary: 75 mg/dL (ref 70–99)
Glucose-Capillary: 94 mg/dL (ref 70–99)

## 2019-06-07 LAB — GLUCOSE, CAPILLARY
Glucose-Capillary: 111 mg/dL — ABNORMAL HIGH (ref 70–99)
Glucose-Capillary: 92 mg/dL (ref 70–99)

## 2019-06-07 LAB — SARS CORONAVIRUS 2 BY RT PCR (HOSPITAL ORDER, PERFORMED IN ~~LOC~~ HOSPITAL LAB): SARS Coronavirus 2: NEGATIVE

## 2019-06-07 LAB — TROPONIN I (HIGH SENSITIVITY): Troponin I (High Sensitivity): 16 ng/L (ref <18)

## 2019-06-07 MED ORDER — ADULT MULTIVITAMIN W/MINERALS CH
1.0000 | ORAL_TABLET | Freq: Every day | ORAL | Status: DC
  Administered 2019-06-07 – 2019-06-08 (×2): 1 via ORAL
  Filled 2019-06-07 (×2): qty 1

## 2019-06-07 MED ORDER — GABAPENTIN 300 MG PO CAPS
300.0000 mg | ORAL_CAPSULE | Freq: Three times a day (TID) | ORAL | Status: DC
  Administered 2019-06-07 – 2019-06-08 (×4): 300 mg via ORAL
  Filled 2019-06-07 (×4): qty 1

## 2019-06-07 MED ORDER — FOLIC ACID 1 MG PO TABS
1.0000 mg | ORAL_TABLET | Freq: Every day | ORAL | Status: DC
  Administered 2019-06-07 – 2019-06-08 (×2): 1 mg via ORAL
  Filled 2019-06-07 (×2): qty 1

## 2019-06-07 MED ORDER — SODIUM CHLORIDE 0.9% FLUSH: 10.0000 mL | INTRAVENOUS | Status: DC | PRN

## 2019-06-07 MED ORDER — AMLODIPINE BESYLATE 5 MG PO TABS
5.0000 mg | ORAL_TABLET | Freq: Once | ORAL | Status: AC
  Administered 2019-06-07: 5 mg via ORAL
  Filled 2019-06-07: qty 1

## 2019-06-07 MED ORDER — CLOPIDOGREL BISULFATE 75 MG PO TABS
75.0000 mg | ORAL_TABLET | Freq: Every day | ORAL | Status: DC
  Administered 2019-06-07 – 2019-06-08 (×2): 75 mg via ORAL
  Filled 2019-06-07 (×2): qty 1

## 2019-06-07 MED ORDER — LABETALOL HCL 5 MG/ML IV SOLN
10.0000 mg | Freq: Once | INTRAVENOUS | Status: AC
  Administered 2019-06-07: 10 mg via INTRAVENOUS
  Filled 2019-06-07: qty 4

## 2019-06-07 MED ORDER — ALBUTEROL SULFATE (2.5 MG/3ML) 0.083% IN NEBU: 2.5000 mg | INHALATION_SOLUTION | RESPIRATORY_TRACT | Status: DC | PRN

## 2019-06-07 MED ORDER — SODIUM CHLORIDE 0.9% FLUSH
10.0000 mL | Freq: Two times a day (BID) | INTRAVENOUS | Status: DC
  Administered 2019-06-07 – 2019-06-08 (×3): 10 mL

## 2019-06-07 MED ORDER — ENOXAPARIN SODIUM 40 MG/0.4ML ~~LOC~~ SOLN
40.0000 mg | SUBCUTANEOUS | Status: DC
  Administered 2019-06-07: 40 mg via SUBCUTANEOUS
  Filled 2019-06-07: qty 0.4

## 2019-06-07 MED ORDER — ONDANSETRON HCL 4 MG PO TABS: 4.0000 mg | ORAL_TABLET | Freq: Four times a day (QID) | ORAL | Status: DC | PRN

## 2019-06-07 MED ORDER — POLYETHYLENE GLYCOL 3350 17 G PO PACK: 17.0000 g | PACK | Freq: Every day | ORAL | Status: DC | PRN

## 2019-06-07 MED ORDER — ACETAMINOPHEN 650 MG RE SUPP: 650.0000 mg | Freq: Four times a day (QID) | RECTAL | Status: DC | PRN

## 2019-06-07 MED ORDER — ATORVASTATIN CALCIUM 40 MG PO TABS
40.0000 mg | ORAL_TABLET | Freq: Every day | ORAL | Status: DC
  Administered 2019-06-07: 40 mg via ORAL
  Filled 2019-06-07: qty 1

## 2019-06-07 MED ORDER — HYDRALAZINE HCL 20 MG/ML IJ SOLN: 10.0000 mg | Freq: Four times a day (QID) | INTRAMUSCULAR | Status: DC | PRN

## 2019-06-07 MED ORDER — ENOXAPARIN SODIUM 30 MG/0.3ML ~~LOC~~ SOLN
30.0000 mg | SUBCUTANEOUS | Status: DC
  Administered 2019-06-08: 30 mg via SUBCUTANEOUS
  Filled 2019-06-07: qty 0.3

## 2019-06-07 MED ORDER — HYDRALAZINE HCL 25 MG PO TABS
25.0000 mg | ORAL_TABLET | Freq: Three times a day (TID) | ORAL | Status: DC
  Administered 2019-06-07 – 2019-06-08 (×3): 25 mg via ORAL
  Filled 2019-06-07 (×4): qty 1

## 2019-06-07 MED ORDER — LATANOPROST 0.005 % OP SOLN
1.0000 [drp] | Freq: Every day | OPHTHALMIC | Status: DC
  Administered 2019-06-07: 1 [drp] via OPHTHALMIC
  Filled 2019-06-07: qty 2.5

## 2019-06-07 MED ORDER — CARVEDILOL 12.5 MG PO TABS
25.0000 mg | ORAL_TABLET | Freq: Once | ORAL | Status: AC
  Administered 2019-06-07: 25 mg via ORAL
  Filled 2019-06-07: qty 2

## 2019-06-07 MED ORDER — ASPIRIN 81 MG PO CHEW
324.0000 mg | CHEWABLE_TABLET | Freq: Once | ORAL | Status: AC
  Administered 2019-06-07: 324 mg via ORAL
  Filled 2019-06-07: qty 4

## 2019-06-07 MED ORDER — ISOSORBIDE MONONITRATE ER 60 MG PO TB24
60.0000 mg | ORAL_TABLET | Freq: Two times a day (BID) | ORAL | Status: DC
  Administered 2019-06-07 – 2019-06-08 (×3): 60 mg via ORAL
  Filled 2019-06-07: qty 1
  Filled 2019-06-07: qty 2
  Filled 2019-06-07: qty 1

## 2019-06-07 MED ORDER — ONDANSETRON HCL 4 MG/2ML IJ SOLN: 4.0000 mg | Freq: Four times a day (QID) | INTRAMUSCULAR | Status: DC | PRN

## 2019-06-07 MED ORDER — PANTOPRAZOLE SODIUM 40 MG PO TBEC
40.0000 mg | DELAYED_RELEASE_TABLET | Freq: Every day | ORAL | Status: DC
  Administered 2019-06-07 – 2019-06-08 (×2): 40 mg via ORAL
  Filled 2019-06-07 (×2): qty 1

## 2019-06-07 MED ORDER — CARVEDILOL 12.5 MG PO TABS
12.5000 mg | ORAL_TABLET | Freq: Two times a day (BID) | ORAL | Status: DC
  Administered 2019-06-07 – 2019-06-08 (×3): 12.5 mg via ORAL
  Filled 2019-06-07 (×3): qty 1

## 2019-06-07 MED ORDER — FUROSEMIDE 40 MG PO TABS
40.0000 mg | ORAL_TABLET | Freq: Every day | ORAL | Status: DC
  Administered 2019-06-07 – 2019-06-08 (×2): 40 mg via ORAL
  Filled 2019-06-07: qty 1
  Filled 2019-06-07: qty 2

## 2019-06-07 MED ORDER — INSULIN ASPART 100 UNIT/ML ~~LOC~~ SOLN
0.0000 [IU] | Freq: Three times a day (TID) | SUBCUTANEOUS | Status: DC
  Administered 2019-06-08: 3 [IU] via SUBCUTANEOUS

## 2019-06-07 MED ORDER — LABETALOL HCL 5 MG/ML IV SOLN: 10.0000 mg | Freq: Once | INTRAVENOUS | Status: DC

## 2019-06-07 MED ORDER — BRIMONIDINE TARTRATE 0.15 % OP SOLN
1.0000 [drp] | Freq: Two times a day (BID) | OPHTHALMIC | Status: DC
  Administered 2019-06-07 – 2019-06-08 (×2): 1 [drp] via OPHTHALMIC
  Filled 2019-06-07: qty 5

## 2019-06-07 MED ORDER — ACETAMINOPHEN 325 MG PO TABS
650.0000 mg | ORAL_TABLET | Freq: Four times a day (QID) | ORAL | Status: DC | PRN
  Administered 2019-06-08: 650 mg via ORAL
  Filled 2019-06-07: qty 2

## 2019-06-07 MED ORDER — INSULIN ASPART 100 UNIT/ML ~~LOC~~ SOLN: 0.0000 [IU] | Freq: Every day | SUBCUTANEOUS | Status: DC

## 2019-06-07 MED ORDER — AMLODIPINE BESYLATE 10 MG PO TABS
10.0000 mg | ORAL_TABLET | Freq: Every day | ORAL | Status: DC
  Administered 2019-06-07 – 2019-06-08 (×2): 10 mg via ORAL
  Filled 2019-06-07: qty 2
  Filled 2019-06-07: qty 1

## 2019-06-07 NOTE — ED Notes (Signed)
Tele  Breakfast Ordered 

## 2019-06-07 NOTE — ED Notes (Signed)
Lunch Tray Ordered @ 1041. 

## 2019-06-07 NOTE — H&P (Signed)
History and Physical    [name] UMP:[id number] DOB: December 02, 1932 DOA: 06/06/2019  PCP: Dierdre Harness, FNP  Patient coming from: Home  I have personally briefly reviewed patient's old medical records in [place]  Chief Complaint:  Poorly controlled blood pressure   HPI: [name] is a 86 y.o. female with medical history significant of hypertension, hyperlipidemia, diabetes mellitus type 2 diet-controlled, single vessel obstructive coronary artery disease with chronic anginal symptoms, history of stroke, gastroesophageal reflux disease, anxiety/depression, left eye blindness, right eye glaucoma, chronic kidney disease stage III, congestive heart failure with preserved ejection fraction who was sent to the ED by visiting nurse due to poorly controlled blood pressure.  Patient stated that she has been having elevated blood pressure for about a month.  She ran out of some of her medications, unable to say which ones.  She reported ongoing chest pressure throughout this time, intermittent, at rest and with exertion.  She reports as well associated headaches, as well as occasional dyspnea.  In the ED patient had evidence of hypertensive urgency with poorly controlled blood pressures, in the range 200s over 100s, despite administration of as needed antihypertensive medications.  Due to ongoing symptoms of chest pressure and headache, she was referred to the hospitalist team for admission to the hospital.  In the ED patient was afebrile temperature 97.5 F. Blood pressure was elevated at 233/88, with most recent 180/122, otherwise without tachycardia. Patient was not hypoxic or tachypneic. CBC showed no leukocytosis or anemia, hemoglobin 11.5.  Chemistry shows elevation of creatinine 1.2, at the baseline, without other significant abnormalities.  First troponin negative at 16.  EKG showed normal sinus rhythm with LVH, with no acute ST-T changes.  Patient tested negative for  coronavirus 2019 Chest x-ray showed cardiomegaly, otherwise no acute findings. CT head showed no acute intracranial abnormalities. Patient was treated with oral amlodipine, carvedilol, hydralazine, as well as intravenous labetalol, hydralazine, but her blood pressure remained poorly controlled. Patient will be admitted to hospital for further evaluation and treatment   Review of Systems  Constitutional: Negative for chills, fever and malaise/fatigue.  HENT: Negative.   Eyes:       Left eye blindness  Respiratory: Positive for shortness of breath. Negative for cough and wheezing.   Cardiovascular: Positive for chest pain. Negative for orthopnea and leg swelling.  Gastrointestinal: Negative for abdominal pain, heartburn, nausea and vomiting.  Genitourinary: Negative for dysuria and frequency.  Musculoskeletal: Negative for myalgias.  Skin: Negative.   Neurological: Positive for headaches. Negative for dizziness, sensory change, speech change and focal weakness.  Psychiatric/Behavioral: Negative for depression. The patient is not nervous/anxious.    As per HPI otherwise all other systems reviewed and are negative.   Past Medical History:  Diagnosis Date  . Anemia 04/03/2011  . Anginal pain ([place])    a. 02/2014 MV: Low risk; b. 05/2017 MV: small area of inferior basal ischemia. EF 54% - low risk-->Med mgmt.  Marland Kitchen Anxiety   . Aortic valve sclerosis w/ systolic murmur    a. 40/0867 AoV sclerosis w/o stenosis.  . Arthritis    "feet" (06/02/2017)  . Blind left eye 1999  . CKD (chronic kidney disease) stage 3, GFR 30-59 ml/min   . Depression   . Diastolic dysfunction    a. 10/2018 Echo: EF 60-65%, mild septal/posterior LVH. Impaired relaxation. Nl RV size/fxn. Mod dil LA. Trace TR. Triv AI. Mod AoV sclerosis w/o stenosis. Nl PASP.  Marland Kitchen Exertional dyspnea   . GERD (  gastroesophageal reflux disease)   . High cholesterol   . Hip fracture ([place]) 07/17/11   fall from 1-2 feet; left  . Hypertension    . Ischemic stroke ([place]) 05/2017   a. 06/2017 Holter: RSR, PACs, no afib.  . Prosthetic eye globe 1999   "from diabetic; left eye"  . Type II diabetes mellitus ([place])    not taking any medication (06/02/2017)    Past Surgical History:  Procedure Laterality Date  . ENUCLEATION Left   . FRACTURE SURGERY    . HIP ARTHROPLASTY  07/18/2011   Procedure: ARTHROPLASTY BIPOLAR HIP;  Surgeon: Nita Sells, MD;  Location: [facility];  Service: Orthopedics;  Laterality: Left;  . INTRAOCULAR PROSTHESES INSERTION  1999   left  . LEFT HEART CATH AND CORS/GRAFTS ANGIOGRAPHY N/A 04/08/2019   Procedure: LEFT HEART CATH AND CORS/GRAFTS ANGIOGRAPHY;  Surgeon: Minna Merritts, MD;  Location: [facility];  Service: Cardiovascular;  Laterality: N/A;  . [place]   "cancer"  . TUBAL LIGATION  1970's    Social History  reports that she quit smoking about 21 years ago. Her smoking use included cigarettes. She has a 70.50 pack-year smoking history. She has never used smokeless tobacco. She reports previous alcohol use of about 17.0 standard drinks of alcohol per week. She reports that she does not use drugs.  No Known Allergies  Family History  Problem Relation Age of Onset  . Diabetes type II Mother   . Diabetes type II Father   . Diabetes type II Sister   . Colon cancer Neg Hx    Prior to Admission medications   Medication Sig Start Date End Date Taking? Authorizing Provider  acetaminophen (TYLENOL) 325 MG tablet Take 2 tablets (650 mg total) by mouth every 6 (six) hours as needed for mild pain or fever (or temp > 37.5 C (99.5 F)). 06/05/17   Elgergawy, Silver Huguenin, MD  ALPHAGAN P 0.1 % SOLN Place 1 drop into the right eye 2 (two) times daily. 10/10/15   [provider]  amLODipine (NORVASC) 5 MG tablet Take 1 tablet (5 mg total) by mouth daily. 04/14/19   Jennye Boroughs, MD  atorvastatin (LIPITOR) 40 MG tablet Take 1 tablet (40 mg total) by mouth daily at 6 PM. 02/11/19    Isaiah Serge, NP  carvedilol (COREG) 12.5 MG tablet Take 12.5 mg by mouth 2 (two) times daily with a meal.    [provider]  clopidogrel (PLAVIX) 75 MG tablet Take 1 tablet (75 mg total) by mouth daily. 04/14/19 04/13/20  Jennye Boroughs, MD  folic acid (FOLVITE) 1 MG tablet Take 1 tablet (1 mg total) by mouth daily. 06/06/17   Elgergawy, Silver Huguenin, MD  furosemide (LASIX) 40 MG tablet Take 40 mg by mouth daily. 05/14/19   [provider]  gabapentin (NEURONTIN) 300 MG capsule Take 300 mg by mouth 3 (three) times daily.     [provider]  hydrALAZINE (APRESOLINE) 10 MG tablet Take 1 tablet (10 mg total) by mouth 3 (three) times daily. 04/13/19   Jennye Boroughs, MD  isosorbide mononitrate (IMDUR) 60 MG 24 hr tablet Take 1 tablet (60 mg total) by mouth 2 (two) times daily. 04/13/19   Jennye Boroughs, MD  Multiple Vitamin (MULTIVITAMIN WITH MINERALS) TABS tablet Take 1 tablet by mouth daily. 06/06/17   Elgergawy, Silver Huguenin, MD  nitroGLYCERIN (NITROSTAT) 0.4 MG SL tablet Place 1 tablet (0.4 mg total) under the tongue every 5 (  five) minutes as needed for chest pain. 02/11/19 05/12/19  Isaiah Serge, NP  omeprazole (PRILOSEC) 20 MG capsule Take 1 capsule (20 mg total) by mouth daily. 02/11/19   Isaiah Serge, NP  TRAVATAN Z 0.004 % SOLN ophthalmic solution Place 1 drop into the right eye at bedtime.  11/24/12   [provider]    Physical Exam: Vitals:   06/07/19 0030 06/07/19 0117 06/07/19 0130 06/07/19 0233  BP: (!) 222/86 (!) 218/84 (!) 178/88 (!) 180/122  Pulse: 74 75 66 68  Resp: 16 16 20 12   Temp:    (!) 97.5 F (36.4 C)  TempSrc:    Oral  SpO2:  99%  98%  Weight:      Height:        Constitutional: NAD, calm, comfortable Vitals:   06/07/19 0030 06/07/19 0117 06/07/19 0130 06/07/19 0233  BP: (!) 222/86 (!) 218/84 (!) 178/88 (!) 180/122  Pulse: 74 75 66 68  Resp: 16 16 20 12   Temp:    (!) 97.5 F (36.4 C)  TempSrc:    Oral  SpO2:  99%  98%  Weight:       Height:       Eyes: PERRL, lids and conjunctivae normal ENMT: Mucous membranes are moist. Posterior pharynx clear of any exudate or lesions.Normal dentition.  Neck: normal, supple, no masses, no thyromegaly Respiratory: clear to auscultation bilaterally, no wheezing, no crackles. Normal respiratory effort. No accessory muscle use.  Cardiovascular: Regular rate and rhythm, no murmurs / rubs / gallops. No extremity edema. 2+ pedal pulses. No carotid bruits.  Abdomen: no tenderness, no masses palpated. No hepatosplenomegaly. Bowel sounds positive.  Musculoskeletal: no clubbing / cyanosis. No joint deformity upper and lower extremities. Good ROM, no contractures. Normal muscle tone.  Skin: no rashes, lesions, ulcers. No induration Neurologic: CN 2-12 grossly intact. Sensation intact, DTR normal. Strength 5/5 in all 4.  Psychiatric: Normal judgment and insight. Alert and oriented x 3. Normal mood.   Labs on Admission: I have personally reviewed following labs and imaging studies  CBC: Recent Labs  Lab 06/06/19 1332  WBC 6.2  HGB 11.5*  HCT 37.0  MCV 95.6  PLT 403    Basic Metabolic Panel: Recent Labs  Lab 06/06/19 1332  NA 137  K 4.2  CL 102  CO2 27  GLUCOSE 104*  BUN 20  CREATININE 1.26*  CALCIUM 9.0    GFR: Estimated Creatinine Clearance: 27.2 mL/min (A) (by C-G formula based on SCr of 1.26 mg/dL (H)).  Liver Function Tests: No results for input(s): AST, ALT, ALKPHOS, BILITOT, PROT, ALBUMIN in the last 168 hours.  Urine analysis:    Component Value Date/Time   COLORURINE STRAW (A) 06/06/2019 1351   APPEARANCEUR CLEAR 06/06/2019 1351   LABSPEC 1.004 (L) 06/06/2019 1351   PHURINE 7.0 06/06/2019 1351   GLUCOSEU NEGATIVE 06/06/2019 1351   HGBUR NEGATIVE 06/06/2019 1351   BILIRUBINUR NEGATIVE 06/06/2019 1351   KETONESUR NEGATIVE 06/06/2019 1351   PROTEINUR NEGATIVE 06/06/2019 1351   UROBILINOGEN 0.2 02/07/2014 2241   NITRITE NEGATIVE 06/06/2019 1351    LEUKOCYTESUR NEGATIVE 06/06/2019 1351   Radiological Exams on Admission: CT Head Wo Contrast  Result Date: 06/06/2019 CLINICAL DATA:  Headache, hypertension EXAM: CT HEAD WITHOUT CONTRAST TECHNIQUE: Contiguous axial images were obtained from the base of the skull through the vertex without intravenous contrast. COMPARISON:  04/11/2019, 04/09/2019 FINDINGS: Brain: No signs of acute infarct or hemorrhage. There are scattered chronic ischemic changes throughout the  periventricular white matter, bilateral basal ganglia, and left frontal cortex. Diffuse cerebral atrophy. Lateral ventricles are unremarkable. No acute extra-axial fluid collections. No mass effect. Vascular: No hyperdense vessel or unexpected calcification. Skull: Normal. Negative for fracture or focal lesion. Sinuses/Orbits: No acute finding. Other: None. IMPRESSION: 1. Chronic ischemic changes as above. No acute intracranial process. Electronically Signed   By: Randa Ngo M.D.   On: 06/06/2019 22:27   DG Chest Port 1 View  Result Date: 06/07/2019 CLINICAL DATA:  Chest tightness. Hypertension. Patient reports she has been out of hypertension medications. EXAM: PORTABLE CHEST 1 VIEW COMPARISON:  04/11/2019 FINDINGS: Cardiomegaly, unchanged.The cardiomediastinal contours are unchanged. Atherosclerosis of the aortic arch. Pulmonary vasculature is normal. No consolidation, pleural effusion, or pneumothorax. No acute osseous abnormalities are seen. IMPRESSION: Stable cardiomegaly. No acute abnormality. Aortic Atherosclerosis (ICD10-I70.0). Electronically Signed   By: Keith Rake M.D.   On: 06/07/2019 01:26   EKG: Independently reviewed. Showed normal sinus rhythm with LVH, no acute ST-T changes  Assessment/Plan Principal Problem:   Hypertensive urgency Active Problems:   Chronic diastolic heart failure (HCC)   Type 2 diabetes mellitus with vascular disease (HCC)   Stage 3 chronic kidney disease due to type 2 diabetes mellitus  (HCC)   GERD without esophagitis   Glaucoma due to type 2 diabetes mellitus (HCC)   HTN (hypertension)   HLD (hyperlipidemia)   Coronary artery disease involving native coronary artery of native heart without angina pectoris  Hypertensive urgency Suspect the patient is noncompliant with her regimen of medications. It is unclear what she takes and what she is not. Continue carvedilol 12.5 mg twice daily, furosemide 40mg  daily and Imdur 60 mg twice daily Increase hydralazine 25 mg 3 times daily, increase amlodipine 10 mg daily. Continue monitor blood pressure.  Adjust as needed.   May use hydralazine IV as needed for SBP more than 180 and DBP more than 110  Coronary artery disease with stable angina Chronic congestive heart failure with preserved ejection fraction (EF 55-60% in March 2021) Patient reports ongoing chest tightness.  No clear evidence of volume overload or signs of heart failure.  First troponin without significant elevation, trend.  EKG without visible acute ischemic changes. Patient had cardiac catheterization March 2021, which revealed single-vessel coronary artery disease affecting RCA with visible collaterals.  Medical therapy recommended. Continue Plavix, atorvastatin, carvedilol, amlodipine, hydralazine and Imdur and furosemide, adjust as needed.   Chronic kidney disease stage III Renal function is stable, creatinine 1.2.  It appears the patient recovered from recent acute kidney injury.  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 Observation status is judged to be reasonable and necessary in order to provide the required intensity of service to ensure the patient's safety. The patient's presenting symptoms, physical exam findings, and initial radiographic and laboratory data in the context of their medical condition is felt to  place them at decreased risk for further clinical deterioration. Furthermore, it is anticipated that the patient will be medically stable for discharge from the hospital within 2 midnights of admission. The following factors support the patient status of observation.   " The patient's presenting symptoms include: Chest pain, headache: Poorly controlled blood pressure " The physical exam findings include: No acute findings " The initial radiographic and laboratory data are: Essentially within normal limits  Allie Dimmer MD [facility]  How to contact the [facility] Attending or Consulting provider [place] or covering provider during after hours [place], for this patient?   1. Check the care team in [facility] and look for a) attending/consulting [facility] provider listed and b) the [facility] team listed 2. Log into [email or web address] and use [place]'s universal password to access. If you do not have the password, please contact the hospital operator. 3. Locate the [facility] provider you are looking for under [facility] and page to a number that you can be directly reached. 4. If you still have difficulty reaching the provider, please page the [facility] (Director on Call) for the Hospitalists listed on amion for assistance.  06/07/2019, 2:43 AM

## 2019-06-07 NOTE — ED Notes (Signed)
EDP attempted to insert peripheral IV using ultrasound , unable to access at this time , IV nurse paged by secretary.

## 2019-06-07 NOTE — Plan of Care (Signed)
Patient is a 86 year old female with medical history of hypertension, hyperlipidemia, diabetes mellitus type 2 diet-controlled, single-vessel obstructive coronary artery disease with chronic anginal symptoms, history of stroke, GERD, right eye glaucoma, chronic kidney disease stage III, CHF with preserved ejection fraction who was admitted early this morning for uncontrolled blood pressure, sent to the ED by the visiting nurse.  In the ED patient was found to have hypertensive urgency with systolic blood pressure in the range of 200s despite administration of antihypertensive medications.  She also complained of chest pressure and headache.  Initial troponin negative.  EKG showed normal sinus rhythm with LVH, no acute ST-T changes.  Patient seen and examined.  At the time of my examination she denies having any chest pain.  Chest x-ray showed cardiomegaly without any other acute findings.  She was started on her home medications.  We will continue current medication.  Continue to monitor.  Patient says she lives by herself and has a visiting nurse that checks on her.  There is also a question of noncompliance?  May benefit from home health.  Will consult TOC.

## 2019-06-07 NOTE — Progress Notes (Signed)
Patient arrived to unit in NAD, VS stable and free from pain. Patient oriented to room.

## 2019-06-07 NOTE — ED Provider Notes (Signed)
  Patient signed out pending chest x-ray and initial troponin.  Initially patient evaluated for what was thought to be asymptomatic hypertension.  Noted to have increasing blood pressures by home health nurse.  She is not taking her home medications.  Blood pressures 200s over 100s.  Patient subsequently endorsed headache ongoing for some time and chest pain.  Her EKG had no evidence of acute ischemia.  Additional labs obtained including troponin and chest x-ray.  Patient is high risk.  She was admitted for an NSTEMI in March.  Patient was given her home blood pressure medications as well as labetalol.  Blood pressure improved to 178/88.  No active chest pain.  Initial troponin is 16.  Patient is high risk and likely having some hypertensive urgency.  Will admit for chest pain rule out and blood pressure control.  Physical Exam  BP (!) 178/88   Pulse 66   Temp 97.7 F (36.5 C) (Oral)   Resp 20   Ht 1.524 m (5')   Wt 63.5 kg   SpO2 99%   BMI 27.34 kg/m      Merryl Hacker, MD 06/07/19 [phone]

## 2019-06-08 DIAGNOSIS — I160 Hypertensive urgency: Secondary | ICD-10-CM | POA: Diagnosis not present

## 2019-06-08 LAB — BASIC METABOLIC PANEL
Anion gap: 10 (ref 5–15)
BUN: 19 mg/dL (ref 8–23)
CO2: 27 mmol/L (ref 22–32)
Calcium: 9.1 mg/dL (ref 8.9–10.3)
Chloride: 97 mmol/L — ABNORMAL LOW (ref 98–111)
Creatinine, Ser: 1.46 mg/dL — ABNORMAL HIGH (ref 0.44–1.00)
GFR calc Af Amer: 38 mL/min — ABNORMAL LOW (ref >60)
GFR calc non Af Amer: 32 mL/min — ABNORMAL LOW (ref >60)
Glucose, Bld: 87 mg/dL (ref 70–99)
Potassium: 4 mmol/L (ref 3.5–5.1)
Sodium: 134 mmol/L — ABNORMAL LOW (ref 135–145)

## 2019-06-08 LAB — CBC
HCT: 33.6 % — ABNORMAL LOW (ref 36.0–46.0)
Hemoglobin: 10.7 g/dL — ABNORMAL LOW (ref 12.0–15.0)
MCH: 29.2 pg (ref 26.0–34.0)
MCHC: 31.8 g/dL (ref 30.0–36.0)
MCV: 91.8 fL (ref 80.0–100.0)
Platelets: 187 10*3/uL (ref 150–400)
RBC: 3.66 MIL/uL — ABNORMAL LOW (ref 3.87–5.11)
RDW: 13.4 % (ref 11.5–15.5)
WBC: 5 10*3/uL (ref 4.0–10.5)
nRBC: 0 % (ref 0.0–0.2)

## 2019-06-08 LAB — GLUCOSE, CAPILLARY
Glucose-Capillary: 90 mg/dL (ref 70–99)
Glucose-Capillary: 204 mg/dL — ABNORMAL HIGH (ref 70–99)

## 2019-06-08 MED ORDER — ISOSORBIDE MONONITRATE ER 60 MG PO TB24: 60.0000 mg | ORAL_TABLET | Freq: Two times a day (BID) | ORAL | 2 refills | Status: DC

## 2019-06-08 MED ORDER — ATORVASTATIN CALCIUM 40 MG PO TABS: 40.0000 mg | ORAL_TABLET | Freq: Every day | ORAL | 0 refills | Status: DC

## 2019-06-08 MED ORDER — FUROSEMIDE 40 MG PO TABS: 40.0000 mg | ORAL_TABLET | Freq: Every day | ORAL | 2 refills | Status: DC

## 2019-06-08 MED ORDER — CLOPIDOGREL BISULFATE 75 MG PO TABS: 75.0000 mg | ORAL_TABLET | Freq: Every day | ORAL | 2 refills | Status: AC

## 2019-06-08 MED ORDER — AMLODIPINE BESYLATE 10 MG PO TABS: 10.0000 mg | ORAL_TABLET | Freq: Every day | ORAL | 2 refills | Status: DC

## 2019-06-08 MED ORDER — CARVEDILOL 12.5 MG PO TABS: 12.5000 mg | ORAL_TABLET | Freq: Two times a day (BID) | ORAL | 2 refills | Status: DC

## 2019-06-08 MED ORDER — HYDRALAZINE HCL 10 MG PO TABS: 10.0000 mg | ORAL_TABLET | Freq: Three times a day (TID) | ORAL | 2 refills | Status: DC

## 2019-06-08 NOTE — Discharge Summary (Signed)
Physician Discharge Summary  [name] XIP:[id number] DOB: 29-Aug-1932 DOA: 06/06/2019  PCP: Dierdre Harness, FNP  Admit date: 06/06/2019 Discharge date: 06/08/2019  Admitted From: Home Disposition: Home  Recommendations for Outpatient Follow-up:  1. Follow up with PCP in 1-2 weeks 2. Please obtain BMP/CBC in one week 3. Please follow up on the following pending results:  Home Health: Yes Equipment/Devices: None  Discharge Condition: Stable CODE STATUS: DNR Diet recommendation: Cardiac  Subjective: Seen and examined.  Granddaughter at the bedside.  Patient feels much better other than feeling just weak generalized.   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  In the ED patient was found to have hypertensive urgency with systolic blood pressure in the range of 200s despite administration of antihypertensive medications.  She also complained of chest pressure and headache.  Initial troponin negative.  EKG showed normal sinus rhythm with LVH, no acute ST-T changes.  Chest x-ray unremarkable.  Patient was admitted to hospital service for hypertensive urgency and chest pain.  Telemetry did not show any acute abnormality.  Her home medications for hypertension were resumed.  Patient's blood pressure has improved and remains within normal range.  I saw this patient today for the first time and I was able to gather more information from patient.  She told me that she apparently does not have any primary care physician as she has been having trouble finding one close to her where she can  transport herself without having to require her granddaughter to do so and that she ran out of most of the medications including all of the antihypertensives in last few weeks as they were prescribed to her by cardiologist 3 months ago.  Based on this, noncompliance or inability to take medications for blood pressure seems to be the main cause of her hypertensive urgency.  I strongly advised her to find a local PCP and follow-up closely.  She verbalized understanding.  Her granddaughter was also at the bedside who also verbalized understanding.  In the meantime, I have prescribed her all her antihypertensives and most other medications for 69-month supply so she will have plenty of time to arrange a PCP visit.  She is being discharged in stable condition. she was seen by PT OT and they recommended home health which has been ordered for her.  Of note, patient has chronic intermittent chest pain, upper abdominal pain and shortness of breath secondary to known single-vessel disease which is being managed medically.  Discharge Diagnoses:  Principal Problem:   Hypertensive urgency Active Problems:   Chronic diastolic heart failure (HCC)   Type 2 diabetes mellitus with vascular disease (HCC)   Stage 3 chronic kidney disease due to type 2 diabetes mellitus ([place])   GERD without esophagitis   Glaucoma due to type 2 diabetes mellitus (HCC)   HTN (hypertension)   HLD (hyperlipidemia)   Coronary artery disease involving native coronary artery of native heart without angina pectoris    Discharge Instructions   Allergies as of 06/08/2019   No Known Allergies     Medication List    TAKE these medications   acetaminophen 325 MG tablet Commonly known as: TYLENOL Take 2 tablets (  650 mg total) by mouth every 6 (six) hours as needed for mild pain or fever (or temp > 37.5 C (99.5 F)).   Alphagan P 0.1 % Soln Generic drug: brimonidine Place 1 drop into the right eye 2 (two) times daily.   amLODipine 10 MG  tablet Commonly known as: NORVASC Take 1 tablet (10 mg total) by mouth daily. Start taking on: Jun 09, 2019 What changed:   medication strength  how much to take   aspirin EC 81 MG tablet Take 81 mg by mouth daily.   atorvastatin 40 MG tablet Commonly known as: LIPITOR Take 1 tablet (40 mg total) by mouth daily at 6 PM.   carvedilol 12.5 MG tablet Commonly known as: COREG Take 1 tablet (12.5 mg total) by mouth 2 (two) times daily with a meal.   clopidogrel 75 MG tablet Commonly known as: PLAVIX Take 1 tablet (75 mg total) by mouth daily.   folic acid 1 MG tablet Commonly known as: FOLVITE Take 1 tablet (1 mg total) by mouth daily.   furosemide 40 MG tablet Commonly known as: LASIX Take 1 tablet (40 mg total) by mouth daily.   gabapentin 300 MG capsule Commonly known as: NEURONTIN Take 300 mg by mouth 3 (three) times daily.   hydrALAZINE 10 MG tablet Commonly known as: APRESOLINE Take 1 tablet (10 mg total) by mouth 3 (three) times daily.   isosorbide mononitrate 60 MG 24 hr tablet Commonly known as: IMDUR Take 1 tablet (60 mg total) by mouth 2 (two) times daily.   multivitamin with minerals Tabs tablet Take 1 tablet by mouth daily.   nitroGLYCERIN 0.4 MG SL tablet Commonly known as: NITROSTAT Place 1 tablet (0.4 mg total) under the tongue every 5 (five) minutes as needed for chest pain.   omeprazole 20 MG capsule Commonly known as: PRILOSEC Take 1 capsule (20 mg total) by mouth daily.   Travatan Z 0.004 % Soln ophthalmic solution Generic drug: Travoprost (BAK Free) Place 1 drop into the right eye at bedtime.      Follow-up Information    Dierdre Harness, FNP Follow up in 1 week(s).   Specialty: Family Medicine Contact information: [place] [address] [phone]        Belva Crome, MD .   Specialty: Cardiology Contact information: [phone] [place] [address] [phone]          No  Known Allergies  Consultations: None   Procedures/Studies: CT Head Wo Contrast  Result Date: 06/06/2019 CLINICAL DATA:  Headache, hypertension EXAM: CT HEAD WITHOUT CONTRAST TECHNIQUE: Contiguous axial images were obtained from the base of the skull through the vertex without intravenous contrast. COMPARISON:  04/11/2019, 04/09/2019 FINDINGS: Brain: No signs of acute infarct or hemorrhage. There are scattered chronic ischemic changes throughout the periventricular white matter, bilateral basal ganglia, and left frontal cortex. Diffuse cerebral atrophy. Lateral ventricles are unremarkable. No acute extra-axial fluid collections. No mass effect. Vascular: No hyperdense vessel or unexpected calcification. Skull: Normal. Negative for fracture or focal lesion. Sinuses/Orbits: No acute finding. Other: None. IMPRESSION: 1. Chronic ischemic changes as above. No acute intracranial process. Electronically Signed   By: Randa Ngo M.D.   On: 06/06/2019 22:27   DG Chest Port 1 View  Result Date: 06/07/2019 CLINICAL DATA:  Chest tightness. Hypertension. Patient reports she has been out of hypertension medications. EXAM: PORTABLE CHEST 1 VIEW COMPARISON:  04/11/2019 FINDINGS: Cardiomegaly, unchanged.The cardiomediastinal contours are unchanged. Atherosclerosis of  the aortic arch. Pulmonary vasculature is normal. No consolidation, pleural effusion, or pneumothorax. No acute osseous abnormalities are seen. IMPRESSION: Stable cardiomegaly. No acute abnormality. Aortic Atherosclerosis (ICD10-I70.0). Electronically Signed   By: Keith Rake M.D.   On: 06/07/2019 01:26      Discharge Exam: Vitals:   06/08/19 0507 06/08/19 0852  BP: (!) 174/77 (!) 134/51  Pulse: (!) 58 63  Resp: 16   Temp: 98 F (36.7 C)   SpO2: 93%    Vitals:   06/07/19 1508 06/07/19 2354 06/08/19 0507 06/08/19 0852  BP: 134/64 (!) 142/66 (!) 174/77 (!) 134/51  Pulse: 65 64 (!) 58 63  Resp: 18 16 16    Temp: 97.9 F (36.6 C) 97.8  F (36.6 C) 98 F (36.7 C)   TempSrc: Oral Oral Oral   SpO2: 91% 91% 93%   Weight: 68.6 kg     Height: 5' (1.524 m)       General: Pt is alert, awake, not in acute distress Cardiovascular: RRR, S1/S2 +, no rubs, no gallops Respiratory: CTA bilaterally, no wheezing, no rhonchi Abdominal: Soft, NT, ND, bowel sounds + Extremities: no edema, no cyanosis    The results of significant diagnostics from this hospitalization (including imaging, microbiology, ancillary and laboratory) are listed below for reference.     Microbiology: Recent Results (from the past 240 hour(s))  SARS Coronavirus 2 by RT PCR (hospital order, performed in [facility] hospital lab) Nasopharyngeal Nasopharyngeal Swab     Status: None   Collection Time: 06/06/19 11:48 PM   Specimen: Nasopharyngeal Swab  Result Value Ref Range Status   SARS Coronavirus 2 NEGATIVE NEGATIVE Final    Comment: (NOTE) SARS-CoV-2 target nucleic acids are NOT DETECTED. The SARS-CoV-2 RNA is generally detectable in upper and lower respiratory specimens during the acute phase of infection. The lowest concentration of SARS-CoV-2 viral copies this assay can detect is 250 copies / mL. A negative result does not preclude SARS-CoV-2 infection and should not be used as the sole basis for treatment or other patient management decisions.  A negative result may occur with improper specimen collection / handling, submission of specimen other than nasopharyngeal swab, presence of viral mutation(s) within the areas targeted by this assay, and inadequate number of viral copies (<250 copies / mL). A negative result must be combined with clinical observations, patient history, and epidemiological information. Fact Sheet for Patients:   StrictlyIdeas.no Fact Sheet for Healthcare Providers: BankingDealers.co.za This test is not yet approved or cleared  by the [place] FDA and has been authorized  for detection and/or diagnosis of SARS-CoV-2 by FDA under an Emergency Use Authorization (EUA).  This EUA will remain in effect (meaning this test can be used) for the duration of the COVID-19 declaration under Section 564(b)(1) of the Act, 21 U.S.C. section 360bbb-3(b)(1), unless the authorization is terminated or revoked sooner. Performed at [facility], [place] [address] [place], [place] [address]      Labs: BNP (last 3 results) Recent Labs    11/01/18 2313 01/20/19 0052 04/07/19 1307  BNP 244.8* 84.3 9,323.5*   Basic Metabolic Panel: Recent Labs  Lab 06/06/19 1332 06/07/19 0746 06/08/19 0445  NA 137 138 134*  K 4.2 3.6 4.0  CL 102 99 97*  CO2 27 28 27   GLUCOSE 104* 81 87  BUN 20 16 19   CREATININE 1.26* 1.29* 1.46*  CALCIUM 9.0 9.2 9.1   Liver Function Tests: No results for input(s): AST, ALT, ALKPHOS, BILITOT, PROT, ALBUMIN  in the last 168 hours. No results for input(s): LIPASE, AMYLASE in the last 168 hours. No results for input(s): AMMONIA in the last 168 hours. CBC: Recent Labs  Lab 06/06/19 1332 06/07/19 0746 06/08/19 0445  WBC 6.2 6.1 5.0  HGB 11.5* 11.0* 10.7*  HCT 37.0 34.8* 33.6*  MCV 95.6 93.0 91.8  PLT 213 226 187   Cardiac Enzymes: No results for input(s): CKTOTAL, CKMB, CKMBINDEX, TROPONINI in the last 168 hours. BNP: Invalid input(s): POCBNP CBG: Recent Labs  Lab 06/07/19 1128 06/07/19 1634 06/07/19 2150 06/08/19 0616 06/08/19 1104  GLUCAP 94 111* 92 90 204*   D-Dimer No results for input(s): DDIMER in the last 72 hours. Hgb A1c No results for input(s): HGBA1C in the last 72 hours. Lipid Profile No results for input(s): CHOL, HDL, LDLCALC, TRIG, CHOLHDL, LDLDIRECT in the last 72 hours. Thyroid function studies No results for input(s): TSH, T4TOTAL, T3FREE, THYROIDAB in the last 72 hours.  Invalid input(s): FREET3 Anemia work up No results for input(s): VITAMINB12, FOLATE, FERRITIN, TIBC, IRON, RETICCTPCT in the last 72  hours. Urinalysis    Component Value Date/Time   COLORURINE STRAW (A) 06/06/2019 1351   APPEARANCEUR CLEAR 06/06/2019 1351   LABSPEC 1.004 (L) 06/06/2019 1351   PHURINE 7.0 06/06/2019 1351   GLUCOSEU NEGATIVE 06/06/2019 1351   HGBUR NEGATIVE 06/06/2019 1351   BILIRUBINUR NEGATIVE 06/06/2019 1351   KETONESUR NEGATIVE 06/06/2019 1351   PROTEINUR NEGATIVE 06/06/2019 1351   UROBILINOGEN 0.2 02/07/2014 2241   NITRITE NEGATIVE 06/06/2019 1351   LEUKOCYTESUR NEGATIVE 06/06/2019 1351   Sepsis Labs Invalid input(s): PROCALCITONIN,  WBC,  LACTICIDVEN Microbiology Recent Results (from the past 240 hour(s))  SARS Coronavirus 2 by RT PCR (hospital order, performed in [place] hospital lab) Nasopharyngeal Nasopharyngeal Swab     Status: None   Collection Time: 06/06/19 11:48 PM   Specimen: Nasopharyngeal Swab  Result Value Ref Range Status   SARS Coronavirus 2 NEGATIVE NEGATIVE Final    Comment: (NOTE) SARS-CoV-2 target nucleic acids are NOT DETECTED. The SARS-CoV-2 RNA is generally detectable in upper and lower respiratory specimens during the acute phase of infection. The lowest concentration of SARS-CoV-2 viral copies this assay can detect is 250 copies / mL. A negative result does not preclude SARS-CoV-2 infection and should not be used as the sole basis for treatment or other patient management decisions.  A negative result may occur with improper specimen collection / handling, submission of specimen other than nasopharyngeal swab, presence of viral mutation(s) within the areas targeted by this assay, and inadequate number of viral copies (<250 copies / mL). A negative result must be combined with clinical observations, patient history, and epidemiological information. Fact Sheet for Patients:   StrictlyIdeas.no Fact Sheet for Healthcare Providers: BankingDealers.co.za This test is not yet approved or cleared  by the [place]  FDA and has been authorized for detection and/or diagnosis of SARS-CoV-2 by FDA under an Emergency Use Authorization (EUA).  This EUA will remain in effect (meaning this test can be used) for the duration of the COVID-19 declaration under Section 564(b)(1) of the Act, 21 U.S.C. section 360bbb-3(b)(1), unless the authorization is terminated or revoked sooner. Performed at [facility], [place] [address], [place], [place] [address]      Time coordinating discharge: Over 30 minutes  SIGNED:   Darliss Cheney, MD  [facility] 06/08/2019, 11:23 AM  If 7PM-7AM, please contact night-coverage [email or web address]

## 2019-06-08 NOTE — Discharge Instructions (Signed)

## 2019-06-08 NOTE — Progress Notes (Signed)
Nsg Discharge Note  Patient discharged home. Granddaughter [name] providing transportation.  Admit Date:  06/06/2019 Discharge date: 06/08/2019   Emmie Frakes to be D/C'd Home per MD order.  AVS completed.  Copy for chart, and copy for patient signed, and dated. Patient/caregiver able to verbalize understanding.  Discharge Medication: Allergies as of 06/08/2019   No Known Allergies     Medication List    TAKE these medications   acetaminophen 325 MG tablet Commonly known as: TYLENOL Take 2 tablets (650 mg total) by mouth every 6 (six) hours as needed for mild pain or fever (or temp > 37.5 C (99.5 F)).   Alphagan P 0.1 % Soln Generic drug: brimonidine Place 1 drop into the right eye 2 (two) times daily.   amLODipine 10 MG tablet Commonly known as: NORVASC Take 1 tablet (10 mg total) by mouth daily. Start taking on: Jun 09, 2019 What changed:   medication strength  how much to take   aspirin EC 81 MG tablet Take 81 mg by mouth daily.   atorvastatin 40 MG tablet Commonly known as: LIPITOR Take 1 tablet (40 mg total) by mouth daily at 6 PM.   carvedilol 12.5 MG tablet Commonly known as: COREG Take 1 tablet (12.5 mg total) by mouth 2 (two) times daily with a meal.   clopidogrel 75 MG tablet Commonly known as: PLAVIX Take 1 tablet (75 mg total) by mouth daily.   folic acid 1 MG tablet Commonly known as: FOLVITE Take 1 tablet (1 mg total) by mouth daily.   furosemide 40 MG tablet Commonly known as: LASIX Take 1 tablet (40 mg total) by mouth daily.   gabapentin 300 MG capsule Commonly known as: NEURONTIN Take 300 mg by mouth 3 (three) times daily.   hydrALAZINE 10 MG tablet Commonly known as: APRESOLINE Take 1 tablet (10 mg total) by mouth 3 (three) times daily.   isosorbide mononitrate 60 MG 24 hr tablet Commonly known as: IMDUR Take 1 tablet (60 mg total) by mouth 2 (two) times daily.   multivitamin with minerals Tabs tablet Take 1 tablet by mouth  daily.   nitroGLYCERIN 0.4 MG SL tablet Commonly known as: NITROSTAT Place 1 tablet (0.4 mg total) under the tongue every 5 (five) minutes as needed for chest pain.   omeprazole 20 MG capsule Commonly known as: PRILOSEC Take 1 capsule (20 mg total) by mouth daily.   Travatan Z 0.004 % Soln ophthalmic solution Generic drug: Travoprost (BAK Free) Place 1 drop into the right eye at bedtime.       Discharge Assessment: Vitals:   06/08/19 1228 06/08/19 1342  BP: (!) 115/56 (!) 109/51  Pulse: 63   Resp: 16   Temp:    SpO2: 94%    Skin clean, dry and intact without evidence of skin break down, no evidence of skin tears noted. IV catheter discontinued intact. Site without signs and symptoms of complications - no redness or edema noted at insertion site, patient denies c/o pain - only slight tenderness at site.  Dressing with slight pressure applied.  D/c Instructions-Education: Discharge instructions given to patient/family with verbalized understanding. D/c education completed with patient/family including follow up instructions, medication list, d/c activities limitations if indicated, with other d/c instructions as indicated by MD - patient able to verbalize understanding, all questions fully answered. Patient instructed to return to ED, call 911, or call MD for any changes in condition.  Patient escorted via [place], and D/C home via private auto.  Erasmo Leventhal, RN 06/08/2019 3:49 PM

## 2019-06-08 NOTE — Social Work (Addendum)
CSW acknowledging consult for [facility], pt active with [facility] per chart review. Pt under observation care, orders requested for HHPT/OT/RN. This has been confirmed with [facility].   Westley Hummer, MSW, [place] Work

## 2019-06-08 NOTE — Progress Notes (Signed)
Occupational Therapy Evaluation  PTA, pt lived alone and had intermittent assistance from family. Pt states she has a "fall alert" system. Recommend follow up with HHOT due to below noted deficits to maximize independence and reduce risks of falls. Pt would benefit from referral to [facility] given her low vision deficits. All further OT to be addressed by [facility].    06/08/19 1200  OT Visit Information  Last OT Received On 06/08/19  Assistance Needed +1  History of Present Illness Pt is a 86 y.o. female with medical history significant of hypertension, hyperlipidemia, diabetes mellitus type 2 diet-controlled, single vessel obstructive coronary artery disease with chronic anginal symptoms, history of stroke, gastroesophageal reflux disease, anxiety/depression, left eye blindness, right eye glaucoma, chronic kidney disease stage III, congestive heart failure with preserved ejection fraction who was sent to the ED by visiting nurse due to poorly controlled blood pressure.  Precautions  Precautions Fall  Home Living  Family/patient expects to be discharged to: Private residence  Living Arrangements Alone  Available Help at Discharge Family;Available PRN/intermittently  Type of Home House  Home Access Level entry  Home Layout One level  Bathroom Shower/Tub Tub/shower unit  Corporate investment banker held shower head;Tub bench;Cane - single point;Walker - 4 wheels  Prior Function  Level of Independence Independent with assistive device(s)  Gait / Transfers Assistance Needed Can ambulate in community with cane although reports fatigue and legs hurt  ADL's / [place] with ADLs and IADLs; does not drive- uses public transportation; does do shopping  Pain Assessment  Pain Assessment Faces  Faces Pain Scale 2  Pain Location bil feet  Pain Descriptors / Indicators Discomfort;Sore  Pain Intervention(s) Limited activity within patient's  tolerance  Cognition  Arousal/Alertness Awake/alert  Behavior During Therapy WFL for tasks assessed/performed  Overall Cognitive Status Within Functional Limits for tasks assessed  Upper Extremity Assessment  Upper Extremity Assessment Overall WFL for tasks assessed  Lower Extremity Assessment  Lower Extremity Assessment Defer to PT evaluation  Cervical / Trunk Assessment  Cervical / Trunk Assessment Normal  ADL  Overall ADL's  Needs assistance/impaired  Grooming Modified independent;Standing  Upper Body Bathing Modified independent;Sitting  Lower Body Bathing Supervison/ safety;Sit to/from stand;Min guard  Upper Body Dressing  Set up;Sitting  Lower Body Dressing Min guard;Sit to/from Environmental education officer guard;Ambulation  Toilet Transfer Details (indicate cue type and reason) 1LOB after standing from commode but able to regain independently  Toileting- Clothing Manipulation and Hygiene Supervision/safety  Functional mobility during ADLs Min guard  General ADL Comments Pt unsteady adnh reaching for items to support her. Pt also in unfamiliar environment adn given her lowvision deficits would most likley navigate her own home in a safer way. Recommend pt sponge bath until she has S with bathing. Pt verbalied understanding. Recommend use of RW inhome. Issued walker bag, long handled sponge and reacher to help with enviornmental tasks and reduce risks of falls.   Vision- History  Baseline Vision/History Glaucoma;Legally blind  Vision- Assessment  Additional Comments Blind L eye; glaucoma R eye  Bed Mobility  Overal bed mobility Modified Independent  Transfers  Overall transfer level Needs assistance  Transfers Sit to/from Stand;Stand Pivot Transfers  Sit to Stand Supervision  Stand pivot transfers Supervision  Balance  Overall balance assessment Needs assistance  Sitting balance-Leahy Scale Good  Standing balance-Leahy Scale Fair  OT - End of Session  Activity Tolerance  Patient tolerated treatment well  Patient  left in bed;with call bell/phone within reach;with bed alarm set  Nurse Communication Mobility status;Other (comment) (DC needs)  OT Assessment  OT Recommendation/Assessment All further OT needs can be met in the next venue of care  OT Visit Diagnosis Unsteadiness on feet (R26.81);Pain  Pain - part of body  (B feet)  OT Problem List Impaired balance (sitting and/or standing);Decreased safety awareness;Decreased knowledge of use of DME or AE;Pain  AM-PAC OT "6 Clicks" Daily Activity Outcome Measure (Version 2)  Help from another person eating meals? 4  Help from another person taking care of personal grooming? 3  Help from another person toileting, which includes using toliet, bedpan, or urinal? 3  Help from another person bathing (including washing, rinsing, drying)? 3  Help from another person to put on and taking off regular upper body clothing? 3  Help from another person to put on and taking off regular lower body clothing? 3  6 Click Score 19  OT Recommendation  Follow Up Recommendations Home health OT;Supervision - Intermittent  OT Equipment None recommended by OT  Individuals Consulted  Consulted and Agree with Results and Recommendations Patient  Acute Rehab OT Goals  Patient Stated Goal return home; be independent  OT Goal Formulation With patient  OT Time Calculation  OT Start Time (ACUTE ONLY) 1200  OT Stop Time (ACUTE ONLY) 1219  OT Time Calculation (min) 19 min  OT General Charges  $OT Visit 1 Visit  OT Evaluation  $OT Eval Low Complexity 1 Low  Written Expression  Dominant Hand Right  Maurie Boettcher, OT/L   Acute OT Clinical Specialist Acute Rehabilitation Services Pager [phone] Office [phone]

## 2019-06-08 NOTE — Evaluation (Signed)
Physical Therapy Evaluation Patient Details Name: [name] MRN: [id number] DOB: 12-Jan-1933 Today's Date: 06/08/2019   History of Present Illness  Pt is a 86 y.o. female with medical history significant of hypertension, hyperlipidemia, diabetes mellitus type 2 diet-controlled, single vessel obstructive coronary artery disease with chronic anginal symptoms, history of stroke, gastroesophageal reflux disease, anxiety/depression, left eye blindness, right eye glaucoma, chronic kidney disease stage III, congestive heart failure with preserved ejection fraction who was sent to the ED by visiting nurse due to poorly controlled blood pressure.  Clinical Impression  Pt admitted with above diagnosis. Pt presenting with near baseline mobility, but does present with decreased strength, balance, mobility, and endurance.  Pt with bil foot pain that she reports increased in March and has been ambulating slower but still independent since that time.  Today pt able to transfer and ambulate with supervision to min guard level.  Will benefit from PT to progress mobility and independence.  Pt currently with functional limitations due to the deficits listed below (see PT Problem List). Pt will benefit from skilled PT to increase their independence and safety with mobility to allow discharge to the venue listed below.       Follow Up Recommendations Home health PT;Supervision - Intermittent    Equipment Recommendations  None recommended by PT(has DME)    Recommendations for Other Services       Precautions / Restrictions Precautions Precautions: None Restrictions Weight Bearing Restrictions: No      Mobility  Bed Mobility Overal bed mobility: Needs Assistance Bed Mobility: Supine to Sit;Sit to Supine     Supine to sit: Supervision Sit to supine: Supervision      Transfers Overall transfer level: Needs assistance Equipment used: None Transfers: Sit to/from Stand Sit to Stand: Supervision            Ambulation/Gait Ambulation/Gait assistance: Min guard Gait Distance (Feet): 100 Feet Assistive device: (HHA or hand rail) Gait Pattern/deviations: Decreased stride length;Shuffle Gait velocity: decreased   General Gait Details: Pt with slow gait but steady and no LOB.  Able to perform head turns, stop, and turn without LOB.  Reports typically uses cane at home.  Stairs            Wheelchair Mobility    Modified Rankin (Stroke Patients Only)       Balance Overall balance assessment: Needs assistance Sitting-balance support: No upper extremity supported Sitting balance-Leahy Scale: Normal     Standing balance support: No upper extremity supported;During functional activity Standing balance-Leahy Scale: Good                               Pertinent Vitals/Pain Pain Assessment: 0-10 Pain Score: 5  Pain Location: bil feet Pain Descriptors / Indicators: Discomfort;Sore Pain Intervention(s): Limited activity within patient's tolerance;Premedicated before session  Pt reports pain in bil feet from neuropathy that increased in March after she had CVA    Home Living Family/patient expects to be discharged to:: Private residence Living Arrangements: Alone Available Help at Discharge: Family;Available PRN/intermittently Type of Home: House Home Access: Level entry     Home Layout: One level Home Equipment: Hand held shower head;Tub bench;Cane - single point;Walker - 4 wheels Additional Comments: Has life alert button    Prior Function Level of Independence: Independent with assistive device(s)   Gait / Transfers Assistance Needed: Can ambulate in community with cane although reports fatigue and legs hurt  ADL's / [employer]  Needed: Independent with ADLs and IADLs; does not drive- uses public transportation; does do shopping   Denies falls.         Hand Dominance        Extremity/Trunk Assessment   Upper Extremity  Assessment Upper Extremity Assessment: Overall WFL for tasks assessed    Lower Extremity Assessment Lower Extremity Assessment: Overall WFL for tasks assessed    Cervical / Trunk Assessment Cervical / Trunk Assessment: Normal  Communication   Communication: No difficulties  Cognition Arousal/Alertness: Awake/alert Behavior During Therapy: WFL for tasks assessed/performed Overall Cognitive Status: Within Functional Limits for tasks assessed                                        General Comments General comments (skin integrity, edema, etc.): vss    Exercises     Assessment/Plan    PT Assessment Patient needs continued PT services  PT Problem List Decreased strength;Decreased mobility;Decreased safety awareness;Decreased activity tolerance;Decreased coordination;Cardiopulmonary status limiting activity;Decreased balance;Decreased knowledge of use of DME       PT Treatment Interventions DME instruction;Therapeutic activities;Gait training;Therapeutic exercise;Patient/family education;Stair training;Balance training;Functional mobility training    PT Goals (Current goals can be found in the Care Plan section)  Acute Rehab PT Goals Patient Stated Goal: return home; be independent PT Goal Formulation: With patient/family Time For Goal Achievement: 06/22/19 Potential to Achieve Goals: Good    Frequency Min 3X/week   Barriers to discharge        Co-evaluation               AM-PAC PT "6 Clicks" Mobility  Outcome Measure Help needed turning from your back to your side while in a flat bed without using bedrails?: None Help needed moving from lying on your back to sitting on the side of a flat bed without using bedrails?: None Help needed moving to and from a bed to a chair (including a wheelchair)?: None Help needed standing up from a chair using your arms (e.g., wheelchair or bedside chair)?: None Help needed to walk in hospital room?: None Help  needed climbing 3-5 steps with a railing? : A Little 6 Click Score: 23    End of Session Equipment Utilized During Treatment: Gait belt Activity Tolerance: Patient tolerated treatment well Patient left: in bed;with call bell/phone within reach;with bed alarm set Nurse Communication: Mobility status PT Visit Diagnosis: Other abnormalities of gait and mobility (R26.89)    Time: 4315-4008 PT Time Calculation (min) (ACUTE ONLY): 22 min   Charges:   PT Evaluation $PT Eval Low Complexity: 1 Low          Maggie Font, PT Acute Rehab Services Pager [phone] [facility] [phone] [facility] 06/08/2019, 11:02 AM

## 2019-06-10 ENCOUNTER — Telehealth: Payer: Self-pay | Admitting: Interventional Cardiology

## 2019-06-10 NOTE — Telephone Encounter (Signed)
Will route to Dr. Tamala Julian for approval of continued care for HTN/

## 2019-06-10 NOTE — Telephone Encounter (Signed)
[name] from [facility] is calling requesting a verbal order to continue visits with [name] twice a week for 2 weeks, once a week for 7 weeks, & 1 PRN. The focus is on her hypertension and heart failure. Please advise.

## 2019-06-13 NOTE — Telephone Encounter (Signed)
Okay 

## 2019-06-14 NOTE — Telephone Encounter (Signed)
Spoke with April with [facility] and let her know Dr. Tamala Julian said ok to continue visits as outlined below for HTN.  April appreciative for call.

## 2019-07-29 ENCOUNTER — Other Ambulatory Visit: Payer: Self-pay | Admitting: Cardiology

## 2019-08-05 DIAGNOSIS — E119 Type 2 diabetes mellitus without complications: Secondary | ICD-10-CM | POA: Diagnosis not present

## 2019-08-05 DIAGNOSIS — H401112 Primary open-angle glaucoma, right eye, moderate stage: Secondary | ICD-10-CM | POA: Diagnosis not present

## 2019-08-05 DIAGNOSIS — H2511 Age-related nuclear cataract, right eye: Secondary | ICD-10-CM | POA: Diagnosis not present

## 2019-08-18 ENCOUNTER — Ambulatory Visit (INDEPENDENT_AMBULATORY_CARE_PROVIDER_SITE_OTHER): Payer: Medicare HMO | Admitting: Otolaryngology

## 2019-08-18 ENCOUNTER — Other Ambulatory Visit: Payer: Self-pay

## 2019-08-18 ENCOUNTER — Encounter (INDEPENDENT_AMBULATORY_CARE_PROVIDER_SITE_OTHER): Payer: Self-pay | Admitting: Otolaryngology

## 2019-08-18 VITALS — Temp 97.2°F

## 2019-08-18 DIAGNOSIS — H60312 Diffuse otitis externa, left ear: Secondary | ICD-10-CM | POA: Diagnosis not present

## 2019-08-18 DIAGNOSIS — R490 Dysphonia: Secondary | ICD-10-CM | POA: Diagnosis not present

## 2019-08-18 NOTE — Progress Notes (Signed)
HPI: [name] is a 86 y.o. female who returns today for evaluation of hoarseness and left ear pain.  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  She has had chronic problems with dry mouth.  More recently has had problems with hoarseness.  I performed direct laryngoscopy on her 10 years ago that showed polypoid lesions and inflamed atypical squamous epithelium and fungal organisms consistent with Candida species.  She has had a chronic voice problems.  They more recently has been having left ear problems.  Past Medical History:  Diagnosis Date  . Anemia 04/03/2011  . Anginal pain ([place])    a. 02/2014 MV: Low risk; b. 05/2017 MV: small area of inferior basal ischemia. EF 54% - low risk-->Med mgmt.  Marland Kitchen Anxiety   . Aortic valve sclerosis w/ systolic murmur    a. 44/8185 AoV sclerosis w/o stenosis.  . Arthritis    "feet" (06/02/2017)  . Blind left eye 1999  . CKD (chronic kidney disease) stage 3, GFR 30-59 ml/min   . Depression   . Diastolic dysfunction    a. 10/2018 Echo: EF 60-65%, mild septal/posterior LVH. Impaired relaxation. Nl RV size/fxn. Mod dil LA. Trace TR. Triv AI. Mod AoV sclerosis w/o stenosis. Nl PASP.  Marland Kitchen Exertional dyspnea   . GERD (gastroesophageal reflux disease)   . High cholesterol   . Hip fracture ([place]) 07/17/11   fall from 1-2 feet; left  . Hypertension   . Ischemic stroke ([place]) 05/2017   a. 06/2017 Holter: RSR, PACs, no afib.  . Prosthetic eye globe 1999   "from diabetic; left eye"  . Type II diabetes mellitus ([place])    not taking any medication (06/02/2017)   Past Surgical History:  Procedure Laterality Date  . ENUCLEATION Left   . FRACTURE SURGERY    . HIP ARTHROPLASTY  07/18/2011   Procedure: ARTHROPLASTY BIPOLAR HIP;  Surgeon: Nita Sells, MD;  Location: [facility];  Service: Orthopedics;  Laterality: Left;  . INTRAOCULAR PROSTHESES INSERTION  1999   left  . LEFT HEART CATH AND  CORS/GRAFTS ANGIOGRAPHY N/A 04/08/2019   Procedure: LEFT HEART CATH AND CORS/GRAFTS ANGIOGRAPHY;  Surgeon: Minna Merritts, MD;  Location: [facility];  Service: Cardiovascular;  Laterality: N/A;  . [place]   "cancer"  . TUBAL LIGATION  1970's   Social History   Socioeconomic History  . Marital status: Single    Spouse name: Not on file  . Number of children: Not on file  . Years of education: Not on file  . Highest education level: Not on file  Occupational History  . Not on file  Tobacco Use  . Smoking status: Former Smoker    Packs/day: 1.50    Years: 47.00    Pack years: 70.50    Types: Cigarettes    Quit date: 09/13/1997    Years since quitting: 21.9  . Smokeless tobacco: Never Used  Vaping Use  . Vaping Use: Never used  Substance and Sexual Activity  . Alcohol use: Not Currently    Alcohol/week: 17.0 standard drinks    Types: 17 Shots of liquor per week    Comment: Quit 03/2018  . Drug use: No    Types: Marijuana, Heroin    Comment: 06/02/2017  "last drug use was in the 1980s." Prev injected heroin.  Marland Kitchen Sexual activity: Not Currently  Other Topics Concern  . Not on file  Social  History Narrative   Originally from [place] but moved to [place] @ age 48.  Moved to [place] ~ 2011 to be near grandchildren.   Social Determinants of Health   Financial Resource Strain:   . Difficulty of Paying Living Expenses:   Food Insecurity:   . Worried About Charity fundraiser in the Last Year:   . Arboriculturist in the Last Year:   Transportation Needs:   . Film/video editor (Medical):   Marland Kitchen Lack of Transportation (Non-Medical):   Physical Activity:   . Days of Exercise per Week:   . Minutes of Exercise per Session:   Stress:   . Feeling of Stress :   Social Connections:   . Frequency of Communication with Friends and Family:   . Frequency of Social Gatherings with Friends and Family:   . Attends Religious Services:   . Active Member of Clubs or  Organizations:   . Attends Archivist Meetings:   Marland Kitchen Marital Status:    Family History  Problem Relation Age of Onset  . Diabetes type II Mother   . Diabetes type II Father   . Diabetes type II Sister   . Colon cancer Neg Hx    No Known Allergies Prior to Admission medications   Medication Sig Start Date End Date Taking? Authorizing Provider  acetaminophen (TYLENOL) 325 MG tablet Take 2 tablets (650 mg total) by mouth every 6 (six) hours as needed for mild pain or fever (or temp > 37.5 C (99.5 F)). 06/05/17  Yes Elgergawy, Silver Huguenin, MD  ALPHAGAN P 0.1 % SOLN Place 1 drop into the right eye 2 (two) times daily. 10/10/15  Yes [provider]  amLODipine (NORVASC) 10 MG tablet Take 1 tablet (10 mg total) by mouth daily. 06/09/19 09/07/19 Yes Pahwani, Einar Grad, MD  aspirin EC 81 MG tablet Take 81 mg by mouth daily.   Yes [provider]  atorvastatin (LIPITOR) 40 MG tablet Take 1 tablet (40 mg total) by mouth daily at 6 PM. 06/08/19  Yes Pahwani, Einar Grad, MD  carvedilol (COREG) 12.5 MG tablet Take 1 tablet (12.5 mg total) by mouth 2 (two) times daily with a meal. 06/08/19 09/06/19 Yes Pahwani, Einar Grad, MD  clopidogrel (PLAVIX) 75 MG tablet Take 1 tablet (75 mg total) by mouth daily. 06/08/19 09/06/19 Yes Pahwani, Einar Grad, MD  folic acid (FOLVITE) 1 MG tablet Take 1 tablet (1 mg total) by mouth daily. 06/06/17  Yes Elgergawy, Silver Huguenin, MD  furosemide (LASIX) 40 MG tablet Take 1 tablet (40 mg total) by mouth daily. 06/08/19 09/06/19 Yes Pahwani, Einar Grad, MD  gabapentin (NEURONTIN) 300 MG capsule Take 300 mg by mouth 3 (three) times daily.    Yes [provider]  hydrALAZINE (APRESOLINE) 10 MG tablet TAKE 1 TABLET(10 MG) BY MOUTH THREE TIMES DAILY 08/01/19  Yes Belva Crome, MD  Multiple Vitamin (MULTIVITAMIN WITH MINERALS) TABS tablet Take 1 tablet by mouth daily. 06/06/17  Yes Elgergawy, Silver Huguenin, MD  omeprazole (PRILOSEC) 20 MG capsule Take 1 capsule (20 mg total) by mouth daily.  02/11/19  Yes Isaiah Serge, NP  TRAVATAN Z 0.004 % SOLN ophthalmic solution Place 1 drop into the right eye at bedtime.  11/24/12  Yes [provider]  isosorbide mononitrate (IMDUR) 60 MG 24 hr tablet Take 1 tablet (60 mg total) by mouth 2 (two) times daily. 06/08/19 07/08/19  Darliss Cheney, MD  nitroGLYCERIN (NITROSTAT) 0.4 MG SL tablet Place 1 tablet (0.4  mg total) under the tongue every 5 (five) minutes as needed for chest pain. 02/11/19 06/07/19  Isaiah Serge, NP     Positive ROS: Otherwise negative  All other systems have been reviewed and were otherwise negative with the exception of those mentioned in the HPI and as above.  Physical Exam: Constitutional: Alert, well-appearing, no acute distress Ears: External ears without lesions or tenderness.  Left ear canal reveals exposed bone within the ear canal and inflammatory changes around the exposed bone.  I cleaned the ear canal with hydroperoxide and suction after cleaning the ear canal I applied gentian violet, Ciprodex and CSF powder. Nasal: External nose without lesions.. Clear nasal passages Oral: Lips and gums without lesions. Tongue and palate mucosa without lesions. Posterior oropharynx clear.  Tonsil regions appear benign bilaterally.  Indirect laryngoscopy revealed a clear base of tongue. Fiberoptic laryngoscopy through the right nostril revealed clear nasopharynx.  The base of tongue and epiglottis were clear although she has slight edema of the epiglottis.  She had supraglottic edema making evaluation of vocal cords little bit difficult.  However on visualization of the vocal cords she had small anterior vocal cord nodules but normal vocal cord mobility otherwise. Neck: No palpable adenopathy or masses Respiratory: Breathing comfortably  Skin: No facial/neck lesions or rash noted.  Binocular microscopy  Date/Time: 08/18/2019 6:14 PM Performed by: Rozetta Nunnery, MD Authorized by: Rozetta Nunnery, MD    Consent:    Consent obtained:  Verbal   Consent given by:  Patient   Alternatives discussed:  No treatment Procedure details:    Indications: otitis externa     Scope location: left ear     Right speculum size:  3 Cerumen:    Right ear: normal quantity of cerumen     Left ear: normal quantity of cerumen   Left ear canal:    drainage, exposed bone and tenderness   Left tympanic membrane:    Mobility: fully mobile   Post-procedure details:    Patient tolerance of procedure:  Tolerated well, no immediate complications Comments:     After cleaning the ear canal on the left side patient had exposed bone and inflammatory changes.  I applied gentian violet, Ciprodex and CSF powder to the left ear. Laryngoscopy  Date/Time: 08/18/2019 6:18 PM Performed by: Rozetta Nunnery, MD Authorized by: Rozetta Nunnery, MD   Consent:    Consent obtained:  Verbal   Consent given by:  Patient Procedure details:    Indications: hoarseness, dysphagia, or aspiration     Medication:  Afrin   Instrument: flexible fiberoptic laryngoscope     Scope location: right nare   Sinus:    Right nasopharynx: normal   Mouth:    Oropharynx: normal     Vallecula: normal     Base of tongue: normal     Epiglottis: normal   Throat:    True vocal cords: normal   Comments:     On fiberoptic laryngoscopy patient has glottic edema.  She has small vocal cord nodules with normal vocal cord mobility otherwise.    Assessment: Chronic hoarseness Left external otitis with exposed bone. History of base of tongue cancer status post radiation therapy 22 years ago.  Plan: Prescribed Cortisporin otic suspension drops to use in the left ear if she has any pain in the left ear. She will follow-up in 2 months for recheck.   Radene Journey, MD

## 2019-08-24 DIAGNOSIS — I129 Hypertensive chronic kidney disease with stage 1 through stage 4 chronic kidney disease, or unspecified chronic kidney disease: Secondary | ICD-10-CM | POA: Diagnosis not present

## 2019-08-24 DIAGNOSIS — R490 Dysphonia: Secondary | ICD-10-CM | POA: Diagnosis not present

## 2019-08-24 DIAGNOSIS — N183 Chronic kidney disease, stage 3 unspecified: Secondary | ICD-10-CM | POA: Diagnosis not present

## 2019-08-24 DIAGNOSIS — E872 Acidosis: Secondary | ICD-10-CM | POA: Diagnosis not present

## 2019-08-24 DIAGNOSIS — E875 Hyperkalemia: Secondary | ICD-10-CM | POA: Diagnosis not present

## 2019-09-14 DIAGNOSIS — H401112 Primary open-angle glaucoma, right eye, moderate stage: Secondary | ICD-10-CM | POA: Diagnosis not present

## 2019-10-18 ENCOUNTER — Ambulatory Visit (INDEPENDENT_AMBULATORY_CARE_PROVIDER_SITE_OTHER): Payer: Medicare HMO | Admitting: Otolaryngology

## 2019-10-28 DIAGNOSIS — I10 Essential (primary) hypertension: Secondary | ICD-10-CM | POA: Diagnosis not present

## 2019-10-28 DIAGNOSIS — Z79899 Other long term (current) drug therapy: Secondary | ICD-10-CM | POA: Diagnosis not present

## 2019-10-28 DIAGNOSIS — F17211 Nicotine dependence, cigarettes, in remission: Secondary | ICD-10-CM | POA: Diagnosis not present

## 2019-10-28 DIAGNOSIS — E559 Vitamin D deficiency, unspecified: Secondary | ICD-10-CM | POA: Diagnosis not present

## 2019-10-28 DIAGNOSIS — I2510 Atherosclerotic heart disease of native coronary artery without angina pectoris: Secondary | ICD-10-CM | POA: Diagnosis not present

## 2019-10-28 DIAGNOSIS — N189 Chronic kidney disease, unspecified: Secondary | ICD-10-CM | POA: Diagnosis not present

## 2019-10-28 DIAGNOSIS — C140 Malignant neoplasm of pharynx, unspecified: Secondary | ICD-10-CM | POA: Diagnosis not present

## 2019-10-28 DIAGNOSIS — R32 Unspecified urinary incontinence: Secondary | ICD-10-CM | POA: Diagnosis not present

## 2019-10-28 DIAGNOSIS — E785 Hyperlipidemia, unspecified: Secondary | ICD-10-CM | POA: Diagnosis not present

## 2019-11-23 ENCOUNTER — Emergency Department (HOSPITAL_COMMUNITY): Payer: Medicare HMO

## 2019-11-23 ENCOUNTER — Other Ambulatory Visit: Payer: Self-pay

## 2019-11-23 ENCOUNTER — Inpatient Hospital Stay (HOSPITAL_COMMUNITY)
Admission: EM | Admit: 2019-11-23 | Discharge: 2019-11-27 | DRG: 177 | Disposition: A | Discharge status: Home / Self Care | Payer: Medicare HMO | Attending: Family Medicine | Admitting: Family Medicine

## 2019-11-23 DIAGNOSIS — Z7982 Long term (current) use of aspirin: Secondary | ICD-10-CM

## 2019-11-23 DIAGNOSIS — E876 Hypokalemia: Secondary | ICD-10-CM | POA: Diagnosis present

## 2019-11-23 DIAGNOSIS — R1310 Dysphagia, unspecified: Secondary | ICD-10-CM

## 2019-11-23 DIAGNOSIS — E869 Volume depletion, unspecified: Secondary | ICD-10-CM | POA: Diagnosis present

## 2019-11-23 DIAGNOSIS — Z96642 Presence of left artificial hip joint: Secondary | ICD-10-CM | POA: Diagnosis present

## 2019-11-23 DIAGNOSIS — I130 Hypertensive heart and chronic kidney disease with heart failure and stage 1 through stage 4 chronic kidney disease, or unspecified chronic kidney disease: Secondary | ICD-10-CM | POA: Diagnosis present

## 2019-11-23 DIAGNOSIS — I2510 Atherosclerotic heart disease of native coronary artery without angina pectoris: Secondary | ICD-10-CM | POA: Diagnosis present

## 2019-11-23 DIAGNOSIS — J9601 Acute respiratory failure with hypoxia: Secondary | ICD-10-CM | POA: Diagnosis present

## 2019-11-23 DIAGNOSIS — R54 Age-related physical debility: Secondary | ICD-10-CM | POA: Diagnosis present

## 2019-11-23 DIAGNOSIS — X58XXXA Exposure to other specified factors, initial encounter: Secondary | ICD-10-CM | POA: Diagnosis present

## 2019-11-23 DIAGNOSIS — Z66 Do not resuscitate: Secondary | ICD-10-CM | POA: Diagnosis present

## 2019-11-23 DIAGNOSIS — R490 Dysphonia: Secondary | ICD-10-CM | POA: Diagnosis not present

## 2019-11-23 DIAGNOSIS — H5462 Unqualified visual loss, left eye, normal vision right eye: Secondary | ICD-10-CM | POA: Diagnosis present

## 2019-11-23 DIAGNOSIS — R0902 Hypoxemia: Secondary | ICD-10-CM | POA: Diagnosis not present

## 2019-11-23 DIAGNOSIS — K224 Dyskinesia of esophagus: Secondary | ICD-10-CM | POA: Diagnosis present

## 2019-11-23 DIAGNOSIS — M4854XA Collapsed vertebra, not elsewhere classified, thoracic region, initial encounter for fracture: Secondary | ICD-10-CM | POA: Diagnosis present

## 2019-11-23 DIAGNOSIS — J9621 Acute and chronic respiratory failure with hypoxia: Secondary | ICD-10-CM | POA: Diagnosis present

## 2019-11-23 DIAGNOSIS — J382 Nodules of vocal cords: Secondary | ICD-10-CM | POA: Diagnosis present

## 2019-11-23 DIAGNOSIS — F1010 Alcohol abuse, uncomplicated: Secondary | ICD-10-CM | POA: Diagnosis present

## 2019-11-23 DIAGNOSIS — J690 Pneumonitis due to inhalation of food and vomit: Principal | ICD-10-CM | POA: Diagnosis present

## 2019-11-23 DIAGNOSIS — Z923 Personal history of irradiation: Secondary | ICD-10-CM

## 2019-11-23 DIAGNOSIS — Z794 Long term (current) use of insulin: Secondary | ICD-10-CM

## 2019-11-23 DIAGNOSIS — I5032 Chronic diastolic (congestive) heart failure: Secondary | ICD-10-CM | POA: Diagnosis present

## 2019-11-23 DIAGNOSIS — J479 Bronchiectasis, uncomplicated: Secondary | ICD-10-CM | POA: Diagnosis present

## 2019-11-23 DIAGNOSIS — E1122 Type 2 diabetes mellitus with diabetic chronic kidney disease: Secondary | ICD-10-CM | POA: Diagnosis present

## 2019-11-23 DIAGNOSIS — N1832 Chronic kidney disease, stage 3b: Secondary | ICD-10-CM | POA: Diagnosis present

## 2019-11-23 DIAGNOSIS — E7800 Pure hypercholesterolemia, unspecified: Secondary | ICD-10-CM | POA: Diagnosis present

## 2019-11-23 DIAGNOSIS — K219 Gastro-esophageal reflux disease without esophagitis: Secondary | ICD-10-CM | POA: Diagnosis present

## 2019-11-23 DIAGNOSIS — Z8673 Personal history of transient ischemic attack (TIA), and cerebral infarction without residual deficits: Secondary | ICD-10-CM

## 2019-11-23 DIAGNOSIS — Z87891 Personal history of nicotine dependence: Secondary | ICD-10-CM

## 2019-11-23 DIAGNOSIS — Z20822 Contact with and (suspected) exposure to covid-19: Secondary | ICD-10-CM | POA: Diagnosis present

## 2019-11-23 DIAGNOSIS — Z85810 Personal history of malignant neoplasm of tongue: Secondary | ICD-10-CM

## 2019-11-23 DIAGNOSIS — N183 Chronic kidney disease, stage 3 unspecified: Secondary | ICD-10-CM | POA: Diagnosis present

## 2019-11-23 DIAGNOSIS — Z970 Presence of artificial eye: Secondary | ICD-10-CM

## 2019-11-23 DIAGNOSIS — Z833 Family history of diabetes mellitus: Secondary | ICD-10-CM

## 2019-11-23 DIAGNOSIS — I25119 Atherosclerotic heart disease of native coronary artery with unspecified angina pectoris: Secondary | ICD-10-CM | POA: Diagnosis present

## 2019-11-23 DIAGNOSIS — Z79899 Other long term (current) drug therapy: Secondary | ICD-10-CM

## 2019-11-23 DIAGNOSIS — E785 Hyperlipidemia, unspecified: Secondary | ICD-10-CM | POA: Diagnosis present

## 2019-11-23 LAB — BASIC METABOLIC PANEL
Anion gap: 12 (ref 5–15)
BUN: 14 mg/dL (ref 8–23)
CO2: 23 mmol/L (ref 22–32)
Calcium: 9 mg/dL (ref 8.9–10.3)
Chloride: 104 mmol/L (ref 98–111)
Creatinine, Ser: 1.76 mg/dL — ABNORMAL HIGH (ref 0.44–1.00)
GFR, Estimated: 28 mL/min — ABNORMAL LOW (ref >60)
Glucose, Bld: 210 mg/dL — ABNORMAL HIGH (ref 70–99)
Potassium: 3.5 mmol/L (ref 3.5–5.1)
Sodium: 139 mmol/L (ref 135–145)

## 2019-11-23 LAB — CBC
HCT: 36.2 % (ref 36.0–46.0)
Hemoglobin: 11.2 g/dL — ABNORMAL LOW (ref 12.0–15.0)
MCH: 28.1 pg (ref 26.0–34.0)
MCHC: 30.9 g/dL (ref 30.0–36.0)
MCV: 90.7 fL (ref 80.0–100.0)
Platelets: 271 10*3/uL (ref 150–400)
RBC: 3.99 MIL/uL (ref 3.87–5.11)
RDW: 17.8 % — ABNORMAL HIGH (ref 11.5–15.5)
WBC: 5.6 10*3/uL (ref 4.0–10.5)
nRBC: 0 % (ref 0.0–0.2)

## 2019-11-23 LAB — RESPIRATORY PANEL BY RT PCR (FLU A&B, COVID)
Influenza A by PCR: NEGATIVE
Influenza B by PCR: NEGATIVE
SARS Coronavirus 2 by RT PCR: NEGATIVE

## 2019-11-23 LAB — TROPONIN I (HIGH SENSITIVITY)
Troponin I (High Sensitivity): 12 ng/L (ref <18)
Troponin I (High Sensitivity): 12 ng/L (ref <18)

## 2019-11-23 LAB — BRAIN NATRIURETIC PEPTIDE: B Natriuretic Peptide: 581.6 pg/mL — ABNORMAL HIGH (ref 0.0–100.0)

## 2019-11-23 NOTE — ED Provider Notes (Addendum)
[facility] EMERGENCY DEPARTMENT Provider Note   CSN: [id number] Arrival date & time: 11/23/19  1503     History Chief Complaint  Patient presents with  . Headache  . Weakness    [name] is a 86 y.o. female.  Patient with more recent admissions to [facility] in [place].  Patient reported feeling weak on getting up this morning generalized weakness.  Developing a headache and worsening weakness throughout the day also endorsed a cough.  Denied any fevers or sick contacts.  Patient has had the Covid vaccine.  Headache is not severe.  Chart review shows that patient has a history of chronic diastolic heart failure followed by Daneen Schick.  Alcohol abuse stage III chronic kidney disease.  Hypertension coronary artery disease.  Patient had an admission at [facility] back in March.  She underwent heart cath for elevated troponins.  Showed occluded right coronary artery they recommended medical management.  Following that she had an ischemic stroke.  And then also went into acute kidney failure.  Patient was on Lasix it looks like that was stopped August 24 of this year.  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  Patient is also been seen by ear nose and throat for chronic hoarseness.  Which she is hoarse today.  Patient also has a history of hypertension.  And in the past has been poorly controlled.  Patient initially to me did not complain of headache.  On arrival patient was noted to be hypoxic.  Not on home oxygen.  But she was requiring up to 6 L of oxygen.  And oxygen sats were in the low 90s this raised concern for possible Covid infection so she was tested for that.        Past Medical History:  Diagnosis Date  . Anemia 04/03/2011  . Anginal pain ([place])    a. 02/2014 MV: Low risk; b. 05/2017 MV: small area of inferior basal ischemia. EF 54% - low risk-->Med mgmt.  Marland Kitchen Anxiety   . Aortic valve sclerosis w/ systolic murmur    a. 09/3265 AoV sclerosis w/o  stenosis.  . Arthritis    "feet" (06/02/2017)  . Blind left eye 1999  . CKD (chronic kidney disease) stage 3, GFR 30-59 ml/min   . Depression   . Diastolic dysfunction    a. 10/2018 Echo: EF 60-65%, mild septal/posterior LVH. Impaired relaxation. Nl RV size/fxn. Mod dil LA. Trace TR. Triv AI. Mod AoV sclerosis w/o stenosis. Nl PASP.  Marland Kitchen Exertional dyspnea   . GERD (gastroesophageal reflux disease)   . High cholesterol   . Hip fracture ([place]) 07/17/11   fall from 1-2 feet; left  . Hypertension   . Ischemic stroke ([place]) 05/2017   a. 06/2017 Holter: RSR, PACs, no afib.  . Prosthetic eye globe 1999   "from diabetic; left eye"  . Type II diabetes mellitus ([place])    not taking any medication (06/02/2017)    Patient Active Problem List   Diagnosis Date Noted  . Hemiparesis due to old stroke ([place]) 05/12/2019  . Tachycardia   . Coronary artery disease involving native coronary artery of native heart without angina pectoris   . Unstable angina ([place]) 04/08/2019  . HTN (hypertension) 04/07/2019  . HLD (hyperlipidemia) 04/07/2019  . Elevated troponin 04/07/2019  . AKI (acute kidney injury) ([place]) 11/01/2018  . Leukocytosis 02/22/2018  . Influenza A 02/21/2018  . Wheezy bronchitis 02/21/2018  . Acute wheezy bronchitis 02/21/2018  . Hypertension associated with  stage 3 chronic kidney disease due to type 2 diabetes mellitus ([place]) 06/09/2017  . Dyslipidemia associated with type 2 diabetes mellitus ([employer]) 06/09/2017  . Stage 3 chronic kidney disease due to type 2 diabetes mellitus ([place]) 06/09/2017  . Peripheral sensory neuropathy due to type 2 diabetes mellitus ([place]) 06/09/2017  . GERD without esophagitis 06/09/2017  . Glaucoma due to type 2 diabetes mellitus ([place]) 06/09/2017  . Alcohol abuse 06/04/2017  . Ischemic stroke ([place]) 06/02/2017  . Type 2 diabetes mellitus with vascular disease ([place]) 06/02/2017  . Chest pain 06/02/2017  . Closed non-physeal fracture of metatarsal bone of right foot  07/31/2016  . Hypertensive urgency 02/17/2014  . Chronic diastolic heart failure ([place]) 03/15/2013  . Closed left hip fracture ([place]) 07/17/2011    Past Surgical History:  Procedure Laterality Date  . ENUCLEATION Left   . FRACTURE SURGERY    . HIP ARTHROPLASTY  07/18/2011   Procedure: ARTHROPLASTY BIPOLAR HIP;  Surgeon: Nita Sells, MD;  Location: [facility];  Service: Orthopedics;  Laterality: Left;  . INTRAOCULAR PROSTHESES INSERTION  1999   left  . LEFT HEART CATH AND CORS/GRAFTS ANGIOGRAPHY N/A 04/08/2019   Procedure: LEFT HEART CATH AND CORS/GRAFTS ANGIOGRAPHY;  Surgeon: Minna Merritts, MD;  Location: [facility];  Service: Cardiovascular;  Laterality: N/A;  . [place]   "cancer"  . TUBAL LIGATION  1970's     OB History   No obstetric history on file.     Family History  Problem Relation Age of Onset  . Diabetes type II Mother   . Diabetes type II Father   . Diabetes type II Sister   . Colon cancer Neg Hx     Social History   Tobacco Use  . Smoking status: Former Smoker    Packs/day: 1.50    Years: 47.00    Pack years: 70.50    Types: Cigarettes    Quit date: 09/13/1997    Years since quitting: 22.2  . Smokeless tobacco: Never Used  Vaping Use  . Vaping Use: Never used  Substance Use Topics  . Alcohol use: Not Currently    Alcohol/week: 17.0 standard drinks    Types: 17 Shots of liquor per week    Comment: Quit 03/2018  . Drug use: No    Types: Marijuana, Heroin    Comment: 06/02/2017  "last drug use was in the 1980s." Prev injected heroin.    Home Medications Prior to Admission medications   Medication Sig Start Date End Date Taking? Authorizing Provider  acetaminophen (TYLENOL) 325 MG tablet Take 2 tablets (650 mg total) by mouth every 6 (six) hours as needed for mild pain or fever (or temp > 37.5 C (99.5 F)). 06/05/17   Elgergawy, Silver Huguenin, MD  ALPHAGAN P 0.1 % SOLN Place 1 drop into the right eye 2 (two) times daily. 10/10/15    [provider]  amLODipine (NORVASC) 10 MG tablet Take 1 tablet (10 mg total) by mouth daily. 06/09/19 09/07/19  Darliss Cheney, MD  aspirin EC 81 MG tablet Take 81 mg by mouth daily.    [provider]  atorvastatin (LIPITOR) 40 MG tablet Take 1 tablet (40 mg total) by mouth daily at 6 PM. 06/08/19   Darliss Cheney, MD  carvedilol (COREG) 12.5 MG tablet Take 1 tablet (12.5 mg total) by mouth 2 (two) times daily with a meal. 06/08/19 09/06/19  Darliss Cheney, MD  folic acid (FOLVITE) 1 MG tablet Take 1  tablet (1 mg total) by mouth daily. 06/06/17   Elgergawy, Silver Huguenin, MD  furosemide (LASIX) 40 MG tablet Take 1 tablet (40 mg total) by mouth daily. 06/08/19 09/06/19  Darliss Cheney, MD  gabapentin (NEURONTIN) 300 MG capsule Take 300 mg by mouth 3 (three) times daily.     [provider]  hydrALAZINE (APRESOLINE) 10 MG tablet TAKE 1 TABLET(10 MG) BY MOUTH THREE TIMES DAILY 08/01/19   Belva Crome, MD  isosorbide mononitrate (IMDUR) 60 MG 24 hr tablet Take 1 tablet (60 mg total) by mouth 2 (two) times daily. 06/08/19 07/08/19  Darliss Cheney, MD  Multiple Vitamin (MULTIVITAMIN WITH MINERALS) TABS tablet Take 1 tablet by mouth daily. 06/06/17   Elgergawy, Silver Huguenin, MD  nitroGLYCERIN (NITROSTAT) 0.4 MG SL tablet Place 1 tablet (0.4 mg total) under the tongue every 5 (five) minutes as needed for chest pain. 02/11/19 06/07/19  Isaiah Serge, NP  omeprazole (PRILOSEC) 20 MG capsule Take 1 capsule (20 mg total) by mouth daily. 02/11/19   Isaiah Serge, NP  TRAVATAN Z 0.004 % SOLN ophthalmic solution Place 1 drop into the right eye at bedtime.  11/24/12   [provider]    Allergies    Patient has no known allergies.  Review of Systems   Review of Systems  Constitutional: Positive for fatigue. Negative for chills and fever.  HENT: Positive for congestion and voice change. Negative for rhinorrhea, sore throat and trouble swallowing.   Eyes: Positive for visual disturbance.   Respiratory: Positive for cough. Negative for shortness of breath.   Cardiovascular: Negative for chest pain and leg swelling.  Gastrointestinal: Negative for abdominal pain, diarrhea, nausea and vomiting.  Genitourinary: Negative for dysuria.  Musculoskeletal: Negative for back pain and neck pain.  Skin: Negative for rash.  Neurological: Positive for weakness and headaches. Negative for dizziness and light-headedness.  Hematological: Does not bruise/bleed easily.  Psychiatric/Behavioral: Negative for confusion.    Physical Exam Updated Vital Signs BP 134/70   Pulse 84   Temp 98.4 F (36.9 C) (Oral)   Resp (!) 25   SpO2 93%   Physical Exam Vitals and nursing note reviewed.  Constitutional:      General: She is not in acute distress.    Appearance: Normal appearance. She is well-developed.  HENT:     Head: Normocephalic and atraumatic.     Mouth/Throat:     Mouth: Mucous membranes are dry.  Eyes:     Conjunctiva/sclera: Conjunctivae normal.     Comments: No vision in the left eye.  Cardiovascular:     Rate and Rhythm: Normal rate and regular rhythm.     Heart sounds: No murmur heard.   Pulmonary:     Effort: Pulmonary effort is normal. No respiratory distress.     Breath sounds: Normal breath sounds. No wheezing or rales.     Comments: No wheezing no rhonchi no rales. Chest:     Chest wall: No tenderness.  Abdominal:     Palpations: Abdomen is soft.     Tenderness: There is no abdominal tenderness.  Musculoskeletal:        General: No swelling. Normal range of motion.     Cervical back: Neck supple.  Skin:    General: Skin is warm and dry.     Capillary Refill: Capillary refill takes less than 2 seconds.  Neurological:     General: No focal deficit present.     Mental Status: She is alert and oriented  to person, place, and time. Mental status is at baseline.     Cranial Nerves: Cranial nerve deficit present.     Sensory: No sensory deficit.     Motor: No  weakness.     Comments: Patient with good movement of all extremities. Little bit of weakness to the left lower extremity. Which patient states is baseline. But patient able to lift it up off the bed.     ED Results / Procedures / Treatments   Labs (all labs ordered are listed, but only abnormal results are displayed) Labs Reviewed  BASIC METABOLIC PANEL - Abnormal; Notable for the following components:      Result Value   Glucose, Bld 210 (*)    Creatinine, Ser 1.76 (*)    GFR, Estimated 28 (*)    All other components within normal limits  CBC - Abnormal; Notable for the following components:   Hemoglobin 11.2 (*)    RDW 17.8 (*)    All other components within normal limits  BRAIN NATRIURETIC PEPTIDE - Abnormal; Notable for the following components:   B Natriuretic Peptide 581.6 (*)    All other components within normal limits  RESPIRATORY PANEL BY RT PCR (FLU A&B, COVID)  URINALYSIS, ROUTINE W REFLEX MICROSCOPIC  CBG MONITORING, ED  TROPONIN I (HIGH SENSITIVITY)  TROPONIN I (HIGH SENSITIVITY)    EKG EKG Interpretation  Date/Time:  Wednesday November 23 2019 15:11:53 EST Ventricular Rate:  89 PR Interval:  166 QRS Duration: 92 QT Interval:  396 QTC Calculation: 481 R Axis:   -18 Text Interpretation: Sinus tachycardia with Blocked Premature atrial complexes Moderate voltage criteria for LVH, may be normal variant ( R in aVL , Cornell product ) ST & T wave abnormality, consider lateral ischemia Prolonged QT Abnormal ECG Confirmed by Fredia Sorrow [phone]) on 11/23/2019 4:58:51 PM   Radiology CT Chest Wo Contrast  Result Date: 11/23/2019 CLINICAL DATA:  86 year old female with chest pain, shortness of breath, weakness. Cough. EXAM: CT CHEST WITHOUT CONTRAST TECHNIQUE: Multidetector CT imaging of the chest was performed following the standard protocol without IV contrast. COMPARISON:  Portable chest today.  Chest CT 06/05/2009 and earlier. FINDINGS: Cardiovascular:  Calcified coronary artery and aortic atherosclerosis. Stable cardiac size, borderline to mild cardiomegaly. No pericardial effusion. Stable tortuosity of the aorta. Tortuous brachiocephalic artery at the right thoracic inlet and abutting the thyroid in the midline. Vascular patency is not evaluated in the absence of IV contrast. Mediastinum/Nodes: No mediastinal or hilar lymphadenopathy is evident in the absence of contrast. Lungs/Pleura: Stable lung volumes compared to the 2011 CTA. Atelectatic changes to the lower lobe airways. Superimposed bilateral lower lobe bronchial wall thickening. No consolidation or pleural effusion. Mild dependent atelectasis and other crowding of lung base markings. Upper Abdomen: Abdominal calcified atherosclerosis. Negative visible noncontrast liver, spleen, pancreas, adrenal glands, kidneys, and bowel in the upper abdomen. Musculoskeletal: Mild chronic T3 compression fracture is stable since 2011. However, T4, T5 and T7 compression fractures are new since that time. These are age indeterminate but favored to be chronic. No retropulsion or complicating features. No other acute osseous abnormality identified. IMPRESSION: 1. Atelectasis with superimposed lower lobe airway thickening. Consider bronchitis. 2. Age indeterminate T4, T5 and T7 compression fractures are new since 2011. If there is acute back pain and specific therapy such as vertebroplasty is desired, Lumbar MRI or Nuclear Medicine Whole-body Bone Scan would best determine acuity. 3. Calcified coronary artery and Aortic Atherosclerosis (ICD10-I70.0). Electronically Signed   By: Genevie Ann  M.D.   On: 11/23/2019 20:09   DG Chest Port 1 View  Result Date: 11/23/2019 CLINICAL DATA:  Weakness and cough EXAM: PORTABLE CHEST 1 VIEW COMPARISON:  None. FINDINGS: The heart size and mediastinal contours are within normal limits. The aortic knob calcifications are seen. Both lungs are clear. The visualized skeletal structures are  unremarkable. IMPRESSION: No active disease. Electronically Signed   By: Prudencio Pair M.D.   On: 11/23/2019 17:50    Procedures Procedures (including critical care time)  Medications Ordered in ED Medications - No data to display  ED Course  I have reviewed the triage vital signs and the nursing notes.  Pertinent labs & imaging results that were available during my care of the patient were reviewed by me and considered in my medical decision making (see chart for details).    MDM Rules/Calculators/A&P                          Patient's work-up was mostly trying to explain the hypoxia.  Troponins without significant change.  Chest x-ray was negative BNP elevated a little bit at 581 but patient's been higher than that.  Patient due to her chronic renal disease not a candidate for CT angio.  So did plain CT chest.  Was does not show signs of pneumonia or any significant pulmonary edema.  But patient still has the unexplained hypoxia.  Patient's had strokes in the past she is kind of baseline from that.  Nothing new or worse it appears from chart review that her hoarseness is somewhat chronic and has been seen by ear nose and throat for that.  She will require admission for the oxygen requirement.  Patient is not hypertensive here.  Patient may require VQ scan for further evaluation for the hypoxia.   Final Clinical Impression(s) / ED Diagnoses Final diagnoses:  Hypoxia  Hoarseness of voice    Rx / DC Orders ED Discharge Orders    None       Fredia Sorrow, MD 11/23/19 [id number]    Fredia Sorrow, MD 11/23/19 2312

## 2019-11-23 NOTE — ED Triage Notes (Signed)
Pt reports feeling weak when she got up this morning, developing headache and worsening weakness throughout the day, also endorses cough. Denies fevers or sick contacts.

## 2019-11-23 NOTE — H&P (Signed)
History and Physical    [name] PIR:[id number] DOB: 05-06-1932 DOA: 11/23/2019  PCP: Dierdre Harness, FNP  Patient coming from: Home  I have personally briefly reviewed patient's old medical records in [place]  Chief Complaint: Shortness of breath  HPI: [name] is a 86 y.o. female with medical history significant for chronic diastolic CHF (EF 63-01% by TTE 04/12/2019), CKD stage IIIb, severe one-vessel CAD with chronic angina, history of CVA, diet-controlled T2DM, HTN, HLD, chronic hoarseness, and blindness of the left eye who presents to the ED for evaluation of cough and shortness of breath.  Patient states she has been having ongoing chronic cough productive of clear sputum.  Her voice has been hoarse and she has been evaluated by ENT with laryngoscopy which showed glottic edema with small vocal cord nodules.  Patient states today she began to have shortness of breath with continued cough which has not really changed.  She has not had any hemoptysis.  She says trouble breathing began when she was standing up and does not specify any aggravating or alleviating factors.  She otherwise denies any chest pain, nausea, vomiting, abdominal pain, dysuria.  ED Course:  Initial vitals showed BP 102/64, pulse 83, RR 14, temp 98.4 Fahrenheit, SPO2 90% on room air.  Per ED documentation, SPO2 dropped to low 80s while on room air and patient was placed on 6 L supplemental O2 via [place].  Labs show WBC 5.6, hemoglobin 11.2, platelets 271,000, sodium 139, potassium 3.5, bicarb 23, BUN 14, creatinine 1.76, serum glucose 210.  BNP 581.6.  High-sensitivity troponin I 12x2.  SARS-CoV-2 PCR is negative.  Influenza A/B PCR's are negative.  Portable chest x-ray is negative for focal consolidation, edema, or effusion.  CT chest without contrast shows atelectasis with superimposed lower lobe airway thickening.  Age-indeterminate T4, T5, and T7 compression fractures are seen, new when  compared to prior imaging from 2011.  The hospitalist service was consulted to admit for further evaluation and management of hypoxia.  Review of Systems: All systems reviewed and are negative except as documented in history of present illness above.   Past Medical History:  Diagnosis Date  . Anemia 04/03/2011  . Anginal pain ([place])    a. 02/2014 MV: Low risk; b. 05/2017 MV: small area of inferior basal ischemia. EF 54% - low risk-->Med mgmt.  Marland Kitchen Anxiety   . Aortic valve sclerosis w/ systolic murmur    a. 60/1093 AoV sclerosis w/o stenosis.  . Arthritis    "feet" (06/02/2017)  . Blind left eye 1999  . CKD (chronic kidney disease) stage 3, GFR 30-59 ml/min   . Depression   . Diastolic dysfunction    a. 10/2018 Echo: EF 60-65%, mild septal/posterior LVH. Impaired relaxation. Nl RV size/fxn. Mod dil LA. Trace TR. Triv AI. Mod AoV sclerosis w/o stenosis. Nl PASP.  Marland Kitchen Exertional dyspnea   . GERD (gastroesophageal reflux disease)   . High cholesterol   . Hip fracture ([place]) 07/17/11   fall from 1-2 feet; left  . Hypertension   . Ischemic stroke ([place]) 05/2017   a. 06/2017 Holter: RSR, PACs, no afib.  . Prosthetic eye globe 1999   "from diabetic; left eye"  . Type II diabetes mellitus ([place])    not taking any medication (06/02/2017)    Past Surgical History:  Procedure Laterality Date  . ENUCLEATION Left   . FRACTURE SURGERY    . HIP ARTHROPLASTY  07/18/2011   Procedure: ARTHROPLASTY BIPOLAR HIP;  Surgeon: Larkin Ina  Caffie Damme, MD;  Location: [facility];  Service: Orthopedics;  Laterality: Left;  . INTRAOCULAR PROSTHESES INSERTION  1999   left  . LEFT HEART CATH AND CORS/GRAFTS ANGIOGRAPHY N/A 04/08/2019   Procedure: LEFT HEART CATH AND CORS/GRAFTS ANGIOGRAPHY;  Surgeon: Minna Merritts, MD;  Location: [facility];  Service: Cardiovascular;  Laterality: N/A;  . [place]   "cancer"  . TUBAL LIGATION  1970's    Social History:  reports that she quit smoking about 22  years ago. Her smoking use included cigarettes. She has a 70.50 pack-year smoking history. She has never used smokeless tobacco. She reports previous alcohol use of about 17.0 standard drinks of alcohol per week. She reports that she does not use drugs.  No Known Allergies  Family History  Problem Relation Age of Onset  . Diabetes type II Mother   . Diabetes type II Father   . Diabetes type II Sister   . Colon cancer Neg Hx      Prior to Admission medications   Medication Sig Start Date End Date Taking? Authorizing Provider  acetaminophen (TYLENOL) 325 MG tablet Take 2 tablets (650 mg total) by mouth every 6 (six) hours as needed for mild pain or fever (or temp > 37.5 C (99.5 F)). 06/05/17   Elgergawy, Silver Huguenin, MD  ALPHAGAN P 0.1 % SOLN Place 1 drop into the right eye 2 (two) times daily. 10/10/15   [provider]  amLODipine (NORVASC) 10 MG tablet Take 1 tablet (10 mg total) by mouth daily. 06/09/19 09/07/19  Darliss Cheney, MD  aspirin EC 81 MG tablet Take 81 mg by mouth daily.    [provider]  atorvastatin (LIPITOR) 40 MG tablet Take 1 tablet (40 mg total) by mouth daily at 6 PM. 06/08/19   Darliss Cheney, MD  carvedilol (COREG) 12.5 MG tablet Take 1 tablet (12.5 mg total) by mouth 2 (two) times daily with a meal. 06/08/19 09/06/19  Darliss Cheney, MD  folic acid (FOLVITE) 1 MG tablet Take 1 tablet (1 mg total) by mouth daily. 06/06/17   Elgergawy, Silver Huguenin, MD  furosemide (LASIX) 40 MG tablet Take 1 tablet (40 mg total) by mouth daily. 06/08/19 09/06/19  Darliss Cheney, MD  gabapentin (NEURONTIN) 300 MG capsule Take 300 mg by mouth 3 (three) times daily.     [provider]  hydrALAZINE (APRESOLINE) 10 MG tablet TAKE 1 TABLET(10 MG) BY MOUTH THREE TIMES DAILY 08/01/19   Belva Crome, MD  isosorbide mononitrate (IMDUR) 60 MG 24 hr tablet Take 1 tablet (60 mg total) by mouth 2 (two) times daily. 06/08/19 07/08/19  Darliss Cheney, MD  Multiple Vitamin (MULTIVITAMIN WITH  MINERALS) TABS tablet Take 1 tablet by mouth daily. 06/06/17   Elgergawy, Silver Huguenin, MD  nitroGLYCERIN (NITROSTAT) 0.4 MG SL tablet Place 1 tablet (0.4 mg total) under the tongue every 5 (five) minutes as needed for chest pain. 02/11/19 06/07/19  Isaiah Serge, NP  omeprazole (PRILOSEC) 20 MG capsule Take 1 capsule (20 mg total) by mouth daily. 02/11/19   Isaiah Serge, NP  TRAVATAN Z 0.004 % SOLN ophthalmic solution Place 1 drop into the right eye at bedtime.  11/24/12   [provider]    Physical Exam: Vitals:   11/23/19 1800 11/23/19 1815 11/23/19 1830 11/23/19 1845  BP: (!) 97/58 122/65 127/67 134/70  Pulse: 84 82 82 84  Resp: 18 19 (!) 21 (!) 25  Temp:  TempSrc:      SpO2: (!) 87% (!) 88% 92% 93%   Constitutional: Chronically ill-appearing elderly woman resting supine in bed, NAD, calm, comfortable Eyes: Right pupil round and reactive, left prosthetic eye ENMT: Hoarse voice.  Mucous membranes are dry. Posterior pharynx clear of any exudate or lesions.Normal dentition.  Neck: normal, supple, no masses. Respiratory: clear to auscultation bilaterally, no wheezing, no crackles. Normal respiratory effort. No accessory muscle use.  Cardiovascular: Regular rate and rhythm, systolic murmur present. No extremity edema. 2+ pedal pulses. Abdomen: no tenderness, no masses palpated. No hepatosplenomegaly. Bowel sounds positive.  Musculoskeletal: no clubbing / cyanosis. No joint deformity upper and lower extremities. Good ROM, no contractures. Normal muscle tone.  Skin: no rashes, lesions, ulcers. No induration Neurologic: CN 2-12 grossly intact. Sensation intact, Strength 5/5 in all 4.  Psychiatric: Normal judgment and insight. Alert and oriented x 3. Normal mood.    Labs on Admission: I have personally reviewed following labs and imaging studies  CBC: Recent Labs  Lab 11/23/19 1513  WBC 5.6  HGB 11.2*  HCT 36.2  MCV 90.7  PLT 073   Basic Metabolic Panel: Recent Labs   Lab 11/23/19 1513  NA 139  K 3.5  CL 104  CO2 23  GLUCOSE 210*  BUN 14  CREATININE 1.76*  CALCIUM 9.0   GFR: CrCl cannot be calculated (Unknown ideal weight.). Liver Function Tests: No results for input(s): AST, ALT, ALKPHOS, BILITOT, PROT, ALBUMIN in the last 168 hours. No results for input(s): LIPASE, AMYLASE in the last 168 hours. No results for input(s): AMMONIA in the last 168 hours. Coagulation Profile: No results for input(s): INR, PROTIME in the last 168 hours. Cardiac Enzymes: No results for input(s): CKTOTAL, CKMB, CKMBINDEX, TROPONINI in the last 168 hours. BNP (last 3 results) No results for input(s): PROBNP in the last 8760 hours. HbA1C: No results for input(s): HGBA1C in the last 72 hours. CBG: No results for input(s): GLUCAP in the last 168 hours. Lipid Profile: No results for input(s): CHOL, HDL, LDLCALC, TRIG, CHOLHDL, LDLDIRECT in the last 72 hours. Thyroid Function Tests: No results for input(s): TSH, T4TOTAL, FREET4, T3FREE, THYROIDAB in the last 72 hours. Anemia Panel: No results for input(s): VITAMINB12, FOLATE, FERRITIN, TIBC, IRON, RETICCTPCT in the last 72 hours. Urine analysis:    Component Value Date/Time   COLORURINE STRAW (A) 06/06/2019 1351   APPEARANCEUR CLEAR 06/06/2019 1351   LABSPEC 1.004 (L) 06/06/2019 1351   PHURINE 7.0 06/06/2019 1351   GLUCOSEU NEGATIVE 06/06/2019 1351   HGBUR NEGATIVE 06/06/2019 1351   BILIRUBINUR NEGATIVE 06/06/2019 1351   KETONESUR NEGATIVE 06/06/2019 1351   PROTEINUR NEGATIVE 06/06/2019 1351   UROBILINOGEN 0.2 02/07/2014 2241   NITRITE NEGATIVE 06/06/2019 1351   LEUKOCYTESUR NEGATIVE 06/06/2019 1351    Radiological Exams on Admission: CT Chest Wo Contrast  Result Date: 11/23/2019 CLINICAL DATA:  86 year old female with chest pain, shortness of breath, weakness. Cough. EXAM: CT CHEST WITHOUT CONTRAST TECHNIQUE: Multidetector CT imaging of the chest was performed following the standard protocol without IV  contrast. COMPARISON:  Portable chest today.  Chest CT 06/05/2009 and earlier. FINDINGS: Cardiovascular: Calcified coronary artery and aortic atherosclerosis. Stable cardiac size, borderline to mild cardiomegaly. No pericardial effusion. Stable tortuosity of the aorta. Tortuous brachiocephalic artery at the right thoracic inlet and abutting the thyroid in the midline. Vascular patency is not evaluated in the absence of IV contrast. Mediastinum/Nodes: No mediastinal or hilar lymphadenopathy is evident in the absence of contrast. Lungs/Pleura: Stable lung  volumes compared to the 2011 CTA. Atelectatic changes to the lower lobe airways. Superimposed bilateral lower lobe bronchial wall thickening. No consolidation or pleural effusion. Mild dependent atelectasis and other crowding of lung base markings. Upper Abdomen: Abdominal calcified atherosclerosis. Negative visible noncontrast liver, spleen, pancreas, adrenal glands, kidneys, and bowel in the upper abdomen. Musculoskeletal: Mild chronic T3 compression fracture is stable since 2011. However, T4, T5 and T7 compression fractures are new since that time. These are age indeterminate but favored to be chronic. No retropulsion or complicating features. No other acute osseous abnormality identified. IMPRESSION: 1. Atelectasis with superimposed lower lobe airway thickening. Consider bronchitis. 2. Age indeterminate T4, T5 and T7 compression fractures are new since 2011. If there is acute back pain and specific therapy such as vertebroplasty is desired, Lumbar MRI or Nuclear Medicine Whole-body Bone Scan would best determine acuity. 3. Calcified coronary artery and Aortic Atherosclerosis (ICD10-I70.0). Electronically Signed   By: Genevie Ann M.D.   On: 11/23/2019 20:09   DG Chest Port 1 View  Result Date: 11/23/2019 CLINICAL DATA:  Weakness and cough EXAM: PORTABLE CHEST 1 VIEW COMPARISON:  None. FINDINGS: The heart size and mediastinal contours are within normal limits. The  aortic knob calcifications are seen. Both lungs are clear. The visualized skeletal structures are unremarkable. IMPRESSION: No active disease. Electronically Signed   By: Prudencio Pair M.D.   On: 11/23/2019 17:50    EKG: Personally reviewed. Sinus rhythm with PACs.  PACs new when compared to prior.  Assessment/Plan Principal Problem:   Hypoxia Active Problems:   Chronic diastolic heart failure (HCC)   Hypertension associated with stage 3 chronic kidney disease due to type 2 diabetes mellitus (HCC)   Stage 3 chronic kidney disease due to type 2 diabetes mellitus ([place])   Coronary artery disease involving native coronary artery of native heart without angina pectoris  Harneet Noblett is a 86 y.o. female with medical history significant for chronic diastolic CHF (EF 97-67% by TTE 04/12/2019), CKD stage IIIb, severe one-vessel CAD with chronic angina, history of CVA, diet-controlled T2DM, HTN, HLD, chronic hoarseness, and blindness of the left eye who is admitted for evaluation of hypoxia.  Hypoxia: Without obvious etiology.  No evidence of pneumonia on CT chest.  CTA PE study not obtained due to renal dysfunction.  Has history of CHF but appears volume depleted on admission without pulmonary edema on imaging.  Clearly related to atelectasis and bronchitis. -Continue supplemental O2 as needed, requiring 3 L via [place] on admission -Start incentive spirometer and flutter valve -Check D-dimer, if positive consider V/Q scan and/or lower extremity Dopplers  CKD stage IIIb: Renal function slightly worse compared to recent baseline.  Appears fine completed on admission.  Will start on gentle IV fluids with NS@100  mL/hour overnight and repeat labs in a.m.  Chronic diastolic CHF: EF 34-19% by TTE 04/12/2019..  Volume depleted on admission.  Holding Lasix and placed on IV fluid hydration as above.  Continue Coreg.  CAD with chronic angina: Chronic and stable.  Continue aspirin, Coreg, atorvastatin,  Imdur.  History of CVA: Continue aspirin, statin.  Diet-controlled type 2 diabetes: Place on sensitive SSI for now and check A1c.  Hypertension: Currently stable.  Continue home amlodipine, Coreg, Imdur.  Holding home hydralazine for now.  Hyperlipidemia: Continue atorvastatin.  T4, T5, T7 compression fractures: Seen on CT imaging of indeterminate age.  Patient denies any current related back pain.  DVT prophylaxis: Subcutaneous heparin Code Status: DNR, confirmed with patient Family Communication: Discussed with  patient, she has discussed with family Disposition Plan: From home and likely discharge to home pending further evaluation of hypoxia Consults called: None Admission status:  Status is: Observation  The patient remains OBS appropriate and will d/c before 2 midnights.  Dispo: The patient is from: Home              Anticipated d/c is to: Home              Anticipated d/c date is: 1 day              Patient currently is not medically stable to d/c.   Zada Finders MD [facility]  If 7PM-7AM, please contact night-coverage [email or web address]  11/23/2019, 11:57 PM

## 2019-11-23 NOTE — ED Notes (Signed)
Pt continues to desat into low 80s. Instructed pt to take deep breaths through nose and O2 levels increase.   Pt denies feeling SOB

## 2019-11-23 NOTE — ED Notes (Signed)
Titrated pt's O2 to [facility] to maintain sat of 94%

## 2019-11-24 ENCOUNTER — Observation Stay (HOSPITAL_COMMUNITY): Payer: Medicare HMO

## 2019-11-24 ENCOUNTER — Inpatient Hospital Stay (HOSPITAL_COMMUNITY): Payer: Medicare HMO

## 2019-11-24 DIAGNOSIS — X58XXXA Exposure to other specified factors, initial encounter: Secondary | ICD-10-CM | POA: Diagnosis present

## 2019-11-24 DIAGNOSIS — H5462 Unqualified visual loss, left eye, normal vision right eye: Secondary | ICD-10-CM | POA: Diagnosis present

## 2019-11-24 DIAGNOSIS — M4854XA Collapsed vertebra, not elsewhere classified, thoracic region, initial encounter for fracture: Secondary | ICD-10-CM | POA: Diagnosis present

## 2019-11-24 DIAGNOSIS — Z20822 Contact with and (suspected) exposure to covid-19: Secondary | ICD-10-CM | POA: Diagnosis present

## 2019-11-24 DIAGNOSIS — I130 Hypertensive heart and chronic kidney disease with heart failure and stage 1 through stage 4 chronic kidney disease, or unspecified chronic kidney disease: Secondary | ICD-10-CM | POA: Diagnosis present

## 2019-11-24 DIAGNOSIS — E1122 Type 2 diabetes mellitus with diabetic chronic kidney disease: Secondary | ICD-10-CM

## 2019-11-24 DIAGNOSIS — I2510 Atherosclerotic heart disease of native coronary artery without angina pectoris: Secondary | ICD-10-CM

## 2019-11-24 DIAGNOSIS — Z794 Long term (current) use of insulin: Secondary | ICD-10-CM | POA: Diagnosis not present

## 2019-11-24 DIAGNOSIS — J9621 Acute and chronic respiratory failure with hypoxia: Secondary | ICD-10-CM | POA: Diagnosis present

## 2019-11-24 DIAGNOSIS — R0902 Hypoxemia: Secondary | ICD-10-CM | POA: Diagnosis not present

## 2019-11-24 DIAGNOSIS — J690 Pneumonitis due to inhalation of food and vomit: Secondary | ICD-10-CM | POA: Diagnosis present

## 2019-11-24 DIAGNOSIS — Z970 Presence of artificial eye: Secondary | ICD-10-CM | POA: Diagnosis not present

## 2019-11-24 DIAGNOSIS — K224 Dyskinesia of esophagus: Secondary | ICD-10-CM | POA: Diagnosis present

## 2019-11-24 DIAGNOSIS — K219 Gastro-esophageal reflux disease without esophagitis: Secondary | ICD-10-CM | POA: Diagnosis present

## 2019-11-24 DIAGNOSIS — N1832 Chronic kidney disease, stage 3b: Secondary | ICD-10-CM | POA: Diagnosis present

## 2019-11-24 DIAGNOSIS — Z8673 Personal history of transient ischemic attack (TIA), and cerebral infarction without residual deficits: Secondary | ICD-10-CM | POA: Diagnosis not present

## 2019-11-24 DIAGNOSIS — J9601 Acute respiratory failure with hypoxia: Secondary | ICD-10-CM | POA: Diagnosis not present

## 2019-11-24 DIAGNOSIS — Z66 Do not resuscitate: Secondary | ICD-10-CM | POA: Diagnosis present

## 2019-11-24 DIAGNOSIS — Z87891 Personal history of nicotine dependence: Secondary | ICD-10-CM | POA: Diagnosis not present

## 2019-11-24 DIAGNOSIS — J382 Nodules of vocal cords: Secondary | ICD-10-CM | POA: Diagnosis present

## 2019-11-24 DIAGNOSIS — R7989 Other specified abnormal findings of blood chemistry: Secondary | ICD-10-CM | POA: Diagnosis not present

## 2019-11-24 DIAGNOSIS — N183 Chronic kidney disease, stage 3 unspecified: Secondary | ICD-10-CM

## 2019-11-24 DIAGNOSIS — I129 Hypertensive chronic kidney disease with stage 1 through stage 4 chronic kidney disease, or unspecified chronic kidney disease: Secondary | ICD-10-CM

## 2019-11-24 DIAGNOSIS — I5032 Chronic diastolic (congestive) heart failure: Secondary | ICD-10-CM

## 2019-11-24 DIAGNOSIS — R490 Dysphonia: Secondary | ICD-10-CM

## 2019-11-24 DIAGNOSIS — E785 Hyperlipidemia, unspecified: Secondary | ICD-10-CM | POA: Diagnosis present

## 2019-11-24 DIAGNOSIS — Z7982 Long term (current) use of aspirin: Secondary | ICD-10-CM | POA: Diagnosis not present

## 2019-11-24 DIAGNOSIS — E7800 Pure hypercholesterolemia, unspecified: Secondary | ICD-10-CM | POA: Diagnosis present

## 2019-11-24 DIAGNOSIS — I25119 Atherosclerotic heart disease of native coronary artery with unspecified angina pectoris: Secondary | ICD-10-CM | POA: Diagnosis present

## 2019-11-24 DIAGNOSIS — E876 Hypokalemia: Secondary | ICD-10-CM | POA: Diagnosis present

## 2019-11-24 DIAGNOSIS — Z96642 Presence of left artificial hip joint: Secondary | ICD-10-CM | POA: Diagnosis present

## 2019-11-24 DIAGNOSIS — Z833 Family history of diabetes mellitus: Secondary | ICD-10-CM | POA: Diagnosis not present

## 2019-11-24 LAB — HEMOGLOBIN A1C
Hgb A1c MFr Bld: 5.7 % — ABNORMAL HIGH (ref 4.8–5.6)
Mean Plasma Glucose: 116.89 mg/dL

## 2019-11-24 LAB — URINALYSIS, ROUTINE W REFLEX MICROSCOPIC
Bilirubin Urine: NEGATIVE
Glucose, UA: NEGATIVE mg/dL
Hgb urine dipstick: NEGATIVE
Ketones, ur: NEGATIVE mg/dL
Nitrite: NEGATIVE
Protein, ur: >=300 mg/dL — AB
Specific Gravity, Urine: 1.016 (ref 1.005–1.030)
pH: 5 (ref 5.0–8.0)

## 2019-11-24 LAB — I-STAT ARTERIAL BLOOD GAS, ED
Acid-Base Excess: 3 mmol/L — ABNORMAL HIGH (ref 0.0–2.0)
Bicarbonate: 27.1 mmol/L (ref 20.0–28.0)
Calcium, Ion: 1.22 mmol/L (ref 1.15–1.40)
HCT: 32 % — ABNORMAL LOW (ref 36.0–46.0)
Hemoglobin: 10.9 g/dL — ABNORMAL LOW (ref 12.0–15.0)
O2 Saturation: 95 %
Potassium: 3.4 mmol/L — ABNORMAL LOW (ref 3.5–5.1)
Sodium: 143 mmol/L (ref 135–145)
TCO2: 28 mmol/L (ref 22–32)
pCO2 arterial: 39.8 mmHg (ref 32.0–48.0)
pH, Arterial: 7.441 (ref 7.350–7.450)
pO2, Arterial: 73 mmHg — ABNORMAL LOW (ref 83.0–108.0)

## 2019-11-24 LAB — GLUCOSE, CAPILLARY
Glucose-Capillary: 109 mg/dL — ABNORMAL HIGH (ref 70–99)
Glucose-Capillary: 92 mg/dL (ref 70–99)

## 2019-11-24 LAB — CBC
HCT: 34.2 % — ABNORMAL LOW (ref 36.0–46.0)
Hemoglobin: 10.7 g/dL — ABNORMAL LOW (ref 12.0–15.0)
MCH: 27.8 pg (ref 26.0–34.0)
MCHC: 31.3 g/dL (ref 30.0–36.0)
MCV: 88.8 fL (ref 80.0–100.0)
Platelets: 242 10*3/uL (ref 150–400)
RBC: 3.85 MIL/uL — ABNORMAL LOW (ref 3.87–5.11)
RDW: 17.6 % — ABNORMAL HIGH (ref 11.5–15.5)
WBC: 6.1 10*3/uL (ref 4.0–10.5)
nRBC: 0 % (ref 0.0–0.2)

## 2019-11-24 LAB — BASIC METABOLIC PANEL
Anion gap: 8 (ref 5–15)
BUN: 15 mg/dL (ref 8–23)
CO2: 26 mmol/L (ref 22–32)
Calcium: 8.9 mg/dL (ref 8.9–10.3)
Chloride: 108 mmol/L (ref 98–111)
Creatinine, Ser: 1.8 mg/dL — ABNORMAL HIGH (ref 0.44–1.00)
GFR, Estimated: 27 mL/min — ABNORMAL LOW (ref >60)
Glucose, Bld: 85 mg/dL (ref 70–99)
Potassium: 3.3 mmol/L — ABNORMAL LOW (ref 3.5–5.1)
Sodium: 142 mmol/L (ref 135–145)

## 2019-11-24 LAB — D-DIMER, QUANTITATIVE: D-Dimer, Quant: 1.98 ug/mL-FEU — ABNORMAL HIGH (ref 0.00–0.50)

## 2019-11-24 LAB — CBG MONITORING, ED
Glucose-Capillary: 95 mg/dL (ref 70–99)
Glucose-Capillary: 98 mg/dL (ref 70–99)
Glucose-Capillary: 88 mg/dL (ref 70–99)

## 2019-11-24 LAB — ECHOCARDIOGRAM COMPLETE

## 2019-11-24 MED ORDER — ONDANSETRON HCL 4 MG PO TABS: 4.0000 mg | ORAL_TABLET | Freq: Four times a day (QID) | ORAL | Status: DC | PRN

## 2019-11-24 MED ORDER — ASPIRIN EC 81 MG PO TBEC
81.0000 mg | DELAYED_RELEASE_TABLET | Freq: Every day | ORAL | Status: DC
  Administered 2019-11-24 – 2019-11-27 (×4): 81 mg via ORAL
  Filled 2019-11-24 (×4): qty 1

## 2019-11-24 MED ORDER — ACETAMINOPHEN 650 MG RE SUPP: 650.0000 mg | Freq: Four times a day (QID) | RECTAL | Status: DC | PRN

## 2019-11-24 MED ORDER — SODIUM CHLORIDE 0.9 % IV SOLN: INTRAVENOUS | Status: AC

## 2019-11-24 MED ORDER — HEPARIN SODIUM (PORCINE) 5000 UNIT/ML IJ SOLN
5000.0000 [IU] | Freq: Three times a day (TID) | INTRAMUSCULAR | Status: DC
  Administered 2019-11-24 – 2019-11-27 (×11): 5000 [IU] via SUBCUTANEOUS
  Filled 2019-11-24 (×11): qty 1

## 2019-11-24 MED ORDER — SODIUM CHLORIDE 0.9 % IV SOLN
3.0000 g | Freq: Two times a day (BID) | INTRAVENOUS | Status: DC
  Administered 2019-11-24 – 2019-11-26 (×6): 3 g via INTRAVENOUS
  Filled 2019-11-24: qty 8
  Filled 2019-11-24: qty 3
  Filled 2019-11-24 (×4): qty 8
  Filled 2019-11-24 (×2): qty 3
  Filled 2019-11-24: qty 8

## 2019-11-24 MED ORDER — INSULIN ASPART 100 UNIT/ML ~~LOC~~ SOLN: 0.0000 [IU] | Freq: Three times a day (TID) | SUBCUTANEOUS | Status: DC

## 2019-11-24 MED ORDER — TECHNETIUM TO 99M ALBUMIN AGGREGATED
4.3000 | Freq: Once | INTRAVENOUS | Status: AC | PRN
  Administered 2019-11-24: 4.3 via INTRAVENOUS

## 2019-11-24 MED ORDER — GABAPENTIN 300 MG PO CAPS
300.0000 mg | ORAL_CAPSULE | Freq: Three times a day (TID) | ORAL | Status: DC
  Administered 2019-11-24 – 2019-11-27 (×10): 300 mg via ORAL
  Filled 2019-11-24 (×11): qty 1

## 2019-11-24 MED ORDER — AMLODIPINE BESYLATE 10 MG PO TABS
10.0000 mg | ORAL_TABLET | Freq: Every day | ORAL | Status: DC
  Administered 2019-11-24 – 2019-11-27 (×4): 10 mg via ORAL
  Filled 2019-11-24: qty 2
  Filled 2019-11-24 (×3): qty 1

## 2019-11-24 MED ORDER — ACETAMINOPHEN 325 MG PO TABS
650.0000 mg | ORAL_TABLET | Freq: Four times a day (QID) | ORAL | Status: DC | PRN
  Administered 2019-11-27: 650 mg via ORAL
  Filled 2019-11-24: qty 2

## 2019-11-24 MED ORDER — POTASSIUM CHLORIDE CRYS ER 20 MEQ PO TBCR
40.0000 meq | EXTENDED_RELEASE_TABLET | Freq: Once | ORAL | Status: AC
  Administered 2019-11-24: 40 meq via ORAL
  Filled 2019-11-24: qty 2

## 2019-11-24 MED ORDER — PANTOPRAZOLE SODIUM 40 MG PO TBEC
40.0000 mg | DELAYED_RELEASE_TABLET | Freq: Every day | ORAL | Status: DC
  Administered 2019-11-24 – 2019-11-26 (×3): 40 mg via ORAL
  Filled 2019-11-24 (×3): qty 1

## 2019-11-24 MED ORDER — ISOSORBIDE MONONITRATE ER 60 MG PO TB24
60.0000 mg | ORAL_TABLET | Freq: Two times a day (BID) | ORAL | Status: DC
  Administered 2019-11-24 – 2019-11-27 (×8): 60 mg via ORAL
  Filled 2019-11-24: qty 1
  Filled 2019-11-24: qty 2
  Filled 2019-11-24 (×2): qty 1
  Filled 2019-11-24: qty 2
  Filled 2019-11-24: qty 1
  Filled 2019-11-24: qty 2
  Filled 2019-11-24 (×2): qty 1

## 2019-11-24 MED ORDER — MIDAZOLAM HCL 2 MG/2ML IJ SOLN: 2.0000 mg | Freq: Once | INTRAMUSCULAR | Status: DC

## 2019-11-24 MED ORDER — GUAIFENESIN ER 600 MG PO TB12
600.0000 mg | ORAL_TABLET | Freq: Two times a day (BID) | ORAL | Status: DC
  Administered 2019-11-24 – 2019-11-27 (×8): 600 mg via ORAL
  Filled 2019-11-24 (×8): qty 1

## 2019-11-24 MED ORDER — ONDANSETRON HCL 4 MG/2ML IJ SOLN: 4.0000 mg | Freq: Four times a day (QID) | INTRAMUSCULAR | Status: DC | PRN

## 2019-11-24 MED ORDER — ATORVASTATIN CALCIUM 40 MG PO TABS
40.0000 mg | ORAL_TABLET | Freq: Every day | ORAL | Status: DC
  Administered 2019-11-24 – 2019-11-26 (×3): 40 mg via ORAL
  Filled 2019-11-24 (×3): qty 1

## 2019-11-24 MED ORDER — CARVEDILOL 12.5 MG PO TABS
12.5000 mg | ORAL_TABLET | Freq: Two times a day (BID) | ORAL | Status: DC
  Administered 2019-11-24 – 2019-11-27 (×7): 12.5 mg via ORAL
  Filled 2019-11-24 (×3): qty 1
  Filled 2019-11-24: qty 4
  Filled 2019-11-24 (×3): qty 1

## 2019-11-24 NOTE — ED Notes (Signed)
Called pharmacy to check on status of previously requested Unasyn, was told that they would send it immediately

## 2019-11-24 NOTE — Progress Notes (Signed)
Pharmacy Antibiotic Note  [name] is a 86 y.o. female admitted on 11/23/2019 with aspiration pna.  Pharmacy has been consulted for Unasyn dosing.  Plan: Unasyn 3g IV every 12 hours Monitor renal function, clinical progression and LOT     Temp (24hrs), Avg:98.4 F (36.9 C), Min:98.4 F (36.9 C), Max:98.4 F (36.9 C)  Recent Labs  Lab 11/23/19 1513 11/24/19 0226  WBC 5.6 6.1  CREATININE 1.76* 1.80*    CrCl cannot be calculated (Unknown ideal weight.).    No Known Allergies  Bertis Ruddy, PharmD Clinical Pharmacist ED Pharmacist Phone # [phone] 11/24/2019 12:28 PM

## 2019-11-24 NOTE — Progress Notes (Signed)
[facility]  PROGRESS NOTE  [name] TDH:[id number] DOB: [birth year] DOA: 11/23/2019 PCP: Dierdre Harness, FNP   Brief HPI:   86 year old female with medical history of chronic diastolic CHF, EF 55 to 64% by TTE on 04/12/2019, CKD stage IIIb, severe one-vessel CAD with chronic angina, history of CVA, diabetes mellitus type 2 diet-controlled, hypertension, hyperlipidemia, chronic hoarseness, blindness of left eye came to ED for evaluation of cough and shortness of breath.  Patient said that she has ongoing chronic cough productive of clear sputum.  She has been seen by ENT recently in August underwent laryngoscopy which showed glottic edema and small vocal cord nodules.  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  In the ED patient was found to be hypoxemic requiring 6 L/min of oxygen BNP was 581.6.  CT chest without contrast showed atelectasis with superimposed lower lobe airway thickening.  Age-indeterminate T4-T5 and T7 compression fractures seen which were new as compared to prior imaging from 2011.  D-dimer is elevated at 1.98.  Subjective   Patient seen and examined, breathing stable with 6 L/min oxygen via nasal cannula.   Assessment/Plan:     1. Acute hypoxemic respiratory failure-unclear etiology, D-dimer is elevated 1.98.  CTA chest with contrast could not be obtained due to elevated creatinine.  Will obtain VQ scan and obtain bilateral venous duplex of lower extremities.  CT chest without contrast did not show significant abnormality.  ABG obtained shows pH 7.441, PCO2 39.8, PO2 73.  Will consult pulmonology for further recommendations. 2. Hoarseness of voice-patient recently was seen by ENT and underwent direct laryngoscopy which showed glottic edema and small nodules on the vocal cords.  Follow-up ENT as outpatient. 3. CKD stage IIIb-patient baseline creatinine 1.46 as of May 2021, she presents with creatinine 1.76.   Today creatinine is 1.80.  Patient takes Lasix at home.  Lasix is currently on hold.  Follow BMP in am. 4. Chronic diastolic CHF-patient's last EF in March 2021 as per echo was 55 to 60%.  Lasix is on hold due to volume depletion on admission.  Patient was placed on IV normal saline.  She appears euvolemic at this time.  Will monitor off Lasix.  Follow BMP in am. 5. CAD with chronic angina-chronic, stable.  Continue aspirin, Coreg, atorvastatin, Imdur. 6. Diabetes mellitus type 2-continue sliding scale insulin with NovoLog.  Check hemoglobin A1c. 7. Hypertension-blood pressure stable, continue amlodipine, Coreg, Imdur. 8. T5, T7, T7 compression fractures-seen on CT imaging, indeterminate age.  Patient is not having any back pain at this time.  We will continue to monitor. 9. History of CVA-continue aspirin, statin. 10. Hypokalemia-replace potassium and follow BMP in am.     COVID-19 Labs  Recent Labs    11/24/19 0226  DDIMER 1.98*    Lab Results  Component Value Date   SARSCOV2NAA NEGATIVE 11/23/2019   [place] NEGATIVE 06/06/2019   [place] NEGATIVE 04/07/2019   [place] NEGATIVE 11/01/2018     Scheduled medications:   . amLODipine  10 mg Oral Daily  . aspirin EC  81 mg Oral Daily  . atorvastatin  40 mg Oral q1800  . carvedilol  12.5 mg Oral BID WC  . gabapentin  300 mg Oral TID  . guaiFENesin  600 mg Oral BID  . heparin  5,000 Units Subcutaneous Q8H  . insulin aspart  0-9 Units Subcutaneous TID WC  . isosorbide mononitrate  60 mg Oral BID  . pantoprazole  40 mg Oral Daily         CBG: Recent Labs  Lab 11/24/19 0801 11/24/19 0802  GLUCAP 95 98    SpO2: 97 % O2 Flow Rate (L/min): 6 L/min    CBC: Recent Labs  Lab 11/23/19 1513 11/24/19 0226 11/24/19 0842  WBC 5.6 6.1  --   HGB 11.2* 10.7* 10.9*  HCT 36.2 34.2* 32.0*  MCV 90.7 88.8  --   PLT 271 242  --     Basic Metabolic Panel: Recent Labs  Lab 11/23/19 1513 11/24/19 0226  11/24/19 0842  NA 139 142 143  K 3.5 3.3* 3.4*  CL 104 108  --   CO2 23 26  --   GLUCOSE 210* 85  --   BUN 14 15  --   CREATININE 1.76* 1.80*  --   CALCIUM 9.0 8.9  --      Liver Function Tests: No results for input(s): AST, ALT, ALKPHOS, BILITOT, PROT, ALBUMIN in the last 168 hours.   Antibiotics: Anti-infectives (From admission, onward)   None       DVT prophylaxis: Heparin  Code Status: DNR  Family Communication: No family at bedside   Consultants:    Procedures:      Objective   Vitals:   11/24/19 0856 11/24/19 0900 11/24/19 0957 11/24/19 1000  BP: (!) 144/77 (!) 155/78 (!) 155/78 (!) 180/53  Pulse: 76 78  69  Resp:  19  15  Temp:      TempSrc:      SpO2:  90%  97%   No intake or output data in the 24 hours ending 11/24/19 1059  No intake/output data recorded.  There were no vitals filed for this visit.  Physical Examination:    General: Appears in no acute distress  Cardiovascular: S1-S2, regular, no murmur auscultated  Respiratory: Decreased breath sounds at lung bases  Abdomen: Abdomen is soft, nontender, no organomegaly  Extremities: Trace edema in the lower extremities bilaterally  Neurologic: Cranial nerves II through XII grossly intact, no focal deficit noted   Status is: Inpatient  Dispo: The patient is from: Home              Anticipated d/c is to: Home              Anticipated d/c date is: 11/28/2019              Patient currently not medically stable for discharge  Barrier to discharge-ongoing management for acute hypoxemic respiratory failure      Data Reviewed:   Recent Results (from the past 240 hour(s))  Respiratory Panel by RT PCR (Flu A&B, Covid) - Nasopharyngeal Swab     Status: None   Collection Time: 11/23/19  3:20 PM   Specimen: Nasopharyngeal Swab  Result Value Ref Range Status   SARS Coronavirus 2 by RT PCR NEGATIVE NEGATIVE Final    Comment: (NOTE) SARS-CoV-2 target nucleic acids are NOT  DETECTED.  The SARS-CoV-2 RNA is generally detectable in upper respiratoy specimens during the acute phase of infection. The lowest concentration of SARS-CoV-2 viral copies this assay can detect is 131 copies/mL. A negative result does not preclude SARS-Cov-2 infection and should not be used as the sole basis for treatment or other patient management decisions. A negative result may occur with  improper specimen collection/handling, submission of specimen other than nasopharyngeal swab, presence of viral mutation(s) within the areas targeted by this assay, and inadequate number of viral copies (<131 copies/mL).  A negative result must be combined with clinical observations, patient history, and epidemiological information. The expected result is Negative.  Fact Sheet for Patients:  PinkCheek.be  Fact Sheet for Healthcare Providers:  GravelBags.it  This test is no t yet approved or cleared by the [place] FDA and  has been authorized for detection and/or diagnosis of SARS-CoV-2 by FDA under an Emergency Use Authorization (EUA). This EUA will remain  in effect (meaning this test can be used) for the duration of the COVID-19 declaration under Section 564(b)(1) of the Act, 21 U.S.C. section 360bbb-3(b)(1), unless the authorization is terminated or revoked sooner.     Influenza A by PCR NEGATIVE NEGATIVE Final   Influenza B by PCR NEGATIVE NEGATIVE Final    Comment: (NOTE) The Xpert Xpress SARS-CoV-2/FLU/RSV assay is intended as an aid in  the diagnosis of influenza from Nasopharyngeal swab specimens and  should not be used as a sole basis for treatment. Nasal washings and  aspirates are unacceptable for Xpert Xpress SARS-CoV-2/FLU/RSV  testing.  Fact Sheet for Patients: PinkCheek.be  Fact Sheet for Healthcare Providers: GravelBags.it  This test is not yet  approved or cleared by the [place] FDA and  has been authorized for detection and/or diagnosis of SARS-CoV-2 by  FDA under an Emergency Use Authorization (EUA). This EUA will remain  in effect (meaning this test can be used) for the duration of the  Covid-19 declaration under Section 564(b)(1) of the Act, 21  U.S.C. section 360bbb-3(b)(1), unless the authorization is  terminated or revoked. Performed at [facility], [place] [address], [place],  [address]     No results for input(s): LIPASE, AMYLASE in the last 168 hours. No results for input(s): AMMONIA in the last 168 hours.  Cardiac Enzymes: No results for input(s): CKTOTAL, CKMB, CKMBINDEX, TROPONINI in the last 168 hours. BNP (last 3 results) Recent Labs    01/20/19 0052 04/07/19 1307 11/23/19 1836  BNP 84.3 1,028.0* 581.6*    ProBNP (last 3 results) No results for input(s): PROBNP in the last 8760 hours.  Studies:  CT Chest Wo Contrast  Result Date: 11/23/2019 CLINICAL DATA:  86 year old female with chest pain, shortness of breath, weakness. Cough. EXAM: CT CHEST WITHOUT CONTRAST TECHNIQUE: Multidetector CT imaging of the chest was performed following the standard protocol without IV contrast. COMPARISON:  Portable chest today.  Chest CT 06/05/2009 and earlier. FINDINGS: Cardiovascular: Calcified coronary artery and aortic atherosclerosis. Stable cardiac size, borderline to mild cardiomegaly. No pericardial effusion. Stable tortuosity of the aorta. Tortuous brachiocephalic artery at the right thoracic inlet and abutting the thyroid in the midline. Vascular patency is not evaluated in the absence of IV contrast. Mediastinum/Nodes: No mediastinal or hilar lymphadenopathy is evident in the absence of contrast. Lungs/Pleura: Stable lung volumes compared to the 2011 CTA. Atelectatic changes to the lower lobe airways. Superimposed bilateral lower lobe bronchial wall thickening. No consolidation or pleural effusion.  Mild dependent atelectasis and other crowding of lung base markings. Upper Abdomen: Abdominal calcified atherosclerosis. Negative visible noncontrast liver, spleen, pancreas, adrenal glands, kidneys, and bowel in the upper abdomen. Musculoskeletal: Mild chronic T3 compression fracture is stable since 2011. However, T4, T5 and T7 compression fractures are new since that time. These are age indeterminate but favored to be chronic. No retropulsion or complicating features. No other acute osseous abnormality identified. IMPRESSION: 1. Atelectasis with superimposed lower lobe airway thickening. Consider bronchitis. 2. Age indeterminate T4, T5 and T7 compression fractures are new since 2011. If there  is acute back pain and specific therapy such as vertebroplasty is desired, Lumbar MRI or Nuclear Medicine Whole-body Bone Scan would best determine acuity. 3. Calcified coronary artery and Aortic Atherosclerosis (ICD10-I70.0). Electronically Signed   By: Genevie Ann M.D.   On: 11/23/2019 20:09   NM Pulmonary Perfusion  Result Date: 11/24/2019 CLINICAL DATA:  Suspected pulmonary embolism.  CHF. EXAM: NUCLEAR MEDICINE PERFUSION LUNG SCAN TECHNIQUE: Perfusion images were obtained in multiple projections after intravenous injection of radiopharmaceutical. Ventilation scans intentionally deferred if perfusion scan and chest x-ray adequate for interpretation during COVID 19 epidemic. RADIOPHARMACEUTICALS:  4.3 mCi Tc-34m MAA IV COMPARISON:  Chest imaging yesterday. FINDINGS: Perfusion study is normal.  No finding to suggest pulmonary emboli. IMPRESSION: Normal perfusion exam. Electronically Signed   By: Nelson Chimes M.D.   On: 11/24/2019 10:50   DG Chest Port 1 View  Result Date: 11/23/2019 CLINICAL DATA:  Weakness and cough EXAM: PORTABLE CHEST 1 VIEW COMPARISON:  None. FINDINGS: The heart size and mediastinal contours are within normal limits. The aortic knob calcifications are seen. Both lungs are clear. The visualized  skeletal structures are unremarkable. IMPRESSION: No active disease. Electronically Signed   By: Prudencio Pair M.D.   On: 11/23/2019 17:50       [place]   [facility] If 7PM-7AM, please contact night-coverage at [email or web address], Office  [phone]   11/24/2019, 10:59 AM  LOS: 0 days

## 2019-11-24 NOTE — Consult Note (Signed)
NAME:  [name], MRN:  [id number], DOB:  [birth year], LOS: 0 ADMISSION DATE:  11/23/2019, CONSULTATION DATE:  11/11 REFERRING MD:  Darrick Meigs, CHIEF COMPLAINT:  Hypoxia    Brief History   86 year old female admitted by IM 11/10 w/ cc of dyspnea and new hypoxia. PCCM asked to assist w/ evaluation of hypoxia  after CT chest showing lower lobe atx w/ some bronchiectasis changes and neg VQ scan.   History of present illness    86 year old female w/ fairly complicated PMH as outlined below. Presented to the [facility] ER 11/10 w/ cc: shortness of breath. On arrival reported chronic cough for "years", this has gotten worse as of late w/ eating (feels like she chokes more on food) and at night  w/ clear sputum production, chronic vocal hoarseness, but developed new shortness of breath the day of arrival.  Denied fever, sick exposure, HA, nasal congestion, chest pain, wheezing. Reported the shortness of breath first noted:   In ER: room air sats 90% initially but then dropped to 80s requiring supplemental oxygen up to 6 liters.  COVID and influenza were neg; CT chest w/out contrast: 1. Atelectasis with superimposed lower lobe airway thickening. 2. Age indeterminate T4, T5 and T7 compression fractures.  Admitted to hospitalist service BNP 581.6 11/11 NM perfusion study w/ nml perfusion. Still on 6 liters of oxygen. 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  chronic hoarseness (seen by [facility]->last Laryngoscopy 8/21-> glottic edema.  She has small vocal cord nodules with normal vocal cord mobility otherwise., (L) eye blindness.  Open angle glaucoma  Significant Hospital Events   11/10 admitted  Consults:  pulm 11/11  Procedures:    Significant Diagnostic Tests:  CT chest 11/10: 1. Atelectasis with superimposed lower lobe airway thickening. 2. Age  indeterminate T4, T5 and T7 compression fractures.  VQ scan 11/11: neg   Micro Data:    Antimicrobials:    Interim history/subjective:  Resting in bed. No distress but is short of breath   Objective   Blood pressure (Abnormal) 180/53, pulse 69, temperature 98.4 F (36.9 C), temperature source Oral, resp. rate 15, SpO2 97 %.       No intake or output data in the 24 hours ending 11/24/19 1105 There were no vitals filed for this visit.  Examination: General: frail 86 year old female resting in bed. No acute distress  HENT: NCAT, voice is hoarse. Neck veins flat. MMM Lungs: posterior rales. No accessory use. 6 liters via [place] Cardiovascular: Regular irreg w/ systolic murmur that radiates to carotid Abdomen: warm and dry  Extremities: warm dry w/ brisk CR Neuro: awake and alert GU: voids  Resolved Hospital Problem list     Assessment & Plan:   Acute hypoxic respiratory failure  Acute on chronic cough Aspiration PNA  Dysphagia  Bronchiectasis  HFrEF CAD Heart murmur  Palpitations  CKD stage III H/o tongue cancer  Chronic vocal hoarseness  DM   Discussion  She has had chronic cough which is much worse as of late when eating. Given her h/o tongue cancer and xrt she is already predisposed fairly significantly to dysphagia and aspiration. Her CT shows some bronchiectatic changes and bronchial thickening. I would favor aspiration as the etiology of her acute hypoxia and chronic dysphagia and perhaps reflux as the etiology of  her chronic cough.   Plan Started Unasyn Aspiration precautions SLP eval. Would like have them do MBS Might benefit from esophagram at some point too depending on the [facility]      Best practice:  Per primary   Labs   CBC: Recent Labs  Lab 11/23/19 1513 11/24/19 0226 11/24/19 0842  WBC 5.6 6.1  --   HGB 11.2* 10.7* 10.9*  HCT 36.2 34.2* 32.0*  MCV 90.7 88.8  --   PLT 271 242  --     Basic Metabolic Panel: Recent Labs  Lab 11/23/19 1513  11/24/19 0226 11/24/19 0842  NA 139 142 143  K 3.5 3.3* 3.4*  CL 104 108  --   CO2 23 26  --   GLUCOSE 210* 85  --   BUN 14 15  --   CREATININE 1.76* 1.80*  --   CALCIUM 9.0 8.9  --    GFR: CrCl cannot be calculated (Unknown ideal weight.). Recent Labs  Lab 11/23/19 1513 11/24/19 0226  WBC 5.6 6.1    Liver Function Tests: No results for input(s): AST, ALT, ALKPHOS, BILITOT, PROT, ALBUMIN in the last 168 hours. No results for input(s): LIPASE, AMYLASE in the last 168 hours. No results for input(s): AMMONIA in the last 168 hours.  ABG    Component Value Date/Time   PHART 7.441 11/24/2019 0842   PCO2ART 39.8 11/24/2019 0842   PO2ART 73 (L) 11/24/2019 0842   HCO3 27.1 11/24/2019 0842   TCO2 28 11/24/2019 0842   ACIDBASEDEF 4.0 (H) 02/21/2018 0616   O2SAT 95.0 11/24/2019 0842     Coagulation Profile: No results for input(s): INR, PROTIME in the last 168 hours.  Cardiac Enzymes: No results for input(s): CKTOTAL, CKMB, CKMBINDEX, TROPONINI in the last 168 hours.  HbA1C: Hgb A1c MFr Bld  Date/Time Value Ref Range Status  11/24/2019 02:26 AM 5.7 (H) 4.8 - 5.6 % Final    Comment:    (NOTE) Pre diabetes:          5.7%-6.4%  Diabetes:              >6.4%  Glycemic control for   <7.0% adults with diabetes   04/08/2019 04:37 AM 6.2 (H) 4.8 - 5.6 % Final    Comment:    (NOTE) Pre diabetes:          5.7%-6.4% Diabetes:              >6.4% Glycemic control for   <7.0% adults with diabetes     CBG: Recent Labs  Lab 11/24/19 0801 11/24/19 0802  GLUCAP 95 98    Review of Systems:   Review of Systems  Constitutional: Positive for malaise/fatigue. Negative for fever.  HENT: Positive for sore throat. Negative for congestion and nosebleeds.   Eyes: Negative.   Respiratory: Positive for cough, sputum production and shortness of breath.   Cardiovascular: Positive for palpitations. Negative for chest pain and leg swelling.  Gastrointestinal: Positive for  heartburn.  Genitourinary: Negative.   Neurological: Negative.   Endo/Heme/Allergies: Negative.   Psychiatric/Behavioral: Negative.      Past Medical History  She,  has a past medical history of Anemia (04/03/2011), Anginal pain ([place]), Anxiety, Aortic valve sclerosis w/ systolic murmur, Arthritis, Blind left eye (1999), CKD (chronic kidney disease) stage 3, GFR 30-59 ml/min, Depression, Diastolic dysfunction, Exertional dyspnea, GERD (gastroesophageal reflux disease), High cholesterol, Hip fracture ([place]) (07/17/11), Hypertension, Ischemic stroke ([place]) (05/2017), Prosthetic eye globe (1999), and Type II diabetes mellitus ([place]).  Surgical History    Past Surgical History:  Procedure Laterality Date  . ENUCLEATION Left   . FRACTURE SURGERY    . HIP ARTHROPLASTY  07/18/2011   Procedure: ARTHROPLASTY BIPOLAR HIP;  Surgeon: Nita Sells, MD;  Location: [facility];  Service: Orthopedics;  Laterality: Left;  . INTRAOCULAR PROSTHESES INSERTION  1999   left  . LEFT HEART CATH AND CORS/GRAFTS ANGIOGRAPHY N/A 04/08/2019   Procedure: LEFT HEART CATH AND CORS/GRAFTS ANGIOGRAPHY;  Surgeon: Minna Merritts, MD;  Location: [facility];  Service: Cardiovascular;  Laterality: N/A;  . [place]   "cancer"  . TUBAL LIGATION  1970's     Social History   reports that she quit smoking about 22 years ago. Her smoking use included cigarettes. She has a 70.50 pack-year smoking history. She has never used smokeless tobacco. She reports previous alcohol use of about 17.0 standard drinks of alcohol per week. She reports that she does not use drugs.   Family History   Her family history includes Diabetes type II in her father, mother, and sister. There is no history of Colon cancer.   Allergies No Known Allergies   Home Medications  Prior to Admission medications   Medication Sig Start Date End Date Taking? Authorizing Provider  acetaminophen (TYLENOL) 325 MG tablet Take 2 tablets (650  mg total) by mouth every 6 (six) hours as needed for mild pain or fever (or temp > 37.5 C (99.5 F)). 06/05/17  Yes Elgergawy, Silver Huguenin, MD  ALPHAGAN P 0.1 % SOLN Place 1 drop into the right eye 2 (two) times daily. 10/10/15  Yes [provider]  amLODipine (NORVASC) 10 MG tablet Take 1 tablet (10 mg total) by mouth daily. 06/09/19 11/24/19 Yes Pahwani, Einar Grad, MD  atorvastatin (LIPITOR) 40 MG tablet Take 1 tablet (40 mg total) by mouth daily at 6 PM. 06/08/19  Yes Pahwani, Einar Grad, MD  carvedilol (COREG) 12.5 MG tablet Take 1 tablet (12.5 mg total) by mouth 2 (two) times daily with a meal. 06/08/19 11/24/19 Yes Pahwani, Einar Grad, MD  clopidogrel (PLAVIX) 75 MG tablet Take 75 mg by mouth daily.   Yes [provider]  folic acid (FOLVITE) 1 MG tablet Take 1 tablet (1 mg total) by mouth daily. 06/06/17  Yes Elgergawy, Silver Huguenin, MD  furosemide (LASIX) 40 MG tablet Take 1 tablet (40 mg total) by mouth daily. 06/08/19 11/24/19 Yes Pahwani, Einar Grad, MD  gabapentin (NEURONTIN) 300 MG capsule Take 300 mg by mouth 3 (three) times daily.    Yes [provider]  hydrALAZINE (APRESOLINE) 10 MG tablet TAKE 1 TABLET(10 MG) BY MOUTH THREE TIMES DAILY 08/01/19  Yes Belva Crome, MD  isosorbide mononitrate (IMDUR) 60 MG 24 hr tablet Take 1 tablet (60 mg total) by mouth 2 (two) times daily. 06/08/19 11/24/19 Yes Pahwani, Einar Grad, MD  Multiple Vitamin (MULTIVITAMIN WITH MINERALS) TABS tablet Take 1 tablet by mouth daily. 06/06/17  Yes Elgergawy, Silver Huguenin, MD  nitroGLYCERIN (NITROSTAT) 0.4 MG SL tablet Place 1 tablet (0.4 mg total) under the tongue every 5 (five) minutes as needed for chest pain. 02/11/19 11/24/19 Yes Isaiah Serge, NP  omeprazole (PRILOSEC) 20 MG capsule Take 1 capsule (20 mg total) by mouth daily. 02/11/19  Yes Isaiah Serge, NP  TRAVATAN Z 0.004 % SOLN ophthalmic solution Place 1 drop into the right eye at bedtime.  11/24/12  Yes [provider]  aspirin EC 81 MG tablet Take 81 mg by  mouth daily. Patient not taking: Reported on 11/24/2019    [provider]     Critical care time: NA  Erick Colace ACNP-BC [place] Pager # [phone] OR # [phone] if no answer\

## 2019-11-24 NOTE — Progress Notes (Signed)
Objective Swallowing Evaluation: Type of Study: MBS-Modified Barium Swallow Study   Patient Details  Name: [name] MRN: [id number] Date of Birth: 10-07-1932  Today's Date: 11/24/2019 Time: SLP Start Time (ACUTE ONLY): 1410 -SLP Stop Time (ACUTE ONLY): 1450  SLP Time Calculation (min) (ACUTE ONLY): 40 min   Past Medical History:  Past Medical History:  Diagnosis Date  . Anemia 04/03/2011  . Anginal pain ([place])    a. 02/2014 MV: Low risk; b. 05/2017 MV: small area of inferior basal ischemia. EF 54% - low risk-->Med mgmt.  Marland Kitchen Anxiety   . Aortic valve sclerosis w/ systolic murmur    a. 38/2505 AoV sclerosis w/o stenosis.  . Arthritis    "feet" (06/02/2017)  . Blind left eye 1999  . CKD (chronic kidney disease) stage 3, GFR 30-59 ml/min   . Depression   . Diastolic dysfunction    a. 10/2018 Echo: EF 60-65%, mild septal/posterior LVH. Impaired relaxation. Nl RV size/fxn. Mod dil LA. Trace TR. Triv AI. Mod AoV sclerosis w/o stenosis. Nl PASP.  Marland Kitchen Exertional dyspnea   . GERD (gastroesophageal reflux disease)   . High cholesterol   . Hip fracture ([place]) 07/17/11   fall from 1-2 feet; left  . Hypertension   . Ischemic stroke ([place]) 05/2017   a. 06/2017 Holter: RSR, PACs, no afib.  . Prosthetic eye globe 1999   "from diabetic; left eye"  . Type II diabetes mellitus ([place])    not taking any medication (06/02/2017)   Past Surgical History:  Past Surgical History:  Procedure Laterality Date  . ENUCLEATION Left   . FRACTURE SURGERY    . HIP ARTHROPLASTY  07/18/2011   Procedure: ARTHROPLASTY BIPOLAR HIP;  Surgeon: Nita Sells, MD;  Location: [facility];  Service: Orthopedics;  Laterality: Left;  . INTRAOCULAR PROSTHESES INSERTION  1999   left  . LEFT HEART CATH AND CORS/GRAFTS ANGIOGRAPHY N/A 04/08/2019   Procedure: LEFT HEART CATH AND CORS/GRAFTS ANGIOGRAPHY;  Surgeon: Minna Merritts, MD;  Location: [facility];  Service: Cardiovascular;  Laterality: N/A;  . [place]   "cancer"  . TUBAL LIGATION  1970's   HPI: 86 year old female with complicated PMH presented to the [facility] ER 11/10 with shortness of breath. On arrival reported chronic cough for "years", this has gotten worse as of late w/ eating (feels like she chokes more on food) and at night  w/ clear sputum production, chronic vocal hoarseness, but developed new shortness of breath the day of arrival. Hx includes prior CVA, base of tongue cancer with XRT 1999, chronic hoarseness. She was seen by Dr, Lucia Gaskins -last laryngoscopy 8/21 - glottic edema, small vocal cord nodules with normal vocal cord mobility otherwise. CT shows some bronchiectatic changes and bronchial thickening. Dx acute hypoxic respiratory failure, acute on chronic cough, aspiration pna.  MBS ordered per PCCM.    Subjective: alert, good historian    Assessment / Plan / Recommendation  CHL IP CLINICAL IMPRESSIONS 11/24/2019  Clinical Impression Pt presents with a chronic dysphagia with deterioration in the last few years, attributable to the long-term effects of radiation tx to the base of tongue in 1999.  Oral mechanism exam reveals tongue atrophy, inability to extend and poor lateralization, reduced retraction of tongue.  Effects of this are seen with decreased oral thrust of boluses to the back of the mouth and decreased cohesion.  Swallow trigger is timely.  There is reduced pharyngeal stripping.  Effects of tongue base/pharyngeal weakness  lead to diffuse residue.  Additionally, there is retention of barium in the proximal esophagus.  There was notable trace aspiration during and after the swallow with thin liquids, particularly when mixing solid/liquid consistencies.  Aspiration was inconsistently sensed - cough response not reliably elicited.  Head turn to the left resulted in marginal improvement with clearance of residue.   MBS was viewed in real time by the pt and then afterwards to show the areas of PO retention in throat and  upper esophagus.  We talked about the long-term unfortunate effects of radiation to the throat.  She is able to manage POs better when she alternates one bite of solid with a sip of liquid to help clear it through pharynx/esophagus.  For today, recommend changing diet to full liquids; follow strategies.  Ms. Umphlett verbalized agreement. SLP will follow acutely and to help pt with a longer-term plan to manage swallowing.      SLP Visit Diagnosis Dysphagia, oropharyngeal phase (R13.12)  Attention and concentration deficit following --  Frontal lobe and executive function deficit following --  Impact on safety and function Moderate aspiration risk      CHL IP TREATMENT RECOMMENDATION 11/24/2019  Treatment Recommendations Therapy as outlined in treatment plan below     Prognosis 11/24/2019  Prognosis for Safe Diet Advancement Guarded  Barriers to Reach Goals --  Barriers/Prognosis Comment --    CHL IP DIET RECOMMENDATION 11/24/2019  SLP Diet Recommendations Other (Comment)  Liquid Administration via Cup;Straw  Medication Administration Crushed with puree  Compensations Follow solids with liquid  Postural Changes Remain semi-upright after after feeds/meals (Comment)      CHL IP OTHER RECOMMENDATIONS 11/24/2019  Recommended Consults --  Oral Care Recommendations Oral care BID  Other Recommendations --      CHL IP FOLLOW UP RECOMMENDATIONS 11/24/2019  Follow up Recommendations Other (comment)      CHL IP FREQUENCY AND DURATION 11/24/2019  Speech Therapy Frequency (ACUTE ONLY) min 3x week  Treatment Duration 1 week           CHL IP ORAL PHASE 11/24/2019  Oral Phase Impaired  Oral - Pudding Teaspoon --  Oral - Pudding Cup --  Oral - Honey Teaspoon --  Oral - Honey Cup --  Oral - Nectar Teaspoon --  Oral - Nectar Cup --  Oral - Nectar Straw --  Oral - Thin Teaspoon --  Oral - Thin Cup Weak lingual manipulation;Lingual pumping;Incomplete tongue to palate contact;Decreased  bolus cohesion;Premature spillage  Oral - Thin Straw --  Oral - Puree Weak lingual manipulation;Lingual pumping;Incomplete tongue to palate contact;Decreased bolus cohesion;Premature spillage  Oral - Mech Soft Weak lingual manipulation;Lingual pumping;Incomplete tongue to palate contact;Decreased bolus cohesion;Premature spillage  Oral - Regular --  Oral - Multi-Consistency --  Oral - Pill --  Oral Phase - Comment --    CHL IP PHARYNGEAL PHASE 11/24/2019  Pharyngeal Phase Impaired  Pharyngeal- Pudding Teaspoon --  Pharyngeal --  Pharyngeal- Pudding Cup --  Pharyngeal --  Pharyngeal- Honey Teaspoon --  Pharyngeal --  Pharyngeal- Honey Cup --  Pharyngeal --  Pharyngeal- Nectar Teaspoon --  Pharyngeal --  Pharyngeal- Nectar Cup --  Pharyngeal --  Pharyngeal- Nectar Straw --  Pharyngeal --  Pharyngeal- Thin Teaspoon --  Pharyngeal --  Pharyngeal- Thin Cup Delayed swallow initiation-vallecula;Reduced pharyngeal peristalsis;Reduced tongue base retraction;Penetration/Apiration after swallow;Trace aspiration;Pharyngeal residue - pyriform;Pharyngeal residue - valleculae  Pharyngeal Material enters airway, passes BELOW cords without attempt by patient to eject out (silent aspiration);Material enters  airway, passes BELOW cords and not ejected out despite cough attempt by patient  Pharyngeal- Thin Straw --  Pharyngeal --  Pharyngeal- Puree Delayed swallow initiation-vallecula;Reduced pharyngeal peristalsis;Reduced tongue base retraction;Pharyngeal residue - pyriform;Pharyngeal residue - valleculae  Pharyngeal Material does not enter airway  Pharyngeal- Mechanical Soft Delayed swallow initiation-vallecula;Reduced pharyngeal peristalsis;Reduced tongue base retraction;Pharyngeal residue - pyriform;Pharyngeal residue - valleculae  Pharyngeal Material does not enter airway  Pharyngeal- Regular --  Pharyngeal --  Pharyngeal- Multi-consistency --  Pharyngeal --  Pharyngeal- Pill --  Pharyngeal  --  Pharyngeal Comment --     CHL IP CERVICAL ESOPHAGEAL PHASE 11/24/2019  Cervical Esophageal Phase (No Data)  Pudding Teaspoon --  Pudding Cup --  Honey Teaspoon --  Honey Cup --  Nectar Teaspoon --  Nectar Cup --  Nectar Straw --  Thin Teaspoon --  Thin Cup --  Thin Straw --  Puree --  Mechanical Soft --  Regular --  Multi-consistency --  Pill --  Cervical Esophageal Comment --     Juan Quam Laurice 11/24/2019, 3:40 PM

## 2019-11-24 NOTE — ED Notes (Signed)
Lunch Tray Ordered @ 1034. 

## 2019-11-24 NOTE — ED Notes (Signed)
Unasyn requested from pharmacy at this time

## 2019-11-24 NOTE — Progress Notes (Signed)
  Echocardiogram 2D Echocardiogram has been performed.  [name] 11/24/2019, 5:53 PM

## 2019-11-24 NOTE — Progress Notes (Signed)
Venous duplex lower ext  has been completed. Refer to [facility] under chart review to view preliminary results.   11/24/2019  3:57 PM [name], Bonnye Fava

## 2019-11-25 ENCOUNTER — Inpatient Hospital Stay (HOSPITAL_COMMUNITY): Payer: Medicare HMO

## 2019-11-25 DIAGNOSIS — R0902 Hypoxemia: Secondary | ICD-10-CM | POA: Diagnosis not present

## 2019-11-25 DIAGNOSIS — J9601 Acute respiratory failure with hypoxia: Secondary | ICD-10-CM

## 2019-11-25 DIAGNOSIS — I2510 Atherosclerotic heart disease of native coronary artery without angina pectoris: Secondary | ICD-10-CM | POA: Diagnosis not present

## 2019-11-25 DIAGNOSIS — I5032 Chronic diastolic (congestive) heart failure: Secondary | ICD-10-CM | POA: Diagnosis not present

## 2019-11-25 LAB — GLUCOSE, CAPILLARY
Glucose-Capillary: 78 mg/dL (ref 70–99)
Glucose-Capillary: 87 mg/dL (ref 70–99)
Glucose-Capillary: 108 mg/dL — ABNORMAL HIGH (ref 70–99)
Glucose-Capillary: 107 mg/dL — ABNORMAL HIGH (ref 70–99)

## 2019-11-25 LAB — BASIC METABOLIC PANEL
Anion gap: 13 (ref 5–15)
BUN: 17 mg/dL (ref 8–23)
CO2: 22 mmol/L (ref 22–32)
Calcium: 8.9 mg/dL (ref 8.9–10.3)
Chloride: 106 mmol/L (ref 98–111)
Creatinine, Ser: 1.88 mg/dL — ABNORMAL HIGH (ref 0.44–1.00)
GFR, Estimated: 26 mL/min — ABNORMAL LOW (ref >60)
Glucose, Bld: 80 mg/dL (ref 70–99)
Potassium: 4 mmol/L (ref 3.5–5.1)
Sodium: 141 mmol/L (ref 135–145)

## 2019-11-25 MED ORDER — SODIUM CHLORIDE 0.9 % IV SOLN
INTRAVENOUS | Status: DC | PRN
  Administered 2019-11-25: 250 mL via INTRAVENOUS

## 2019-11-25 MED ORDER — SODIUM CHLORIDE 0.9 % IV SOLN: INTRAVENOUS | Status: DC

## 2019-11-25 NOTE — Progress Notes (Signed)
  Speech Language Pathology Treatment: Dysphagia  Patient Details Name: [name] MRN: [id number] DOB: September 04, 1932 Today's Date: 11/25/2019 Time: 8832-5498 SLP Time Calculation (min) (ACUTE ONLY): 18 min  Assessment / Plan / Recommendation Clinical Impression  Pt seen after yesterday's MBS. We reviewed results.  She prefers to advance to regular solids from full liquids; she is cognizant of abilities and can select the solid foods that are easier for her to eat.  She recalls instructions to alternate bites of solid foods with sips of liquid to help clear residue.  She verbalizes understanding that her swallowing difficulties are related to radiation treatment years ago, and that we can try to modify habits/PO consistencies but there is little we can do to change the physiology of her swallowing or prevent aspiration.  Posted precautions after discussion, including that pt is likely to cough with meals and staff shouldn't panic and make her NPO.  SLP will f/u early next week. D/W RN.    HPI HPI: 86 year old female with complicated PMH presented to the [facility] ER 11/10 with shortness of breath. On arrival reported chronic cough for "years", this has gotten worse as of late w/ eating (feels like she chokes more on food) and at night  w/ clear sputum production, chronic vocal hoarseness, but developed new shortness of breath the day of arrival. Hx includes prior CVA, base of tongue cancer with XRT 1999, chronic hoarseness. She was seen by Dr, Lucia Gaskins -last laryngoscopy 8/21 - glottic edema, small vocal cord nodules with normal vocal cord mobility otherwise. CT shows some bronchiectatic changes and bronchial thickening. Dx acute hypoxic respiratory failure, acute on chronic cough, aspiration pna.  MBS ordered per PCCM.       SLP Plan  Continue with current plan of care       Recommendations  Diet recommendations: Regular;Thin liquid Liquids provided via: Cup;Straw Medication Administration:  Crushed with puree Supervision: Patient able to self feed Compensations: Follow solids with liquid                Oral Care Recommendations: Oral care BID Follow up Recommendations:  (tba) SLP Visit Diagnosis: Dysphagia, oropharyngeal phase (R13.12) Plan: Continue with current plan of care       GO                Juan Quam Laurice 11/25/2019, 12:52 PM  Delmon Andrada L. Tivis Ringer, [place] Office number [phone] Pager [phone]

## 2019-11-25 NOTE — Progress Notes (Signed)
[facility]  PROGRESS NOTE  [name] TIR:[id number] DOB: 09-10-1932 DOA: 11/23/2019 PCP: Dierdre Harness, FNP   Brief HPI:   86 year old female with medical history of chronic diastolic CHF, EF 55 to 67% by TTE on 04/12/2019, CKD stage IIIb, severe one-vessel CAD with chronic angina, history of CVA, diabetes mellitus type 2 diet-controlled, hypertension, hyperlipidemia, chronic hoarseness, blindness of left eye came to ED for evaluation of cough and shortness of breath.  Patient said that she has ongoing chronic cough productive of clear sputum.  She has been seen by ENT recently in August underwent laryngoscopy which showed glottic edema and small vocal cord nodules.  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  In the ED patient was found to be hypoxemic requiring 6 L/min of oxygen BNP was 581.6.  CT chest without contrast showed atelectasis with superimposed lower lobe airway thickening.  Age-indeterminate T4-T5 and T7 compression fractures seen which were new as compared to prior imaging from 2011.  D-dimer is elevated at 1.98.  Subjective   Patient seen and examined, still requiring 2 L/min of oxygen.  Denies cough or shortness of breath.  Speech therapy was consulted for swallow evaluation.   Assessment/Plan:     1. Acute hypoxemic respiratory failure-unclear etiology, D-dimer is elevated 1.98.  CTA chest with contrast could not be obtained due to elevated creatinine.  VQ scan was unremarkable, venous duplex of lower extremities negative for DVT.   CTA chest without contrast did not show significant abnormality.  ABG obtained shows pH 7.441, PCO2 39.8, PO2 73.  Will consult pulmonology for further recommendations. 2. ?  Aspiration pneumonia-pulmonology was consulted and there was concern for possible aspiration, speech therapy was consulted.  Patient recently had direct laryngoscopy by ENT which showed glottic edema and small  nodules on the vocal cords.  Continue IV Unasyn. 3. Dysphagia-MBS was performed by speech therapy, recommended esophagram.  Esophagram has been ordered.  We will follow the results. 4. Hoarseness of voice-patient recently was seen by ENT and underwent direct laryngoscopy which showed glottic edema and small nodules on the vocal cords.  Follow-up ENT as outpatient. 5. CKD stage IIIb-patient baseline creatinine 1.46 as of May 2021, she presents with creatinine 1.76.  Today creatinine is 1.88.  Patient takes Lasix at home.  Lasix is currently on hold.  Continue normal saline at 75 mill per hour. 6. Chronic diastolic CHF-patient's last EF in March 2021 as per echo was 55 to 60%.  Lasix is on hold due to volume depletion on admission.  Patient was placed on IV normal saline.  She appears euvolemic at this time.  Will monitor off Lasix.  Follow BMP in am. 7. CAD with chronic angina-chronic, stable.  Continue aspirin, Coreg, atorvastatin, Imdur. 8. Diabetes mellitus type 2-continue sliding scale insulin with NovoLog.  Hemoglobin A1c is 5.7.  Patient did have blood glucose reading of more than 200 yesterday. 9. Hypertension-blood pressure stable, continue amlodipine, Coreg, Imdur. 10. T5, T7, T7 compression fractures-seen on CT imaging, indeterminate age.  Patient is not having any back pain at this time.  We will continue to monitor. 11. History of CVA-continue aspirin, statin. 12. Hypokalemia-replete     COVID-19 Labs  Recent Labs    11/24/19 0226  DDIMER 1.98*    Lab Results  Component Value Date   SARSCOV2NAA NEGATIVE 11/23/2019   SARSCOV2NAA NEGATIVE 06/06/2019   [place] NEGATIVE 04/07/2019   [place] NEGATIVE 11/01/2018  Scheduled medications:   . amLODipine  10 mg Oral Daily  . aspirin EC  81 mg Oral Daily  . atorvastatin  40 mg Oral q1800  . carvedilol  12.5 mg Oral BID WC  . gabapentin  300 mg Oral TID  . guaiFENesin  600 mg Oral BID  . heparin  5,000 Units  Subcutaneous Q8H  . insulin aspart  0-9 Units Subcutaneous TID WC  . isosorbide mononitrate  60 mg Oral BID  . pantoprazole  40 mg Oral Daily         CBG: Recent Labs  Lab 11/24/19 1206 11/24/19 1700 11/24/19 2047 11/25/19 0606 11/25/19 1205  GLUCAP 88 109* 92 78 87    SpO2: 100 % O2 Flow Rate (L/min): 2 L/min    CBC: Recent Labs  Lab 11/23/19 1513 11/24/19 0226 11/24/19 0842  WBC 5.6 6.1  --   HGB 11.2* 10.7* 10.9*  HCT 36.2 34.2* 32.0*  MCV 90.7 88.8  --   PLT 271 242  --     Basic Metabolic Panel: Recent Labs  Lab 11/23/19 1513 11/24/19 0226 11/24/19 0842 11/25/19 0419  NA 139 142 143 141  K 3.5 3.3* 3.4* 4.0  CL 104 108  --  106  CO2 23 26  --  22  GLUCOSE 210* 85  --  80  BUN 14 15  --  17  CREATININE 1.76* 1.80*  --  1.88*  CALCIUM 9.0 8.9  --  8.9     Liver Function Tests: No results for input(s): AST, ALT, ALKPHOS, BILITOT, PROT, ALBUMIN in the last 168 hours.   Antibiotics: Anti-infectives (From admission, onward)   Start     Dose/Rate Route Frequency Ordered Stop   11/24/19 1230  Ampicillin-Sulbactam (UNASYN) 3 g in sodium chloride 0.9 % 100 mL IVPB        3 g 200 mL/hr over 30 Minutes Intravenous Every 12 hours 11/24/19 1228         DVT prophylaxis: Heparin  Code Status: DNR  Family Communication: No family at bedside   Consultants:    Procedures:      Objective   Vitals:   11/25/19 0034 11/25/19 0355 11/25/19 0810 11/25/19 1132  BP: (!) 151/61 (!) 155/75 (!) 126/59 (!) 142/62  Pulse: (!) 57 62 67 60  Resp: 17 14 18 18   Temp: 98.1 F (36.7 C) 98.1 F (36.7 C) 97.6 F (36.4 C) 97.7 F (36.5 C)  TempSrc: Oral Oral Oral Oral  SpO2: 97% 100% 98% 100%  Weight:  60.7 kg    Height:        Intake/Output Summary (Last 24 hours) at 11/25/2019 1546 Last data filed at 11/25/2019 0830 Gross per 24 hour  Intake 352.99 ml  Output 300 ml  Net 52.99 ml    11/10 1901 - 11/12 0700 In: 113 [I.V.:13] Out: 300  [Urine:300]  Filed Weights   11/24/19 1651 11/25/19 0355  Weight: 60.7 kg 60.7 kg    Physical Examination:   General-appears in no acute distress  Heart-S1-S2, regular, no murmur auscultated  Lungs-clear to auscultation bilaterally, no wheezing or crackles auscultated  Abdomen-soft, nontender, no organomegaly  Extremities-no edema in the lower extremities  Neuro-alert, oriented x3, no focal deficit noted   Status is: Inpatient  Dispo: The patient is from: Home              Anticipated d/c is to: Home  Anticipated d/c date is: 11/28/2019              Patient currently not medically stable for discharge  Barrier to discharge-ongoing management for acute hypoxemic respiratory failure      Data Reviewed:   Recent Results (from the past 240 hour(s))  Respiratory Panel by RT PCR (Flu A&B, Covid) - Nasopharyngeal Swab     Status: None   Collection Time: 11/23/19  3:20 PM   Specimen: Nasopharyngeal Swab  Result Value Ref Range Status   SARS Coronavirus 2 by RT PCR NEGATIVE NEGATIVE Final    Comment: (NOTE) SARS-CoV-2 target nucleic acids are NOT DETECTED.  The SARS-CoV-2 RNA is generally detectable in upper respiratoy specimens during the acute phase of infection. The lowest concentration of SARS-CoV-2 viral copies this assay can detect is 131 copies/mL. A negative result does not preclude SARS-Cov-2 infection and should not be used as the sole basis for treatment or other patient management decisions. A negative result may occur with  improper specimen collection/handling, submission of specimen other than nasopharyngeal swab, presence of viral mutation(s) within the areas targeted by this assay, and inadequate number of viral copies (<131 copies/mL). A negative result must be combined with clinical observations, patient history, and epidemiological information. The expected result is Negative.  Fact Sheet for Patients:   PinkCheek.be  Fact Sheet for Healthcare Providers:  GravelBags.it  This test is no t yet approved or cleared by the [place] FDA and  has been authorized for detection and/or diagnosis of SARS-CoV-2 by FDA under an Emergency Use Authorization (EUA). This EUA will remain  in effect (meaning this test can be used) for the duration of the COVID-19 declaration under Section 564(b)(1) of the Act, 21 U.S.C. section 360bbb-3(b)(1), unless the authorization is terminated or revoked sooner.     Influenza A by PCR NEGATIVE NEGATIVE Final   Influenza B by PCR NEGATIVE NEGATIVE Final    Comment: (NOTE) The Xpert Xpress SARS-CoV-2/FLU/RSV assay is intended as an aid in  the diagnosis of influenza from Nasopharyngeal swab specimens and  should not be used as a sole basis for treatment. Nasal washings and  aspirates are unacceptable for Xpert Xpress SARS-CoV-2/FLU/RSV  testing.  Fact Sheet for Patients: PinkCheek.be  Fact Sheet for Healthcare Providers: GravelBags.it  This test is not yet approved or cleared by the [place] FDA and  has been authorized for detection and/or diagnosis of SARS-CoV-2 by  FDA under an Emergency Use Authorization (EUA). This EUA will remain  in effect (meaning this test can be used) for the duration of the  Covid-19 declaration under Section 564(b)(1) of the Act, 21  U.S.C. section 360bbb-3(b)(1), unless the authorization is  terminated or revoked. Performed at [facility], [address], [place], [place] [address]     No results for input(s): LIPASE, AMYLASE in the last 168 hours. No results for input(s): AMMONIA in the last 168 hours.  Cardiac Enzymes: No results for input(s): CKTOTAL, CKMB, CKMBINDEX, TROPONINI in the last 168 hours. BNP (last 3 results) Recent Labs    01/20/19 0052 04/07/19 1307 11/23/19 1836  BNP  84.3 1,028.0* 581.6*    ProBNP (last 3 results) No results for input(s): PROBNP in the last 8760 hours.  Studies:  CT Chest Wo Contrast  Result Date: 11/23/2019 CLINICAL DATA:  86 year old female with chest pain, shortness of breath, weakness. Cough. EXAM: CT CHEST WITHOUT CONTRAST TECHNIQUE: Multidetector CT imaging of the chest was performed following the standard protocol  without IV contrast. COMPARISON:  Portable chest today.  Chest CT 06/05/2009 and earlier. FINDINGS: Cardiovascular: Calcified coronary artery and aortic atherosclerosis. Stable cardiac size, borderline to mild cardiomegaly. No pericardial effusion. Stable tortuosity of the aorta. Tortuous brachiocephalic artery at the right thoracic inlet and abutting the thyroid in the midline. Vascular patency is not evaluated in the absence of IV contrast. Mediastinum/Nodes: No mediastinal or hilar lymphadenopathy is evident in the absence of contrast. Lungs/Pleura: Stable lung volumes compared to the 2011 CTA. Atelectatic changes to the lower lobe airways. Superimposed bilateral lower lobe bronchial wall thickening. No consolidation or pleural effusion. Mild dependent atelectasis and other crowding of lung base markings. Upper Abdomen: Abdominal calcified atherosclerosis. Negative visible noncontrast liver, spleen, pancreas, adrenal glands, kidneys, and bowel in the upper abdomen. Musculoskeletal: Mild chronic T3 compression fracture is stable since 2011. However, T4, T5 and T7 compression fractures are new since that time. These are age indeterminate but favored to be chronic. No retropulsion or complicating features. No other acute osseous abnormality identified. IMPRESSION: 1. Atelectasis with superimposed lower lobe airway thickening. Consider bronchitis. 2. Age indeterminate T4, T5 and T7 compression fractures are new since 2011. If there is acute back pain and specific therapy such as vertebroplasty is desired, Lumbar MRI or Nuclear Medicine  Whole-body Bone Scan would best determine acuity. 3. Calcified coronary artery and Aortic Atherosclerosis (ICD10-I70.0). Electronically Signed   By: Genevie Ann M.D.   On: 11/23/2019 20:09   NM Pulmonary Perfusion  Result Date: 11/24/2019 CLINICAL DATA:  Suspected pulmonary embolism.  CHF. EXAM: NUCLEAR MEDICINE PERFUSION LUNG SCAN TECHNIQUE: Perfusion images were obtained in multiple projections after intravenous injection of radiopharmaceutical. Ventilation scans intentionally deferred if perfusion scan and chest x-ray adequate for interpretation during COVID 19 epidemic. RADIOPHARMACEUTICALS:  4.3 mCi Tc-36m MAA IV COMPARISON:  Chest imaging yesterday. FINDINGS: Perfusion study is normal.  No finding to suggest pulmonary emboli. IMPRESSION: Normal perfusion exam. Electronically Signed   By: Nelson Chimes M.D.   On: 11/24/2019 10:50   DG Chest Port 1 View  Result Date: 11/23/2019 CLINICAL DATA:  Weakness and cough EXAM: PORTABLE CHEST 1 VIEW COMPARISON:  None. FINDINGS: The heart size and mediastinal contours are within normal limits. The aortic knob calcifications are seen. Both lungs are clear. The visualized skeletal structures are unremarkable. IMPRESSION: No active disease. Electronically Signed   By: Prudencio Pair M.D.   On: 11/23/2019 17:50   DG Swallowing Func-Speech Pathology  Result Date: 11/24/2019 Objective Swallowing Evaluation: Type of Study: MBS-Modified Barium Swallow Study  Patient Details Name: [name] MRN: [id number] Date of Birth: September 17, 1932 Today's Date: 11/24/2019 Time: SLP Start Time (ACUTE ONLY): 1410 -SLP Stop Time (ACUTE ONLY): 1450 SLP Time Calculation (min) (ACUTE ONLY): 40 min Past Medical History: Past Medical History: Diagnosis Date . Anemia 04/03/2011 . Anginal pain ([place])   a. 02/2014 MV: Low risk; b. 05/2017 MV: small area of inferior basal ischemia. EF 54% - low risk-->Med mgmt. Marland Kitchen Anxiety  . Aortic valve sclerosis w/ systolic murmur   a. 62/5638 AoV sclerosis w/o  stenosis. . Arthritis   "feet" (06/02/2017) . Blind left eye 1999 . CKD (chronic kidney disease) stage 3, GFR 30-59 ml/min  . Depression  . Diastolic dysfunction   a. 10/2018 Echo: EF 60-65%, mild septal/posterior LVH. Impaired relaxation. Nl RV size/fxn. Mod dil LA. Trace TR. Triv AI. Mod AoV sclerosis w/o stenosis. Nl PASP. Marland Kitchen Exertional dyspnea  . GERD (gastroesophageal reflux disease)  . High cholesterol  . Hip fracture (  [place]) 07/17/11  fall from 1-2 feet; left . Hypertension  . Ischemic stroke ([place]) 05/2017  a. 06/2017 Holter: RSR, PACs, no afib. . 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 Type II diabetes mellitus ([place])   not taking any medication (06/02/2017) Past Surgical History: Past Surgical History: Procedure Laterality Date . ENUCLEATION Left  . FRACTURE SURGERY   . HIP ARTHROPLASTY  07/18/2011  Procedure: ARTHROPLASTY BIPOLAR HIP;  Surgeon: Nita Sells, MD;  Location: [facility];  Service: Orthopedics;  Laterality: Left; . INTRAOCULAR PROSTHESES INSERTION  1999  left . LEFT HEART CATH AND CORS/GRAFTS ANGIOGRAPHY N/A 04/08/2019  Procedure: LEFT HEART CATH AND CORS/GRAFTS ANGIOGRAPHY;  Surgeon: Minna Merritts, MD;  Location: [facility];  Service: Cardiovascular;  Laterality: N/A; . [place]  "cancer" . TUBAL LIGATION  2359's HPI: 86 year old female with complicated PMH presented to the [facility] ER 11/10 with shortness of breath. On arrival reported chronic cough for "years", this has gotten worse as of late w/ eating (feels like she chokes more on food) and at night  w/ clear sputum production, chronic vocal hoarseness, but developed new shortness of breath the day of arrival. Hx includes prior CVA, base of tongue cancer with XRT 1999, chronic hoarseness. She was seen by Dr, Lucia Gaskins -last laryngoscopy 8/21 - glottic edema, small vocal cord nodules with normal vocal cord mobility otherwise. CT shows some bronchiectatic changes and bronchial thickening. Dx acute hypoxic  respiratory failure, acute on chronic cough, aspiration pna.  MBS ordered per PCCM.  Subjective: alert, good historian Assessment / Plan / Recommendation CHL IP CLINICAL IMPRESSIONS 11/24/2019 Clinical Impression Pt presents with a chronic dysphagia with deterioration in the last few years, attributable to the long-term effects of radiation tx to the base of tongue in 1999.  Oral mechanism exam reveals tongue atrophy, inability to extend and poor lateralization, reduced retraction of tongue.  Effects of this are seen with decreased oral thrust of boluses to the back of the mouth and decreased cohesion.  Swallow trigger is timely.  There is reduced pharyngeal stripping.  Effects of tongue base/pharyngeal weakness lead to diffuse residue.  Additionally, there is retention of barium in the proximal esophagus.  There was notable trace aspiration during and after the swallow with thin liquids, particularly when mixing solid/liquid consistencies.  Aspiration was inconsistently sensed - cough response not reliably elicited.  Head turn to the left resulted in marginal improvement of clearance of residue.   MBS was viewed in real time by the pt and then afterwards to show the areas of PO retention in throat and upper esophagus.  We talked about the long-term unfortunate effects of radiation to the throat.  She is able to manage POs better when she alternates one bite of solid with a sip of liquid to help clear it through pharynx/esophagus.  For today, recommend changing diet to full liquids; follow strategies.  Ms. Rigdon verbalized agreement. SLP will follow acutely and to help pt with a longer-term plan to manage swallowing.     SLP Visit Diagnosis Dysphagia, oropharyngeal phase (R13.12) Attention and concentration deficit following -- Frontal lobe and executive function deficit following -- Impact on safety and function Moderate aspiration risk   CHL IP TREATMENT RECOMMENDATION 11/24/2019 Treatment Recommendations  Therapy as outlined in treatment plan below   Prognosis 11/24/2019 Prognosis for Safe Diet Advancement Guarded Barriers to Reach Goals -- Barriers/Prognosis Comment -- CHL IP DIET RECOMMENDATION 11/24/2019 SLP Diet Recommendations Other (  Comment) Liquid Administration via Cup;Straw Medication Administration Crushed with puree Compensations Follow solids with liquid Postural Changes Remain semi-upright after after feeds/meals (Comment)   CHL IP OTHER RECOMMENDATIONS 11/24/2019 Recommended Consults -- Oral Care Recommendations Oral care BID Other Recommendations --   CHL IP FOLLOW UP RECOMMENDATIONS 11/24/2019 Follow up Recommendations Other (comment)   CHL IP FREQUENCY AND DURATION 11/24/2019 Speech Therapy Frequency (ACUTE ONLY) min 3x week Treatment Duration 1 week      CHL IP ORAL PHASE 11/24/2019 Oral Phase Impaired Oral - Pudding Teaspoon -- Oral - Pudding Cup -- Oral - Honey Teaspoon -- Oral - Honey Cup -- Oral - Nectar Teaspoon -- Oral - Nectar Cup -- Oral - Nectar Straw -- Oral - Thin Teaspoon -- Oral - Thin Cup Weak lingual manipulation;Lingual pumping;Incomplete tongue to palate contact;Decreased bolus cohesion;Premature spillage Oral - Thin Straw -- Oral - Puree Weak lingual manipulation;Lingual pumping;Incomplete tongue to palate contact;Decreased bolus cohesion;Premature spillage Oral - Mech Soft Weak lingual manipulation;Lingual pumping;Incomplete tongue to palate contact;Decreased bolus cohesion;Premature spillage Oral - Regular -- Oral - Multi-Consistency -- Oral - Pill -- Oral Phase - Comment --  CHL IP PHARYNGEAL PHASE 11/24/2019 Pharyngeal Phase Impaired Pharyngeal- Pudding Teaspoon -- Pharyngeal -- Pharyngeal- Pudding Cup -- Pharyngeal -- Pharyngeal- Honey Teaspoon -- Pharyngeal -- Pharyngeal- Honey Cup -- Pharyngeal -- Pharyngeal- Nectar Teaspoon -- Pharyngeal -- Pharyngeal- Nectar Cup -- Pharyngeal -- Pharyngeal- Nectar Straw -- Pharyngeal -- Pharyngeal- Thin Teaspoon -- Pharyngeal --  Pharyngeal- Thin Cup Delayed swallow initiation-vallecula;Reduced pharyngeal peristalsis;Reduced tongue base retraction;Penetration/Apiration after swallow;Trace aspiration;Pharyngeal residue - pyriform;Pharyngeal residue - valleculae Pharyngeal Material enters airway, passes BELOW cords without attempt by patient to eject out (silent aspiration);Material enters airway, passes BELOW cords and not ejected out despite cough attempt by patient Pharyngeal- Thin Straw -- Pharyngeal -- Pharyngeal- Puree Delayed swallow initiation-vallecula;Reduced pharyngeal peristalsis;Reduced tongue base retraction;Pharyngeal residue - pyriform;Pharyngeal residue - valleculae Pharyngeal Material does not enter airway Pharyngeal- Mechanical Soft Delayed swallow initiation-vallecula;Reduced pharyngeal peristalsis;Reduced tongue base retraction;Pharyngeal residue - pyriform;Pharyngeal residue - valleculae Pharyngeal Material does not enter airway Pharyngeal- Regular -- Pharyngeal -- Pharyngeal- Multi-consistency -- Pharyngeal -- Pharyngeal- Pill -- Pharyngeal -- Pharyngeal Comment --  CHL IP CERVICAL ESOPHAGEAL PHASE 11/24/2019 Cervical Esophageal Phase (No Data) Pudding Teaspoon -- Pudding Cup -- Honey Teaspoon -- Honey Cup -- Nectar Teaspoon -- Nectar Cup -- Nectar Straw -- Thin Teaspoon -- Thin Cup -- Thin Straw -- Puree -- Mechanical Soft -- Regular -- Multi-consistency -- Pill -- Cervical Esophageal Comment -- Juan Quam Laurice 11/24/2019, 3:41 PM              VAS [place] LOWER EXTREMITY VENOUS (DVT)  Result Date: 11/24/2019  Lower Venous DVT Study Indications: Elevated D dimer.  Comparison Study: No prior. Performing Technologist: Oda Cogan RDMS, RVT  Examination Guidelines: A complete evaluation includes B-mode imaging, spectral Doppler, color Doppler, and power Doppler as needed of all accessible portions of each vessel. Bilateral testing is considered an integral part of a complete examination. Limited examinations for  reoccurring indications may be performed as noted. The reflux portion of the exam is performed with the patient in reverse Trendelenburg.  +---------+---------------+---------+-----------+----------+--------------+ RIGHT    CompressibilityPhasicitySpontaneityPropertiesThrombus Aging +---------+---------------+---------+-----------+----------+--------------+ CFV      Full           Yes      Yes                                 +---------+---------------+---------+-----------+----------+--------------+  SFJ      Full                                                        +---------+---------------+---------+-----------+----------+--------------+ FV Prox  Full                                                        +---------+---------------+---------+-----------+----------+--------------+ FV Mid   Full                                                        +---------+---------------+---------+-----------+----------+--------------+ FV DistalFull                                                        +---------+---------------+---------+-----------+----------+--------------+ PFV      Full                                                        +---------+---------------+---------+-----------+----------+--------------+ POP      Full           Yes      Yes                                 +---------+---------------+---------+-----------+----------+--------------+ PTV      Full                                                        +---------+---------------+---------+-----------+----------+--------------+ PERO     Full                                                        +---------+---------------+---------+-----------+----------+--------------+   +---------+---------------+---------+-----------+----------+--------------+ LEFT     CompressibilityPhasicitySpontaneityPropertiesThrombus Aging  +---------+---------------+---------+-----------+----------+--------------+ CFV      Full           Yes      Yes                                 +---------+---------------+---------+-----------+----------+--------------+ SFJ      Full                                                        +---------+---------------+---------+-----------+----------+--------------+  FV Prox  Full                                                        +---------+---------------+---------+-----------+----------+--------------+ FV Mid   Full                                                        +---------+---------------+---------+-----------+----------+--------------+ FV DistalFull                                                        +---------+---------------+---------+-----------+----------+--------------+ PFV      Full                                                        +---------+---------------+---------+-----------+----------+--------------+ POP      Full           Yes      Yes                                 +---------+---------------+---------+-----------+----------+--------------+ PTV      Full                                                        +---------+---------------+---------+-----------+----------+--------------+ PERO     Full                                                        +---------+---------------+---------+-----------+----------+--------------+     Summary: BILATERAL: - No evidence of deep vein thrombosis seen in the lower extremities, bilaterally. -No evidence of popliteal cyst, bilaterally.   *See table(s) above for measurements and observations. Electronically signed by Monica Martinez MD on 11/24/2019 at 4:23:43 PM.    Final        Oswald Hillock   [facility] If 7PM-7AM, please contact night-coverage at [email or web address], Office  [phone]   11/25/2019, 3:46 PM  LOS: 1 day

## 2019-11-25 NOTE — Progress Notes (Signed)
[facility]  Discussed patient with speech therapy Recent laryngoscopy performed by ENT showed edema, normal function of cords They recommend esophogram to evaluate for stricture/narrowing etc of esophagus. Will order  Roselie Awkward, MD [place] [facility] Pager: [phone] Cell: [phone] If no response, call [phone]

## 2019-11-25 NOTE — Progress Notes (Signed)
NAME:  [name], MRN:  [id number], DOB:  [birth year], LOS: 1 ADMISSION DATE:  11/23/2019, CONSULTATION DATE:  11/11 REFERRING MD:  Darrick Meigs, CHIEF COMPLAINT:  Hypoxia    Brief History   86 year old female admitted by IM 11/10 w/ cc of dyspnea and new hypoxia. PCCM asked to assist w/ evaluation of hypoxia  after CT chest showing lower lobe atx w/ some bronchiectasis changes and neg VQ scan.   History of present illness    86 year old female w/ fairly complicated PMH as outlined below. Presented to the [facility] ER 11/10 w/ cc: shortness of breath. On arrival reported chronic cough for "years", this has gotten worse as of late w/ eating (feels like she chokes more on food) and at night  w/ clear sputum production, chronic vocal hoarseness, but developed new shortness of breath the day of arrival.  Denied fever, sick exposure, HA, nasal congestion, chest pain, wheezing. Reported the shortness of breath first noted:   In ER: room air sats 90% initially but then dropped to 80s requiring supplemental oxygen up to 6 liters.  COVID and influenza were neg; CT chest w/out contrast: 1. Atelectasis with superimposed lower lobe airway thickening. 2. Age indeterminate T4, T5 and T7 compression fractures.  Admitted to hospitalist service BNP 581.6 11/11 NM perfusion study w/ nml perfusion. Still on 6 liters of oxygen. 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  chronic hoarseness (seen by [facility]->last Laryngoscopy 8/21-> glottic edema.  She has small vocal cord nodules with normal vocal cord mobility otherwise., (L) eye blindness.  Open angle glaucoma  Significant Hospital Events   11/10 admitted  Consults:  pulm 11/11  Procedures:    Significant Diagnostic Tests:  CT chest 11/10: 1. Atelectasis with superimposed lower lobe airway thickening. 2. Age  indeterminate T4, T5 and T7 compression fractures.  VQ scan 11/11: neg   11/24/2019 status post swallow evaluation-does demonstrate penetration she is on a dysphagia diet  Micro Data:    Antimicrobials:    Interim history/subjective:  No acute distress at rest  Objective   Blood pressure (!) 126/59, pulse 67, temperature 97.6 F (36.4 C), temperature source Oral, resp. rate 18, height 5' (1.524 m), weight 60.7 kg, SpO2 98 %.        Intake/Output Summary (Last 24 hours) at 11/25/2019 1045 Last data filed at 11/25/2019 0830 Gross per 24 hour  Intake 352.99 ml  Output 300 ml  Net 52.99 ml   Filed Weights   11/24/19 1651 11/25/19 0355  Weight: 60.7 kg 60.7 kg    Examination: General: 86 year old female no acute distress HEENT: Speech is hoarse but this is chronic speech is clear no JVD or lymphadenopathy is appreciated Neuro: Grossly intact without focal defect CV: Heart sounds are regular PULM: Diminished throughout GI: soft, bsx4 active  GU: Voids Extremities: warm/dry,  edema, reports numbness at tip tips Skin: no rashes or lesions   Resolved Hospital Problem list     Assessment & Plan:   Acute hypoxic respiratory failure  Acute on chronic cough Aspiration PNA  Dysphagia  Bronchiectasis  HFrEF CAD Heart murmur  Palpitations  CKD stage III H/o tongue cancer  Chronic vocal hoarseness  DM   Discussion  She has had chronic cough which is much worse as of late when eating. Given her h/o  tongue cancer and xrt she is already predisposed fairly significantly to dysphagia and aspiration. Her CT shows some bronchiectatic changes and bronchial thickening. I would favor aspiration as the etiology of her acute hypoxia and chronic dysphagia and perhaps reflux as the etiology of her chronic cough.   Plan Currently on empirical antimicrobial therapy  Speech therapy has completed modified barium which demonstrates intermittent aspiration she is currently on dysphagia  diet  On low-dose oxygen wean as tolerated  Pulmonary critical care will be available as needed  Best practice:  Per primary   Labs   CBC: Recent Labs  Lab 11/23/19 1513 11/24/19 0226 11/24/19 0842  WBC 5.6 6.1  --   HGB 11.2* 10.7* 10.9*  HCT 36.2 34.2* 32.0*  MCV 90.7 88.8  --   PLT 271 242  --     Basic Metabolic Panel: Recent Labs  Lab 11/23/19 1513 11/24/19 0226 11/24/19 0842 11/25/19 0419  NA 139 142 143 141  K 3.5 3.3* 3.4* 4.0  CL 104 108  --  106  CO2 23 26  --  22  GLUCOSE 210* 85  --  80  BUN 14 15  --  17  CREATININE 1.76* 1.80*  --  1.88*  CALCIUM 9.0 8.9  --  8.9   GFR: Estimated Creatinine Clearance: 17.8 mL/min (A) (by C-G formula based on SCr of 1.88 mg/dL (H)). Recent Labs  Lab 11/23/19 1513 11/24/19 0226  WBC 5.6 6.1    Liver Function Tests: No results for input(s): AST, ALT, ALKPHOS, BILITOT, PROT, ALBUMIN in the last 168 hours. No results for input(s): LIPASE, AMYLASE in the last 168 hours. No results for input(s): AMMONIA in the last 168 hours.  ABG    Component Value Date/Time   PHART 7.441 11/24/2019 0842   PCO2ART 39.8 11/24/2019 0842   PO2ART 73 (L) 11/24/2019 0842   HCO3 27.1 11/24/2019 0842   TCO2 28 11/24/2019 0842   ACIDBASEDEF 4.0 (H) 02/21/2018 0616   O2SAT 95.0 11/24/2019 0842     Coagulation Profile: No results for input(s): INR, PROTIME in the last 168 hours.  Cardiac Enzymes: No results for input(s): CKTOTAL, CKMB, CKMBINDEX, TROPONINI in the last 168 hours.  HbA1C: Hgb A1c MFr Bld  Date/Time Value Ref Range Status  11/24/2019 02:26 AM 5.7 (H) 4.8 - 5.6 % Final    Comment:    (NOTE) Pre diabetes:          5.7%-6.4%  Diabetes:              >6.4%  Glycemic control for   <7.0% adults with diabetes   04/08/2019 04:37 AM 6.2 (H) 4.8 - 5.6 % Final    Comment:    (NOTE) Pre diabetes:          5.7%-6.4% Diabetes:              >6.4% Glycemic control for   <7.0% adults with diabetes      CBG: Recent Labs  Lab 11/24/19 0802 11/24/19 1206 11/24/19 1700 11/24/19 2047 11/25/19 0606  GLUCAP 98 88 109* 92 78     Critical care time: NA  Steve Angeliyah Kirkey ACNP Acute Care Nurse Practitioner [place] Please consult Amion 11/25/2019, 10:46 AM

## 2019-11-26 DIAGNOSIS — R0902 Hypoxemia: Secondary | ICD-10-CM | POA: Diagnosis not present

## 2019-11-26 DIAGNOSIS — J9601 Acute respiratory failure with hypoxia: Secondary | ICD-10-CM | POA: Diagnosis not present

## 2019-11-26 DIAGNOSIS — I2510 Atherosclerotic heart disease of native coronary artery without angina pectoris: Secondary | ICD-10-CM | POA: Diagnosis not present

## 2019-11-26 DIAGNOSIS — I5032 Chronic diastolic (congestive) heart failure: Secondary | ICD-10-CM | POA: Diagnosis not present

## 2019-11-26 LAB — BASIC METABOLIC PANEL
Anion gap: 9 (ref 5–15)
BUN: 14 mg/dL (ref 8–23)
CO2: 24 mmol/L (ref 22–32)
Calcium: 8.5 mg/dL — ABNORMAL LOW (ref 8.9–10.3)
Chloride: 107 mmol/L (ref 98–111)
Creatinine, Ser: 1.62 mg/dL — ABNORMAL HIGH (ref 0.44–1.00)
GFR, Estimated: 31 mL/min — ABNORMAL LOW (ref >60)
Glucose, Bld: 78 mg/dL (ref 70–99)
Potassium: 3.8 mmol/L (ref 3.5–5.1)
Sodium: 140 mmol/L (ref 135–145)

## 2019-11-26 LAB — CBC
HCT: 31.8 % — ABNORMAL LOW (ref 36.0–46.0)
Hemoglobin: 10.1 g/dL — ABNORMAL LOW (ref 12.0–15.0)
MCH: 28.2 pg (ref 26.0–34.0)
MCHC: 31.8 g/dL (ref 30.0–36.0)
MCV: 88.8 fL (ref 80.0–100.0)
Platelets: 243 10*3/uL (ref 150–400)
RBC: 3.58 MIL/uL — ABNORMAL LOW (ref 3.87–5.11)
RDW: 17.4 % — ABNORMAL HIGH (ref 11.5–15.5)
WBC: 5 10*3/uL (ref 4.0–10.5)
nRBC: 0 % (ref 0.0–0.2)

## 2019-11-26 LAB — GLUCOSE, CAPILLARY
Glucose-Capillary: 80 mg/dL (ref 70–99)
Glucose-Capillary: 109 mg/dL — ABNORMAL HIGH (ref 70–99)
Glucose-Capillary: 113 mg/dL — ABNORMAL HIGH (ref 70–99)
Glucose-Capillary: 115 mg/dL — ABNORMAL HIGH (ref 70–99)

## 2019-11-26 MED ORDER — INFLUENZA VAC A&B SA ADJ QUAD 0.5 ML IM PRSY
0.5000 mL | PREFILLED_SYRINGE | INTRAMUSCULAR | Status: DC
  Filled 2019-11-26: qty 0.5

## 2019-11-26 NOTE — Progress Notes (Signed)
[facility]  PROGRESS NOTE  [name] BJS:[id number] DOB: 09/07/1932 DOA: 11/23/2019 PCP: Dierdre Harness, FNP   Brief HPI:   86 year old female with medical history of chronic diastolic CHF, EF 55 to 60% by TTE on 04/12/2019, CKD stage IIIb, severe one-vessel CAD with chronic angina, history of CVA, diabetes mellitus type 2 diet-controlled, hypertension, hyperlipidemia, chronic hoarseness, blindness of left eye came to ED for evaluation of cough and shortness of breath.  Patient said that she has ongoing chronic cough productive of clear sputum.  She has been seen by ENT recently in August underwent laryngoscopy which showed glottic edema and small vocal cord nodules.  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  In the ED patient was found to be hypoxemic requiring 6 L/min of oxygen BNP was 581.6.  CT chest without contrast showed atelectasis with superimposed lower lobe airway thickening.  Age-indeterminate T4-T5 and T7 compression fractures seen which were new as compared to prior imaging from 2011.  D-dimer is elevated at 1.98.  Subjective   Patient seen and examined, breathing has improved.  She is no longer requiring oxygen.  Esophagram shows no stricture or mass.  Shows poor esophageal motility.   Assessment/Plan:     1. Acute hypoxemic respiratory failure-resolved, likely from aspiration pneumonia D-dimer was elevated at 1.98.  CTA chest with contrast could not be obtained due to elevated creatinine.  VQ scan was unremarkable, venous duplex of lower extremities negative for DVT.   CTA chest without contrast did not show significant abnormality.  ABG obtained shows pH 7.441, PCO2 39.8, PO2 73.  Pulmonology was consulted. 2. Aspiration pneumonia-pulmonology was consulted and there was concern for possible aspiration, speech therapy was consulted.  Patient recently had direct laryngoscopy by ENT which showed glottic edema and  small nodules on the vocal cords.  Patient started on IV Unasyn.  She has significantly improved, no longer requiring oxygen. 3. Dysphagia-MBS was performed by speech therapy, recommended esophagram.  Esophagram shows no strictures or mass.  It shows poor esophageal motility.  Patient is on regular diet with thin liquids. 4. Hoarseness of voice-patient recently was seen by ENT and underwent direct laryngoscopy which showed glottic edema and small nodules on the vocal cords.  Follow-up ENT as outpatient. 5. CKD stage IIIb-patient baseline creatinine 1.46 as of May 2021, she presents with creatinine 1.76.  Today creatinine is 1.88.  Patient takes Lasix at home.  Lasix is currently on hold.  Continue normal saline at 75 mill per hour. 6. Chronic diastolic CHF-patient's last EF in March 2021 as per echo was 55 to 60%.  Lasix is on hold due to volume depletion on admission.  Patient was placed on IV normal saline.  She appears euvolemic at this time.  Will monitor off Lasix.  Follow BMP in am. 7. CAD with chronic angina-chronic, stable.  Continue aspirin, Coreg, atorvastatin, Imdur. 8. Diabetes mellitus type 2-continue sliding scale insulin with NovoLog.  Hemoglobin A1c is 5.7.  Patient did have blood glucose reading of more than 200 yesterday. 9. Hypertension-blood pressure stable, continue amlodipine, Coreg, Imdur. 10. T5, T7, T7 compression fractures-seen on CT imaging, indeterminate age.  Patient is not having any back pain at this time.  We will continue to monitor. 11. History of CVA-continue aspirin, statin. 12. Hypokalemia-replete     COVID-19 Labs  Recent Labs    11/24/19 0226  DDIMER 1.98*    Lab Results  Component Value Date  [place] NEGATIVE 11/23/2019   [place] NEGATIVE 06/06/2019   [place] NEGATIVE 04/07/2019   [place] NEGATIVE 11/01/2018     Scheduled medications:   . amLODipine  10 mg Oral Daily  . aspirin EC  81 mg Oral Daily  . atorvastatin  40 mg Oral  q1800  . carvedilol  12.5 mg Oral BID WC  . gabapentin  300 mg Oral TID  . guaiFENesin  600 mg Oral BID  . heparin  5,000 Units Subcutaneous Q8H  . [START ON 11/27/2019] influenza vaccine adjuvanted  0.5 mL Intramuscular Tomorrow-1000  . insulin aspart  0-9 Units Subcutaneous TID WC  . isosorbide mononitrate  60 mg Oral BID  . pantoprazole  40 mg Oral Daily         CBG: Recent Labs  Lab 11/25/19 1205 11/25/19 1659 11/25/19 2143 11/26/19 0601 11/26/19 1128  GLUCAP 87 108* 107* 80 109*    SpO2: 97 % O2 Flow Rate (L/min): 2 L/min    CBC: Recent Labs  Lab 11/23/19 1513 11/24/19 0226 11/24/19 0842 11/26/19 0438  WBC 5.6 6.1  --  5.0  HGB 11.2* 10.7* 10.9* 10.1*  HCT 36.2 34.2* 32.0* 31.8*  MCV 90.7 88.8  --  88.8  PLT 271 242  --  322    Basic Metabolic Panel: Recent Labs  Lab 11/23/19 1513 11/24/19 0226 11/24/19 0842 11/25/19 0419 11/26/19 0438  NA 139 142 143 141 140  K 3.5 3.3* 3.4* 4.0 3.8  CL 104 108  --  106 107  CO2 23 26  --  22 24  GLUCOSE 210* 85  --  80 78  BUN 14 15  --  17 14  CREATININE 1.76* 1.80*  --  1.88* 1.62*  CALCIUM 9.0 8.9  --  8.9 8.5*     Liver Function Tests: No results for input(s): AST, ALT, ALKPHOS, BILITOT, PROT, ALBUMIN in the last 168 hours.   Antibiotics: Anti-infectives (From admission, onward)   Start     Dose/Rate Route Frequency Ordered Stop   11/24/19 1230  Ampicillin-Sulbactam (UNASYN) 3 g in sodium chloride 0.9 % 100 mL IVPB        3 g 200 mL/hr over 30 Minutes Intravenous Every 12 hours 11/24/19 1228         DVT prophylaxis: Heparin  Code Status: DNR  Family Communication: No family at bedside   Consultants:    Procedures:      Objective   Vitals:   11/25/19 2125 11/26/19 0558 11/26/19 0804 11/26/19 1239  BP: (!) 166/56 (!) 169/58 (!) 170/62 (!) 167/65  Pulse: (!) 58 64 75 70  Resp: 14 14 17    Temp: 98.3 F (36.8 C) 98.8 F (37.1 C) 98 F (36.7 C)   TempSrc: Oral Oral Oral    SpO2: 100% 98% 95% 97%  Weight:  62.1 kg    Height:        Intake/Output Summary (Last 24 hours) at 11/26/2019 1604 Last data filed at 11/26/2019 0800 Gross per 24 hour  Intake 2162.25 ml  Output 400 ml  Net 1762.25 ml    11/11 1901 - 11/13 0700 In: 2475.2 [P.O.:880; I.V.:1045.2] Out: 700 [Urine:700]  Filed Weights   11/24/19 1651 11/25/19 0355 11/26/19 0558  Weight: 60.7 kg 60.7 kg 62.1 kg    Physical Examination:   General-appears in no acute distress  Heart-S1-S2, regular, no murmur auscultated  Lungs-clear to auscultation bilaterally, no wheezing or crackles auscultated  Abdomen-soft, nontender, no organomegaly  Extremities-no edema in  the lower extremities  Neuro-alert, oriented x3, no focal deficit noted   Status is: Inpatient  Dispo: The patient is from: Home              Anticipated d/c is to: Home              Anticipated d/c date is: 11/28/2019              Patient currently not medically stable for discharge  Barrier to discharge-ongoing management for acute hypoxemic respiratory failure      Data Reviewed:   Recent Results (from the past 240 hour(s))  Respiratory Panel by RT PCR (Flu A&B, Covid) - Nasopharyngeal Swab     Status: None   Collection Time: 11/23/19  3:20 PM   Specimen: Nasopharyngeal Swab  Result Value Ref Range Status   SARS Coronavirus 2 by RT PCR NEGATIVE NEGATIVE Final    Comment: (NOTE) SARS-CoV-2 target nucleic acids are NOT DETECTED.  The SARS-CoV-2 RNA is generally detectable in upper respiratoy specimens during the acute phase of infection. The lowest concentration of SARS-CoV-2 viral copies this assay can detect is 131 copies/mL. A negative result does not preclude SARS-Cov-2 infection and should not be used as the sole basis for treatment or other patient management decisions. A negative result may occur with  improper specimen collection/handling, submission of specimen other than nasopharyngeal swab, presence  of viral mutation(s) within the areas targeted by this assay, and inadequate number of viral copies (<131 copies/mL). A negative result must be combined with clinical observations, patient history, and epidemiological information. The expected result is Negative.  Fact Sheet for Patients:  PinkCheek.be  Fact Sheet for Healthcare Providers:  GravelBags.it  This test is no t yet approved or cleared by the [place] FDA and  has been authorized for detection and/or diagnosis of SARS-CoV-2 by FDA under an Emergency Use Authorization (EUA). This EUA will remain  in effect (meaning this test can be used) for the duration of the COVID-19 declaration under Section 564(b)(1) of the Act, 21 U.S.C. section 360bbb-3(b)(1), unless the authorization is terminated or revoked sooner.     Influenza A by PCR NEGATIVE NEGATIVE Final   Influenza B by PCR NEGATIVE NEGATIVE Final    Comment: (NOTE) The Xpert Xpress SARS-CoV-2/FLU/RSV assay is intended as an aid in  the diagnosis of influenza from Nasopharyngeal swab specimens and  should not be used as a sole basis for treatment. Nasal washings and  aspirates are unacceptable for Xpert Xpress SARS-CoV-2/FLU/RSV  testing.  Fact Sheet for Patients: PinkCheek.be  Fact Sheet for Healthcare Providers: GravelBags.it  This test is not yet approved or cleared by the [place] FDA and  has been authorized for detection and/or diagnosis of SARS-CoV-2 by  FDA under an Emergency Use Authorization (EUA). This EUA will remain  in effect (meaning this test can be used) for the duration of the  Covid-19 declaration under Section 564(b)(1) of the Act, 21  U.S.C. section 360bbb-3(b)(1), unless the authorization is  terminated or revoked. Performed at [facility], [place] [address] [place], [place] [address]     No results for  input(s): LIPASE, AMYLASE in the last 168 hours. No results for input(s): AMMONIA in the last 168 hours.  Cardiac Enzymes: No results for input(s): CKTOTAL, CKMB, CKMBINDEX, TROPONINI in the last 168 hours. BNP (last 3 results) Recent Labs    01/20/19 0052 04/07/19 1307 11/23/19 1836  BNP 84.3 1,028.0* 581.6*    ProBNP (last  3 results) No results for input(s): PROBNP in the last 8760 hours.  Studies:  DG ESOPHAGUS W SINGLE CM (SOL OR THIN BA)  Result Date: 11/25/2019 CLINICAL DATA:  Stroke. Throat cancer with radiation. Chronic cough. Dysphagia. Chronic coarseness. EXAM: ESOPHOGRAM/BARIUM SWALLOW TECHNIQUE: Single contrast examination was performed using thin barium and barium tablet. FLUOROSCOPY TIME:  Fluoroscopy Time:  2 minutes 42 second Radiation Exposure Index (if provided by the fluoroscopic device): Number of Acquired Spot Images: 0 COMPARISON:  None. FINDINGS: Decreased esophageal motility. No aspiration. No esophageal stricture or mass lesion. Barium tablet passed into the stomach without obstruction. IMPRESSION: Poor esophageal motility.  No stricture or mass. Barium tablet passed into the stomach. Electronically Signed   By: Franchot Gallo M.D.   On: 11/25/2019 15:55       [place]   [facility] If 7PM-7AM, please contact night-coverage at [email or web address], Office  [phone]   11/26/2019, 4:04 PM  LOS: 2 days

## 2019-11-27 DIAGNOSIS — R1310 Dysphagia, unspecified: Secondary | ICD-10-CM

## 2019-11-27 DIAGNOSIS — I5032 Chronic diastolic (congestive) heart failure: Secondary | ICD-10-CM | POA: Diagnosis not present

## 2019-11-27 DIAGNOSIS — R0902 Hypoxemia: Secondary | ICD-10-CM | POA: Diagnosis not present

## 2019-11-27 DIAGNOSIS — I2510 Atherosclerotic heart disease of native coronary artery without angina pectoris: Secondary | ICD-10-CM | POA: Diagnosis not present

## 2019-11-27 DIAGNOSIS — J9601 Acute respiratory failure with hypoxia: Secondary | ICD-10-CM | POA: Diagnosis not present

## 2019-11-27 LAB — GLUCOSE, CAPILLARY
Glucose-Capillary: 81 mg/dL (ref 70–99)
Glucose-Capillary: 83 mg/dL (ref 70–99)

## 2019-11-27 LAB — BASIC METABOLIC PANEL
Anion gap: 8 (ref 5–15)
BUN: 11 mg/dL (ref 8–23)
CO2: 21 mmol/L — ABNORMAL LOW (ref 22–32)
Calcium: 8.5 mg/dL — ABNORMAL LOW (ref 8.9–10.3)
Chloride: 111 mmol/L (ref 98–111)
Creatinine, Ser: 1.35 mg/dL — ABNORMAL HIGH (ref 0.44–1.00)
GFR, Estimated: 39 mL/min — ABNORMAL LOW (ref >60)
Glucose, Bld: 99 mg/dL (ref 70–99)
Potassium: 3.9 mmol/L (ref 3.5–5.1)
Sodium: 140 mmol/L (ref 135–145)

## 2019-11-27 MED ORDER — GUAIFENESIN ER 600 MG PO TB12
600.0000 mg | ORAL_TABLET | Freq: Two times a day (BID) | ORAL | 0 refills | Status: DC
Start: 2019-11-27 — End: 2020-05-24

## 2019-11-27 MED ORDER — ISOSORBIDE MONONITRATE 20 MG PO TABS
20.0000 mg | ORAL_TABLET | Freq: Two times a day (BID) | ORAL | Status: DC
  Filled 2019-11-27 (×2): qty 1

## 2019-11-27 MED ORDER — AMOXICILLIN-POT CLAVULANATE 875-125 MG PO TABS: 1.0000 | ORAL_TABLET | Freq: Two times a day (BID) | ORAL | 0 refills | Status: AC

## 2019-11-27 NOTE — Plan of Care (Signed)
Adequate for discharge.

## 2019-11-27 NOTE — Discharge Summary (Signed)
Physician Discharge Summary  [name] QAS:[id number] DOB: 05-12-1932 DOA: 11/23/2019  PCP: Dierdre Harness, FNP  Admit date: 11/23/2019 Discharge date: 11/27/2019  Time spent: 50* minutes  Recommendations for Outpatient Follow-up:  1. Follow-up ENT in 1 week  Discharge Diagnoses:  Principal Problem:   Hypoxia Active Problems:   Chronic diastolic heart failure (HCC)   Hypertension associated with stage 3 chronic kidney disease due to type 2 diabetes mellitus (HCC)   Stage 3 chronic kidney disease due to type 2 diabetes mellitus (HCC)   Coronary artery disease involving native coronary artery of native heart without angina pectoris   Acute hypoxemic respiratory failure [facility])   Discharge Condition: Stable  Diet recommendation: Heart healthy diet  Filed Weights   11/25/19 0355 11/26/19 0558 11/27/19 0549  Weight: 60.7 kg 62.1 kg 63.3 kg    History of present illness:  86 year old female with medical history of chronic diastolic CHF, EF 55 to 79% by TTE on 04/12/2019, CKD stage IIIb, severe one-vessel CAD with chronic angina, history of CVA, diabetes mellitus type 2 diet-controlled, hypertension, hyperlipidemia, chronic hoarseness, blindness of left eye came to ED for evaluation of cough and shortness of breath.  Patient said that she has ongoing chronic cough productive of clear sputum.  She has been seen by ENT recently in August underwent laryngoscopy which showed glottic edema and small vocal cord nodules.  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  In the ED patient was found to be hypoxemic requiring 6 L/min of oxygen BNP was 581.6.  CT chest without contrast showed atelectasis with superimposed lower lobe airway thickening.  Age-indeterminate T4-T5 and T7 compression fractures seen which were new as compared to prior imaging from 2011.  D-dimer is elevated at 1.98.  Hospital Course:   1. Acute hypoxemic respiratory  failure-resolved, likely from aspiration pneumonia D-dimer was elevated at 1.98.  CTA chest with contrast could not be obtained due to elevated creatinine.  VQ scan was unremarkable, venous duplex of lower extremities negative for DVT.   CTA chest without contrast did not show significant abnormality.  ABG obtained shows pH 7.441, PCO2 39.8, PO2 73.  Pulmonology was consulted, and patient started on IV Unasyn for possible aspiration pneumonia. 2. Aspiration pneumonia-pulmonology was consulted and there was concern for possible aspiration, speech therapy was consulted.  Patient recently had direct laryngoscopy by ENT which showed glottic edema and small nodules on the vocal cords.  Patient started on IV Unasyn.  She has significantly improved, no longer requiring oxygen.  Will discharge patient on Augmentin 1 tablet p.o. twice daily for 3 more days. 3. Dysphagia-MBS was performed by speech therapy, recommended esophagram.  Esophagram shows no strictures or mass.  It shows poor esophageal dysmotility.  Patient is on regular diet with thin liquids. 4. Hoarseness of voice-patient recently was seen by ENT and underwent direct laryngoscopy which showed glottic edema and small nodules on the vocal cords.  Follow-up ENT as outpatient in 1 week. 5. CKD stage IIIb-patient baseline creatinine 1.46 as of May 2021, she presents with creatinine 1.76.  Today creatinine is 1.88.  Patient takes Lasix at home.  Lasix was on hold.    Today creatinine is 1.35.  Will restart Lasix for chronic diastolic CHF. 6. Chronic diastolic CHF-patient's last EF in March 2021 as per echo was 55 to 60%.  Lasix is on hold due to volume depletion on admission.  Patient was placed on IV normal  saline.  Patient takes Lasix at home.  Continue Lasix 40 mg daily. 7. CAD with chronic angina-chronic, stable.  Continue aspirin, Coreg, atorvastatin, Imdur. 8. Diabetes mellitus type 2-continue sliding scale insulin with NovoLog.  Hemoglobin A1c is 5.7.    9. Hypertension-blood pressure stable, continue amlodipine, Coreg, Imdur. 10. T5, T7, T7 compression fractures-seen on CT imaging, indeterminate age.  Patient is not having any back pain at this time.   11. History of CVA-continue aspirin, statin. 12. Hypokalemia-replete  Procedures:  None  Consultations:  Pulmonology  Discharge Exam: Vitals:   11/27/19 0430 11/27/19 1132  BP: (!) 169/60 (!) 178/65  Pulse: 62 63  Resp: 16 18  Temp: 98.2 F (36.8 C) 98.7 F (37.1 C)  SpO2: 95% 100%    General: Appears in no acute distress Cardiovascular: S1-S2, regular Respiratory: Clear to auscultation bilaterally  Discharge Instructions   Discharge Instructions    Diet - low sodium heart healthy   Complete by: As directed    Increase activity slowly   Complete by: As directed      Allergies as of 11/27/2019   No Known Allergies     Medication List    TAKE these medications   acetaminophen 325 MG tablet Commonly known as: TYLENOL Take 2 tablets (650 mg total) by mouth every 6 (six) hours as needed for mild pain or fever (or temp > 37.5 C (99.5 F)).   Alphagan P 0.1 % Soln Generic drug: brimonidine Place 1 drop into the right eye 2 (two) times daily.   amLODipine 10 MG tablet Commonly known as: NORVASC Take 1 tablet (10 mg total) by mouth daily.   amoxicillin-clavulanate 875-125 MG tablet Commonly known as: Augmentin Take 1 tablet by mouth 2 (two) times daily for 3 days.   aspirin EC 81 MG tablet Take 81 mg by mouth daily.   atorvastatin 40 MG tablet Commonly known as: LIPITOR Take 1 tablet (40 mg total) by mouth daily at 6 PM.   carvedilol 12.5 MG tablet Commonly known as: COREG Take 1 tablet (12.5 mg total) by mouth 2 (two) times daily with a meal.   clopidogrel 75 MG tablet Commonly known as: PLAVIX Take 75 mg by mouth daily.   folic acid 1 MG tablet Commonly known as: FOLVITE Take 1 tablet (1 mg total) by mouth daily.   furosemide 40 MG  tablet Commonly known as: LASIX Take 1 tablet (40 mg total) by mouth daily.   gabapentin 300 MG capsule Commonly known as: NEURONTIN Take 300 mg by mouth 3 (three) times daily.   guaiFENesin 600 MG 12 hr tablet Commonly known as: MUCINEX Take 1 tablet (600 mg total) by mouth 2 (two) times daily.   hydrALAZINE 10 MG tablet Commonly known as: APRESOLINE TAKE 1 TABLET(10 MG) BY MOUTH THREE TIMES DAILY   isosorbide mononitrate 60 MG 24 hr tablet Commonly known as: IMDUR Take 1 tablet (60 mg total) by mouth 2 (two) times daily.   multivitamin with minerals Tabs tablet Take 1 tablet by mouth daily.   nitroGLYCERIN 0.4 MG SL tablet Commonly known as: NITROSTAT Place 1 tablet (0.4 mg total) under the tongue every 5 (five) minutes as needed for chest pain.   omeprazole 20 MG capsule Commonly known as: PRILOSEC Take 1 capsule (20 mg total) by mouth daily.   Travatan Z 0.004 % Soln ophthalmic solution Generic drug: Travoprost (BAK Free) Place 1 drop into the right eye at bedtime.      No Known Allergies  Follow-up Information    Rozetta Nunnery, MD. Schedule an appointment as soon as possible for a visit in 1 week(s).   Specialty: Otolaryngology Contact information: [place] [address] [phone]                The results of significant diagnostics from this hospitalization (including imaging, microbiology, ancillary and laboratory) are listed below for reference.    Significant Diagnostic Studies: CT Chest Wo Contrast  Result Date: 11/23/2019 CLINICAL DATA:  86 year old female with chest pain, shortness of breath, weakness. Cough. EXAM: CT CHEST WITHOUT CONTRAST TECHNIQUE: Multidetector CT imaging of the chest was performed following the standard protocol without IV contrast. COMPARISON:  Portable chest today.  Chest CT 06/05/2009 and earlier. FINDINGS: Cardiovascular: Calcified coronary artery and aortic atherosclerosis. Stable  cardiac size, borderline to mild cardiomegaly. No pericardial effusion. Stable tortuosity of the aorta. Tortuous brachiocephalic artery at the right thoracic inlet and abutting the thyroid in the midline. Vascular patency is not evaluated in the absence of IV contrast. Mediastinum/Nodes: No mediastinal or hilar lymphadenopathy is evident in the absence of contrast. Lungs/Pleura: Stable lung volumes compared to the 2011 CTA. Atelectatic changes to the lower lobe airways. Superimposed bilateral lower lobe bronchial wall thickening. No consolidation or pleural effusion. Mild dependent atelectasis and other crowding of lung base markings. Upper Abdomen: Abdominal calcified atherosclerosis. Negative visible noncontrast liver, spleen, pancreas, adrenal glands, kidneys, and bowel in the upper abdomen. Musculoskeletal: Mild chronic T3 compression fracture is stable since 2011. However, T4, T5 and T7 compression fractures are new since that time. These are age indeterminate but favored to be chronic. No retropulsion or complicating features. No other acute osseous abnormality identified. IMPRESSION: 1. Atelectasis with superimposed lower lobe airway thickening. Consider bronchitis. 2. Age indeterminate T4, T5 and T7 compression fractures are new since 2011. If there is acute back pain and specific therapy such as vertebroplasty is desired, Lumbar MRI or Nuclear Medicine Whole-body Bone Scan would best determine acuity. 3. Calcified coronary artery and Aortic Atherosclerosis (ICD10-I70.0). Electronically Signed   By: Genevie Ann M.D.   On: 11/23/2019 20:09   NM Pulmonary Perfusion  Result Date: 11/24/2019 CLINICAL DATA:  Suspected pulmonary embolism.  CHF. EXAM: NUCLEAR MEDICINE PERFUSION LUNG SCAN TECHNIQUE: Perfusion images were obtained in multiple projections after intravenous injection of radiopharmaceutical. Ventilation scans intentionally deferred if perfusion scan and chest x-ray adequate for interpretation during  COVID 19 epidemic. RADIOPHARMACEUTICALS:  4.3 mCi Tc-70m MAA IV COMPARISON:  Chest imaging yesterday. FINDINGS: Perfusion study is normal.  No finding to suggest pulmonary emboli. IMPRESSION: Normal perfusion exam. Electronically Signed   By: Nelson Chimes M.D.   On: 11/24/2019 10:50   DG Chest Port 1 View  Result Date: 11/23/2019 CLINICAL DATA:  Weakness and cough EXAM: PORTABLE CHEST 1 VIEW COMPARISON:  None. FINDINGS: The heart size and mediastinal contours are within normal limits. The aortic knob calcifications are seen. Both lungs are clear. The visualized skeletal structures are unremarkable. IMPRESSION: No active disease. Electronically Signed   By: Prudencio Pair M.D.   On: 11/23/2019 17:50   DG Swallowing Func-Speech Pathology  Result Date: 11/24/2019 Objective Swallowing Evaluation: Type of Study: MBS-Modified Barium Swallow Study  Patient Details Name: [name] MRN: [id number] Date of Birth: 12-25-1932 Today's Date: 11/24/2019 Time: SLP Start Time (ACUTE ONLY): 1410 -SLP Stop Time (ACUTE ONLY): 1450 SLP Time Calculation (min) (ACUTE ONLY): 40 min Past Medical History: Past Medical History: Diagnosis Date . Anemia 04/03/2011 . Anginal pain (  [place])   a. 02/2014 MV: Low risk; b. 05/2017 MV: small area of inferior basal ischemia. EF 54% - low risk-->Med mgmt. Marland Kitchen Anxiety  . Aortic valve sclerosis w/ systolic murmur   a. 54/6503 AoV sclerosis w/o stenosis. . Arthritis   "feet" (06/02/2017) . Blind left eye 1999 . CKD (chronic kidney disease) stage 3, GFR 30-59 ml/min  . Depression  . Diastolic dysfunction   a. 10/2018 Echo: EF 60-65%, mild septal/posterior LVH. Impaired relaxation. Nl RV size/fxn. Mod dil LA. Trace TR. Triv AI. Mod AoV sclerosis w/o stenosis. Nl PASP. Marland Kitchen Exertional dyspnea  . GERD (gastroesophageal reflux disease)  . High cholesterol  . Hip fracture ([place]) 07/17/11  fall from 1-2 feet; left . Hypertension  . Ischemic stroke ([place]) 05/2017  a. 06/2017 Holter: RSR, PACs, no afib. . 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 Type II diabetes mellitus ([place])   not taking any medication (06/02/2017) Past Surgical History: Past Surgical History: Procedure Laterality Date . ENUCLEATION Left  . FRACTURE SURGERY   . HIP ARTHROPLASTY  07/18/2011  Procedure: ARTHROPLASTY BIPOLAR HIP;  Surgeon: Nita Sells, MD;  Location: [facility];  Service: Orthopedics;  Laterality: Left; . INTRAOCULAR PROSTHESES INSERTION  1999  left . LEFT HEART CATH AND CORS/GRAFTS ANGIOGRAPHY N/A 04/08/2019  Procedure: LEFT HEART CATH AND CORS/GRAFTS ANGIOGRAPHY;  Surgeon: Minna Merritts, MD;  Location: [facility];  Service: Cardiovascular;  Laterality: N/A; . [place]  "cancer" . TUBAL LIGATION  6014's HPI: 86 year old female with complicated PMH presented to the [facility] ER 11/10 with shortness of breath. On arrival reported chronic cough for "years", this has gotten worse as of late w/ eating (feels like she chokes more on food) and at night  w/ clear sputum production, chronic vocal hoarseness, but developed new shortness of breath the day of arrival. Hx includes prior CVA, base of tongue cancer with XRT 1999, chronic hoarseness. She was seen by Dr, Lucia Gaskins -last laryngoscopy 8/21 - glottic edema, small vocal cord nodules with normal vocal cord mobility otherwise. CT shows some bronchiectatic changes and bronchial thickening. Dx acute hypoxic respiratory failure, acute on chronic cough, aspiration pna.  MBS ordered per PCCM.  Subjective: alert, good historian Assessment / Plan / Recommendation CHL IP CLINICAL IMPRESSIONS 11/24/2019 Clinical Impression Pt presents with a chronic dysphagia with deterioration in the last few years, attributable to the long-term effects of radiation tx to the base of tongue in 1999.  Oral mechanism exam reveals tongue atrophy, inability to extend and poor lateralization, reduced retraction of tongue.  Effects of this are seen with decreased oral thrust of boluses to the back  of the mouth and decreased cohesion.  Swallow trigger is timely.  There is reduced pharyngeal stripping.  Effects of tongue base/pharyngeal weakness lead to diffuse residue.  Additionally, there is retention of barium in the proximal esophagus.  There was notable trace aspiration during and after the swallow with thin liquids, particularly when mixing solid/liquid consistencies.  Aspiration was inconsistently sensed - cough response not reliably elicited.  Head turn to the left resulted in marginal improvement of clearance of residue.   MBS was viewed in real time by the pt and then afterwards to show the areas of PO retention in throat and upper esophagus.  We talked about the long-term unfortunate effects of radiation to the throat.  She is able to manage POs better when she alternates one bite of solid with a sip  of liquid to help clear it through pharynx/esophagus.  For today, recommend changing diet to full liquids; follow strategies.  Ms. Cambre verbalized agreement. SLP will follow acutely and to help pt with a longer-term plan to manage swallowing.     SLP Visit Diagnosis Dysphagia, oropharyngeal phase (R13.12) Attention and concentration deficit following -- Frontal lobe and executive function deficit following -- Impact on safety and function Moderate aspiration risk   CHL IP TREATMENT RECOMMENDATION 11/24/2019 Treatment Recommendations Therapy as outlined in treatment plan below   Prognosis 11/24/2019 Prognosis for Safe Diet Advancement Guarded Barriers to Reach Goals -- Barriers/Prognosis Comment -- CHL IP DIET RECOMMENDATION 11/24/2019 SLP Diet Recommendations Other (Comment) Liquid Administration via Cup;Straw Medication Administration Crushed with puree Compensations Follow solids with liquid Postural Changes Remain semi-upright after after feeds/meals (Comment)   CHL IP OTHER RECOMMENDATIONS 11/24/2019 Recommended Consults -- Oral Care Recommendations Oral care BID Other Recommendations --   CHL  IP FOLLOW UP RECOMMENDATIONS 11/24/2019 Follow up Recommendations Other (comment)   CHL IP FREQUENCY AND DURATION 11/24/2019 Speech Therapy Frequency (ACUTE ONLY) min 3x week Treatment Duration 1 week      CHL IP ORAL PHASE 11/24/2019 Oral Phase Impaired Oral - Pudding Teaspoon -- Oral - Pudding Cup -- Oral - Honey Teaspoon -- Oral - Honey Cup -- Oral - Nectar Teaspoon -- Oral - Nectar Cup -- Oral - Nectar Straw -- Oral - Thin Teaspoon -- Oral - Thin Cup Weak lingual manipulation;Lingual pumping;Incomplete tongue to palate contact;Decreased bolus cohesion;Premature spillage Oral - Thin Straw -- Oral - Puree Weak lingual manipulation;Lingual pumping;Incomplete tongue to palate contact;Decreased bolus cohesion;Premature spillage Oral - Mech Soft Weak lingual manipulation;Lingual pumping;Incomplete tongue to palate contact;Decreased bolus cohesion;Premature spillage Oral - Regular -- Oral - Multi-Consistency -- Oral - Pill -- Oral Phase - Comment --  CHL IP PHARYNGEAL PHASE 11/24/2019 Pharyngeal Phase Impaired Pharyngeal- Pudding Teaspoon -- Pharyngeal -- Pharyngeal- Pudding Cup -- Pharyngeal -- Pharyngeal- Honey Teaspoon -- Pharyngeal -- Pharyngeal- Honey Cup -- Pharyngeal -- Pharyngeal- Nectar Teaspoon -- Pharyngeal -- Pharyngeal- Nectar Cup -- Pharyngeal -- Pharyngeal- Nectar Straw -- Pharyngeal -- Pharyngeal- Thin Teaspoon -- Pharyngeal -- Pharyngeal- Thin Cup Delayed swallow initiation-vallecula;Reduced pharyngeal peristalsis;Reduced tongue base retraction;Penetration/Apiration after swallow;Trace aspiration;Pharyngeal residue - pyriform;Pharyngeal residue - valleculae Pharyngeal Material enters airway, passes BELOW cords without attempt by patient to eject out (silent aspiration);Material enters airway, passes BELOW cords and not ejected out despite cough attempt by patient Pharyngeal- Thin Straw -- Pharyngeal -- Pharyngeal- Puree Delayed swallow initiation-vallecula;Reduced pharyngeal peristalsis;Reduced tongue  base retraction;Pharyngeal residue - pyriform;Pharyngeal residue - valleculae Pharyngeal Material does not enter airway Pharyngeal- Mechanical Soft Delayed swallow initiation-vallecula;Reduced pharyngeal peristalsis;Reduced tongue base retraction;Pharyngeal residue - pyriform;Pharyngeal residue - valleculae Pharyngeal Material does not enter airway Pharyngeal- Regular -- Pharyngeal -- Pharyngeal- Multi-consistency -- Pharyngeal -- Pharyngeal- Pill -- Pharyngeal -- Pharyngeal Comment --  CHL IP CERVICAL ESOPHAGEAL PHASE 11/24/2019 Cervical Esophageal Phase (No Data) Pudding Teaspoon -- Pudding Cup -- Honey Teaspoon -- Honey Cup -- Nectar Teaspoon -- Nectar Cup -- Nectar Straw -- Thin Teaspoon -- Thin Cup -- Thin Straw -- Puree -- Mechanical Soft -- Regular -- Multi-consistency -- Pill -- Cervical Esophageal Comment -- Juan Quam Laurice 11/24/2019, 3:41 PM              VAS [place] LOWER EXTREMITY VENOUS (DVT)  Result Date: 11/24/2019  Lower Venous DVT Study Indications: Elevated D dimer.  Comparison Study: No prior. Performing Technologist: Oda Cogan RDMS, RVT  Examination Guidelines: A complete evaluation includes  B-mode imaging, spectral Doppler, color Doppler, and power Doppler as needed of all accessible portions of each vessel. Bilateral testing is considered an integral part of a complete examination. Limited examinations for reoccurring indications may be performed as noted. The reflux portion of the exam is performed with the patient in reverse Trendelenburg.  +---------+---------------+---------+-----------+----------+--------------+ RIGHT    CompressibilityPhasicitySpontaneityPropertiesThrombus Aging +---------+---------------+---------+-----------+----------+--------------+ CFV      Full           Yes      Yes                                 +---------+---------------+---------+-----------+----------+--------------+ SFJ      Full                                                         +---------+---------------+---------+-----------+----------+--------------+ FV Prox  Full                                                        +---------+---------------+---------+-----------+----------+--------------+ FV Mid   Full                                                        +---------+---------------+---------+-----------+----------+--------------+ FV DistalFull                                                        +---------+---------------+---------+-----------+----------+--------------+ PFV      Full                                                        +---------+---------------+---------+-----------+----------+--------------+ POP      Full           Yes      Yes                                 +---------+---------------+---------+-----------+----------+--------------+ PTV      Full                                                        +---------+---------------+---------+-----------+----------+--------------+ PERO     Full                                                        +---------+---------------+---------+-----------+----------+--------------+   +---------+---------------+---------+-----------+----------+--------------+  LEFT     CompressibilityPhasicitySpontaneityPropertiesThrombus Aging +---------+---------------+---------+-----------+----------+--------------+ CFV      Full           Yes      Yes                                 +---------+---------------+---------+-----------+----------+--------------+ SFJ      Full                                                        +---------+---------------+---------+-----------+----------+--------------+ FV Prox  Full                                                        +---------+---------------+---------+-----------+----------+--------------+ FV Mid   Full                                                         +---------+---------------+---------+-----------+----------+--------------+ FV DistalFull                                                        +---------+---------------+---------+-----------+----------+--------------+ PFV      Full                                                        +---------+---------------+---------+-----------+----------+--------------+ POP      Full           Yes      Yes                                 +---------+---------------+---------+-----------+----------+--------------+ PTV      Full                                                        +---------+---------------+---------+-----------+----------+--------------+ PERO     Full                                                        +---------+---------------+---------+-----------+----------+--------------+     Summary: BILATERAL: - No evidence of deep vein thrombosis seen in the lower extremities, bilaterally. -No evidence of popliteal cyst, bilaterally.   *See table(s) above for measurements and observations. Electronically signed by Monica Martinez MD on 11/24/2019 at 4:23:43 PM.  Final    DG ESOPHAGUS W SINGLE CM (SOL OR THIN BA)  Result Date: 11/25/2019 CLINICAL DATA:  Stroke. Throat cancer with radiation. Chronic cough. Dysphagia. Chronic coarseness. EXAM: ESOPHOGRAM/BARIUM SWALLOW TECHNIQUE: Single contrast examination was performed using thin barium and barium tablet. FLUOROSCOPY TIME:  Fluoroscopy Time:  2 minutes 42 second Radiation Exposure Index (if provided by the fluoroscopic device): Number of Acquired Spot Images: 0 COMPARISON:  None. FINDINGS: Decreased esophageal motility. No aspiration. No esophageal stricture or mass lesion. Barium tablet passed into the stomach without obstruction. IMPRESSION: Poor esophageal motility.  No stricture or mass. Barium tablet passed into the stomach. Electronically Signed   By: Franchot Gallo M.D.   On: 11/25/2019 15:55     Microbiology: Recent Results (from the past 240 hour(s))  Respiratory Panel by RT PCR (Flu A&B, Covid) - Nasopharyngeal Swab     Status: None   Collection Time: 11/23/19  3:20 PM   Specimen: Nasopharyngeal Swab  Result Value Ref Range Status   SARS Coronavirus 2 by RT PCR NEGATIVE NEGATIVE Final    Comment: (NOTE) SARS-CoV-2 target nucleic acids are NOT DETECTED.  The SARS-CoV-2 RNA is generally detectable in upper respiratoy specimens during the acute phase of infection. The lowest concentration of SARS-CoV-2 viral copies this assay can detect is 131 copies/mL. A negative result does not preclude SARS-Cov-2 infection and should not be used as the sole basis for treatment or other patient management decisions. A negative result may occur with  improper specimen collection/handling, submission of specimen other than nasopharyngeal swab, presence of viral mutation(s) within the areas targeted by this assay, and inadequate number of viral copies (<131 copies/mL). A negative result must be combined with clinical observations, patient history, and epidemiological information. The expected result is Negative.  Fact Sheet for Patients:  PinkCheek.be  Fact Sheet for Healthcare Providers:  GravelBags.it  This test is no t yet approved or cleared by the [place] FDA and  has been authorized for detection and/or diagnosis of SARS-CoV-2 by FDA under an Emergency Use Authorization (EUA). This EUA will remain  in effect (meaning this test can be used) for the duration of the COVID-19 declaration under Section 564(b)(1) of the Act, 21 U.S.C. section 360bbb-3(b)(1), unless the authorization is terminated or revoked sooner.     Influenza A by PCR NEGATIVE NEGATIVE Final   Influenza B by PCR NEGATIVE NEGATIVE Final    Comment: (NOTE) The Xpert Xpress SARS-CoV-2/FLU/RSV assay is intended as an aid in  the diagnosis of  influenza from Nasopharyngeal swab specimens and  should not be used as a sole basis for treatment. Nasal washings and  aspirates are unacceptable for Xpert Xpress SARS-CoV-2/FLU/RSV  testing.  Fact Sheet for Patients: PinkCheek.be  Fact Sheet for Healthcare Providers: GravelBags.it  This test is not yet approved or cleared by the [place] FDA and  has been authorized for detection and/or diagnosis of SARS-CoV-2 by  FDA under an Emergency Use Authorization (EUA). This EUA will remain  in effect (meaning this test can be used) for the duration of the  Covid-19 declaration under Section 564(b)(1) of the Act, 21  U.S.C. section 360bbb-3(b)(1), unless the authorization is  terminated or revoked. Performed at [facility], [place] [address] [place],  [address]      Labs: Basic Metabolic Panel: Recent Labs  Lab 11/23/19 1513 11/23/19 1513 11/24/19 0226 11/24/19 0842 11/25/19 0419 11/26/19 0438 11/27/19 0958  NA 139   < > 142 143 141 140  140  K 3.5   < > 3.3* 3.4* 4.0 3.8 3.9  CL 104  --  108  --  106 107 111  CO2 23  --  26  --  22 24 21*  GLUCOSE 210*  --  85  --  80 78 99  BUN 14  --  15  --  17 14 11   CREATININE 1.76*  --  1.80*  --  1.88* 1.62* 1.35*  CALCIUM 9.0  --  8.9  --  8.9 8.5* 8.5*   < > = values in this interval not displayed.   CBC: Recent Labs  Lab 11/23/19 1513 11/24/19 0226 11/24/19 0842 11/26/19 0438  WBC 5.6 6.1  --  5.0  HGB 11.2* 10.7* 10.9* 10.1*  HCT 36.2 34.2* 32.0* 31.8*  MCV 90.7 88.8  --  88.8  PLT 271 242  --  243    BNP (last 3 results) Recent Labs    01/20/19 0052 04/07/19 1307 11/23/19 1836  BNP 84.3 1,028.0* 581.6*     CBG: Recent Labs  Lab 11/26/19 0601 11/26/19 1128 11/26/19 1612 11/26/19 2010 11/27/19 0646  GLUCAP 80 109* 113* 115* 81       Signed:  Oswald Hillock MD.  [facility] 11/27/2019, 11:35 AM

## 2019-11-30 ENCOUNTER — Ambulatory Visit (INDEPENDENT_AMBULATORY_CARE_PROVIDER_SITE_OTHER): Payer: Medicare HMO | Admitting: Otolaryngology

## 2019-11-30 ENCOUNTER — Other Ambulatory Visit: Payer: Self-pay

## 2019-11-30 VITALS — Temp 97.3°F

## 2019-11-30 DIAGNOSIS — K219 Gastro-esophageal reflux disease without esophagitis: Secondary | ICD-10-CM

## 2019-11-30 DIAGNOSIS — R490 Dysphonia: Secondary | ICD-10-CM | POA: Diagnosis not present

## 2019-11-30 NOTE — Progress Notes (Signed)
HPI: [name] is a 86 y.o. female who returns today for evaluation of chronic hoarseness.  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  She has been chronically hoarse for several years.  She has history of laryngeal pharyngeal reflux and takes antiacid medication in the morning.  She denies any sore throat.  She is having no airway problems today but does have chronic hoarseness..  Past Medical History:  Diagnosis Date  . Anemia 04/03/2011  . Anginal pain ([place])    a. 02/2014 MV: Low risk; b. 05/2017 MV: small area of inferior basal ischemia. EF 54% - low risk-->Med mgmt.  Marland Kitchen Anxiety   . Aortic valve sclerosis w/ systolic murmur    a. 02/6376 AoV sclerosis w/o stenosis.  . Arthritis    "feet" (06/02/2017)  . Blind left eye 1999  . CKD (chronic kidney disease) stage 3, GFR 30-59 ml/min   . Depression   . Diastolic dysfunction    a. 10/2018 Echo: EF 60-65%, mild septal/posterior LVH. Impaired relaxation. Nl RV size/fxn. Mod dil LA. Trace TR. Triv AI. Mod AoV sclerosis w/o stenosis. Nl PASP.  Marland Kitchen Exertional dyspnea   . GERD (gastroesophageal reflux disease)   . High cholesterol   . Hip fracture ([place]) 07/17/11   fall from 1-2 feet; left  . Hypertension   . Ischemic stroke ([place]) 05/2017   a. 06/2017 Holter: RSR, PACs, no afib.  . Prosthetic eye globe 1999   "from diabetic; left eye"  . Type II diabetes mellitus ([place])    not taking any medication (06/02/2017)   Past Surgical History:  Procedure Laterality Date  . ENUCLEATION Left   . FRACTURE SURGERY    . HIP ARTHROPLASTY  07/18/2011   Procedure: ARTHROPLASTY BIPOLAR HIP;  Surgeon: Nita Sells, MD;  Location: [facility];  Service: Orthopedics;  Laterality: Left;  . INTRAOCULAR PROSTHESES INSERTION  1999   left  . LEFT HEART CATH AND CORS/GRAFTS ANGIOGRAPHY N/A 04/08/2019   Procedure: LEFT HEART CATH AND CORS/GRAFTS ANGIOGRAPHY;  Surgeon: Minna Merritts, MD;   Location: [facility];  Service: Cardiovascular;  Laterality: N/A;  . [place]   "cancer"  . TUBAL LIGATION  1970's   Social History   Socioeconomic History  . Marital status: Single    Spouse name: Not on file  . Number of children: Not on file  . Years of education: Not on file  . Highest education level: Not on file  Occupational History  . Not on file  Tobacco Use  . Smoking status: Former Smoker    Packs/day: 1.50    Years: 47.00    Pack years: 70.50    Types: Cigarettes    Quit date: 09/13/1997    Years since quitting: 22.2  . Smokeless tobacco: Never Used  Vaping Use  . Vaping Use: Never used  Substance and Sexual Activity  . Alcohol use: Not Currently    Alcohol/week: 17.0 standard drinks    Types: 17 Shots of liquor per week    Comment: Quit 03/2018  . Drug use: No    Types: Marijuana, Heroin    Comment: 06/02/2017  "last drug use was in the 1980s." Prev injected heroin.  Marland Kitchen Sexual activity: Not Currently  Other Topics Concern  . Not on file  Social History Narrative   Originally from [place] but moved to [facility] @ age 86.  Moved to [place] ~ 2011  to be near grandchildren.   Social Determinants of Health   Financial Resource Strain:   . Difficulty of Paying Living Expenses: Not on file  Food Insecurity:   . Worried About Charity fundraiser in the Last Year: Not on file  . Ran Out of Food in the Last Year: Not on file  Transportation Needs:   . Lack of Transportation (Medical): Not on file  . Lack of Transportation (Non-Medical): Not on file  Physical Activity:   . Days of Exercise per Week: Not on file  . Minutes of Exercise per Session: Not on file  Stress:   . Feeling of Stress : Not on file  Social Connections:   . Frequency of Communication with Friends and Family: Not on file  . Frequency of Social Gatherings with Friends and Family: Not on file  . Attends Religious Services: Not on file  . Active Member of Clubs or  Organizations: Not on file  . Attends Archivist Meetings: Not on file  . Marital Status: Not on file   Family History  Problem Relation Age of Onset  . Diabetes type II Mother   . Diabetes type II Father   . Diabetes type II Sister   . Colon cancer Neg Hx    No Known Allergies Prior to Admission medications   Medication Sig Start Date End Date Taking? Authorizing Provider  acetaminophen (TYLENOL) 325 MG tablet Take 2 tablets (650 mg total) by mouth every 6 (six) hours as needed for mild pain or fever (or temp > 37.5 C (99.5 F)). 06/05/17  Yes Elgergawy, Silver Huguenin, MD  ALPHAGAN P 0.1 % SOLN Place 1 drop into the right eye 2 (two) times daily. 10/10/15  Yes [provider]  amoxicillin-clavulanate (AUGMENTIN) 875-125 MG tablet Take 1 tablet by mouth 2 (two) times daily for 3 days. 11/27/19 11/30/19 Yes Oswald Hillock, MD  aspirin EC 81 MG tablet Take 81 mg by mouth daily.    Yes [provider]  atorvastatin (LIPITOR) 40 MG tablet Take 1 tablet (40 mg total) by mouth daily at 6 PM. 06/08/19  Yes Pahwani, Einar Grad, MD  clopidogrel (PLAVIX) 75 MG tablet Take 75 mg by mouth daily.   Yes [provider]  folic acid (FOLVITE) 1 MG tablet Take 1 tablet (1 mg total) by mouth daily. 06/06/17  Yes Elgergawy, Silver Huguenin, MD  gabapentin (NEURONTIN) 300 MG capsule Take 300 mg by mouth 3 (three) times daily.    Yes [provider]  guaiFENesin (MUCINEX) 600 MG 12 hr tablet Take 1 tablet (600 mg total) by mouth 2 (two) times daily. 11/27/19  Yes Oswald Hillock, MD  hydrALAZINE (APRESOLINE) 10 MG tablet TAKE 1 TABLET(10 MG) BY MOUTH THREE TIMES DAILY 08/01/19  Yes Belva Crome, MD  Multiple Vitamin (MULTIVITAMIN WITH MINERALS) TABS tablet Take 1 tablet by mouth daily. 06/06/17  Yes Elgergawy, Silver Huguenin, MD  omeprazole (PRILOSEC) 20 MG capsule Take 1 capsule (20 mg total) by mouth daily. 02/11/19  Yes Isaiah Serge, NP  TRAVATAN Z 0.004 % SOLN ophthalmic solution Place 1  drop into the right eye at bedtime.  11/24/12  Yes [provider]  amLODipine (NORVASC) 10 MG tablet Take 1 tablet (10 mg total) by mouth daily. 06/09/19 11/24/19  Darliss Cheney, MD  carvedilol (COREG) 12.5 MG tablet Take 1 tablet (12.5 mg total) by mouth 2 (two) times daily with a meal. 06/08/19 11/24/19  Darliss Cheney, MD  furosemide (  LASIX) 40 MG tablet Take 1 tablet (40 mg total) by mouth daily. 06/08/19 11/24/19  Darliss Cheney, MD  isosorbide mononitrate (IMDUR) 60 MG 24 hr tablet Take 1 tablet (60 mg total) by mouth 2 (two) times daily. 06/08/19 11/24/19  Darliss Cheney, MD  nitroGLYCERIN (NITROSTAT) 0.4 MG SL tablet Place 1 tablet (0.4 mg total) under the tongue every 5 (five) minutes as needed for chest pain. 02/11/19 11/24/19  Isaiah Serge, NP     Positive ROS: Otherwise negative  All other systems have been reviewed and were otherwise negative with the exception of those mentioned in the HPI and as above.  Physical Exam: Constitutional: Alert, well-appearing, no acute distress.  No airway problems but moderate hoarseness which has been chronic. Ears: External ears without lesions or tenderness. Ear canals are clear bilaterally with intact, clear TMs.  Nasal: External nose without lesions. Septum midline. Clear nasal passages bilaterally. Oral: Lips and gums without lesions. Tongue and palate mucosa without lesions. Posterior oropharynx clear.  She does have a dry mouth.  Indirect laryngoscopy revealed a clear base of tongue vallecula and epiglottis.  Had limited visualization of the vocal cords but vocal cords appeared clear.  She had mild supraglottic edema with moderate mucus buildup. Fiberoptic laryngoscopy was performed to the left nostril.  The nasopharynx was clear.  The base of tongue and epiglottis were normal.  The epiglottis tended to live more posteriorly.  On evaluation of vocal cords she had mild vocal cord edema but no specific vocal cord lesions.  Vocal cords had  normal mobility.  She had moderate supraglottic edema and moderate mucus that cleared easily with coughing. Neck: No palpable adenopathy or masses. Respiratory: Breathing comfortably  Skin: No facial/neck lesions or rash noted.  Laryngoscopy  Date/Time: 11/30/2019 12:20 PM Performed by: Rozetta Nunnery, MD Authorized by: Rozetta Nunnery, MD   Consent:    Consent obtained:  Verbal   Consent given by:  Patient Procedure details:    Indications: hoarseness, dysphagia, or aspiration     Medication:  Afrin   Instrument: flexible fiberoptic laryngoscope     Scope location: left nare   Sinus:    Left nasopharynx: normal   Mouth:    Oropharynx: normal     Vallecula: normal     Base of tongue: normal     Epiglottis: normal   Throat:    True vocal cords: normal   Comments:     Patient with moderate supraglottic edema and mucus buildup but no mucosal lesions identified.  Vocal cords were slightly edematous but no specific vocal cord lesions identified.    Assessment: Chronic hoarseness Laryngeal pharyngeal reflux.  Plan: Recommended taking her antiacid medication before dinner instead of first thing in the morning. She will follow-up in 6 months for recheck.   Radene Journey, MD

## 2019-12-06 NOTE — Progress Notes (Deleted)
CARDIOLOGY OFFICE NOTE  Date:  12/19/2019    [name] Date of Birth: 1932/01/20 Medical Record #[id number]  PCP:  Dierdre Harness, FNP  Cardiologist:  Zack Seal chief complaint on file.   History of Present Illness: [name] is a 86 y.o. female who presents today for a follow up visit. Seen for Dr. Tamala Julian.   She has a history of chronic diastolic HF, chest pain with prior Muyoview in 2019 with small inferior basal ischemia/low risk - managed medically, CKD, prior CVA, HTN, HLD, type 2 DM, prior tongue cancer with radiation in [place] back in 1990's, tobacco abuse and GERD. Has severe one vessel CAD with chronic angina.   She was admitted in 10/2018 with chest pain and hypoxia. EF was normal on Echocardiogram. ADDimer was elevated and VQ scan was low probability and venous US was neg for DVT. Hs-Troponin was mildly elevated without clear trend. She was started on Lasix to cover for possible volume overload. Outpatient workup for COPD was recommended.   When seen in clinic by Sande Rives, PA-C 11/17/2018 her creatinine was worse on labs and Dr. Tamala Julian recommended stopping Lasix and increasing Imdur. A Myoview had been ordered but Dr. Tamala Julian recommended canceling this. Cardiac catheterization is being avoided due to poor renal function.   She was last seen almost a year ago by Mickel Baas - stable 2 pillow orthopnea, was having some exertional chest tightness - Imdur was increased - BP elevated. Noted that takes the bus to the office and has to walk from the bus stop. Using sl NTG.   Recent admission for cough and hypoxia. Noted age indeterminate T4-T5 and T7 compression fractures. VQ was unremarkable. DVT negative. She was treated for possible aspiration pneumonia. Noted glottic edema and small nodules on vocal cords. Noted to have poor esophageal dysmotility.   Comes in today. Here with   Past Medical History:  Diagnosis Date  . Anemia 04/03/2011  . Anginal  pain ([place])    a. 02/2014 MV: Low risk; b. 05/2017 MV: small area of inferior basal ischemia. EF 54% - low risk-->Med mgmt.  Marland Kitchen Anxiety   . Aortic valve sclerosis w/ systolic murmur    a. 62/7035 AoV sclerosis w/o stenosis.  . Arthritis    "feet" (06/02/2017)  . Blind left eye 1999  . CKD (chronic kidney disease) stage 3, GFR 30-59 ml/min   . Depression   . Diastolic dysfunction    a. 10/2018 Echo: EF 60-65%, mild septal/posterior LVH. Impaired relaxation. Nl RV size/fxn. Mod dil LA. Trace TR. Triv AI. Mod AoV sclerosis w/o stenosis. Nl PASP.  Marland Kitchen Exertional dyspnea   . GERD (gastroesophageal reflux disease)   . High cholesterol   . Hip fracture ([place]) 07/17/11   fall from 1-2 feet; left  . Hypertension   . Ischemic stroke ([place]) 05/2017   a. 06/2017 Holter: RSR, PACs, no afib.  . Prosthetic eye globe 1999   "from diabetic; left eye"  . Type II diabetes mellitus ([place])    not taking any medication (06/02/2017)    Past Surgical History:  Procedure Laterality Date  . ENUCLEATION Left   . FRACTURE SURGERY    . HIP ARTHROPLASTY  07/18/2011   Procedure: ARTHROPLASTY BIPOLAR HIP;  Surgeon: Nita Sells, MD;  Location: [facility];  Service: Orthopedics;  Laterality: Left;  . INTRAOCULAR PROSTHESES INSERTION  1999   left  . LEFT HEART CATH AND CORS/GRAFTS ANGIOGRAPHY N/A 04/08/2019   Procedure: LEFT  HEART CATH AND CORS/GRAFTS ANGIOGRAPHY;  Surgeon: Minna Merritts, MD;  Location: [facility];  Service: Cardiovascular;  Laterality: N/A;  . [place]   "cancer"  . TUBAL LIGATION  1970's     Medications: No outpatient medications have been marked as taking for the 12/20/19 encounter (Appointment) with Burtis Junes, NP.     Allergies: No Known Allergies  Social History: The patient  reports that she quit smoking about 22 years ago. Her smoking use included cigarettes. She has a 70.50 pack-year smoking history. She has never used smokeless tobacco. She reports  previous alcohol use of about 17.0 standard drinks of alcohol per week. She reports that she does not use drugs.   Family History: The patient's ***family history includes Diabetes type II in her father, mother, and sister.   Review of Systems: Please see the history of present illness.   All other systems are reviewed and negative.   Physical Exam: VS:  There were no vitals taken for this visit. Marland Kitchen  BMI There is no height or weight on file to calculate BMI.  Wt Readings from Last 3 Encounters:  11/27/19 139 lb 9.6 oz (63.3 kg)  06/07/19 151 lb 3.8 oz (68.6 kg)  04/13/19 152 lb 12.8 oz (69.3 kg)    General: Pleasant. Well developed, well nourished and in no acute distress.   HEENT: Normal.  Neck: Supple, no JVD, carotid bruits, or masses noted.  Cardiac: ***Regular rate and rhythm. No murmurs, rubs, or gallops. No edema.  Respiratory:  Lungs are clear to auscultation bilaterally with normal work of breathing.  GI: Soft and nontender.  MS: No deformity or atrophy. Gait and ROM intact.  Skin: Warm and dry. Color is normal.  Neuro:  Strength and sensation are intact and no gross focal deficits noted.  Psych: Alert, appropriate and with normal affect.   LABORATORY DATA:  EKG:  EKG {ACTION; IS/IS TDD:[id number]} ordered today.  Personally reviewed by me. This demonstrates ***.  Lab Results  Component Value Date   WBC 5.0 11/26/2019   HGB 10.1 (L) 11/26/2019   HCT 31.8 (L) 11/26/2019   PLT 243 11/26/2019   GLUCOSE 99 11/27/2019   CHOL 169 04/08/2019   TRIG 154 (H) 04/08/2019   HDL 47 04/08/2019   LDLCALC 91 04/08/2019   ALT 27 04/07/2019   AST 47 (H) 04/07/2019   NA 140 11/27/2019   K 3.9 11/27/2019   CL 111 11/27/2019   CREATININE 1.35 (H) 11/27/2019   BUN 11 11/27/2019   CO2 21 (L) 11/27/2019   TSH 2.40 06/10/2017   INR 0.91 06/02/2017   HGBA1C 5.7 (H) 11/24/2019   MICROALBUR 2.63 (H) 02/28/2011     BNP (last 3 results) Recent Labs    01/20/19 0052  04/07/19 1307 11/23/19 1836  BNP 84.3 1,028.0* 581.6*    ProBNP (last 3 results) No results for input(s): PROBNP in the last 8760 hours.   Other Studies Reviewed Today:  Echo 11/02/18 IMPRESSIONS  1. Left ventricular ejection fraction, by visual estimation, is 60 to 65%. The left ventricle has normal function. Normal left ventricular size. Left ventricular septal wall thickness was mildly increased. Mildly increased left ventricular posterior  wall thickness. 2. Left ventricular diastolic Doppler parameters are consistent with impaired relaxation pattern of LV diastolic filling. 3. Elevated left ventricular end-diastolic pressure. 4. Global right ventricle has normal systolic function.The right ventricular size is normal. No increase in right ventricular wall thickness. 5. Left atrial  size was moderately dilated. 6. Right atrial size was normal. 7. Moderate mitral annular calcification. 8. The mitral valve is normal in structure. Trace mitral valve regurgitation. No evidence of mitral stenosis. 9. The tricuspid valve is normal in structure. Tricuspid valve regurgitation is mild. 10. The aortic valve is tricuspid. Aortic valve regurgitation is trivial by color flow Doppler. Moderate aortic valve sclerosis/calcification without any evidence of aortic stenosis. 11. The pulmonic valve was normal in structure. Pulmonic valve regurgitation is mild by color flow Doppler. 12. Normal pulmonary artery systolic pressure. 13. The inferior vena cava is normal in size with greater than 50% respiratory variability, suggesting right atrial pressure of 3 mmHg.     ASSESSMENT AND PLAN:  1.  Angina with some improvement with increase of imdur, sl NTG does relieve pain.  Will increase imdur to 120 in QM and 60 in PM and follow up with Dr. Tamala Julian, concern with cath due to CKD - will increase hydralazine as well.  Medical therapy for now and hope will control angina.  2.  HTN  uncontrolled with increase hydralazine to 20 mg TID    3.  Chronic diastolic HF, euvolemic today no SOB  4.  CKD-4 with elevated K+ - she has seen nephrology.   5.  HLD continue statin.         1. Acute hypoxemic respiratory failure-resolved, likely from aspiration pneumonia D-dimer was elevated at 1.98.CTA chest with contrast could not be obtained due to elevated creatinine. VQ scan was unremarkable, venous duplex of lower extremities negative for DVT. CTA chest without contrast did not show significant abnormality. ABG obtained shows pH 7.441, PCO2 39.8, PO2 73. Pulmonology was consulted, and patient started on IV Unasyn for possible aspiration pneumonia. 2. Aspiration pneumonia-pulmonology was consulted and there was concern for possible aspiration, speech therapy was consulted. Patient recently had direct laryngoscopy by ENT which showed glottic edema and small nodules on the vocal cords. Patient started on IV Unasyn. She has significantly improved, no longer requiring oxygen.  Will discharge patient on Augmentin 1 tablet p.o. twice daily for 3 more days. 3. Dysphagia-MBS was performed by speech therapy, recommended esophagram. Esophagram shows no strictures or mass. It shows poor esophageal dysmotility. Patient is on regular diet with thin liquids. 4. Hoarseness of voice-patient recently was seen by ENT and underwent direct laryngoscopy which showed glottic edema and small nodules on the vocal cords. Follow-up ENT as outpatient in 1 week. 5. CKD stage IIIb-patient baseline creatinine 1.46 as of May 2021, she presents with creatinine 1.76. Today creatinine is 1.88. Patient takes Lasix at home. Lasix was on hold.   Today creatinine is 1.35.  Will restart Lasix for chronic diastolic CHF. 6. Chronic diastolic CHF-patient's last EF in March 2021 as per echo was 55 to 60%. Lasix is on hold due to volume depletion on admission. Patient was placed on IV normal saline.  Patient  takes Lasix at home.  Continue Lasix 40 mg daily. 7. CAD with chronic angina-chronic, stable. Continue aspirin, Coreg, atorvastatin, Imdur. 8. Diabetes mellitus type 2-continue sliding scale insulin with NovoLog. Hemoglobin A1c is 5.7.  9. Hypertension-blood pressure stable, continue amlodipine, Coreg, Imdur. 10. T5, T7, T7 compression fractures-seen on CT imaging, indeterminate age. Patient is not having any back pain at this time.  11. History of CVA-continue aspirin, statin. 12. Hypokalemia-replete   Current medicines are reviewed with the patient today.  The patient does not have concerns regarding medicines other than what has been noted above.  The  following changes have been made:  See above.  Labs/ tests ordered today include:   No orders of the defined types were placed in this encounter.    Disposition:   FU with *** in {gen number [id number]} {Days to years:10300}.   Patient is agreeable to this plan and will call if any problems develop in the interim.   SignedTruitt Merle, NP  12/19/2019 8:38 AM  [facility] [address] [place], [place]  [address] Phone: [phone] Fax: [phone]

## 2019-12-14 ENCOUNTER — Other Ambulatory Visit: Payer: Self-pay | Admitting: Cardiology

## 2019-12-20 ENCOUNTER — Ambulatory Visit: Payer: Medicare HMO | Admitting: Nurse Practitioner

## 2019-12-23 ENCOUNTER — Other Ambulatory Visit: Payer: Self-pay

## 2019-12-23 MED ORDER — ISOSORBIDE MONONITRATE ER 60 MG PO TB24
60.0000 mg | ORAL_TABLET | Freq: Two times a day (BID) | ORAL | 0 refills | Status: DC
Start: 2019-12-23 — End: 2020-04-24

## 2019-12-23 MED ORDER — ISOSORBIDE MONONITRATE ER 60 MG PO TB24
60.0000 mg | ORAL_TABLET | Freq: Two times a day (BID) | ORAL | 1 refills | Status: DC
Start: 2019-12-23 — End: 2019-12-23

## 2019-12-23 NOTE — Addendum Note (Signed)
Addended by: Carter Kitten D on: 12/23/2019 02:40 PM   Modules accepted: Orders

## 2020-02-21 ENCOUNTER — Telehealth: Payer: Self-pay

## 2020-02-21 NOTE — Telephone Encounter (Signed)
NOTES ON FILE FROM [facility] [phone] SENT REFERRAL TO SCHEDULING 

## 2020-04-22 ENCOUNTER — Other Ambulatory Visit: Payer: Self-pay | Admitting: Interventional Cardiology

## 2020-04-24 ENCOUNTER — Other Ambulatory Visit: Payer: Self-pay

## 2020-04-24 MED ORDER — ISOSORBIDE MONONITRATE ER 60 MG PO TB24
60.0000 mg | ORAL_TABLET | Freq: Two times a day (BID) | ORAL | 0 refills | Status: DC
Start: 2020-04-24 — End: 2020-05-24

## 2020-05-21 ENCOUNTER — Encounter (HOSPITAL_COMMUNITY): Payer: Self-pay

## 2020-05-21 ENCOUNTER — Other Ambulatory Visit: Payer: Self-pay

## 2020-05-21 ENCOUNTER — Inpatient Hospital Stay (HOSPITAL_COMMUNITY)
Admission: EM | Admit: 2020-05-21 | Discharge: 2020-05-24 | DRG: 189 | Disposition: A | Discharge status: Home Health | Payer: Medicare Other | Attending: Internal Medicine | Admitting: Internal Medicine

## 2020-05-21 ENCOUNTER — Emergency Department (HOSPITAL_COMMUNITY): Payer: Medicare Other

## 2020-05-21 DIAGNOSIS — H5462 Unqualified visual loss, left eye, normal vision right eye: Secondary | ICD-10-CM | POA: Diagnosis present

## 2020-05-21 DIAGNOSIS — E785 Hyperlipidemia, unspecified: Secondary | ICD-10-CM | POA: Diagnosis present

## 2020-05-21 DIAGNOSIS — I130 Hypertensive heart and chronic kidney disease with heart failure and stage 1 through stage 4 chronic kidney disease, or unspecified chronic kidney disease: Secondary | ICD-10-CM | POA: Diagnosis present

## 2020-05-21 DIAGNOSIS — J209 Acute bronchitis, unspecified: Secondary | ICD-10-CM | POA: Diagnosis present

## 2020-05-21 DIAGNOSIS — Z923 Personal history of irradiation: Secondary | ICD-10-CM

## 2020-05-21 DIAGNOSIS — E861 Hypovolemia: Secondary | ICD-10-CM | POA: Diagnosis present

## 2020-05-21 DIAGNOSIS — N183 Chronic kidney disease, stage 3 unspecified: Secondary | ICD-10-CM | POA: Diagnosis present

## 2020-05-21 DIAGNOSIS — Z833 Family history of diabetes mellitus: Secondary | ICD-10-CM

## 2020-05-21 DIAGNOSIS — Z79891 Long term (current) use of opiate analgesic: Secondary | ICD-10-CM

## 2020-05-21 DIAGNOSIS — K219 Gastro-esophageal reflux disease without esophagitis: Secondary | ICD-10-CM | POA: Diagnosis present

## 2020-05-21 DIAGNOSIS — Z85810 Personal history of malignant neoplasm of tongue: Secondary | ICD-10-CM

## 2020-05-21 DIAGNOSIS — Z8673 Personal history of transient ischemic attack (TIA), and cerebral infarction without residual deficits: Secondary | ICD-10-CM

## 2020-05-21 DIAGNOSIS — Z20822 Contact with and (suspected) exposure to covid-19: Secondary | ICD-10-CM | POA: Diagnosis present

## 2020-05-21 DIAGNOSIS — Z7902 Long term (current) use of antithrombotics/antiplatelets: Secondary | ICD-10-CM

## 2020-05-21 DIAGNOSIS — R0602 Shortness of breath: Secondary | ICD-10-CM

## 2020-05-21 DIAGNOSIS — I358 Other nonrheumatic aortic valve disorders: Secondary | ICD-10-CM | POA: Diagnosis present

## 2020-05-21 DIAGNOSIS — F17210 Nicotine dependence, cigarettes, uncomplicated: Secondary | ICD-10-CM | POA: Diagnosis present

## 2020-05-21 DIAGNOSIS — E876 Hypokalemia: Secondary | ICD-10-CM | POA: Diagnosis present

## 2020-05-21 DIAGNOSIS — I25119 Atherosclerotic heart disease of native coronary artery with unspecified angina pectoris: Secondary | ICD-10-CM | POA: Diagnosis present

## 2020-05-21 DIAGNOSIS — E1159 Type 2 diabetes mellitus with other circulatory complications: Secondary | ICD-10-CM | POA: Diagnosis present

## 2020-05-21 DIAGNOSIS — F32A Depression, unspecified: Secondary | ICD-10-CM | POA: Diagnosis present

## 2020-05-21 DIAGNOSIS — Z91048 Other nonmedicinal substance allergy status: Secondary | ICD-10-CM

## 2020-05-21 DIAGNOSIS — I509 Heart failure, unspecified: Secondary | ICD-10-CM

## 2020-05-21 DIAGNOSIS — N1832 Chronic kidney disease, stage 3b: Secondary | ICD-10-CM | POA: Diagnosis present

## 2020-05-21 DIAGNOSIS — Z79899 Other long term (current) drug therapy: Secondary | ICD-10-CM

## 2020-05-21 DIAGNOSIS — E7800 Pure hypercholesterolemia, unspecified: Secondary | ICD-10-CM | POA: Diagnosis present

## 2020-05-21 DIAGNOSIS — J9601 Acute respiratory failure with hypoxia: Secondary | ICD-10-CM | POA: Diagnosis not present

## 2020-05-21 DIAGNOSIS — E1122 Type 2 diabetes mellitus with diabetic chronic kidney disease: Secondary | ICD-10-CM | POA: Diagnosis present

## 2020-05-21 DIAGNOSIS — I5033 Acute on chronic diastolic (congestive) heart failure: Secondary | ICD-10-CM | POA: Diagnosis present

## 2020-05-21 DIAGNOSIS — F419 Anxiety disorder, unspecified: Secondary | ICD-10-CM | POA: Diagnosis present

## 2020-05-21 DIAGNOSIS — Z7982 Long term (current) use of aspirin: Secondary | ICD-10-CM

## 2020-05-21 DIAGNOSIS — H409 Unspecified glaucoma: Secondary | ICD-10-CM | POA: Diagnosis present

## 2020-05-21 DIAGNOSIS — N179 Acute kidney failure, unspecified: Secondary | ICD-10-CM | POA: Diagnosis present

## 2020-05-21 DIAGNOSIS — Z9851 Tubal ligation status: Secondary | ICD-10-CM

## 2020-05-21 DIAGNOSIS — R002 Palpitations: Secondary | ICD-10-CM | POA: Diagnosis present

## 2020-05-21 DIAGNOSIS — Z970 Presence of artificial eye: Secondary | ICD-10-CM

## 2020-05-21 DIAGNOSIS — I5032 Chronic diastolic (congestive) heart failure: Secondary | ICD-10-CM | POA: Diagnosis present

## 2020-05-21 DIAGNOSIS — Z66 Do not resuscitate: Secondary | ICD-10-CM | POA: Diagnosis present

## 2020-05-21 LAB — RESP PANEL BY RT-PCR (FLU A&B, COVID) ARPGX2
Influenza A by PCR: NEGATIVE
Influenza B by PCR: NEGATIVE
SARS Coronavirus 2 by RT PCR: NEGATIVE

## 2020-05-21 LAB — CBC WITH DIFFERENTIAL/PLATELET
Abs Immature Granulocytes: 0.03 10*3/uL (ref 0.00–0.07)
Basophils Absolute: 0.1 10*3/uL (ref 0.0–0.1)
Basophils Relative: 1 %
Eosinophils Absolute: 0.1 10*3/uL (ref 0.0–0.5)
Eosinophils Relative: 2 %
HCT: 35.1 % — ABNORMAL LOW (ref 36.0–46.0)
Hemoglobin: 11.1 g/dL — ABNORMAL LOW (ref 12.0–15.0)
Immature Granulocytes: 1 %
Lymphocytes Relative: 26 %
Lymphs Abs: 1.7 10*3/uL (ref 0.7–4.0)
MCH: 28.9 pg (ref 26.0–34.0)
MCHC: 31.6 g/dL (ref 30.0–36.0)
MCV: 91.4 fL (ref 80.0–100.0)
Monocytes Absolute: 0.5 10*3/uL (ref 0.1–1.0)
Monocytes Relative: 7 %
Neutro Abs: 4.2 10*3/uL (ref 1.7–7.7)
Neutrophils Relative %: 63 %
Platelets: 275 10*3/uL (ref 150–400)
RBC: 3.84 MIL/uL — ABNORMAL LOW (ref 3.87–5.11)
RDW: 17.2 % — ABNORMAL HIGH (ref 11.5–15.5)
WBC: 6.6 10*3/uL (ref 4.0–10.5)
nRBC: 0 % (ref 0.0–0.2)

## 2020-05-21 LAB — BASIC METABOLIC PANEL
Anion gap: 8 (ref 5–15)
BUN: 19 mg/dL (ref 8–23)
CO2: 25 mmol/L (ref 22–32)
Calcium: 8.9 mg/dL (ref 8.9–10.3)
Chloride: 105 mmol/L (ref 98–111)
Creatinine, Ser: 1.81 mg/dL — ABNORMAL HIGH (ref 0.44–1.00)
GFR, Estimated: 27 mL/min — ABNORMAL LOW (ref >60)
Glucose, Bld: 146 mg/dL — ABNORMAL HIGH (ref 70–99)
Potassium: 3.8 mmol/L (ref 3.5–5.1)
Sodium: 138 mmol/L (ref 135–145)

## 2020-05-21 LAB — BRAIN NATRIURETIC PEPTIDE: B Natriuretic Peptide: 908.5 pg/mL — ABNORMAL HIGH (ref 0.0–100.0)

## 2020-05-21 LAB — GLUCOSE, CAPILLARY: Glucose-Capillary: 129 mg/dL — ABNORMAL HIGH (ref 70–99)

## 2020-05-21 LAB — TROPONIN I (HIGH SENSITIVITY)
Troponin I (High Sensitivity): 36 ng/L — ABNORMAL HIGH (ref <18)
Troponin I (High Sensitivity): 34 ng/L — ABNORMAL HIGH (ref <18)

## 2020-05-21 MED ORDER — ACETAMINOPHEN 650 MG RE SUPP: 650.0000 mg | Freq: Four times a day (QID) | RECTAL | Status: DC | PRN

## 2020-05-21 MED ORDER — FUROSEMIDE 10 MG/ML IJ SOLN
40.0000 mg | Freq: Once | INTRAMUSCULAR | Status: AC
  Administered 2020-05-21: 40 mg via INTRAVENOUS
  Filled 2020-05-21: qty 4

## 2020-05-21 MED ORDER — CARVEDILOL 12.5 MG PO TABS
12.5000 mg | ORAL_TABLET | Freq: Two times a day (BID) | ORAL | Status: DC
Start: 2020-05-21 — End: 2020-05-24
  Administered 2020-05-21 – 2020-05-24 (×6): 12.5 mg via ORAL
  Filled 2020-05-21 (×7): qty 1

## 2020-05-21 MED ORDER — AMLODIPINE BESYLATE 10 MG PO TABS
10.0000 mg | ORAL_TABLET | Freq: Every day | ORAL | Status: DC
  Administered 2020-05-21 – 2020-05-24 (×4): 10 mg via ORAL
  Filled 2020-05-21 (×4): qty 1

## 2020-05-21 MED ORDER — GABAPENTIN 300 MG PO CAPS
300.0000 mg | ORAL_CAPSULE | Freq: Two times a day (BID) | ORAL | Status: DC
  Administered 2020-05-21 – 2020-05-23 (×5): 300 mg via ORAL
  Filled 2020-05-21 (×5): qty 1

## 2020-05-21 MED ORDER — ALBUTEROL SULFATE HFA 108 (90 BASE) MCG/ACT IN AERS
2.0000 | INHALATION_SPRAY | Freq: Four times a day (QID) | RESPIRATORY_TRACT | Status: DC | PRN
  Filled 2020-05-21: qty 6.7

## 2020-05-21 MED ORDER — LATANOPROST 0.005 % OP SOLN
1.0000 [drp] | Freq: Every day | OPHTHALMIC | Status: DC
  Administered 2020-05-22 – 2020-05-23 (×3): 1 [drp] via OPHTHALMIC
  Filled 2020-05-21 (×2): qty 2.5

## 2020-05-21 MED ORDER — POTASSIUM CHLORIDE CRYS ER 20 MEQ PO TBCR
40.0000 meq | EXTENDED_RELEASE_TABLET | Freq: Once | ORAL | Status: AC
  Administered 2020-05-21: 40 meq via ORAL
  Filled 2020-05-21: qty 2

## 2020-05-21 MED ORDER — ASPIRIN EC 81 MG PO TBEC
81.0000 mg | DELAYED_RELEASE_TABLET | Freq: Every day | ORAL | Status: DC
  Administered 2020-05-21: 81 mg via ORAL
  Filled 2020-05-21: qty 1

## 2020-05-21 MED ORDER — ISOSORBIDE MONONITRATE ER 60 MG PO TB24
60.0000 mg | ORAL_TABLET | Freq: Two times a day (BID) | ORAL | Status: DC
  Administered 2020-05-21 – 2020-05-22 (×2): 60 mg via ORAL
  Filled 2020-05-21 (×2): qty 1

## 2020-05-21 MED ORDER — HEPARIN SODIUM (PORCINE) 5000 UNIT/ML IJ SOLN
5000.0000 [IU] | Freq: Three times a day (TID) | INTRAMUSCULAR | Status: DC
  Administered 2020-05-21 – 2020-05-24 (×8): 5000 [IU] via SUBCUTANEOUS
  Filled 2020-05-21 (×8): qty 1

## 2020-05-21 MED ORDER — ACETAMINOPHEN 325 MG PO TABS: 650.0000 mg | ORAL_TABLET | Freq: Four times a day (QID) | ORAL | Status: DC | PRN

## 2020-05-21 MED ORDER — ATORVASTATIN CALCIUM 40 MG PO TABS
40.0000 mg | ORAL_TABLET | Freq: Every day | ORAL | Status: DC
  Administered 2020-05-22 – 2020-05-24 (×3): 40 mg via ORAL
  Filled 2020-05-21 (×3): qty 1

## 2020-05-21 NOTE — ED Notes (Signed)
Patient transported to X-ray 

## 2020-05-21 NOTE — Plan of Care (Signed)
Discussed with patient admission questions, pain management and purewick usage with some teach back displayed  Problem: Education: Goal: Knowledge of General Education information will improve Description: Including pain rating scale, medication(s)/side effects and non-pharmacologic comfort measures Outcome: Progressing   Problem: Elimination: Goal: Will not experience complications related to urinary retention Outcome: Progressing   Problem: Pain Managment: Goal: General experience of comfort will improve Outcome: Progressing

## 2020-05-21 NOTE — ED Triage Notes (Signed)
Per [facility] pt coming from home reporting worsening sob, dizziness, and palpitations for two weeks. States having hx of anxiety and not knowing if this could be anxiety. Lung sounds clear, bp 130/80 hr 80, spo2 98% room air, cbg 140. 12 lead sinus rythm

## 2020-05-21 NOTE — ED Provider Notes (Signed)
[facility] EMERGENCY DEPARTMENT Provider Note   CSN: [id number] Arrival date & time: 05/21/20  1620     History Chief Complaint  Patient presents with  . Palpitations  . Shortness of Breath  . Dizziness    Jessicah Croll is a 87 y.o. female.  Presented to ER with concern for palpitations, shortness of breath, lightheadedness.  Extensive past medical history including diastolic heart failure, coronary artery disease, tobacco abuse, depression, anxiety, CKD, hypertension, diabetes, tongue cancer history.  States that she has been having some palpitations, feeling anxious and somewhat short of breath.  Episodes occurring at rest and with exertion.  No alleviating or aggravating factors identified.  Currently symptoms are mild.  No associated chest pain.  No cough or fever.  States this feels similar to her last admission.  In November was admitted for similar symptoms, found to be hypoxic VQ scan negative.Marland Kitchen  HPI     Past Medical History:  Diagnosis Date  . Anemia 04/03/2011  . Anginal pain ([place])    a. 02/2014 MV: Low risk; b. 05/2017 MV: small area of inferior basal ischemia. EF 54% - low risk-->Med mgmt.  Marland Kitchen Anxiety   . Aortic valve sclerosis w/ systolic murmur    a. 62/9476 AoV sclerosis w/o stenosis.  . Arthritis    "feet" (06/02/2017)  . Blind left eye 1999  . CKD (chronic kidney disease) stage 3, GFR 30-59 ml/min (HCC)   . Depression   . Diastolic dysfunction    a. 10/2018 Echo: EF 60-65%, mild septal/posterior LVH. Impaired relaxation. Nl RV size/fxn. Mod dil LA. Trace TR. Triv AI. Mod AoV sclerosis w/o stenosis. Nl PASP.  Marland Kitchen Exertional dyspnea   . GERD (gastroesophageal reflux disease)   . High cholesterol   . Hip fracture ([place]) 07/17/11   fall from 1-2 feet; left  . Hypertension   . Ischemic stroke ([place]) 05/2017   a. 06/2017 Holter: RSR, PACs, no afib.  . Prosthetic eye globe 1999   "from diabetic; left eye"  . Type II diabetes mellitus ([place])    not  taking any medication (06/02/2017)    Patient Active Problem List   Diagnosis Date Noted  . Shortness of breath 05/21/2020  . Acute hypoxemic respiratory failure ([place]) 11/24/2019  . Hypoxia 11/23/2019  . Hemiparesis due to old stroke ([place]) 05/12/2019  . Tachycardia   . Coronary artery disease involving native coronary artery of native heart without angina pectoris   . Unstable angina ([place]) 04/08/2019  . HTN (hypertension) 04/07/2019  . HLD (hyperlipidemia) 04/07/2019  . Elevated troponin 04/07/2019  . AKI (acute kidney injury) ([place]) 11/01/2018  . Leukocytosis 02/22/2018  . Influenza A 02/21/2018  . Wheezy bronchitis 02/21/2018  . Acute bronchitis 02/21/2018  . Hypertension associated with stage 3 chronic kidney disease due to type 2 diabetes mellitus ([place]) 06/09/2017  . Dyslipidemia associated with type 2 diabetes mellitus ([place]) 06/09/2017  . Stage 3 chronic kidney disease due to type 2 diabetes mellitus ([place]) 06/09/2017  . Peripheral sensory neuropathy due to type 2 diabetes mellitus ([place]) 06/09/2017  . GERD without esophagitis 06/09/2017  . Glaucoma due to type 2 diabetes mellitus ([place]) 06/09/2017  . Alcohol abuse 06/04/2017  . Ischemic stroke ([place]) 06/02/2017  . Type 2 diabetes mellitus with vascular disease ([place]) 06/02/2017  . Chest pain 06/02/2017  . Closed non-physeal fracture of metatarsal bone of right foot 07/31/2016  . Hypertensive urgency 02/17/2014  . Chronic diastolic heart failure ([place]) 03/15/2013  . Closed left hip  fracture ([place]) 07/17/2011    Past Surgical History:  Procedure Laterality Date  . ENUCLEATION Left   . FRACTURE SURGERY    . HIP ARTHROPLASTY  07/18/2011   Procedure: ARTHROPLASTY BIPOLAR HIP;  Surgeon: Nita Sells, MD;  Location: [facility];  Service: Orthopedics;  Laterality: Left;  . INTRAOCULAR PROSTHESES INSERTION  1999   left  . LEFT HEART CATH AND CORS/GRAFTS ANGIOGRAPHY N/A 04/08/2019   Procedure: LEFT HEART CATH AND CORS/GRAFTS  ANGIOGRAPHY;  Surgeon: Minna Merritts, MD;  Location: [facility];  Service: Cardiovascular;  Laterality: N/A;  . [place]   "cancer"  . TUBAL LIGATION  1970's     OB History   No obstetric history on file.     Family History  Problem Relation Age of Onset  . Diabetes type II Mother   . Diabetes type II Father   . Diabetes type II Sister   . Colon cancer Neg Hx     Social History   Tobacco Use  . Smoking status: Former Smoker    Packs/day: 1.50    Years: 47.00    Pack years: 70.50    Types: Cigarettes    Quit date: 09/13/1997    Years since quitting: 22.7  . Smokeless tobacco: Never Used  Vaping Use  . Vaping Use: Never used  Substance Use Topics  . Alcohol use: Not Currently    Alcohol/week: 17.0 standard drinks    Types: 17 Shots of liquor per week    Comment: Quit 03/2018  . Drug use: No    Types: Marijuana, Heroin    Comment: 06/02/2017  "last drug use was in the 1980s." Prev injected heroin.    Home Medications Prior to Admission medications   Medication Sig Start Date End Date Taking? Authorizing Provider  acetaminophen (TYLENOL) 325 MG tablet Take 2 tablets (650 mg total) by mouth every 6 (six) hours as needed for mild pain or fever (or temp > 37.5 C (99.5 F)). 06/05/17  Yes Elgergawy, Silver Huguenin, MD  albuterol (VENTOLIN HFA) 108 (90 Base) MCG/ACT inhaler Inhale 2 puffs into the lungs every 6 (six) hours as needed for wheezing or shortness of breath.   Yes [provider]  amLODipine (NORVASC) 10 MG tablet Take 1 tablet (10 mg total) by mouth daily. 06/09/19 11/24/19 Yes Pahwani, Einar Grad, MD  aspirin EC 81 MG tablet Take 81 mg by mouth at bedtime.   Yes [provider]  atorvastatin (LIPITOR) 40 MG tablet Take 1 tablet (40 mg total) by mouth daily at 6 PM. Patient taking differently: Take 40 mg by mouth at bedtime. 06/08/19  Yes Darliss Cheney, MD  carvedilol (COREG) 12.5 MG tablet Take 1 tablet (12.5 mg total) by mouth 2 (two)  times daily with a meal. 06/08/19 11/24/19 Yes Pahwani, Einar Grad, MD  cetirizine (ZYRTEC) 10 MG tablet Take 10 mg by mouth daily as needed for allergies or rhinitis.   Yes [provider]  clopidogrel (PLAVIX) 75 MG tablet Take 75 mg by mouth daily.   Yes [provider]  Cobalamin Combinations (NEURIVA PLUS) CAPS Take 1 capsule by mouth daily with breakfast.   Yes [provider]  fluticasone (FLONASE) 50 MCG/ACT nasal spray Place 1-2 sprays into both nostrils daily as needed for allergies or rhinitis.   Yes [provider]  furosemide (LASIX) 40 MG tablet Take 1 tablet (40 mg total) by mouth daily. 06/08/19 11/24/19 Yes Darliss Cheney, MD  gabapentin (NEURONTIN) 300 MG  capsule Take 300 mg by mouth 3 (three) times daily.    Yes [provider]  guaiFENesin (MUCINEX) 600 MG 12 hr tablet Take 1 tablet (600 mg total) by mouth 2 (two) times daily. Patient taking differently: Take 600 mg by mouth 2 (two) times daily as needed for to loosen phlegm. 11/27/19  Yes Oswald Hillock, MD  hydrALAZINE (APRESOLINE) 10 MG tablet TAKE 1 TABLET(10 MG) BY MOUTH THREE TIMES DAILY Patient taking differently: Take 10 mg by mouth 3 (three) times daily. 08/01/19  Yes Belva Crome, MD  isosorbide mononitrate (IMDUR) 60 MG 24 hr tablet Take 1 tablet (60 mg total) by mouth 2 (two) times daily. 04/24/20  Yes Belva Crome, MD  latanoprost (XALATAN) 0.005 % ophthalmic solution Place 1 drop into the right eye at bedtime. 05/07/20  Yes [provider]  Multiple Vitamins-Minerals (ONE-A-DAY WOMENS 50+) TABS Take 1 tablet by mouth daily with breakfast.   Yes [provider]  neomycin-polymyxin-hydrocortisone (CORTISPORIN) 3.5-10000-1 OTIC suspension Place 2-3 drops into the right ear at bedtime.   Yes [provider]  nitroGLYCERIN (NITROSTAT) 0.4 MG SL tablet Place 1 tablet (0.4 mg total) under the tongue every 5 (five) minutes as needed for chest pain. 02/11/19  11/24/19 Yes Isaiah Serge, NP  omeprazole (PRILOSEC) 20 MG capsule TAKE 1 CAPSULE(20 MG) BY MOUTH DAILY Patient taking differently: Take 20 mg by mouth daily before breakfast. 12/15/19  Yes Isaiah Serge, NP  ALPHAGAN P 0.1 % SOLN Place 1 drop into the right eye 2 (two) times daily. Patient not taking: No sig reported 10/10/15   [provider]  folic acid (FOLVITE) 1 MG tablet Take 1 tablet (1 mg total) by mouth daily. Patient not taking: Reported on 05/21/2020 06/06/17   Elgergawy, Silver Huguenin, MD  Multiple Vitamin (MULTIVITAMIN WITH MINERALS) TABS tablet Take 1 tablet by mouth daily. Patient not taking: Reported on 05/21/2020 06/06/17   Elgergawy, Silver Huguenin, MD  TRAVATAN Z 0.004 % SOLN ophthalmic solution Place 1 drop into the right eye at bedtime.  Patient not taking: Reported on 05/21/2020 11/24/12   [provider]    Allergies    Tape  Review of Systems   Review of Systems  Constitutional: Negative for chills and fever.  HENT: Negative for ear pain and sore throat.   Eyes: Negative for pain and visual disturbance.  Respiratory: Positive for shortness of breath. Negative for cough.   Cardiovascular: Positive for palpitations. Negative for chest pain.  Gastrointestinal: Negative for abdominal pain and vomiting.  Genitourinary: Negative for dysuria and hematuria.  Musculoskeletal: Negative for arthralgias and back pain.  Skin: Negative for color change and rash.  Neurological: Negative for seizures and syncope.  All other systems reviewed and are negative.   Physical Exam Updated Vital Signs BP 99/64 (BP Location: Left Arm)   Pulse 65   Temp 97.8 F (36.6 C) (Oral)   Resp 19   Ht 5' (1.524 m)   Wt 57.5 kg   SpO2 97%   BMI 24.76 kg/m   Physical Exam Vitals and nursing note reviewed.  Constitutional:      General: She is not in acute distress.    Appearance: She is well-developed.  HENT:     Head: Normocephalic and atraumatic.  Eyes:      Conjunctiva/sclera: Conjunctivae normal.  Cardiovascular:     Rate and Rhythm: Normal rate and regular rhythm.     Heart sounds: No murmur heard.   Pulmonary:  Effort: Pulmonary effort is normal. No respiratory distress.     Breath sounds: Normal breath sounds.  Abdominal:     Palpations: Abdomen is soft.     Tenderness: There is no abdominal tenderness.  Musculoskeletal:     Cervical back: Neck supple.  Skin:    General: Skin is warm and dry.  Neurological:     Mental Status: She is alert.  Psychiatric:        Mood and Affect: Mood normal.     ED Results / Procedures / Treatments   Labs (all labs ordered are listed, but only abnormal results are displayed) Labs Reviewed  CBC WITH DIFFERENTIAL/PLATELET - Abnormal; Notable for the following components:      Result Value   RBC 3.84 (*)    Hemoglobin 11.1 (*)    HCT 35.1 (*)    RDW 17.2 (*)    All other components within normal limits  BASIC METABOLIC PANEL - Abnormal; Notable for the following components:   Glucose, Bld 146 (*)    Creatinine, Ser 1.81 (*)    GFR, Estimated 27 (*)    All other components within normal limits  BRAIN NATRIURETIC PEPTIDE - Abnormal; Notable for the following components:   B Natriuretic Peptide 908.5 (*)    All other components within normal limits  COMPREHENSIVE METABOLIC PANEL - Abnormal; Notable for the following components:   Potassium 3.4 (*)    Glucose, Bld 110 (*)    Creatinine, Ser 1.87 (*)    Albumin 2.9 (*)    GFR, Estimated 26 (*)    All other components within normal limits  MAGNESIUM - Abnormal; Notable for the following components:   Magnesium 1.6 (*)    All other components within normal limits  CBC - Abnormal; Notable for the following components:   RBC 3.66 (*)    Hemoglobin 10.6 (*)    HCT 32.7 (*)    RDW 16.9 (*)    All other components within normal limits  GLUCOSE, CAPILLARY - Abnormal; Notable for the following components:   Glucose-Capillary 129 (*)     All other components within normal limits  GLUCOSE, CAPILLARY - Abnormal; Notable for the following components:   Glucose-Capillary 130 (*)    All other components within normal limits  TROPONIN I (HIGH SENSITIVITY) - Abnormal; Notable for the following components:   Troponin I (High Sensitivity) 36 (*)    All other components within normal limits  TROPONIN I (HIGH SENSITIVITY) - Abnormal; Notable for the following components:   Troponin I (High Sensitivity) 34 (*)    All other components within normal limits  RESP PANEL BY RT-PCR (FLU A&B, COVID) ARPGX2  MRSA PCR SCREENING  HEMOGLOBIN A1C  TSH  GLUCOSE, CAPILLARY  RENAL FUNCTION PANEL  CBC  MAGNESIUM    EKG EKG Interpretation  Date/Time:  Monday May 21 2020 16:31:26 EDT Ventricular Rate:  78 PR Interval:  168 QRS Duration: 95 QT Interval:  421 QTC Calculation: 480 R Axis:   -15 Text Interpretation: Sinus rhythm Atrial premature complexes in couplets LVH with secondary repolarization abnormality Confirmed by Addison Lank [phone]) on 05/22/2020 1:17:56 PM   Radiology DG Chest Portable 1 View  Result Date: 05/21/2020 CLINICAL DATA:  Hypoxia and cough. EXAM: PORTABLE CHEST 1 VIEW COMPARISON:  Radiograph and CT 11/23/2019 FINDINGS: Patient is rotated. Borderline cardiomegaly. Similar aortic atherosclerosis and tortuosity allowing for differences in technique. No acute airspace disease, pleural effusion, or pneumothorax. No pulmonary edema. Bones are under mineralized. No  acute osseous abnormalities are seen. IMPRESSION: 1. No acute abnormality. 2. Borderline cardiomegaly.  Aortic Atherosclerosis (ICD10-I70.0). Electronically Signed   By: Keith Rake M.D.   On: 05/21/2020 17:05   ECHOCARDIOGRAM COMPLETE  Result Date: 05/22/2020    ECHOCARDIOGRAM REPORT   Patient Name:   ABISOLA CARRERO Date of Exam: 05/22/2020 Medical Rec #:  [id number]        Height:       60.0 in Accession #:    [id number]       Weight:       126.8 lb Date of  Birth:  1932-12-22        BSA:          1.538 m Patient Age:    5 years         BP:           172/75 mmHg Patient Gender: F                HR:           59 bpm. Exam Location:  Inpatient Procedure: 2D Echo, Cardiac Doppler and Color Doppler Indications:    Chest pain  History:        Patient has prior history of Echocardiogram examinations, most                 recent 11/24/2019. CHF, CAD, Signs/Symptoms:Shortness of Breath;                 Risk Factors:Dyslipidemia, Hypertension and Current Smoker.                 Chronic angina. CKD. Hx CVA.  Sonographer:    Clayton Lefort RDCS (AE) Referring Phys: [facility]  1. Left ventricular ejection fraction, by estimation, is 50 to 55%. Left ventricular ejection fraction by 3D volume is 49 %. The left ventricle has low normal function. The left ventricle demonstrates regional wall motion abnormalities (see scoring diagram/findings for description). Left ventricular diastolic parameters are consistent with Grade I diastolic dysfunction (impaired relaxation). Elevated left atrial pressure. There is mild hypokinesis of the left ventricular, basal inferoseptal wall, inferior wall and inferolateral wall.  2. Right ventricular systolic function is normal. The right ventricular size is normal. There is normal pulmonary artery systolic pressure.  3. Left atrial size was mildly dilated.  4. The mitral valve is normal in structure. Trivial mitral valve regurgitation. No evidence of mitral stenosis. Moderate mitral annular calcification.  5. The aortic valve is tricuspid. There is mild calcification of the aortic valve. There is moderate thickening of the aortic valve. Aortic valve regurgitation is mild. Mild aortic valve stenosis.  6. The inferior vena cava is dilated in size with <50% respiratory variability, suggesting right atrial pressure of 15 mmHg. Comparison(s): No significant change from prior study. Prior images reviewed side by side. FINDINGS  Left  Ventricle: Left ventricular ejection fraction, by estimation, is 50 to 55%. Left ventricular ejection fraction by 3D volume is 49 %. The left ventricle has low normal function. The left ventricle demonstrates regional wall motion abnormalities. Mild hypokinesis of the left ventricular, basal inferoseptal wall, inferior wall and inferolateral wall. The left ventricular internal cavity size was normal in size. There is no left ventricular hypertrophy. Left ventricular diastolic parameters are consistent with Grade I diastolic dysfunction (impaired relaxation). Elevated left atrial pressure. Right Ventricle: The right ventricular size is normal. No increase in right ventricular wall thickness. Right ventricular systolic function is normal. There  is normal pulmonary artery systolic pressure. The tricuspid regurgitant velocity is 2.16 m/s, and  with an assumed right atrial pressure of 8 mmHg, the estimated right ventricular systolic pressure is 67.1 mmHg. Left Atrium: Left atrial size was mildly dilated. Right Atrium: Right atrial size was normal in size. Pericardium: There is no evidence of pericardial effusion. Mitral Valve: The mitral valve is normal in structure. Moderate mitral annular calcification. Trivial mitral valve regurgitation. No evidence of mitral valve stenosis. MV peak gradient, 6.9 mmHg. The mean mitral valve gradient is 1.0 mmHg. Tricuspid Valve: The tricuspid valve is normal in structure. Tricuspid valve regurgitation is mild . No evidence of tricuspid stenosis. Aortic Valve: The aortic valve is tricuspid. There is mild calcification of the aortic valve. There is moderate thickening of the aortic valve. Aortic valve regurgitation is mild. Aortic regurgitation PHT measures 726 msec. Mild aortic stenosis is present. Aortic valve mean gradient measures 7.0 mmHg. Aortic valve peak gradient measures 15.4 mmHg. Aortic valve area, by VTI measures 1.68 cm. Pulmonic Valve: The pulmonic valve was normal in  structure. Pulmonic valve regurgitation is not visualized. No evidence of pulmonic stenosis. Aorta: The aortic root is normal in size and structure. Venous: The inferior vena cava is dilated in size with less than 50% respiratory variability, suggesting right atrial pressure of 15 mmHg. IAS/Shunts: No atrial level shunt detected by color flow Doppler.  LEFT VENTRICLE PLAX 2D LVIDd:         4.30 cm         Diastology LVIDs:         2.80 cm         LV e' medial:    1.99 cm/s LV PW:         1.00 cm         LV E/e' medial:  25.8 LV IVS:        1.10 cm         LV e' lateral:   2.82 cm/s LVOT diam:     1.90 cm         LV E/e' lateral: 18.2 LV SV:         66 LV SV Index:   43 LVOT Area:     2.84 cm        3D Volume EF                                LV 3D EF:    Left                                             ventricular                                             ejection                                             fraction by  3D volume                                             is 49 %.                                 3D Volume EF:                                3D EF:        49 %                                LV EDV:       138 ml                                LV ESV:       70 ml                                LV SV:        68 ml RIGHT VENTRICLE             IVC RV Basal diam:  2.40 cm     IVC diam: 1.90 cm RV S prime:     13.10 cm/s TAPSE (M-mode): 2.1 cm LEFT ATRIUM             Index       RIGHT ATRIUM           Index LA diam:        4.10 cm 2.67 cm/m  RA Area:     13.40 cm LA Vol (A2C):   93.4 ml 60.74 ml/m RA Volume:   26.40 ml  17.17 ml/m LA Vol (A4C):   49.9 ml 32.45 ml/m LA Biplane Vol: 69.8 ml 45.39 ml/m  AORTIC VALVE AV Area (Vmax):    1.23 cm AV Area (Vmean):   1.31 cm AV Area (VTI):     1.68 cm AV Vmax:           196.00 cm/s AV Vmean:          126.000 cm/s AV VTI:            0.393 m AV Peak Grad:      15.4 mmHg AV Mean Grad:      7.0 mmHg LVOT Vmax:          84.80 cm/s LVOT Vmean:        58.400 cm/s LVOT VTI:          0.233 m LVOT/AV VTI ratio: 0.59 AI PHT:            726 msec  AORTA Ao Root diam: 3.20 cm MITRAL VALVE                TRICUSPID VALVE MV Area (PHT): 2.36 cm     TR Peak grad:   18.7 mmHg MV Area VTI:   1.83 cm     TR Vmax:        216.00 cm/s MV Peak grad:  6.9 mmHg MV Mean grad:  1.0 mmHg     SHUNTS MV Vmax:       1.31 m/s     Systemic VTI:  0.23 m MV Vmean:      51.3 cm/s    Systemic Diam: 1.90 cm MV Decel Time: 322 msec MV E velocity: 51.40 cm/s MV A velocity: 131.00 cm/s MV E/A ratio:  0.39 Mihai Croitoru MD Electronically signed by Sanda Klein MD Signature Date/Time: 05/22/2020/5:03:19 PM    Final     Procedures .Critical Care Performed by: Lucrezia Starch, MD Authorized by: Lucrezia Starch, MD   Critical care provider statement:    Critical care time (minutes):  41   Critical care was time spent personally by me on the following activities:  Discussions with consultants, evaluation of patient's response to treatment, examination of patient, ordering and performing treatments and interventions, ordering and review of laboratory studies, ordering and review of radiographic studies, pulse oximetry, re-evaluation of patient's condition, obtaining history from patient or surrogate and review of old charts     Medications Ordered in ED Medications  heparin injection 5,000 Units (5,000 Units Subcutaneous Given 05/22/20 1456)  acetaminophen (TYLENOL) tablet 650 mg (has no administration in time range)    Or  acetaminophen (TYLENOL) suppository 650 mg (has no administration in time range)  atorvastatin (LIPITOR) tablet 40 mg (has no administration in time range)  carvedilol (COREG) tablet 12.5 mg (12.5 mg Oral Given 05/22/20 0925)  amLODipine (NORVASC) tablet 10 mg (10 mg Oral Given 05/22/20 0925)  gabapentin (NEURONTIN) capsule 300 mg (300 mg Oral Given 05/22/20 0926)  albuterol (VENTOLIN HFA) 108 (90 Base) MCG/ACT inhaler 2  puff (has no administration in time range)  latanoprost (XALATAN) 0.005 % ophthalmic solution 1 drop (1 drop Right Eye Given 05/22/20 0457)  clopidogrel (PLAVIX) tablet 75 mg (75 mg Oral Given 05/22/20 0925)  isosorbide mononitrate (IMDUR) 24 hr tablet 60 mg (has no administration in time range)  furosemide (LASIX) injection 40 mg (40 mg Intravenous Given 05/21/20 1944)  potassium chloride SA (KLOR-CON) CR tablet 40 mEq (40 mEq Oral Given 05/21/20 2228)  potassium chloride SA (KLOR-CON) CR tablet 20 mEq (20 mEq Oral Given 05/22/20 0458)  magnesium sulfate IVPB 2 g 50 mL (2 g Intravenous New Bag/Given 05/22/20 0501)    ED Course  I have reviewed the triage vital signs and the nursing notes.  Pertinent labs & imaging results that were available during my care of the patient were reviewed by me and considered in my medical decision making (see chart for details).    MDM Rules/Calculators/A&P                         87 year old lady presenting to ER with concern for palpitations, feeling more short of breath than normal.  On exam patient was well-appearing in no distress.  Her vitals are stable except she was noted to have hypoxia to mid to upper 80s on room air.  EKG did not have acute ischemic change and her troponin not significantly elevated, lower suspicion for ACS.  No chest pain, no tachycardia, similar symptoms previously with negative VQ scan, lower suspicion for acute PE at present.  Due to her renal function, would need VQ scan to fully rule out this differential, will defer to admitting team.  Her BNP was elevated raising concern for possibility of fluid overload state.  We will give trial of Lasix.  Placed on supplemental nasal cannula.  Consulted on side medicine  for admission.   . Final Clinical Impression(s) / ED Diagnoses Final diagnoses:  Shortness of breath  Acute respiratory failure with hypoxia [facility])    Rx / DC Orders ED Discharge Orders    None       Lucrezia Starch,  MD 05/22/20 1758

## 2020-05-21 NOTE — H&P (Signed)
Date: 05/21/2020               Patient Name:  [name] MRN: [id number]  DOB: 1932/08/30 Age / Sex: 87 y.o., female   PCP: Dierdre Harness, FNP         Medical Service: Internal Medicine Teaching Service         Attending Physician: Dr. Lucious Groves, DO    First Contact: Dr. Armando Reichert, MD Pager: [phone]  Second Contact: Dr. Maudie Mercury, MD Pager: [phone]       After Hours (After 5p/  First Contact Pager: [phone]  weekends / holidays): Second Contact Pager: [phone]   Chief Complaint: Shortness of breath  History of Present Illness: [name] is an 87 year old female with past medical history significant for chronic diastolic heart failure (EF 55-60% 04/12/2019), severe one-vessel CAD with chronic angina, ongoing tobacco use, anxiety, depression, CKD3b, HTN, diet-controlled T2DM, prior CVA, tongue cancer in remission status post radiation, and HLD who presented for evaluation of shortness of breath  Patient states that for the past several months, she has been having trouble breathing. She states that she gets winded when trying to carry out a long conversation or when she is doing chores around her house. Her symptoms have been particularly bothersome over the past two days. She uses her albuterol inhaler twice daily and has not needed to increase frequency. She sleeps lying flat in bed but uses two pillows under her head. Additionally, she endorses some tightness of her left lower chest tightness with performing chores around her house which has been ongoing for "a very long time." Otherwise, she denies current chest pain, fevers, chills, abdominal pain, nausea, vomiting, diarrhea, swelling, or leg pain.  Additionally, patient reports that she has been feeling very depressed recently and has been "crying a lot." She lives by herself and spends her days alone. She has five children and also grandchildren, however she will only see them occassionally on a weekend.  She handles all of her activities of daily living at home and medications. She states that she is taking her medications as prescribed. She is uncomfortable living alone at home and is hopeful that she may be able to "go to a nursing home."   ED Course: On arrival to the ED, patient saturating at 88% on room air, otherwise hemodynamically stable. EKG with sinus rhythm, atrial premature complexes in couplets, and LVH. Labs with CBC revealing hemoglobin of 11.1, MCV 91.4; BMP with glucose 146, creatinine 1.81, BUN 19; troponin 36 repeat in process; BNP of 908.5; respiratory panel negative. CXR with no acute abnormality, however borderline cardiomegaly. Patient received 40mg  IV lasix and IMTS was consulted for admission.  Medications: Acetaminophen 650mg  every six hours as needed Alphagan 0.1% solution, 1 drop in right eye twice daily Amlodipine 10mg  daily Aspirin 81mg  daily Atorvastatin 40mg  daily Carvedilol 12.5mg  twice daily Clopidogrel 75mg  daily Folic acid 1mg  daily Furosemide 40mg  daily Gabapentin 300mg  three times daily Guaifenesin 600mg  twice daily Hydralazine 10mg  three times daily Isosorbide mononitrate 60mg  twice daily Multivitamin daily Nitroglycerin 0.4mg  sublingual tablet every five minutes as needed Omeprazole 20mg  daily Travatan Z 0.004% solution, 1 drop in right eye nightly  Allergies: Allergies as of 05/21/2020 - Review Complete 05/21/2020  Allergen Reaction Noted  . Tape Other (See Comments) 05/21/2020   Past Medical History:  Diagnosis Date  . Anemia 04/03/2011  . Anginal pain ([place])    a. 02/2014 MV: Low risk; b. 05/2017 MV: small area  of inferior basal ischemia. EF 54% - low risk-->Med mgmt.  Marland Kitchen Anxiety   . Aortic valve sclerosis w/ systolic murmur    a. 37/8588 AoV sclerosis w/o stenosis.  . Arthritis    "feet" (06/02/2017)  . Blind left eye 1999  . CKD (chronic kidney disease) stage 3, GFR 30-59 ml/min (HCC)   . Depression   . Diastolic dysfunction    a.  10/2018 Echo: EF 60-65%, mild septal/posterior LVH. Impaired relaxation. Nl RV size/fxn. Mod dil LA. Trace TR. Triv AI. Mod AoV sclerosis w/o stenosis. Nl PASP.  Marland Kitchen Exertional dyspnea   . GERD (gastroesophageal reflux disease)   . High cholesterol   . Hip fracture ([place]) 07/17/11   fall from 1-2 feet; left  . Hypertension   . Ischemic stroke ([place]) 05/2017   a. 06/2017 Holter: RSR, PACs, no afib.  . Prosthetic eye globe 1999   "from diabetic; left eye"  . Type II diabetes mellitus (HCC)    not taking any medication (06/02/2017)   Family History:  Family History  Problem Relation Age of Onset  . Diabetes type II Mother   . Diabetes type II Father   . Diabetes type II Sister   . Colon cancer Neg Hx    Social History:  Patient had eight children, however three are deceased. She is accompanied by one of her daughters today. Her granddaughter, [name], helps her attend her medical appointments. She lives in a one-story house by herself and ambulates with the use of a cane. She carries out her activities of daily living independently without assistance from home health services. Approximately two months ago, she resumed tobacco use and is currently smoking five cigarettes daily. On most Friday evenings, she drinks approximately a half of a fifth of rum and 5-6 beers. She does not consume alcohol throughout the week.  Review of Systems: A complete ROS was negative except as per HPI.  Physical Exam: Blood pressure 118/72, pulse 64, temperature (!) 97.5 F (36.4 C), temperature source Oral, resp. rate 20, height 5' (1.524 m), weight 55.7 kg, SpO2 91 %. Physical Exam Vitals and nursing note reviewed.  Constitutional:      General: She is not in acute distress.    Appearance: She is normal weight. She is not ill-appearing.  HENT:     Head: Normocephalic and atraumatic.  Neck:     Comments: No JVD Cardiovascular:     Rate and Rhythm: Normal rate.     Comments: Left upper sternal border 2/6  systolic murmur Pulmonary:     Effort: Pulmonary effort is normal. No respiratory distress.     Breath sounds: No decreased breath sounds.     Comments: Crackles diffusely throughout mid to lower lung fields Abdominal:     General: Bowel sounds are normal.     Palpations: Abdomen is soft.     Tenderness: There is no abdominal tenderness.  Musculoskeletal:        General: Normal range of motion.     Comments: No edema of bilateral lower extremities  Skin:    Comments: Decreased skin turgor  Neurological:     General: No focal deficit present.     Mental Status: She is alert and oriented to person, place, and time.  Psychiatric:        Mood and Affect: Mood normal.        Behavior: Behavior normal.   EKG: personally reviewed my interpretation is sinus rhythm with atrial premature complexes  CXR: personally  reviewed my interpretation is no acute cardiopulmonary process.  Assessment & Plan by Problem: Active Problems:   Shortness of breath  Royal Beirne is an 86 year old female with past medical history significant for chronic diastolic heart failure (EF 55-60% 04/12/2019), severe one-vessel CAD with chronic angina, ongoing tobacco use, anxiety, depression, CKD3b, HTN, diet-controlled T2DM, prior CVA, tongue cancer in remission status post radiation, and HLD who presented for evaluation of shortness of breath found to have acute hypoxic respiratory failure.  #Shortness of breath #Chronic diastolic heart failure Patient presenting with chronic shortness of breath with acute worsening over last two days. Her acute worsening of symptoms is likely multifactorial in the setting of her recent resumption of tobacco use, atelectasis and worsening anxiety pertaining to her housing situation. Although patient was saturating at 88% on room air upon arrival to the ED, she is saturating well on room air upon our evaluation following receiving 40mg  IV furosemide. Patient does not appear  hypervolemic on examination and may be slightly volume depleted, although her BNP was elevated to 908.5 on admission. Respiratory examination concerning for atelectasis. No concern for pulmonary embolism at this time. Will continue to monitor patient's symptoms overnight and obtain repeat echocardiogram. -Status post 40mg  IV furosemide  -Will hold further diuresis including home furosemide 40mg  daily -Echocardiogram ordered -Incentive spirometry -Albuterol 1107mcg 2 puffs every six hours as needed -Daily weights -Strict intake and output -SpO2 monitoring, supplemental oxygen as needed  #Anxiety/Depression, chronic #Housing Patient reports that she has been under significant stress and "crying a lot" due to her housing situation. She denies suicidal ideation. Although patient reports independence with activities of daily living, she is an 87 year old woman with a complex medical history living alone with infrequent visitation from family. Both her and her family are hopeful that she can receive increased support outside of the hospital. Patient would benefit from evaluation from physical therapy, occupational therapy and our clinical social workers. -PT eval and treat -OT eval and treat  #Severe one-vessel CAD with chronic angina Patient has history of chronic chest tightness with exertion secondary to severe one-vessel CAD. Her symptoms are stable and not acutely worsened prior to admission. Troponin is mildly elevated at 36, however no new ischemic changes on EKG. -Follow-up repeat troponin -EKG as needed for chest pain -Cardiac monitoring -Continue home aspirin 81mg  daily -Continue home carvedilol 12.5mg  twice daily -Continue home atorvastatin 40mg  nightly -Continue home isosorbide mononitrate 60mg  twice daily  #Chronic kidney disease stage 3b Patient's creatinine elevated to 1.8 upon admission although her baseline renal function appears to be approximately 1.3-1.8. She does appear  mildly volume depleted on examination. -Hold further diuresis -Monitor renal function on repeat metabolic panel in morning -Obtain magnesium in morning  #Hypertension, chronic Patient's blood pressure well controlled upon admission. Patient unable to report any of her home antihypertensive medications, however she reports that she is adherent. -Restart home amlodipine 10mg  daily -Restart carvedilol 12.5mg  twice daily -Restart home isosorbide mononitrate 60mg  twice daily -Hold home hydralazine 10mg  three times daily -Monitor vitals and adjust regimen accordingly  #Hyperlipidemia, chronic -Continue home atorvastatin 40mg  nightly  #Diet-controlled type 2 diabetes mellitus, chronic Patient's blood glucose mildly elevated to 140s upon arrival. She is not currently prescribed a medication for this condition and manages with her diet alone. -Obtain hemoglobin A1c in morning -Monitor blood glucose on metabolic panel  #Binge drinking Discussed with patient the importance of refraining from further binge drinking as she consumes a half a fifth of rum and several beers  on most Friday evenings. -Alcohol cessation counseling  #Tobacco use disorder, chronic Patient quit smoking tobacco, however she recently resumed approximately two months ago. -Tobacco cessation counseling  #Code status: DNR #Diet: Heart healthy #IVF: None at this time #VTE ppx: Heparin 5000 units three times daily #Bowel regimen: None #PT/OT recommendations: Pending  Dispo: Admit patient to Observation with expected length of stay less than 2 midnights.  Signed: Cato Mulligan, MD 05/21/2020, 9:59 PM  Pager: [phone] After 5pm on weekdays and 1pm on weekends: On Call pager: [phone]

## 2020-05-22 ENCOUNTER — Observation Stay (HOSPITAL_COMMUNITY): Payer: Medicare Other

## 2020-05-22 DIAGNOSIS — E1122 Type 2 diabetes mellitus with diabetic chronic kidney disease: Secondary | ICD-10-CM | POA: Diagnosis not present

## 2020-05-22 DIAGNOSIS — I5032 Chronic diastolic (congestive) heart failure: Secondary | ICD-10-CM | POA: Diagnosis not present

## 2020-05-22 DIAGNOSIS — N183 Chronic kidney disease, stage 3 unspecified: Secondary | ICD-10-CM | POA: Diagnosis not present

## 2020-05-22 DIAGNOSIS — J209 Acute bronchitis, unspecified: Secondary | ICD-10-CM

## 2020-05-22 DIAGNOSIS — R079 Chest pain, unspecified: Secondary | ICD-10-CM | POA: Diagnosis not present

## 2020-05-22 LAB — COMPREHENSIVE METABOLIC PANEL
ALT: 25 U/L (ref 0–44)
AST: 38 U/L (ref 15–41)
Albumin: 2.9 g/dL — ABNORMAL LOW (ref 3.5–5.0)
Alkaline Phosphatase: 48 U/L (ref 38–126)
Anion gap: 9 (ref 5–15)
BUN: 19 mg/dL (ref 8–23)
CO2: 25 mmol/L (ref 22–32)
Calcium: 9 mg/dL (ref 8.9–10.3)
Chloride: 104 mmol/L (ref 98–111)
Creatinine, Ser: 1.87 mg/dL — ABNORMAL HIGH (ref 0.44–1.00)
GFR, Estimated: 26 mL/min — ABNORMAL LOW (ref >60)
Glucose, Bld: 110 mg/dL — ABNORMAL HIGH (ref 70–99)
Potassium: 3.4 mmol/L — ABNORMAL LOW (ref 3.5–5.1)
Sodium: 138 mmol/L (ref 135–145)
Total Bilirubin: 0.4 mg/dL (ref 0.3–1.2)
Total Protein: 6.5 g/dL (ref 6.5–8.1)

## 2020-05-22 LAB — CBC
HCT: 32.7 % — ABNORMAL LOW (ref 36.0–46.0)
Hemoglobin: 10.6 g/dL — ABNORMAL LOW (ref 12.0–15.0)
MCH: 29 pg (ref 26.0–34.0)
MCHC: 32.4 g/dL (ref 30.0–36.0)
MCV: 89.3 fL (ref 80.0–100.0)
Platelets: 251 10*3/uL (ref 150–400)
RBC: 3.66 MIL/uL — ABNORMAL LOW (ref 3.87–5.11)
RDW: 16.9 % — ABNORMAL HIGH (ref 11.5–15.5)
WBC: 5.9 10*3/uL (ref 4.0–10.5)
nRBC: 0 % (ref 0.0–0.2)

## 2020-05-22 LAB — ECHOCARDIOGRAM COMPLETE
AR max vel: 1.23 cm2
AV Area VTI: 1.68 cm2
AV Area mean vel: 1.31 cm2
AV Mean grad: 7 mmHg
AV Peak grad: 15.4 mmHg
Ao pk vel: 1.96 m/s
Area-P 1/2: 2.36 cm2
Height: 60 in
MV VTI: 1.83 cm2
P 1/2 time: 726 msec
S' Lateral: 2.8 cm
Weight: 2028.23 oz

## 2020-05-22 LAB — GLUCOSE, CAPILLARY
Glucose-Capillary: 74 mg/dL (ref 70–99)
Glucose-Capillary: 130 mg/dL — ABNORMAL HIGH (ref 70–99)

## 2020-05-22 LAB — MRSA PCR SCREENING: MRSA by PCR: NEGATIVE

## 2020-05-22 LAB — TSH: TSH: 1.47 u[IU]/mL (ref 0.350–4.500)

## 2020-05-22 LAB — MAGNESIUM: Magnesium: 1.6 mg/dL — ABNORMAL LOW (ref 1.7–2.4)

## 2020-05-22 LAB — HEMOGLOBIN A1C
Hgb A1c MFr Bld: 5.5 % (ref 4.8–5.6)
Mean Plasma Glucose: 111.15 mg/dL

## 2020-05-22 MED ORDER — ISOSORBIDE MONONITRATE ER 60 MG PO TB24
60.0000 mg | ORAL_TABLET | Freq: Every day | ORAL | Status: DC
  Administered 2020-05-23 – 2020-05-24 (×2): 60 mg via ORAL
  Filled 2020-05-22 (×2): qty 1

## 2020-05-22 MED ORDER — POTASSIUM CHLORIDE CRYS ER 20 MEQ PO TBCR
20.0000 meq | EXTENDED_RELEASE_TABLET | Freq: Once | ORAL | Status: AC
  Administered 2020-05-22: 20 meq via ORAL
  Filled 2020-05-22: qty 1

## 2020-05-22 MED ORDER — CLOPIDOGREL BISULFATE 75 MG PO TABS
75.0000 mg | ORAL_TABLET | Freq: Every day | ORAL | Status: DC
  Administered 2020-05-22 – 2020-05-24 (×3): 75 mg via ORAL
  Filled 2020-05-22 (×3): qty 1

## 2020-05-22 MED ORDER — MAGNESIUM SULFATE 2 GM/50ML IV SOLN
2.0000 g | Freq: Once | INTRAVENOUS | Status: AC
  Administered 2020-05-22: 2 g via INTRAVENOUS
  Filled 2020-05-22: qty 50

## 2020-05-22 MED ORDER — HYDRALAZINE HCL 10 MG PO TABS: 10.0000 mg | ORAL_TABLET | Freq: Three times a day (TID) | ORAL | Status: DC

## 2020-05-22 NOTE — Evaluation (Addendum)
Physical Therapy Evaluation Patient Details Name: [name] MRN: [id number] DOB: 09/15/1932 Today's Date: 05/22/2020   History of Present Illness  Pt is an 87 y.o. female admitted 05/21/20 with progressively worsening SOB. CXR with no acute abnormality, however borderline cardiomegaly. Acute worsening of symptoms likely multifactorial in setting of  recent resumption of tobacco use, atelectasis, worsening anxiety pertaining to housing situation. PMH includes HF, CAD with chronic angina, CKD3, HTN, DM2, CVA, tongue CA, tobacco use, anxiety, depression.    Clinical Impression  Pt presents with an overall decrease in functional mobility secondary to above. PTA, pt mod indep ambulating with SPC, indep with ADLs, lives alone; pt reports increased difficulty managing household and interested in moving to ILF/ALF secondary to this (CM/SW notified for more info). Today, pt mobilizing with SPC and min guard for balance; pt c/o dizziness with initial standing (see BP values below). Pt limited by generalized weakness, decreased activity tolerance and impaired balance strategies/postural reactions. Pt would benefit from continued acute PT services to maximize functional mobility and independence prior to d/c with HHPT services.    Seated BP - 117/70 Post-mobility BP - 99/64 HR 65 SpO2 98% on RA   Follow Up Recommendations Home health PT (would benefit from ILF/ALF)    Equipment Recommendations  None recommended by PT    Recommendations for Other Services       Precautions / Restrictions Precautions Precautions: Fall Restrictions Weight Bearing Restrictions: No      Mobility  Bed Mobility Overal bed mobility: Modified Independent             General bed mobility comments: HOB elevated    Transfers Overall transfer level: Needs assistance Equipment used: None;Straight cane Transfers: Sit to/from Stand Sit to Stand: Min guard         General transfer comment: Multiple  sit<>stands from EOB, low toilet height (with use of grab bar) and recliner with and without SPC; min guard for balance, pt c/o initial lightheadedness  Ambulation/Gait Ambulation/Gait assistance: Min guard Gait Distance (Feet): 20 Feet Assistive device: Straight cane Gait Pattern/deviations: Step-through pattern;Decreased stride length;Trunk flexed Gait velocity: Decreased   General Gait Details: Slow, mildly unsteady gait with SPC and min guard for balance; pt reaching to furniture/wall/doorway for support of other UE to maintain balance  Stairs            Wheelchair Mobility    Modified Rankin (Stroke Patients Only)       Balance Overall balance assessment: Needs assistance   Sitting balance-Leahy Scale: Good       Standing balance-Leahy Scale: Fair Standing balance comment: Can static stand without UE support; static and dynamic stability improved with UE support                             Pertinent Vitals/Pain Pain Assessment: No/denies pain Pain Intervention(s): Monitored during session    Elmer expects to be discharged to:: Private residence Living Arrangements: Alone Available Help at Discharge: Family;Available PRN/intermittently Type of Home: House Home Access: Level entry     Home Layout: One level Home Equipment: Hand held shower head;Tub bench;Cane - single point;Walker - 4 wheels Additional Comments: Family nearby but pt does not like to ask for help, "They are busy with their work and families" - reports she could call if she really needed to    Prior Function Level of Independence: Independent with assistive device(s)  Comments: Mod indep with household and community ambulation using SPC; does not drive; reports increased time to walk to bus stop, but does not use rollator "because of my pride." Sits on tub bench to shower, has toilet riser. Reports main difficulty at home is cleaning/household tasks      Hand Dominance        Extremity/Trunk Assessment   Upper Extremity Assessment Upper Extremity Assessment: Generalized weakness    Lower Extremity Assessment Lower Extremity Assessment: Generalized weakness    Cervical / Trunk Assessment Cervical / Trunk Assessment: Kyphotic  Communication   Communication: No difficulties  Cognition Arousal/Alertness: Awake/alert Behavior During Therapy: WFL for tasks assessed/performed Overall Cognitive Status: Within Functional Limits for tasks assessed                                 General Comments: WFL for simple tasks, not formally assessed      General Comments General comments (skin integrity, edema, etc.): Seated BP 117/70, post-mobility BP 99/64; HR 65; SpO2 98% on RA. Increased time discussing SNF for short-term rehab vs. ILF/ALF for long-term living -- pt agrees that she does not require SNF-level therapies, but is interested in ALF/ILF options due to increased difficulty managing household (lives alone); CM/SW notified of pt's request for more info    Exercises     Assessment/Plan    PT Assessment Patient needs continued PT services  PT Problem List Decreased strength;Decreased activity tolerance;Decreased balance;Decreased mobility;Cardiopulmonary status limiting activity       PT Treatment Interventions DME instruction;Gait training;Stair training;Functional mobility training;Therapeutic activities;Therapeutic exercise;Balance training;Patient/family education    PT Goals (Current goals can be found in the Care Plan section)  Acute Rehab PT Goals Patient Stated Goal: Move to ALF/ILF PT Goal Formulation: With patient Time For Goal Achievement: 06/05/20 Potential to Achieve Goals: Good    Frequency Min 3X/week   Barriers to discharge Decreased caregiver support      Co-evaluation               AM-PAC PT "6 Clicks" Mobility  Outcome Measure Help needed turning from your back to your side  while in a flat bed without using bedrails?: None Help needed moving from lying on your back to sitting on the side of a flat bed without using bedrails?: None Help needed moving to and from a bed to a chair (including a wheelchair)?: A Little Help needed standing up from a chair using your arms (e.g., wheelchair or bedside chair)?: A Little Help needed to walk in hospital room?: A Little Help needed climbing 3-5 steps with a railing? : A Little 6 Click Score: 20    End of Session   Activity Tolerance: Patient tolerated treatment well;Patient limited by fatigue Patient left: in chair;with call bell/phone within reach;with chair alarm set Nurse Communication: Mobility status PT Visit Diagnosis: Muscle weakness (generalized) (M62.81);Other abnormalities of gait and mobility (R26.89)    Time: 0840-0900 PT Time Calculation (min) (ACUTE ONLY): 20 min   Charges:   PT Evaluation $PT Eval Moderate Complexity: 1 Mod     Mabeline Caras, PT, DPT Acute Rehabilitation Services  Pager [phone] Office [place] 05/22/2020, 10:13 AM

## 2020-05-22 NOTE — Evaluation (Signed)
Occupational Therapy Evaluation Patient Details Name: [name] MRN: [id number] DOB: [birth year] Today's Date: 05/22/2020    History of Present Illness Pt is an 87 y.o. female admitted 05/21/20 with progressively worsening SOB. CXR with no acute abnormality, however borderline cardiomegaly. Acute worsening of symptoms likely multifactorial in setting of  recent resumption of tobacco use, atelectasis, worsening anxiety pertaining to housing situation. PMH includes HF, CAD with chronic angina, CKD3, HTN, DM2, CVA, tongue CA, tobacco use, anxiety, depression.   Clinical Impression   Pt admitted with the above diagnosis and overall seems to be doing better with the mild deficits listed below. Pt would benefit from cont OT to increase safety and independence with basic adls and adl transfers so she can be safe at home alone. Feel pt is appropriate for independent living (assisted living in her future) and granddaughter agrees.  Social work as been Engineer, [employer].  Spoke with pt about both levels of care and about the need to check in on pt regularly while at home before moving into place. Pt is a fall risk and should wear her life alert at all times despite not wanting to.  Risks of falling discussed with pt.  Feel pt would also benefit from [place] during this time to ensure safety during adls. Will continue to follow with focus on fall prevention during adls.     Follow Up Recommendations  Home health OT;Other (comment);Supervision - Intermittent ([facility] until into assisted/independent living)    Equipment Recommendations  None recommended by OT    Recommendations for Other Services       Precautions / Restrictions Precautions Precautions: Fall Precaution Comments: has not taken any falls Restrictions Weight Bearing Restrictions: No      Mobility Bed Mobility Overal bed mobility: Modified Independent             General bed mobility comments: HOB elevated    Transfers Overall  transfer level: Needs assistance Equipment used: None;Straight cane Transfers: Sit to/from Stand Sit to Stand: Min guard         General transfer comment: Pt requires min guard only due to pt feeling slightly weak. By 3rd repetition of sit to stand, pt was supervision.    Balance Overall balance assessment: Needs assistance   Sitting balance-Leahy Scale: Good     Standing balance support: Single extremity supported;During functional activity Standing balance-Leahy Scale: Fair Standing balance comment: Pt requires cane to be mobile but did stand at recliner with no physical assist.                           ADL either performed or assessed with clinical judgement   ADL Overall ADL's : Needs assistance/impaired Eating/Feeding: Independent;Sitting   Grooming: Wash/dry hands;Wash/dry face;Oral care;Supervision/safety;Standing   Upper Body Bathing: Set up;Sitting   Lower Body Bathing: Min guard;Sit to/from stand   Upper Body Dressing : Set up;Sitting   Lower Body Dressing: Min guard;Sit to/from stand   Toilet Transfer: Min guard;Ambulation;Comfort height toilet (w cane)   Toileting- Clothing Manipulation and Hygiene: Min guard;Sit to/from stand       Functional mobility during ADLs: Min guard General ADL Comments: Pt close to baseline. Pt feels a little weak and unsteady but feels she is close to her baseline.  Pt has a life alert at home that she does not use but is paid for.  Feel pt should use this lifeline when returning home until she is in assisted/independent living.  Vision Patient Visual Report: No change from baseline Vision Assessment?: Vision impaired- to be further tested in functional context Additional Comments: Pt with alignment deficit of eyes.  This is not new to her.  Pt functionally did well with vision and it is unchanged.     Perception Perception Perception Tested?: No   Praxis Praxis Praxis tested?: Within functional limits     Pertinent Vitals/Pain Pain Assessment: Faces Faces Pain Scale: Hurts a little bit Pain Location: back from sitting Pain Descriptors / Indicators: Aching Pain Intervention(s): Monitored during session     Hand Dominance Right   Extremity/Trunk Assessment Upper Extremity Assessment Upper Extremity Assessment: Generalized weakness   Lower Extremity Assessment Lower Extremity Assessment: Defer to PT evaluation   Cervical / Trunk Assessment Cervical / Trunk Assessment: Kyphotic   Communication Communication Communication: No difficulties   Cognition Arousal/Alertness: Awake/alert Behavior During Therapy: WFL for tasks assessed/performed Overall Cognitive Status: Within Functional Limits for tasks assessed                                 General Comments: Pt appears overall at baseline and functional.  Pt and granddaughter do acknowledge some decreased memory.  Both agree that independent or assisted living may be a good option.   General Comments  Pt overall close to baseline but does have mild balance deficits. Spoke granddaughter briefly about independent living vs assisted living and the need for one of them.  Feel pt needs to use her life alert for the time she is at home until moved into new place.  Pt is a fall risk due to weakness.    Exercises     Shoulder Instructions      Home Living Family/patient expects to be discharged to:: Private residence Living Arrangements: Alone Available Help at Discharge: Family;Available PRN/intermittently Type of Home: House Home Access: Stairs to enter [employer] of Steps: 2 Entrance Stairs-Rails: None Home Layout: One level     Bathroom Shower/Tub: Tub/shower unit;Curtain   Bathroom Toilet: Handicapped height     Home Equipment: Hand held shower head;Tub bench;Cane - single point;Walker - 4 wheels   Additional Comments: Family nearby but pt does not like to ask for help, "They are busy with their  work and families" - reports she could call if she really needed to      Prior Functioning/Environment Level of Independence: Independent with assistive device(s)        Comments: Mod indep with household and community ambulation using SPC; does not drive; reports increased time to walk to bus stop and also has someone who takes her to get groceries, but does not use rollator "because of my pride." Sits on tub bench to shower, has toilet riser. Reports main difficulty at home is cleaning/household tasks        OT Problem List: Impaired balance (sitting and/or standing);Decreased knowledge of use of DME or AE;Pain      OT Treatment/Interventions: Self-care/ADL training;Therapeutic activities;DME and/or AE instruction;Balance training    OT Goals(Current goals can be found in the care plan section) Acute Rehab OT Goals Patient Stated Goal: Move to ALF/ILF OT Goal Formulation: With patient/family Time For Goal Achievement: 06/05/20 Potential to Achieve Goals: Good ADL Goals Pt Will Perform Tub/Shower Transfer: with supervision;ambulating;tub bench Additional ADL Goal #1: Pt will walk to bathroom with cane and complete all toileting tasks with mod I. Additional ADL Goal #2: Pt will gather clothes and  dress self with use of cane with mod I. Additional ADL Goal #3: Pt will state two reasons why she needs to wear life alert at home to increase safety without cues.  OT Frequency: Min 2X/week   Barriers to D/C: Decreased caregiver support  Pt lives alone and doesn't want someone staying iwth her.       Co-evaluation              AM-PAC OT "6 Clicks" Daily Activity     Outcome Measure Help from another person eating meals?: None Help from another person taking care of personal grooming?: None Help from another person toileting, which includes using toliet, bedpan, or urinal?: A Little Help from another person bathing (including washing, rinsing, drying)?: A Little Help from  another person to put on and taking off regular upper body clothing?: None Help from another person to put on and taking off regular lower body clothing?: A Little 6 Click Score: 21   End of Session Equipment Utilized During Treatment:  (cane) Nurse Communication: Mobility status  Activity Tolerance: Patient tolerated treatment well Patient left: in chair;with call bell/phone within reach;with chair alarm set  OT Visit Diagnosis: Unsteadiness on feet (R26.81)                Time: 1694-5038 OT Time Calculation (min): 26 min Charges:  OT General Charges $OT Visit: 1 Visit OT Evaluation $OT Eval Moderate Complexity: 1 Mod OT Treatments $Self Care/Home Management : 8-22 mins  Glenford Peers 05/22/2020, 11:11 AM

## 2020-05-22 NOTE — Progress Notes (Signed)
Subjective: Pt sitting in the bed. States her SOB is better but she feels depressed and anxious as she is not able to do her daily chores and get short of breath after moving around the house. She plans to go to a nursing facility if she gets approved. She denies any chest pain. Complains of headache sometimes. Denies any other concerns.   Objective:  Vital signs in last 24 hours: Vitals:   05/22/20 0400 05/22/20 0721 05/22/20 0927 05/22/20 1148  BP:   99/64   Pulse:   65   Resp:   19   Temp:  98.4 F (36.9 C)  (!) 97.5 F (36.4 C)  TempSrc: Oral Oral  Oral  SpO2:   97%   Weight:      Height:       Physical Exam Constitutional: Pt is found sitting in bed comfortably. HEENT- No icterus, No JVD Cardiovascular: regular rate, Systolic murmur present.  Pulmonary: effort normal, B/L Clear lung sounds.  Abdominal: flat, non distended. Musculoskeletal: ROM normal Skin: warm and dry Neurological: alert, no focal deficit Psychiatric: depressed and anxious mood and blunt affect  Assessment/Plan: [name] is a 87 y.o. female with hx of CHF presented with SOB and found to have acute hypoxic respiratory failure.   Active Problems:   Shortness of breath Acute hypoxic respiratory failure Chronic diastolic Heart failure Type 2 DM CKD Stage 3b HTN Tobacco use disorder Alcohol use disorder  #Shortness of breath #Chronic diastolic heart failure  Acute on Chronic CHF. Suspect smoking contributed to acute respiratory failure. S/p 40mg  IV furosemide. Patient does not appear hypervolemic on examination and may be slightly volume depleted. Net since admit +240. Denies any SOB or chest pain. Lungs clear to auscultation.   -Hold home furosemide. -F/U Echo -Incentive spirometry -Albuterol and supplemental oxygen as needed. -Daily weights and I's and O's.   #Anxiety/Depression, chronic #Housing Pt feels depressed and anxious as she is not able to do her daily chores and get short  of breath after moving around the house. Denies SI. She plans to go to a nursing facility if she gets approved. PT recommended Home health PT. OT recommnded Home health OT with intermitted supervision and Home health aid.   #Severe one-vessel CAD with chronic angina  Her symptoms are stable and not acutely worsened prior to admission. Troponin is mildly elevated at 36, however no new ischemic changes on EKG. Aspirin stopped by night team (per IM note 04/13/19).  -Follow-up repeat troponin -EKG as needed for chest pain. -Continue home Plavix 75 mg , carvedilol 12.5mg  twice daily, atorvastatin 40mg  nightly, isosorbide mononitrate 60mg  twice daily.  #Chronic kidney disease stage 3b # Hypokalemia  # Hypomagnesemia Patient's creatinine elevated to 1.8 upon admission although her baseline renal function appears to be approximately 1.3-1.8. Repeat creatine 1.87. Pt appears volume depleted at Exam.  K 3.4 (repleted) and hypomagnesemia (1.6) (Repleted).   -Hold further diuresis -morning RFP and Mg.   #Hypertension, chronic Patient's blood pressure labile since admission. BP now- 80/46 mmHg. Repeat BP 93/49 mmhG. Pt sitting in chair.  C/o slight tightness in neck and lightheadedness. Started on O2. Feels better after that. Likely due to morning Lasix dose.  -Continue home amlodipine 10mg  daily, carvedilol 12.5mg  twice daily (Hold morning dose if BP low in the morning) -Change isosorbide mononitrate 60mg  to once daily (Hold morning dose if BP low in the morning). -Continue to Hold hydralazine. -Continue to monitor vitals.  -Consider giving bolus (500 cc) of  fluids if BP drops again.   #Tobacco use disorder  # Alcohol use disorder  Patient states she smokes and drinks alcohol to relieve her anxiety.  -Pt counseled to stop.   Dispo: Anticipated discharge in approximately 1-2 day(s).   Armando Reichert, MD 05/22/2020, 2:14 PM Pager: [phone] After 5pm on weekdays and 1pm on weekends: On Call  pager [phone]

## 2020-05-22 NOTE — Plan of Care (Signed)
  Problem: Education: Goal: Knowledge of General Education information will improve Description: Including pain rating scale, medication(s)/side effects and non-pharmacologic comfort measures Outcome: Progressing   Problem: Activity: Goal: Risk for activity intolerance will decrease Outcome: Progressing   Problem: Elimination: Goal: Will not experience complications related to urinary retention Outcome: Progressing   Problem: Pain Managment: Goal: General experience of comfort will improve Outcome: Progressing   Problem: Safety: Goal: Ability to remain free from injury will improve Outcome: Progressing   Problem: Skin Integrity: Goal: Risk for impaired skin integrity will decrease Outcome: Progressing   

## 2020-05-22 NOTE — Progress Notes (Signed)
  Echocardiogram 2D Echocardiogram has been performed.  Marybelle Killings 05/22/2020, 3:54 PM

## 2020-05-23 DIAGNOSIS — J9601 Acute respiratory failure with hypoxia: Secondary | ICD-10-CM | POA: Diagnosis present

## 2020-05-23 DIAGNOSIS — N179 Acute kidney failure, unspecified: Secondary | ICD-10-CM

## 2020-05-23 DIAGNOSIS — F419 Anxiety disorder, unspecified: Secondary | ICD-10-CM | POA: Diagnosis present

## 2020-05-23 DIAGNOSIS — I5032 Chronic diastolic (congestive) heart failure: Secondary | ICD-10-CM | POA: Diagnosis not present

## 2020-05-23 DIAGNOSIS — Z20822 Contact with and (suspected) exposure to covid-19: Secondary | ICD-10-CM | POA: Diagnosis present

## 2020-05-23 DIAGNOSIS — I25119 Atherosclerotic heart disease of native coronary artery with unspecified angina pectoris: Secondary | ICD-10-CM | POA: Diagnosis present

## 2020-05-23 DIAGNOSIS — I5033 Acute on chronic diastolic (congestive) heart failure: Secondary | ICD-10-CM | POA: Diagnosis present

## 2020-05-23 DIAGNOSIS — I130 Hypertensive heart and chronic kidney disease with heart failure and stage 1 through stage 4 chronic kidney disease, or unspecified chronic kidney disease: Secondary | ICD-10-CM | POA: Diagnosis present

## 2020-05-23 DIAGNOSIS — E1159 Type 2 diabetes mellitus with other circulatory complications: Secondary | ICD-10-CM | POA: Diagnosis not present

## 2020-05-23 DIAGNOSIS — I358 Other nonrheumatic aortic valve disorders: Secondary | ICD-10-CM | POA: Diagnosis present

## 2020-05-23 DIAGNOSIS — I509 Heart failure, unspecified: Secondary | ICD-10-CM

## 2020-05-23 DIAGNOSIS — N183 Chronic kidney disease, stage 3 unspecified: Secondary | ICD-10-CM | POA: Diagnosis not present

## 2020-05-23 DIAGNOSIS — E876 Hypokalemia: Secondary | ICD-10-CM | POA: Diagnosis present

## 2020-05-23 DIAGNOSIS — H409 Unspecified glaucoma: Secondary | ICD-10-CM | POA: Diagnosis present

## 2020-05-23 DIAGNOSIS — H5462 Unqualified visual loss, left eye, normal vision right eye: Secondary | ICD-10-CM | POA: Diagnosis present

## 2020-05-23 DIAGNOSIS — J209 Acute bronchitis, unspecified: Secondary | ICD-10-CM | POA: Diagnosis present

## 2020-05-23 DIAGNOSIS — F17210 Nicotine dependence, cigarettes, uncomplicated: Secondary | ICD-10-CM | POA: Diagnosis present

## 2020-05-23 DIAGNOSIS — E1122 Type 2 diabetes mellitus with diabetic chronic kidney disease: Secondary | ICD-10-CM | POA: Diagnosis present

## 2020-05-23 DIAGNOSIS — R0602 Shortness of breath: Secondary | ICD-10-CM | POA: Diagnosis present

## 2020-05-23 DIAGNOSIS — Z85810 Personal history of malignant neoplasm of tongue: Secondary | ICD-10-CM | POA: Diagnosis not present

## 2020-05-23 DIAGNOSIS — K219 Gastro-esophageal reflux disease without esophagitis: Secondary | ICD-10-CM | POA: Diagnosis present

## 2020-05-23 DIAGNOSIS — R002 Palpitations: Secondary | ICD-10-CM | POA: Diagnosis present

## 2020-05-23 DIAGNOSIS — Z8673 Personal history of transient ischemic attack (TIA), and cerebral infarction without residual deficits: Secondary | ICD-10-CM | POA: Diagnosis not present

## 2020-05-23 DIAGNOSIS — E7800 Pure hypercholesterolemia, unspecified: Secondary | ICD-10-CM | POA: Diagnosis present

## 2020-05-23 DIAGNOSIS — E861 Hypovolemia: Secondary | ICD-10-CM | POA: Diagnosis present

## 2020-05-23 DIAGNOSIS — F32A Depression, unspecified: Secondary | ICD-10-CM | POA: Diagnosis present

## 2020-05-23 DIAGNOSIS — E785 Hyperlipidemia, unspecified: Secondary | ICD-10-CM | POA: Diagnosis present

## 2020-05-23 DIAGNOSIS — Z66 Do not resuscitate: Secondary | ICD-10-CM | POA: Diagnosis present

## 2020-05-23 DIAGNOSIS — N1832 Chronic kidney disease, stage 3b: Secondary | ICD-10-CM | POA: Diagnosis present

## 2020-05-23 LAB — RENAL FUNCTION PANEL
Albumin: 2.7 g/dL — ABNORMAL LOW (ref 3.5–5.0)
Anion gap: 8 (ref 5–15)
BUN: 26 mg/dL — ABNORMAL HIGH (ref 8–23)
CO2: 24 mmol/L (ref 22–32)
Calcium: 8.5 mg/dL — ABNORMAL LOW (ref 8.9–10.3)
Chloride: 103 mmol/L (ref 98–111)
Creatinine, Ser: 2.76 mg/dL — ABNORMAL HIGH (ref 0.44–1.00)
GFR, Estimated: 16 mL/min — ABNORMAL LOW (ref >60)
Glucose, Bld: 92 mg/dL (ref 70–99)
Phosphorus: 4 mg/dL (ref 2.5–4.6)
Potassium: 3.7 mmol/L (ref 3.5–5.1)
Sodium: 135 mmol/L (ref 135–145)

## 2020-05-23 LAB — CBC
HCT: 31.2 % — ABNORMAL LOW (ref 36.0–46.0)
Hemoglobin: 10 g/dL — ABNORMAL LOW (ref 12.0–15.0)
MCH: 28.9 pg (ref 26.0–34.0)
MCHC: 32.1 g/dL (ref 30.0–36.0)
MCV: 90.2 fL (ref 80.0–100.0)
Platelets: 250 10*3/uL (ref 150–400)
RBC: 3.46 MIL/uL — ABNORMAL LOW (ref 3.87–5.11)
RDW: 17.1 % — ABNORMAL HIGH (ref 11.5–15.5)
WBC: 5.6 10*3/uL (ref 4.0–10.5)
nRBC: 0 % (ref 0.0–0.2)

## 2020-05-23 LAB — MAGNESIUM: Magnesium: 2.3 mg/dL (ref 1.7–2.4)

## 2020-05-23 MED ORDER — LACTATED RINGERS IV BOLUS
500.0000 mL | Freq: Once | INTRAVENOUS | Status: AC
  Administered 2020-05-23: 500 mL via INTRAVENOUS

## 2020-05-23 MED ORDER — NICOTINE 7 MG/24HR TD PT24
7.0000 mg | MEDICATED_PATCH | Freq: Every day | TRANSDERMAL | Status: DC
  Administered 2020-05-23 – 2020-05-24 (×2): 7 mg via TRANSDERMAL
  Filled 2020-05-23 (×3): qty 1

## 2020-05-23 NOTE — Progress Notes (Signed)
   Subjective: Pt feeling better. Pt denies SOB, chest pain. States she is anxious about placement and want to go to an Independent living facility where she can cook her own food. Discussed the results of  ECHO. Discussed that smoking played a role in this respiratory failure. Pt interested in quitting, states she quit without any nicotin last time but will try Nicotine patch this time. Denies any concerns.  Objective:  Vital signs in last 24 hours: Vitals:   05/22/20 1900 05/22/20 2300 05/23/20 0300 05/23/20 0346  BP: 129/60 112/60 (!) 120/57   Pulse: (!) 55 60 60   Resp: 14 18 13    Temp: 97.7 F (36.5 C) 97.6 F (36.4 C) 97.7 F (36.5 C)   TempSrc: Oral Oral Oral   SpO2: 97% 96% 97%   Weight:    55.5 kg  Height:       Physical Exam Constitutional: Sitting in bed comfortably.  No acute distress. HEENT-no icterus, no JVD Cardiovascular: regular rate, systolic murmur present Pulmonary: Effort normal, bilateral clear lung sounds Abdominal: Soft, nondistended. Musculoskeletal: ROM normal. Skin: Warm and dry. Psychiatric: Mood anxious, affect blunted Assessment/Plan: [name] is a 87 y.o. female with hx of CHF presented with SOB and found to have acute hypoxic respiratory failure.   Principal Problem:   Acute bronchitis Active Problems:   Chronic diastolic heart failure (HCC)   Type 2 diabetes mellitus with vascular disease (HCC)   Stage 3 chronic kidney disease due to type 2 diabetes mellitus (HCC)   Shortness of breath Acute hypoxic respiratory failure HTN Tobacco use disorder Alcohol use disorder  #Acute hypoxic respiratory failure #Chronic diastolic heart failure Acute on chronic CHF.  Suspect smoking contributed to acute respiratory failure.  Net since admit -210. 180-450=-210.  Denies any SOB or chest pain.  Denies any SOB or chest pain.  Lungs clear to auscultation.  -holding home frusemide. -Echo- LVEF 50-55%. Grade 1 diastolic dysfunction. Left atrial  mildly dilated. There is moderate thickening of the aortic valve. No significant change from prior study. -Incentive spirometry -Albuterol and supplemental oxygen as needed.  -Daily weights and I's and O's.   #Anxiety/Depression, chronic #Housing  PT recommended Home health PT (ILF/ALF). OT recommnded Home health OT with intermitted supervision and Home health aid at ALF/ILF.  -TOC consult for placement.   #Severe one-vessel CAD with chronic angina -Continue home Plavix 75 mg , carvedilol 12.5mg  twice daily, atorvastatin 40mg  nightly, isosorbide mononitrate 60mg  once daily.  #Chronic kidney disease stage 3b Creatinine increased to 2.76 from 1.87 (baseline creatinine approximately 1.3-1.8).   and BUN 26 increased from 19.   -Bolus of 500 ml RL given in the morning. -Monitor RFP   #Hypertension, chronic  BP soft yesterday. Today 120/57 mmHg.  Imdur changed to once daily. -Continue home amlodipine 10mg  daily, carvedilol 12.5mg  twice daily, isosorbide mononitrate 60mg  once daily. -Continue to Hold hydralazine. -Continue to monitor vitals.   Tobacco use disorder, chronic -Pt interested in quitting. States she will try Nicotine patch.  -Start Nicotine patch 7mg  /24 hr.  Dispo: Anticipated discharge in approximately 1-2 day(s).   Armando Reichert, MD 05/23/2020, 6:13 AM Pager: [phone] After 5pm on weekdays and 1pm on weekends: On Call pager [phone]

## 2020-05-23 NOTE — Progress Notes (Signed)
Mobility Specialist: Progress Note   05/23/20 1115  Mobility  Activity Ambulated in hall  Level of Assistance Minimal assist, patient does 75% or more  Assistive Device Cane  Distance Ambulated (ft) 100 ft  Mobility Ambulated with assistance in hallway  Mobility Response Tolerated well  Mobility performed by Mobility specialist  Bed Position Chair  $Mobility charge 1 Mobility   Pre-Mobility: 64 HR, 151/66 BP, 98% SpO2 Post-Mobility: 64 HR, 95% SpO2  Pt stopped for a brief standing break due to feeling fatigued after 50'. Pt c/o LLE weakness on the way back to the room, otherwise asx. Pt to chair after walk per request with family member present in room.   [facility] Mehkai Gallo Mobility Specialist Mobility Specialist Phone: [phone]

## 2020-05-23 NOTE — Progress Notes (Signed)
Physical Therapy Treatment Patient Details Name: [name] MRN: [id number] DOB: 07-17-1932 Today's Date: 05/23/2020    History of Present Illness Pt is an 87 y.o. female admitted 05/21/20 with progressively worsening SOB. CXR with no acute abnormality, however borderline cardiomegaly. Acute worsening of symptoms likely multifactorial in setting of  recent resumption of tobacco use, atelectasis, worsening anxiety pertaining to housing situation. PMH includes HF, CAD with chronic angina, CKD3, HTN, DM2, CVA, tongue CA, tobacco use, anxiety, depression.   PT Comments    Pt progressing with mobility. Tolerated transfer and gait training with SPC, demonstrates improved stability with intermittent min guard for balance. Pt motivated to participate and hopeful for d/c home tomorrow. VSS on RA with HR 70s. Will continue to follow acutely.   Follow Up Recommendations  Home health PT;Supervision - Intermittent (would benefit from ILF/ALF)     Equipment Recommendations  None recommended by PT    Recommendations for Other Services       Precautions / Restrictions Precautions Precautions: Fall Restrictions Weight Bearing Restrictions: No    Mobility  Bed Mobility Overal bed mobility: Modified Independent             General bed mobility comments: HOB elevated    Transfers Overall transfer level: Needs assistance Equipment used: Straight cane Transfers: Sit to/from Stand Sit to Stand: Supervision            Ambulation/Gait Ambulation/Gait assistance: Min guard Gait Distance (Feet): 136 Feet Assistive device: Straight cane Gait Pattern/deviations: Step-through pattern;Decreased stride length;Trunk flexed Gait velocity: Decreased   General Gait Details: Slow, mostly steady gait with SPC and intermittent min guard for balance; improved stability from prior session without reaching to furniture for additional UE support; pt with good awareness of activity  pacing   Stairs             Wheelchair Mobility    Modified Rankin (Stroke Patients Only)       Balance Overall balance assessment: Needs assistance   Sitting balance-Leahy Scale: Good       Standing balance-Leahy Scale: Fair Standing balance comment: Can static stand at sink without UE support to wash hands and face; static and dynamic stability improved with at least single UE support                            Cognition Arousal/Alertness: Awake/alert Behavior During Therapy: WFL for tasks assessed/performed Overall Cognitive Status: Within Functional Limits for tasks assessed                                 General Comments: Pt appears overall at baseline and functional.  Per OT note 5/11 - pt and granddaughter do acknowledge some decreased memory; both agree that ILF or ALF may be a good option.      Exercises      General Comments General comments (skin integrity, edema, etc.): Discussed DME recommendations and fall risk, including use of rollator when ambulating outside (especially to bus stop), but pt remains resistant to using anything other than her SPC. SpO2 98% on RA, HR 70s      Pertinent Vitals/Pain Pain Assessment: No/denies pain Pain Intervention(s): Monitored during session    Home Living                      Prior Function  PT Goals (current goals can now be found in the care plan section) Progress towards PT goals: Progressing toward goals    Frequency    Min 3X/week      PT Plan Current plan remains appropriate    Co-evaluation              AM-PAC PT "6 Clicks" Mobility   Outcome Measure  Help needed turning from your back to your side while in a flat bed without using bedrails?: None Help needed moving from lying on your back to sitting on the side of a flat bed without using bedrails?: None Help needed moving to and from a bed to a chair (including a wheelchair)?: A  Little Help needed standing up from a chair using your arms (e.g., wheelchair or bedside chair)?: A Little Help needed to walk in hospital room?: A Little Help needed climbing 3-5 steps with a railing? : A Little 6 Click Score: 20    End of Session Equipment Utilized During Treatment: Gait belt Activity Tolerance: Patient tolerated treatment well Patient left: in chair;with call bell/phone within reach;with chair alarm set Nurse Communication: Mobility status PT Visit Diagnosis: Muscle weakness (generalized) (M62.81);Other abnormalities of gait and mobility (R26.89)     Time: 0413-6438 PT Time Calculation (min) (ACUTE ONLY): 19 min  Charges:  $Gait Training: 8-22 mins                    Mabeline Caras, PT, DPT Acute Rehabilitation Services  Pager [phone] Office [place] 05/23/2020, 5:10 PM

## 2020-05-23 NOTE — Discharge Summary (Addendum)
Name: [name] MRN: [id number] DOB: 27-Aug-1932 87 y.o. PCP: Dierdre Harness, FNP  Date of Admission: 05/21/2020  4:20 PM Date of Discharge: 05/24/2020 Attending Physician: Lucious Groves, DO   Discharge Diagnosis: 1. Acute Respiratory failure 2. Chronic Diastolic heart failure 3. Anxiety 4. 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 Chronic kidney disease stage 3b 6. Hypertension 7. Type II DM 8. Alcohol use disorder 9. Tobacco use disorder, chronic  Discharge Medications: Allergies as of 05/24/2020      Reactions   Tape Other (See Comments)   SKIN IS SENSITIVE      Medication List    STOP taking these medications   Alphagan P 0.1 % Soln Generic drug: brimonidine   aspirin EC 81 MG tablet   furosemide 40 MG tablet Commonly known as: LASIX   multivitamin with minerals Tabs tablet   Travatan Z 0.004 % Soln ophthalmic solution Generic drug: Travoprost (BAK Free)     TAKE these medications   acetaminophen 325 MG tablet Commonly known as: TYLENOL Take 2 tablets (650 mg total) by mouth every 6 (six) hours as needed for mild pain or fever (or temp > 37.5 C (99.5 F)).   albuterol 108 (90 Base) MCG/ACT inhaler Commonly known as: VENTOLIN HFA Inhale 2 puffs into the lungs every 6 (six) hours as needed for wheezing or shortness of breath.   amLODipine 10 MG tablet Commonly known as: NORVASC Take 1 tablet (10 mg total) by mouth daily.   atorvastatin 40 MG tablet Commonly known as: LIPITOR Take 1 tablet (40 mg total) by mouth daily at 6 PM. What changed: when to take this   carvedilol 12.5 MG tablet Commonly known as: COREG Take 1 tablet (12.5 mg total) by mouth 2 (two) times daily with a meal.   cetirizine 10 MG tablet Commonly known as: ZYRTEC Take 10 mg by mouth daily as needed for allergies or rhinitis.   clopidogrel 75 MG tablet Commonly known as: PLAVIX Take 75 mg by mouth daily.   fluticasone 50 MCG/ACT nasal spray Commonly known as:  FLONASE Place 1-2 sprays into both nostrils daily as needed for allergies or rhinitis.   folic acid 1 MG tablet Commonly known as: FOLVITE Take 1 tablet (1 mg total) by mouth daily.   gabapentin 300 MG capsule Commonly known as: NEURONTIN Take 1 capsule (300 mg total) by mouth daily. Start taking on: May 25, 2020 What changed: when to take this   guaiFENesin 600 MG 12 hr tablet Commonly known as: MUCINEX Take 1 tablet (600 mg total) by mouth 2 (two) times daily as needed for to loosen phlegm.   hydrALAZINE 10 MG tablet Commonly known as: APRESOLINE TAKE 1 TABLET(10 MG) BY MOUTH THREE TIMES DAILY What changed: See the new instructions.   isosorbide mononitrate 60 MG 24 hr tablet Commonly known as: IMDUR Take 1 tablet (60 mg total) by mouth daily. What changed: when to take this   latanoprost 0.005 % ophthalmic solution Commonly known as: XALATAN Place 1 drop into the right eye at bedtime.   neomycin-polymyxin-hydrocortisone 3.5-10000-1 OTIC suspension Commonly known as: CORTISPORIN Place 2-3 drops into the right ear at bedtime.   Neuriva Plus Caps Take 1 capsule by mouth daily with breakfast.   nicotine 7 mg/24hr patch Commonly known as: NICODERM CQ - dosed in mg/24 hr Place 1 patch (7 mg total) onto the skin daily. Start taking on: May 25, 2020   nitroGLYCERIN 0.4 MG SL tablet Commonly known as:  NITROSTAT Place 1 tablet (0.4 mg total) under the tongue every 5 (five) minutes as needed for chest pain.   omeprazole 20 MG capsule Commonly known as: PRILOSEC TAKE 1 CAPSULE(20 MG) BY MOUTH DAILY What changed: See the new instructions.   One-A-Day Womens 50+ Tabs Take 1 tablet by mouth daily with breakfast.       Disposition and follow-up:   [name] was discharged from [facility] in Stable condition.  At the hospital follow up visit please address:  1.  Follow up: Marland Kitchen Follow up with Cardiology- Make appointment in 1 week. . Follow up  with PCP- Make appointment in 1 week.    2.  Labs / imaging needed at time of follow-up: None  3.  Pending labs/ test needing follow-up: None  Follow-up Appointments:  Follow-up Information    Dierdre Harness, FNP. Schedule an appointment as soon as possible for a visit in 1 week(s).   Specialty: Family Medicine Contact information: [place] [address] [phone]        Belva Crome, MD Follow up in 1 week(s).   Specialty: Cardiology Contact information: [address] [address] [phone]        [facility] Follow up.   Why: HHPT, HHOT, HHAIDE, Social Worker  please contact [place] at [phone] with any questions about the [facility] services. Contact information: [place] [address] [place] Hospital Course by problem list: [name] is an 87 year old female admitted for evaluation of shortness of breath found to have acute hypoxic respiratory failure. #Acute hypoxic respiratory failure #Chronic diastolic heart failure Patient presented with chronic shortness of breath with acute worsening over two days. Her acute worsening of symptoms likely multifactorial in the setting of her recent resumption of tobacco use, atelectasis and worsening anxiety pertaining to her housing situation. In the ED, Patient was saturating at 88%, she received one dose of 40 mg IV furosemide.  Patient did not appear hypervolemic on exam but her BNP was elevated to 908.5 at admission.  Chest x-ray was negative for any acute abnormality.  ECHO showed LVEF 50-55%. Grade 1 diastolic dysfunction but no significant change from prior study.  Furosemide was held due to Pt being volume depleted.  She was treated with albuterol and monitoring her weight and intake and output. Patient's breathing improved.  #Anxiety/Depression, chronic #Housing Patient reported feeling depressed and anxious as  she was not able to do her daily chores and wanted to go to an assisted living facility. PT recommended Home health PT (preferred ILF/ALF). OT recommnded Home health OT with intermitted supervision and Home health aid, ILF/ALF. TOC consult placed and CSW made referrals for home Pt's health needs.  #Severe one-vessel CAD with chronic angina History of chronic chest tightness with exertion secondary to severe one-vessel CAD.  Her symptoms were stable, no new ischemic changes on EKG. Troponin trended flat. Suspect anxiety contributing to these symptoms. Patient's home medications were continued during this admission.  Aspirin was stopped as (per IM note 04/13/19) and Imdur was changed to once daily.  #Chronic kidney disease stage 3b Likely due to diuresis by Lasix. Patient's creatinine elevated to 1.8 upon admission  with baseline of1.3-1.8.  Lasix was held due to being hypovolemic. RFP monitored and remained stable. Gabapentin reduced to 300 mg once daily due to reduced renal function.   #  Hypertension, chronic Patient blood pressure labile during inpatient. Her home medication continued. Lasix held.  Imdur changed to once daily. Hydralazine held initially but restarted at discharge.  Tobacco use disorder Patient started smoking again after quitting for many years.  She was started on nicotine patch.  Subjective on day of discharge: Pt denies any SOB, Chest pain. Has chronic chest tightness which worsen with anxiety. Pt agrees with discharge today. States her granddaughter will work with CSW for placement to ILF/ALF.   Discharge Exam:   BP (!) 160/61 (BP Location: Left Arm)   Pulse 65   Temp 98.1 F (36.7 C) (Oral)   Resp (!) 22   Ht 5' (1.524 m)   Wt 56.6 kg   SpO2 95%   BMI 24.37 kg/m  Discharge exam: Physical Exam Vitals and nursing note reviewed.  Constitutional:      General: She is not in acute distress.    Appearance: She is not toxic-appearing or diaphoretic.  HENT:     Head:  Normocephalic and atraumatic.  Cardiovascular:     Rate and Rhythm: Normal rate and regular rhythm.     Heart sounds: No murmur heard.   Pulmonary:     Effort: Pulmonary effort is normal.     Breath sounds: No decreased breath sounds, wheezing, rhonchi or rales.  Abdominal:     Palpations: Abdomen is soft. There is no mass.  Musculoskeletal:        General: Normal range of motion.     Right lower leg: No edema.     Left lower leg: No edema.  Skin:    General: Skin is warm and dry.  Neurological:     General: No focal deficit present.     Mental Status: She is alert and oriented to person, place, and time.     Pertinent Labs, Studies, and Procedures:  DG Chest Portable 1 View  Result Date: 05/21/2020 CLINICAL DATA:  Hypoxia and cough. EXAM: PORTABLE CHEST 1 VIEW COMPARISON:  Radiograph and CT 11/23/2019 FINDINGS: Patient is rotated. Borderline cardiomegaly. Similar aortic atherosclerosis and tortuosity allowing for differences in technique. No acute airspace disease, pleural effusion, or pneumothorax. No pulmonary edema. Bones are under mineralized. No acute osseous abnormalities are seen. IMPRESSION: 1. No acute abnormality. 2. Borderline cardiomegaly.  Aortic Atherosclerosis (ICD10-I70.0). Electronically Signed   By: Keith Rake M.D.   On: 05/21/2020 17:05   ECHOCARDIOGRAM COMPLETE  Result Date: 05/22/2020    ECHOCARDIOGRAM REPORT   Patient Name:   [name] Date of Exam: 05/22/2020 Medical Rec #:  [id number]        Height:       60.0 in Accession #:    [id number]       Weight:       126.8 lb Date of Birth:  [birth year]        BSA:          1.538 m Patient Age:    63 years         BP:           172/75 mmHg Patient Gender: F                HR:           59 bpm. Exam Location:  Inpatient Procedure: 2D Echo, Cardiac Doppler and Color Doppler Indications:    Chest pain  History:        Patient has prior history of Echocardiogram examinations, most  recent 11/24/2019.  CHF, CAD, Signs/Symptoms:Shortness of Breath;                 Risk Factors:Dyslipidemia, Hypertension and Current Smoker.                 Chronic angina. CKD. Hx CVA.  Sonographer:    Clayton Lefort RDCS (AE) Referring Phys: [place]  1. Left ventricular ejection fraction, by estimation, is 50 to 55%. Left ventricular ejection fraction by 3D volume is 49 %. The left ventricle has low normal function. The left ventricle demonstrates regional wall motion abnormalities (see scoring diagram/findings for description). Left ventricular diastolic parameters are consistent with Grade I diastolic dysfunction (impaired relaxation). Elevated left atrial pressure. There is mild hypokinesis of the left ventricular, basal inferoseptal wall, inferior wall and inferolateral wall.  2. Right ventricular systolic function is normal. The right ventricular size is normal. There is normal pulmonary artery systolic pressure.  3. Left atrial size was mildly dilated.  4. The mitral valve is normal in structure. Trivial mitral valve regurgitation. No evidence of mitral stenosis. Moderate mitral annular calcification.  5. The aortic valve is tricuspid. There is mild calcification of the aortic valve. There is moderate thickening of the aortic valve. Aortic valve regurgitation is mild. Mild aortic valve stenosis.  6. The inferior vena cava is dilated in size with <50% respiratory variability, suggesting right atrial pressure of 15 mmHg. Comparison(s): No significant change from prior study. Prior images reviewed side by side. FINDINGS  Left Ventricle: Left ventricular ejection fraction, by estimation, is 50 to 55%. Left ventricular ejection fraction by 3D volume is 49 %. The left ventricle has low normal function. The left ventricle demonstrates regional wall motion abnormalities. Mild hypokinesis of the left ventricular, basal inferoseptal wall, inferior wall and inferolateral wall. The left ventricular internal cavity size  was normal in size. There is no left ventricular hypertrophy. Left ventricular diastolic parameters are consistent with Grade I diastolic dysfunction (impaired relaxation). Elevated left atrial pressure. Right Ventricle: The right ventricular size is normal. No increase in right ventricular wall thickness. Right ventricular systolic function is normal. There is normal pulmonary artery systolic pressure. The tricuspid regurgitant velocity is 2.16 m/s, and  with an assumed right atrial pressure of 8 mmHg, the estimated right ventricular systolic pressure is 19.1 mmHg. Left Atrium: Left atrial size was mildly dilated. Right Atrium: Right atrial size was normal in size. Pericardium: There is no evidence of pericardial effusion. Mitral Valve: The mitral valve is normal in structure. Moderate mitral annular calcification. Trivial mitral valve regurgitation. No evidence of mitral valve stenosis. MV peak gradient, 6.9 mmHg. The mean mitral valve gradient is 1.0 mmHg. Tricuspid Valve: The tricuspid valve is normal in structure. Tricuspid valve regurgitation is mild . No evidence of tricuspid stenosis. Aortic Valve: The aortic valve is tricuspid. There is mild calcification of the aortic valve. There is moderate thickening of the aortic valve. Aortic valve regurgitation is mild. Aortic regurgitation PHT measures 726 msec. Mild aortic stenosis is present. Aortic valve mean gradient measures 7.0 mmHg. Aortic valve peak gradient measures 15.4 mmHg. Aortic valve area, by VTI measures 1.68 cm. Pulmonic Valve: The pulmonic valve was normal in structure. Pulmonic valve regurgitation is not visualized. No evidence of pulmonic stenosis. Aorta: The aortic root is normal in size and structure. Venous: The inferior vena cava is dilated in size with less than 50% respiratory variability, suggesting right atrial pressure of 15 mmHg. IAS/Shunts: No atrial level shunt  detected by color flow Doppler.  LEFT VENTRICLE PLAX 2D LVIDd:          4.30 cm         Diastology LVIDs:         2.80 cm         LV e' medial:    1.99 cm/s LV PW:         1.00 cm         LV E/e' medial:  25.8 LV IVS:        1.10 cm         LV e' lateral:   2.82 cm/s LVOT diam:     1.90 cm         LV E/e' lateral: 18.2 LV SV:         66 LV SV Index:   43 LVOT Area:     2.84 cm        3D Volume EF                                LV 3D EF:    Left                                             ventricular                                             ejection                                             fraction by                                             3D volume                                             is 49 %.                                 3D Volume EF:                                3D EF:        49 %                                LV EDV:       138 ml                                LV ESV:       70 ml  LV SV:        68 ml RIGHT VENTRICLE             IVC RV Basal diam:  2.40 cm     IVC diam: 1.90 cm RV S prime:     13.10 cm/s TAPSE (M-mode): 2.1 cm LEFT ATRIUM             Index       RIGHT ATRIUM           Index LA diam:        4.10 cm 2.67 cm/m  RA Area:     13.40 cm LA Vol (A2C):   93.4 ml 60.74 ml/m RA Volume:   26.40 ml  17.17 ml/m LA Vol (A4C):   49.9 ml 32.45 ml/m LA Biplane Vol: 69.8 ml 45.39 ml/m  AORTIC VALVE AV Area (Vmax):    1.23 cm AV Area (Vmean):   1.31 cm AV Area (VTI):     1.68 cm AV Vmax:           196.00 cm/s AV Vmean:          126.000 cm/s AV VTI:            0.393 m AV Peak Grad:      15.4 mmHg AV Mean Grad:      7.0 mmHg LVOT Vmax:         84.80 cm/s LVOT Vmean:        58.400 cm/s LVOT VTI:          0.233 m LVOT/AV VTI ratio: 0.59 AI PHT:            726 msec  AORTA Ao Root diam: 3.20 cm MITRAL VALVE                TRICUSPID VALVE MV Area (PHT): 2.36 cm     TR Peak grad:   18.7 mmHg MV Area VTI:   1.83 cm     TR Vmax:        216.00 cm/s MV Peak grad:  6.9 mmHg MV Mean grad:  1.0 mmHg     SHUNTS MV Vmax:       1.31 m/s      Systemic VTI:  0.23 m MV Vmean:      51.3 cm/s    Systemic Diam: 1.90 cm MV Decel Time: 322 msec MV E velocity: 51.40 cm/s MV A velocity: 131.00 cm/s MV E/A ratio:  0.39 Mihai Croitoru MD Electronically signed by Sanda Klein MD Signature Date/Time: 05/22/2020/5:03:19 PM    Final       Discharge Instructions: Discharge Instructions    Call MD for:  difficulty breathing, headache or visual disturbances   Complete by: As directed    Call MD for:  severe uncontrolled pain   Complete by: As directed    Call MD for:  temperature >100.4   Complete by: As directed    Diet - low sodium heart healthy   Complete by: As directed    Increase activity slowly   Complete by: As directed       Signed: Armando Reichert, MD 05/24/2020, 2:14 PM   Pager: [phone]

## 2020-05-23 NOTE — Plan of Care (Signed)
  Problem: Education: Goal: Knowledge of General Education information will improve Description: Including pain rating scale, medication(s)/side effects and non-pharmacologic comfort measures Outcome: Progressing   Problem: Activity: Goal: Risk for activity intolerance will decrease Outcome: Progressing   Problem: Elimination: Goal: Will not experience complications related to urinary retention Outcome: Progressing   Problem: Pain Managment: Goal: General experience of comfort will improve Outcome: Progressing   Problem: Safety: Goal: Ability to remain free from injury will improve Outcome: Progressing   Problem: Skin Integrity: Goal: Risk for impaired skin integrity will decrease Outcome: Progressing   

## 2020-05-23 NOTE — Progress Notes (Signed)
Heart Failure Nurse Navigator Progress Note  Pt screened for HV TOC appropriateness. Admission appears to be related more to pulmonary issues.   Navigator available to reassess or for resources/education.  Pricilla Holm, RN, BSN Heart Failure Nurse Navigator [phone]

## 2020-05-23 NOTE — Plan of Care (Signed)
  Problem: Education: Goal: Knowledge of General Education information will improve Description: Including pain rating scale, medication(s)/side effects and non-pharmacologic comfort measures Outcome: Progressing   Problem: Elimination: Goal: Will not experience complications related to urinary retention Outcome: Progressing   Problem: Pain Managment: Goal: General experience of comfort will improve Outcome: Progressing   Problem: Skin Integrity: Goal: Risk for impaired skin integrity will decrease Outcome: Progressing   

## 2020-05-24 DIAGNOSIS — E1159 Type 2 diabetes mellitus with other circulatory complications: Secondary | ICD-10-CM

## 2020-05-24 LAB — CBC
HCT: 30.5 % — ABNORMAL LOW (ref 36.0–46.0)
Hemoglobin: 9.8 g/dL — ABNORMAL LOW (ref 12.0–15.0)
MCH: 28.9 pg (ref 26.0–34.0)
MCHC: 32.1 g/dL (ref 30.0–36.0)
MCV: 90 fL (ref 80.0–100.0)
Platelets: 233 10*3/uL (ref 150–400)
RBC: 3.39 MIL/uL — ABNORMAL LOW (ref 3.87–5.11)
RDW: 16.8 % — ABNORMAL HIGH (ref 11.5–15.5)
WBC: 5.1 10*3/uL (ref 4.0–10.5)
nRBC: 0 % (ref 0.0–0.2)

## 2020-05-24 LAB — RENAL FUNCTION PANEL
Albumin: 2.5 g/dL — ABNORMAL LOW (ref 3.5–5.0)
Anion gap: 8 (ref 5–15)
BUN: 26 mg/dL — ABNORMAL HIGH (ref 8–23)
CO2: 23 mmol/L (ref 22–32)
Calcium: 8.6 mg/dL — ABNORMAL LOW (ref 8.9–10.3)
Chloride: 102 mmol/L (ref 98–111)
Creatinine, Ser: 2.41 mg/dL — ABNORMAL HIGH (ref 0.44–1.00)
GFR, Estimated: 19 mL/min — ABNORMAL LOW (ref >60)
Glucose, Bld: 92 mg/dL (ref 70–99)
Phosphorus: 3.8 mg/dL (ref 2.5–4.6)
Potassium: 3.7 mmol/L (ref 3.5–5.1)
Sodium: 133 mmol/L — ABNORMAL LOW (ref 135–145)

## 2020-05-24 MED ORDER — GABAPENTIN 300 MG PO CAPS: 300.0000 mg | ORAL_CAPSULE | Freq: Every day | ORAL | 0 refills | Status: DC

## 2020-05-24 MED ORDER — GUAIFENESIN ER 600 MG PO TB12: 600.0000 mg | ORAL_TABLET | Freq: Two times a day (BID) | ORAL | 0 refills | Status: DC | PRN

## 2020-05-24 MED ORDER — NICOTINE 7 MG/24HR TD PT24: 7.0000 mg | MEDICATED_PATCH | Freq: Every day | TRANSDERMAL | 0 refills | Status: DC

## 2020-05-24 MED ORDER — GABAPENTIN 300 MG PO CAPS
300.0000 mg | ORAL_CAPSULE | Freq: Every day | ORAL | Status: DC
  Administered 2020-05-24: 300 mg via ORAL
  Filled 2020-05-24: qty 1

## 2020-05-24 MED ORDER — ISOSORBIDE MONONITRATE ER 60 MG PO TB24: 60.0000 mg | ORAL_TABLET | Freq: Every day | ORAL | 0 refills | Status: DC

## 2020-05-24 NOTE — Progress Notes (Signed)
Mobility Specialist: Progress Note   05/24/20 1056  Mobility  Activity Ambulated in hall  Level of Assistance Minimal assist, patient does 75% or more  Assistive Device [facility] Ambulated (ft) 270 ft  Mobility Ambulated with assistance in hallway  Mobility Response Tolerated well  Mobility performed by Mobility specialist  $Mobility charge 1 Mobility   Pre-Mobility: 61 HR, 162/65 BP, 98% SpO2 Post-Mobility: 59 HR, 98% SpO2  Pt c/o feeling SOB during ambulation and required a brief stand break. Pt also c/o feeling dizzy on the way back to her room but was better after getting back in bed. Pt in bed per request with bed alarm on.   [facility] Jaydalynn Olivero Mobility Specialist Mobility Specialist Phone: [phone]

## 2020-05-24 NOTE — TOC Progression Note (Addendum)
Transition of Care [facility]) - Progression Note    Patient Details  Name: Harlem Thresher MRN: [id number] Date of Birth: 1932/06/07  Transition of Care [facility]) CM/SW Contact  Zenon Mayo, RN Phone Number: 05/24/2020, 11:25 AM  Clinical Narrative:    NCM spoke with patient, she states she has a rollator at home.  She states she would like for me to speak with her granddaughter, [name].  NCM contacted granddaughter, offered choice for HH servcices , she does not have a preference,   Patient lives alone, but they are looking into getting her into a IDL , she would like for CSW to contact her to discuss some IDL's and transportation,  NCM informed [place] CSW to contact granddaughter.  NCM made referral to [facility] with [facility]. Awaiting call back. Stacie with [facility] is not able to take referral.  NCM made referral to [facility] with [facility] , she is able to take referral for [place], HHOT, HHAIDE and Education officer, museum.  Soc will begin 24 to 48 hrs post dc.        Expected Discharge Plan and Services                                                 Social Determinants of Health (SDOH) Interventions    Readmission Risk Interventions Readmission Risk Prevention Plan 04/11/2019  Transportation Screening Complete  PCP or Specialist Appt within 3-5 Days Complete  HRI or [place] Complete  Social Work Consult for [place] Planning/Counseling Complete  Palliative Care Screening Not Applicable  Medication Review [employer] photographer) Complete  Some recent data might be hidden

## 2020-05-24 NOTE — Progress Notes (Signed)
Occupational Therapy Treatment Patient Details Name: [name] MRN: [id number] DOB: [birth year] Today's Date: 05/24/2020    History of present illness Pt is an 87 y.o. female admitted 05/21/20 with progressively worsening SOB. CXR with no acute abnormality, however borderline cardiomegaly. Acute worsening of symptoms likely multifactorial in setting of  recent resumption of tobacco use, atelectasis, worsening anxiety pertaining to housing situation. PMH includes HF, CAD with chronic angina, CKD3, HTN, DM2, CVA, tongue CA, tobacco use, anxiety, depression.   OT comments  Pt making steady progress towards OT goals this session. Pt completed standing grooming tasks at sink ~ 3 mins with min guard assist for safety, pt additionally able to complete functional mobility in hallway with no rest break in between with [facility] and min guard assist. Time spent encouraging pt to ask for help as needed as pt reports she doesn't like to have her granddaughter cook for her because she would rather do it herself. Encouraged use of rollator as pt could use seat to transport items during IADLs or as energy conservation strategy when pt needs a seated rest break. Pt ultimately agrees that moving to ILF/ALF is safest option at this point, additionally encouraged pt to wear life alert at home as pt reports it currently hangs on her TV. Pt on RA during session with SpO2 > 91% during session; HR in Afib formation but max HR 77 bpm. DC plan remains appropriate, will follow acutely per POC.    Follow Up Recommendations  Home health OT;Other (comment);Supervision - Intermittent ([facility] until into assisted/independent living)    Equipment Recommendations  None recommended by OT    Recommendations for Other Services      Precautions / Restrictions Precautions Precautions: Fall Precaution Comments: has not taken any falls Restrictions Weight Bearing Restrictions: No       Mobility Bed Mobility                General bed mobility comments: OOB in recliner and returned to recliner at end of session    Transfers Overall transfer level: Needs assistance Equipment used: Straight cane Transfers: Sit to/from Stand Sit to Stand: Min guard         General transfer comment: min guard for line mgmt to rise from recliner    Balance Overall balance assessment: Needs assistance Sitting-balance support: Feet supported;No upper extremity supported Sitting balance-Leahy Scale: Good Sitting balance - Comments: sitting on edge of recliner unsupported   Standing balance support: No upper extremity supported;During functional activity Standing balance-Leahy Scale: Fair Standing balance comment: standing at sink unsupported to complete ADLs                           ADL either performed or assessed with clinical judgement   ADL Overall ADL's : Needs assistance/impaired     Grooming: Wash/dry face;Oral care;Standing;Min guard Grooming Details (indicate cue type and reason): minguard for safety standing at sink ~ 3 mins for grooming tasks, no rest break needed after standing tasks         Upper Body Dressing : Minimal assistance;Sitting Upper Body Dressing Details (indicate cue type and reason): to don posterior gown     Toilet Transfer: Min guard;Ambulation [facility]) Toilet Transfer Details (indicate cue type and reason): simulated via functional mobility with min guard assist with [facility]       Tub/Shower Transfer Details (indicate cue type and reason): pt reports tub shower at home with seat Functional mobility during ADLs:  Min guard;Cane General ADL Comments: pt completing grooming tasks at sink and functional mobility greater than a household distance with SPC. education provided on using rollator as energy conservation strategy as pt reports she doesn't use it because of her "pride" offered other energy conservation strategies such as using reacher as pt reports she gets tired when  reaching up, heavily reiterated importance of WEARING life alert as pt reports it hangs on her TV.     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    Pertinent Vitals/ Pain       Pain Assessment: No/denies pain  Home Living                                          Prior Functioning/Environment              Frequency  Min 2X/week        Progress Toward Goals  OT Goals(current goals can now be found in the care plan section)  Progress towards OT goals: Progressing toward goals  Acute Rehab OT Goals Patient Stated Goal: Move to ALF/ILF OT Goal Formulation: With patient Time For Goal Achievement: 06/05/20 Potential to Achieve Goals: Good  Plan Discharge plan remains appropriate;Frequency remains appropriate    Co-evaluation                 AM-PAC OT "6 Clicks" Daily Activity     Outcome Measure   Help from another person eating meals?: None Help from another person taking care of personal grooming?: None Help from another person toileting, which includes using toliet, bedpan, or urinal?: A Little Help from another person bathing (including washing, rinsing, drying)?: A Little Help from another person to put on and taking off regular upper body clothing?: None Help from another person to put on and taking off regular  lower body clothing?: A Little 6 Click Score: 21    End of Session Equipment Utilized During Treatment: Gait belt;Other (comment) (SPC)  OT Visit Diagnosis: Unsteadiness on feet (R26.81)   Activity Tolerance Patient tolerated treatment well   Patient Left in chair;with call bell/phone within reach;with chair alarm set   Nurse Communication Mobility status        Time: 1324-4010 OT Time Calculation (min): 21 min  Charges: OT General Charges $OT Visit: 1 Visit OT Treatments $Self Care/Home Management : 8-22 mins  Harley Alto., COTA/L Acute Rehabilitation Services [place]    Precious Haws 05/24/2020, 9:17 AM

## 2020-05-24 NOTE — TOC Transition Note (Signed)
Transition of Care [facility]) - CM/SW Discharge Note   Patient Details  Name: [name] MRN: [id number] Date of Birth: Feb 10, 1932  Transition of [facility]) CM/SW Contact:  Zenon Mayo, RN Phone Number: 05/24/2020, 1:19 PM   Clinical Narrative:    NCM spoke with patient, she states she has a rollator at home.  She states she would like for me to speak with her granddaughter, [name].  NCM contacted granddaughter, offered choice for HH servcices , she does not have a preference,   Patient lives alone, but they are looking into getting her into a IDL , she would like for CSW to contact her to discuss some IDL's and transportation,  NCM informed [place] CSW to contact granddaughter.  NCM made referral to [facility] with [facility] , she is able to take referral for [place], HHOT, HHAIDE and Education officer, museum.  Soc will begin 24 to 48 hrs post dc.    Final next level of care: [place] Barriers to Discharge: No Barriers Identified   Patient Goals and CMS Choice Patient states their goals for this hospitalization and ongoing recovery are:: return home CMS Medicare.gov Compare Post Acute Care list provided to:: Patient Represenative (must comment) Choice offered to / list presented to : Adult Children  Discharge Placement                       Discharge Plan and Services                  DME Agency: NA       HH Arranged: PT,OT,Nurse's Aide,Social Work [place]: [place] Date [place]: 05/24/20 Time [place]: 1319 Representative spoke with at [place]: [place] ([place]) Interventions     Readmission Risk Interventions Readmission Risk Prevention Plan 04/11/2019  Transportation Screening Complete  PCP or Specialist Appt within 3-5 Days Complete  HRI or [place] Complete  Social Work Consult for [place] Planning/Counseling Complete  Palliative Care Screening Not  Applicable  Medication Review Press photographer) Complete  Some recent data might be hidden

## 2020-05-24 NOTE — Progress Notes (Signed)
RN went over discharge paperwork with pt. NT removed PIV. Pt belongings with pt and family. Pt's daughter to transport pt home. NT transported pt to private vehicle.

## 2020-05-24 NOTE — TOC Progression Note (Signed)
Transition of Care [facility]) - Progression Note    Patient Details  Name: [name] MRN: [id number] Date of Birth: 1932/09/07  Transition of Care [facility]) CM/SW Contact  Reece Agar, [place] Phone Number: 05/24/2020, 12:56 PM  Clinical Narrative:    12:30pm- CSW contacted pt granddaughter to speak about ALF/ILF resources, CSW provided resources from medicare.gov in DC packet.        Expected Discharge Plan and Services           Expected Discharge Date: 05/24/20                                     Social Determinants of Health (SDOH) Interventions    Readmission Risk Interventions Readmission Risk Prevention Plan 04/11/2019  Transportation Screening Complete  PCP or Specialist Appt within 3-5 Days Complete  HRI or [place] Complete  Social Work Consult for [place] Planning/Counseling Complete  Palliative Care Screening Not Applicable  Medication Review Press photographer) Complete  Some recent data might be hidden

## 2020-05-24 NOTE — Discharge Instructions (Signed)
Dear [name],   Thank you for letting us participate in your care! In this section, you will find a brief hospital admission summary of why you were admitted to the hospital, what happened during your admission, your diagnosis/diagnoses, and recommended follow up.   You were admitted because you were experiencing Shortness of breath.  Your testing revealed acute respiratory hypoxic failure.   You were diagnosed with acute respiratory hypoxic failure .  You were treated with albuterol.   You were also seen by PT/OT. They recommended Home health PT/OT, health aid and ILF/ALF.   Your symptoms improved and you were discharged from the hospital for meeting this goal.    POST-HOSPITAL & CARE INSTRUCTIONS 1. Follow up with cardiology in 1 week 2. Follow up with your PCP in 1 week 3. Please let PCP/Specialists know of any changes in medications that were made.  4. Please see medications section of this packet for any medication changes.   DOCTOR'S APPOINTMENTS & FOLLOW UP No future appointments. Follow up Cardiology in 1 week. Follow up with PCP in 1 week.  Thank you for choosing [facility]! Take care and be well!  [facility]  [address] [place], [place] [address] [phone]

## 2020-05-24 NOTE — Plan of Care (Signed)
  Problem: Education: Goal: Knowledge of General Education information will improve Description: Including pain rating scale, medication(s)/side effects and non-pharmacologic comfort measures Outcome: Progressing   Problem: Activity: Goal: Risk for activity intolerance will decrease Outcome: Progressing   Problem: Elimination: Goal: Will not experience complications related to urinary retention Outcome: Progressing   Problem: Pain Managment: Goal: General experience of comfort will improve Outcome: Progressing   Problem: Safety: Goal: Ability to remain free from injury will improve Outcome: Progressing   Problem: Skin Integrity: Goal: Risk for impaired skin integrity will decrease Outcome: Progressing   

## 2020-07-13 ENCOUNTER — Encounter (HOSPITAL_COMMUNITY): Payer: Self-pay

## 2020-07-13 ENCOUNTER — Other Ambulatory Visit: Payer: Self-pay

## 2020-07-13 ENCOUNTER — Emergency Department (HOSPITAL_COMMUNITY)
Admission: EM | Admit: 2020-07-13 | Discharge: 2020-07-13 | Disposition: A | Discharge status: Home / Self Care | Payer: Medicare Other | Attending: Emergency Medicine | Admitting: Emergency Medicine

## 2020-07-13 ENCOUNTER — Emergency Department (HOSPITAL_COMMUNITY): Payer: Medicare Other

## 2020-07-13 DIAGNOSIS — N183 Chronic kidney disease, stage 3 unspecified: Secondary | ICD-10-CM | POA: Insufficient documentation

## 2020-07-13 DIAGNOSIS — Z96642 Presence of left artificial hip joint: Secondary | ICD-10-CM | POA: Diagnosis not present

## 2020-07-13 DIAGNOSIS — I5032 Chronic diastolic (congestive) heart failure: Secondary | ICD-10-CM | POA: Diagnosis not present

## 2020-07-13 DIAGNOSIS — I2510 Atherosclerotic heart disease of native coronary artery without angina pectoris: Secondary | ICD-10-CM | POA: Insufficient documentation

## 2020-07-13 DIAGNOSIS — Z79899 Other long term (current) drug therapy: Secondary | ICD-10-CM | POA: Insufficient documentation

## 2020-07-13 DIAGNOSIS — Z20822 Contact with and (suspected) exposure to covid-19: Secondary | ICD-10-CM | POA: Diagnosis not present

## 2020-07-13 DIAGNOSIS — E1122 Type 2 diabetes mellitus with diabetic chronic kidney disease: Secondary | ICD-10-CM | POA: Insufficient documentation

## 2020-07-13 DIAGNOSIS — Z87891 Personal history of nicotine dependence: Secondary | ICD-10-CM | POA: Insufficient documentation

## 2020-07-13 DIAGNOSIS — Z8673 Personal history of transient ischemic attack (TIA), and cerebral infarction without residual deficits: Secondary | ICD-10-CM | POA: Diagnosis not present

## 2020-07-13 DIAGNOSIS — I130 Hypertensive heart and chronic kidney disease with heart failure and stage 1 through stage 4 chronic kidney disease, or unspecified chronic kidney disease: Secondary | ICD-10-CM | POA: Diagnosis not present

## 2020-07-13 DIAGNOSIS — R55 Syncope and collapse: Secondary | ICD-10-CM | POA: Diagnosis not present

## 2020-07-13 DIAGNOSIS — E860 Dehydration: Secondary | ICD-10-CM | POA: Insufficient documentation

## 2020-07-13 DIAGNOSIS — Z7902 Long term (current) use of antithrombotics/antiplatelets: Secondary | ICD-10-CM | POA: Insufficient documentation

## 2020-07-13 LAB — URINALYSIS, ROUTINE W REFLEX MICROSCOPIC
Bilirubin Urine: NEGATIVE
Glucose, UA: NEGATIVE mg/dL
Hgb urine dipstick: NEGATIVE
Ketones, ur: NEGATIVE mg/dL
Nitrite: NEGATIVE
Protein, ur: >=300 mg/dL — AB
Specific Gravity, Urine: 1.018 (ref 1.005–1.030)
WBC, UA: >50 WBC/hpf — ABNORMAL HIGH (ref 0–5)
pH: 5 (ref 5.0–8.0)

## 2020-07-13 LAB — COMPREHENSIVE METABOLIC PANEL
ALT: 18 U/L (ref 0–44)
AST: 41 U/L (ref 15–41)
Albumin: 3.3 g/dL — ABNORMAL LOW (ref 3.5–5.0)
Alkaline Phosphatase: 43 U/L (ref 38–126)
Anion gap: 8 (ref 5–15)
BUN: 28 mg/dL — ABNORMAL HIGH (ref 8–23)
CO2: 28 mmol/L (ref 22–32)
Calcium: 9.3 mg/dL (ref 8.9–10.3)
Chloride: 104 mmol/L (ref 98–111)
Creatinine, Ser: 2.02 mg/dL — ABNORMAL HIGH (ref 0.44–1.00)
GFR, Estimated: 24 mL/min — ABNORMAL LOW (ref >60)
Glucose, Bld: 119 mg/dL — ABNORMAL HIGH (ref 70–99)
Potassium: 4 mmol/L (ref 3.5–5.1)
Sodium: 140 mmol/L (ref 135–145)
Total Bilirubin: 0.5 mg/dL (ref 0.3–1.2)
Total Protein: 7 g/dL (ref 6.5–8.1)

## 2020-07-13 LAB — TROPONIN I (HIGH SENSITIVITY)
Troponin I (High Sensitivity): 51 ng/L — ABNORMAL HIGH (ref <18)
Troponin I (High Sensitivity): 50 ng/L — ABNORMAL HIGH (ref <18)

## 2020-07-13 LAB — CBC WITH DIFFERENTIAL/PLATELET
Abs Immature Granulocytes: 0.02 10*3/uL (ref 0.00–0.07)
Basophils Absolute: 0.1 10*3/uL (ref 0.0–0.1)
Basophils Relative: 1 %
Eosinophils Absolute: 0.1 10*3/uL (ref 0.0–0.5)
Eosinophils Relative: 1 %
HCT: 36.4 % (ref 36.0–46.0)
Hemoglobin: 11.3 g/dL — ABNORMAL LOW (ref 12.0–15.0)
Immature Granulocytes: 0 %
Lymphocytes Relative: 25 %
Lymphs Abs: 1.7 10*3/uL (ref 0.7–4.0)
MCH: 28.3 pg (ref 26.0–34.0)
MCHC: 31 g/dL (ref 30.0–36.0)
MCV: 91 fL (ref 80.0–100.0)
Monocytes Absolute: 0.5 10*3/uL (ref 0.1–1.0)
Monocytes Relative: 7 %
Neutro Abs: 4.5 10*3/uL (ref 1.7–7.7)
Neutrophils Relative %: 66 %
Platelets: 267 10*3/uL (ref 150–400)
RBC: 4 MIL/uL (ref 3.87–5.11)
RDW: 15.7 % — ABNORMAL HIGH (ref 11.5–15.5)
WBC: 6.9 10*3/uL (ref 4.0–10.5)
nRBC: 0 % (ref 0.0–0.2)

## 2020-07-13 LAB — RESP PANEL BY RT-PCR (FLU A&B, COVID) ARPGX2
Influenza A by PCR: NEGATIVE
Influenza B by PCR: NEGATIVE
SARS Coronavirus 2 by RT PCR: NEGATIVE

## 2020-07-13 LAB — BRAIN NATRIURETIC PEPTIDE: B Natriuretic Peptide: 1503.3 pg/mL — ABNORMAL HIGH (ref 0.0–100.0)

## 2020-07-13 MED ORDER — SODIUM CHLORIDE 0.9 % IV BOLUS
500.0000 mL | Freq: Once | INTRAVENOUS | Status: AC
  Administered 2020-07-13: 500 mL via INTRAVENOUS

## 2020-07-13 NOTE — ED Provider Notes (Signed)
[facility] EMERGENCY DEPARTMENT Provider Note   CSN: [id number] Arrival date & time: 07/13/20  1429     History Chief Complaint  Patient presents with   Loss of Consciousness    [name] is a 87 y.o. female.   Loss of Consciousness Associated symptoms: no shortness of breath   Patient presents after syncopal episode.  States she was at [employer] and feeling lightheaded.  States she not been eating much.  Then felt lightheaded and passed out.  States she has not been feeling well last couple days.  No chest pain.  Not having much oral intake.  No fevers or chills.  No cough.  No diarrhea.  No numbness or weakness.    Past Medical History:  Diagnosis Date   Anemia 04/03/2011   Anginal pain ([place])    a. 02/2014 MV: Low risk; b. 05/2017 MV: small area of inferior basal ischemia. EF 54% - low risk-->Med mgmt.   Anxiety    Aortic valve sclerosis w/ systolic murmur    a. 54/6270 AoV sclerosis w/o stenosis.   Arthritis    "feet" (06/02/2017)   Blind left eye 1999   CKD (chronic kidney disease) stage 3, GFR 30-59 ml/min (HCC)    Depression    Diastolic dysfunction    a. 10/2018 Echo: EF 60-65%, mild septal/posterior LVH. Impaired relaxation. Nl RV size/fxn. Mod dil LA. Trace TR. Triv AI. Mod AoV sclerosis w/o stenosis. Nl PASP.   Exertional dyspnea    GERD (gastroesophageal reflux disease)    High cholesterol    Hip fracture ([place]) 07/17/11   fall from 1-2 feet; left   Hypertension    Ischemic stroke ([place]) 05/2017   a. 06/2017 Holter: RSR, PACs, no afib.   Prosthetic eye globe 1999   "from diabetic; left eye"   Type II diabetes mellitus ([place])    not taking any medication (06/02/2017)    Patient Active Problem List   Diagnosis Date Noted   Acute exacerbation of congestive heart failure ([place]) 05/23/2020   Shortness of breath 05/21/2020   Acute hypoxemic respiratory failure ([place]) 11/24/2019   Hypoxia 11/23/2019   Hemiparesis due to old stroke ([place])  05/12/2019   Tachycardia    Coronary artery disease involving native coronary artery of native heart without angina pectoris    Unstable angina ([place]) 04/08/2019   HTN (hypertension) 04/07/2019   HLD (hyperlipidemia) 04/07/2019   Elevated troponin 04/07/2019   AKI (acute kidney injury) ([place]) 11/01/2018   Leukocytosis 02/22/2018   Influenza A 02/21/2018   Wheezy bronchitis 02/21/2018   Acute bronchitis 02/21/2018   Hypertension associated with stage 3 chronic kidney disease due to type 2 diabetes mellitus ([place]) 06/09/2017   Dyslipidemia associated with type 2 diabetes mellitus ([place]) 06/09/2017   Stage 3 chronic kidney disease due to type 2 diabetes mellitus ([place]) 06/09/2017   Peripheral sensory neuropathy due to type 2 diabetes mellitus ([place]) 06/09/2017   GERD without esophagitis 06/09/2017   Glaucoma due to type 2 diabetes mellitus ([place]) 06/09/2017   Alcohol abuse 06/04/2017   Ischemic stroke ([place]) 06/02/2017   Type 2 diabetes mellitus with vascular disease ([place]) 06/02/2017   Chest pain 06/02/2017   Closed non-physeal fracture of metatarsal bone of right foot 07/31/2016   Hypertensive urgency 02/17/2014   Chronic diastolic heart failure ([place]) 03/15/2013   Closed left hip fracture ([place]) 07/17/2011    Past Surgical History:  Procedure Laterality Date   ENUCLEATION Left    FRACTURE SURGERY  HIP ARTHROPLASTY  07/18/2011   Procedure: ARTHROPLASTY BIPOLAR HIP;  Surgeon: Nita Sells, MD;  Location: [facility];  Service: Orthopedics;  Laterality: Left;   INTRAOCULAR PROSTHESES INSERTION  1999   left   LEFT HEART CATH AND CORS/GRAFTS ANGIOGRAPHY N/A 04/08/2019   Procedure: LEFT HEART CATH AND CORS/GRAFTS ANGIOGRAPHY;  Surgeon: Minna Merritts, MD;  Location: [facility];  Service: Cardiovascular;  Laterality: N/A;   Annex   "cancer"   TUBAL LIGATION  1970's     OB History   No obstetric history on file.     Family History  Problem Relation Age of  Onset   Diabetes type II Mother    Diabetes type II Father    Diabetes type II Sister    Colon cancer Neg Hx     Social History   Tobacco Use   Smoking status: Former    Packs/day: 1.50    Years: 47.00    Pack years: 70.50    Types: Cigarettes    Quit date: 09/13/1997    Years since quitting: 22.8   Smokeless tobacco: Never  Vaping Use   Vaping Use: Never used  Substance Use Topics   Alcohol use: Not Currently    Alcohol/week: 17.0 standard drinks    Types: 32 Shots of liquor per week    Comment: Quit 03/2018   Drug use: No    Types: Marijuana, Heroin    Comment: 06/02/2017  "last drug use was in the 1980s." Prev injected heroin.    Home Medications Prior to Admission medications   Medication Sig Start Date End Date Taking? Authorizing Provider  acetaminophen (TYLENOL) 325 MG tablet Take 2 tablets (650 mg total) by mouth every 6 (six) hours as needed for mild pain or fever (or temp > 37.5 C (99.5 F)). 06/05/17   Elgergawy, Silver Huguenin, MD  albuterol (VENTOLIN HFA) 108 (90 Base) MCG/ACT inhaler Inhale 2 puffs into the lungs every 6 (six) hours as needed for wheezing or shortness of breath.    [provider]  amLODipine (NORVASC) 10 MG tablet Take 1 tablet (10 mg total) by mouth daily. 06/09/19 11/24/19  Darliss Cheney, MD  atorvastatin (LIPITOR) 40 MG tablet Take 1 tablet (40 mg total) by mouth daily at 6 PM. Patient taking differently: Take 40 mg by mouth at bedtime. 06/08/19   Darliss Cheney, MD  carvedilol (COREG) 12.5 MG tablet Take 1 tablet (12.5 mg total) by mouth 2 (two) times daily with a meal. 06/08/19 11/24/19  Darliss Cheney, MD  cetirizine (ZYRTEC) 10 MG tablet Take 10 mg by mouth daily as needed for allergies or rhinitis.    [provider]  clopidogrel (PLAVIX) 75 MG tablet Take 75 mg by mouth daily.    [provider]  Cobalamin Combinations (NEURIVA PLUS) CAPS Take 1 capsule by mouth daily with breakfast.    [provider]  fluticasone  (FLONASE) 50 MCG/ACT nasal spray Place 1-2 sprays into both nostrils daily as needed for allergies or rhinitis.    [provider]  folic acid (FOLVITE) 1 MG tablet Take 1 tablet (1 mg total) by mouth daily. Patient not taking: Reported on 05/21/2020 06/06/17   Elgergawy, Silver Huguenin, MD  gabapentin (NEURONTIN) 300 MG capsule Take 1 capsule (300 mg total) by mouth daily. 05/25/20   Armando Reichert, MD  guaiFENesin (MUCINEX) 600 MG 12 hr tablet Take 1 tablet (600 mg total) by mouth 2 (two) times daily as needed for  to loosen phlegm. 05/24/20   Armando Reichert, MD  hydrALAZINE (APRESOLINE) 10 MG tablet TAKE 1 TABLET(10 MG) BY MOUTH THREE TIMES DAILY Patient taking differently: Take 10 mg by mouth 3 (three) times daily. 08/01/19   Belva Crome, MD  isosorbide mononitrate (IMDUR) 60 MG 24 hr tablet Take 1 tablet (60 mg total) by mouth daily. 05/24/20   Armando Reichert, MD  latanoprost (XALATAN) 0.005 % ophthalmic solution Place 1 drop into the right eye at bedtime. 05/07/20   [provider]  Multiple Vitamins-Minerals (ONE-A-DAY WOMENS 50+) TABS Take 1 tablet by mouth daily with breakfast.    [provider]  neomycin-polymyxin-hydrocortisone (CORTISPORIN) 3.5-10000-1 OTIC suspension Place 2-3 drops into the right ear at bedtime.    [provider]  nicotine (NICODERM CQ - DOSED IN MG/24 HR) 7 mg/24hr patch Place 1 patch (7 mg total) onto the skin daily. 05/25/20   Armando Reichert, MD  nitroGLYCERIN (NITROSTAT) 0.4 MG SL tablet Place 1 tablet (0.4 mg total) under the tongue every 5 (five) minutes as needed for chest pain. 02/11/19 11/24/19  Isaiah Serge, NP  omeprazole (PRILOSEC) 20 MG capsule TAKE 1 CAPSULE(20 MG) BY MOUTH DAILY Patient taking differently: Take 20 mg by mouth daily before breakfast. 12/15/19   Isaiah Serge, NP    Allergies    Tape  Review of Systems   Review of Systems  Constitutional:  Positive for appetite change.  Respiratory:  Negative for shortness of  breath.   Cardiovascular:  Positive for syncope.  Gastrointestinal:  Negative for abdominal pain.  Genitourinary:  Negative for flank pain.  Musculoskeletal:  Negative for back pain.  Skin:  Negative for rash.  Neurological:  Positive for syncope and light-headedness.   Physical Exam Updated Vital Signs BP (!) 165/72   Pulse 74   Temp 98 F (36.7 C) (Oral)   Resp (!) 25   Ht 5' (1.524 m)   Wt 56.6 kg   SpO2 97%   BMI 24.37 kg/m   Physical Exam Vitals and nursing note reviewed.  HENT:     Head: Atraumatic.  Cardiovascular:     Rate and Rhythm: Regular rhythm.  Pulmonary:     Breath sounds: No wheezing or rhonchi.  Abdominal:     Tenderness: There is no abdominal tenderness.  Musculoskeletal:        General: No tenderness.     Cervical back: Neck supple.  Skin:    General: Skin is warm.     Capillary Refill: Capillary refill takes less than 2 seconds.  Neurological:     Mental Status: She is alert and oriented to person, place, and time. Mental status is at baseline.    ED Results / Procedures / Treatments   Labs (all labs ordered are listed, but only abnormal results are displayed) Labs Reviewed  CBC WITH DIFFERENTIAL/PLATELET - Abnormal; Notable for the following components:      Result Value   Hemoglobin 11.3 (*)    RDW 15.7 (*)    All other components within normal limits  COMPREHENSIVE METABOLIC PANEL - Abnormal; Notable for the following components:   Glucose, Bld 119 (*)    BUN 28 (*)    Creatinine, Ser 2.02 (*)    Albumin 3.3 (*)    GFR, Estimated 24 (*)    All other components within normal limits  BRAIN NATRIURETIC PEPTIDE - Abnormal; Notable for the following components:   B Natriuretic Peptide 1,503.3 (*)    All other components  within normal limits  URINALYSIS, ROUTINE W REFLEX MICROSCOPIC - Abnormal; Notable for the following components:   Color, Urine AMBER (*)    APPearance CLOUDY (*)    Protein, ur >=300 (*)    Leukocytes,Ua MODERATE (*)     WBC, UA >50 (*)    Bacteria, UA FEW (*)    All other components within normal limits  TROPONIN I (HIGH SENSITIVITY) - Abnormal; Notable for the following components:   Troponin I (High Sensitivity) 51 (*)    All other components within normal limits  TROPONIN I (HIGH SENSITIVITY) - Abnormal; Notable for the following components:   Troponin I (High Sensitivity) 50 (*)    All other components within normal limits  RESP PANEL BY RT-PCR (FLU A&B, COVID) [id number]    EKG EKG Interpretation  Date/Time:  Friday July 13 2020 18:34:03 EDT Ventricular Rate:  74 PR Interval:  168 QRS Duration: 96 QT Interval:  406 QTC Calculation: 451 R Axis:   2 Text Interpretation: Sinus rhythm Probable left atrial enlargement LVH with secondary repolarization abnormality Confirmed by Davonna Belling [phone]) on 07/13/2020 9:14:27 PM  Radiology DG Chest Port 1 View  Result Date: 07/13/2020 CLINICAL DATA:  Syncope. EXAM: PORTABLE CHEST 1 VIEW COMPARISON:  May 21, 2020 FINDINGS: Low lung volumes are seen. Mild, diffuse, chronic appearing increased lung markings are noted. There is no evidence of focal consolidation, pleural effusion or pneumothorax. The cardiac silhouette is moderately enlarged. Degenerative changes seen throughout the thoracic spine. IMPRESSION: Low lung volumes without acute or active cardiopulmonary disease. Electronically Signed   By: Virgina Norfolk M.D.   On: 07/13/2020 15:28    Procedures Procedures   Medications Ordered in ED Medications  sodium chloride 0.9 % bolus 500 mL (0 mLs Intravenous Stopped 07/13/20 2137)    ED Course  I have reviewed the triage vital signs and the nursing notes.  Pertinent labs & imaging results that were available during my care of the patient were reviewed by me and considered in my medical decision making (see chart for details).    MDM Rules/Calculators/A&P                          Patient presents after syncopal episode.  Happened after not  eating much over the last few days and being at [employer].  States feeling better now after some fluids.  Has had a little bit of cough without production.  Chest x-ray reassuring.  Did have one mild episode of hypoxia that resolved on its own.  Has been ambulated without oxygen and sats did not drop into the 80s.  Fluid bolus been given and feels better.  Lab work stable to what it was before although BNP is more elevated.  I do not think she is volume overloaded at this time however.  Patient feels better and is eager to go home.  I think there is a risk to her going home but I think it is probably best for her at this time.  Can follow-up with PCP as an outpatient.  Will discharge home Final Clinical Impression(s) / ED Diagnoses Final diagnoses:  Syncope, unspecified syncope type  Dehydration    Rx / DC Orders ED Discharge Orders     None        Davonna Belling, MD 07/14/20 [phone]

## 2020-07-13 NOTE — Discharge Instructions (Addendum)
Follow-up with your doctor for an appointment as soon as possible.  Try and eat a little bit more.

## 2020-07-13 NOTE — ED Notes (Signed)
E-signature pad unavailable at time of pt discharge. This RN discussed discharge materials with pt and answered all pt questions. Pt stated understanding of discharge material. ? ?

## 2020-07-13 NOTE — ED Triage Notes (Signed)
Was shopping in [employer] got dizzy had  an unwitnessed syncopal episode was brought back into [employer] for ems called c/o of dizziness N/V during the week

## 2020-07-13 NOTE — ED Notes (Signed)
AMBULATED PT WITH 91 BEING LOWEST FOR OSTAT

## 2020-07-13 NOTE — ED Provider Notes (Signed)
Emergency Medicine Provider Triage Evaluation Note  [name] , a 87 y.o. female  was evaluated in triage.  Pt complains of syncope and feeling dizzy while sitting outside at [employer].  Staff saw her slumped over after checking out and sitting on a bench.  Brief LOC.  Review of Systems  Positive: Syncope, cough Negative: Fever, chest pain or abd pain  Physical Exam  Ht 5' (1.524 m)   Wt 56.6 kg   SpO2 97%   BMI 24.37 kg/m  Gen:   Awake, no distress   Resp:  Normal effort persistent cough MSK:   Moves extremities without difficulty  Other:  No lower ext edema  Medical Decision Making  Medically screening exam initiated at 2:37 PM.  Appropriate orders placed.  [name] was informed that the remainder of the evaluation will be completed by another provider, this initial triage assessment does not replace that evaluation, and the importance of remaining in the ED until their evaluation is complete.     Blanchie Dessert, MD 07/13/20 1438

## 2020-07-20 ENCOUNTER — Telehealth: Payer: Self-pay

## 2020-07-20 NOTE — Telephone Encounter (Signed)
Spoke with patient's granddaughter [name] and scheduled an in-person Palliative Consult for 08/15/20 @ 9AM.   COVID screening was negative. No pets in home. Patient lives alone.  Consent obtained; updated Outlook/Netsmart/Team List and Epic.   Family is aware they may be receiving a call from NP the day before or day of to confirm appointment.

## 2020-07-26 ENCOUNTER — Other Ambulatory Visit (HOSPITAL_COMMUNITY): Payer: Self-pay

## 2020-07-26 DIAGNOSIS — R1310 Dysphagia, unspecified: Secondary | ICD-10-CM

## 2020-07-30 ENCOUNTER — Other Ambulatory Visit: Payer: Self-pay | Admitting: Student

## 2020-07-30 DIAGNOSIS — Z780 Asymptomatic menopausal state: Secondary | ICD-10-CM

## 2020-08-03 ENCOUNTER — Other Ambulatory Visit: Payer: Self-pay

## 2020-08-03 ENCOUNTER — Ambulatory Visit (HOSPITAL_COMMUNITY): Admission: RE | Admit: 2020-08-03 | Payer: Medicare Other | Source: Ambulatory Visit

## 2020-08-03 ENCOUNTER — Encounter (HOSPITAL_COMMUNITY): Payer: Self-pay

## 2020-08-06 ENCOUNTER — Ambulatory Visit (HOSPITAL_COMMUNITY): Payer: Medicare Other

## 2020-08-06 ENCOUNTER — Encounter (HOSPITAL_COMMUNITY): Payer: Medicare Other

## 2020-08-06 ENCOUNTER — Encounter (HOSPITAL_COMMUNITY): Payer: Self-pay

## 2020-08-07 ENCOUNTER — Telehealth (HOSPITAL_COMMUNITY): Payer: Self-pay

## 2020-08-07 NOTE — Telephone Encounter (Signed)
Attempted to contact patient to reschedule OP MBS - called could not be completed.

## 2020-08-15 ENCOUNTER — Other Ambulatory Visit: Payer: Self-pay | Admitting: *Deleted

## 2020-08-15 ENCOUNTER — Telehealth (HOSPITAL_COMMUNITY): Payer: Self-pay

## 2020-08-15 ENCOUNTER — Other Ambulatory Visit: Payer: Medicare Other | Admitting: Hospice

## 2020-08-15 ENCOUNTER — Other Ambulatory Visit: Payer: Self-pay

## 2020-08-15 ENCOUNTER — Telehealth: Payer: Self-pay

## 2020-08-15 DIAGNOSIS — Z515 Encounter for palliative care: Secondary | ICD-10-CM

## 2020-08-15 DIAGNOSIS — E1122 Type 2 diabetes mellitus with diabetic chronic kidney disease: Secondary | ICD-10-CM

## 2020-08-15 DIAGNOSIS — N183 Chronic kidney disease, stage 3 unspecified: Secondary | ICD-10-CM

## 2020-08-15 DIAGNOSIS — R1310 Dysphagia, unspecified: Secondary | ICD-10-CM

## 2020-08-15 DIAGNOSIS — I5032 Chronic diastolic (congestive) heart failure: Secondary | ICD-10-CM

## 2020-08-15 MED ORDER — OMEPRAZOLE 20 MG PO CPDR: 20.0000 mg | DELAYED_RELEASE_CAPSULE | Freq: Every day | ORAL | 0 refills | Status: DC

## 2020-08-15 NOTE — Telephone Encounter (Signed)
2nd attempt to contact patient to reschedule OP MBS - voicemail is full.

## 2020-08-15 NOTE — Progress Notes (Signed)
[facility] Consult Note Telephone: [phone]  Fax: [phone]  PATIENT NAME: [name] [address] [place] [address] [phone] (home)  DOB: 08/23/1932 MRN: [id number]  PRIMARY CARE PROVIDER:    Dierdre Harness, [place],  [place] [address] [phone]  REFERRING PROVIDER:   Dierdre Harness, [place],  [place] [address] [place]:   Self Emergency contact: [place]     Name Relation Home Work Mobile   [place],Tynisa Granddaughter   [place]   [phone]        I met face to face with patient at home. Palliative Care was asked to follow this patient by consultation request of  Dierdre Harness, FNP to address advance care planning, complex medical decision making and goals of care clarification. Chad Cordial is home with patient during visit. This is the initial visit.    ASSESSMENT AND / RECOMMENDATIONS:   Advance Care Planning: Our advance care planning conversation included a discussion about:    The value and importance of advance care planning  Difference between Hospice and Palliative care Exploration of goals of care in the event of a sudden injury or illness  Identification and preparation of a healthcare agent  Review and updating or creation of an  advance directive document . Decision not to resuscitate or to de-escalate disease focused treatments due to poor prognosis.  CODE STATUS:Patient is a Do Not Resuscitate  Goals of Care: Goals include to maximize quality of life and symptom management  I spent  46 minutes providing this initial consultation. More than 50% of the time in this consultation was spent on counseling patient and coordinating  communication. --------------------------------------------------------------------------------------------------------------------------------------  Symptom Management/Plan: Syncope: No syncope since ED visit for syncope 07/13/2020. Education on staying hydrated, slow position changes.  Dysphagia: related to throat cancer for which she completed chemo/radiation. She reports throat cancer is in remission. Encouraged small bites with sips, soft foods. Choking precautions Type 2 DM: Managed with diet. A1c 05/22/2020 is 5.5. No concentrated sweets. Repeat A1c in 3 months. Routine CBC BMP. CHF: Lasix, Carvedilol, Amlodipine. Reduced salt.  Discussed use of pill box to help with medication adherence. Chad Cordial will help patient set up the pill box.  Follow up: Palliative care will continue to follow for complex medical decision making, advance care planning, and clarification of goals. Return 6 weeks or prn.Encouraged to call provider sooner with any concerns.   Family /Caregiver/Community Supports: Patient lives home alone; [facility] visits her often to help with errands. Plan is to get AL placement. [facility] SW contacted to reach out to patient/[place] for resources.   HOSPICE ELIGIBILITY/DIAGNOSIS: TBD  Chief Complaint: Initial Palliative care visit  HISTORY OF PRESENT ILLNESS:  [name] is a 87 y.o. year old female  with multiple medical conditions including syncope last month at a shopping center for which she went to the ED where received IV fluids and discharged home same day. She reports no dizziness or syncope since last episode last month. She denies pain/discomfort, no respiratory distress. History of Type 2 DM, CAD, dysphagia, diastolic CHF.  History obtained from review of EMR, discussion with primary team, caregiver, family and/or Ms. Cohea.  Review and summarization of Epic records shows history from other than patient. Rest of 10 point ROS asked and negative.     Review of lab  tests/diagnostics   Results for KIA, VARNADORE (MRN [id number]) as  of 08/15/2020 09:07  Ref. Range 07/13/2020 14:37  Sodium Latest Ref Range: 135 - 145 mmol/L 140  Potassium Latest Ref Range: 3.5 - 5.1 mmol/L 4.0  Chloride Latest Ref Range: 98 - 111 mmol/L 104  CO2 Latest Ref Range: 22 - 32 mmol/L 28  Glucose Latest Ref Range: 70 - 99 mg/dL 119 (H)  BUN Latest Ref Range: 8 - 23 mg/dL 28 (H)  Creatinine Latest Ref Range: 0.44 - 1.00 mg/dL 2.02 (H)  Calcium Latest Ref Range: 8.9 - 10.3 mg/dL 9.3  Anion gap Latest Ref Range: 5 - 15  8  Results for ARCHER, VISE (MRN [id number]) as of 08/15/2020 09:07  Ref. Range 07/13/2020 14:37  WBC Latest Ref Range: 4.0 - 10.5 K/uL 6.9  RBC Latest Ref Range: 3.87 - 5.11 MIL/uL 4.00  Hemoglobin Latest Ref Range: 12.0 - 15.0 g/dL 11.3 (L)  HCT Latest Ref Range: 36.0 - 46.0 % 36.4  MCV Latest Ref Range: 80.0 - 100.0 fL 91.0  MCH Latest Ref Range: 26.0 - 34.0 pg 28.3  MCHC Latest Ref Range: 30.0 - 36.0 g/dL 31.0  RDW Latest Ref Range: 11.5 - 15.5 % 15.7 (H)  Platelets Latest Ref Range: 150 - 400 K/uL 267    ROS General: NAD EYES: denies vision changes ENMT: endorses dysphagia Cardiovascular: denies chest pain/discomfort Pulmonary: denies cough, denies SOB Abdomen: endorses good appetite, denies constipation/diarrhea GU: denies dysuria, urinary frequency MSK:  endorses weakness,  no falls/syncope reported Skin: denies rashes or wounds Neurological: denies pain, denies insomnia Psych: Endorses positive mood Heme/lymph/immuno: denies bruises, abnormal bleeding  Physical Exam: 5' (1.524 m)   Wt 56.6 kg   SpO2 97%   BMI 24.37 kg/m  Constitutional: NAD General: Well groomed, cooperative EYES: anicteric sclera, lids intact, no discharge  ENMT: Moist mucous membrane CV: S1 S2, RRR, no LE edema Pulmonary: LCTA, no increased work of breathing, no cough, Abdomen: active BS + 4 quadrants, soft and non tender GU: no suprapubic tenderness MSK:  ambulatory with and without assistive device - rolling walker Skin: warm and dry, no rashes or wounds on visible skin Neuro:  weakness, otherwise non focal Psych: non-anxious affect Hem/lymph/immuno: no widespread bruising   PAST MEDICAL HISTORY:  Active Ambulatory Problems    Diagnosis Date Noted   Closed left hip fracture (HCC) 07/17/2011   Chronic diastolic heart failure ([place]) 03/15/2013   Hypertensive urgency 02/17/2014   Closed non-physeal fracture of metatarsal bone of right foot 07/31/2016   Ischemic stroke ([place]) 06/02/2017   Type 2 diabetes mellitus with vascular disease ([place]) 06/02/2017   Chest pain 06/02/2017   Alcohol abuse 06/04/2017   Hypertension associated with stage 3 chronic kidney disease due to type 2 diabetes mellitus ([place]) 06/09/2017   Dyslipidemia associated with type 2 diabetes mellitus ([place]) 06/09/2017   Stage 3 chronic kidney disease due to type 2 diabetes mellitus ([place]) 06/09/2017   Peripheral sensory neuropathy due to type 2 diabetes mellitus ([place]) 06/09/2017   GERD without esophagitis 06/09/2017   Glaucoma due to type 2 diabetes mellitus ([place]) 06/09/2017   Influenza A 02/21/2018   Wheezy bronchitis 02/21/2018   Acute bronchitis 02/21/2018   Leukocytosis 02/22/2018   AKI (acute kidney injury) ([place]) 11/01/2018   HTN (hypertension) 04/07/2019   HLD (hyperlipidemia) 04/07/2019   Elevated troponin 04/07/2019   Unstable angina ([place]) 04/08/2019   Tachycardia    Coronary artery disease involving native coronary artery of native heart without angina pectoris    Hemiparesis due to old stroke ([place]) 05/12/2019  Hypoxia 11/23/2019   Acute hypoxemic respiratory failure ([place]) 11/24/2019   Shortness of breath 05/21/2020   Acute exacerbation of congestive heart failure ([place]) 05/23/2020   Resolved Ambulatory Problems    Diagnosis Date Noted   Anemia 04/03/2011   HTN (hypertension) 07/17/2011   DM2 (diabetes mellitus, type 2) ([place]) 07/17/2011   Hyperlipidemia  07/17/2011   Anemia associated with acute blood loss 07/19/2011   UTI (lower urinary tract infection) 07/21/2011   Bacteremia 07/22/2011   Chest tightness 02/17/2014   Chest discomfort 03/17/2014   Hip swelling 01/17/2015   Fever, unspecified 01/18/2015   CAP (community acquired pneumonia)    CKD (chronic kidney disease) stage 2, GFR 60-89 ml/min 06/02/2017   Past Medical History:  Diagnosis Date   Anginal pain ([place])    Anxiety    Aortic valve sclerosis w/ systolic murmur    Arthritis    Blind left eye 1999   CKD (chronic kidney disease) stage 3, GFR 30-59 ml/min (HCC)    Depression    Diastolic dysfunction    Exertional dyspnea    GERD (gastroesophageal reflux disease)    High cholesterol    Hip fracture ([place]) 07/17/11   Hypertension    Prosthetic eye globe 1999   Type II diabetes mellitus ([place])     SOCIAL HX:  Social History   Tobacco Use   Smoking status: Former    Packs/day: 1.50    Years: 47.00    Pack years: 70.50    Types: Cigarettes    Quit date: 09/13/1997    Years since quitting: 22.9   Smokeless tobacco: Never  Substance Use Topics   Alcohol use: Not Currently    Alcohol/week: 17.0 standard drinks    Types: 62 Shots of liquor per week    Comment: Quit 03/2018     FAMILY HX:  Family History  Problem Relation Age of Onset   Diabetes type II Mother    Diabetes type II Father    Diabetes type II Sister    Colon cancer Neg Hx       ALLERGIES:  Allergies  Allergen Reactions   Tape Other (See Comments)    SKIN IS SENSITIVE      PERTINENT MEDICATIONS:  Outpatient Encounter Medications as of 08/15/2020  Medication Sig   acetaminophen (TYLENOL) 325 MG tablet Take 2 tablets (650 mg total) by mouth every 6 (six) hours as needed for mild pain or fever (or temp > 37.5 C (99.5 F)).   albuterol (VENTOLIN HFA) 108 (90 Base) MCG/ACT inhaler Inhale 2 puffs into the lungs every 6 (six) hours as needed for wheezing or shortness of breath.   amLODipine (NORVASC) 10  MG tablet Take 1 tablet (10 mg total) by mouth daily.   atorvastatin (LIPITOR) 40 MG tablet Take 1 tablet (40 mg total) by mouth daily at 6 PM. (Patient taking differently: Take 40 mg by mouth at bedtime.)   carvedilol (COREG) 12.5 MG tablet Take 1 tablet (12.5 mg total) by mouth 2 (two) times daily with a meal.   cetirizine (ZYRTEC) 10 MG tablet Take 10 mg by mouth daily as needed for allergies or rhinitis.   clopidogrel (PLAVIX) 75 MG tablet Take 75 mg by mouth daily.   Cobalamin Combinations (NEURIVA PLUS) CAPS Take 1 capsule by mouth daily with breakfast.   fluticasone (FLONASE) 50 MCG/ACT nasal spray Place 1-2 sprays into both nostrils daily as needed for allergies or rhinitis.   folic acid (FOLVITE) 1 MG tablet Take 1 tablet (  1 mg total) by mouth daily. (Patient not taking: Reported on 05/21/2020)   gabapentin (NEURONTIN) 300 MG capsule Take 1 capsule (300 mg total) by mouth daily.   guaiFENesin (MUCINEX) 600 MG 12 hr tablet Take 1 tablet (600 mg total) by mouth 2 (two) times daily as needed for to loosen phlegm.   hydrALAZINE (APRESOLINE) 10 MG tablet TAKE 1 TABLET(10 MG) BY MOUTH THREE TIMES DAILY (Patient taking differently: Take 10 mg by mouth 3 (three) times daily.)   isosorbide mononitrate (IMDUR) 60 MG 24 hr tablet Take 1 tablet (60 mg total) by mouth daily.   latanoprost (XALATAN) 0.005 % ophthalmic solution Place 1 drop into the right eye at bedtime.   Multiple Vitamins-Minerals (ONE-A-DAY WOMENS 50+) TABS Take 1 tablet by mouth daily with breakfast.   neomycin-polymyxin-hydrocortisone (CORTISPORIN) 3.5-10000-1 OTIC suspension Place 2-3 drops into the right ear at bedtime.   nicotine (NICODERM CQ - DOSED IN MG/24 HR) 7 mg/24hr patch Place 1 patch (7 mg total) onto the skin daily.   nitroGLYCERIN (NITROSTAT) 0.4 MG SL tablet Place 1 tablet (0.4 mg total) under the tongue every 5 (five) minutes as needed for chest pain.   omeprazole (PRILOSEC) 20 MG capsule TAKE 1 CAPSULE(20 MG) BY MOUTH  DAILY (Patient taking differently: Take 20 mg by mouth daily before breakfast.)   No facility-administered encounter medications on file as of 08/15/2020.     Thank you for the opportunity to participate in the care of Ms. Geralds.  The palliative care team will continue to follow. Please call our office at [phone] if we can be of additional assistance.   Note: Portions of this note were generated with [employer]ist. Dictation errors may occur despite best attempts at proofreading.  Teodoro Spray, NP

## 2020-08-15 NOTE — Telephone Encounter (Signed)
(  4:07 pm)  Per request of palliative care NP-Liv[place],  SW talked the patient's granddaughter. Patient is scheduled to meet with the [facility] SW tomorrow at 10:30. This SW provided additional education regarding PCS services and process for AL placement. She was grateful for the information and SW encouraged her to call if she had any additional questions/support

## 2020-08-16 ENCOUNTER — Other Ambulatory Visit: Payer: Self-pay

## 2020-08-16 MED ORDER — ISOSORBIDE MONONITRATE ER 60 MG PO TB24: 60.0000 mg | ORAL_TABLET | Freq: Every day | ORAL | 0 refills | Status: DC

## 2020-08-16 NOTE — Telephone Encounter (Signed)
That is correct according to her last two visits to the ER.  Ok to fill.

## 2020-08-20 ENCOUNTER — Other Ambulatory Visit: Payer: Self-pay

## 2020-08-20 MED ORDER — ISOSORBIDE MONONITRATE ER 60 MG PO TB24: 60.0000 mg | ORAL_TABLET | Freq: Every day | ORAL | 0 refills | Status: DC

## 2020-08-28 ENCOUNTER — Encounter (HOSPITAL_COMMUNITY): Payer: Self-pay

## 2020-08-28 ENCOUNTER — Other Ambulatory Visit: Payer: Self-pay

## 2020-08-28 ENCOUNTER — Ambulatory Visit (HOSPITAL_COMMUNITY): Admission: RE | Admit: 2020-08-28 | Payer: Medicare Other | Source: Ambulatory Visit

## 2020-09-04 ENCOUNTER — Other Ambulatory Visit: Payer: Self-pay | Admitting: Cardiology

## 2020-09-05 ENCOUNTER — Encounter (HOSPITAL_COMMUNITY): Payer: Self-pay

## 2020-09-05 ENCOUNTER — Other Ambulatory Visit: Payer: Self-pay

## 2020-09-05 ENCOUNTER — Ambulatory Visit (HOSPITAL_COMMUNITY): Admission: RE | Admit: 2020-09-05 | Payer: Medicare Other | Source: Ambulatory Visit

## 2020-09-05 NOTE — Progress Notes (Deleted)
Cardiology Office Note    Date:  09/05/2020   ID:  [name], DOB 07/10/1932, MRN [id number]   PCP:  Dierdre Harness, [place]  Cardiologist:  Sinclair Grooms, MD  Advanced Practice Provider:  No care team member to display Electrophysiologist:  None   [phone]   No chief complaint on file.   History of Present Illness:  [name] is a 87 y.o. female with history of CAD cardiac cath 03/2019 with chronically occluded RCA medical therapy recommended, SVT, chronic diastolic CHF, CKD, hypertension, normal LVEF 55 to 60% on echo 03/2019, prior CVA.  Patient went to the ED 07/13/20 after syncopal episode while shopping at [employer].  Happened after not eating for several days and she responded to fluid bolus.  BNP was elevated but they did not feel she was fluid overloaded.    Past Medical History:  Diagnosis Date   Anemia 04/03/2011   Anginal pain ([place])    a. 02/2014 MV: Low risk; b. 05/2017 MV: small area of inferior basal ischemia. EF 54% - low risk-->Med mgmt.   Anxiety    Aortic valve sclerosis w/ systolic murmur    a. 17/4944 AoV sclerosis w/o stenosis.   Arthritis    "feet" (06/02/2017)   Blind left eye 1999   CKD (chronic kidney disease) stage 3, GFR 30-59 ml/min (HCC)    Depression    Diastolic dysfunction    a. 10/2018 Echo: EF 60-65%, mild septal/posterior LVH. Impaired relaxation. Nl RV size/fxn. Mod dil LA. Trace TR. Triv AI. Mod AoV sclerosis w/o stenosis. Nl PASP.   Exertional dyspnea    GERD (gastroesophageal reflux disease)    High cholesterol    Hip fracture ([place]) 07/17/11   fall from 1-2 feet; left   Hypertension    Ischemic stroke ([place]) 05/2017   a. 06/2017 Holter: RSR, PACs, no afib.   Prosthetic eye globe 1999   "from diabetic; left eye"   Type II diabetes mellitus ([place])    not taking any medication (06/02/2017)    Past Surgical History:  Procedure Laterality Date   ENUCLEATION Left    FRACTURE SURGERY      HIP ARTHROPLASTY  07/18/2011   Procedure: ARTHROPLASTY BIPOLAR HIP;  Surgeon: Nita Sells, MD;  Location: [facility];  Service: Orthopedics;  Laterality: Left;   INTRAOCULAR PROSTHESES INSERTION  1999   left   LEFT HEART CATH AND CORS/GRAFTS ANGIOGRAPHY N/A 04/08/2019   Procedure: LEFT HEART CATH AND CORS/GRAFTS ANGIOGRAPHY;  Surgeon: Minna Merritts, MD;  Location: [facility];  Service: Cardiovascular;  Laterality: N/A;   [place]   "cancer"   TUBAL LIGATION  1970's    Current Medications: No outpatient medications have been marked as taking for the 09/11/20 encounter (Appointment) with Imogene Burn, PA-C.     Allergies:   Tape   Social History   Socioeconomic History   Marital status: Single    Spouse name: Not on file   Number of children: Not on file   Years of education: Not on file   Highest education level: Not on file  Occupational History   Not on file  Tobacco Use   Smoking status: Former    Packs/day: 1.50    Years: 47.00    Pack years: 70.50    Types: Cigarettes    Quit date: 09/13/1997    Years since quitting: 22.9   Smokeless tobacco: Never  Vaping Use   Vaping Use: Never used  Substance and Sexual Activity   Alcohol use: Not Currently    Alcohol/week: 17.0 standard drinks    Types: 17 Shots of liquor per week    Comment: Quit 03/2018   Drug use: No    Types: Marijuana, Heroin    Comment: 06/02/2017  "last drug use was in the 1980s." Prev injected heroin.   Sexual activity: Not Currently  Other Topics Concern   Not on file  Social History Narrative   Originally from [place] but moved to [facility] @ age 73.  Moved to [place] ~ 2011 to be near grandchildren.   Social Determinants of Health   Financial Resource Strain: Not on file  Food Insecurity: Not on file  Transportation Needs: Not on file  Physical Activity: Not on file  Stress: Not on file  Social Connections: Not on file     Family History:  The patient's  ***family history includes Diabetes type II in her father, mother, and sister.   ROS:   Please see the history of present illness.    ROS All other systems reviewed and are negative.   PHYSICAL EXAM:   VS:  There were no vitals taken for this visit.  Physical Exam  GEN: Well nourished, well developed, in no acute distress  HEENT: normal  Neck: no JVD, carotid bruits, or masses Cardiac:RRR; no murmurs, rubs, or gallops  Respiratory:  clear to auscultation bilaterally, normal work of breathing GI: soft, nontender, nondistended, + BS Ext: without cyanosis, clubbing, or edema, Good distal pulses bilaterally MS: no deformity or atrophy  Skin: warm and dry, no rash Neuro:  Alert and Oriented x 3, Strength and sensation are intact Psych: euthymic mood, full affect  Wt Readings from Last 3 Encounters:  07/13/20 124 lb 12.5 oz (56.6 kg)  05/24/20 124 lb 12.5 oz (56.6 kg)  11/27/19 139 lb 9.6 oz (63.3 kg)      Studies/Labs Reviewed:   EKG:  EKG is*** ordered today.  The ekg ordered today demonstrates ***  Recent Labs: 05/22/2020: TSH 1.470 05/23/2020: Magnesium 2.3 07/13/2020: ALT 18; B Natriuretic Peptide 1,503.3; BUN 28; Creatinine, Ser 2.02; Hemoglobin 11.3; Platelets 267; Potassium 4.0; Sodium 140   Lipid Panel    Component Value Date/Time   CHOL 169 04/08/2019 0437   TRIG 154 (H) 04/08/2019 0437   HDL 47 04/08/2019 0437   CHOLHDL 3.6 04/08/2019 0437   VLDL 31 04/08/2019 0437   LDLCALC 91 04/08/2019 0437    Additional studies/ records that were reviewed today include:  Echo 03/2019 IMPRESSIONS     1. Left ventricular ejection fraction, by estimation, is 55 to 60%. The  left ventricle has normal function. The left ventricle demonstrates  regional wall motion abnormalities (see scoring diagram/findings for  description). There is mild left ventricular   hypertrophy. Left ventricular diastolic parameters are consistent with  Grade I diastolic dysfunction (impaired  relaxation). Elevated left atrial  pressure. There is moderate hypokinesis of the left ventricular, basal  inferior wall and inferolateral wall.   2. Right ventricular systolic function is normal. The right ventricular  size is normal. There is moderately elevated pulmonary artery systolic  pressure.   3. Left atrial size was severely dilated.   4. The mitral valve is degenerative. Mild to moderate mitral valve  regurgitation.   5. Tricuspid valve regurgitation is mild to moderate.   6. The aortic valve was not well visualized. Aortic valve regurgitation  is moderate.  Mild to moderate aortic valve sclerosis/calcification is  present, without any evidence of aortic stenosis.   7. The inferior vena cava is dilated in size with <50% respiratory  variability, suggesting right atrial pressure of 15 mmHg.   8. Agitated saline contrast bubble study was negative, with no evidence  of any interatrial shunt.   FINDINGS   Cath 03/2019 Ost RCA to Mid RCA lesion is 100% stenosed. Prox Cx lesion is 30% stenosed. Mid LAD lesion is 30% stenosed.    Risk Assessment/Calculations:   {Does this patient have ATRIAL FIBRILLATION?:[phone]}     ASSESSMENT:    1. Tachycardia   2. Coronary artery disease involving native coronary artery of native heart without angina pectoris   3. Chronic diastolic CHF (congestive heart failure) ([place])   4. Essential hypertension   5. CKD (chronic kidney disease) stage 4, GFR 15-29 ml/min (HCC)      PLAN:  In order of problems listed above:  SVT  CAD with total RCA medical therapy recommended  Chronic diastolic CHF  Hypertension  CKD  History of syncope 07/2020 felt due to poor oral intake responded to fluid bolus  Shared Decision Making/Informed Consent   {Are you ordering a CV Procedure (e.g. stress test, cath, DCCV, TEE, etc)?   Press F2        :[id number]}    Medication Adjustments/Labs and Tests Ordered: Current medicines are reviewed at  length with the patient today.  Concerns regarding medicines are outlined above.  Medication changes, Labs and Tests ordered today are listed in the Patient Instructions below. There are no Patient Instructions on file for this visit.   Signed, Ermalinda Barrios, PA-C  09/05/2020 2:35 PM    [facility] [address], [place], [place]  [address] Phone: [phone]; Fax: [phone]

## 2020-09-10 ENCOUNTER — Other Ambulatory Visit: Payer: Self-pay

## 2020-09-10 MED ORDER — ISOSORBIDE MONONITRATE ER 60 MG PO TB24: 60.0000 mg | ORAL_TABLET | Freq: Every day | ORAL | 0 refills | Status: DC

## 2020-09-11 ENCOUNTER — Ambulatory Visit: Payer: Medicare Other | Admitting: Physician Assistant

## 2020-09-11 DIAGNOSIS — I2510 Atherosclerotic heart disease of native coronary artery without angina pectoris: Secondary | ICD-10-CM

## 2020-09-11 DIAGNOSIS — I10 Essential (primary) hypertension: Secondary | ICD-10-CM

## 2020-09-11 DIAGNOSIS — R000 Tachycardia, unspecified: Secondary | ICD-10-CM

## 2020-09-11 DIAGNOSIS — N184 Chronic kidney disease, stage 4 (severe): Secondary | ICD-10-CM

## 2020-09-11 DIAGNOSIS — I5032 Chronic diastolic (congestive) heart failure: Secondary | ICD-10-CM

## 2020-09-11 MED ORDER — ISOSORBIDE MONONITRATE ER 60 MG PO TB24: 60.0000 mg | ORAL_TABLET | Freq: Every day | ORAL | 0 refills | Status: DC

## 2020-09-11 NOTE — Addendum Note (Signed)
Addended by: Carter Kitten D on: 09/11/2020 10:35 AM   Modules accepted: Orders

## 2020-09-18 ENCOUNTER — Encounter (HOSPITAL_COMMUNITY): Payer: Self-pay | Admitting: Emergency Medicine

## 2020-09-18 ENCOUNTER — Ambulatory Visit (HOSPITAL_COMMUNITY)
Admission: EM | Admit: 2020-09-18 | Discharge: 2020-09-18 | Disposition: A | Discharge status: Home / Self Care | Payer: Medicare Other | Attending: Internal Medicine | Admitting: Internal Medicine

## 2020-09-18 ENCOUNTER — Other Ambulatory Visit: Payer: Self-pay

## 2020-09-18 ENCOUNTER — Ambulatory Visit (INDEPENDENT_AMBULATORY_CARE_PROVIDER_SITE_OTHER): Payer: Medicare Other

## 2020-09-18 DIAGNOSIS — R0781 Pleurodynia: Secondary | ICD-10-CM

## 2020-09-18 DIAGNOSIS — S2231XA Fracture of one rib, right side, initial encounter for closed fracture: Secondary | ICD-10-CM

## 2020-09-18 NOTE — Discharge Instructions (Addendum)
Continue using Tylenol as needed for pain.  You can also try over-the-counter Voltaren gel.  Rub on affected area as needed for pain relief.  As we discussed, make sure you are taking frequent deep breaths to avoid developing pneumonia.  Please return or go to ER for any worsening or persistent symptoms.

## 2020-09-18 NOTE — ED Triage Notes (Signed)
Pt reports that feel on Friday when coming in house from outside. C/o right sided back pain. Pt takes Plavix. Denies LOC.

## 2020-09-18 NOTE — ED Provider Notes (Signed)
[place]    CSN: [id number] Arrival date & time: 09/18/20  1549      History   Chief Complaint Chief Complaint  Patient presents with   Fall   Back Pain    HPI [name] is a 87 y.o. female with multiple medical problems and lives alone presents to urgent care after fall 5 days prior.  Patient reports having "a couple of beers" and tripping while walking into her home.  Patient states she fell knocking over a TV and hitting upper right back.  She denies any loss of consciousness, no dizziness or palpitations prior to fall.  States she was able to get herself up after fall and change for bed.  Now with upper right-sided back pain.  Taking extra strength Tylenol with some relief.    Past Medical History:  Diagnosis Date   Anemia 04/03/2011   Anginal pain ([place])    a. 02/2014 MV: Low risk; b. 05/2017 MV: small area of inferior basal ischemia. EF 54% - low risk-->Med mgmt.   Anxiety    Aortic valve sclerosis w/ systolic murmur    a. 71/2458 AoV sclerosis w/o stenosis.   Arthritis    "feet" (06/02/2017)   Blind left eye 1999   CKD (chronic kidney disease) stage 3, GFR 30-59 ml/min (HCC)    Depression    Diastolic dysfunction    a. 10/2018 Echo: EF 60-65%, mild septal/posterior LVH. Impaired relaxation. Nl RV size/fxn. Mod dil LA. Trace TR. Triv AI. Mod AoV sclerosis w/o stenosis. Nl PASP.   Exertional dyspnea    GERD (gastroesophageal reflux disease)    High cholesterol    Hip fracture ([place]) 07/17/11   fall from 1-2 feet; left   Hypertension    Ischemic stroke ([place]) 05/2017   a. 06/2017 Holter: RSR, PACs, no afib.   Prosthetic eye globe 1999   "from diabetic; left eye"   Type II diabetes mellitus ([place])    not taking any medication (06/02/2017)    Patient Active Problem List   Diagnosis Date Noted   Acute exacerbation of congestive heart failure ([place]) 05/23/2020   Shortness of breath 05/21/2020   Acute hypoxemic respiratory failure ([place]) 11/24/2019    Hypoxia 11/23/2019   Hemiparesis due to old stroke ([place]) 05/12/2019   Tachycardia    Coronary artery disease involving native coronary artery of native heart without angina pectoris    Unstable angina ([place]) 04/08/2019   HTN (hypertension) 04/07/2019   HLD (hyperlipidemia) 04/07/2019   Elevated troponin 04/07/2019   AKI (acute kidney injury) ([place]) 11/01/2018   Leukocytosis 02/22/2018   Influenza A 02/21/2018   Wheezy bronchitis 02/21/2018   Acute bronchitis 02/21/2018   Hypertension associated with stage 3 chronic kidney disease due to type 2 diabetes mellitus ([place]) 06/09/2017   Dyslipidemia associated with type 2 diabetes mellitus ([place]) 06/09/2017   Stage 3 chronic kidney disease due to type 2 diabetes mellitus ([place]) 06/09/2017   Peripheral sensory neuropathy due to type 2 diabetes mellitus ([place]) 06/09/2017   GERD without esophagitis 06/09/2017   Glaucoma due to type 2 diabetes mellitus ([place]) 06/09/2017   Alcohol abuse 06/04/2017   Ischemic stroke ([place]) 06/02/2017   Type 2 diabetes mellitus with vascular disease ([place]) 06/02/2017   Chest pain 06/02/2017   Closed non-physeal fracture of metatarsal bone of right foot 07/31/2016   Hypertensive urgency 02/17/2014   Chronic diastolic heart failure ([place]) 03/15/2013   Closed left hip fracture ([place]) 07/17/2011    Past Surgical History:  Procedure Laterality Date   ENUCLEATION Left    FRACTURE SURGERY     HIP ARTHROPLASTY  07/18/2011   Procedure: ARTHROPLASTY BIPOLAR HIP;  Surgeon: Nita Sells, MD;  Location: [facility];  Service: Orthopedics;  Laterality: Left;   INTRAOCULAR PROSTHESES INSERTION  1999   left   LEFT HEART CATH AND CORS/GRAFTS ANGIOGRAPHY N/A 04/08/2019   Procedure: LEFT HEART CATH AND CORS/GRAFTS ANGIOGRAPHY;  Surgeon: Minna Merritts, MD;  Location: [facility];  Service: Cardiovascular;  Laterality: N/A;   [place]   "cancer"   TUBAL LIGATION  1970's    OB History   No obstetric history  on file.      Home Medications    Prior to Admission medications   Medication Sig Start Date End Date Taking? Authorizing Provider  acetaminophen (TYLENOL) 325 MG tablet Take 2 tablets (650 mg total) by mouth every 6 (six) hours as needed for mild pain or fever (or temp > 37.5 C (99.5 F)). 06/05/17   Elgergawy, Silver Huguenin, MD  albuterol (VENTOLIN HFA) 108 (90 Base) MCG/ACT inhaler Inhale 2 puffs into the lungs every 6 (six) hours as needed for wheezing or shortness of breath.    [provider]  amLODipine (NORVASC) 10 MG tablet Take 1 tablet (10 mg total) by mouth daily. 06/09/19 11/24/19  Darliss Cheney, MD  atorvastatin (LIPITOR) 40 MG tablet Take 1 tablet (40 mg total) by mouth daily at 6 PM. Patient taking differently: Take 40 mg by mouth at bedtime. 06/08/19   Darliss Cheney, MD  carvedilol (COREG) 12.5 MG tablet Take 1 tablet (12.5 mg total) by mouth 2 (two) times daily with a meal. 06/08/19 11/24/19  Darliss Cheney, MD  cetirizine (ZYRTEC) 10 MG tablet Take 10 mg by mouth daily as needed for allergies or rhinitis.    [provider]  clopidogrel (PLAVIX) 75 MG tablet Take 75 mg by mouth daily.    [provider]  Cobalamin Combinations (NEURIVA PLUS) CAPS Take 1 capsule by mouth daily with breakfast.    [provider]  fluticasone (FLONASE) 50 MCG/ACT nasal spray Place 1-2 sprays into both nostrils daily as needed for allergies or rhinitis.    [provider]  folic acid (FOLVITE) 1 MG tablet Take 1 tablet (1 mg total) by mouth daily. Patient not taking: Reported on 05/21/2020 06/06/17   Elgergawy, Silver Huguenin, MD  gabapentin (NEURONTIN) 300 MG capsule Take 1 capsule (300 mg total) by mouth daily. 05/25/20   Armando Reichert, MD  guaiFENesin (MUCINEX) 600 MG 12 hr tablet Take 1 tablet (600 mg total) by mouth 2 (two) times daily as needed for to loosen phlegm. 05/24/20   Armando Reichert, MD  hydrALAZINE (APRESOLINE) 10 MG tablet TAKE 1 TABLET(10 MG) BY MOUTH  THREE TIMES DAILY Patient taking differently: Take 10 mg by mouth 3 (three) times daily. 08/01/19   Belva Crome, MD  isosorbide mononitrate (IMDUR) 60 MG 24 hr tablet Take 1 tablet (60 mg total) by mouth daily. Please make overdue appt with Dr. Tamala Julian before anymore refills. Thank you 2nd attempt 09/11/20   Belva Crome, MD  latanoprost (XALATAN) 0.005 % ophthalmic solution Place 1 drop into the right eye at bedtime. 05/07/20   [provider]  Multiple Vitamins-Minerals (ONE-A-DAY WOMENS 50+) TABS Take 1 tablet by mouth daily with breakfast.    [provider]  neomycin-polymyxin-hydrocortisone (CORTISPORIN) 3.5-10000-1 OTIC suspension Place 2-3 drops into the right ear at bedtime.  [provider]  nicotine (NICODERM CQ - DOSED IN MG/24 HR) 7 mg/24hr patch Place 1 patch (7 mg total) onto the skin daily. 05/25/20   Armando Reichert, MD  nitroGLYCERIN (NITROSTAT) 0.4 MG SL tablet Place 1 tablet (0.4 mg total) under the tongue every 5 (five) minutes as needed for chest pain. 02/11/19 11/24/19  Isaiah Serge, NP  omeprazole (PRILOSEC) 20 MG capsule TAKE 1 CAPSULE BY MOUTH  DAILY 09/04/20   Isaiah Serge, NP    Family History Family History  Problem Relation Age of Onset   Diabetes type II Mother    Diabetes type II Father    Diabetes type II Sister    Colon cancer Neg Hx     Social History Social History   Tobacco Use   Smoking status: Former    Packs/day: 1.50    Years: 47.00    Pack years: 70.50    Types: Cigarettes    Quit date: 09/13/1997    Years since quitting: 23.0   Smokeless tobacco: Never  Vaping Use   Vaping Use: Never used  Substance Use Topics   Alcohol use: Not Currently    Alcohol/week: 17.0 standard drinks    Types: 18 Shots of liquor per week    Comment: Quit 03/2018   Drug use: No    Types: Marijuana, Heroin    Comment: 06/02/2017  "last drug use was in the 1980s." Prev injected heroin.     Allergies   Tape   Review of  Systems As stated in HPI otherwise negative   Physical Exam Triage Vital Signs ED Triage Vitals  Enc Vitals Group     BP 09/18/20 1629 (!) 176/72     Pulse Rate 09/18/20 1629 90     Resp 09/18/20 1629 19     Temp 09/18/20 1629 98.2 F (36.8 C)     Temp Source 09/18/20 1629 Oral     SpO2 09/18/20 1629 92 %     Weight --      Height --      Head Circumference --      Peak Flow --      Pain Score 09/18/20 1626 10     Pain Loc --      Pain Edu? --      Excl. in [place]? --    No data found.  Updated Vital Signs BP (!) 176/72 (BP Location: Right Arm)   Pulse 90   Temp 98.2 F (36.8 C) (Oral)   Resp 19   SpO2 96%   Visual Acuity Right Eye Distance:   Left Eye Distance:   Bilateral Distance:    Right Eye Near:   Left Eye Near:    Bilateral Near:     Physical Exam Constitutional:      General: She is not in acute distress.    Appearance: Normal appearance. She is not toxic-appearing.  HENT:     Head: Normocephalic and atraumatic.  Cardiovascular:     Rate and Rhythm: Normal rate and regular rhythm.  Pulmonary:     Effort: Pulmonary effort is normal.     Breath sounds: Normal breath sounds.  Abdominal:     General: Bowel sounds are normal.     Palpations: Abdomen is soft.  Musculoskeletal:        General: Tenderness present. No swelling or deformity.     Cervical back: Normal range of motion.     Comments: TTP upon palpation of right mid thoracic region.  No CVA tenderness  Skin:    General: Skin is warm and dry.     Findings: No bruising.  Neurological:     General: No focal deficit present.     Mental Status: She is alert and oriented to person, place, and time.  Psychiatric:        Mood and Affect: Mood normal.        Behavior: Behavior normal.     UC Treatments / Results  Labs (all labs ordered are listed, but only abnormal results are displayed) Labs Reviewed - No data to display  EKG   Radiology DG Ribs Unilateral W/Chest Right  Result Date:  09/18/2020 CLINICAL DATA:  Fall with right-sided pain. EXAM: RIGHT RIBS AND CHEST - 3+ VIEW COMPARISON:  Chest x-ray 07/13/2020.  Chest CT 11/23/2019. FINDINGS: There is a questionable nondisplaced anterolateral right eighth rib fracture. No other fractu acute rib fractures are visualized. There is no evidence of pneumothorax or pleural effusion. Both lungs are clear. Heart is enlarged, unchanged. The aorta is ectatic and contains atherosclerotic calcifications. IMPRESSION: 1. Questionable nondisplaced anterolateral right eighth rib fracture. Please correlate clinically for point tenderness. 2. Cardiomegaly. 3.  Aortic Atherosclerosis (ICD10-I70.0). Electronically Signed   By: Ronney Asters M.D.   On: 09/18/2020 17:13    Procedures Procedures (including critical care time)  Medications Ordered in UC Medications - No data to display  Initial Impression / Assessment and Plan / UC Course  I have reviewed the triage vital signs and the nursing notes.  Pertinent labs & imaging results that were available during my care of the patient were reviewed by me and considered in my medical decision making (see chart for details).  Fall Rib fracture -Mechanical fall with no presyncopal symptoms, no LOC, O2 sat stable.  Fall occurred 5 days prior to visit -X-ray and exam consistent with eighth anterolateral rib fracture -Given advanced age and multiple medical problems will continue with Tylenol as needed.  Directed to try Voltaren cream to affected area. -Consider lidocaine patch if returns for continued pain -Patient and grandson counseled on importance of frequent deep breathing to avoid developing pneumonia -Use walker -Return or go to ER or any worsening or new symptoms  Reviewed expections re: course of current medical issues. Questions answered. Outlined signs and symptoms indicating need for more acute intervention. Pt verbalized understanding. AVS given  Final Clinical Impressions(s) / UC  Diagnoses   Final diagnoses:  Closed fracture of one rib of right side, initial encounter     Discharge Instructions      Continue using Tylenol as needed for pain.  You can also try over-the-counter Voltaren gel.  Rub on affected area as needed for pain relief.  As we discussed, make sure you are taking frequent deep breaths to avoid developing pneumonia.  Please return or go to ER for any worsening or persistent symptoms.     ED Prescriptions   None    PDMP not reviewed this encounter.   Rudolpho Sevin, NP 09/18/20 [phone]

## 2020-09-24 ENCOUNTER — Other Ambulatory Visit: Payer: Self-pay

## 2020-09-24 ENCOUNTER — Other Ambulatory Visit: Payer: Medicare Other | Admitting: Hospice

## 2020-09-24 DIAGNOSIS — R1310 Dysphagia, unspecified: Secondary | ICD-10-CM

## 2020-09-24 DIAGNOSIS — I5032 Chronic diastolic (congestive) heart failure: Secondary | ICD-10-CM

## 2020-09-24 DIAGNOSIS — Z515 Encounter for palliative care: Secondary | ICD-10-CM

## 2020-09-24 DIAGNOSIS — S2231XD Fracture of one rib, right side, subsequent encounter for fracture with routine healing: Secondary | ICD-10-CM

## 2020-09-24 NOTE — Progress Notes (Signed)
[facility] Consult Note Telephone: [phone]  Fax: [phone]  PATIENT NAME: Tenesia Escudero DOB: 10-Dec-1932 MRN: [id number]  PRIMARY CARE PROVIDER:   Dierdre Harness, [place], [place], [place],  [place] [address]  REFERRING PROVIDER: Dierdre Harness, [place], [place], [place],   [address]  RESPONSIBLE PARTY:  Self Berry is the emergency contact Contact Information     Name Relation Home Work Mobile   [place],Tynisa Granddaughter   [place]   [phone]       Visit is to build trust and highlight Palliative Medicine as specialized medical care for people living with serious illness, aimed at facilitating better quality of life through symptoms relief, assisting with advance care planning and complex medical decision making. This is a follow up visit.  RECOMMENDATIONS/PLAN:   Advance Care Planning/Code Status:Patient is a Do Not Resuscitate.   Goals of Care: Goals of care include to maximize quality of life and symptom management.  Visit consisted of counseling and education dealing with the complex and emotionally intense issues of symptom management and palliative care in the setting of serious and potentially life-threatening illness. Palliative care team will continue to support patient, patient's family, and medical team.  Symptom management/Plan:  Closed fracture of one rib of right side: Continue Voltaren gel and Tylenol as ordered. Encouraged use of a cane/rolling walker for support and help prevent a fall. Patient seeing PCP this afternoon; she said she will ask for Home health aide/PT/OT. This is recommended as patient will benefit from these services for assistance in ADLs, strengthening and gait training.  Dysphagia: related to throat cancer for which she completed chemo/radiation. She reports throat cancer is in  remission. Encouraged small bites with sips, soft foods, thin liquids. ST consult as needed.  Choking precautions discussed.  Type 2 DM: Managed with diet. A1c 05/22/2020 is 5.5. No concentrated sweets. Repeat A1c in 3 - 6  months. Routine CBC BMP. CHF: Lasix, Carvedilol, Amlodipine. Reduced salt  Follow up: Palliative care will continue to follow for complex medical decision making, advance care planning, and clarification of goals. Return 6 weeks or prn. Encouraged to call provider sooner with any concerns.  CHIEF COMPLAINT: Palliative follow up  HISTORY OF PRESENT ILLNESS:  Jenisis Harmsen a 87 y.o. female with multiple medical problems including  Closed fracture of one rib of right side following a fall. She said she was not with her rolling walker. She experiences moderate pain pain occassionally;  Coughing worsens the pain in her right rib area but no longer having cough for some days now. Denies syncope, reports Tylenol and Voltaren gel are effective in managing the pain; no respiratory distress. History of Type 2 DM, CAD, dysphagia, diastolic CHF. History obtained from review of EMR, discussion with primary team, family and/or patient. Records reviewed and summarized above. All 10 point systems reviewed and are negative except as documented in history of present illness above  Review and summarization of Epic records shows history from other than patient.   Palliative Care was asked to follow this patient o help address complex decision making in the context of advance care planning and goals of care clarification.   PHYSICAL EXAM  General: In no acute distress, appropriately dressed Cardiovascular: regular rate and rhythm; no edema in BLE Pulmonary: no cough, no increased work of breathing, normal respiratory effort Abdomen: soft, non tender, no guarding, positive  bowel sounds in all quadrants GU:  no suprapubic tenderness Eyes: Normal lids, no discharge ENMT: Moist mucous  membranes Musculoskeletal:  weakness, ambulatory Skin: no rash to visible skin, warm without cyanosis,  Psych: non-anxious affect Neurological: Weakness but otherwise non focal Heme/lymph/immuno: no bruises, no bleeding  PERTINENT MEDICATIONS:  Outpatient Encounter Medications as of 09/24/2020  Medication Sig   acetaminophen (TYLENOL) 325 MG tablet Take 2 tablets (650 mg total) by mouth every 6 (six) hours as needed for mild pain or fever (or temp > 37.5 C (99.5 F)).   albuterol (VENTOLIN HFA) 108 (90 Base) MCG/ACT inhaler Inhale 2 puffs into the lungs every 6 (six) hours as needed for wheezing or shortness of breath.   amLODipine (NORVASC) 10 MG tablet Take 1 tablet (10 mg total) by mouth daily.   atorvastatin (LIPITOR) 40 MG tablet Take 1 tablet (40 mg total) by mouth daily at 6 PM. (Patient taking differently: Take 40 mg by mouth at bedtime.)   carvedilol (COREG) 12.5 MG tablet Take 1 tablet (12.5 mg total) by mouth 2 (two) times daily with a meal.   cetirizine (ZYRTEC) 10 MG tablet Take 10 mg by mouth daily as needed for allergies or rhinitis.   clopidogrel (PLAVIX) 75 MG tablet Take 75 mg by mouth daily.   Cobalamin Combinations (NEURIVA PLUS) CAPS Take 1 capsule by mouth daily with breakfast.   fluticasone (FLONASE) 50 MCG/ACT nasal spray Place 1-2 sprays into both nostrils daily as needed for allergies or rhinitis.   folic acid (FOLVITE) 1 MG tablet Take 1 tablet (1 mg total) by mouth daily. (Patient not taking: Reported on 05/21/2020)   gabapentin (NEURONTIN) 300 MG capsule Take 1 capsule (300 mg total) by mouth daily.   guaiFENesin (MUCINEX) 600 MG 12 hr tablet Take 1 tablet (600 mg total) by mouth 2 (two) times daily as needed for to loosen phlegm.   hydrALAZINE (APRESOLINE) 10 MG tablet TAKE 1 TABLET(10 MG) BY MOUTH THREE TIMES DAILY (Patient taking differently: Take 10 mg by mouth 3 (three) times daily.)   isosorbide mononitrate (IMDUR) 60 MG 24 hr tablet Take 1 tablet (60 mg total)  by mouth daily. Please make overdue appt with Dr. Tamala Julian before anymore refills. Thank you 2nd attempt   latanoprost (XALATAN) 0.005 % ophthalmic solution Place 1 drop into the right eye at bedtime.   Multiple Vitamins-Minerals (ONE-A-DAY WOMENS 50+) TABS Take 1 tablet by mouth daily with breakfast.   neomycin-polymyxin-hydrocortisone (CORTISPORIN) 3.5-10000-1 OTIC suspension Place 2-3 drops into the right ear at bedtime.   nicotine (NICODERM CQ - DOSED IN MG/24 HR) 7 mg/24hr patch Place 1 patch (7 mg total) onto the skin daily.   nitroGLYCERIN (NITROSTAT) 0.4 MG SL tablet Place 1 tablet (0.4 mg total) under the tongue every 5 (five) minutes as needed for chest pain.   omeprazole (PRILOSEC) 20 MG capsule TAKE 1 CAPSULE BY MOUTH  DAILY   No facility-administered encounter medications on file as of 09/24/2020.    HOSPICE ELIGIBILITY/DIAGNOSIS: TBD  PAST MEDICAL HISTORY:  Past Medical History:  Diagnosis Date   Anemia 04/03/2011   Anginal pain ([place])    a. 02/2014 MV: Low risk; b. 05/2017 MV: small area of inferior basal ischemia. EF 54% - low risk-->Med mgmt.   Anxiety    Aortic valve sclerosis w/ systolic murmur    a. 63/0160 AoV sclerosis w/o stenosis.   Arthritis    "feet" (06/02/2017)   Blind left eye 1999   CKD (chronic kidney disease) stage 3,  GFR 30-59 ml/min (HCC)    Depression    Diastolic dysfunction    a. 10/2018 Echo: EF 60-65%, mild septal/posterior LVH. Impaired relaxation. Nl RV size/fxn. Mod dil LA. Trace TR. Triv AI. Mod AoV sclerosis w/o stenosis. Nl PASP.   Exertional dyspnea    GERD (gastroesophageal reflux disease)    High cholesterol    Hip fracture ([place]) 07/17/11   fall from 1-2 feet; left   Hypertension    Ischemic stroke ([place]) 05/2017   a. 06/2017 Holter: RSR, PACs, no afib.   Prosthetic eye globe 1999   "from diabetic; left eye"   Type II diabetes mellitus ([place])    not taking any medication (06/02/2017)      ALLERGIES:  Allergies  Allergen Reactions   Tape  Other (See Comments)    SKIN IS SENSITIVE      I spent 60 minutes providing this consultation; this includes time spent with patient/family, chart review and documentation. More than 50% of the time in this consultation was spent on counseling and coordinating communication   Thank you for the opportunity to participate in the care of Virna Livengood Please call our office at [phone] if we can be of additional assistance.  Note: Portions of this note were generated with [employer]. Dictation errors may occur despite best attempts at proofreading.  Teodoro Spray, NP

## 2020-10-01 ENCOUNTER — Other Ambulatory Visit: Payer: Self-pay | Admitting: *Deleted

## 2020-10-01 MED ORDER — ISOSORBIDE MONONITRATE ER 60 MG PO TB24: 60.0000 mg | ORAL_TABLET | Freq: Every day | ORAL | 0 refills | Status: DC

## 2020-12-02 ENCOUNTER — Other Ambulatory Visit: Payer: Self-pay | Admitting: Interventional Cardiology

## 2020-12-12 ENCOUNTER — Emergency Department (HOSPITAL_COMMUNITY): Payer: Medicare Other

## 2020-12-12 ENCOUNTER — Other Ambulatory Visit: Payer: Self-pay

## 2020-12-12 ENCOUNTER — Emergency Department (HOSPITAL_COMMUNITY)
Admission: EM | Admit: 2020-12-12 | Discharge: 2020-12-13 | Disposition: A | Discharge status: Home / Self Care | Payer: Medicare Other | Source: Home / Self Care

## 2020-12-12 DIAGNOSIS — R531 Weakness: Secondary | ICD-10-CM | POA: Insufficient documentation

## 2020-12-12 DIAGNOSIS — E860 Dehydration: Secondary | ICD-10-CM | POA: Diagnosis not present

## 2020-12-12 DIAGNOSIS — I130 Hypertensive heart and chronic kidney disease with heart failure and stage 1 through stage 4 chronic kidney disease, or unspecified chronic kidney disease: Secondary | ICD-10-CM | POA: Diagnosis not present

## 2020-12-12 DIAGNOSIS — I10 Essential (primary) hypertension: Secondary | ICD-10-CM | POA: Insufficient documentation

## 2020-12-12 DIAGNOSIS — R197 Diarrhea, unspecified: Secondary | ICD-10-CM | POA: Insufficient documentation

## 2020-12-12 DIAGNOSIS — R0602 Shortness of breath: Secondary | ICD-10-CM | POA: Insufficient documentation

## 2020-12-12 DIAGNOSIS — Z20822 Contact with and (suspected) exposure to covid-19: Secondary | ICD-10-CM | POA: Insufficient documentation

## 2020-12-12 DIAGNOSIS — Z5321 Procedure and treatment not carried out due to patient leaving prior to being seen by health care provider: Secondary | ICD-10-CM | POA: Insufficient documentation

## 2020-12-12 LAB — CBC
HCT: 32.7 % — ABNORMAL LOW (ref 36.0–46.0)
Hemoglobin: 10.6 g/dL — ABNORMAL LOW (ref 12.0–15.0)
MCH: 29 pg (ref 26.0–34.0)
MCHC: 32.4 g/dL (ref 30.0–36.0)
MCV: 89.6 fL (ref 80.0–100.0)
Platelets: 259 10*3/uL (ref 150–400)
RBC: 3.65 MIL/uL — ABNORMAL LOW (ref 3.87–5.11)
RDW: 16.5 % — ABNORMAL HIGH (ref 11.5–15.5)
WBC: 8.4 10*3/uL (ref 4.0–10.5)
nRBC: 0.6 % — ABNORMAL HIGH (ref 0.0–0.2)

## 2020-12-12 LAB — RESP PANEL BY RT-PCR (FLU A&B, COVID) ARPGX2
Influenza A by PCR: NEGATIVE
Influenza B by PCR: NEGATIVE
SARS Coronavirus 2 by RT PCR: NEGATIVE

## 2020-12-12 MED ORDER — ALBUTEROL SULFATE HFA 108 (90 BASE) MCG/ACT IN AERS: 2.0000 | INHALATION_SPRAY | RESPIRATORY_TRACT | Status: DC | PRN

## 2020-12-12 NOTE — ED Triage Notes (Signed)
BIB POV by granddaughter. Pt reports increase weakness, poor PO intake, and SHOB x 3 days. Granddaughter also reports pt having diarrhea.  PMH: HTN

## 2020-12-12 NOTE — ED Notes (Signed)
Unable to get labs ?

## 2020-12-13 NOTE — ED Notes (Signed)
Patient left without being seen due to wait times

## 2020-12-14 ENCOUNTER — Inpatient Hospital Stay (HOSPITAL_COMMUNITY)
Admission: EM | Admit: 2020-12-14 | Discharge: 2020-12-17 | DRG: 291 | Disposition: A | Discharge status: Hospice | Payer: Medicare Other | Attending: Student in an Organized Health Care Education/Training Program | Admitting: Student in an Organized Health Care Education/Training Program

## 2020-12-14 ENCOUNTER — Other Ambulatory Visit: Payer: Self-pay

## 2020-12-14 DIAGNOSIS — Z96642 Presence of left artificial hip joint: Secondary | ICD-10-CM | POA: Diagnosis present

## 2020-12-14 DIAGNOSIS — Z66 Do not resuscitate: Secondary | ICD-10-CM | POA: Diagnosis present

## 2020-12-14 DIAGNOSIS — Z9114 Patient's other noncompliance with medication regimen: Secondary | ICD-10-CM

## 2020-12-14 DIAGNOSIS — I700 Atherosclerosis of aorta: Secondary | ICD-10-CM | POA: Diagnosis present

## 2020-12-14 DIAGNOSIS — H409 Unspecified glaucoma: Secondary | ICD-10-CM | POA: Diagnosis present

## 2020-12-14 DIAGNOSIS — Z970 Presence of artificial eye: Secondary | ICD-10-CM

## 2020-12-14 DIAGNOSIS — Z7902 Long term (current) use of antithrombotics/antiplatelets: Secondary | ICD-10-CM

## 2020-12-14 DIAGNOSIS — K529 Noninfective gastroenteritis and colitis, unspecified: Secondary | ICD-10-CM | POA: Diagnosis present

## 2020-12-14 DIAGNOSIS — R778 Other specified abnormalities of plasma proteins: Secondary | ICD-10-CM | POA: Diagnosis present

## 2020-12-14 DIAGNOSIS — K219 Gastro-esophageal reflux disease without esophagitis: Secondary | ICD-10-CM | POA: Diagnosis present

## 2020-12-14 DIAGNOSIS — F32A Depression, unspecified: Secondary | ICD-10-CM | POA: Diagnosis present

## 2020-12-14 DIAGNOSIS — Z781 Physical restraint status: Secondary | ICD-10-CM

## 2020-12-14 DIAGNOSIS — Z20822 Contact with and (suspected) exposure to covid-19: Secondary | ICD-10-CM | POA: Diagnosis present

## 2020-12-14 DIAGNOSIS — E860 Dehydration: Principal | ICD-10-CM

## 2020-12-14 DIAGNOSIS — G9341 Metabolic encephalopathy: Secondary | ICD-10-CM | POA: Diagnosis present

## 2020-12-14 DIAGNOSIS — N2889 Other specified disorders of kidney and ureter: Secondary | ICD-10-CM | POA: Diagnosis present

## 2020-12-14 DIAGNOSIS — Z87891 Personal history of nicotine dependence: Secondary | ICD-10-CM

## 2020-12-14 DIAGNOSIS — L8990 Pressure ulcer of unspecified site, unspecified stage: Secondary | ICD-10-CM | POA: Insufficient documentation

## 2020-12-14 DIAGNOSIS — E1122 Type 2 diabetes mellitus with diabetic chronic kidney disease: Secondary | ICD-10-CM | POA: Diagnosis present

## 2020-12-14 DIAGNOSIS — E1140 Type 2 diabetes mellitus with diabetic neuropathy, unspecified: Secondary | ICD-10-CM | POA: Diagnosis present

## 2020-12-14 DIAGNOSIS — D631 Anemia in chronic kidney disease: Secondary | ICD-10-CM | POA: Diagnosis present

## 2020-12-14 DIAGNOSIS — E785 Hyperlipidemia, unspecified: Secondary | ICD-10-CM | POA: Diagnosis present

## 2020-12-14 DIAGNOSIS — E870 Hyperosmolality and hypernatremia: Secondary | ICD-10-CM | POA: Diagnosis present

## 2020-12-14 DIAGNOSIS — Z91048 Other nonmedicinal substance allergy status: Secondary | ICD-10-CM

## 2020-12-14 DIAGNOSIS — R64 Cachexia: Secondary | ICD-10-CM | POA: Diagnosis present

## 2020-12-14 DIAGNOSIS — M1990 Unspecified osteoarthritis, unspecified site: Secondary | ICD-10-CM | POA: Diagnosis present

## 2020-12-14 DIAGNOSIS — I5181 Takotsubo syndrome: Secondary | ICD-10-CM | POA: Diagnosis present

## 2020-12-14 DIAGNOSIS — Z6825 Body mass index (BMI) 25.0-25.9, adult: Secondary | ICD-10-CM

## 2020-12-14 DIAGNOSIS — H5462 Unqualified visual loss, left eye, normal vision right eye: Secondary | ICD-10-CM | POA: Diagnosis present

## 2020-12-14 DIAGNOSIS — N1330 Unspecified hydronephrosis: Secondary | ICD-10-CM

## 2020-12-14 DIAGNOSIS — L89152 Pressure ulcer of sacral region, stage 2: Secondary | ICD-10-CM | POA: Diagnosis present

## 2020-12-14 DIAGNOSIS — N179 Acute kidney failure, unspecified: Secondary | ICD-10-CM | POA: Diagnosis present

## 2020-12-14 DIAGNOSIS — Z79899 Other long term (current) drug therapy: Secondary | ICD-10-CM

## 2020-12-14 DIAGNOSIS — I130 Hypertensive heart and chronic kidney disease with heart failure and stage 1 through stage 4 chronic kidney disease, or unspecified chronic kidney disease: Principal | ICD-10-CM | POA: Diagnosis present

## 2020-12-14 DIAGNOSIS — F419 Anxiety disorder, unspecified: Secondary | ICD-10-CM | POA: Diagnosis present

## 2020-12-14 DIAGNOSIS — K7200 Acute and subacute hepatic failure without coma: Secondary | ICD-10-CM | POA: Diagnosis present

## 2020-12-14 DIAGNOSIS — I2510 Atherosclerotic heart disease of native coronary artery without angina pectoris: Secondary | ICD-10-CM | POA: Diagnosis present

## 2020-12-14 DIAGNOSIS — R0602 Shortness of breath: Secondary | ICD-10-CM

## 2020-12-14 DIAGNOSIS — N184 Chronic kidney disease, stage 4 (severe): Secondary | ICD-10-CM | POA: Diagnosis present

## 2020-12-14 DIAGNOSIS — J9601 Acute respiratory failure with hypoxia: Secondary | ICD-10-CM | POA: Diagnosis present

## 2020-12-14 DIAGNOSIS — M6282 Rhabdomyolysis: Secondary | ICD-10-CM | POA: Diagnosis present

## 2020-12-14 DIAGNOSIS — E7800 Pure hypercholesterolemia, unspecified: Secondary | ICD-10-CM | POA: Diagnosis present

## 2020-12-14 DIAGNOSIS — K7460 Unspecified cirrhosis of liver: Secondary | ICD-10-CM | POA: Diagnosis present

## 2020-12-14 DIAGNOSIS — Z8673 Personal history of transient ischemic attack (TIA), and cerebral infarction without residual deficits: Secondary | ICD-10-CM

## 2020-12-14 DIAGNOSIS — I5032 Chronic diastolic (congestive) heart failure: Secondary | ICD-10-CM | POA: Diagnosis present

## 2020-12-14 DIAGNOSIS — I358 Other nonrheumatic aortic valve disorders: Secondary | ICD-10-CM | POA: Diagnosis present

## 2020-12-14 DIAGNOSIS — R627 Adult failure to thrive: Secondary | ICD-10-CM | POA: Diagnosis present

## 2020-12-14 DIAGNOSIS — E1169 Type 2 diabetes mellitus with other specified complication: Secondary | ICD-10-CM | POA: Diagnosis present

## 2020-12-14 DIAGNOSIS — E872 Acidosis, unspecified: Secondary | ICD-10-CM | POA: Diagnosis present

## 2020-12-14 DIAGNOSIS — Z515 Encounter for palliative care: Secondary | ICD-10-CM

## 2020-12-14 LAB — CBC WITH DIFFERENTIAL/PLATELET
Abs Immature Granulocytes: 0.03 10*3/uL (ref 0.00–0.07)
Basophils Absolute: 0 10*3/uL (ref 0.0–0.1)
Basophils Relative: 0 %
Eosinophils Absolute: 0 10*3/uL (ref 0.0–0.5)
Eosinophils Relative: 0 %
HCT: 35.2 % — ABNORMAL LOW (ref 36.0–46.0)
Hemoglobin: 11.3 g/dL — ABNORMAL LOW (ref 12.0–15.0)
Immature Granulocytes: 0 %
Lymphocytes Relative: 5 %
Lymphs Abs: 0.4 10*3/uL — ABNORMAL LOW (ref 0.7–4.0)
MCH: 28.8 pg (ref 26.0–34.0)
MCHC: 32.1 g/dL (ref 30.0–36.0)
MCV: 89.8 fL (ref 80.0–100.0)
Monocytes Absolute: 0.5 10*3/uL (ref 0.1–1.0)
Monocytes Relative: 6 %
Neutro Abs: 8 10*3/uL — ABNORMAL HIGH (ref 1.7–7.7)
Neutrophils Relative %: 89 %
Platelets: 198 10*3/uL (ref 150–400)
RBC: 3.92 MIL/uL (ref 3.87–5.11)
RDW: 16.5 % — ABNORMAL HIGH (ref 11.5–15.5)
WBC: 9 10*3/uL (ref 4.0–10.5)
nRBC: 2.2 % — ABNORMAL HIGH (ref 0.0–0.2)

## 2020-12-14 LAB — RESP PANEL BY RT-PCR (FLU A&B, COVID) ARPGX2
Influenza A by PCR: NEGATIVE
Influenza B by PCR: NEGATIVE
SARS Coronavirus 2 by RT PCR: NEGATIVE

## 2020-12-14 NOTE — ED Provider Notes (Signed)
Emergency Medicine Provider Triage Evaluation Note  [name] , a 87 y.o. female  was evaluated in triage.  Pt complains of diarrhea.  She is brought in by daughter.  Daughter states that she was here on Wednesday for 12-1/2 hours before leaving without being seen by a provider.  States that the patient normally eats and drinks however the past 4 days has had limited p.o. intake.  She states that she is also having profuse diarrhea.  Has had innumerable amounts of diarrheal episodes.  Daughter does not note any blood.  She is unsure how often he is peeing.  She denies known fevers.  She also notes that she has been coughing recently.  She does complain of abdominal pain.  Daughter states that she is currently on antibiotics but is unsure for what.  Review of Systems  Positive: See above Negative:  Physical Exam  BP (!) 169/84 (BP Location: Right Arm)   Pulse 90   Resp 18   SpO2 96%  Gen:   Awake, no distress   Resp:  Normal effort  MSK:   Moves extremities without difficulty  Other:  Oropharynx is dry, skin dry.  Abdomen is soft rounded and tender to palpation in the lower quadrants.  Medical Decision Making  Medically screening exam initiated at 6:46 PM.  Appropriate orders placed.  Alexxis Mackert was informed that the remainder of the evaluation will be completed by another provider, this initial triage assessment does not replace that evaluation, and the importance of remaining in the ED until their evaluation is complete.     Mickie Hillier, PA-C 12/14/20 Chrissie Noa, MD 12/14/20 [phone]

## 2020-12-14 NOTE — ED Triage Notes (Addendum)
Pt brought in by daughter.  STates they were here on Wednesday and went home after 12.5 hour wait.  Daughter states pt is normally independent and has not been able to walk for a few days and has not eaten or drank for the same.  Endorses diarrhea as well. Daughter states they were unable to obtain labs on the patient the other day.

## 2020-12-15 ENCOUNTER — Inpatient Hospital Stay (HOSPITAL_COMMUNITY): Payer: Medicare Other

## 2020-12-15 ENCOUNTER — Emergency Department (HOSPITAL_COMMUNITY): Payer: Medicare Other

## 2020-12-15 DIAGNOSIS — E1122 Type 2 diabetes mellitus with diabetic chronic kidney disease: Secondary | ICD-10-CM | POA: Diagnosis present

## 2020-12-15 DIAGNOSIS — I700 Atherosclerosis of aorta: Secondary | ICD-10-CM | POA: Diagnosis present

## 2020-12-15 DIAGNOSIS — J9601 Acute respiratory failure with hypoxia: Secondary | ICD-10-CM | POA: Diagnosis not present

## 2020-12-15 DIAGNOSIS — K7460 Unspecified cirrhosis of liver: Secondary | ICD-10-CM | POA: Diagnosis present

## 2020-12-15 DIAGNOSIS — N179 Acute kidney failure, unspecified: Secondary | ICD-10-CM | POA: Diagnosis present

## 2020-12-15 DIAGNOSIS — I5030 Unspecified diastolic (congestive) heart failure: Secondary | ICD-10-CM | POA: Diagnosis not present

## 2020-12-15 DIAGNOSIS — I130 Hypertensive heart and chronic kidney disease with heart failure and stage 1 through stage 4 chronic kidney disease, or unspecified chronic kidney disease: Secondary | ICD-10-CM | POA: Diagnosis present

## 2020-12-15 DIAGNOSIS — E860 Dehydration: Secondary | ICD-10-CM | POA: Diagnosis present

## 2020-12-15 DIAGNOSIS — G9341 Metabolic encephalopathy: Secondary | ICD-10-CM | POA: Diagnosis present

## 2020-12-15 DIAGNOSIS — I358 Other nonrheumatic aortic valve disorders: Secondary | ICD-10-CM | POA: Diagnosis present

## 2020-12-15 DIAGNOSIS — M6282 Rhabdomyolysis: Secondary | ICD-10-CM | POA: Diagnosis present

## 2020-12-15 DIAGNOSIS — E1169 Type 2 diabetes mellitus with other specified complication: Secondary | ICD-10-CM | POA: Diagnosis present

## 2020-12-15 DIAGNOSIS — E872 Acidosis, unspecified: Secondary | ICD-10-CM | POA: Diagnosis present

## 2020-12-15 DIAGNOSIS — N184 Chronic kidney disease, stage 4 (severe): Secondary | ICD-10-CM | POA: Diagnosis present

## 2020-12-15 DIAGNOSIS — D631 Anemia in chronic kidney disease: Secondary | ICD-10-CM | POA: Diagnosis present

## 2020-12-15 DIAGNOSIS — R778 Other specified abnormalities of plasma proteins: Secondary | ICD-10-CM | POA: Diagnosis not present

## 2020-12-15 DIAGNOSIS — I25119 Atherosclerotic heart disease of native coronary artery with unspecified angina pectoris: Secondary | ICD-10-CM | POA: Diagnosis not present

## 2020-12-15 DIAGNOSIS — E1140 Type 2 diabetes mellitus with diabetic neuropathy, unspecified: Secondary | ICD-10-CM | POA: Diagnosis present

## 2020-12-15 DIAGNOSIS — F32A Depression, unspecified: Secondary | ICD-10-CM | POA: Diagnosis present

## 2020-12-15 DIAGNOSIS — Z20822 Contact with and (suspected) exposure to covid-19: Secondary | ICD-10-CM | POA: Diagnosis present

## 2020-12-15 DIAGNOSIS — Z515 Encounter for palliative care: Secondary | ICD-10-CM | POA: Diagnosis not present

## 2020-12-15 DIAGNOSIS — I5181 Takotsubo syndrome: Secondary | ICD-10-CM | POA: Diagnosis present

## 2020-12-15 DIAGNOSIS — L8990 Pressure ulcer of unspecified site, unspecified stage: Secondary | ICD-10-CM | POA: Insufficient documentation

## 2020-12-15 DIAGNOSIS — I509 Heart failure, unspecified: Secondary | ICD-10-CM

## 2020-12-15 DIAGNOSIS — R570 Cardiogenic shock: Secondary | ICD-10-CM

## 2020-12-15 DIAGNOSIS — R64 Cachexia: Secondary | ICD-10-CM | POA: Diagnosis present

## 2020-12-15 DIAGNOSIS — K529 Noninfective gastroenteritis and colitis, unspecified: Secondary | ICD-10-CM | POA: Diagnosis present

## 2020-12-15 DIAGNOSIS — I5032 Chronic diastolic (congestive) heart failure: Secondary | ICD-10-CM | POA: Diagnosis present

## 2020-12-15 DIAGNOSIS — E870 Hyperosmolality and hypernatremia: Secondary | ICD-10-CM | POA: Diagnosis present

## 2020-12-15 DIAGNOSIS — R627 Adult failure to thrive: Secondary | ICD-10-CM | POA: Diagnosis not present

## 2020-12-15 DIAGNOSIS — Z66 Do not resuscitate: Secondary | ICD-10-CM | POA: Diagnosis present

## 2020-12-15 DIAGNOSIS — K7200 Acute and subacute hepatic failure without coma: Secondary | ICD-10-CM | POA: Diagnosis present

## 2020-12-15 DIAGNOSIS — L89152 Pressure ulcer of sacral region, stage 2: Secondary | ICD-10-CM | POA: Diagnosis present

## 2020-12-15 LAB — MAGNESIUM: Magnesium: 3 mg/dL — ABNORMAL HIGH (ref 1.7–2.4)

## 2020-12-15 LAB — COMPREHENSIVE METABOLIC PANEL
ALT: 300 U/L — ABNORMAL HIGH (ref 0–44)
AST: 434 U/L — ABNORMAL HIGH (ref 15–41)
Albumin: 3.7 g/dL (ref 3.5–5.0)
Alkaline Phosphatase: 53 U/L (ref 38–126)
Anion gap: 21 — ABNORMAL HIGH (ref 5–15)
BUN: 136 mg/dL — ABNORMAL HIGH (ref 8–23)
CO2: 17 mmol/L — ABNORMAL LOW (ref 22–32)
Calcium: 9.5 mg/dL (ref 8.9–10.3)
Chloride: 108 mmol/L (ref 98–111)
Creatinine, Ser: 4.49 mg/dL — ABNORMAL HIGH (ref 0.44–1.00)
GFR, Estimated: 9 mL/min — ABNORMAL LOW (ref >60)
Glucose, Bld: 148 mg/dL — ABNORMAL HIGH (ref 70–99)
Potassium: 4.2 mmol/L (ref 3.5–5.1)
Sodium: 146 mmol/L — ABNORMAL HIGH (ref 135–145)
Total Bilirubin: 1.4 mg/dL — ABNORMAL HIGH (ref 0.3–1.2)
Total Protein: 7.6 g/dL (ref 6.5–8.1)

## 2020-12-15 LAB — URINALYSIS, ROUTINE W REFLEX MICROSCOPIC
Bilirubin Urine: NEGATIVE
Glucose, UA: NEGATIVE mg/dL
Ketones, ur: NEGATIVE mg/dL
Nitrite: NEGATIVE
Protein, ur: 100 mg/dL — AB
Specific Gravity, Urine: 1.015 (ref 1.005–1.030)
pH: 5 (ref 5.0–8.0)

## 2020-12-15 LAB — BRAIN NATRIURETIC PEPTIDE: B Natriuretic Peptide: >4500 pg/mL — ABNORMAL HIGH (ref 0.0–100.0)

## 2020-12-15 LAB — TROPONIN I (HIGH SENSITIVITY)
Troponin I (High Sensitivity): 302 ng/L (ref <18)
Troponin I (High Sensitivity): 392 ng/L (ref <18)

## 2020-12-15 LAB — PROTIME-INR
INR: 2.5 — ABNORMAL HIGH (ref 0.8–1.2)
Prothrombin Time: 26.6 seconds — ABNORMAL HIGH (ref 11.4–15.2)

## 2020-12-15 LAB — CK
Total CK: 521 U/L — ABNORMAL HIGH (ref 38–234)
Total CK: 674 U/L — ABNORMAL HIGH (ref 38–234)

## 2020-12-15 LAB — LACTIC ACID, PLASMA
Lactic Acid, Venous: 5.1 mmol/L (ref 0.5–1.9)
Lactic Acid, Venous: 5.8 mmol/L (ref 0.5–1.9)

## 2020-12-15 LAB — HEMOGLOBIN A1C
Hgb A1c MFr Bld: 5.9 % — ABNORMAL HIGH (ref 4.8–5.6)
Mean Plasma Glucose: 122.63 mg/dL

## 2020-12-15 LAB — PHOSPHORUS: Phosphorus: 9.4 mg/dL — ABNORMAL HIGH (ref 2.5–4.6)

## 2020-12-15 LAB — LIPASE, BLOOD: Lipase: 124 U/L — ABNORMAL HIGH (ref 11–51)

## 2020-12-15 LAB — AMMONIA: Ammonia: 40 umol/L — ABNORMAL HIGH (ref 9–35)

## 2020-12-15 MED ORDER — FOLIC ACID 1 MG PO TABS: 1.0000 mg | ORAL_TABLET | Freq: Every day | ORAL | Status: DC

## 2020-12-15 MED ORDER — HEPARIN (PORCINE) 25000 UT/250ML-% IV SOLN
600.0000 [IU]/h | INTRAVENOUS | Status: DC
  Administered 2020-12-15: 600 [IU]/h via INTRAVENOUS
  Filled 2020-12-15: qty 250

## 2020-12-15 MED ORDER — SODIUM CHLORIDE 0.9 % IV SOLN
1000.0000 mL | INTRAVENOUS | Status: DC
  Administered 2020-12-15: 1000 mL via INTRAVENOUS

## 2020-12-15 MED ORDER — ACETAMINOPHEN 325 MG PO TABS: 650.0000 mg | ORAL_TABLET | Freq: Four times a day (QID) | ORAL | Status: DC | PRN

## 2020-12-15 MED ORDER — THIAMINE HCL 100 MG PO TABS: 100.0000 mg | ORAL_TABLET | Freq: Every day | ORAL | Status: DC

## 2020-12-15 MED ORDER — DOBUTAMINE IN D5W 4-5 MG/ML-% IV SOLN
5.0000 ug/kg/min | INTRAVENOUS | Status: DC
  Administered 2020-12-16: 5 ug/kg/min via INTRAVENOUS
  Filled 2020-12-15: qty 250

## 2020-12-15 MED ORDER — ACETAMINOPHEN 650 MG RE SUPP: 650.0000 mg | Freq: Four times a day (QID) | RECTAL | Status: DC | PRN

## 2020-12-15 MED ORDER — CLOPIDOGREL BISULFATE 75 MG PO TABS: 75.0000 mg | ORAL_TABLET | Freq: Every day | ORAL | Status: DC

## 2020-12-15 MED ORDER — LATANOPROST 0.005 % OP SOLN
1.0000 [drp] | Freq: Every day | OPHTHALMIC | Status: DC
  Administered 2020-12-16 – 2020-12-17 (×3): 1 [drp] via OPHTHALMIC
  Filled 2020-12-15: qty 2.5

## 2020-12-15 MED ORDER — SODIUM CHLORIDE 0.9 % IV BOLUS (SEPSIS)
1000.0000 mL | Freq: Once | INTRAVENOUS | Status: AC
  Administered 2020-12-15: 1000 mL via INTRAVENOUS

## 2020-12-15 MED ORDER — ADULT MULTIVITAMIN W/MINERALS CH: 1.0000 | ORAL_TABLET | Freq: Every day | ORAL | Status: DC

## 2020-12-15 MED ORDER — LABETALOL HCL 5 MG/ML IV SOLN
20.0000 mg | Freq: Once | INTRAVENOUS | Status: AC
  Administered 2020-12-15: 20 mg via INTRAVENOUS
  Filled 2020-12-15: qty 4

## 2020-12-15 MED ORDER — LACTATED RINGERS IV SOLN
INTRAVENOUS | Status: DC
Start: 2020-12-15 — End: 2020-12-15

## 2020-12-15 MED ORDER — HEPARIN SODIUM (PORCINE) 5000 UNIT/ML IJ SOLN: 5000.0000 [IU] | Freq: Three times a day (TID) | INTRAMUSCULAR | Status: DC

## 2020-12-15 MED ORDER — THIAMINE HCL 100 MG/ML IJ SOLN
100.0000 mg | Freq: Every day | INTRAMUSCULAR | Status: DC
  Administered 2020-12-17: 100 mg via INTRAVENOUS
  Filled 2020-12-15 (×2): qty 2

## 2020-12-15 MED ORDER — ALBUTEROL SULFATE (2.5 MG/3ML) 0.083% IN NEBU: 3.0000 mL | INHALATION_SOLUTION | RESPIRATORY_TRACT | Status: DC | PRN

## 2020-12-15 MED ORDER — ASPIRIN 325 MG PO TABS
325.0000 mg | ORAL_TABLET | Freq: Every day | ORAL | Status: DC
  Administered 2020-12-16: 325 mg via ORAL
  Filled 2020-12-15: qty 1

## 2020-12-15 NOTE — ED Notes (Signed)
Patient in CT

## 2020-12-15 NOTE — Consult Note (Addendum)
Cardiology Consultation:   Patient ID: [name] MRN: [id number]; DOB: 07-11-1932  Admit date: 12/14/2020 Date of Consult: 12/16/2020  Primary Care Provider: Dierdre Harness, [facility] Cardiologist: Sinclair Grooms, MD  [facility] Electrophysiologist:  None   Patient Profile:   [name] is a 87 y.o. female with CAD, unstable angina, HTN, HLD, CKD3, and DM2 who is being seen today for the evaluation of elevated troponin.   History of Present Illness:   [name] was initially brought in on 12/12/2020 by her granddaughter for weakness, poor p.o. intake, and shortness of breath for 3 days duration along with significant diarrhea.  The patient left without being seen secondary to prolonged wait times.  She was brought back to the ED on 12/02/2 and had not been able to walk for several days along with limited ability to eat/drink and profuse diarrhea.  She denied any blood in stool or other sick contacts.  She has not had any fevers and had no URI symptoms.  Blood pressure 169/84, P 90, RR 18, O2 96% on initial admission.  Lab work revealed significant AKI and transaminitis with CT A/P wo without etiology for symptoms.  She also had a renal ultrasound which did not show any hydronephrosis.  She denied any chest pain but was somnolent and troponins were checked which were found to be elevated (392->302) with prior levels in July much lower (50->51).  ECG without significant new ischemic changes.  Lab abnormalities also significant for elevated BNP (>4500, prior [phone]) and lactic acidosis (5.1->5.8).   During evaluation [name] was responsive but somnolent and only knew her name without orientation to place or time. VS with HTN (178/85, m 105), P 74, O2 99% RA, RR 12.   Past Medical History:  Diagnosis Date   Anemia 04/03/2011   Anginal pain ([place])    a. 02/2014 MV: Low risk; b. 05/2017 MV: small area of inferior basal ischemia. EF 54% - low risk-->Med  mgmt.   Anxiety    Aortic valve sclerosis w/ systolic murmur    a. 62/3762 AoV sclerosis w/o stenosis.   Arthritis    "feet" (06/02/2017)   Blind left eye 1999   CKD (chronic kidney disease) stage 3, GFR 30-59 ml/min (HCC)    Depression    Diastolic dysfunction    a. 10/2018 Echo: EF 60-65%, mild septal/posterior LVH. Impaired relaxation. Nl RV size/fxn. Mod dil LA. Trace TR. Triv AI. Mod AoV sclerosis w/o stenosis. Nl PASP.   Exertional dyspnea    GERD (gastroesophageal reflux disease)    High cholesterol    Hip fracture ([place]) 07/17/11   fall from 1-2 feet; left   Hypertension    Ischemic stroke ([place]) 05/2017   a. 06/2017 Holter: RSR, PACs, no afib.   Prosthetic eye globe 1999   "from diabetic; left eye"   Type II diabetes mellitus ([place])    not taking any medication (06/02/2017)   Past Surgical History:  Procedure Laterality Date   ENUCLEATION Left    FRACTURE SURGERY     HIP ARTHROPLASTY  07/18/2011   Procedure: ARTHROPLASTY BIPOLAR HIP;  Surgeon: Nita Sells, MD;  Location: [facility];  Service: Orthopedics;  Laterality: Left;   INTRAOCULAR PROSTHESES INSERTION  1999   left   LEFT HEART CATH AND CORS/GRAFTS ANGIOGRAPHY N/A 04/08/2019   Procedure: LEFT HEART CATH AND CORS/GRAFTS ANGIOGRAPHY;  Surgeon: Minna Merritts, MD;  Location: [facility];  Service: Cardiovascular;  Laterality: N/A;  TONGUE BIOPSY  1998   "cancer"   TUBAL LIGATION  1970's    Home Medications:  Prior to Admission medications   Medication Sig Start Date End Date Taking? Authorizing Provider  acetaminophen (TYLENOL) 325 MG tablet Take 2 tablets (650 mg total) by mouth every 6 (six) hours as needed for mild pain or fever (or temp > 37.5 C (99.5 F)). 06/05/17  Yes Elgergawy, Silver Huguenin, MD  albuterol (VENTOLIN HFA) 108 (90 Base) MCG/ACT inhaler Inhale 2 puffs into the lungs every 6 (six) hours as needed for wheezing or shortness of breath.   Yes [provider]  atorvastatin (LIPITOR) 40  MG tablet Take 1 tablet (40 mg total) by mouth daily at 6 PM. Patient taking differently: Take 40 mg by mouth at bedtime. 06/08/19  Yes Pahwani, Einar Grad, MD  clopidogrel (PLAVIX) 75 MG tablet Take 75 mg by mouth daily.   Yes [provider]  furosemide (LASIX) 40 MG tablet Take 40 mg by mouth.   Yes [provider]  isosorbide mononitrate (IMDUR) 60 MG 24 hr tablet TAKE 1 TABLET BY MOUTH  DAILY Patient taking differently: 60 mg daily. 12/03/20  Yes Belva Crome, MD  latanoprost (XALATAN) 0.005 % ophthalmic solution Place 1 drop into the right eye at bedtime. 05/07/20  Yes [provider]  Multiple Vitamins-Minerals (ONE-A-DAY WOMENS 50+) TABS Take 1 tablet by mouth daily with breakfast.   Yes [provider]  neomycin-polymyxin-hydrocortisone (CORTISPORIN) 3.5-10000-1 OTIC suspension Place 2-3 drops into the right ear at bedtime.   Yes [provider]  nitroGLYCERIN (NITROSTAT) 0.4 MG SL tablet Place 1 tablet (0.4 mg total) under the tongue every 5 (five) minutes as needed for chest pain. 02/11/19 12/15/20 Yes Isaiah Serge, NP  amLODipine (NORVASC) 10 MG tablet Take 1 tablet (10 mg total) by mouth daily. Patient not taking: Reported on 12/15/2020 06/09/19 11/24/19  Darliss Cheney, MD  carvedilol (COREG) 12.5 MG tablet Take 1 tablet (12.5 mg total) by mouth 2 (two) times daily with a meal. Patient not taking: Reported on 12/15/2020 06/08/19 11/24/19  Darliss Cheney, MD  folic acid (FOLVITE) 1 MG tablet Take 1 tablet (1 mg total) by mouth daily. Patient not taking: Reported on 05/21/2020 06/06/17   Elgergawy, Silver Huguenin, MD  gabapentin (NEURONTIN) 300 MG capsule Take 1 capsule (300 mg total) by mouth daily. Patient taking differently: Take 300 mg by mouth 3 (three) times daily. 05/25/20   Armando Reichert, MD  guaiFENesin (MUCINEX) 600 MG 12 hr tablet Take 1 tablet (600 mg total) by mouth 2 (two) times daily as needed for to loosen phlegm. Patient not taking: Reported on  12/15/2020 05/24/20   Armando Reichert, MD  hydrALAZINE (APRESOLINE) 10 MG tablet TAKE 1 TABLET(10 MG) BY MOUTH THREE TIMES DAILY Patient taking differently: Take 10 mg by mouth 3 (three) times daily. 08/01/19   Belva Crome, MD  nicotine (NICODERM CQ - DOSED IN MG/24 HR) 7 mg/24hr patch Place 1 patch (7 mg total) onto the skin daily. Patient not taking: Reported on 12/15/2020 05/25/20   Armando Reichert, MD  omeprazole (PRILOSEC) 20 MG capsule TAKE 1 CAPSULE BY MOUTH  DAILY Patient not taking: Reported on 12/15/2020 09/04/20   Isaiah Serge, NP    Inpatient Medications: Scheduled Meds:  aspirin  325 mg Oral Daily   folic acid  1 mg Oral Daily   latanoprost  1 drop Right Eye QHS   multivitamin with minerals  1 tablet Oral Daily  thiamine  100 mg Oral Daily   Or   thiamine  100 mg Intravenous Daily   Continuous Infusions:  DOBUTamine 5 mcg/kg/min (12/16/20 0004)   heparin 600 Units/hr (12/15/20 2312)   PRN Meds: albuterol  Allergies:    Allergies  Allergen Reactions   Tape Other (See Comments)    SKIN IS SENSITIVE   Social History:   Social History   Socioeconomic History   Marital status: Single    Spouse name: Not on file   Number of children: Not on file   Years of education: Not on file   Highest education level: Not on file  Occupational History   Not on file  Tobacco Use   Smoking status: Former    Packs/day: 1.50    Years: 47.00    Pack years: 70.50    Types: Cigarettes    Quit date: 09/13/1997    Years since quitting: 23.2   Smokeless tobacco: Never  Vaping Use   Vaping Use: Never used  Substance and Sexual Activity   Alcohol use: Not Currently    Alcohol/week: 17.0 standard drinks    Types: 17 Shots of liquor per week    Comment: Quit 03/2018   Drug use: No    Types: Marijuana, Heroin    Comment: 06/02/2017  "last drug use was in the 1980s." Prev injected heroin.   Sexual activity: Not Currently  Other Topics Concern   Not on file  Social History Narrative    Originally from [place] but moved to [place] [place] @ age 48.  Moved to [place] ~ 2011 to be near grandchildren.   Social Determinants of Health   Financial Resource Strain: Not on file  Food Insecurity: Not on file  Transportation Needs: Not on file  Physical Activity: Not on file  Stress: Not on file  Social Connections: Not on file  Intimate Partner Violence: Not on file    Family History:    Family History  Problem Relation Age of Onset   Diabetes type II Mother    Diabetes type II Father    Diabetes type II Sister    Colon cancer Neg Hx     ROS:  Review of Systems: [y] = yes, [ ]  = no      General: Weight gain [ ] ; Weight loss [ ] ; Anorexia [ ] ; Fatigue [ ] ; Fever [ ] ; Chills [ ] ; Weakness [ ]    Cardiac: Chest pain/pressure [ ] ; Resting SOB [ ] ; Exertional SOB [ ] ; Orthopnea [ ] ; Pedal Edema [ ] ; Palpitations [ ] ; Syncope [ ] ; Presyncope [ ] ; Paroxysmal nocturnal dyspnea [ ]    Pulmonary: Cough [ ] ; Wheezing [ ] ; Hemoptysis [ ] ; Sputum [ ] ; Snoring [ ]    GI: Vomiting [ ] ; Dysphagia [ ] ; Melena [ ] ; Hematochezia [ ] ; Heartburn [ ] ; Abdominal pain [ ] ; Constipation [ ] ; Diarrhea [y]; BRBPR [ ]    GU: Hematuria [ ] ; Dysuria [ ] ; Nocturia [ ]  Vascular: Pain in legs with walking [ ] ; Pain in feet with lying flat [ ] ; Non-healing sores [ ] ; Stroke [ ] ; TIA [ ] ; Slurred speech [ ] ;   Neuro: Headaches [ ] ; Vertigo [ ] ; Seizures [ ] ; Paresthesias [ ] ;Blurred vision [ ] ; Diplopia [ ] ; Vision changes [ ]    Ortho/Skin: Arthritis [ ] ; Joint pain [ ] ; Muscle pain [ ] ; Joint swelling [ ] ; Back Pain [ ] ; Rash [ ]    Psych: Depression [ ] ; Anxiety [ ]   Heme: Bleeding problems [ ] ; Clotting disorders [ ] ; Anemia [ ]    Endocrine: Diabetes [ ] ; Thyroid dysfunction [ ]    Physical Exam/Data:   Vitals:   12/15/20 1940 12/15/20 2100 12/15/20 2304 12/16/20 0405  BP: (!) 178/85  (!) 157/75 (!) 155/60  Pulse: 74  77 71  Resp: 20  20 17   Temp: (!) 97.5 F (36.4 C)  (!) 97.5 F (36.4 C) (!) 97.5 F  (36.4 C)  TempSrc: Oral  Oral Oral  SpO2: 100%  98% 100%  Weight:  58.4 kg     No intake or output data in the 24 hours ending 12/16/20 0604 Last 3 Weights 12/15/2020 07/13/2020 05/24/2020  Weight (lbs) 128 lb 12 oz 124 lb 12.5 oz 124 lb 12.5 oz  Weight (kg) 58.4 kg 56.6 kg 56.6 kg     Body mass index is 25.14 kg/m.  General: ill appearing, minimally responsive HEENT: normal Lymph: no adenopathy Neck: no JVD Endocrine:  No thryomegaly Vascular: No carotid bruits; FA pulses 2+ bilaterally without bruits  Cardiac:  normal S1, S2; RRR; no murmur  Lungs:  clear to auscultation bilaterally, no wheezing, rhonchi or rales  Abd: soft, nontender, no hepatomegaly  Ext: no edema Musculoskeletal:  No deformities, BUE and BLE strength normal and equal Skin: cold LE to mid calf Neuro:  no FNDs although somewhat difficult to assess with AMS Psych:  AMS  EKG:  The EKG was personally reviewed and demonstrates: (12/15/20, 20:48:01), NSR 76, PR 140, QRS 72, Qtc 468 Telemetry:  Telemetry was personally reviewed and demonstrates: NSR  Relevant CV Studies:  TTE Result date: 05/22/20  1. Left ventricular ejection fraction, by estimation, is 50 to 55%. Left  ventricular ejection fraction by 3D volume is 49 %. The left ventricle has  low normal function. The left ventricle demonstrates regional wall motion  abnormalities (see scoring  diagram/findings for description). Left ventricular diastolic parameters  are consistent with Grade I diastolic dysfunction (impaired relaxation).  Elevated left atrial pressure. There is mild hypokinesis of the left  ventricular, basal inferoseptal wall,  inferior wall and inferolateral wall.   2. Right ventricular systolic function is normal. The right ventricular  size is normal. There is normal pulmonary artery systolic pressure.   3. Left atrial size was mildly dilated.   4. The mitral valve is normal in structure. Trivial mitral valve  regurgitation. No  evidence of mitral stenosis. Moderate mitral annular  calcification.   5. The aortic valve is tricuspid. There is mild calcification of the  aortic valve. There is moderate thickening of the aortic valve. Aortic  valve regurgitation is mild. Mild aortic valve stenosis.   6. The inferior vena cava is dilated in size with <50% respiratory  variability, suggesting right atrial pressure of 15 mmHg.   Comparison(s): No significant change from prior study. Prior images  reviewed side by side.   Laboratory Data:  High Sensitivity Troponin:   Recent Labs  Lab 12/15/20 1851 12/15/20 2249  TROPONINIHS 302* 392*     Chemistry Recent Labs  Lab 12/15/20 0722  NA 146*  K 4.2  CL 108  CO2 17*  GLUCOSE 148*  BUN 136*  CREATININE 4.49*  CALCIUM 9.5  GFRNONAA 9*  ANIONGAP 21*    Recent Labs  Lab 12/15/20 0722  PROT 7.6  ALBUMIN 3.7  AST 434*  ALT 300*  ALKPHOS 53  BILITOT 1.4*   Hematology Recent Labs  Lab 12/12/20 1536 12/14/20 1846  WBC 8.4 9.0  RBC 3.65* 3.92  HGB 10.6* 11.3*  HCT 32.7* 35.2*  MCV 89.6 89.8  MCH 29.0 28.8  MCHC 32.4 32.1  RDW 16.5* 16.5*  PLT 259 198   BNP Recent Labs  Lab 12/15/20 1851  BNP >4,500.0*    DDimer No results for input(s): DDIMER in the last 168 hours.  Radiology/Studies:  CT ABDOMEN PELVIS WO CONTRAST  Result Date: 12/15/2020 CLINICAL DATA:  Abdominal pain and vomiting. EXAM: CT ABDOMEN AND PELVIS WITHOUT CONTRAST TECHNIQUE: Multidetector CT imaging of the abdomen and pelvis was performed following the standard protocol without IV contrast. COMPARISON:  July 10, 2014 FINDINGS: Lower chest: Enlarged heart. Calcific atherosclerotic disease of the aorta and coronary arteries. Bilateral small pleural effusions. Mild interstitial edema. Hepatobiliary: Normal nonenhanced appearance of the liver. Layering gallstones or sludge in the dependent portion of the gallbladder. Pancreas: Unremarkable. No pancreatic ductal dilatation or  surrounding inflammatory changes. Spleen: Small spleen. Adrenals/Urinary Tract: No adrenal masses. 1.4 cm hyperdense right renal mass in the lower pole of the right kidney. No evidence of hydronephrosis. Normal urinary bladder. Stomach/Bowel: Stomach is within normal limits. No evidence of appendicitis. No evidence of bowel wall thickening, distention, or inflammatory changes. Vascular/Lymphatic: Heavy atherosclerosis and tortuosity of the aorta and its main branches. No enlarged abdominal or pelvic lymph nodes. Reproductive: 5.7 cm fat density pelvic mass, stable. Other: Fat containing periumbilical anterior abdominal wall hernia. Musculoskeletal: Mild superior endplate compression deformity of T12 vertebral body, stable. IMPRESSION: 1. No evidence of acute abnormalities within the solid abdominal organs. 2. 1.4 cm hyperdense right renal mass in the lower pole of the right kidney. This may represent a hemorrhagic or proteinaceous cyst, however renal cell carcinoma cannot be excluded. 3. Enlarged heart with mild interstitial edema and bilateral small pleural effusions. 4. Anasarca. 5. Calcific atherosclerotic disease of the aorta and coronary arteries. 6. Fat containing periumbilical anterior abdominal wall hernia. 7. Mild superior endplate compression deformity of T12 vertebral body, stable. Aortic Atherosclerosis (ICD10-I70.0). Electronically Signed   By: Fidela Salisbury M.D.   On: 12/15/2020 13:51   DG Chest 2 View  Result Date: 12/12/2020 CLINICAL DATA:  Shortness of breath. EXAM: CHEST - 2 VIEW COMPARISON:  09/18/2020 and CT chest 11/23/2019. FINDINGS: Trachea is midline. Heart is enlarged. Thoracic aorta is calcified. Probable streaky scarring in the medial lung bases. Lungs are otherwise clear. Trace bilateral pleural effusions. Degenerative changes in the spine. IMPRESSION: Trace bilateral pleural effusions. Electronically Signed   By: Lorin Picket M.D.   On: 12/12/2020 16:00   US  RENAL  Result Date: 12/15/2020 CLINICAL DATA:  1.4 cm hyperdense cyst or mass right kidney on CT today. Hydronephrosis. EXAM: RENAL / URINARY TRACT ULTRASOUND COMPLETE COMPARISON:  CT earlier today, renal ultrasound 04/10/2019. FINDINGS: Right Kidney: Renal measurements: 7.6 x 3.8 x 4.0 cm = volume: 59.9 mL. There is normal cortical echogenicity with mild general cortical thinning. Corresponding to the hyperdense lesion on CT, there is a 1.6 cm simple cyst in the lateral mid pole area. There is no suspicious sonographic mass, no calculus or hydronephrosis is seen. Left Kidney: Renal measurements: 9.0 x 4.6 x 4.5 cm = volume: 95.5 mL. Echogenicity within normal limits. No mass, stone or hydronephrosis visualized. Bladder: Appears normal for degree of bladder distention. Bladder volume is 104.46 mL. Ureteral jets were not seen. Other: No perinephric or pelvic free fluid. IMPRESSION: 1. No increased cortical echogenicity. Mild cortical volume loss on the right. 2. 1.6 cm simple cyst right kidney corresponding to hyperdense  lesion on CT. 3. Unremarkable bladder aside from ureteral jets not being seen. No hydronephrosis. Electronically Signed   By: Telford Nab M.D.   On: 12/15/2020 22:39   DG CHEST PORT 1 VIEW  Result Date: 12/15/2020 CLINICAL DATA:  Increased weakness, poor p.o. intake, shortness of breath for 3 days. EXAM: PORTABLE CHEST 1 VIEW COMPARISON:  Chest x-rays dated 12/12/2020 and 09/18/2020. FINDINGS: Stable cardiomegaly. Lungs are clear. No pleural effusion or pneumothorax is seen. Osseous structures about the chest are unremarkable. IMPRESSION: 1. No active disease. No evidence of pneumonia. 2. Stable cardiomegaly. Electronically Signed   By: Franki Cabot M.D.   On: 12/15/2020 16:44    Assessment and Plan:   Cardiogenic shock Low output HF Stress induced CM  [name] has unfortunately had a significant clinical decline over the past week.  Before that she was very functional per her  granddaughter's report is still little better making all her meals and getting around with walker assistance.  At Thanksgiving she dressed up and was very peppy around her family.  She has never driven but per her granddaughter more as a very good quality of life at home.  She initially had severe diarrhea from some form of gastroenteritis which has since resolved however I think her current evaluation is most consistent with a stress-induced cardiomyopathy following her initial decompensation.  Her exam is consistent with low output heart failure but she does not appear to be grossly volume overloaded.  BNP was presumably checked after 2 L fluid bolus in the ED.  Bedside echo with severely reduced EF with global systolic dysfunction which is significantly worsened from her May echo when I reviewed the images.  She seems to have some basal preservation of function on apical windows that would be consistent with a stress-induced cardiomyopathy although ACS is not completely ruled out given elevated troponin and limited symptoms per patient.  Along with this her IVC is 2.2 cm but collapses with respiratory inspiration.  She only had 130 cc urine on bladder scan and has had no urine output since admission.  Her LFTs reflect shock liver and her AKI on CKD reflects poor renal function fusion.  Her altered mental status is also consistent with this.  I reviewed her prior discussions regarding goals of care and it is clear that she is DNAR.  Although her granddaughter does not have official [facility] POA, there are 4 daughters and 1 son who defer medical decision making to her per her report and she has the mediator as the children do not go along for long periods of time.  There is 1 daughter in [place], [place], [place], and the [place].  1 son lives locally as well in [place].  Everyone except the daughter that lives in [place] can come on 12/16/2020.  We discussed escalation of care whether this was within  her goals of care.  Per her granddaughter's discussion she thought she would want short-term attempt at inotrope therapy to see if any of this was reversible.  She understands the severity of the current situation and that there is high likelihood that she may not survive this hospitalization.  With that said she was very functional only a little over a week ago and if we were able to get her through this acute period she could have some recovery of liver/kidney function and improvement in mental status although the next 12-24 hours will really dictate her chance of this.  Nephrology has evaluated her and  she is unlikely to be dialysis candidate with multiorgan failure.  She does not appear to be grossly volume overloaded and I think this is predominantly low flow cardiogenic shock.  She is also hypertensive and her afterload treatments were discontinued on admission (imdur/hydral).  If we see improvement in the lactic acidosis with addition of dobutamine then we could consider addition of hydralazine low-dose for afterload reduction since we are planning a blood pressure regimen.  For now I would continue to trend lactates every 6 hours and continue on dobutamine at 5 mcg.  Her granddaughter was clear that she would not want continued escalation of care to ICU setting with any additional invasive lines or monitoring.  I would not offer additional inotrope therapy given her extremely guarded prognosis.  I do not think that this is primary ACS and even if so given her current clinical status and goals of care I do not think invasive angiography would be reasonable.  We can continue a short course of dobutamine to see if any improvement. TTE ordered and pending.   For questions or updates, please contact [place] Please consult [email or web address] for contact info under   Signed, Dion Body, MD  12/16/2020 6:04 AM

## 2020-12-15 NOTE — Consult Note (Signed)
Renal Service Consult Note [facility]  Chani Ghanem 12/15/2020 Sol Blazing, MD Requesting Physician: DR Dorian Pod  Reason for Consult: Renal failure HPI: The patient is a 87 y.o. year-old w/ hx of anemia, CAD, CKD, depression, HL, HTN, CVA, DM2 who presented to ED w/ c/o diarrhea and abdominal pain yesterday 12/02. Pt found on the floor by her family member. Pt had not eaten in several days and stopped taking her medications as well. Also confused. At baseline does some cooking and bathes herself. In ED BP's were high, HR 70-0, RR 14-21, afebrile.  Pt confused. Labs showed ^BNP 4500, trop 302, CPK 521, creat 4.5 (baseline 1.6- 2.4). BUN 136. Alb 3.7. Lipase 124, AST 434, ALT 300, Tbili 1.4, INR 2.5.  Hb 11.3, WBC 9K, normal plts. CXR clear no edema. Abdomen CT showed normal appearing kidneys. R renal mass, not large, medium size. We are asked to see for renal failure.    Pt seen in room.  She is in mittens and restraints, doesn't provide any history.   ROS - n/a    Past Medical History  Past Medical History:  Diagnosis Date   Anemia 04/03/2011   Anginal pain ([place])    a. 02/2014 MV: Low risk; b. 05/2017 MV: small area of inferior basal ischemia. EF 54% - low risk-->Med mgmt.   Anxiety    Aortic valve sclerosis w/ systolic murmur    a. 29/2446 AoV sclerosis w/o stenosis.   Arthritis    "feet" (06/02/2017)   Blind left eye 1999   CKD (chronic kidney disease) stage 3, GFR 30-59 ml/min (HCC)    Depression    Diastolic dysfunction    a. 10/2018 Echo: EF 60-65%, mild septal/posterior LVH. Impaired relaxation. Nl RV size/fxn. Mod dil LA. Trace TR. Triv AI. Mod AoV sclerosis w/o stenosis. Nl PASP.   Exertional dyspnea    GERD (gastroesophageal reflux disease)    High cholesterol    Hip fracture ([place]) 07/17/11   fall from 1-2 feet; left   Hypertension    Ischemic stroke ([place]) 05/2017   a. 06/2017 Holter: RSR, PACs, no afib.   Prosthetic eye globe 1999   "from  diabetic; left eye"   Type II diabetes mellitus ([place])    not taking any medication (06/02/2017)   Past Surgical History  Past Surgical History:  Procedure Laterality Date   ENUCLEATION Left    FRACTURE SURGERY     HIP ARTHROPLASTY  07/18/2011   Procedure: ARTHROPLASTY BIPOLAR HIP;  Surgeon: Nita Sells, MD;  Location: [place];  Service: Orthopedics;  Laterality: Left;   INTRAOCULAR PROSTHESES INSERTION  1999   left   LEFT HEART CATH AND CORS/GRAFTS ANGIOGRAPHY N/A 04/08/2019   Procedure: LEFT HEART CATH AND CORS/GRAFTS ANGIOGRAPHY;  Surgeon: Minna Merritts, MD;  Location: [place] [facility];  Service: Cardiovascular;  Laterality: N/A;   TONGUE BIOPSY  1998   "cancer"   TUBAL LIGATION  1970's   Family History  Family History  Problem Relation Age of Onset   Diabetes type II Mother    Diabetes type II Father    Diabetes type II Sister    Colon cancer Neg Hx    Social History  reports that she quit smoking about 23 years ago. Her smoking use included cigarettes. She has a 70.50 pack-year smoking history. She has never used smokeless tobacco. She reports that she does not currently use alcohol after a past usage of about 17.0 standard drinks per week.  She reports that she does not use drugs. Allergies  Allergies  Allergen Reactions   Tape Other (See Comments)    SKIN IS SENSITIVE   Home medications Prior to Admission medications   Medication Sig Start Date End Date Taking? Authorizing Provider  acetaminophen (TYLENOL) 325 MG tablet Take 2 tablets (650 mg total) by mouth every 6 (six) hours as needed for mild pain or fever (or temp > 37.5 C (99.5 F)). 06/05/17  Yes Elgergawy, Silver Huguenin, MD  albuterol (VENTOLIN HFA) 108 (90 Base) MCG/ACT inhaler Inhale 2 puffs into the lungs every 6 (six) hours as needed for wheezing or shortness of breath.   Yes [provider]  atorvastatin (LIPITOR) 40 MG tablet Take 1 tablet (40 mg total) by mouth daily at 6 PM. Patient taking  differently: Take 40 mg by mouth at bedtime. 06/08/19  Yes Pahwani, Einar Grad, MD  clopidogrel (PLAVIX) 75 MG tablet Take 75 mg by mouth daily.   Yes [provider]  furosemide (LASIX) 40 MG tablet Take 40 mg by mouth.   Yes [provider]  isosorbide mononitrate (IMDUR) 60 MG 24 hr tablet TAKE 1 TABLET BY MOUTH  DAILY Patient taking differently: 60 mg daily. 12/03/20  Yes Belva Crome, MD  latanoprost (XALATAN) 0.005 % ophthalmic solution Place 1 drop into the right eye at bedtime. 05/07/20  Yes [provider]  Multiple Vitamins-Minerals (ONE-A-DAY WOMENS 50+) TABS Take 1 tablet by mouth daily with breakfast.   Yes [provider]  neomycin-polymyxin-hydrocortisone (CORTISPORIN) 3.5-10000-1 OTIC suspension Place 2-3 drops into the right ear at bedtime.   Yes [provider]  nitroGLYCERIN (NITROSTAT) 0.4 MG SL tablet Place 1 tablet (0.4 mg total) under the tongue every 5 (five) minutes as needed for chest pain. 02/11/19 12/15/20 Yes Isaiah Serge, NP  amLODipine (NORVASC) 10 MG tablet Take 1 tablet (10 mg total) by mouth daily. Patient not taking: Reported on 12/15/2020 06/09/19 11/24/19  Darliss Cheney, MD  carvedilol (COREG) 12.5 MG tablet Take 1 tablet (12.5 mg total) by mouth 2 (two) times daily with a meal. Patient not taking: Reported on 12/15/2020 06/08/19 11/24/19  Darliss Cheney, MD  folic acid (FOLVITE) 1 MG tablet Take 1 tablet (1 mg total) by mouth daily. Patient not taking: Reported on 05/21/2020 06/06/17   Elgergawy, Silver Huguenin, MD  gabapentin (NEURONTIN) 300 MG capsule Take 1 capsule (300 mg total) by mouth daily. Patient taking differently: Take 300 mg by mouth 3 (three) times daily. 05/25/20   Armando Reichert, MD  guaiFENesin (MUCINEX) 600 MG 12 hr tablet Take 1 tablet (600 mg total) by mouth 2 (two) times daily as needed for to loosen phlegm. Patient not taking: Reported on 12/15/2020 05/24/20   Armando Reichert, MD  hydrALAZINE (APRESOLINE) 10 MG tablet  TAKE 1 TABLET(10 MG) BY MOUTH THREE TIMES DAILY Patient taking differently: Take 10 mg by mouth 3 (three) times daily. 08/01/19   Belva Crome, MD  nicotine (NICODERM CQ - DOSED IN MG/24 HR) 7 mg/24hr patch Place 1 patch (7 mg total) onto the skin daily. Patient not taking: Reported on 12/15/2020 05/25/20   Armando Reichert, MD  omeprazole (PRILOSEC) 20 MG capsule TAKE 1 CAPSULE BY MOUTH  DAILY Patient not taking: Reported on 12/15/2020 09/04/20   Isaiah Serge, NP     Vitals:   12/15/20 1630 12/15/20 1700 12/15/20 1749 12/15/20 1940  BP: (!) 160/77 (!) 156/77 (!) 153/83 (!) 178/85  Pulse: 72 73 83 74  Resp: Marland Kitchen)  27 (!) 21 16 20   Temp:  98.3 F (36.8 C)  (!) 97.5 F (36.4 C)  TempSrc:  Oral  Oral  SpO2: 96% 98% 100% 100%   Exam Gen lethargic but arouses easily, tries to answer simple questions No rash, cyanosis or gangrene Sclera anicteric, throat clear  No jvd or bruits Chest rales L base, R clear , good air sounds RRR no MRG Abd soft ntnd no mass or ascites +bs GU defer MS no joint effusions or deformity Ext no LE or UE edema, no wounds or ulcers, skin turgor poorish Neuro is oriented x 2 to self and "[place]", not to year, nonfocal  Gen'd weakness and deconditioning    Home meds include - plavix, eyedrops, lipitor, lasix 40 qd, sl ntg prn, norvasc, coreg 12.5 bid, neurontin 300 tid, hydralazine 10 tid, prilosec, prns/ vits / supps     Date   Creat  eGFR   2008- 2015  0.90- 1.11    2016- 2017  1.02- 1.40   2018- 2019  0.91- 1.56   Jan- feb 2020 1.31- 1.93   Oct -dec 2020 1.47- 3.01   Jan -may 2021 1.46- 2.44   Nov 2021  1.88 >> 1.35 AKI episode   May 2022  1.81- 2.76 19- 27 ml/min   July 2022  2.02  24 ml/min    Dec 15 2020  4.49  9 ml/min        CT abdomen - IMPRESSION: 1.4 cm hyperdense right renal mass...may represent a hemorrhagic or proteinaceous cyst, although renal cell carcinoma cannot be excluded. Enlarged heart with mild interstitial edema and bilateral  small pleural effusions. Anasarca. Calcific atherosclerotic disease of the aorta and coronary arteries.     Last UA 07/13/20 = > 300 prot, mod LE 0-5 rbc, >50 wbc     Abd CT > Urinary Tract: 1.4 cm hyperdense right renal mass in the lower pole of the right kidney. No evidence of hydronephrosis. Normal urinary bladder.      BP's here 150- 198/ 75- 95   HR 88 RR 12-22, 2-3 L O2 [place]       CXR - no edema, possibly very small effusions        Last ECHO May 2022 - LVEF 50-55%        Heart cath march 2021 - severe 100% RCA occlusion, unable to pass a wire, other lesions only 30% prox Cx and mid LAD.          Na 146  K 4.2  CO2 17  BUN 136  Cr 4.49  Ca 9.5  AG 21  Alb 3.7  lipase 124  AST 434  ALT 300 Tbili 1.4   BNP >4500   CPK 521  trop 302    Lactic acid 5.1   WBC 9K  Hb 11  plt 198    INR 2.5    Assessment/ Plan: AKI on CKD IV - b/l creatinine 1.81- 2.76 from may 2022, eGFR 19- 27 ml/min. Here w/ AMS, creat 4.5, in setting of several days of diarrhea at home. Found down. CPK 521, not sig elevated. No obstruction by imaging. No UA yet. High LA in ED, got 2 L bolus in ED. BP's high on presentation, no evidence of shock. Exam w/o sig vol overload. Cardiology evaluating for possible low output given her ^LFT"s and hx of CHF.  Plan is foley placement, echo bedside per cardiology. Get UA, urine lytes. Hold off on IVF's for now, already  had 2 L bolus in ED.  Pt DNR.  Pt is not a good HD candidate, have explained to the pt's granddaughter. Await also cardiology's assessment. Will follow.  AMS - probably multifactorial, uremia is likely  H/o CVA Lactic acidosis - per primary team, r/o sepsis. Blood cx's sent.  Diarrhea  Acute resp failure w/ hypoxemia - no edema by CXR, not grossly overloaded. Not sure cause.      Kelly Splinter  MD 12/15/2020, 8:54 PM  Recent Labs  Lab 12/12/20 1536 12/14/20 1846  WBC 8.4 9.0  HGB 10.6* 11.3*   Recent Labs  Lab 12/15/20 0722  K 4.2  BUN 136*  CREATININE 4.49*   CALCIUM 9.5

## 2020-12-15 NOTE — H&P (Signed)
Date: 12/15/2020               Patient Name:  [name] MRN: [id number]  DOB: Apr 18, 1932 Age / Sex: 87 y.o., female   PCP: Dierdre Harness, FNP         Medical Service: Internal Medicine Teaching Service         Attending Physician: Dr. Jimmye Norman, Elaina Pattee, MD    First Contact: Delene Ruffini, MD Pager: Lysbeth Penner [phone]  Second Contact: Jeanie Cooks, MD Pager: Governor Rooks [phone]       After Hours (After 5p/  First Contact Pager: [phone]  weekends / holidays): Second Contact Pager: [phone]   SUBJECTIVE   Chief Complaint: Diarrhea, abdominal pain  History of Present Illness: Patient is an 87 year old with history of CKD, diastolic heart dysfunction, poorly controlled type 2 diabetes, hypertension, hyperlipidemia, prior CVA who presents with complaints of diarrhea and abdominal pain.  History primarily obtained from granddaughter who is her primary caretaker.  Patient lives alone but granddaughter visits frequently.  Granddaughter reports that starting roughly Monday patient began to report feeling ill.  Granddaughter returned on Tuesday and noticed that the patient was not eating and there was diarrhea on the bed.  Worsening diarrhea continued on Wednesday, granddaughter brought patient to the emergency department for evaluation, however they left before being seen.  On Thursday morning after returning from the emergency department, granddaughter returned to check on patient and found patient on floor.  Unknown duration possibly up to 6 hours.  Granddaughter noted significant amount of diarrhea on the floor, but denies any blood.  Patient endorsing associated abdominal pain.  Patient has had progressively worsening p.o. intake and has not taken medications for previous 5 days either.  Feeling short of breath but denies any chest pain.  Additionally daughter reports that by the time she left the patient this evening, patient had become more confused which has never happened before.  At baseline  she is able to cook and bathe her self.    ED Course: During initial evaluation in the ED patient was noted to have significant elevation in BUN up to 136 and creatinine up to 4.49 from baseline around 1.75  Meds:  Current Meds  Medication Sig   acetaminophen (TYLENOL) 325 MG tablet Take 2 tablets (650 mg total) by mouth every 6 (six) hours as needed for mild pain or fever (or temp > 37.5 C (99.5 F)).   albuterol (VENTOLIN HFA) 108 (90 Base) MCG/ACT inhaler Inhale 2 puffs into the lungs every 6 (six) hours as needed for wheezing or shortness of breath.   atorvastatin (LIPITOR) 40 MG tablet Take 1 tablet (40 mg total) by mouth daily at 6 PM. (Patient taking differently: Take 40 mg by mouth at bedtime.)   clopidogrel (PLAVIX) 75 MG tablet Take 75 mg by mouth daily.   furosemide (LASIX) 40 MG tablet Take 40 mg by mouth.   isosorbide mononitrate (IMDUR) 60 MG 24 hr tablet TAKE 1 TABLET BY MOUTH  DAILY (Patient taking differently: 60 mg daily.)   latanoprost (XALATAN) 0.005 % ophthalmic solution Place 1 drop into the right eye at bedtime.   Multiple Vitamins-Minerals (ONE-A-DAY WOMENS 50+) TABS Take 1 tablet by mouth daily with breakfast.   neomycin-polymyxin-hydrocortisone (CORTISPORIN) 3.5-10000-1 OTIC suspension Place 2-3 drops into the right ear at bedtime.   nitroGLYCERIN (NITROSTAT) 0.4 MG SL tablet Place 1 tablet (0.4 mg total) under the tongue every 5 (five) minutes as needed for chest pain.    Past Medical  History:  Diagnosis Date   Anemia 04/03/2011   Anginal pain ([place])    a. 02/2014 MV: Low risk; b. 05/2017 MV: small area of inferior basal ischemia. EF 54% - low risk-->Med mgmt.   Anxiety    Aortic valve sclerosis w/ systolic murmur    a. 95/6213 AoV sclerosis w/o stenosis.   Arthritis    "feet" (06/02/2017)   Blind left eye 1999   CKD (chronic kidney disease) stage 3, GFR 30-59 ml/min (HCC)    Depression    Diastolic dysfunction    a. 10/2018 Echo: EF 60-65%, mild  septal/posterior LVH. Impaired relaxation. Nl RV size/fxn. Mod dil LA. Trace TR. Triv AI. Mod AoV sclerosis w/o stenosis. Nl PASP.   Exertional dyspnea    GERD (gastroesophageal reflux disease)    High cholesterol    Hip fracture ([place]) 07/17/11   fall from 1-2 feet; left   Hypertension    Ischemic stroke ([place]) 05/2017   a. 06/2017 Holter: RSR, PACs, no afib.   Prosthetic eye globe 1999   "from diabetic; left eye"   Type II diabetes mellitus ([place])    not taking any medication (06/02/2017)    Past Surgical History:  Procedure Laterality Date   ENUCLEATION Left    FRACTURE SURGERY     HIP ARTHROPLASTY  07/18/2011   Procedure: ARTHROPLASTY BIPOLAR HIP;  Surgeon: Nita Sells, MD;  Location: [place];  Service: Orthopedics;  Laterality: Left;   INTRAOCULAR PROSTHESES INSERTION  1999   left   LEFT HEART CATH AND CORS/GRAFTS ANGIOGRAPHY N/A 04/08/2019   Procedure: LEFT HEART CATH AND CORS/GRAFTS ANGIOGRAPHY;  Surgeon: Minna Merritts, MD;  Location: [place] [facility];  Service: Cardiovascular;  Laterality: N/A;   TONGUE BIOPSY  1998   "cancer"   TUBAL LIGATION  1970's    Social:  Lives With: Occupation: Support: Level of Function: PCP: Substances:  Family History: n/a  Allergies: Allergies as of 12/14/2020 - Review Complete 12/14/2020  Allergen Reaction Noted   Tape Other (See Comments) 05/21/2020    Review of Systems: A complete ROS was negative except as per HPI.   OBJECTIVE:   Physical Exam: Blood pressure (!) 187/91, pulse 88, resp. rate (!) 22, SpO2 94 %.  Constitutional: Ill-appearing, lying in bed, confused appears to be in any distress  HENT: Encephalopathic, atraumatic, dry mucous membranes Eyes: conjunctiva non-erythematous Neck: supple Cardiovascular: regular rate and rhythm, no m/r/g Pulmonary/Chest: normal work of breathing on room air, lungs clear to auscultation bilaterally Abdominal: soft, non-tender, non-distended MSK: normal bulk and  tone Neurological: alert & oriented x 3, 5/5 strength in bilateral upper and lower extremities, normal gait Skin: warm and dry Psych: unable to assess due to current mental status  Labs: CBC    Component Value Date/Time   WBC 9.0 12/14/2020 1846   RBC 3.92 12/14/2020 1846   HGB 11.3 (L) 12/14/2020 1846   HGB 12.5 03/27/2011 1341   HCT 35.2 (L) 12/14/2020 1846   HCT 40.8 06/03/2017 0822   HCT 37.5 03/27/2011 1341   PLT 198 12/14/2020 1846   PLT 203 03/27/2011 1341   MCV 89.8 12/14/2020 1846   MCV 92.1 03/27/2011 1341   MCH 28.8 12/14/2020 1846   MCHC 32.1 12/14/2020 1846   RDW 16.5 (H) 12/14/2020 1846   RDW 14.7 (H) 03/27/2011 1341   LYMPHSABS 0.4 (L) 12/14/2020 1846   LYMPHSABS 2.6 03/27/2011 1341   MONOABS 0.5 12/14/2020 1846   MONOABS 0.5 03/27/2011 1341   EOSABS 0.0  12/14/2020 1846   EOSABS 0.2 03/27/2011 1341   EOSABS 0.3 06/28/2009 1248   BASOSABS 0.0 12/14/2020 1846   BASOSABS 0.0 03/27/2011 1341     CMP     Component Value Date/Time   NA 146 (H) 12/15/2020 0722   NA 132 (L) 01/26/2019 1444   NA 146 (H) 09/06/2008 0942   K 4.2 12/15/2020 0722   K 3.8 09/06/2008 0942   CL 108 12/15/2020 0722   CL 108 09/06/2008 0942   CO2 17 (L) 12/15/2020 0722   CO2 28 09/06/2008 0942   GLUCOSE 148 (H) 12/15/2020 0722   GLUCOSE 82 09/06/2008 0942   BUN 136 (H) 12/15/2020 0722   BUN 44 (H) 01/26/2019 1444   BUN 15 09/06/2008 0942   CREATININE 4.49 (H) 12/15/2020 0722   CREATININE 1.13 (H) 10/12/2015 1420   CALCIUM 9.5 12/15/2020 0722   CALCIUM 9.5 09/06/2008 0942   PROT 7.6 12/15/2020 0722   PROT 7.7 06/03/2006 1041   ALBUMIN 3.7 12/15/2020 0722   ALBUMIN 3.5 06/03/2006 1041   AST 434 (H) 12/15/2020 0722   AST 69 (H) 06/03/2006 1041   ALT 300 (H) 12/15/2020 0722   ALT 51 (H) 06/03/2006 1041   ALKPHOS 53 12/15/2020 0722   ALKPHOS 77 06/03/2006 1041   BILITOT 1.4 (H) 12/15/2020 0722   BILITOT 0.60 06/03/2006 1041   GFRNONAA 9 (L) 12/15/2020 0722   GFRAA 38 (L)  06/08/2019 0445    Imaging: CT ABDOMEN PELVIS WO CONTRAST  Result Date: 12/15/2020 CLINICAL DATA:  Abdominal pain and vomiting. EXAM: CT ABDOMEN AND PELVIS WITHOUT CONTRAST TECHNIQUE: Multidetector CT imaging of the abdomen and pelvis was performed following the standard protocol without IV contrast. COMPARISON:  July 10, 2014 FINDINGS: Lower chest: Enlarged heart. Calcific atherosclerotic disease of the aorta and coronary arteries. Bilateral small pleural effusions. Mild interstitial edema. Hepatobiliary: Normal nonenhanced appearance of the liver. Layering gallstones or sludge in the dependent portion of the gallbladder. Pancreas: Unremarkable. No pancreatic ductal dilatation or surrounding inflammatory changes. Spleen: Small spleen. Adrenals/Urinary Tract: No adrenal masses. 1.4 cm hyperdense right renal mass in the lower pole of the right kidney. No evidence of hydronephrosis. Normal urinary bladder. Stomach/Bowel: Stomach is within normal limits. No evidence of appendicitis. No evidence of bowel wall thickening, distention, or inflammatory changes. Vascular/Lymphatic: Heavy atherosclerosis and tortuosity of the aorta and its main branches. No enlarged abdominal or pelvic lymph nodes. Reproductive: 5.7 cm fat density pelvic mass, stable. Other: Fat containing periumbilical anterior abdominal wall hernia. Musculoskeletal: Mild superior endplate compression deformity of T12 vertebral body, stable. IMPRESSION: 1. No evidence of acute abnormalities within the solid abdominal organs. 2. 1.4 cm hyperdense right renal mass in the lower pole of the right kidney. This may represent a hemorrhagic or proteinaceous cyst, however renal cell carcinoma cannot be excluded. 3. Enlarged heart with mild interstitial edema and bilateral small pleural effusions. 4. Anasarca. 5. Calcific atherosclerotic disease of the aorta and coronary arteries. 6. Fat containing periumbilical anterior abdominal wall hernia. 7. Mild superior  endplate compression deformity of T12 vertebral body, stable. Aortic Atherosclerosis (ICD10-I70.0). Electronically Signed   By: Fidela Salisbury M.D.   On: 12/15/2020 13:51   DG CHEST PORT 1 VIEW  Result Date: 12/15/2020 CLINICAL DATA:  Increased weakness, poor p.o. intake, shortness of breath for 3 days. EXAM: PORTABLE CHEST 1 VIEW COMPARISON:  Chest x-rays dated 12/12/2020 and 09/18/2020. FINDINGS: Stable cardiomegaly. Lungs are clear. No pleural effusion or pneumothorax is seen. Osseous structures about the chest  are unremarkable. IMPRESSION: 1. No active disease. No evidence of pneumonia. 2. Stable cardiomegaly. Electronically Signed   By: Franki Cabot M.D.   On: 12/15/2020 16:44    EKG: personally reviewed my interpretation is sinus rhythm, ST elevations noted in V1 V2   ASSESSMENT & PLAN:    Assessment & Plan by Problem: Principal Problem:   Acute hypoxemic respiratory failure (HCC)   Tyson Masin is a 87 y.o. with pertinent PMH of ACS, hyperlipidemia,, stroke who presented with altered mental status and recent diarrheal illness, and admitted for dehydration, found to have significant elevations and cardiac enzymes and evidence of multiorgan dysfunction on hospital day 0.  During initial work-up in the emergency department patient was noted to have evidence of multiorgan dysfunction including liver, kidneys, cardiac as well as encephalopathy.  She was noted to be in significant heart failure with BNP over 4500, unclear as to whether heart failure was precipitated by demand ischemia in the setting of recent diarrheal illness and known prior ACS.  Cardiology and nephrology have been consulted at this time.    Elevated troponin and BNP - Etiology remains unclear at this time, she has a history of ACS with some EKG changes, however it is unclear if these changes are related to demand ischemia in the setting of her recent diarrheal illness and poor oral intake. She received 2 L bolus  in the emergency department, will hold on IV fluids for now. - Troponin elevated 300, EKG changes noted in V1 V2 and aVR.  - BNP over 4500 Evidence for endorgan damage, concern for congestive hepatopathy, acute renal failure, encephalopathy. - Cardiology has been consulted, and heparin has been initiated. Stat echo has been ordered, will follow up. Continue to trend troponin.  Acute renal failure Significant elevation in creatinine up to 4.5 from baseline of 2.  She is uremic at 136, concerned that this may be contributing to encephalopathy.  Potassium is currently within normal limits at 4.2. - Nephrology has been consulted  LFT elevation with concern for hepatic failure secondary to congestive hepatopathy. Concern for congestive hepatopathy with AST 434, ALT 300 secondary to significant congestive heart failure.  She has elevation in her PT/INR.  Despite elevation in PT/INR no history of bleeding per granddaughter, and feel benefit of starting heparin currently outweighs risks.  Ammonia is elevated to 40 as well.  It is possible that this elevation in ammonia is contributing to her altered mental status. -Continue to monitor  Acute metabolic encephalopathy Consider hepatic encephalopathy and likely multifactorial uremia, also likely related to heart failure. WBC 9.0, and the patient has not been febrile do not feel her encephalopathy is related to infection at this time. -Anticipate this will resolve as organ function improves.  She has a history of cerebrovascular accident, her mental status is likely more related to metabolic causes at this time however if mental status does not improve in the future, can consider CT to evaluate for CVA.  Lactic acidosis 5.1, continue to trend  Elevated CK 521, continue to trend.  Anion gap  Diarrheal illness. Patient has dry mucous membranes, and cool extremities.  No episodes of diarrhea have been noted since admission. - C. difficile and GI panel  have been altered.  These results will need to be followed up.   #Hypodense right renal mass Differential hemorrhagic or proteinaceous cyst, renal cell carcinoma cannot be excluded.   Diet: NPO VTE: Heparin IVF: None,None Code: DNR  Barriers to Discharge: Medical stability  Dispo:  Admit patient to Inpatient with expected length of stay greater than 2 midnights.  Signed: Delene Ruffini, MD Internal Medicine Resident PGY-1 Pager: [phone]  12/15/2020, 8:09 PM

## 2020-12-15 NOTE — Progress Notes (Signed)
ANTICOAGULATION CONSULT NOTE - Initial Consult  Pharmacy Consult for IV heparin Indication: chest pain/ACS  Allergies  Allergen Reactions   Tape Other (See Comments)    SKIN IS SENSITIVE    Patient Measurements:   Heparin Dosing Weight: ~ 56 kg  Vital Signs: Temp: 97.5 F (36.4 C) (12/03 1940) Temp Source: Oral (12/03 1940) BP: 178/85 (12/03 1940) Pulse Rate: 74 (12/03 1940)  Labs: Recent Labs    12/14/20 1846 12/15/20 0722 12/15/20 1851  HGB 11.3*  --   --   HCT 35.2*  --   --   PLT 198  --   --   LABPROT  --   --  26.6*  INR  --   --  2.5*  CREATININE  --  4.49*  --   CKTOTAL  --   --  521*  TROPONINIHS  --   --  302*    CrCl cannot be calculated (Unknown ideal weight.).   Medical History: Past Medical History:  Diagnosis Date   Anemia 04/03/2011   Anginal pain ([place])    a. 02/2014 MV: Low risk; b. 05/2017 MV: small area of inferior basal ischemia. EF 54% - low risk-->Med mgmt.   Anxiety    Aortic valve sclerosis w/ systolic murmur    a. 93/8101 AoV sclerosis w/o stenosis.   Arthritis    "feet" (06/02/2017)   Blind left eye 1999   CKD (chronic kidney disease) stage 3, GFR 30-59 ml/min (HCC)    Depression    Diastolic dysfunction    a. 10/2018 Echo: EF 60-65%, mild septal/posterior LVH. Impaired relaxation. Nl RV size/fxn. Mod dil LA. Trace TR. Triv AI. Mod AoV sclerosis w/o stenosis. Nl PASP.   Exertional dyspnea    GERD (gastroesophageal reflux disease)    High cholesterol    Hip fracture ([place]) 07/17/11   fall from 1-2 feet; left   Hypertension    Ischemic stroke ([place]) 05/2017   a. 06/2017 Holter: RSR, PACs, no afib.   Prosthetic eye globe 1999   "from diabetic; left eye"   Type II diabetes mellitus ([place])    not taking any medication (06/02/2017)    Medications:  Infusions:   heparin      Assessment: 87 yo admitted with abdominal pain and diarrhea.  Found with AKI and elevated troponin.  Pharmacy asked to start anticoagulation with IV heparin.   No known anticoagulants PTA.  Baseline INR elevated, assumed to be from liver dysfunction.  No overt bleeding or complications noted.  Goal of Therapy:  Heparin level 0.3-0.7 units/ml Monitor platelets by anticoagulation protocol: Yes   Plan:  Start IV heparin at 600 units/hr, will not bolus given elevated INR at baseline. Check heparin level in 8 hrs. Daily heparin level and CBC.  [place] Crane, Roylene Reason, BCCP Clinical Pharmacist  12/15/2020 9:10 PM   [facility] pharmacy phone numbers are listed on [email or web address]

## 2020-12-15 NOTE — Progress Notes (Signed)
I went to go evaluate the patient at bedside after receiving sign out from the day team. I called the patient's granddaughter, [name], to discuss goals of care due to concern for low output heart failure and acute renal failure. She confirms that [name] is a DNR and states that she would not want any invasive or to be kept alive by any artificial means. [name] states that she would like the patient's daughters to be present tomorrow to discuss comfort care in more detail and likely transition to hospice care.  I will consult palliative care now and honor the patient's wishes but holding off on invasive medications or treatments at this time.  Lawerance Cruel, D.O.  Internal Medicine Resident, PGY-3 [facility]  Pager: [phone] 11:04 PM, 12/15/2020   **Please contact the on call pager after 5 pm and on weekends at [phone].**

## 2020-12-15 NOTE — Progress Notes (Signed)
Patient brought to 4E from ED. VSS. Telemetry box applied, CCMD notified. CHG bath completed.   Daymon Larsen, RN

## 2020-12-15 NOTE — ED Notes (Signed)
Patient moved to recliner for comfort

## 2020-12-15 NOTE — ED Provider Notes (Signed)
[facility] EMERGENCY DEPARTMENT Provider Note   CSN: [id number] Arrival date & time: 12/14/20  1728     History Chief Complaint  Patient presents with   Weakness    [name] is a 87 y.o. female.   Weakness Associated symptoms: abdominal pain and cough (chronic)   Associated symptoms: no fever     Pt has ben not eating or drinking the last 4 days.  At most taking a couple of sips of pedialyte or spoonful of soup.  She has been having numerous episodes of diarrhea.  She has not had much stool output recently just a streak.  She has been more confused and weak.  She has not been able to see her doctor this week.  She has been complaining of abd cramping pain.  Pt has told family she feels like she is dying.  No vomiting.  No known fevers but has not been checked much.  Family tried to come to the emergency room earlier this week but had to wait for very long time and ended up leaving before being evaluated.  He did call the doctor's office who suggested going back to the emergency room.  This morning patient is still very weak.  She denies specific complaints at this time.  Does take a lot of effort for her to just answer few questions  Past Medical History:  Diagnosis Date   Anemia 04/03/2011   Anginal pain ([place])    a. 02/2014 MV: Low risk; b. 05/2017 MV: small area of inferior basal ischemia. EF 54% - low risk-->Med mgmt.   Anxiety    Aortic valve sclerosis w/ systolic murmur    a. 62/9528 AoV sclerosis w/o stenosis.   Arthritis    "feet" (06/02/2017)   Blind left eye 1999   CKD (chronic kidney disease) stage 3, GFR 30-59 ml/min (HCC)    Depression    Diastolic dysfunction    a. 10/2018 Echo: EF 60-65%, mild septal/posterior LVH. Impaired relaxation. Nl RV size/fxn. Mod dil LA. Trace TR. Triv AI. Mod AoV sclerosis w/o stenosis. Nl PASP.   Exertional dyspnea    GERD (gastroesophageal reflux disease)    High cholesterol    Hip fracture ([place]) 07/17/11   fall  from 1-2 feet; left   Hypertension    Ischemic stroke ([place]) 05/2017   a. 06/2017 Holter: RSR, PACs, no afib.   Prosthetic eye globe 1999   "from diabetic; left eye"   Type II diabetes mellitus ([place])    not taking any medication (06/02/2017)    Patient Active Problem List   Diagnosis Date Noted   Acute exacerbation of congestive heart failure ([place]) 05/23/2020   Shortness of breath 05/21/2020   Acute hypoxemic respiratory failure ([place]) 11/24/2019   Hypoxia 11/23/2019   Hemiparesis due to old stroke ([place]) 05/12/2019   Tachycardia    Coronary artery disease involving native coronary artery of native heart without angina pectoris    Unstable angina ([place]) 04/08/2019   HTN (hypertension) 04/07/2019   HLD (hyperlipidemia) 04/07/2019   Elevated troponin 04/07/2019   AKI (acute kidney injury) ([place]) 11/01/2018   Leukocytosis 02/22/2018   Influenza A 02/21/2018   Wheezy bronchitis 02/21/2018   Acute bronchitis 02/21/2018   Hypertension associated with stage 3 chronic kidney disease due to type 2 diabetes mellitus ([place]) 06/09/2017   Dyslipidemia associated with type 2 diabetes mellitus ([place]) 06/09/2017   Stage 3 chronic kidney disease due to type 2 diabetes mellitus ([place]) 06/09/2017   Peripheral  sensory neuropathy due to type 2 diabetes mellitus ([place]) 06/09/2017   GERD without esophagitis 06/09/2017   Glaucoma due to type 2 diabetes mellitus ([place]) 06/09/2017   Alcohol abuse 06/04/2017   Ischemic stroke ([place]) 06/02/2017   Type 2 diabetes mellitus with vascular disease ([place]) 06/02/2017   Chest pain 06/02/2017   Closed non-physeal fracture of metatarsal bone of right foot 07/31/2016   Hypertensive urgency 02/17/2014   Chronic diastolic heart failure ([place]) 03/15/2013   Closed left hip fracture ([place]) 07/17/2011    Past Surgical History:  Procedure Laterality Date   ENUCLEATION Left    FRACTURE SURGERY     HIP ARTHROPLASTY  07/18/2011   Procedure: ARTHROPLASTY BIPOLAR HIP;  Surgeon: Nita Sells, MD;  Location: [facility];  Service: Orthopedics;  Laterality: Left;   INTRAOCULAR PROSTHESES INSERTION  1999   left   LEFT HEART CATH AND CORS/GRAFTS ANGIOGRAPHY N/A 04/08/2019   Procedure: LEFT HEART CATH AND CORS/GRAFTS ANGIOGRAPHY;  Surgeon: Minna Merritts, MD;  Location: [facility];  Service: Cardiovascular;  Laterality: N/A;   [place]   "cancer"   TUBAL LIGATION  1970's     OB History   No obstetric history on file.     Family History  Problem Relation Age of Onset   Diabetes type II Mother    Diabetes type II Father    Diabetes type II Sister    Colon cancer Neg Hx     Social History   Tobacco Use   Smoking status: Former    Packs/day: 1.50    Years: 47.00    Pack years: 70.50    Types: Cigarettes    Quit date: 09/13/1997    Years since quitting: 23.2   Smokeless tobacco: Never  Vaping Use   Vaping Use: Never used  Substance Use Topics   Alcohol use: Not Currently    Alcohol/week: 17.0 standard drinks    Types: 52 Shots of liquor per week    Comment: Quit 03/2018   Drug use: No    Types: Marijuana, Heroin    Comment: 06/02/2017  "last drug use was in the 1980s." Prev injected heroin.    Home Medications Prior to Admission medications   Medication Sig Start Date End Date Taking? Authorizing Provider  acetaminophen (TYLENOL) 325 MG tablet Take 2 tablets (650 mg total) by mouth every 6 (six) hours as needed for mild pain or fever (or temp > 37.5 C (99.5 F)). 06/05/17   Elgergawy, Silver Huguenin, MD  albuterol (VENTOLIN HFA) 108 (90 Base) MCG/ACT inhaler Inhale 2 puffs into the lungs every 6 (six) hours as needed for wheezing or shortness of breath.    [provider]  amLODipine (NORVASC) 10 MG tablet Take 1 tablet (10 mg total) by mouth daily. 06/09/19 11/24/19  Darliss Cheney, MD  atorvastatin (LIPITOR) 40 MG tablet Take 1 tablet (40 mg total) by mouth daily at 6 PM. Patient taking differently: Take 40 mg by mouth at bedtime.  06/08/19   Darliss Cheney, MD  carvedilol (COREG) 12.5 MG tablet Take 1 tablet (12.5 mg total) by mouth 2 (two) times daily with a meal. 06/08/19 11/24/19  Darliss Cheney, MD  cetirizine (ZYRTEC) 10 MG tablet Take 10 mg by mouth daily as needed for allergies or rhinitis.    [provider]  clopidogrel (PLAVIX) 75 MG tablet Take 75 mg by mouth daily.    [provider]  Cobalamin Combinations (NEURIVA PLUS) CAPS Take 1 capsule  by mouth daily with breakfast.    [provider]  fluticasone (FLONASE) 50 MCG/ACT nasal spray Place 1-2 sprays into both nostrils daily as needed for allergies or rhinitis.    [provider]  folic acid (FOLVITE) 1 MG tablet Take 1 tablet (1 mg total) by mouth daily. Patient not taking: Reported on 05/21/2020 06/06/17   Elgergawy, Silver Huguenin, MD  gabapentin (NEURONTIN) 300 MG capsule Take 1 capsule (300 mg total) by mouth daily. 05/25/20   Armando Reichert, MD  guaiFENesin (MUCINEX) 600 MG 12 hr tablet Take 1 tablet (600 mg total) by mouth 2 (two) times daily as needed for to loosen phlegm. 05/24/20   Armando Reichert, MD  hydrALAZINE (APRESOLINE) 10 MG tablet TAKE 1 TABLET(10 MG) BY MOUTH THREE TIMES DAILY Patient taking differently: Take 10 mg by mouth 3 (three) times daily. 08/01/19   Belva Crome, MD  isosorbide mononitrate (IMDUR) 60 MG 24 hr tablet TAKE 1 TABLET BY MOUTH  DAILY 12/03/20   Belva Crome, MD  latanoprost (XALATAN) 0.005 % ophthalmic solution Place 1 drop into the right eye at bedtime. 05/07/20   [provider]  Multiple Vitamins-Minerals (ONE-A-DAY WOMENS 50+) TABS Take 1 tablet by mouth daily with breakfast.    [provider]  neomycin-polymyxin-hydrocortisone (CORTISPORIN) 3.5-10000-1 OTIC suspension Place 2-3 drops into the right ear at bedtime.    [provider]  nicotine (NICODERM CQ - DOSED IN MG/24 HR) 7 mg/24hr patch Place 1 patch (7 mg total) onto the skin daily. 05/25/20   Armando Reichert, MD   nitroGLYCERIN (NITROSTAT) 0.4 MG SL tablet Place 1 tablet (0.4 mg total) under the tongue every 5 (five) minutes as needed for chest pain. 02/11/19 11/24/19  Isaiah Serge, NP  omeprazole (PRILOSEC) 20 MG capsule TAKE 1 CAPSULE BY MOUTH  DAILY 09/04/20   Isaiah Serge, NP    Allergies    Tape  Review of Systems   Review of Systems  Constitutional:  Negative for fever.  Respiratory:  Positive for cough (chronic).   Gastrointestinal:  Positive for abdominal pain.  Neurological:  Positive for weakness.  All other systems reviewed and are negative.  Physical Exam Updated Vital Signs BP (!) 193/103   Pulse 91   Resp 15   SpO2 93%   Physical Exam Vitals and nursing note reviewed.  Constitutional:      Appearance: She is well-developed. She is ill-appearing.  HENT:     Head: Normocephalic and atraumatic.     Right Ear: External ear normal.     Left Ear: External ear normal.     Mouth/Throat:     Mouth: Mucous membranes are dry.  Eyes:     General: No scleral icterus.       Right eye: No discharge.        Left eye: No discharge.     Conjunctiva/sclera: Conjunctivae normal.  Neck:     Trachea: No tracheal deviation.  Cardiovascular:     Rate and Rhythm: Normal rate and regular rhythm.  Pulmonary:     Effort: Pulmonary effort is normal. No respiratory distress.     Breath sounds: Normal breath sounds. No stridor. No wheezing or rales.  Abdominal:     General: Bowel sounds are normal. There is no distension.     Palpations: Abdomen is soft.     Tenderness: There is no abdominal tenderness. There is no guarding or rebound.  Musculoskeletal:        General: No  tenderness or deformity.     Cervical back: Neck supple.  Skin:    General: Skin is warm and dry.     Findings: No rash.  Neurological:     General: No focal deficit present.     Mental Status: She is alert.     Cranial Nerves: No cranial nerve deficit (no facial droop, extraocular movements intact, no slurred  speech).     Sensory: No sensory deficit.     Motor: Weakness present. No abnormal muscle tone or seizure activity.     Coordination: Coordination normal.     Comments: Does follow commands and will squeeze my fingers and move both legs, difficulty lifting extremities off the bed  Psychiatric:        Mood and Affect: Mood normal.    ED Results / Procedures / Treatments   Labs (all labs ordered are listed, but only abnormal results are displayed) Labs Reviewed  CBC WITH DIFFERENTIAL/PLATELET - Abnormal; Notable for the following components:      Result Value   Hemoglobin 11.3 (*)    HCT 35.2 (*)    RDW 16.5 (*)    nRBC 2.2 (*)    Neutro Abs 8.0 (*)    Lymphs Abs 0.4 (*)    All other components within normal limits  COMPREHENSIVE METABOLIC PANEL - Abnormal; Notable for the following components:   Sodium 146 (*)    CO2 17 (*)    Glucose, Bld 148 (*)    BUN 136 (*)    Creatinine, Ser 4.49 (*)    AST 434 (*)    ALT 300 (*)    Total Bilirubin 1.4 (*)    GFR, Estimated 9 (*)    Anion gap 21 (*)    All other components within normal limits  LIPASE, BLOOD - Abnormal; Notable for the following components:   Lipase 124 (*)    All other components within normal limits  AMMONIA - Abnormal; Notable for the following components:   Ammonia 40 (*)    All other components within normal limits  RESP PANEL BY RT-PCR (FLU A&B, COVID) [id number]  GASTROINTESTINAL PANEL BY PCR, STOOL (REPLACES STOOL CULTURE)  C DIFFICILE QUICK SCREEN W PCR REFLEX    URINALYSIS, ROUTINE W REFLEX MICROSCOPIC    EKG EKG Interpretation  Date/Time:  Friday December 14 2020 18:09:32 EST Ventricular Rate:  92 PR Interval:  134 QRS Duration: 94 QT Interval:  360 QTC Calculation: 445 R Axis:   1 Text Interpretation: Sinus rhythm with occasional Premature ventricular complexes Moderate voltage criteria for LVH, may be normal variant ( R in aVL , Cornell product ) Nonspecific ST and T wave abnormality Abnormal ECG  Confirmed by Dorie Rank [phone]) on 12/15/2020 11:58:49 AM  Radiology CT ABDOMEN PELVIS WO CONTRAST  Result Date: 12/15/2020 CLINICAL DATA:  Abdominal pain and vomiting. EXAM: CT ABDOMEN AND PELVIS WITHOUT CONTRAST TECHNIQUE: Multidetector CT imaging of the abdomen and pelvis was performed following the standard protocol without IV contrast. COMPARISON:  July 10, 2014 FINDINGS: Lower chest: Enlarged heart. Calcific atherosclerotic disease of the aorta and coronary arteries. Bilateral small pleural effusions. Mild interstitial edema. Hepatobiliary: Normal nonenhanced appearance of the liver. Layering gallstones or sludge in the dependent portion of the gallbladder. Pancreas: Unremarkable. No pancreatic ductal dilatation or surrounding inflammatory changes. Spleen: Small spleen. Adrenals/Urinary Tract: No adrenal masses. 1.4 cm hyperdense right renal mass in the lower pole of the right kidney. No evidence of hydronephrosis. Normal urinary bladder. Stomach/Bowel: Stomach is  within normal limits. No evidence of appendicitis. No evidence of bowel wall thickening, distention, or inflammatory changes. Vascular/Lymphatic: Heavy atherosclerosis and tortuosity of the aorta and its main branches. No enlarged abdominal or pelvic lymph nodes. Reproductive: 5.7 cm fat density pelvic mass, stable. Other: Fat containing periumbilical anterior abdominal wall hernia. Musculoskeletal: Mild superior endplate compression deformity of T12 vertebral body, stable. IMPRESSION: 1. No evidence of acute abnormalities within the solid abdominal organs. 2. 1.4 cm hyperdense right renal mass in the lower pole of the right kidney. This may represent a hemorrhagic or proteinaceous cyst, however renal cell carcinoma cannot be excluded. 3. Enlarged heart with mild interstitial edema and bilateral small pleural effusions. 4. Anasarca. 5. Calcific atherosclerotic disease of the aorta and coronary arteries. 6. Fat containing periumbilical anterior  abdominal wall hernia. 7. Mild superior endplate compression deformity of T12 vertebral body, stable. Aortic Atherosclerosis (ICD10-I70.0). Electronically Signed   By: Fidela Salisbury M.D.   On: 12/15/2020 13:51    Procedures .Critical Care Performed by: Dorie Rank, MD Authorized by: Dorie Rank, MD   Critical care provider statement:    Critical care time (minutes):  30   Critical care was time spent personally by me on the following activities:  Development of treatment plan with patient or surrogate, discussions with consultants, evaluation of patient's response to treatment, examination of patient, ordering and review of laboratory studies, ordering and review of radiographic studies, ordering and performing treatments and interventions, pulse oximetry, re-evaluation of patient's condition and review of old charts   Medications Ordered in ED Medications  sodium chloride 0.9 % bolus 1,000 mL (0 mLs Intravenous Stopped 12/15/20 0940)    Followed by  0.9 %  sodium chloride infusion (1,000 mLs Intravenous New Bag/Given 12/15/20 0940)  labetalol (NORMODYNE) injection 20 mg (has no administration in time range)  sodium chloride 0.9 % bolus 1,000 mL (1,000 mLs Intravenous New Bag/Given 12/15/20 1242)    ED Course  I have reviewed the triage vital signs and the nursing notes.  Pertinent labs & imaging results that were available during my care of the patient were reviewed by me and considered in my medical decision making (see chart for details).  Clinical Course as of 12/15/20 1357  Sat Dec 15, 2020  1157 Laboratory tests are notable for hypernatremia.  Acute renal failure with uremia [JK]  1158 Patient also has elevated LFTs and lipase [JK]  1356 ET scan does not show any evidence of acute obstruction or infection [JK]    Clinical Course User Index [JK] Dorie Rank, MD   MDM Rules/Calculators/A&P                           Patient presented with weakness, diarrhea and change in  mental status.  Patient noted to be extremely dehydrated on clinical exam.  Patient's laboratory tests do show evidence of acute kidney injury with significantly elevated BUN and creatinine.  Patient also has elevated LFTs but does have history of hepatitis C.  COVID and flu are negative.  With her elevated lipase and LFTs CT scan was performed but there is no signs of any acute obstruction or mass.  Symptoms may be related to viral gastroenteritis but at this time patient is confused and weak related to her severe dehydration.  Patient has been started on IV fluids.  I will consult the medical service for admission Final Clinical Impression(s) / ED Diagnoses Final diagnoses:  Dehydration  AKI (acute kidney  injury) [facility])     Dorie Rank, MD 12/15/20 1359

## 2020-12-16 ENCOUNTER — Inpatient Hospital Stay (HOSPITAL_COMMUNITY): Payer: Medicare Other

## 2020-12-16 ENCOUNTER — Inpatient Hospital Stay: Payer: Self-pay

## 2020-12-16 DIAGNOSIS — I25119 Atherosclerotic heart disease of native coronary artery with unspecified angina pectoris: Secondary | ICD-10-CM | POA: Diagnosis not present

## 2020-12-16 DIAGNOSIS — R627 Adult failure to thrive: Secondary | ICD-10-CM

## 2020-12-16 DIAGNOSIS — I5030 Unspecified diastolic (congestive) heart failure: Secondary | ICD-10-CM

## 2020-12-16 DIAGNOSIS — J9601 Acute respiratory failure with hypoxia: Secondary | ICD-10-CM

## 2020-12-16 DIAGNOSIS — R778 Other specified abnormalities of plasma proteins: Secondary | ICD-10-CM | POA: Diagnosis not present

## 2020-12-16 DIAGNOSIS — Z515 Encounter for palliative care: Secondary | ICD-10-CM

## 2020-12-16 DIAGNOSIS — N179 Acute kidney failure, unspecified: Secondary | ICD-10-CM

## 2020-12-16 DIAGNOSIS — Z66 Do not resuscitate: Secondary | ICD-10-CM

## 2020-12-16 LAB — ECHOCARDIOGRAM COMPLETE
AV Mean grad: 10.5 mmHg
AV Peak grad: 22.8 mmHg
Ao pk vel: 2.39 m/s
Area-P 1/2: 5.66 cm2
Calc EF: 38.1 %
MV M vel: 5 m/s
MV Peak grad: 100 mmHg
P 1/2 time: 478 msec
Radius: 0.4 cm
S' Lateral: 3.9 cm
Single Plane A2C EF: 49 %
Single Plane A4C EF: 18.3 %
Weight: 2059.98 oz

## 2020-12-16 LAB — CREATININE, URINE, RANDOM: Creatinine, Urine: 101.18 mg/dL

## 2020-12-16 LAB — SODIUM, URINE, RANDOM: Sodium, Ur: <10 mmol/L

## 2020-12-16 LAB — GLUCOSE, CAPILLARY: Glucose-Capillary: 125 mg/dL — ABNORMAL HIGH (ref 70–99)

## 2020-12-16 LAB — LACTIC ACID, PLASMA: Lactic Acid, Venous: <0.3 mmol/L — ABNORMAL LOW (ref 0.5–1.9)

## 2020-12-16 MED ORDER — HYDROMORPHONE HCL 1 MG/ML IJ SOLN
0.5000 mg | INTRAMUSCULAR | Status: DC | PRN
  Administered 2020-12-16 – 2020-12-17 (×4): 0.5 mg via INTRAVENOUS
  Filled 2020-12-16 (×4): qty 1

## 2020-12-16 MED ORDER — LORAZEPAM 2 MG/ML IJ SOLN: 1.0000 mg | INTRAMUSCULAR | Status: DC | PRN

## 2020-12-16 MED ORDER — HYDRALAZINE HCL 25 MG PO TABS: 25.0000 mg | ORAL_TABLET | Freq: Three times a day (TID) | ORAL | Status: DC

## 2020-12-16 MED ORDER — ASPIRIN EC 81 MG PO TBEC: 81.0000 mg | DELAYED_RELEASE_TABLET | Freq: Every day | ORAL | Status: DC

## 2020-12-16 MED ORDER — BLISTEX MEDICATED EX OINT
1.0000 "application " | TOPICAL_OINTMENT | CUTANEOUS | Status: DC | PRN
  Filled 2020-12-16: qty 6.3

## 2020-12-16 MED ORDER — HEPARIN SODIUM (PORCINE) 5000 UNIT/ML IJ SOLN: 5000.0000 [IU] | Freq: Three times a day (TID) | INTRAMUSCULAR | Status: DC

## 2020-12-16 MED ORDER — HEPARIN SODIUM (PORCINE) 5000 UNIT/ML IJ SOLN
5000.0000 [IU] | Freq: Three times a day (TID) | INTRAMUSCULAR | Status: DC
Start: 2020-12-16 — End: 2020-12-16

## 2020-12-16 MED ORDER — CHLORHEXIDINE GLUCONATE CLOTH 2 % EX PADS: 6.0000 | MEDICATED_PAD | Freq: Every day | CUTANEOUS | Status: DC

## 2020-12-16 NOTE — Progress Notes (Signed)
  Echocardiogram 2D Echocardiogram has been performed.  Merrie Roof F 12/16/2020, 12:02 PM

## 2020-12-16 NOTE — Progress Notes (Signed)
Patient has PIV access for IV dobutamine drip and there is no IV medications order it. Informed patient's RN that no PIV access at this time. Patient's RN will consult, if need PIV access for other IV medications ordered. HS [employer]

## 2020-12-16 NOTE — Progress Notes (Signed)
Dr Jonnie Finner approves PICC placement.

## 2020-12-16 NOTE — Evaluation (Addendum)
Clinical/Bedside Swallow Evaluation Patient Details  Name: [name] MRN: [id number] Date of Birth: 1932/07/24  Today's Date: 12/16/2020 Time: SLP Start Time (ACUTE ONLY): 9518 SLP Stop Time (ACUTE ONLY): 0946 SLP Time Calculation (min) (ACUTE ONLY): 8 min  Past Medical History:  Past Medical History:  Diagnosis Date   Anemia 04/03/2011   Anginal pain ([place])    a. 02/2014 MV: Low risk; b. 05/2017 MV: small area of inferior basal ischemia. EF 54% - low risk-->Med mgmt.   Anxiety    Aortic valve sclerosis w/ systolic murmur    a. 84/1660 AoV sclerosis w/o stenosis.   Arthritis    "feet" (06/02/2017)   Blind left eye 1999   CKD (chronic kidney disease) stage 3, GFR 30-59 ml/min (HCC)    Depression    Diastolic dysfunction    a. 10/2018 Echo: EF 60-65%, mild septal/posterior LVH. Impaired relaxation. Nl RV size/fxn. Mod dil LA. Trace TR. Triv AI. Mod AoV sclerosis w/o stenosis. Nl PASP.   Exertional dyspnea    GERD (gastroesophageal reflux disease)    High cholesterol    Hip fracture ([place]) 07/17/11   fall from 1-2 feet; left   Hypertension    Ischemic stroke ([place]) 05/2017   a. 06/2017 Holter: RSR, PACs, no afib.   Prosthetic eye globe 1999   "from diabetic; left eye"   Type II diabetes mellitus ([place])    not taking any medication (06/02/2017)   Past Surgical History:  Past Surgical History:  Procedure Laterality Date   ENUCLEATION Left    FRACTURE SURGERY     HIP ARTHROPLASTY  07/18/2011   Procedure: ARTHROPLASTY BIPOLAR HIP;  Surgeon: Nita Sells, MD;  Location: [facility];  Service: Orthopedics;  Laterality: Left;   INTRAOCULAR PROSTHESES INSERTION  1999   left   LEFT HEART CATH AND CORS/GRAFTS ANGIOGRAPHY N/A 04/08/2019   Procedure: LEFT HEART CATH AND CORS/GRAFTS ANGIOGRAPHY;  Surgeon: Minna Merritts, MD;  Location: [facility];  Service: Cardiovascular;  Laterality: N/A;   [place]   "cancer"   TUBAL LIGATION  1970's   HPI:  Patient is an  87 year old who presented with complaints of diarrhea and abdominal pain.  Pt found to have multiorgan failure.  Pt with history of CKD, diastolic heart dysfunction, poorly controlled type 2 diabetes, hypertension, hyperlipidemia, prior CVA. Pt is also s/p H&N radiation to base of tongue in 1999; MBS 11/24/19 revealed chronic dysphagia related to long term effects of radiation.    Assessment / Plan / Recommendation  Clinical Impression  Pt presents with significant risk of aspiration in setting of AMS with multiorgan failure and known hx of chronic aspiration with silent aspiration 2/2 H&N radiation noted on MBSS in November 2021.  Poor esophageal motility noted at that time on BaSw as well.  Pt is edentulous.  She says she typically wears dentures with PO intake, but that they are not present at this time. Pt exhibited immediate cough and wet vocal quality with all PO trials (thin liquid by cup and straw, puree by spoon) and required multiple swallows per bolus (3-4 with half teaspoon of water).  Recommend pt remain NPO at this time.  Pt would benefit from an instrumental evaluation of swallowing prior to initiation of PO diet.  Overall, pt's prognosis is guarded. Conferred with team regarding decision making.  More information may be available following rounding.  SLP is available to perform FEES today at bedside, if indicated, or MBS could likely be scheduled  next date to aid in decision making and determine if pt can initiate safe PO diet; however, goals of care may transition to comfort pending family arrival today, in which case clinical management of dysphagia is recommended.   Recommend pt remain NPO at this time.  If possible, medications by alternate means.  Priority oral meds can be administered crushed with puree, following with small amounts of water by spoon.  SLP Visit Diagnosis: Dysphagia, oropharyngeal phase (R13.12)    Aspiration Risk  Severe aspiration risk;Moderate aspiration risk     Diet Recommendation NPO   Medication Administration: Via alternative means (If priority oral meds are required, recommend crushed with puree, followed by small amounts of water by spoon.)    Other  Recommendations Oral Care Recommendations: Oral care QID    Recommendations for follow up therapy are one component of a multi-disciplinary discharge planning process, led by the attending physician.  Recommendations may be updated based on patient status, additional functional criteria and insurance authorization.  Follow up Recommendations  (TBD)      Assistance Recommended at Discharge  (TBD)  Functional Status Assessment  TBD  Frequency and Duration   TBD         Prognosis Prognosis for Safe Diet Advancement: Guarded Barriers to Reach Goals: Time post onset;Severity of deficits (Medical status)      Swallow Study   General HPI: Patient is an 87 year old who presented with complaints of diarrhea and abdominal pain.  Pt found to have multiorgan failure.  Pt with history of CKD, diastolic heart dysfunction, poorly controlled type 2 diabetes, hypertension, hyperlipidemia, prior CVA. Pt is also s/p H&N radiation to base of tongue in 1999; MBS 11/24/19 revealed chronic dysphagia related to long term effects of radiation. Type of Study: Bedside Swallow Evaluation Previous Swallow Assessment: MBS Nov 2021 Diet Prior to this Study: NPO Temperature Spikes Noted: No History of Recent Intubation: No Behavior/Cognition: Lethargic/Drowsy;Requires cueing;Doesn't follow directions Oral Cavity Assessment: Within Functional Limits (limited visualization of oral cavity) Oral Cavity - Dentition: Edentulous Self-Feeding Abilities: Total assist Patient Positioning: Upright in bed Baseline Vocal Quality: Normal Volitional Cough: Cognitively unable to elicit Volitional Swallow: Unable to elicit    Oral/Motor/Sensory Function Overall Oral Motor/Sensory Function: Mild impairment Facial ROM:  (Could  not test) Facial Symmetry: Abnormal symmetry left Lingual ROM:  (Could not test) Lingual Symmetry:  (Could not test) Lingual Strength:  (Could not test) Velum:  (Could not test) Mandible:  (Could not test)   Ice Chips Ice chips: Not tested   Thin Liquid Thin Liquid: Impaired Presentation: Cup;Spoon Pharyngeal  Phase Impairments: Cough - Immediate;Multiple swallows;Suspected delayed Swallow;Decreased hyoid-laryngeal movement;Wet Vocal Quality    Nectar Thick Nectar Thick Liquid: Not tested   Honey Thick Honey Thick Liquid: Not tested   Puree Puree: Impaired Pharyngeal Phase Impairments: Multiple swallows;Decreased hyoid-laryngeal movement   Solid     Solid: Not tested      Celedonio Savage, MA, [place] Office: [phone]; Pager [phone]): [phone] 12/16/2020,10:22 AM

## 2020-12-16 NOTE — Progress Notes (Addendum)
VAST consult for PIV start. Upon assessment, by Devona Konig RN earlier today and Noya Santarelli RN pt is not noted to have to viable PIV access at this time. A PICC consult has been placed.

## 2020-12-16 NOTE — Consult Note (Signed)
Consultation Note Date: 12/16/2020   Patient Name: [name]  DOB: Nov 23, 1932  MRN: [id number]  Age / Sex: 87 y.o., female  PCP: Dierdre Harness, FNP Referring Physician: Angelica Pou, MD  Reason for Consultation: Establishing goals of care and Psychosocial/spiritual support  HPI/Patient Profile:    87 y.o. year-old w/ hx of anemia, CAD, CKD, depression, HL, HTN, CVA, DM2, cirrhosis who presented to ED w/ c/o diarrhea and abdominal pain yesterday 12/02. Pt found on the floor by her family member,confused. Pt had not eaten in several days and stopped taking her medications as well.    Granddaughter reports that starting roughly Monday patient began to report feeling ill.  Granddaughter returned on Tuesday and noticed that the patient was not eating and there was diarrhea on the bed.  Worsening diarrhea continued on Wednesday, granddaughter brought patient to the emergency department for evaluation, however they left before being seen.  "waited 13 hrs"  On Thursday morning after returning from the emergency department, granddaughter returned to check on patient and found patient on floor.  .Admitted for treatment and stabilization.   Seen by nephrology, not a HD candidate due to age and comorbidities.  Seen by cardiology; EF 20% with concern for stress-induced CM, started on dobutamine.  Overall failure to thrive.  Family face treatment option decisions,, advanced directive decisions and anticipatory care needs.   Clinical Assessment and Goals of Care:   This NP Wadie Lessen reviewed medical records, received report from team, assessed the patient and then meet at the patient's bedside and then met with patient's large family (to include her grand-daughter Elgin decision maker, 3 sisters [place], [place], [place]) , a son/Albert , and a nephew/ Jasper )to discuss diagnosis,  prognosis, GOC, EOL wishes disposition and options.  Patient is lethargic and unable to participate in today's conversation.   Concept of Palliative Care was introduced as specialized medical care for people and their families living with serious illness.  If focuses on providing relief from the symptoms and stress of a serious illness.  The goal is to improve quality of life for both the patient and the family.  Values and goals of care important to patient and family were attempted to be elicited.  Created space and opportunity for  family to explore thoughts and feelings regarding current medical situation.  All family members present verbalize their love and appreciation for [name].  They all share that she has had wonderful,full life and they know that at this time understanding the current medical situation that she would wish for comfort, quality and dignity allowing for natural death.  (Sisters recently got to travel together to [place] where patient is originally from)  A  discussion was had today regarding advanced directives.  Concepts specific to code status, artifical feeding and hydration, continued IV antibiotics and rehospitalization was had.    The difference between a aggressive medical intervention path  and a palliative comfort care path for this patient at this time  was had.    All family support shift to a comfort approach today.   MOST form introduced and Hard Choices left for review.    Natural trajectory and expectations at EOL were discussed.  Questions and concerns addressed.  Patient  encouraged to call with questions or concerns.     PMT will continue to support holistically.         No documented healthcare power of attorney or advanced care planning documents noted however all family present verbalize agreement that Salvatore Decent  [phone] is spokes person and main decision maker.        SUMMARY OF RECOMMENDATIONS     Code Status/Advance Care Planning: DNR Comfort, quality and dignity or focus of care No artificial feeding or hydration now or in the future/sips as  tolerated No further diagnostics, lab draws, no IV medications Allow for a natural death    Symptom Management:  Pain/dyspnea: Dilaudid 0.5 mg IV every 2 hours as needed Agitation: Ativan 1 mg IV every 4 hours as needed Prosthetic eye care per nursing protocol   Palliative Prophylaxis:  Aspiration, Bowel Regimen, Delirium Protocol, Eye Care, Frequent Pain Assessment, and Oral Care  Additional Recommendations (Limitations, Scope, Preferences): Full Comfort Care  Psycho-social/Spiritual:  Desire for further Chaplaincy support:yes Additional Recommendations: Education on Hospice  Prognosis:  Assess in the morning for [facility] options  Discharge Planning: To Be Determined      Primary Diagnoses: Present on Admission:  Acute hypoxemic respiratory failure ([place])   I have reviewed the medical record, interviewed the patient and family, and examined the patient. The following aspects are pertinent.  Past Medical History:  Diagnosis Date   Anemia 04/03/2011   Anginal pain ([place])    a. 02/2014 MV: Low risk; b. 05/2017 MV: small area of inferior basal ischemia. EF 54% - low risk-->Med mgmt.   Anxiety    Aortic valve sclerosis w/ systolic murmur    a. 95/0932 AoV sclerosis w/o stenosis.   Arthritis    "feet" (06/02/2017)   Blind left eye 1999   CKD (chronic kidney disease) stage 3, GFR 30-59 ml/min (HCC)    Depression    Diastolic dysfunction    a. 10/2018 Echo: EF 60-65%, mild septal/posterior LVH. Impaired relaxation. Nl RV size/fxn. Mod dil LA. Trace TR. Triv AI. Mod AoV sclerosis w/o stenosis. Nl PASP.   Exertional dyspnea    GERD (gastroesophageal reflux disease)    High cholesterol    Hip fracture ([place]) 07/17/11   fall from 1-2 feet; left   Hypertension    Ischemic stroke ([place]) 05/2017   a. 06/2017 Holter: RSR, PACs, no afib.    Prosthetic eye globe 1999   "from diabetic; left eye"   Type II diabetes mellitus ([place])    not taking any medication (06/02/2017)   Social History   Socioeconomic History   Marital status: Single    Spouse name: Not on file   Number of children: Not on file   Years of education: Not on file   Highest education level: Not on file  Occupational History   Not on file  Tobacco Use   Smoking status: Former    Packs/day: 1.50    Years: 47.00    Pack years: 70.50    Types: Cigarettes    Quit date: 09/13/1997    Years since quitting: 23.2   Smokeless tobacco: Never  Vaping Use   Vaping Use: Never used  Substance and Sexual Activity   Alcohol use: Not Currently  Alcohol/week: 17.0 standard drinks    Types: 17 Shots of liquor per week    Comment: Quit 03/2018   Drug use: No    Types: Marijuana, Heroin    Comment: 06/02/2017  "last drug use was in the 1980s." Prev injected heroin.   Sexual activity: Not Currently  Other Topics Concern   Not on file  Social History Narrative   Originally from [place] but moved to [facility] @ age 87.  Moved to [place] ~ 2011 to be near grandchildren.   Social Determinants of Health   Financial Resource Strain: Not on file  Food Insecurity: Not on file  Transportation Needs: Not on file  Physical Activity: Not on file  Stress: Not on file  Social Connections: Not on file   Family History  Problem Relation Age of Onset   Diabetes type II Mother    Diabetes type II Father    Diabetes type II Sister    Colon cancer Neg Hx    Scheduled Meds:  Chlorhexidine Gluconate Cloth  6 each Topical Daily   hydrALAZINE  25 mg Oral Q8H   latanoprost  1 drop Right Eye QHS   thiamine  100 mg Oral Daily   Or   thiamine  100 mg Intravenous Daily   Continuous Infusions:  DOBUTamine 5 mcg/kg/min (12/16/20 1200)   PRN Meds:.albuterol Medications Prior to Admission:  Prior to Admission medications   Medication Sig Start Date End Date Taking? Authorizing  Provider  acetaminophen (TYLENOL) 325 MG tablet Take 2 tablets (650 mg total) by mouth every 6 (six) hours as needed for mild pain or fever (or temp > 37.5 C (99.5 F)). 06/05/17  Yes Elgergawy, Silver Huguenin, MD  albuterol (VENTOLIN HFA) 108 (90 Base) MCG/ACT inhaler Inhale 2 puffs into the lungs every 6 (six) hours as needed for wheezing or shortness of breath.   Yes [provider]  atorvastatin (LIPITOR) 40 MG tablet Take 1 tablet (40 mg total) by mouth daily at 6 PM. Patient taking differently: Take 40 mg by mouth at bedtime. 06/08/19  Yes Pahwani, Einar Grad, MD  clopidogrel (PLAVIX) 75 MG tablet Take 75 mg by mouth daily.   Yes [provider]  furosemide (LASIX) 40 MG tablet Take 40 mg by mouth.   Yes [provider]  isosorbide mononitrate (IMDUR) 60 MG 24 hr tablet TAKE 1 TABLET BY MOUTH  DAILY Patient taking differently: 60 mg daily. 12/03/20  Yes Belva Crome, MD  latanoprost (XALATAN) 0.005 % ophthalmic solution Place 1 drop into the right eye at bedtime. 05/07/20  Yes [provider]  Multiple Vitamins-Minerals (ONE-A-DAY WOMENS 50+) TABS Take 1 tablet by mouth daily with breakfast.   Yes [provider]  neomycin-polymyxin-hydrocortisone (CORTISPORIN) 3.5-10000-1 OTIC suspension Place 2-3 drops into the right ear at bedtime.   Yes [provider]  nitroGLYCERIN (NITROSTAT) 0.4 MG SL tablet Place 1 tablet (0.4 mg total) under the tongue every 5 (five) minutes as needed for chest pain. 02/11/19 12/15/20 Yes Isaiah Serge, NP  amLODipine (NORVASC) 10 MG tablet Take 1 tablet (10 mg total) by mouth daily. Patient not taking: Reported on 12/15/2020 06/09/19 11/24/19  Darliss Cheney, MD  carvedilol (COREG) 12.5 MG tablet Take 1 tablet (12.5 mg total) by mouth 2 (two) times daily with a meal. Patient not taking: Reported on 12/15/2020 06/08/19 11/24/19  Darliss Cheney, MD  folic acid (FOLVITE) 1 MG tablet Take 1 tablet (1 mg total) by mouth daily. Patient  not taking:  Reported on 05/21/2020 06/06/17   Elgergawy, Silver Huguenin, MD  gabapentin (NEURONTIN) 300 MG capsule Take 1 capsule (300 mg total) by mouth daily. Patient taking differently: Take 300 mg by mouth 3 (three) times daily. 05/25/20   Armando Reichert, MD  guaiFENesin (MUCINEX) 600 MG 12 hr tablet Take 1 tablet (600 mg total) by mouth 2 (two) times daily as needed for to loosen phlegm. Patient not taking: Reported on 12/15/2020 05/24/20   Armando Reichert, MD  hydrALAZINE (APRESOLINE) 10 MG tablet TAKE 1 TABLET(10 MG) BY MOUTH THREE TIMES DAILY Patient taking differently: Take 10 mg by mouth 3 (three) times daily. 08/01/19   Belva Crome, MD  nicotine (NICODERM CQ - DOSED IN MG/24 HR) 7 mg/24hr patch Place 1 patch (7 mg total) onto the skin daily. Patient not taking: Reported on 12/15/2020 05/25/20   Armando Reichert, MD  omeprazole (PRILOSEC) 20 MG capsule TAKE 1 CAPSULE BY MOUTH  DAILY Patient not taking: Reported on 12/15/2020 09/04/20   Isaiah Serge, NP   Allergies  Allergen Reactions   Tape Other (See Comments)    SKIN IS SENSITIVE   Review of Systems  Unable to perform ROS: Mental status change   Physical Exam Constitutional:      Appearance: She is cachectic. She is ill-appearing.  Eyes:     Comments: Noted left eye prosthetic  Cardiovascular:     Rate and Rhythm: Tachycardia present.  Skin:    General: Skin is warm and dry.  Neurological:     Mental Status: She is lethargic.    Vital Signs: BP 115/67 (BP Location: Left Arm)   Pulse 88   Temp (!) 97.5 F (36.4 C) (Oral)   Resp 18   Wt 58.4 kg   SpO2 100%   BMI 25.14 kg/m  Pain Scale: PAINAD   Pain Score: 0-No pain   SpO2: SpO2: 100 % O2 Device:SpO2: 100 % O2 Flow Rate: .O2 Flow Rate (L/min): 2 L/min  IO: Intake/output summary:  Intake/Output Summary (Last 24 hours) at 12/16/2020 1308 Last data filed at 12/16/2020 1200 Gross per 24 hour  Intake 51.27 ml  Output --  Net 51.27 ml    LBM: Last BM Date:  12/15/20 Baseline Weight: Weight: 58.4 kg Most recent weight: Weight: 58.4 kg     Palliative Assessment/Data: 30%      Discussed with Dr. Jonnie Finner, Dr Elliot Gurney and bedside RN    Time In: 1330 Time Out: 1500 Time Total: 75 minutes  Greater than 50%  of this time was spent counseling and coordinating care related to the above assessment and plan.  Signed by: Wadie Lessen, NP   Please contact Palliative Medicine Team phone at [phone] for questions and concerns.  For individual provider: See Shea Evans

## 2020-12-16 NOTE — Progress Notes (Addendum)
Patient more alert this morning. Distal LE warm b/l, significant improvement from 6 hours prior. sBP 140s. Continue on dobutamine. Monitor labs closely today. Lactate has normalized. Continue to check lactate q12 along with CMP daily. Tenuous but may be able to pull through. Would add on afterload reduction once more stable. Rounding team to see this morning. TTE today.   Called [name] and gave update. Explained that we are by no means in the clear but some of the lab work did improve with dobutamine support. Explained plan for the day and she agrees.

## 2020-12-16 NOTE — Progress Notes (Addendum)
HD#1 SUBJECTIVE:  Patient Summary: [name] is a 87 y.o. with pertinent PMH of ACS, hyperlipidemia,, stroke who presented with altered mental status and recent diarrheal illness, and admitted for dehydration, found to have significant elevations and cardiac enzymes and evidence of multiorgan dysfunction on hospital day 0.  During initial work-up in the emergency department patient was noted to have evidence of multiorgan dysfunction including liver, kidneys, cardiac as well as encephalopathy.  She was noted to be in significant heart failure with BNP over 4500, unclear as to whether heart failure was precipitated by demand ischemia in the setting of recent diarrheal illness and known prior ACS.  Cardiology and nephrology have been consulted at this time.  Felt to have low output heart failure and not a candidate for hemodialysis.  Given her evidence of multiorgan failure, and DNR status goals of care discussion planned with family for this morning.  Overnight Events: palliative care consulted.    Interm History: patient reports abd pain, no CP.   OBJECTIVE:  Vital Signs: Vitals:   12/16/20 1300 12/16/20 1400 12/16/20 1500 12/16/20 1600  BP: 124/80 (!) 151/68 (!) 132/115 (!) 148/75  Pulse: 80 93 83 90  Resp:      Temp:      TempSrc:      SpO2: 99% 97% 100% 100%  Weight:       Supplemental O2: Nasal Cannula SpO2: 100 % O2 Flow Rate (L/min): 2 L/min  Filed Weights   12/15/20 2100  Weight: 58.4 kg     Intake/Output Summary (Last 24 hours) at 12/16/2020 1817 Last data filed at 12/16/2020 1400 Gross per 24 hour  Intake 51.27 ml  Output 470 ml  Net -418.73 ml   Net IO Since Admission: -418.73 mL [12/16/20 1817]  Physical Exam: Physical Exam Constitutional:      General: She is in acute distress.     Appearance: She is obese. She is ill-appearing. She is not toxic-appearing or diaphoretic.  HENT:     Head: Atraumatic.     Mouth/Throat:     Mouth: Mucous membranes  are dry.     Pharynx: Oropharynx is clear.     Comments: edentulous Eyes:     Comments: Prosthetic left eye  Neck:     Comments: Firm symmetric submandibular mass Cardiovascular:     Rate and Rhythm: Normal rate and regular rhythm.     Heart sounds:    No gallop.     Comments: Distal pulses lower extremities decreased Pulmonary:     Breath sounds: Normal breath sounds. No wheezing or rhonchi.     Comments: 2L [place] Abdominal:     General: Abdomen is flat. Bowel sounds are normal. There is no distension.     Palpations: Abdomen is soft. There is no mass.     Tenderness: There is abdominal tenderness. There is no guarding.     Hernia: No hernia is present.  Musculoskeletal:        General: No swelling, tenderness, deformity or signs of injury.     Cervical back: Normal range of motion and neck supple.  Skin:    General: Skin is dry.     Findings: No bruising or erythema.     Comments: cool  Neurological:     General: No focal deficit present.     Mental Status: She is disoriented.  Psychiatric:     Comments: Unable to assess    Patient Lines/Drains/Airways Status     Active Line/Drains/Airways  Name Placement date Placement time Site Days   Peripheral IV 12/15/20 22 G 1.75" Anterior;Right Forearm 12/15/20  0833  Forearm  1   Peripheral IV 12/16/20 22 G 1" Right;Anterior Forearm 12/16/20  0003  Forearm  less than 1   Urethral Catheter Juliann Pulse RN Latex 14 Fr. 12/15/20  2339  Latex  1   Pressure Injury 12/15/20 Sacrum Lower;Mid Stage 2 -  Partial thickness loss of dermis presenting as a shallow open injury with a red, pink wound bed without slough. 12/15/20  1823  -- 1   Pressure Injury 12/15/20 Perineum Right;Left Stage 2 -  Partial thickness loss of dermis presenting as a shallow open injury with a red, pink wound bed without slough. 12/15/20  1823  -- 1            Pertinent Labs: CBC Latest Ref Rng & Units 12/14/2020 12/12/2020 07/13/2020  WBC 4.0 - 10.5 K/uL 9.0 8.4 6.9   Hemoglobin 12.0 - 15.0 g/dL 11.3(L) 10.6(L) 11.3(L)  Hematocrit 36.0 - 46.0 % 35.2(L) 32.7(L) 36.4  Platelets 150 - 400 K/uL 198 259 267    CMP Latest Ref Rng & Units 12/15/2020 07/13/2020 05/24/2020  Glucose 70 - 99 mg/dL 148(H) 119(H) 92  BUN 8 - 23 mg/dL 136(H) 28(H) 26(H)  Creatinine 0.44 - 1.00 mg/dL 4.49(H) 2.02(H) 2.41(H)  Sodium 135 - 145 mmol/L 146(H) 140 133(L)  Potassium 3.5 - 5.1 mmol/L 4.2 4.0 3.7  Chloride 98 - 111 mmol/L 108 104 102  CO2 22 - 32 mmol/L 17(L) 28 23  Calcium 8.9 - 10.3 mg/dL 9.5 9.3 8.6(L)  Total Protein 6.5 - 8.1 g/dL 7.6 7.0 -  Total Bilirubin 0.3 - 1.2 mg/dL 1.4(H) 0.5 -  Alkaline Phos 38 - 126 U/L 53 43 -  AST 15 - 41 U/L 434(H) 41 -  ALT 0 - 44 U/L 300(H) 18 -    Recent Labs    12/16/20 1413  GLUCAP 125*     Pertinent Imaging: CT HEAD WO CONTRAST (5MM)  Result Date: 12/16/2020 CLINICAL DATA:  Abnormal CT neck with subdural hematoma EXAM: CT HEAD WITHOUT CONTRAST TECHNIQUE: Contiguous axial images were obtained from the base of the skull through the vertex without intravenous contrast. COMPARISON:  May 2021; correlation made with CT neck earlier same day FINDINGS: Brain: There is no acute intracranial hemorrhage. Increased prominence of the extra-axial space along the left cerebral convexity compared to 2021. The isodensity seen on the neck study is likely artifactual reflecting streak from adjacent bone. There is no significant mass effect. No new loss of gray-white differentiation. Patchy hypoattenuation in the supratentorial white matter is nonspecific but probably reflects chronic microvascular ischemic changes. Prominence of the ventricles and sulci reflects parenchymal volume loss. There is prominent dural ossification. Vascular: There is atherosclerotic calcification at the skull base. Skull: Calvarium is unremarkable. Sinuses/Orbits: No acute finding. Other: None. IMPRESSION: Increased prominence of the extra-axial space along the left cerebral  convexity compared to 2021. A thin subdural hygroma is not excluded and there is no mass effect. The increased density seen on the neck CT is likely artifactual. No acute intracranial hemorrhage. Chronic microvascular ischemic changes. Electronically Signed   By: Macy Mis M.D.   On: 12/16/2020 12:59   CT SOFT TISSUE NECK WO CONTRAST  Addendum Date: 12/16/2020   ADDENDUM REPORT: 12/16/2020 11:29 ADDENDUM: Study discussed by telephone with Dr. Elliot Gurney on 12/16/2020 at 1123 hours. Electronically Signed   By: Genevie Ann M.D.   On: 12/16/2020 11:29  Result Date: 12/16/2020 CLINICAL DATA:  87 year old female with altered mental status. Failed swallow study. EXAM: CT NECK WITHOUT CONTRAST TECHNIQUE: Multidetector CT imaging of the neck was performed following the standard protocol without intravenous contrast. COMPARISON:  Brain MRI 04/11/2019. FINDINGS: Pharynx and larynx: Mild motion artifact. No laryngeal or pharyngeal mass. Noncontrast parapharyngeal and retropharyngeal spaces remain normal. Salivary glands: Sublingual calcifications appear to be vascular atherosclerosis rather than sialolithiasis. Otherwise negative noncontrast sublingual space. However, both submandibular glands are fatty atrophied and also demonstrate dystrophic calcification. Parotid glands are also fatty atrophied. No parotid sialolithiasis. Thyroid: Diminutive, negative. Lymph nodes: No cervical lymphadenopathy is evident in the absence of IV contrast. Vascular: Generalized arterial tortuosity including the brachiocephalic artery exerting mild mass effect on the trachea at the thoracic inlet. Bulky calcified atherosclerosis at both carotid bifurcations. Additional widespread small vessel calcified atherosclerosis in the neck and visible scalp. Vascular patency is not evaluated in the absence of IV contrast. Limited intracranial: Extensive Calcified atherosclerosis at the skull base. Bulky dural calcification also. Partially visible  isodense subdural hematoma in the left hemisphere, proximally 6 mm in thickness on coronal image 48. No definite contralateral right hemisphere subdural. But no midline shift. No ventriculomegaly. Patchy and confluent cerebral white matter hypodensity. Visualized orbits: Left orbit prosthesis. Rightward gaze of the right globe and orbit soft tissues which appear negative. Mastoids and visualized paranasal sinuses: Small layering fluid level in the left maxillary sinus. Other Visualized paranasal sinuses and mastoids are stable and well aerated. Skeleton: Mandible motion artifact. Absent dentition. Cervical spine disc and endplate degeneration. No acute osseous abnormality identified. Upper chest: Calcified aortic atherosclerosis. Small layering pleural effusions. Visible major airways are clear. No superior mediastinal lymphadenopathy. IMPRESSION: 1. Partially visible isodense left hemisphere subdural hematoma measuring about 6 mm in thickness. This is new since last year but age indeterminate, and no midline shift is evident. Recommend follow-up noncontrast head CT. 2. No acute finding in the noncontrast neck. Salivary gland atrophy. Submandibular and sublingual sialolithiasis versus small vessel calcified atherosclerosis (see #3). 3. Severe calcified atherosclerosis in the head and neck. Aortic Atherosclerosis (ICD10-I70.0). 4. Small layering pleural effusions. Electronically Signed: By: Genevie Ann M.D. On: 12/16/2020 11:03   US RENAL  Result Date: 12/15/2020 CLINICAL DATA:  1.4 cm hyperdense cyst or mass right kidney on CT today. Hydronephrosis. EXAM: RENAL / URINARY TRACT ULTRASOUND COMPLETE COMPARISON:  CT earlier today, renal ultrasound 04/10/2019. FINDINGS: Right Kidney: Renal measurements: 7.6 x 3.8 x 4.0 cm = volume: 59.9 mL. There is normal cortical echogenicity with mild general cortical thinning. Corresponding to the hyperdense lesion on CT, there is a 1.6 cm simple cyst in the lateral mid pole area.  There is no suspicious sonographic mass, no calculus or hydronephrosis is seen. Left Kidney: Renal measurements: 9.0 x 4.6 x 4.5 cm = volume: 95.5 mL. Echogenicity within normal limits. No mass, stone or hydronephrosis visualized. Bladder: Appears normal for degree of bladder distention. Bladder volume is 104.46 mL. Ureteral jets were not seen. Other: No perinephric or pelvic free fluid. IMPRESSION: 1. No increased cortical echogenicity. Mild cortical volume loss on the right. 2. 1.6 cm simple cyst right kidney corresponding to hyperdense lesion on CT. 3. Unremarkable bladder aside from ureteral jets not being seen. No hydronephrosis. Electronically Signed   By: Telford Nab M.D.   On: 12/15/2020 22:39   ECHOCARDIOGRAM COMPLETE  Result Date: 12/16/2020    ECHOCARDIOGRAM REPORT   Patient Name:   RENETA NIEHAUS Date of Exam: 12/16/2020 Medical Rec #:  [id number]        Height:       60.0 in Accession #:    [id number]       Weight:       128.7 lb Date of Birth:  October 07, 1932        BSA:          1.548 m Patient Age:    8 years         BP:           122/104 mmHg Patient Gender: F                HR:           90 bpm. Exam Location:  Inpatient Procedure: 2D Echo, Cardiac Doppler, Color Doppler and 3D Echo Indications:    CHF  History:        Patient has prior history of Echocardiogram examinations, most                 recent 05/22/2020. CHF, CAD, COPD and Stroke, Aortic Valve                 Disease; Risk Factors:Hypertension, Dyslipidemia and Current                 Smoker.  Sonographer:    Merrie Roof RDCS Referring Phys: [place]  1. Left ventricular ejection fraction, by estimation, is 25 to 30%. The left ventricle has severely decreased function. The left ventricle demonstrates global hypokinesis. There is mild concentric left ventricular hypertrophy. Left ventricular diastolic  parameters are consistent with Grade II diastolic dysfunction (pseudonormalization).  2. Right ventricular  systolic function is mildly reduced. The right ventricular size is normal. There is moderately elevated pulmonary artery systolic pressure. The estimated right ventricular systolic pressure is 26.9 mmHg.  3. Left atrial size was severely dilated.  4. The mitral valve is degenerative. Moderate mitral valve regurgitation. Severe mitral annular calcification.  5. Tricuspid valve regurgitation is moderate.  6. The aortic valve is calcified. There is severe calcification most notably on the RCC/NCC of the aortic valve. There is moderate thickening of the aortic valve. Aortic valve regurgitation is moderate. Mild aortic valve stenosis.  7. The inferior vena cava is dilated in size with <50% respiratory variability, suggesting right atrial pressure of 15 mmHg.  8. Evidence of atrial level shunting detected by color flow Doppler. Comparison(s): Compared to prior TTE in 05/2020, the EF has dropped to 25-30% (previously 50-55%) with global hypokinesis, there is now mild RV dysfunction, moderate MR (previously trivial), moderate TR (previously mild), and moderate PHTN (previous PASP 26.37mmHg). FINDINGS  Left Ventricle: Left ventricular ejection fraction, by estimation, is 25 to 30%. The left ventricle has severely decreased function. The left ventricle demonstrates global hypokinesis. 3D left ventricular ejection fraction analysis performed but not reported based on interpreter judgement due to suboptimal tracking. The left ventricular internal cavity size was normal in size. There is mild concentric left ventricular hypertrophy. Left ventricular diastolic parameters are consistent with Grade II diastolic dysfunction (pseudonormalization). Right Ventricle: The right ventricular size is normal. No increase in right ventricular wall thickness. Right ventricular systolic function is mildly reduced. There is moderately elevated pulmonary artery systolic pressure. The tricuspid regurgitant velocity is 3.23 m/s, and with an assumed  right atrial pressure of 15 mmHg, the estimated right ventricular systolic pressure is 48.5 mmHg. Left Atrium: Left atrial size was severely dilated. Right Atrium: Right atrial size was normal in size.  Pericardium: There is no evidence of pericardial effusion. Mitral Valve: The mitral valve is degenerative in appearance. There is moderate thickening of the mitral valve leaflet(s). There is moderate calcification of the mitral valve leaflet(s). Severe mitral annular calcification. Moderate mitral valve regurgitation. Tricuspid Valve: The tricuspid valve is normal in structure. Tricuspid valve regurgitation is moderate. Aortic Valve: The aortic valve is calcified. There is severe calcification most notably on the RCC/NCC of the aortic valve. There is moderate thickening of the aortic valve. Aortic valve regurgitation is moderate. Aortic regurgitation PHT measures 478 msec. Mild aortic stenosis is present. Aortic valve mean gradient measures 10.5 mmHg. Aortic valve peak gradient measures 22.8 mmHg. Pulmonic Valve: The pulmonic valve was normal in structure. Pulmonic valve regurgitation is mild to moderate. Aorta: The aortic root is normal in size and structure. Venous: The inferior vena cava is dilated in size with less than 50% respiratory variability, suggesting right atrial pressure of 15 mmHg. IAS/Shunts: There is right bowing of the interatrial septum, suggestive of elevated left atrial pressure. Evidence of atrial level shunting detected by color flow Doppler.  LEFT VENTRICLE PLAX 2D LVIDd:         4.70 cm LVIDs:         3.90 cm LV PW:         1.30 cm LV IVS:        1.20 cm                             3D Volume EF:                             3D EF:        31 % LV Volumes (MOD)            LV EDV:       109 ml LV vol d, MOD A2C: 72.9 ml  LV ESV:       75 ml LV vol d, MOD A4C: 110.0 ml LV SV:        34 ml LV vol s, MOD A2C: 37.2 ml LV vol s, MOD A4C: 89.9 ml LV SV MOD A2C:     35.7 ml LV SV MOD A4C:     110.0 ml LV  SV MOD BP:      34.5 ml RIGHT VENTRICLE            IVC RV Basal diam:  3.80 cm    IVC diam: 2.40 cm RV S prime:     9.36 cm/s TAPSE (M-mode): 1.8 cm LEFT ATRIUM             Index        RIGHT ATRIUM           Index LA diam:        5.00 cm 3.23 cm/m   RA Area:     14.90 cm LA Vol (A2C):   98.0 ml 63.31 ml/m  RA Volume:   34.80 ml  22.48 ml/m LA Vol (A4C):   81.6 ml 52.72 ml/m LA Biplane Vol: 94.0 ml 60.73 ml/m  AORTIC VALVE AV Vmax:           238.50 cm/s AV Vmean:          148.000 cm/s AV VTI:            0.349 m AV Peak Grad:  22.8 mmHg AV Mean Grad:      10.5 mmHg LVOT Vmax:         108.00 cm/s LVOT Vmean:        58.200 cm/s LVOT VTI:          0.127 m LVOT/AV VTI ratio: 0.36 AI PHT:            478 msec  AORTA Ao Root diam: 3.20 cm MITRAL VALVE                  TRICUSPID VALVE MV Area (PHT): 5.66 cm       TR Peak grad:   41.7 mmHg MV Decel Time: 134 msec       TR Vmax:        323.00 cm/s MR Peak grad:    100.0 mmHg MR Mean grad:    72.0 mmHg    SHUNTS MR Vmax:         500.00 cm/s  Systemic VTI: 0.13 m MR Vmean:        394.0 cm/s MR PISA:         1.01 cm MR PISA Eff ROA: 7 mm MR PISA Radius:  0.40 cm MV E velocity: 117.00 cm/s MV A velocity: 92.20 cm/s MV E/A ratio:  1.27 Gwyndolyn Kaufman MD Electronically signed by Gwyndolyn Kaufman MD Signature Date/Time: 12/16/2020/12:25:21 PM    Final    [place] EKG SITE RITE  Result Date: 12/16/2020 If Site Rite image not attached, placement could not be confirmed due to current cardiac rhythm.   ASSESSMENT/PLAN:  Assessment: Principal Problem:   Acute hypoxemic respiratory failure (HCC) Active Problems:   Pressure injury of skin   [name] is a 87 y.o. with pertinent PMH of ACS, hyperlipidemia,, stroke who presented with altered mental status and recent diarrheal illness, and admitted for dehydration, found to have significant elevations and cardiac enzymes and evidence of multiorgan dysfunction on hospital day 0.  During initial work-up in the  emergency department patient was noted to have evidence of multiorgan dysfunction including liver, kidneys, cardiac as well as encephalopathy.  She was noted to be in significant heart failure with BNP over 4500, unclear as to whether heart failure was precipitated by demand ischemia in the setting of recent diarrheal illness and known prior ACS.  Cardiology and nephrology have been consulted at this time.  Felt to have low output heart failure and not a candidate for hemodialysis.  Given her evidence of multiorgan failure, and DNR status and goals of care discussed with family. Palliative consulted.  After discussion with palliative, family elected for comfort measures. Will continue to evaluate patient, family considering hospice  Plan:   Stress-induced cardiomyopathy, likely precipitated by previous diarrheal illness Low output heart failure seen on bedside echo with evidence of endorgan damage - patient initially started on dobutamine for low output cardiac heart failure seen on bedside ECHO. Formal ECHO performed showing EF 25%.  - initial improvement in lactic acid noted on labs, however, additional labs were not able to be drawn. PICC line was considered for continued blood draws, however, after discussions with family, family elected for comfort measures. PICC not purused and dobutamine Dc'd   Acute renal failure Significant elevation in creatinine up to 4.5 from baseline of 2.  She is uremic at 136, concerned that this may be contributing to encephalopathy.  Potassium is currently within normal limits at 4.2. - Nephrology has been consulted, and she is not a candidate for hemodialysis at  this time -Perfusion appears to be improving due to dramatic decrease and lactic acid to less than 0.3 with initiation of dobutamine.,  Given that this is in acute illness, would like to monitor for improvement in renal function as she may be able to improve, however morning labs were unable to be drawn due to  patient's hydration status.  Unfortunately blood draws have been difficult to get due to patient's dehydration and patient is requiring a PICC line, however, family elected for comfort measures.    LFT elevation with concern for hepatic failure secondary to congestive hepatopathy. Concern for congestive hepatopathy with AST 434, ALT 300 secondary to significant congestive heart failure.  She has elevation in her PT/INR.  Despite elevation in PT/INR no history of bleeding per granddaughter, and feel benefit of starting heparin currently outweighs risks.  Ammonia is elevated to 40 as well.  It is possible that this elevation in ammonia is contributing to her altered mental status. -Continue to monitor   Acute metabolic encephalopathy Consider hepatic encephalopathy and likely multifactorial uremia, also likely related to heart failure. WBC 9.0, and the patient has not been febrile do not feel her encephalopathy is related to infection at this time. -Anticipate this will resolve as organ function improves.  She has a history of cerebrovascular accident, her mental status is likely more related to metabolic causes, head CT obtained showing subdural hematoma increased from 2021, but no acute bleeding. No midline shift.  Subdural hematoma - Increasd compared to head imaging 2021, however, no acute bleed. no midline shift   Best Practice: Comfort measures.  Signature: Delene Ruffini, MD  Internal Medicine Resident, PGY-1 [facility]  Pager: [phone] 6:17 PM, 12/16/2020   Please contact the on call pager after 5 pm and on weekends at [phone].

## 2020-12-16 NOTE — Progress Notes (Addendum)
[facility] Progress Note  Subjective: bedside echo showed EF 20% last night, started on low dose dobutamine at 5 ug/kg/ min. No UOP recorded, foley cath in place.   Vitals:   12/16/20 0900 12/16/20 1100 12/16/20 1200 12/16/20 1300  BP: (!) 143/111 (!) 122/104 115/67 124/80  Pulse: 90 96 88 80  Resp: (!) 21 16 18    Temp:   (!) 97.5 F (36.4 C)   TempSrc:   Oral   SpO2: 100% 100% 100% 99%  Weight:        Exam:   Frail eldelry AAF, not responding to voice  no jvd  Chest cta ant/ lat  Cor reg no RG  Abd soft ntnd no ascites   Ext no LE edema   Less responsive today   deconditioned     Home meds include - plavix, eyedrops, lipitor, lasix 40 qd, sl ntg prn, norvasc, coreg 12.5 bid, neurontin 300 tid, hydralazine 10 tid, prilosec, prns/ vits / supps      Date                          Creat               eGFR   2008- 2015                0.90- 1.11           2016- 2017                1.02- 1.40   2018- 2019                0.91- 1.56   Jan- feb 2020            1.31- 1.93   Oct -dec 2020           1.47- 3.01   Jan -may 2021          1.46- 2.44   Nov 2021                   1.88 >> 1.35    AKI episode   May 2022                  1.81- 2.76        19- 27 ml/min   July 2022                   2.02                 24 ml/min    Dec 15 2020               4.49                 9 ml/min         CT abdomen - IMPRESSION: 1.4 cm hyperdense right renal mass...may represent a hemorrhagic or proteinaceous cyst, although renal cell carcinoma cannot be excluded. Enlarged heart with mild interstitial edema and bilateral small pleural effusions. Anasarca. Calcific atherosclerotic disease of the aorta and coronary arteries.     Last UA 07/13/20 = > 300 prot, mod LE 0-5 rbc, >50 wbc     Abd CT > Urinary Tract: 1.4 cm hyperdense right renal mass in the lower pole of the right kidney. No evidence of hydronephrosis. Normal urinary bladder       CXR - no edema, possibly very small effusion  Last ECHO May 2022 - LVEF 50-55%        Heart cath march 2021 - severe 100% RCA occlusion, unable to pass a wire, 30% prox Cx and 30% mid LAD.        UNa <10, UCr 101   Assessment/ Plan: AKI on CKD IV - b/l creatinine 1.81- 2.76 from may 2022, eGFR 19- 27 ml/min. Here w/ AMS in setting of several days of diarrhea at home. Creat 4.5 on admit. No obstruction by imaging. No UA yet. Got 2 L bolus in ED for high lactic acid. BP's high in ED. Cardiology concerned w/ ^LFT"s about hepatic congestion/ low output HF. Bedside echo confirmed LVEF only 20%. Pt was started on IV dobutamine at low dose 5 ug/ kg/ min overnight. UNa is low consistent w/ CHF and low output. Not making urine overnight. Labs pending for today. Pt not a HD candidate due to age and comorbidities. Pt is DNR w/ no plans for escalation of care. Per palliative care meeting today, plan is shift to comfort / hospice care. Will sign off.  AMS - probably multifactorial, uremia is likely BP/ volume - BP's normal to high, euvolemic on exam  H/o CVA Lactic acidosis - per primary team, r/o sepsis. Blood cx's sent.  Diarrhea            Rob Ceferino Lang 12/16/2020, 1:30 PM   Recent Labs  Lab 12/12/20 1536 12/14/20 1846 12/15/20 0722 12/15/20 2249  K  --   --  4.2  --   BUN  --   --  136*  --   CREATININE  --   --  4.49*  --   CALCIUM  --   --  9.5  --   PHOS  --   --   --  9.4*  HGB 10.6* 11.3*  --   --    Inpatient medications:  Chlorhexidine Gluconate Cloth  6 each Topical Daily   hydrALAZINE  25 mg Oral Q8H   latanoprost  1 drop Right Eye QHS   thiamine  100 mg Oral Daily   Or   thiamine  100 mg Intravenous Daily    DOBUTamine 5 mcg/kg/min (12/16/20 1200)   albuterol

## 2020-12-16 NOTE — Progress Notes (Addendum)
Progress Note  Patient Name: [name] Date of Encounter: 12/16/2020  [facility] Cardiologist: Belva Crome III, MD   Subjective   Somnolent and not responding to questions.  Remains on dobutamine gtt at 5 Lactate now normal UOP not recorded Warm on exam  Inpatient Medications    Scheduled Meds:  aspirin  325 mg Oral Daily   folic acid  1 mg Oral Daily   heparin injection (subcutaneous)  5,000 Units Subcutaneous Q8H   latanoprost  1 drop Right Eye QHS   multivitamin with minerals  1 tablet Oral Daily   thiamine  100 mg Oral Daily   Or   thiamine  100 mg Intravenous Daily   Continuous Infusions:  DOBUTamine 5 mcg/kg/min (12/16/20 0004)   PRN Meds: albuterol   Vital Signs    Vitals:   12/15/20 2100 12/15/20 2304 12/16/20 0405 12/16/20 0721  BP:  (!) 157/75 (!) 155/60 (!) 148/108  Pulse:  77 71 94  Resp:  20 17 20   Temp:  (!) 97.5 F (36.4 C) (!) 97.5 F (36.4 C) (!) 96.5 F (35.8 C)  TempSrc:  Oral Oral Oral  SpO2:  98% 100% 97%  Weight: 58.4 kg      No intake or output data in the 24 hours ending 12/16/20 1101 Last 3 Weights 12/15/2020 07/13/2020 05/24/2020  Weight (lbs) 128 lb 12 oz 124 lb 12.5 oz 124 lb 12.5 oz  Weight (kg) 58.4 kg 56.6 kg 56.6 kg      Telemetry    NSR, brief runs SVT, PVCs, PACs - Personally Reviewed  ECG    No new tracing - Personally Reviewed  Physical Exam   GEN: Somnolent, frail appearing, not responding to questions Neck: No JVD Cardiac: RR,   no murmurs Respiratory: Clear to auscultation bilaterally. GI: Soft, nontender, non-distended  MS: No edema; No deformity. Neuro:  Somnolent, not following commands Psych: Unable to assess  Labs    High Sensitivity Troponin:   Recent Labs  Lab 12/15/20 1851 12/15/20 2249  TROPONINIHS 302* 392*     Chemistry Recent Labs  Lab 12/15/20 0722 12/15/20 2249  NA 146*  --   K 4.2  --   CL 108  --   CO2 17*  --   GLUCOSE 148*  --   BUN 136*  --   CREATININE  4.49*  --   CALCIUM 9.5  --   MG  --  3.0*  PROT 7.6  --   ALBUMIN 3.7  --   AST 434*  --   ALT 300*  --   ALKPHOS 53  --   BILITOT 1.4*  --   GFRNONAA 9*  --   ANIONGAP 21*  --     Lipids No results for input(s): CHOL, TRIG, HDL, LABVLDL, LDLCALC, CHOLHDL in the last 168 hours.  Hematology Recent Labs  Lab 12/12/20 1536 12/14/20 1846  WBC 8.4 9.0  RBC 3.65* 3.92  HGB 10.6* 11.3*  HCT 32.7* 35.2*  MCV 89.6 89.8  MCH 29.0 28.8  MCHC 32.4 32.1  RDW 16.5* 16.5*  PLT 259 198   Thyroid No results for input(s): TSH, FREET4 in the last 168 hours.  BNP Recent Labs  Lab 12/15/20 1851  BNP >4,500.0*    DDimer No results for input(s): DDIMER in the last 168 hours.   Radiology    CT ABDOMEN PELVIS WO CONTRAST  Result Date: 12/15/2020 CLINICAL DATA:  Abdominal pain and vomiting. EXAM: CT ABDOMEN AND PELVIS  WITHOUT CONTRAST TECHNIQUE: Multidetector CT imaging of the abdomen and pelvis was performed following the standard protocol without IV contrast. COMPARISON:  July 10, 2014 FINDINGS: Lower chest: Enlarged heart. Calcific atherosclerotic disease of the aorta and coronary arteries. Bilateral small pleural effusions. Mild interstitial edema. Hepatobiliary: Normal nonenhanced appearance of the liver. Layering gallstones or sludge in the dependent portion of the gallbladder. Pancreas: Unremarkable. No pancreatic ductal dilatation or surrounding inflammatory changes. Spleen: Small spleen. Adrenals/Urinary Tract: No adrenal masses. 1.4 cm hyperdense right renal mass in the lower pole of the right kidney. No evidence of hydronephrosis. Normal urinary bladder. Stomach/Bowel: Stomach is within normal limits. No evidence of appendicitis. No evidence of bowel wall thickening, distention, or inflammatory changes. Vascular/Lymphatic: Heavy atherosclerosis and tortuosity of the aorta and its main branches. No enlarged abdominal or pelvic lymph nodes. Reproductive: 5.7 cm fat density pelvic mass,  stable. Other: Fat containing periumbilical anterior abdominal wall hernia. Musculoskeletal: Mild superior endplate compression deformity of T12 vertebral body, stable. IMPRESSION: 1. No evidence of acute abnormalities within the solid abdominal organs. 2. 1.4 cm hyperdense right renal mass in the lower pole of the right kidney. This may represent a hemorrhagic or proteinaceous cyst, however renal cell carcinoma cannot be excluded. 3. Enlarged heart with mild interstitial edema and bilateral small pleural effusions. 4. Anasarca. 5. Calcific atherosclerotic disease of the aorta and coronary arteries. 6. Fat containing periumbilical anterior abdominal wall hernia. 7. Mild superior endplate compression deformity of T12 vertebral body, stable. Aortic Atherosclerosis (ICD10-I70.0). Electronically Signed   By: Fidela Salisbury M.D.   On: 12/15/2020 13:51   US RENAL  Result Date: 12/15/2020 CLINICAL DATA:  1.4 cm hyperdense cyst or mass right kidney on CT today. Hydronephrosis. EXAM: RENAL / URINARY TRACT ULTRASOUND COMPLETE COMPARISON:  CT earlier today, renal ultrasound 04/10/2019. FINDINGS: Right Kidney: Renal measurements: 7.6 x 3.8 x 4.0 cm = volume: 59.9 mL. There is normal cortical echogenicity with mild general cortical thinning. Corresponding to the hyperdense lesion on CT, there is a 1.6 cm simple cyst in the lateral mid pole area. There is no suspicious sonographic mass, no calculus or hydronephrosis is seen. Left Kidney: Renal measurements: 9.0 x 4.6 x 4.5 cm = volume: 95.5 mL. Echogenicity within normal limits. No mass, stone or hydronephrosis visualized. Bladder: Appears normal for degree of bladder distention. Bladder volume is 104.46 mL. Ureteral jets were not seen. Other: No perinephric or pelvic free fluid. IMPRESSION: 1. No increased cortical echogenicity. Mild cortical volume loss on the right. 2. 1.6 cm simple cyst right kidney corresponding to hyperdense lesion on CT. 3. Unremarkable bladder  aside from ureteral jets not being seen. No hydronephrosis. Electronically Signed   By: Telford Nab M.D.   On: 12/15/2020 22:39   DG CHEST PORT 1 VIEW  Result Date: 12/15/2020 CLINICAL DATA:  Increased weakness, poor p.o. intake, shortness of breath for 3 days. EXAM: PORTABLE CHEST 1 VIEW COMPARISON:  Chest x-rays dated 12/12/2020 and 09/18/2020. FINDINGS: Stable cardiomegaly. Lungs are clear. No pleural effusion or pneumothorax is seen. Osseous structures about the chest are unremarkable. IMPRESSION: 1. No active disease. No evidence of pneumonia. 2. Stable cardiomegaly. Electronically Signed   By: Franki Cabot M.D.   On: 12/15/2020 16:44    Cardiac Studies   TTE 05/22/20 Result date: 05/22/20  1. Left ventricular ejection fraction, by estimation, is 50 to 55%. Left  ventricular ejection fraction by 3D volume is 49 %. The left ventricle has  low normal function. The left ventricle demonstrates  regional wall motion  abnormalities (see scoring  diagram/findings for description). Left ventricular diastolic parameters  are consistent with Grade I diastolic dysfunction (impaired relaxation).  Elevated left atrial pressure. There is mild hypokinesis of the left  ventricular, basal inferoseptal wall,  inferior wall and inferolateral wall.   2. Right ventricular systolic function is normal. The right ventricular  size is normal. There is normal pulmonary artery systolic pressure.   3. Left atrial size was mildly dilated.   4. The mitral valve is normal in structure. Trivial mitral valve  regurgitation. No evidence of mitral stenosis. Moderate mitral annular  calcification.   5. The aortic valve is tricuspid. There is mild calcification of the  aortic valve. There is moderate thickening of the aortic valve. Aortic  valve regurgitation is mild. Mild aortic valve stenosis.   6. The inferior vena cava is dilated in size with <50% respiratory  variability, suggesting right atrial pressure of 15  mmHg.   Comparison(s): No significant change from prior study. Prior images  reviewed side by side.   LHC 04/08/2019 Ost RCA to Mid RCA lesion is 100% stenosed. Prox Cx lesion is 30% stenosed. Mid LAD lesion is 30% stenosed.  Patient Profile     87 y.o. female with known CAD, HTN, HLD, CKD stage 3, DMII who presented with weakness, poor PO intake and SOB in the setting of diarrhea found to have elevated trop 392>302, lactate 5, BNP >4500. Cardiology was consulted for concern for elevated troponin. Course complicated by newly reduced LVEF by bedside TTE (20%) with concern for low output shock now on dobutamine with improvement. Of note, patient is DNR with no further escalation of care.  Assessment & Plan     #Concern for low output HF: #Newly reduced LVEF with possible stress CM: #Elevated troponin: Patient presented with general decline over the past week with poor PO intake, diarrhea, and weakness found to have multiorgan failure with lactate 5, AKI on CKD, transaminitis and elevated troponin. Bedside TTE overnight with EF 20% with concern for possible stress induced CM. Was initiated on dobutamine gtt at that time. Lactate improving this AM. Overall, patient remains minimally responsive. She is notably DNR/DNI with no plans to escalate care further. Formal TTE pending. -Continue dobutamine gtt for now; will attempt to wean tomorrow if remains stable -Will add hydralazine 25mg  TID for afterload reduction -Holding diuresis for now as dry on admission; likely needs tomorrow -Do not suspect ACS as trop flat; likely demand in the setting of multiorgan dysfunction and possible stress CM -Patient is not a candidate for invasive measures and is DNR without plans for escalation of care; will continue conservative management  #CAD: LHC 03/2019 with 100% ostial-mid RCA and mild disease in LAD and Lcx. Likely trop on this admission secondary to demand in the setting of acute illness, multiorgan  failure and suspected stress CM. -Not a candidate for invasive procedures given overall poor prognosis; will continue with conservative management -Change ASA to 81mg  daily -Holding statin due to transaminitis -No BB as patient on dobutamine -No ARB/ACE due to AKI  #Acute Renal Failure on CKD: Not candidate for HD. -Continue conservative management  #Transaminitis: Likely shock liver in the setting of multiorgan failure and suspected newly reduced EF. -Management as above   For questions or updates, please contact [facility] Please consult [email or web address] for contact info under        Signed, Freada Bergeron, MD  12/16/2020, 11:01 AM

## 2020-12-16 NOTE — Progress Notes (Signed)
Secure chat with Wadie Lessen NP and Dr Jonnie Finner.  To cancel PICC post conversation with family re Hospice.

## 2020-12-17 DIAGNOSIS — J9601 Acute respiratory failure with hypoxia: Secondary | ICD-10-CM

## 2020-12-17 MED ORDER — BLISTEX MEDICATED EX OINT: 1.0000 "application " | TOPICAL_OINTMENT | CUTANEOUS | 0 refills | Status: DC | PRN

## 2020-12-17 MED ORDER — LORAZEPAM 2 MG/ML IJ SOLN
1.0000 mg | INTRAMUSCULAR | 0 refills | Status: DC | PRN
Start: 2020-12-17 — End: 2022-04-29

## 2020-12-17 MED ORDER — HYDROMORPHONE HCL 1 MG/ML IJ SOLN: 0.5000 mg | INTRAMUSCULAR | 0 refills | Status: DC | PRN

## 2020-12-17 MED ORDER — LATANOPROST 0.005 % OP SOLN: 1.0000 [drp] | Freq: Every day | OPHTHALMIC | 12 refills | Status: DC

## 2020-12-17 NOTE — Progress Notes (Signed)
Patient ID: Carmella Kees, female   DOB: 18-Dec-1932, 87 y.o.   MRN: [id number]    Progress Note from the Palliative Medicine Team at [facility]   Patient Name: [name]        Date: 12/17/2020 DOB: April 18, 1932  Age: 87 y.o. MRN#: [id number] Attending Physician: Axel Filler, * Primary Care Physician: Dierdre Harness, FNP Admit Date: 12/14/2020   Medical records reviewed   87 y.o. year-old w/ hx of anemia, CAD, CKD, depression, HL, HTN, CVA, DM2, cirrhosis who presented to ED w/ c/o diarrhea and abdominal pain yesterday 12/02. Pt found on the floor by her family member,confused. Pt had not eaten in several days and stopped taking her medications as well.  Family report worsening diarrhea, attempted to be seen in the emergency room but left before exam secondary to long wait.   On Thursday morning after returning from the emergency department, granddaughter returned to check on patient and found patient on floor.   .Admitted for treatment and stabilization.   Seen by nephrology, not a HD candidate due to 87 and comorbidities.  Seen by cardiology; EF 20% with concern for stress-induced CM, started on dobutamine.  Overall failure to thrive.   Family made decision to shift focus to comfort and dignity, allowing for a natural death.  Exam:  Patient is minimally responsive, taking only sips when offered.    I spoke to granddaughter/main decision maker today on the phone.  Family is comfortable with decision to shift to comfort ,Focus on dignity and allow for natural death  Education offered on the natural trajectory and expectations at end-of-life..  Plan of Care: DNR/DNI Comfort, quality and dignity or focus of care No artificial feeding or hydration now or in the future/sips as  tolerated No further diagnostics, lab draws, no IV medications Allow for a natural death  Family is hopeful for residential hospice for EOL care, will write for choice  Prognosis is less  than 2 weeks  Discussed with patient the importance of continued conversation with family and their  medical providers regarding overall plan of care and treatment options,  ensuring decisions are within the context of the patients values and GOCs.  Questions and concerns addressed   Discussed with bedside RN and transition of care team  Total time spent on the unit was 35 minutes  Greater than 50% of the time was spent in counseling and coordination of care  Wadie Lessen NP  Palliative Medicine Team Team Phone # [phone] Pager [phone]

## 2020-12-17 NOTE — TOC Transition Note (Signed)
Transition of Care [facility]) - CM/SW Discharge Note   Patient Details  Name: [name] MRN: [id number] Date of Birth: August 22, 1932  Transition of Care [facility]) CM/SW Contact:  Coralee Pesa, [place] Phone Number: 12/17/2020, 4:34 PM   Clinical Narrative:    Pt to be transported to [facility] via [place]. Nurse to call report to [phone] . Pt has been placed on the transport list.   Final next level of care: [place] Barriers to Discharge: Barriers Resolved   Patient Goals and CMS Choice        Discharge Placement              Patient chooses bed at:  [facility]) Patient to be transferred to facility by: [place] Name of family member notified: Tynisa Patient and family notified of of transfer: 12/17/20  Discharge Plan and Services                                     Social Determinants of Health (SDOH) Interventions     Readmission Risk Interventions Readmission Risk Prevention Plan 04/11/2019  Transportation Screening Complete  PCP or Specialist Appt within 3-5 Days Complete  HRI or Home Care Consult Complete  Social Work Consult for [place] [facility] [place] Planning/Counseling Complete  Palliative Care Screening Not Applicable  Medication Review [employer]) Complete  Some recent data might be hidden

## 2020-12-17 NOTE — Progress Notes (Signed)
[facility] has picked pt. Up to discharge to [facility].

## 2020-12-17 NOTE — Progress Notes (Signed)
Progress Note  Patient Name: [name] Date of Encounter: 12/17/2020  Primary Cardiologist: Sinclair Grooms, MD   Subjective   Patient was seen and examined at her bedside.  She is not responding to questions.  She opened her eyes to voice though.  Inpatient Medications    Scheduled Meds:  latanoprost  1 drop Right Eye QHS   Continuous Infusions:  PRN Meds: HYDROmorphone (DILAUDID) injection, lip balm, LORazepam   Vital Signs    Vitals:   12/16/20 1600 12/16/20 1700 12/17/20 0223 12/17/20 0419  BP: (!) 148/75 (!) 151/76 (!) 154/89 (!) 160/74  Pulse: 90 68 94 80  Resp:    18  Temp:    (!) 97.4 F (36.3 C)  TempSrc:    Oral  SpO2: 100% 100% 100% 99%  Weight:        Intake/Output Summary (Last 24 hours) at 12/17/2020 1257 Last data filed at 12/17/2020 0423 Gross per 24 hour  Intake 17.16 ml  Output 600 ml  Net -582.84 ml   Filed Weights   12/15/20 2100  Weight: 58.4 kg    Telemetry    Sinus rhythm with intermittent PVCs- Personally Reviewed  ECG    No new- Personally Reviewed  Physical Exam   GEN: Frail appearing lady, not responding opens eyes to voice.  No acute distress.   Neck: No JVD Cardiac: RRR, no murmurs, rubs, or gallops.  Respiratory: Clear to auscultation bilaterally. GI: Soft, nontender, non-distended  MS: No edema; No deformity. Neuro:  Nonfocal  Psych: Normal affect   Labs    Chemistry Recent Labs  Lab 12/15/20 0722  NA 146*  K 4.2  CL 108  CO2 17*  GLUCOSE 148*  BUN 136*  CREATININE 4.49*  CALCIUM 9.5  PROT 7.6  ALBUMIN 3.7  AST 434*  ALT 300*  ALKPHOS 53  BILITOT 1.4*  GFRNONAA 9*  ANIONGAP 21*     Hematology Recent Labs  Lab 12/12/20 1536 12/14/20 1846  WBC 8.4 9.0  RBC 3.65* 3.92  HGB 10.6* 11.3*  HCT 32.7* 35.2*  MCV 89.6 89.8  MCH 29.0 28.8  MCHC 32.4 32.1  RDW 16.5* 16.5*  PLT 259 198    Cardiac EnzymesNo results for input(s): TROPONINI in the last 168 hours. No results for input(s):  TROPIPOC in the last 168 hours.   BNP Recent Labs  Lab 12/15/20 1851  BNP >4,500.0*     DDimer No results for input(s): DDIMER in the last 168 hours.   Radiology    CT ABDOMEN PELVIS WO CONTRAST  Result Date: 12/15/2020 CLINICAL DATA:  Abdominal pain and vomiting. EXAM: CT ABDOMEN AND PELVIS WITHOUT CONTRAST TECHNIQUE: Multidetector CT imaging of the abdomen and pelvis was performed following the standard protocol without IV contrast. COMPARISON:  July 10, 2014 FINDINGS: Lower chest: Enlarged heart. Calcific atherosclerotic disease of the aorta and coronary arteries. Bilateral small pleural effusions. Mild interstitial edema. Hepatobiliary: Normal nonenhanced appearance of the liver. Layering gallstones or sludge in the dependent portion of the gallbladder. Pancreas: Unremarkable. No pancreatic ductal dilatation or surrounding inflammatory changes. Spleen: Small spleen. Adrenals/Urinary Tract: No adrenal masses. 1.4 cm hyperdense right renal mass in the lower pole of the right kidney. No evidence of hydronephrosis. Normal urinary bladder. Stomach/Bowel: Stomach is within normal limits. No evidence of appendicitis. No evidence of bowel wall thickening, distention, or inflammatory changes. Vascular/Lymphatic: Heavy atherosclerosis and tortuosity of the aorta and its main branches. No enlarged abdominal or pelvic lymph nodes. Reproductive: 5.7  cm fat density pelvic mass, stable. Other: Fat containing periumbilical anterior abdominal wall hernia. Musculoskeletal: Mild superior endplate compression deformity of T12 vertebral body, stable. IMPRESSION: 1. No evidence of acute abnormalities within the solid abdominal organs. 2. 1.4 cm hyperdense right renal mass in the lower pole of the right kidney. This may represent a hemorrhagic or proteinaceous cyst, however renal cell carcinoma cannot be excluded. 3. Enlarged heart with mild interstitial edema and bilateral small pleural effusions. 4. Anasarca. 5.  Calcific atherosclerotic disease of the aorta and coronary arteries. 6. Fat containing periumbilical anterior abdominal wall hernia. 7. Mild superior endplate compression deformity of T12 vertebral body, stable. Aortic Atherosclerosis (ICD10-I70.0). Electronically Signed   By: Fidela Salisbury M.D.   On: 12/15/2020 13:51   CT HEAD WO CONTRAST (5MM)  Result Date: 12/16/2020 CLINICAL DATA:  Abnormal CT neck with subdural hematoma EXAM: CT HEAD WITHOUT CONTRAST TECHNIQUE: Contiguous axial images were obtained from the base of the skull through the vertex without intravenous contrast. COMPARISON:  May 2021; correlation made with CT neck earlier same day FINDINGS: Brain: There is no acute intracranial hemorrhage. Increased prominence of the extra-axial space along the left cerebral convexity compared to 2021. The isodensity seen on the neck study is likely artifactual reflecting streak from adjacent bone. There is no significant mass effect. No new loss of gray-white differentiation. Patchy hypoattenuation in the supratentorial white matter is nonspecific but probably reflects chronic microvascular ischemic changes. Prominence of the ventricles and sulci reflects parenchymal volume loss. There is prominent dural ossification. Vascular: There is atherosclerotic calcification at the skull base. Skull: Calvarium is unremarkable. Sinuses/Orbits: No acute finding. Other: None. IMPRESSION: Increased prominence of the extra-axial space along the left cerebral convexity compared to 2021. A thin subdural hygroma is not excluded and there is no mass effect. The increased density seen on the neck CT is likely artifactual. No acute intracranial hemorrhage. Chronic microvascular ischemic changes. Electronically Signed   By: Macy Mis M.D.   On: 12/16/2020 12:59   CT SOFT TISSUE NECK WO CONTRAST  Addendum Date: 12/16/2020   ADDENDUM REPORT: 12/16/2020 11:29 ADDENDUM: Study discussed by telephone with Dr. Elliot Gurney on  12/16/2020 at 1123 hours. Electronically Signed   By: Genevie Ann M.D.   On: 12/16/2020 11:29   Result Date: 12/16/2020 CLINICAL DATA:  87 year old female with altered mental status. Failed swallow study. EXAM: CT NECK WITHOUT CONTRAST TECHNIQUE: Multidetector CT imaging of the neck was performed following the standard protocol without intravenous contrast. COMPARISON:  Brain MRI 04/11/2019. FINDINGS: Pharynx and larynx: Mild motion artifact. No laryngeal or pharyngeal mass. Noncontrast parapharyngeal and retropharyngeal spaces remain normal. Salivary glands: Sublingual calcifications appear to be vascular atherosclerosis rather than sialolithiasis. Otherwise negative noncontrast sublingual space. However, both submandibular glands are fatty atrophied and also demonstrate dystrophic calcification. Parotid glands are also fatty atrophied. No parotid sialolithiasis. Thyroid: Diminutive, negative. Lymph nodes: No cervical lymphadenopathy is evident in the absence of IV contrast. Vascular: Generalized arterial tortuosity including the brachiocephalic artery exerting mild mass effect on the trachea at the thoracic inlet. Bulky calcified atherosclerosis at both carotid bifurcations. Additional widespread small vessel calcified atherosclerosis in the neck and visible scalp. Vascular patency is not evaluated in the absence of IV contrast. Limited intracranial: Extensive Calcified atherosclerosis at the skull base. Bulky dural calcification also. Partially visible isodense subdural hematoma in the left hemisphere, proximally 6 mm in thickness on coronal image 48. No definite contralateral right hemisphere subdural. But no midline shift. No ventriculomegaly. Patchy and confluent  cerebral white matter hypodensity. Visualized orbits: Left orbit prosthesis. Rightward gaze of the right globe and orbit soft tissues which appear negative. Mastoids and visualized paranasal sinuses: Small layering fluid level in the left maxillary  sinus. Other Visualized paranasal sinuses and mastoids are stable and well aerated. Skeleton: Mandible motion artifact. Absent dentition. Cervical spine disc and endplate degeneration. No acute osseous abnormality identified. Upper chest: Calcified aortic atherosclerosis. Small layering pleural effusions. Visible major airways are clear. No superior mediastinal lymphadenopathy. IMPRESSION: 1. Partially visible isodense left hemisphere subdural hematoma measuring about 6 mm in thickness. This is new since last year but age indeterminate, and no midline shift is evident. Recommend follow-up noncontrast head CT. 2. No acute finding in the noncontrast neck. Salivary gland atrophy. Submandibular and sublingual sialolithiasis versus small vessel calcified atherosclerosis (see #3). 3. Severe calcified atherosclerosis in the head and neck. Aortic Atherosclerosis (ICD10-I70.0). 4. Small layering pleural effusions. Electronically Signed: By: Genevie Ann M.D. On: 12/16/2020 11:03   US RENAL  Result Date: 12/15/2020 CLINICAL DATA:  1.4 cm hyperdense cyst or mass right kidney on CT today. Hydronephrosis. EXAM: RENAL / URINARY TRACT ULTRASOUND COMPLETE COMPARISON:  CT earlier today, renal ultrasound 04/10/2019. FINDINGS: Right Kidney: Renal measurements: 7.6 x 3.8 x 4.0 cm = volume: 59.9 mL. There is normal cortical echogenicity with mild general cortical thinning. Corresponding to the hyperdense lesion on CT, there is a 1.6 cm simple cyst in the lateral mid pole area. There is no suspicious sonographic mass, no calculus or hydronephrosis is seen. Left Kidney: Renal measurements: 9.0 x 4.6 x 4.5 cm = volume: 95.5 mL. Echogenicity within normal limits. No mass, stone or hydronephrosis visualized. Bladder: Appears normal for degree of bladder distention. Bladder volume is 104.46 mL. Ureteral jets were not seen. Other: No perinephric or pelvic free fluid. IMPRESSION: 1. No increased cortical echogenicity. Mild cortical volume loss  on the right. 2. 1.6 cm simple cyst right kidney corresponding to hyperdense lesion on CT. 3. Unremarkable bladder aside from ureteral jets not being seen. No hydronephrosis. Electronically Signed   By: Telford Nab M.D.   On: 12/15/2020 22:39   DG CHEST PORT 1 VIEW  Result Date: 12/15/2020 CLINICAL DATA:  Increased weakness, poor p.o. intake, shortness of breath for 3 days. EXAM: PORTABLE CHEST 1 VIEW COMPARISON:  Chest x-rays dated 12/12/2020 and 09/18/2020. FINDINGS: Stable cardiomegaly. Lungs are clear. No pleural effusion or pneumothorax is seen. Osseous structures about the chest are unremarkable. IMPRESSION: 1. No active disease. No evidence of pneumonia. 2. Stable cardiomegaly. Electronically Signed   By: Franki Cabot M.D.   On: 12/15/2020 16:44   ECHOCARDIOGRAM COMPLETE  Result Date: 12/16/2020    ECHOCARDIOGRAM REPORT   Patient Name:   [name] Date of Exam: 12/16/2020 Medical Rec #:  [id number]        Height:       60.0 in Accession #:    [id number]       Weight:       128.7 lb Date of Birth:  Oct 18, 1932        BSA:          1.548 m Patient Age:    87 years         BP:           122/104 mmHg Patient Gender: F                HR:           90 bpm. Exam Location:  Inpatient Procedure: 2D Echo, Cardiac Doppler, Color Doppler and 3D Echo Indications:    CHF  History:        Patient has prior history of Echocardiogram examinations, most                 recent 05/22/2020. CHF, CAD, COPD and Stroke, Aortic Valve                 Disease; Risk Factors:Hypertension, Dyslipidemia and Current                 Smoker.  Sonographer:    Merrie Roof RDCS Referring Phys: [place]  1. Left ventricular ejection fraction, by estimation, is 25 to 30%. The left ventricle has severely decreased function. The left ventricle demonstrates global hypokinesis. There is mild concentric left ventricular hypertrophy. Left ventricular diastolic  parameters are consistent with Grade II diastolic  dysfunction (pseudonormalization).  2. Right ventricular systolic function is mildly reduced. The right ventricular size is normal. There is moderately elevated pulmonary artery systolic pressure. The estimated right ventricular systolic pressure is 09.3 mmHg.  3. Left atrial size was severely dilated.  4. The mitral valve is degenerative. Moderate mitral valve regurgitation. Severe mitral annular calcification.  5. Tricuspid valve regurgitation is moderate.  6. The aortic valve is calcified. There is severe calcification most notably on the RCC/NCC of the aortic valve. There is moderate thickening of the aortic valve. Aortic valve regurgitation is moderate. Mild aortic valve stenosis.  7. The inferior vena cava is dilated in size with <50% respiratory variability, suggesting right atrial pressure of 15 mmHg.  8. Evidence of atrial level shunting detected by color flow Doppler. Comparison(s): Compared to prior TTE in 05/2020, the EF has dropped to 25-30% (previously 50-55%) with global hypokinesis, there is now mild RV dysfunction, moderate MR (previously trivial), moderate TR (previously mild), and moderate PHTN (previous PASP 26.52mmHg). FINDINGS  Left Ventricle: Left ventricular ejection fraction, by estimation, is 25 to 30%. The left ventricle has severely decreased function. The left ventricle demonstrates global hypokinesis. 3D left ventricular ejection fraction analysis performed but not reported based on interpreter judgement due to suboptimal tracking. The left ventricular internal cavity size was normal in size. There is mild concentric left ventricular hypertrophy. Left ventricular diastolic parameters are consistent with Grade II diastolic dysfunction (pseudonormalization). Right Ventricle: The right ventricular size is normal. No increase in right ventricular wall thickness. Right ventricular systolic function is mildly reduced. There is moderately elevated pulmonary artery systolic pressure. The  tricuspid regurgitant velocity is 3.23 m/s, and with an assumed right atrial pressure of 15 mmHg, the estimated right ventricular systolic pressure is 23.5 mmHg. Left Atrium: Left atrial size was severely dilated. Right Atrium: Right atrial size was normal in size. Pericardium: There is no evidence of pericardial effusion. Mitral Valve: The mitral valve is degenerative in appearance. There is moderate thickening of the mitral valve leaflet(s). There is moderate calcification of the mitral valve leaflet(s). Severe mitral annular calcification. Moderate mitral valve regurgitation. Tricuspid Valve: The tricuspid valve is normal in structure. Tricuspid valve regurgitation is moderate. Aortic Valve: The aortic valve is calcified. There is severe calcification most notably on the RCC/NCC of the aortic valve. There is moderate thickening of the aortic valve. Aortic valve regurgitation is moderate. Aortic regurgitation PHT measures 478 msec. Mild aortic stenosis is present. Aortic valve mean gradient measures 10.5 mmHg. Aortic valve peak gradient measures 22.8 mmHg. Pulmonic Valve: The pulmonic valve was normal in structure.  Pulmonic valve regurgitation is mild to moderate. Aorta: The aortic root is normal in size and structure. Venous: The inferior vena cava is dilated in size with less than 50% respiratory variability, suggesting right atrial pressure of 15 mmHg. IAS/Shunts: There is right bowing of the interatrial septum, suggestive of elevated left atrial pressure. Evidence of atrial level shunting detected by color flow Doppler.  LEFT VENTRICLE PLAX 2D LVIDd:         4.70 cm LVIDs:         3.90 cm LV PW:         1.30 cm LV IVS:        1.20 cm                             3D Volume EF:                             3D EF:        31 % LV Volumes (MOD)            LV EDV:       109 ml LV vol d, MOD A2C: 72.9 ml  LV ESV:       75 ml LV vol d, MOD A4C: 110.0 ml LV SV:        34 ml LV vol s, MOD A2C: 37.2 ml LV vol s, MOD A4C:  89.9 ml LV SV MOD A2C:     35.7 ml LV SV MOD A4C:     110.0 ml LV SV MOD BP:      34.5 ml RIGHT VENTRICLE            IVC RV Basal diam:  3.80 cm    IVC diam: 2.40 cm RV S prime:     9.36 cm/s TAPSE (M-mode): 1.8 cm LEFT ATRIUM             Index        RIGHT ATRIUM           Index LA diam:        5.00 cm 3.23 cm/m   RA Area:     14.90 cm LA Vol (A2C):   98.0 ml 63.31 ml/m  RA Volume:   34.80 ml  22.48 ml/m LA Vol (A4C):   81.6 ml 52.72 ml/m LA Biplane Vol: 94.0 ml 60.73 ml/m  AORTIC VALVE AV Vmax:           238.50 cm/s AV Vmean:          148.000 cm/s AV VTI:            0.349 m AV Peak Grad:      22.8 mmHg AV Mean Grad:      10.5 mmHg LVOT Vmax:         108.00 cm/s LVOT Vmean:        58.200 cm/s LVOT VTI:          0.127 m LVOT/AV VTI ratio: 0.36 AI PHT:            478 msec  AORTA Ao Root diam: 3.20 cm MITRAL VALVE                  TRICUSPID VALVE MV Area (PHT): 5.66 cm       TR Peak grad:   41.7 mmHg MV Decel Time: 134 msec  TR Vmax:        323.00 cm/s MR Peak grad:    100.0 mmHg MR Mean grad:    72.0 mmHg    SHUNTS MR Vmax:         500.00 cm/s  Systemic VTI: 0.13 m MR Vmean:        394.0 cm/s MR PISA:         1.01 cm MR PISA Eff ROA: 7 mm MR PISA Radius:  0.40 cm MV E velocity: 117.00 cm/s MV A velocity: 92.20 cm/s MV E/A ratio:  1.27 Gwyndolyn Kaufman MD Electronically signed by Gwyndolyn Kaufman MD Signature Date/Time: 12/16/2020/12:25:21 PM    Final    [place] EKG SITE RITE  Result Date: 12/16/2020 If Site Rite image not attached, placement could not be confirmed due to current cardiac rhythm.   Cardiac Studies   TTE 12/16/2020 IMPRESSIONS     1. Left ventricular ejection fraction, by estimation, is 25 to 30%. The  left ventricle has severely decreased function. The left ventricle  demonstrates global hypokinesis. There is mild concentric left ventricular  hypertrophy. Left ventricular diastolic   parameters are consistent with Grade II diastolic dysfunction  (pseudonormalization).   2.  Right ventricular systolic function is mildly reduced. The right  ventricular size is normal. There is moderately elevated pulmonary artery  systolic pressure. The estimated right ventricular systolic pressure is  40.3 mmHg.   3. Left atrial size was severely dilated.   4. The mitral valve is degenerative. Moderate mitral valve regurgitation.  Severe mitral annular calcification.   5. Tricuspid valve regurgitation is moderate.   6. The aortic valve is calcified. There is severe calcification most  notably on the RCC/NCC of the aortic valve. There is moderate thickening  of the aortic valve. Aortic valve regurgitation is moderate. Mild aortic  valve stenosis.   7. The inferior vena cava is dilated in size with <50% respiratory  variability, suggesting right atrial pressure of 15 mmHg.   8. Evidence of atrial level shunting detected by color flow Doppler.   Comparison(s): Compared to prior TTE in 05/2020, the EF has dropped to  25-30% (previously 50-55%) with global hypokinesis, there is now mild RV  dysfunction, moderate MR (previously trivial), moderate TR (previously  mild), and moderate PHTN (previous PASP  26.74mmHg).   FINDINGS   Left Ventricle: Left ventricular ejection fraction, by estimation, is 25  to 30%. The left ventricle has severely decreased function. The left  ventricle demonstrates global hypokinesis. 3D left ventricular ejection  fraction analysis performed but not  reported based on interpreter judgement due to suboptimal tracking. The  left ventricular internal cavity size was normal in size. There is mild  concentric left ventricular hypertrophy. Left ventricular diastolic  parameters are consistent with Grade II  diastolic dysfunction (pseudonormalization).   Right Ventricle: The right ventricular size is normal. No increase in  right ventricular wall thickness. Right ventricular systolic function is  mildly reduced. There is moderately elevated pulmonary artery  systolic  pressure. The tricuspid regurgitant  velocity is 3.23 m/s, and with an assumed right atrial pressure of 15  mmHg, the estimated right ventricular systolic pressure is 47.4 mmHg.   Left Atrium: Left atrial size was severely dilated.   Right Atrium: Right atrial size was normal in size.   Pericardium: There is no evidence of pericardial effusion.   Mitral Valve: The mitral valve is degenerative in appearance. There is  moderate thickening of the mitral valve leaflet(s). There is moderate  calcification of the mitral valve leaflet(s). Severe mitral annular  calcification. Moderate mitral valve  regurgitation.   Tricuspid Valve: The tricuspid valve is normal in structure. Tricuspid  valve regurgitation is moderate.   Aortic Valve: The aortic valve is calcified. There is severe calcification  most notably on the RCC/NCC of the aortic valve. There is moderate  thickening of the aortic valve. Aortic valve regurgitation is moderate.  Aortic regurgitation PHT measures 478  msec. Mild aortic stenosis is present. Aortic valve mean gradient measures  10.5 mmHg. Aortic valve peak gradient measures 22.8 mmHg.   Pulmonic Valve: The pulmonic valve was normal in structure. Pulmonic valve  regurgitation is mild to moderate.   Aorta: The aortic root is normal in size and structure.   Venous: The inferior vena cava is dilated in size with less than 50%  respiratory variability, suggesting right atrial pressure of 15 mmHg.   IAS/Shunts: There is right bowing of the interatrial septum, suggestive of  elevated left atrial pressure. Evidence of atrial level shunting detected  by color flow Doppler.      Result date: 05/22/20  1. Left ventricular ejection fraction, by estimation, is 50 to 55%. Left  ventricular ejection fraction by 3D volume is 49 %. The left ventricle has  low normal function. The left ventricle demonstrates regional wall motion  abnormalities (see scoring   diagram/findings for description). Left ventricular diastolic parameters  are consistent with Grade I diastolic dysfunction (impaired relaxation).  Elevated left atrial pressure. There is mild hypokinesis of the left  ventricular, basal inferoseptal wall,  inferior wall and inferolateral wall.   2. Right ventricular systolic function is normal. The right ventricular  size is normal. There is normal pulmonary artery systolic pressure.   3. Left atrial size was mildly dilated.   4. The mitral valve is normal in structure. Trivial mitral valve  regurgitation. No evidence of mitral stenosis. Moderate mitral annular  calcification.   5. The aortic valve is tricuspid. There is mild calcification of the  aortic valve. There is moderate thickening of the aortic valve. Aortic  valve regurgitation is mild. Mild aortic valve stenosis.   6. The inferior vena cava is dilated in size with <50% respiratory  variability, suggesting right atrial pressure of 15 mmHg.   Comparison(s): No significant change from prior study. Prior images  reviewed side by side.    LHC 04/08/2019 Ost RCA to Mid RCA lesion is 100% stenosed. Prox Cx lesion is 30% stenosed. Mid LAD lesion is 30% stenosed.    Patient Profile     87 y.o. female CAD, HTN, HLD, CKD stage 3, DMII who presented with weakness, poor PO intake and SOB in the setting of diarrhea found to have elevated trop 392>302, lactate 5, BNP >4500. Cardiology was consulted for concern for elevated troponin. Course complicated by newly reduced LVEF by bedside TTE (20%) with concern for low output shock now on dobutamine with improvement. Of note, patient is DNR with no further escalation of care.  Assessment & Plan   Heart failure with reduced ejection fraction Suspected stress cardiomyopathy with concern for low output heart failure Elevated troponin Coronary artery disease-previously deemed not an invasive candidate Acute on chronic renal  failure Transaminitis Failure to thrive  Initial presentation with multiorgan failure which included AKI suspected to be cardiorenal, lactic acidosis, transaminitis and elevated troponin.  She was treated with dobutamine during her initial stages of her hospitalization which is now being weaned off.  Multiple discussion for care  with shared decision based on family input as well as medical team previously noted no escalation of care with any invasive procedures and medical therapy continuation.  With her conservative medical management palliative care/hospice were consulted family has agreed on hospice care which I do not think is unreasonable.  Given the patient clinical status.   [facility] will sign off.   Medication Recommendations: Allowable per hospice Other recommendations (labs, testing, etc): Allowable per hospice Follow up as an outpatient:  None  For questions or updates, please contact [place] Please consult [email or web address] for contact info under Cardiology/STEMI.      Rolly Pancake, DO  12/17/2020, 12:57 PM

## 2020-12-17 NOTE — Discharge Summary (Signed)
Name: [name] MRN: [id number] DOB: May 21, 1932 87 y.o. PCP: Dierdre Harness, FNP  Date of Admission: 12/14/2020  6:12 PM Date of Discharge: 12/17/2020 Attending Physician: Dr. Evette Doffing  Discharge Diagnosis: Principal Problem:   Acute hypoxemic respiratory failure [facility]) Active Problems:   Pressure injury of skin    Discharge Medications: Allergies as of 12/17/2020       Reactions   Tape Other (See Comments)   SKIN IS SENSITIVE        Medication List     STOP taking these medications    acetaminophen 325 MG tablet Commonly known as: TYLENOL   albuterol 108 (90 Base) MCG/ACT inhaler Commonly known as: VENTOLIN HFA   amLODipine 10 MG tablet Commonly known as: NORVASC   atorvastatin 40 MG tablet Commonly known as: LIPITOR   carvedilol 12.5 MG tablet Commonly known as: COREG   clopidogrel 75 MG tablet Commonly known as: PLAVIX   folic acid 1 MG tablet Commonly known as: FOLVITE   furosemide 40 MG tablet Commonly known as: LASIX   gabapentin 300 MG capsule Commonly known as: NEURONTIN   guaiFENesin 600 MG 12 hr tablet Commonly known as: MUCINEX   hydrALAZINE 10 MG tablet Commonly known as: APRESOLINE   isosorbide mononitrate 60 MG 24 hr tablet Commonly known as: IMDUR   neomycin-polymyxin-hydrocortisone 3.5-10000-1 OTIC suspension Commonly known as: CORTISPORIN   nicotine 7 mg/24hr patch Commonly known as: NICODERM CQ - dosed in mg/24 hr   nitroGLYCERIN 0.4 MG SL tablet Commonly known as: NITROSTAT   omeprazole 20 MG capsule Commonly known as: PRILOSEC   One-A-Day Womens 50+ Tabs       TAKE these medications    HYDROmorphone 1 MG/ML injection Commonly known as: DILAUDID Inject 0.5 mLs (0.5 mg total) into the vein every 2 (two) hours as needed for moderate pain (dyspnea).   latanoprost 0.005 % ophthalmic solution Commonly known as: XALATAN Place 1 drop into the right eye at bedtime.   lip balm Oint Apply 1 application  topically as needed for lip care.   LORazepam 2 MG/ML injection Commonly known as: ATIVAN Inject 0.5 mLs (1 mg total) into the vein every 4 (four) hours as needed for anxiety.               Discharge Care Instructions  (From admission, onward)           Start     Ordered   12/17/20 0000  Change dressing (specify)       Comments: Keep wounds clean dry and intact.   12/17/20 1425            Disposition and follow-up:   [name] was discharged from [facility] in stable condition.  At the hospital follow up visit please address:  1.  Follow-up: none   2.  Labs / imaging needed at time of follow-up: none  3.  Pending labs/ test needing follow-up: none  4.  Medication Changes  Started: dilaudid 35m Q2hrs, ativan 229mQ4hrs, xalatan QHS, blistex PRN  Stopped: amlodipine, atorvastatin, carvedilol, furosemide, hydralazine, imdur, nitroglycerin, prilosec, plavix, gabapentin    Changed: none  Abx -  none End Date: none  Follow-up Appointments: none  Hospital Course by problem list:  Stress induced cardiomyopathy Patient presented with complaints of altered mental status and diarrheal illness. During evaluation in the emergency department, she was noted to have elevation in liver enzymes, creatinine, and lactic acid indicating multiorgan failure. Nephrology was consulted, but she was  not elt to be a candidate for HD due to poor hemodynamics and multiple comordidities.  Elevation in BNP and troponins indicated heart failure. Cardiology was consulted for assistance and bedside echo was performed which indicated acute decompensation of heart function likely the cause of her multiorgan failure. She was started on dobutamine with improvement in her lactic acid, however, additional labs were unable to be drawn to further evaluate organ status. PICC team was consulted. Palliative care was consulted due to guarded prognosis, however, family met with  palliative to discuss goals of care and opted for comfort measures, which were began while in the hospital. She was accepted to [facility] for end of life care.        Discharge Subjective: Patient nonverbal but does appear to be uncomfortable today. She nods head yes that she is in pain and gestures toward abdomen.   Discharge Exam:   BP (!) 160/74 (BP Location: Left Arm)   Pulse 80   Temp (!) 97.4 F (36.3 C) (Oral)   Resp 18   Wt 58.4 kg   SpO2 99%   BMI 25.14 kg/m  Constitutional:      General: appears uncomftable    Appearance: She is obese.  She is not toxic-appearing or diaphoretic.  HENT:     Head: Atraumatic.     Mouth/Throat:     Mouth: Mucous membranes are dry.     Pharynx: Oropharynx is clear.     Comments: edentulous Eyes:     Comments: Prosthetic left eye  Neck:     Comments: Firm symmetric submandibular mass Cardiovascular:     Rate and Rhythm: Normal rate and regular rhythm.     Heart sounds:    No gallop.     Comments: Distal pulses lower extremities decreased Pulmonary:     Breath sounds: Normal breath sounds. No wheezing or rhonchi.  Abdominal:     General: Abdomen is flat. Bowel sounds are normal. There is no distension.     Palpations: Abdomen is soft. There is no mass.     Tenderness: There is abdominal tenderness. There is no guarding.     Hernia: No hernia is present.  Musculoskeletal:        General: No swelling, tenderness, deformity or signs of injury.     Cervical back: Normal range of motion and neck supple.  Skin:    General: Skin is dry.     Findings: No bruising or erythema.     Comments: cool  Neurological:     General: No focal deficit present.     Mental Status: She is disoriented.  Psychiatric:     Comments: Unable to assess   Pertinent Labs, Studies, and Procedures:  CBC Latest Ref Rng & Units 12/14/2020 12/12/2020 07/13/2020  WBC 4.0 - 10.5 K/uL 9.0 8.4 6.9  Hemoglobin 12.0 - 15.0 g/dL 11.3(L) 10.6(L) 11.3(L)  Hematocrit  36.0 - 46.0 % 35.2(L) 32.7(L) 36.4  Platelets 150 - 400 K/uL 198 259 267    CMP Latest Ref Rng & Units 12/15/2020 07/13/2020 05/24/2020  Glucose 70 - 99 mg/dL 148(H) 119(H) 92  BUN 8 - 23 mg/dL 136(H) 28(H) 26(H)  Creatinine 0.44 - 1.00 mg/dL 4.49(H) 2.02(H) 2.41(H)  Sodium 135 - 145 mmol/L 146(H) 140 133(L)  Potassium 3.5 - 5.1 mmol/L 4.2 4.0 3.7  Chloride 98 - 111 mmol/L 108 104 102  CO2 22 - 32 mmol/L 17(L) 28 23  Calcium 8.9 - 10.3 mg/dL 9.5 9.3 8.6(L)  Total Protein 6.5 -  8.1 g/dL 7.6 7.0 -  Total Bilirubin 0.3 - 1.2 mg/dL 1.4(H) 0.5 -  Alkaline Phos 38 - 126 U/L 53 43 -  AST 15 - 41 U/L 434(H) 41 -  ALT 0 - 44 U/L 300(H) 18 -    CT ABDOMEN PELVIS WO CONTRAST  Result Date: 12/15/2020 CLINICAL DATA:  Abdominal pain and vomiting. EXAM: CT ABDOMEN AND PELVIS WITHOUT CONTRAST TECHNIQUE: Multidetector CT imaging of the abdomen and pelvis was performed following the standard protocol without IV contrast. COMPARISON:  July 10, 2014 FINDINGS: Lower chest: Enlarged heart. Calcific atherosclerotic disease of the aorta and coronary arteries. Bilateral small pleural effusions. Mild interstitial edema. Hepatobiliary: Normal nonenhanced appearance of the liver. Layering gallstones or sludge in the dependent portion of the gallbladder. Pancreas: Unremarkable. No pancreatic ductal dilatation or surrounding inflammatory changes. Spleen: Small spleen. Adrenals/Urinary Tract: No adrenal masses. 1.4 cm hyperdense right renal mass in the lower pole of the right kidney. No evidence of hydronephrosis. Normal urinary bladder. Stomach/Bowel: Stomach is within normal limits. No evidence of appendicitis. No evidence of bowel wall thickening, distention, or inflammatory changes. Vascular/Lymphatic: Heavy atherosclerosis and tortuosity of the aorta and its main branches. No enlarged abdominal or pelvic lymph nodes. Reproductive: 5.7 cm fat density pelvic mass, stable. Other: Fat containing periumbilical anterior abdominal  wall hernia. Musculoskeletal: Mild superior endplate compression deformity of T12 vertebral body, stable. IMPRESSION: 1. No evidence of acute abnormalities within the solid abdominal organs. 2. 1.4 cm hyperdense right renal mass in the lower pole of the right kidney. This may represent a hemorrhagic or proteinaceous cyst, however renal cell carcinoma cannot be excluded. 3. Enlarged heart with mild interstitial edema and bilateral small pleural effusions. 4. Anasarca. 5. Calcific atherosclerotic disease of the aorta and coronary arteries. 6. Fat containing periumbilical anterior abdominal wall hernia. 7. Mild superior endplate compression deformity of T12 vertebral body, stable. Aortic Atherosclerosis (ICD10-I70.0). Electronically Signed   By: Fidela Salisbury M.D.   On: 12/15/2020 13:51   US RENAL  Result Date: 12/15/2020 CLINICAL DATA:  1.4 cm hyperdense cyst or mass right kidney on CT today. Hydronephrosis. EXAM: RENAL / URINARY TRACT ULTRASOUND COMPLETE COMPARISON:  CT earlier today, renal ultrasound 04/10/2019. FINDINGS: Right Kidney: Renal measurements: 7.6 x 3.8 x 4.0 cm = volume: 59.9 mL. There is normal cortical echogenicity with mild general cortical thinning. Corresponding to the hyperdense lesion on CT, there is a 1.6 cm simple cyst in the lateral mid pole area. There is no suspicious sonographic mass, no calculus or hydronephrosis is seen. Left Kidney: Renal measurements: 9.0 x 4.6 x 4.5 cm = volume: 95.5 mL. Echogenicity within normal limits. No mass, stone or hydronephrosis visualized. Bladder: Appears normal for degree of bladder distention. Bladder volume is 104.46 mL. Ureteral jets were not seen. Other: No perinephric or pelvic free fluid. IMPRESSION: 1. No increased cortical echogenicity. Mild cortical volume loss on the right. 2. 1.6 cm simple cyst right kidney corresponding to hyperdense lesion on CT. 3. Unremarkable bladder aside from ureteral jets not being seen. No hydronephrosis.  Electronically Signed   By: Telford Nab M.D.   On: 12/15/2020 22:39   DG CHEST PORT 1 VIEW  Result Date: 12/15/2020 CLINICAL DATA:  Increased weakness, poor p.o. intake, shortness of breath for 3 days. EXAM: PORTABLE CHEST 1 VIEW COMPARISON:  Chest x-rays dated 12/12/2020 and 09/18/2020. FINDINGS: Stable cardiomegaly. Lungs are clear. No pleural effusion or pneumothorax is seen. Osseous structures about the chest are unremarkable. IMPRESSION: 1. No active disease. No  evidence of pneumonia. 2. Stable cardiomegaly. Electronically Signed   By: Franki Cabot M.D.   On: 12/15/2020 16:44     Discharge Instructions: Discharge Instructions     Change dressing (specify)   Complete by: As directed    Keep wounds clean dry and intact.   Diet general   Complete by: As directed    No wound care   Complete by: As directed      - DNR - Comfort, quality and dignity focus of care. No artificial feeding or hydration now or in the future/sips as  tolerated No further diagnostics, lab draws, no IV medications Allow for a natural death   Signed: Delene Ruffini, MD 12/17/2020, 2:33 PM   Pager: [phone]

## 2020-12-17 NOTE — Progress Notes (Signed)
Pt is to be discharged from [facility] to [facility] via [facility]. Report was called to Alvia Grove at the facility. AVS has been printed and available for PTAR once they arrive. IV was left in place as directed by facility. Pt will be receiving IV dilaudid and ativan.   Raelyn Number, RN 12/17/20 6:18 PM

## 2020-12-17 NOTE — Progress Notes (Signed)
[facility]) Hospital Liaison Note  Received request from [place] for family interest in [facility] with request for transfer. Chart reviewed and eligibility confirmed. Spoke with Tynisa(granddaughter) [phone] to confirm interest and explain services. Family agreeable to transfer today. TOC aware. Unit RN please call report to [phone] prior to patient leaving the unit.   TOC can arrange transport once confirmation has been received that consents are complete.   Please call with any questions or concerns. Thank you  Roselee Nova, [facility] Liaison  [phone]

## 2020-12-17 NOTE — TOC Progression Note (Signed)
Transition of Care [facility]) - Progression Note    Patient Details  Name: [name] MRN: [id number] Date of Birth: [birth year]  Transition of Care [facility]) CM/SW [place], [place] Phone Number: 12/17/2020, 11:31 AM  Clinical Narrative:    CSW was notified by Palliative NP that pt's family would like to pursue hospice care at [facility]. CSW spoke with granddaughter and offered choice, Granddaughter confirmed their interest in [facility], Referral was made to [facility] ([place]). TOC will continue to follow for DC needs.        Expected Discharge Plan and Services                                                 Social Determinants of Health (SDOH) Interventions    Readmission Risk Interventions Readmission Risk Prevention Plan 04/11/2019  Transportation Screening Complete  PCP or Specialist Appt within 3-5 Days Complete  HRI or [place] Complete  Social Work Consult for [place] Planning/Counseling Complete  Palliative Care Screening Not Applicable  Medication Review [employer]) Complete  Some recent data might be hidden

## 2020-12-20 LAB — CULTURE, BLOOD (ROUTINE X 2): Culture: NO GROWTH

## 2021-01-13 DEATH — deceased

## 2021-02-07 ENCOUNTER — Other Ambulatory Visit: Payer: Medicare Other

## 2021-08-05 IMAGING — RF DG ESOPHAGUS
6 of 7 series · 13 of 16 positions shown · non-contrast
Comparison: None.

CLINICAL DATA: Stroke. Throat cancer with radiation. Chronic cough.
Dysphagia. Chronic coarseness.

EXAM:
ESOPHOGRAM/BARIUM SWALLOW
TECHNIQUE: Single contrast examination was performed using thin barium and
barium tablet.
FLUOROSCOPY TIME:  Fluoroscopy Time:  2 minutes 42 second
Radiation Exposure Index (if provided by the fluoroscopic device):
Number of Acquired Spot Images: 0

[Series 1: cp_standard · 0.34mm/px · 3 of 293 frames shown (1 of 6)]
[frame 44/293]
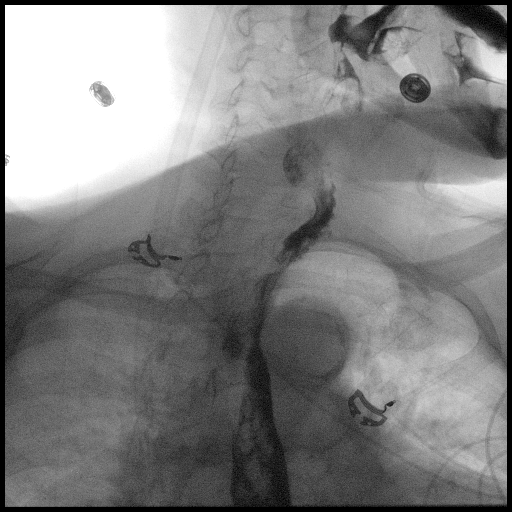
[frame 147/293]
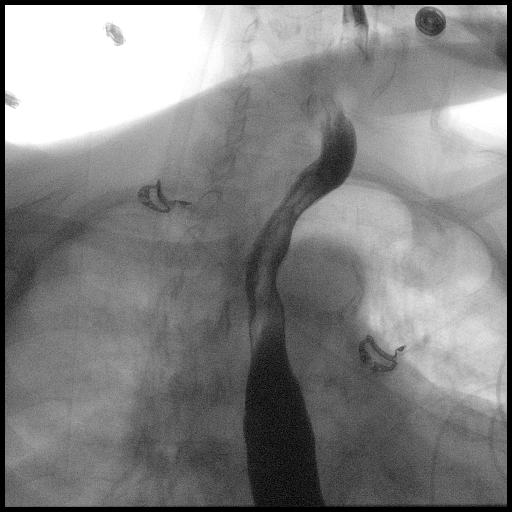
[frame 285/293]
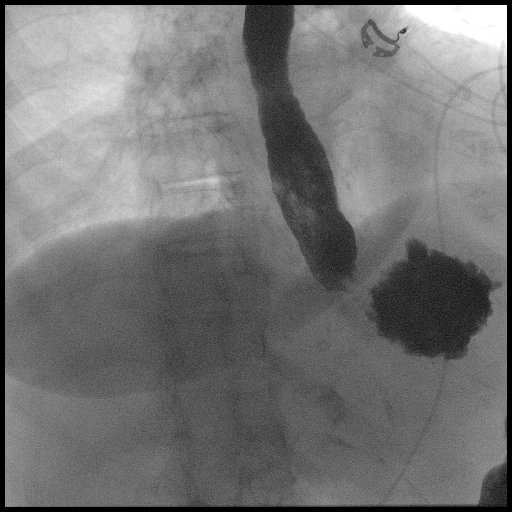

[Series 2: cp_standard · 0.34mm/px · 3 of 237 frames shown (2 of 6)]
[frame 36/237]
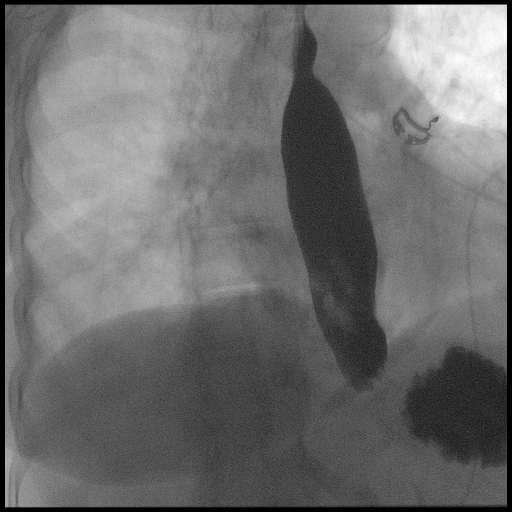
[frame 107/237]
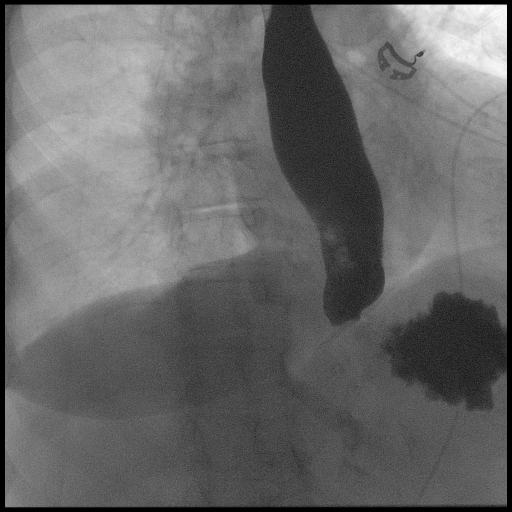
[frame 119/237]
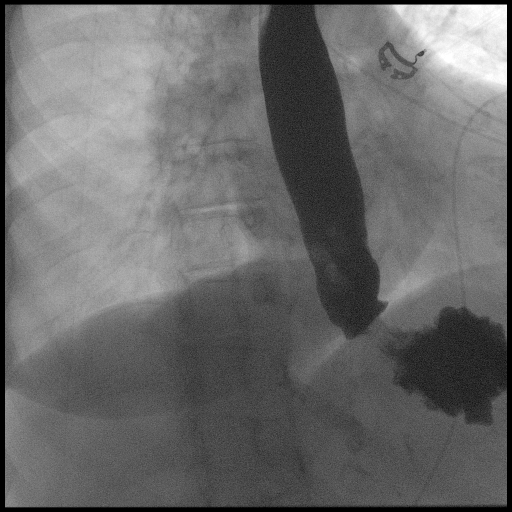

[Series 3: cp_standard · 0.34mm/px · 4 of 179 frames shown (3 of 6)]
[frame 27/179]
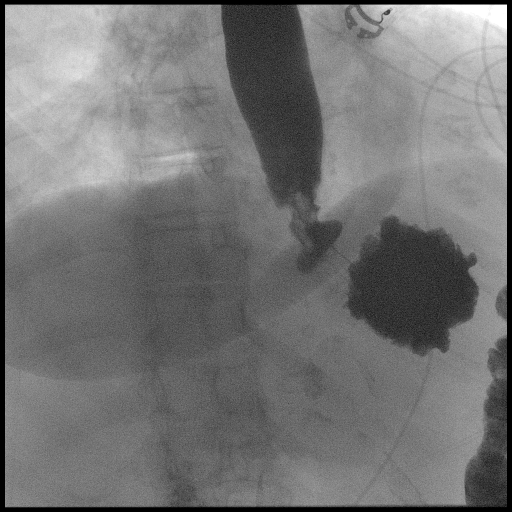
[frame 90/179]
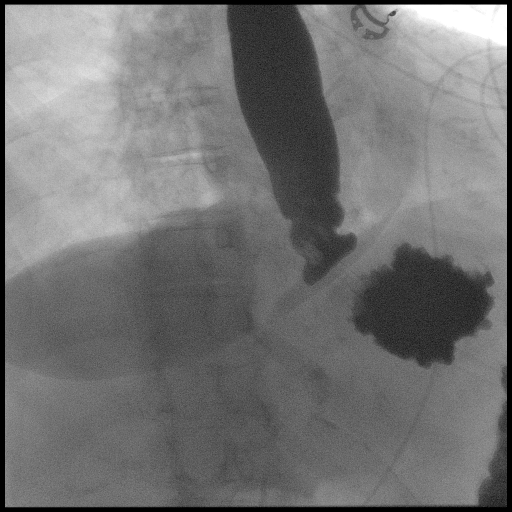
[frame 153/179]
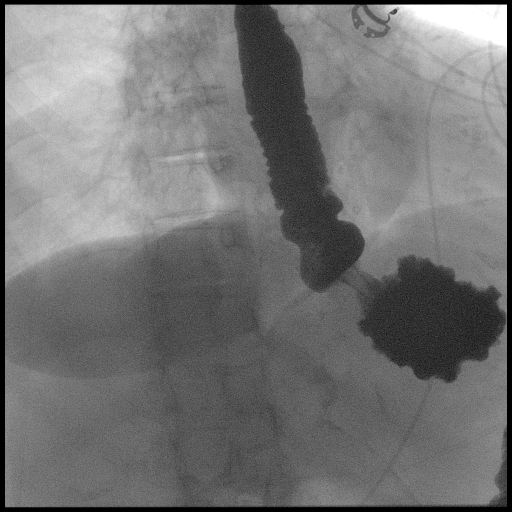
[frame 179/179]
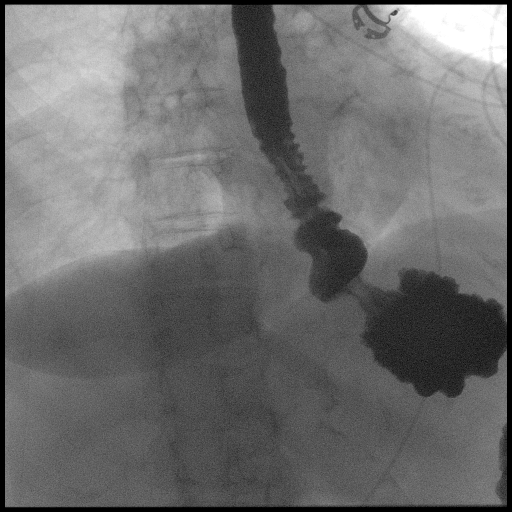

[Series 4: cp_standard · 0.17mm/px · 1 of 1 slices shown (4 of 6)]
[im 1/1]
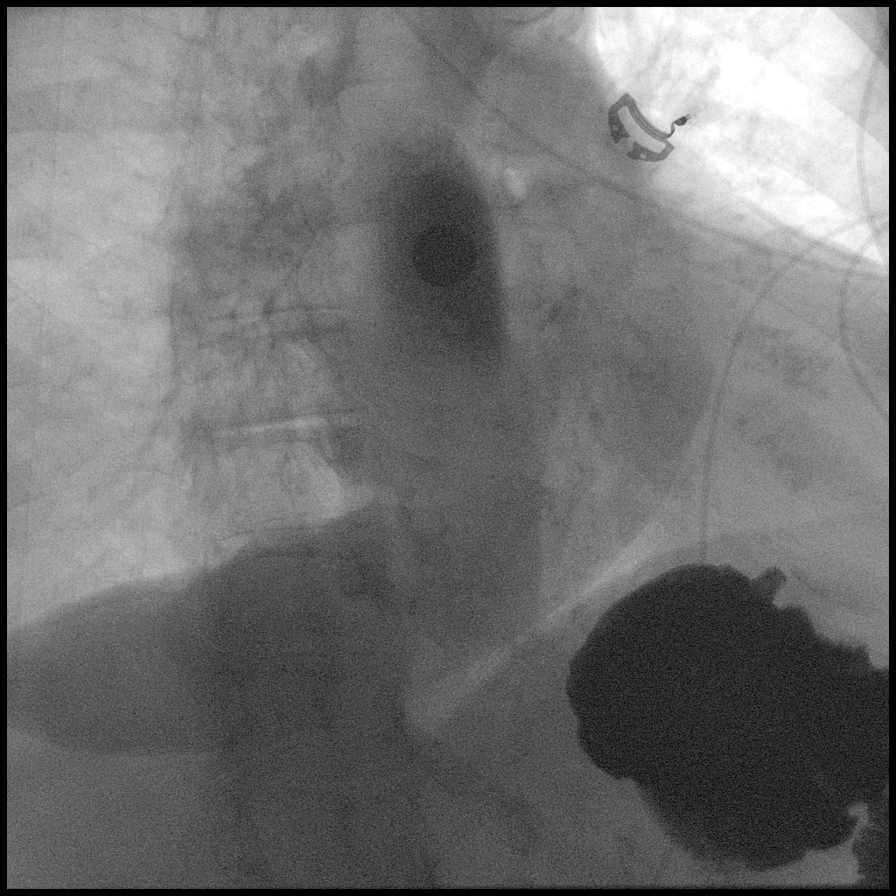

[Series 6: cp_standard · 0.17mm/px · 1 of 1 slices shown (5 of 6)]
[im 1/1]
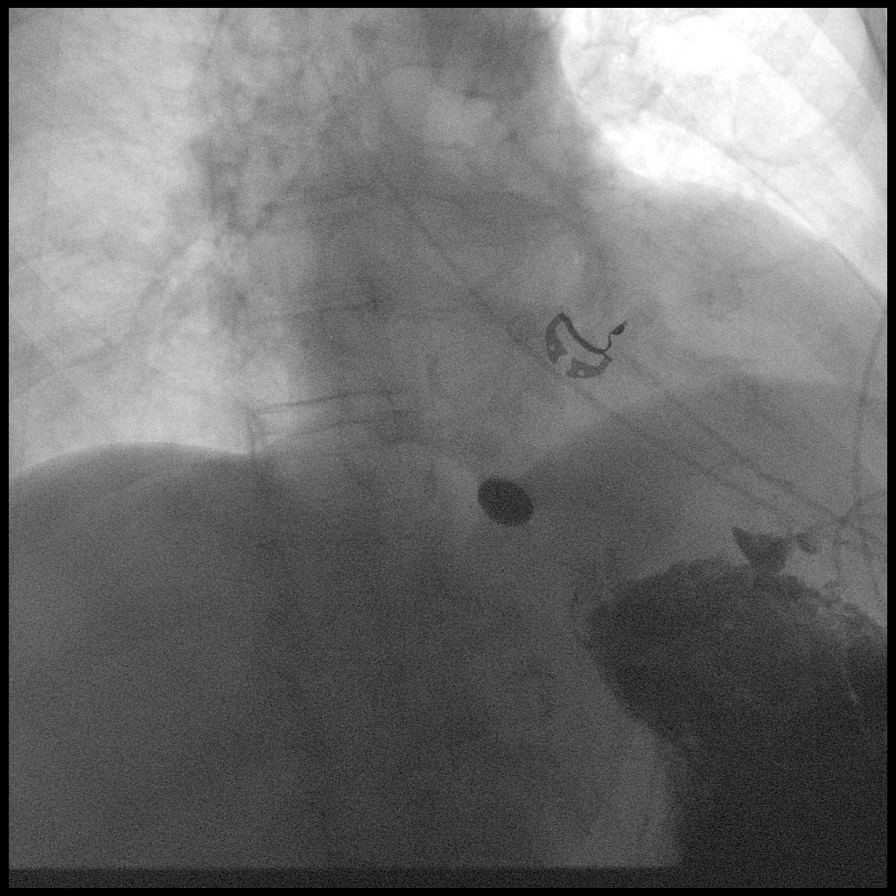

[Series 7: cp_standard · 0.17mm/px · 1 of 1 slices shown (6 of 6)]
[im 1/1]
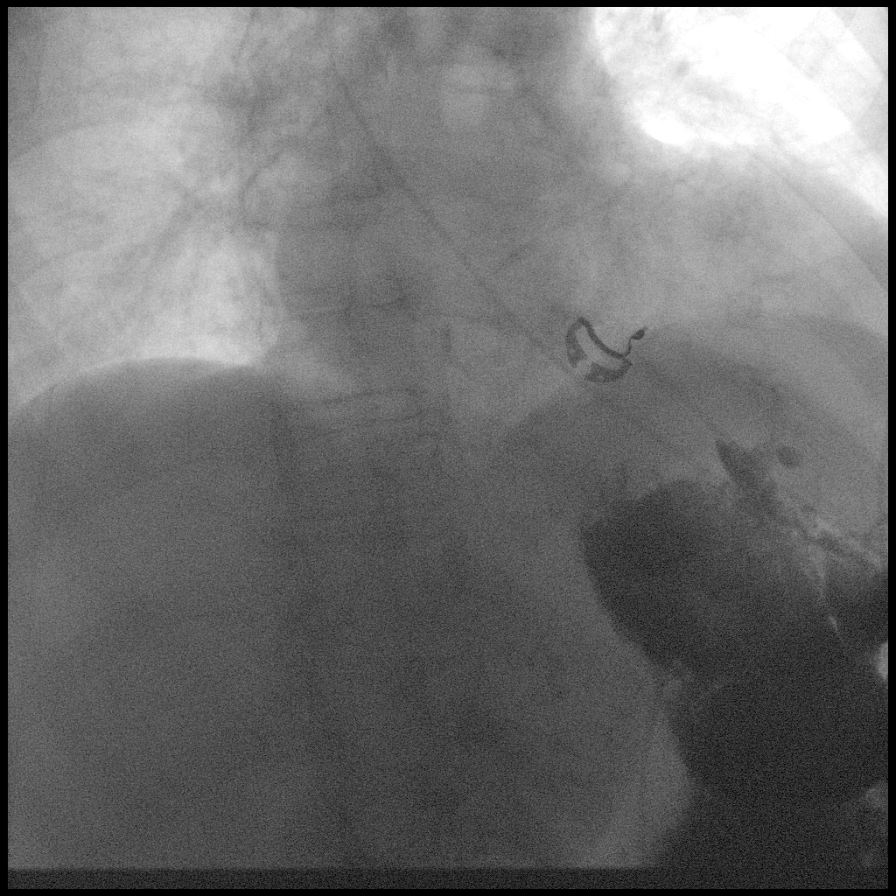

[13 of 16 positions shown; findings below may reference images not displayed]

FINDINGS: Decreased esophageal motility. No aspiration. No esophageal
stricture or mass lesion.

Barium tablet passed into the stomach without obstruction.
IMPRESSION: Poor esophageal motility.  No stricture or mass.

Barium tablet passed into the stomach.
